# Patient Record
Sex: Male | Born: 1945
Health system: Southern US, Community
[De-identification: ages and names within clinical notes are randomized; demographics above are authoritative.]

## PROBLEM LIST (undated history)

## (undated) ENCOUNTER — Telehealth

## (undated) ENCOUNTER — Encounter: Attending: Internal Medicine | Primary: Internal Medicine

## (undated) ENCOUNTER — Ambulatory Visit: Payer: MEDICARE | Attending: Radiation Oncology | Primary: Radiation Oncology

## (undated) ENCOUNTER — Ambulatory Visit: Payer: MEDICARE

## (undated) ENCOUNTER — Ambulatory Visit: Payer: MEDICARE | Attending: Nurse Practitioner | Primary: Nurse Practitioner

## (undated) ENCOUNTER — Telehealth: Attending: Internal Medicine | Primary: Internal Medicine

## (undated) ENCOUNTER — Encounter

## (undated) ENCOUNTER — Ambulatory Visit: Payer: Medicare (Managed Care)

## (undated) ENCOUNTER — Ambulatory Visit

## (undated) ENCOUNTER — Ambulatory Visit: Payer: MEDICAID

## (undated) ENCOUNTER — Encounter: Payer: MEDICARE | Attending: Internal Medicine | Primary: Internal Medicine

## (undated) ENCOUNTER — Encounter: Attending: Gastroenterology | Primary: Gastroenterology

## (undated) ENCOUNTER — Encounter: Attending: Radiation Oncology | Primary: Radiation Oncology

## (undated) ENCOUNTER — Ambulatory Visit: Payer: Medicare (Managed Care) | Attending: Internal Medicine | Primary: Internal Medicine

## (undated) ENCOUNTER — Ambulatory Visit: Attending: Radiation Oncology | Primary: Radiation Oncology

## (undated) ENCOUNTER — Telehealth: Attending: Radiation Oncology | Primary: Radiation Oncology

## (undated) ENCOUNTER — Other Ambulatory Visit

## (undated) ENCOUNTER — Ambulatory Visit: Attending: Orthopaedic Surgery | Primary: Orthopaedic Surgery

## (undated) ENCOUNTER — Inpatient Hospital Stay: Payer: Medicare (Managed Care)

## (undated) ENCOUNTER — Ambulatory Visit: Payer: MEDICARE | Attending: Medical | Primary: Medical

## (undated) ENCOUNTER — Encounter: Payer: MEDICARE | Attending: Orthopaedic Surgery | Primary: Orthopaedic Surgery

## (undated) ENCOUNTER — Encounter: Attending: Orthopaedic Surgery | Primary: Orthopaedic Surgery

## (undated) ENCOUNTER — Ambulatory Visit: Payer: MEDICARE | Attending: Internal Medicine | Primary: Internal Medicine

## (undated) ENCOUNTER — Telehealth: Attending: Orthopaedic Surgery | Primary: Orthopaedic Surgery

## (undated) ENCOUNTER — Ambulatory Visit: Payer: MEDICAID | Attending: Internal Medicine | Primary: Internal Medicine

## (undated) DIAGNOSIS — M1990 Unspecified osteoarthritis, unspecified site: Secondary | ICD-10-CM

## (undated) DIAGNOSIS — J449 Chronic obstructive pulmonary disease, unspecified: Secondary | ICD-10-CM

## (undated) DIAGNOSIS — Z86010 Personal history of colonic polyps: Secondary | ICD-10-CM

## (undated) DIAGNOSIS — K219 Gastro-esophageal reflux disease without esophagitis: Secondary | ICD-10-CM

## (undated) DIAGNOSIS — Z85118 Personal history of other malignant neoplasm of bronchus and lung: Secondary | ICD-10-CM

## (undated) DIAGNOSIS — I10 Essential (primary) hypertension: Secondary | ICD-10-CM

## (undated) DIAGNOSIS — M79603 Pain in arm, unspecified: Secondary | ICD-10-CM

## (undated) DIAGNOSIS — N189 Chronic kidney disease, unspecified: Secondary | ICD-10-CM

## (undated) DIAGNOSIS — R51 Headache: Secondary | ICD-10-CM

## (undated) DIAGNOSIS — N200 Calculus of kidney: Secondary | ICD-10-CM

## (undated) HISTORY — PX: CHOLECYSTECTOMY: SHX55

## (undated) HISTORY — PX: LOBECTOMY: SHX5089

## (undated) HISTORY — PX: CATARACT EXTRACTION: SUR2

## (undated) HISTORY — PX: HERNIA REPAIR: SHX51

## (undated) HISTORY — PX: COLONOSCOPY: SHX174

---
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
## 2009-03-13 HISTORY — PX: CYSTOSCOPY W/ URETERAL STENT PLACEMENT: SHX1429

## 2010-02-15 ENCOUNTER — Emergency Department: Payer: Self-pay | Admitting: Internal Medicine

## 2010-10-13 ENCOUNTER — Ambulatory Visit: Payer: Self-pay

## 2010-10-15 ENCOUNTER — Emergency Department: Payer: Self-pay | Admitting: Emergency Medicine

## 2010-11-11 ENCOUNTER — Ambulatory Visit: Payer: Self-pay | Admitting: Internal Medicine

## 2010-12-20 ENCOUNTER — Other Ambulatory Visit: Payer: Self-pay | Admitting: Neurosurgery

## 2010-12-20 DIAGNOSIS — M5020 Other cervical disc displacement, unspecified cervical region: Secondary | ICD-10-CM

## 2010-12-20 DIAGNOSIS — M5410 Radiculopathy, site unspecified: Secondary | ICD-10-CM

## 2010-12-21 ENCOUNTER — Ambulatory Visit
Admission: RE | Admit: 2010-12-21 | Discharge: 2010-12-21 | Disposition: A | Discharge status: Home / Self Care | Payer: Medicare Other | Source: Ambulatory Visit | Attending: Neurosurgery | Admitting: Neurosurgery

## 2010-12-21 ENCOUNTER — Ambulatory Visit
Admission: RE | Admit: 2010-12-21 | Discharge: 2010-12-21 | Disposition: A | Discharge status: Home / Self Care | Payer: PRIVATE HEALTH INSURANCE | Source: Ambulatory Visit | Attending: Neurosurgery | Admitting: Neurosurgery

## 2010-12-21 DIAGNOSIS — M5020 Other cervical disc displacement, unspecified cervical region: Secondary | ICD-10-CM

## 2010-12-21 DIAGNOSIS — M5410 Radiculopathy, site unspecified: Secondary | ICD-10-CM

## 2010-12-21 MED ORDER — DIAZEPAM 5 MG PO TABS
5.0000 mg | ORAL_TABLET | Freq: Once | ORAL | Status: AC
  Administered 2010-12-21: 5 mg via ORAL

## 2010-12-21 MED ORDER — IOHEXOL 300 MG/ML  SOLN
10.0000 mL | Freq: Once | INTRAMUSCULAR | Status: AC | PRN
  Administered 2010-12-21: 10 mL via INTRATHECAL

## 2011-01-27 ENCOUNTER — Encounter (HOSPITAL_COMMUNITY): Payer: Self-pay | Admitting: Pharmacy Technician

## 2011-01-31 ENCOUNTER — Encounter (HOSPITAL_COMMUNITY)
Admission: RE | Admit: 2011-01-31 | Discharge: 2011-01-31 | Disposition: A | Discharge status: Home / Self Care | Payer: Medicare Other | Source: Ambulatory Visit | Attending: Neurosurgery | Admitting: Neurosurgery

## 2011-01-31 ENCOUNTER — Encounter (HOSPITAL_COMMUNITY): Payer: Self-pay | Admitting: Vascular Surgery

## 2011-01-31 ENCOUNTER — Encounter (HOSPITAL_COMMUNITY): Payer: Self-pay

## 2011-01-31 ENCOUNTER — Other Ambulatory Visit: Payer: Self-pay

## 2011-01-31 ENCOUNTER — Ambulatory Visit (HOSPITAL_COMMUNITY)
Admission: RE | Admit: 2011-01-31 | Discharge: 2011-01-31 | Disposition: A | Discharge status: Home / Self Care | Payer: Medicare Other | Source: Ambulatory Visit | Attending: Neurosurgery | Admitting: Neurosurgery

## 2011-01-31 DIAGNOSIS — Z01810 Encounter for preprocedural cardiovascular examination: Secondary | ICD-10-CM | POA: Insufficient documentation

## 2011-01-31 DIAGNOSIS — Z01812 Encounter for preprocedural laboratory examination: Secondary | ICD-10-CM | POA: Insufficient documentation

## 2011-01-31 DIAGNOSIS — Z01818 Encounter for other preprocedural examination: Secondary | ICD-10-CM | POA: Insufficient documentation

## 2011-01-31 HISTORY — DX: Essential (primary) hypertension: I10

## 2011-01-31 HISTORY — DX: Calculus of kidney: N20.0

## 2011-01-31 HISTORY — DX: Unspecified osteoarthritis, unspecified site: M19.90

## 2011-01-31 HISTORY — DX: Pain in arm, unspecified: M79.603

## 2011-01-31 HISTORY — DX: Chronic kidney disease, unspecified: N18.9

## 2011-01-31 HISTORY — DX: Headache: R51

## 2011-01-31 HISTORY — DX: Gastro-esophageal reflux disease without esophagitis: K21.9

## 2011-01-31 HISTORY — DX: Personal history of colonic polyps: Z86.010

## 2011-01-31 HISTORY — DX: Personal history of other malignant neoplasm of bronchus and lung: Z85.118

## 2011-01-31 LAB — BASIC METABOLIC PANEL
CO2: 29 mEq/L (ref 19–32)
Calcium: 9.4 mg/dL (ref 8.4–10.5)
Chloride: 101 mEq/L (ref 96–112)
Glucose, Bld: 250 mg/dL — ABNORMAL HIGH (ref 70–99)
Sodium: 139 mEq/L (ref 135–145)

## 2011-01-31 LAB — CBC
HCT: 44.9 % (ref 39.0–52.0)
Hemoglobin: 15.1 g/dL (ref 13.0–17.0)
MCH: 31.3 pg (ref 26.0–34.0)
MCV: 93.2 fL (ref 78.0–100.0)
Platelets: 202 10*3/uL (ref 150–400)
RBC: 4.82 MIL/uL (ref 4.22–5.81)

## 2011-01-31 NOTE — Progress Notes (Signed)
Pt doesn't have a cardiologist;pt is maintained by medical MD for HTN  Thinks he had a stress test many years ago in [facility]

## 2011-01-31 NOTE — Pre-Procedure Instructions (Signed)
20 [name]  01/31/2011   Your procedure is scheduled on:  Thurs,Nov 29 @ 0945  Report to [facility] at 0745 AM.  Call this number if you have problems the morning of surgery: [phone]   Remember:   Do not eat food:After Midnight.  Do not drink clear liquids: 4 Hours before arrival.May have soda,tea,black coffee,apple/grape juice,broth,or water until 3:45am  Take these medicines the morning of surgery with A SIP OF WATER: Atenolol,Gabapentin,Omeprazole   Do not wear jewelry, make-up or nail polish.  Do not wear lotions, powders, or perfumes. You may wear deodorant.  Do not shave 48 hours prior to surgery.  Do not bring valuables to the hospital.  Contacts, dentures or bridgework may not be worn into surgery.  Leave suitcase in the car. After surgery it may be brought to your room.  For patients admitted to the hospital, checkout time is 11:00 AM the day of discharge.   Patients discharged the day of surgery will not be allowed to drive home.  Name and phone number of your driver:   Special Instructions: CHG Shower Use Special Wash: 1/2 bottle night before surgery and 1/2 bottle morning of surgery.   Please read over the following fact sheets that you were given: Pain Booklet, Coughing and Deep Breathing, MRSA Information and Surgical Site Infection Prevention

## 2011-02-01 NOTE — Consult Note (Addendum)
Anesthesia:  Patient is a 65 year old for ACDF.  Other hx includes HTN, lung CA, GERD, DM2, CKD, ureteral stent, and former smoker.  He did not report any known CAD/MI/CHF.  I was asked to evaluate his preoperative EKG showing NSR and non specific T wave abnormality and his CXR.  Currently there are no comparison EKGs.  He does not have a Development worker, international aid.  He told the PAT RN that he may have had a prior stress test several years ago at [facility].  Clinical correlation day of surgery, but if no worrisome CV symptoms then anticipate that he will be okay to proceed.  His CXR findings are most likely compatible with his hx of lung CA s/p lobectomy.  His labs were also reviewed.  He will need a CBG DOS to re-evaluate his glucose level.

## 2011-02-06 ENCOUNTER — Inpatient Hospital Stay: Payer: Self-pay | Admitting: Internal Medicine

## 2011-02-08 MED ORDER — VANCOMYCIN HCL 500 MG IV SOLR
500.0000 mg | Freq: Once | INTRAVENOUS | Status: DC
  Filled 2011-02-08: qty 500

## 2011-02-08 MED ORDER — TOBRAMYCIN SULFATE 80 MG/2ML IJ SOLN
80.0000 mg | Freq: Once | INTRAVENOUS | Status: DC
  Filled 2011-02-08: qty 2

## 2011-02-09 ENCOUNTER — Encounter (HOSPITAL_COMMUNITY): Admission: RE | Payer: Self-pay | Source: Ambulatory Visit

## 2011-02-09 ENCOUNTER — Inpatient Hospital Stay (HOSPITAL_COMMUNITY): Admission: RE | Admit: 2011-02-09 | Payer: Medicare Other | Source: Ambulatory Visit | Admitting: Neurosurgery

## 2011-02-09 SURGERY — ANTERIOR CERVICAL DECOMPRESSION/DISCECTOMY FUSION 2 LEVELS
Anesthesia: General

## 2011-02-17 ENCOUNTER — Encounter (HOSPITAL_COMMUNITY)
Admission: RE | Disposition: A | Discharge status: Home / Self Care | Payer: Self-pay | Source: Ambulatory Visit | Attending: Neurosurgery

## 2011-02-17 ENCOUNTER — Inpatient Hospital Stay (HOSPITAL_COMMUNITY): Payer: PRIVATE HEALTH INSURANCE

## 2011-02-17 ENCOUNTER — Encounter (HOSPITAL_COMMUNITY): Payer: Self-pay | Admitting: Anesthesiology

## 2011-02-17 ENCOUNTER — Inpatient Hospital Stay (HOSPITAL_COMMUNITY)
Admission: RE | Admit: 2011-02-17 | Discharge: 2011-02-18 | DRG: 473 | Disposition: A | Discharge status: Home / Self Care | Payer: PRIVATE HEALTH INSURANCE | Source: Ambulatory Visit | Attending: Neurosurgery | Admitting: Neurosurgery

## 2011-02-17 ENCOUNTER — Inpatient Hospital Stay (HOSPITAL_COMMUNITY): Payer: PRIVATE HEALTH INSURANCE | Admitting: Anesthesiology

## 2011-02-17 ENCOUNTER — Encounter (HOSPITAL_COMMUNITY): Payer: Self-pay | Admitting: *Deleted

## 2011-02-17 DIAGNOSIS — Z85118 Personal history of other malignant neoplasm of bronchus and lung: Secondary | ICD-10-CM

## 2011-02-17 DIAGNOSIS — N189 Chronic kidney disease, unspecified: Secondary | ICD-10-CM | POA: Diagnosis present

## 2011-02-17 DIAGNOSIS — I129 Hypertensive chronic kidney disease with stage 1 through stage 4 chronic kidney disease, or unspecified chronic kidney disease: Secondary | ICD-10-CM | POA: Diagnosis present

## 2011-02-17 DIAGNOSIS — M5020 Other cervical disc displacement, unspecified cervical region: Principal | ICD-10-CM | POA: Diagnosis present

## 2011-02-17 DIAGNOSIS — E119 Type 2 diabetes mellitus without complications: Secondary | ICD-10-CM | POA: Diagnosis present

## 2011-02-17 HISTORY — PX: ANTERIOR CERVICAL DECOMP/DISCECTOMY FUSION: SHX1161

## 2011-02-17 LAB — GLUCOSE, CAPILLARY
Glucose-Capillary: 141 mg/dL — ABNORMAL HIGH (ref 70–99)
Glucose-Capillary: 166 mg/dL — ABNORMAL HIGH (ref 70–99)

## 2011-02-17 SURGERY — ANTERIOR CERVICAL DECOMPRESSION/DISCECTOMY FUSION 2 LEVELS
Anesthesia: General | Site: Neck | Wound class: Clean

## 2011-02-17 MED ORDER — THROMBIN 20000 UNITS EX KIT
PACK | CUTANEOUS | Status: DC | PRN
  Administered 2011-02-17: 13:00:00 via TOPICAL

## 2011-02-17 MED ORDER — ATENOLOL 25 MG PO TABS: 25.0000 mg | ORAL_TABLET | Freq: Every day | ORAL | Status: DC

## 2011-02-17 MED ORDER — MENTHOL 3 MG MT LOZG
1.0000 | LOZENGE | OROMUCOSAL | Status: DC | PRN
  Filled 2011-02-17: qty 9

## 2011-02-17 MED ORDER — PROMETHAZINE HCL 25 MG/ML IJ SOLN: 6.2500 mg | INTRAMUSCULAR | Status: DC | PRN

## 2011-02-17 MED ORDER — METHOCARBAMOL 500 MG PO TABS: 500.0000 mg | ORAL_TABLET | Freq: Four times a day (QID) | ORAL | Status: DC | PRN

## 2011-02-17 MED ORDER — DEXAMETHASONE SODIUM PHOSPHATE 4 MG/ML IJ SOLN
4.0000 mg | Freq: Four times a day (QID) | INTRAMUSCULAR | Status: DC
  Filled 2011-02-17 (×4): qty 1

## 2011-02-17 MED ORDER — GLIPIZIDE 10 MG PO TABS
20.0000 mg | ORAL_TABLET | Freq: Two times a day (BID) | ORAL | Status: DC
  Administered 2011-02-18: 20 mg via ORAL
  Filled 2011-02-17 (×3): qty 2

## 2011-02-17 MED ORDER — ACETAMINOPHEN 325 MG PO TABS: 650.0000 mg | ORAL_TABLET | ORAL | Status: DC | PRN

## 2011-02-17 MED ORDER — LIDOCAINE HCL (CARDIAC) 20 MG/ML IV SOLN
INTRAVENOUS | Status: DC | PRN
  Administered 2011-02-17: 75 mg via INTRAVENOUS

## 2011-02-17 MED ORDER — FENTANYL CITRATE 0.05 MG/ML IJ SOLN
INTRAMUSCULAR | Status: DC | PRN
  Administered 2011-02-17 (×2): 50 ug via INTRAVENOUS
  Administered 2011-02-17: 150 ug via INTRAVENOUS

## 2011-02-17 MED ORDER — ACETAMINOPHEN 650 MG RE SUPP: 650.0000 mg | RECTAL | Status: DC | PRN

## 2011-02-17 MED ORDER — SODIUM CHLORIDE 0.9 % IR SOLN
Status: DC | PRN
  Administered 2011-02-17: 1000 mL

## 2011-02-17 MED ORDER — ONDANSETRON HCL 4 MG/2ML IJ SOLN: 4.0000 mg | INTRAMUSCULAR | Status: DC | PRN

## 2011-02-17 MED ORDER — HYDROMORPHONE HCL PF 1 MG/ML IJ SOLN
0.2500 mg | INTRAMUSCULAR | Status: DC | PRN
  Administered 2011-02-17 (×2): 0.25 mg via INTRAVENOUS
  Administered 2011-02-17 (×2): 0.5 mg via INTRAVENOUS

## 2011-02-17 MED ORDER — HYDROCHLOROTHIAZIDE 25 MG PO TABS
25.0000 mg | ORAL_TABLET | Freq: Every day | ORAL | Status: DC
  Administered 2011-02-17: 25 mg via ORAL
  Filled 2011-02-17 (×2): qty 1

## 2011-02-17 MED ORDER — GLIPIZIDE 10 MG PO TABS
20.0000 mg | ORAL_TABLET | Freq: Two times a day (BID) | ORAL | Status: DC
  Filled 2011-02-17: qty 2

## 2011-02-17 MED ORDER — HYDROMORPHONE HCL PF 1 MG/ML IJ SOLN: 0.2500 mg | INTRAMUSCULAR | Status: DC | PRN

## 2011-02-17 MED ORDER — VANCOMYCIN HCL 500 MG IV SOLR
500.0000 mg | INTRAVENOUS | Status: AC
  Administered 2011-02-17: 500 mg via INTRAVENOUS
  Filled 2011-02-17: qty 500

## 2011-02-17 MED ORDER — ATENOLOL 50 MG PO TABS
50.0000 mg | ORAL_TABLET | Freq: Every day | ORAL | Status: DC
  Administered 2011-02-17: 50 mg via ORAL
  Filled 2011-02-17 (×2): qty 1

## 2011-02-17 MED ORDER — SODIUM CHLORIDE 0.9 % IV SOLN: 250.0000 mL | INTRAVENOUS | Status: DC

## 2011-02-17 MED ORDER — HYDROMORPHONE HCL PF 1 MG/ML IJ SOLN
1.0000 mg | INTRAMUSCULAR | Status: DC | PRN
  Administered 2011-02-17: 1.5 mg via INTRAMUSCULAR
  Filled 2011-02-17: qty 2

## 2011-02-17 MED ORDER — EPHEDRINE SULFATE 50 MG/ML IJ SOLN
INTRAMUSCULAR | Status: DC | PRN
  Administered 2011-02-17: 10 mg via INTRAVENOUS

## 2011-02-17 MED ORDER — LACTATED RINGERS IV SOLN: INTRAVENOUS | Status: DC | PRN

## 2011-02-17 MED ORDER — PHENYLEPHRINE HCL 10 MG/ML IJ SOLN
10.0000 mg | INTRAVENOUS | Status: DC | PRN
  Administered 2011-02-17: 20 ug/min via INTRAVENOUS

## 2011-02-17 MED ORDER — VECURONIUM BROMIDE 10 MG IV SOLR
INTRAVENOUS | Status: DC | PRN
  Administered 2011-02-17: 8 mg via INTRAVENOUS

## 2011-02-17 MED ORDER — PANTOPRAZOLE SODIUM 40 MG PO TBEC: 40.0000 mg | DELAYED_RELEASE_TABLET | Freq: Every day | ORAL | Status: DC

## 2011-02-17 MED ORDER — GABAPENTIN 100 MG PO CAPS
100.0000 mg | ORAL_CAPSULE | Freq: Three times a day (TID) | ORAL | Status: DC
  Administered 2011-02-17: 100 mg via ORAL
  Filled 2011-02-17 (×4): qty 1

## 2011-02-17 MED ORDER — SODIUM CHLORIDE 0.9 % IR SOLN: Status: DC | PRN

## 2011-02-17 MED ORDER — METHOCARBAMOL 100 MG/ML IJ SOLN
500.0000 mg | Freq: Four times a day (QID) | INTRAVENOUS | Status: DC | PRN
  Filled 2011-02-17: qty 5

## 2011-02-17 MED ORDER — SODIUM CHLORIDE 0.9 % IJ SOLN: 3.0000 mL | Freq: Two times a day (BID) | INTRAMUSCULAR | Status: DC

## 2011-02-17 MED ORDER — DEXTROSE 5 % IV SOLN
INTRAVENOUS | Status: DC | PRN
  Administered 2011-02-17 (×2): via INTRAVENOUS

## 2011-02-17 MED ORDER — HYDROCODONE-ACETAMINOPHEN 5-325 MG PO TABS
1.0000 | ORAL_TABLET | ORAL | Status: DC | PRN
  Administered 2011-02-17: 1 via ORAL
  Administered 2011-02-18: 2 via ORAL
  Filled 2011-02-17 (×3): qty 1

## 2011-02-17 MED ORDER — TOBRAMYCIN SULFATE 80 MG/2ML IJ SOLN
80.0000 mg | INTRAVENOUS | Status: AC
  Administered 2011-02-17: 80 mg via INTRAVENOUS
  Filled 2011-02-17: qty 2

## 2011-02-17 MED ORDER — THROMBIN 5000 UNITS EX KIT
PACK | CUTANEOUS | Status: DC | PRN
  Administered 2011-02-17 (×2): 5000 [IU] via TOPICAL

## 2011-02-17 MED ORDER — ATENOLOL 25 MG PO TABS
25.0000 mg | ORAL_TABLET | Freq: Every day | ORAL | Status: DC
  Filled 2011-02-17: qty 1

## 2011-02-17 MED ORDER — SODIUM CHLORIDE 0.9 % IR SOLN
Status: DC | PRN
  Administered 2011-02-17: 13:00:00

## 2011-02-17 MED ORDER — AMLODIPINE BESYLATE 2.5 MG PO TABS
2.5000 mg | ORAL_TABLET | Freq: Every day | ORAL | Status: DC
  Administered 2011-02-17: 2.5 mg via ORAL
  Filled 2011-02-17 (×2): qty 1

## 2011-02-17 MED ORDER — SODIUM CHLORIDE 0.9 % IJ SOLN: 3.0000 mL | INTRAMUSCULAR | Status: DC | PRN

## 2011-02-17 MED ORDER — DIPHENHYDRAMINE HCL 50 MG/ML IJ SOLN
50.0000 mg | Freq: Once | INTRAMUSCULAR | Status: DC
  Filled 2011-02-17: qty 1

## 2011-02-17 MED ORDER — MEPERIDINE HCL 25 MG/ML IJ SOLN: 6.2500 mg | INTRAMUSCULAR | Status: DC | PRN

## 2011-02-17 MED ORDER — HEMOSTATIC AGENTS (NO CHARGE) OPTIME
TOPICAL | Status: DC | PRN
  Administered 2011-02-17: 1 via TOPICAL

## 2011-02-17 MED ORDER — DEXAMETHASONE SODIUM PHOSPHATE 10 MG/ML IJ SOLN
10.0000 mg | Freq: Once | INTRAMUSCULAR | Status: AC
  Administered 2011-02-17: 10 mg via INTRAVENOUS
  Filled 2011-02-17: qty 1

## 2011-02-17 MED ORDER — DEXAMETHASONE 4 MG PO TABS
4.0000 mg | ORAL_TABLET | Freq: Four times a day (QID) | ORAL | Status: DC
  Administered 2011-02-17 – 2011-02-18 (×3): 4 mg via ORAL
  Filled 2011-02-17 (×7): qty 1

## 2011-02-17 MED ORDER — ENALAPRIL MALEATE 10 MG PO TABS
10.0000 mg | ORAL_TABLET | Freq: Every day | ORAL | Status: DC
  Administered 2011-02-17: 10 mg via ORAL
  Filled 2011-02-17 (×2): qty 1

## 2011-02-17 MED ORDER — LACTATED RINGERS IV SOLN
INTRAVENOUS | Status: DC | PRN
  Administered 2011-02-17 (×2): via INTRAVENOUS

## 2011-02-17 MED ORDER — VANCOMYCIN HCL IN DEXTROSE 1-5 GM/200ML-% IV SOLN
1000.0000 mg | Freq: Once | INTRAVENOUS | Status: AC
  Administered 2011-02-17: 1000 mg via INTRAVENOUS
  Filled 2011-02-17: qty 200

## 2011-02-17 MED ORDER — METFORMIN HCL 500 MG PO TABS
500.0000 mg | ORAL_TABLET | Freq: Two times a day (BID) | ORAL | Status: DC
  Administered 2011-02-17 – 2011-02-18 (×2): 500 mg via ORAL
  Filled 2011-02-17 (×4): qty 1

## 2011-02-17 MED ORDER — ENALAPRIL-HYDROCHLOROTHIAZIDE 10-25 MG PO TABS: 1.0000 | ORAL_TABLET | Freq: Every day | ORAL | Status: DC

## 2011-02-17 MED ORDER — MIDAZOLAM HCL 5 MG/5ML IJ SOLN
INTRAMUSCULAR | Status: DC | PRN
  Administered 2011-02-17: 2 mg via INTRAVENOUS

## 2011-02-17 MED ORDER — POTASSIUM CHLORIDE IN NACL 20-0.9 MEQ/L-% IV SOLN
INTRAVENOUS | Status: DC
  Filled 2011-02-17 (×3): qty 1000

## 2011-02-17 MED ORDER — PHENOL 1.4 % MT LIQD: 1.0000 | OROMUCOSAL | Status: DC | PRN

## 2011-02-17 MED ORDER — PROPOFOL 10 MG/ML IV EMUL
INTRAVENOUS | Status: DC | PRN
  Administered 2011-02-17: 150 mg via INTRAVENOUS

## 2011-02-17 SURGICAL SUPPLY — 57 items
BAG DECANTER FOR FLEXI CONT (MISCELLANEOUS) ×2 IMPLANT
BENZOIN TINCTURE PRP APPL 2/3 (GAUZE/BANDAGES/DRESSINGS) ×2 IMPLANT
BRUSH SCRUB EZ PLAIN DRY (MISCELLANEOUS) ×2 IMPLANT
CAGE TM-S 11X14X8 (Cage) ×2 IMPLANT
CANISTER SUCTION 2500CC (MISCELLANEOUS) ×2 IMPLANT
CLOSURE STERI STRIP 1/2 X4 (GAUZE/BANDAGES/DRESSINGS) ×2 IMPLANT
CLOTH BEACON ORANGE TIMEOUT ST (SAFETY) ×2 IMPLANT
CONT SPEC 4OZ CLIKSEAL STRL BL (MISCELLANEOUS) ×2 IMPLANT
DRAPE C-ARM 42X72 X-RAY (DRAPES) ×4 IMPLANT
DRAPE LAPAROTOMY 100X72 PEDS (DRAPES) ×2 IMPLANT
DRAPE MICROSCOPE ZEISS OPMI (DRAPES) ×2 IMPLANT
DRAPE SURG 17X23 STRL (DRAPES) ×4 IMPLANT
DRESSING TELFA 8X3 (GAUZE/BANDAGES/DRESSINGS) ×2 IMPLANT
ELECT COATED BLADE 2.86 ST (ELECTRODE) ×2 IMPLANT
ELECT REM PT RETURN 9FT ADLT (ELECTROSURGICAL) ×2
ELECTRODE REM PT RTRN 9FT ADLT (ELECTROSURGICAL) ×1 IMPLANT
GAUZE SPONGE 4X4 16PLY XRAY LF (GAUZE/BANDAGES/DRESSINGS) IMPLANT
GLOVE BIO SURGEON STRL SZ8 (GLOVE) ×2 IMPLANT
GLOVE BIOGEL PI IND STRL 7.0 (GLOVE) ×1 IMPLANT
GLOVE BIOGEL PI IND STRL 8 (GLOVE) ×1 IMPLANT
GLOVE BIOGEL PI INDICATOR 7.0 (GLOVE) ×1
GLOVE BIOGEL PI INDICATOR 8 (GLOVE) ×1
GLOVE ECLIPSE 7.5 STRL STRAW (GLOVE) ×4 IMPLANT
GLOVE EXAM NITRILE LRG STRL (GLOVE) IMPLANT
GLOVE EXAM NITRILE XL STR (GLOVE) IMPLANT
GLOVE EXAM NITRILE XS STR PU (GLOVE) ×2 IMPLANT
GLOVE SURG SS PI 6.5 STRL IVOR (GLOVE) ×4 IMPLANT
GOWN BRE IMP SLV AUR LG STRL (GOWN DISPOSABLE) ×2 IMPLANT
GOWN BRE IMP SLV AUR XL STRL (GOWN DISPOSABLE) ×2 IMPLANT
GOWN STRL REIN 2XL LVL4 (GOWN DISPOSABLE) ×2 IMPLANT
HEAD HALTER (SOFTGOODS) ×2 IMPLANT
INVIZIA 12MM DRILL BIT ×2 IMPLANT
KIT BASIN OR (CUSTOM PROCEDURE TRAY) ×2 IMPLANT
KIT ROOM TURNOVER OR (KITS) ×2 IMPLANT
NEEDLE SPNL 20GX3.5 QUINCKE YW (NEEDLE) ×2 IMPLANT
NS IRRIG 1000ML POUR BTL (IV SOLUTION) ×2 IMPLANT
PACK LAMINECTOMY NEURO (CUSTOM PROCEDURE TRAY) ×2 IMPLANT
PAD ARMBOARD 7.5X6 YLW CONV (MISCELLANEOUS) ×4 IMPLANT
PATTIES SURGICAL .25X.25 (GAUZE/BANDAGES/DRESSINGS) IMPLANT
PATTIES SURGICAL .75X.75 (GAUZE/BANDAGES/DRESSINGS) IMPLANT
PUTTY BONE GRAFT KIT 2.5ML (Bone Implant) ×2 IMPLANT
RUBBERBAND STERILE (MISCELLANEOUS) ×4 IMPLANT
SPACER TMS PARAL FUS 11X14 7M (Spacer) ×2 IMPLANT
SPONGE GAUZE 4X4 12PLY (GAUZE/BANDAGES/DRESSINGS) ×2 IMPLANT
SPONGE INTESTINAL PEANUT (DISPOSABLE) ×2 IMPLANT
SPONGE SURGIFOAM ABS GEL 100 (HEMOSTASIS) ×2 IMPLANT
SPONGE SURGIFOAM ABS GEL SZ50 (HEMOSTASIS) ×2 IMPLANT
STRIP CLOSURE SKIN 1/2X4 (GAUZE/BANDAGES/DRESSINGS) ×2 IMPLANT
SUT PDS AB 5-0 P3 18 (SUTURE) ×2 IMPLANT
SUT VIC AB 3-0 CP2 18 (SUTURE) ×2 IMPLANT
SYR 20ML ECCENTRIC (SYRINGE) ×2 IMPLANT
TAPE CLOTH SURG 4X10 WHT LF (GAUZE/BANDAGES/DRESSINGS) ×2 IMPLANT
TOOL MATCHSTK 3MM (MISCELLANEOUS) ×2 IMPLANT
TOWEL OR 17X24 6PK STRL BLUE (TOWEL DISPOSABLE) ×2 IMPLANT
TOWEL OR 17X26 10 PK STRL BLUE (TOWEL DISPOSABLE) ×2 IMPLANT
TRAP SPECIMEN MUCOUS 40CC (MISCELLANEOUS) ×2 IMPLANT
WATER STERILE IRR 1000ML POUR (IV SOLUTION) ×2 IMPLANT

## 2011-02-17 NOTE — Anesthesia Preprocedure Evaluation (Addendum)
Anesthesia Evaluation  Patient identified by MRN, date of birth, ID band Patient awake    Reviewed: Allergy & Precautions, H&P , NPO status   History of Anesthesia Complications Negative for: history of anesthetic complications  Airway Mallampati: II  Neck ROM: Full    Dental  (+) Edentulous Upper and Edentulous Lower   Pulmonary COPDformer smoker Lung cancer history clear to auscultation        Cardiovascular hypertension, Pt. on medications Regular Normal    Neuro/Psych  Headaches,    GI/Hepatic GERD-  ,  Endo/Other  Well Controlled, Type 2, Oral Hypoglycemic AgentsCBG 141  Renal/GU      Musculoskeletal   Abdominal (+) obese,   Peds  Hematology   Anesthesia Other Findings   Reproductive/Obstetrics                          Anesthesia Physical Anesthesia Plan  ASA: II  Anesthesia Plan: General   Post-op Pain Management:    Induction: Intravenous  Airway Management Planned: Oral ETT  Additional Equipment:   Intra-op Plan:   Post-operative Plan: Extubation in OR  Informed Consent: I have reviewed the patients History and Physical, chart, labs and discussed the procedure including the risks, benefits and alternatives for the proposed anesthesia with the patient or authorized representative who has indicated his/her understanding and acceptance.   Dental advisory given  Plan Discussed with: CRNA and Surgeon  Anesthesia Plan Comments:         Anesthesia Quick Evaluation

## 2011-02-17 NOTE — Preoperative (Signed)
Beta Blockers   Took Atenolol @ 5:30 am today

## 2011-02-17 NOTE — H&P (Signed)
[name] is an 65 y.o. male.   Chief Complaint: Left arm pain HPI: The patient is a 65 year old gentleman who presented with left arm pain. He was tried a right conservative therapy without improvement. He underwent myelography which showed abnormalities at C5-6 and C6-7 on the left symptomatic side. After discussing the options he requested surgical intervention and is admitted at this time for a two-level anterior cervical discectomy with fusion. I had a long discussion with [name] regarding all the risks and benefits of surgical intervention. The risks discussed include but are not limited to bleeding infection weakness numbness paralysis spinal fluid leak, and death. We have discussed alternative methods of therapy along with the risks and benefits of nonintervention. [name] has had the opportunity to rest numerous questions and appears to understand. With this information in hand he has requested that we proceed with surgical intervention and he is admitted this time for a two-level procedure.  Past Medical History  Diagnosis Date  . Hypertension   . H/O: lung cancer   . Headache   . Arm pain     r/t neck issues-bilateral  . Arthritis   . GERD (gastroesophageal reflux disease)     takes Omeprazole daily  . History of colon polyps   . Chronic kidney disease     slow to start when urinating  . Kidney stones     hx of  . Diabetes mellitus     Metformin and Glipizide    Past Surgical History  Procedure Date  . Hernia repair     as a child  . Cholecystectomy 90's  . Lobectomy 90's    left lower lobe d/t lung cancer  . Cataract extraction 4-64yrs ago    bilateral  . Colonoscopy   . Cystoscopy w/ ureteral stent placement 2011    Family History  Problem Relation Age of Onset  . Anesthesia problems Neg Hx   . Hypotension Neg Hx   . Malignant hyperthermia Neg Hx   . Pseudochol deficiency Neg Hx    Social History:  reports that he has quit smoking. His smokeless  tobacco use includes Chew. He reports that he does not drink alcohol or use illicit drugs.  Allergies: No Known Allergies  Medications Prior to Admission  Medication Dose Route Frequency Provider Last Rate Last Dose  . dexamethasone (DECADRON) injection 10 mg  10 mg Intravenous Once Yahoo      . diphenhydrAMINE (BENADRYL) injection 50 mg  50 mg Intravenous Once Yahoo      . tobramycin (NEBCIN) 80 mg in dextrose 5 % 50 mL IVPB  80 mg Intravenous To 282 [name] O [name]      . vancomycin (VANCOCIN) 500 mg in sodium chloride 0.9 % 100 mL IVPB  500 mg Intravenous To 282 [name] O [name]       Medications Prior to Admission  Medication Sig Dispense Refill  . amLODipine (NORVASC) 2.5 MG tablet Take 2.5 mg by mouth at bedtime.        [name] atenolol (TENORMIN) 50 MG tablet Take 25-50 mg by mouth daily. Takes 1/2 tab (25 mg) in the morning, and 1 tablet (50 mg) at bedtime      . enalapril-hydrochlorothiazide (VASERETIC) 10-25 MG per tablet Take 1 tablet by mouth daily.        [name] gabapentin (NEURONTIN) 100 MG capsule Take 100 mg by mouth 3 (three) times daily.        [name] glipiZIDE (  GLUCOTROL) 10 MG tablet Take 20 mg by mouth 2 (two) times daily before a meal.        . metFORMIN (GLUCOPHAGE) 500 MG tablet Take 500 mg by mouth 2 (two) times daily with a meal.        . omeprazole (PRILOSEC) 20 MG capsule Take 20 mg by mouth daily.        [name] oxyCODONE-acetaminophen (PERCOCET) 5-325 MG per tablet Take 1 tablet by mouth daily as needed. For pain        No results found for this or any previous visit (from the past 48 hour(s)). No results found.  Pertinent items are noted in HPI.  Blood pressure 133/85, pulse 70, temperature 98.3 F (36.8 C), temperature source Oral, resp. rate 18, SpO2 96.00%.  Normal except for some mild weakness of the left upper extremity. Assessment/Plan Cervical disc disease C56 C 67. Now for 2 level ACDF with plating.  Michael Meeker, MD 02/17/2011, 11:16 AM

## 2011-02-17 NOTE — Anesthesia Procedure Notes (Signed)
Procedure Name: Intubation Date/Time: 02/17/2011 11:42 AM Performed by: Tyrone Nine Pre-anesthesia Checklist: Patient identified, Emergency Drugs available, Suction available and Patient being monitored Patient Re-evaluated:Patient Re-evaluated prior to inductionOxygen Delivery Method: Circle System Utilized Preoxygenation: Pre-oxygenation with 100% oxygen Intubation Type: IV induction Ventilation: Mask ventilation without difficulty Laryngoscope Size: Mac and 3 Grade View: Grade I Tube type: Oral Tube size: 8.0 mm Number of attempts: 1 Placement Confirmation: ETT inserted through vocal cords under direct vision,  breath sounds checked- equal and bilateral and positive ETCO2 Secured at: 21 cm Tube secured with: Tape Dental Injury: Teeth and Oropharynx as per pre-operative assessment

## 2011-02-17 NOTE — Anesthesia Postprocedure Evaluation (Signed)
  Anesthesia Post-op Note  Patient: [name]  Procedure(s) Performed:  ANTERIOR CERVICAL DECOMPRESSION/DISCECTOMY FUSION 2 LEVELS - Anterior Cervical Five-Six, Six-Seven Decompression and Fusion  Patient Location: PACU  Anesthesia Type: General  Level of Consciousness: awake  Airway and Oxygen Therapy: Patient Spontanous Breathing  Post-op Pain: mild  Post-op Assessment: Post-op Vital signs reviewed  Post-op Vital Signs: stable  Complications: No apparent anesthesia complications

## 2011-02-17 NOTE — OR Nursing (Signed)
Preop pt states B arm pain. L>R.

## 2011-02-17 NOTE — Brief Op Note (Signed)
02/17/2011  1:53 PM  PATIENT:  [name]  65 y.o. male  PRE-OPERATIVE DIAGNOSIS:  Cervical five-six, six-seven herniated nucleus pulposus  POST-OPERATIVE DIAGNOSIS:  Cervical five-six, six-seven herniated nucleus pulposus  PROCEDURE:  Procedure(s): ANTERIOR CERVICAL DECOMPRESSION/DISCECTOMY FUSION 2 LEVELS  SURGEON:  Surgeon(s): Lanny Donoso O Mathias Bogacki Tia Alert  PHYSICIAN ASSISTANT:   ASSISTANTS: Yetta Barre   ANESTHESIA:   general  EBL:  Total I/O In: 1100 [I.V.:1100] Out: 50 [Blood:50]  BLOOD ADMINISTERED:none  DRAINS: none   LOCAL MEDICATIONS USED:  NONE  SPECIMEN:  No Specimen  DISPOSITION OF SPECIMEN:  N/A  COUNTS:  YES  TOURNIQUET:  * No tourniquets in log *  DICTATION: .Dragon Dictation  PLAN OF CARE: Admit to inpatient   PATIENT DISPOSITION:  PACU - hemodynamically stable.   Delay start of Pharmacological VTE agent (>24hrs) due to surgical blood loss or risk of bleeding:  {YES/NO/NOT APPLICABLE:20182

## 2011-02-17 NOTE — Transfer of Care (Signed)
Immediate Anesthesia Transfer of Care Note  Patient: [name]  Procedure(s) Performed:  ANTERIOR CERVICAL DECOMPRESSION/DISCECTOMY FUSION 2 LEVELS - Anterior Cervical Five-Six, Six-Seven Decompression and Fusion  Patient Location: PACU  Anesthesia Type: General  Level of Consciousness: awake, alert , oriented and sedated  Airway & Oxygen Therapy: Patient Spontanous Breathing and Patient connected to nasal cannula oxygen  Post-op Assessment: Report given to PACU RN and Post -op Vital signs reviewed and stable  Post vital signs: Reviewed and stable  Complications: No apparent anesthesia complications

## 2011-02-17 NOTE — Op Note (Signed)
Preop diagnosis: HNP C5-6 C6-7 Postop diagnosis: Same Procedure: C5-6 C6-7 anterior cervical discectomy with trabecular metal fusion and trinica plating Surgeon: Magdaleno Lortie Assistant: Yetta Barre  After being placed in the supine position and 10 pounds halter traction the patient's neck was prepped and draped in the usual sterile fashion. Localizing fluoroscopy was used prior to incision to identify the appropriate level. Transverse incision was made in the right anterior neck starting at the midline and headed towards the medial aspect of the sternocleidomastoid muscle. The platysma muscle was incised transversely. The natural fascial plane between the strap muscles medially and the sternocleidomastoid muscle laterally was identified and followed down to the anterior aspect of the cervical spine. Longus coli muscles were identified and split in the midline and stripped bilaterally with unipolar coagulation and Barista. Self retaining retractor was placed for exposure and x-ray showed approach the appropriate level. Using a 15 blade the and was of the disc at C5-6 and C6-7 was incised. Using pituitary rongeurs and curettes approximately 90% of the disc material was removed. High-speed drill was used to widen the interspace and bony shavings were saved for use later in the case. Microscope was draped brought into the field and used for the remainder of the case. Starting at C6-7 the remainder of the disc material down to the posterior longitudinal ligament was removed. It was then incised transversely and the cut edges removed with the Kerrison punch. Thorough decompression was carried out on the spinal dura and foramen bilaterally until the C7 nerve roots were well visualized and decompressed. Irrigation was carried out attention was turned to C5-6. Similar procedure was carried out at this level. Once again the remainder of the disc material down to the posterior longitudinal ligament was removed. Once again  ligament was incised and for decompression was carried out of the spinal dura and foramen bilaterally. At this time inspection was carried out bilaterally at both levels for any evidence of residual compression and none could be identified. Large amounts of irrigation were carried out. Any bleeding was controlled with bipolar coagulation and Gelfoam. Measurements were taken and a 7 mm and 8 mm trabecular metal graft was then chosen. The center of the graft was filled with a mixture of autologous bone and morselized allograft. After irrigated once more and confirming hemostasis the 7 mm plug was impacted C5-6 and the 8 mm graft at C6-7. An appropriately length anterior cervical plate was then chosen. Drill holes were then placed followed by placing of 14 mm screws x6. Final fluoroscopy goat showed good position of the plates screws and plugs. Irrigation was carried out and any bleeding controlled with bipolar coagulation. The wound was then closed with inverted Vicryl on the platysma muscle inverted 5-0 PDS in the subcuticular layer and Steri-Strips on the skin. A sterile dressing and soft collar were applied and the patient was extubated and taken to recovery room in stable condition.

## 2011-02-17 NOTE — Progress Notes (Signed)
Patient's post op w/o any drain per RN. [name] can do one dose of vancomycin 1000mg  iv x1 at 2330 tonite.  Had 500mg  iv x1 at 1135 today.

## 2011-02-17 NOTE — Progress Notes (Signed)
NO NEED TO REPEAT LABS FROM 01-31-2011,OKAY TO USE THESE FOR SURGERY TODAY

## 2011-02-18 LAB — GLUCOSE, CAPILLARY: Glucose-Capillary: 255 mg/dL — ABNORMAL HIGH (ref 70–99)

## 2011-02-18 NOTE — Discharge Summary (Signed)
  Physician Discharge Summary  Patient ID: [name] MRN: [id number] DOB/AGE: 67-Jul-1947 59 y.o.  Admit date: 02/17/2011 Discharge date: 02/18/2011  Admission Diagnoses:  Discharge Diagnoses:  Principal Problem:  *Herniated cervical disc without myelopathy   Discharged Condition: good  Hospital Course: Surgery Friday. Home Saturday. No new issues. Did well  Consults: none  Significant Diagnostic Studies: None  Treatments: surgery: ACDF C56 C67  Discharge Exam: Blood pressure 109/69, pulse 77, temperature 98 F (36.7 C), temperature source Oral, resp. rate 20, weight 74.8 kg (164 lb 14.5 oz), SpO2 90.00%. Incision/Wound:Healing well  Disposition: Final discharge disposition not confirmed  Discharge Orders    Future Orders Please Complete By Expires   Diet general      Discharge instructions      Comments:   Mostly bedrest. Get up 9 or 10 times each day and walk for 15-20 minutes each time. Very little sitting the first week. No riding in the car until your first post op appointment. If you had neck surgery...may shower from the chest down. If you had low back surgery....you may shower with a saran wrap covering over the incision. Take your pain medicine as needed...and other medicines that you are instructed to take. Call for an appointment...[phone].   Call MD for:  temperature >100.4      Call MD for:  persistant nausea and vomiting      Call MD for:  severe uncontrolled pain      Call MD for:  redness, tenderness, or signs of infection (pain, swelling, redness, odor or green/yellow discharge around incision site)      Call MD for:  difficulty breathing, headache or visual disturbances      Call MD for:  hives        Current Discharge Medication List    CONTINUE these medications which have NOT CHANGED   Details  amLODipine (NORVASC) 2.5 MG tablet Take 2.5 mg by mouth at bedtime.      atenolol (TENORMIN) 50 MG tablet Take 25-50 mg by mouth daily. Takes 1/2 tab  (25 mg) in the morning, and 1 tablet (50 mg) at bedtime    enalapril-hydrochlorothiazide (VASERETIC) 10-25 MG per tablet Take 1 tablet by mouth daily.      gabapentin (NEURONTIN) 100 MG capsule Take 100 mg by mouth 3 (three) times daily.      glipiZIDE (GLUCOTROL) 10 MG tablet Take 20 mg by mouth 2 (two) times daily before a meal.      metFORMIN (GLUCOPHAGE) 500 MG tablet Take 500 mg by mouth 2 (two) times daily with a meal.      omeprazole (PRILOSEC) 20 MG capsule Take 20 mg by mouth daily.      oxyCODONE-acetaminophen (PERCOCET) 5-325 MG per tablet Take 1 tablet by mouth daily as needed. For pain         At home rest most of the time. Get up 9 or 10 times each day and take a 15 or 20 minute walk. No riding in the car and to your first postoperative appointment. If you have neck surgery you may shower from the chest down starting on the third postoperative day. If you had back surgery he may start showering on the third postoperative day with saran wrap wrapped around your incisional area 3 times. After the shower remove the saran wrap. Take pain medicine as needed and other medications as instructed. Call my office for an appointment.  SignedReinaldo Meeker, MD 02/18/2011, 8:27 AM

## 2011-02-21 ENCOUNTER — Encounter (HOSPITAL_COMMUNITY): Payer: Self-pay | Admitting: Neurosurgery

## 2011-03-08 ENCOUNTER — Other Ambulatory Visit: Payer: Self-pay | Admitting: Neurosurgery

## 2011-03-08 ENCOUNTER — Ambulatory Visit
Admission: RE | Admit: 2011-03-08 | Discharge: 2011-03-08 | Disposition: A | Discharge status: Home / Self Care | Payer: PRIVATE HEALTH INSURANCE | Source: Ambulatory Visit | Attending: Neurosurgery | Admitting: Neurosurgery

## 2011-03-08 DIAGNOSIS — M542 Cervicalgia: Secondary | ICD-10-CM

## 2011-07-05 ENCOUNTER — Ambulatory Visit: Payer: Self-pay

## 2011-07-26 ENCOUNTER — Ambulatory Visit: Payer: Self-pay | Admitting: Urology

## 2011-08-28 ENCOUNTER — Emergency Department: Payer: Self-pay | Admitting: *Deleted

## 2011-08-28 LAB — COMPREHENSIVE METABOLIC PANEL
Alkaline Phosphatase: 71 U/L (ref 50–136)
Anion Gap: 9 (ref 7–16)
BUN: 13 mg/dL (ref 7–18)
Bilirubin,Total: 1.1 mg/dL — ABNORMAL HIGH (ref 0.2–1.0)
Calcium, Total: 9.2 mg/dL (ref 8.5–10.1)
Chloride: 105 mmol/L (ref 98–107)
EGFR (Non-African Amer.): >60
Glucose: 153 mg/dL — ABNORMAL HIGH (ref 65–99)
Potassium: 3.8 mmol/L (ref 3.5–5.1)
SGOT(AST): 36 U/L (ref 15–37)
Sodium: 141 mmol/L (ref 136–145)
Total Protein: 7.7 g/dL (ref 6.4–8.2)

## 2011-08-28 LAB — CBC
HCT: 45.9 % (ref 40.0–52.0)
MCHC: 33.8 g/dL (ref 32.0–36.0)
MCV: 92 fL (ref 80–100)
Platelet: 204 10*3/uL (ref 150–440)

## 2011-12-13 IMAGING — RF DG MYELOGRAM CERVICAL
14 series · 14 of 14 positions shown · IV contrast (omnipaque)
Comparison: none

CLINICAL DATA: Neck pain

CERVICAL MYELOGRAM
CT CERVICAL SPINE WITH INTRATHECAL CONTRAST
TECHNIQUE: An appropriate entry site was determined under
fluoroscopy. Operator donned sterile gloves and mask. Skin site was
marked, prepped with Betadine, and draped in usual sterile fashion,
and infiltrated locally with 1% lidocaine. A 22 gauge spinal needle
was  advanced into the thecal sac at L3-4 right interlaminar
approach. Clear colorless CSF returned. 10 ml Omnipaque 300 were
administered intrathecally for cervical myelography, followed by
axial CT scanning of the cervical spine. Coronal and sagittal
reconstructions were generated.

[Series 1: myelogram  white · 1 of 1 slices shown (1 of 11)]
[im 1/1]
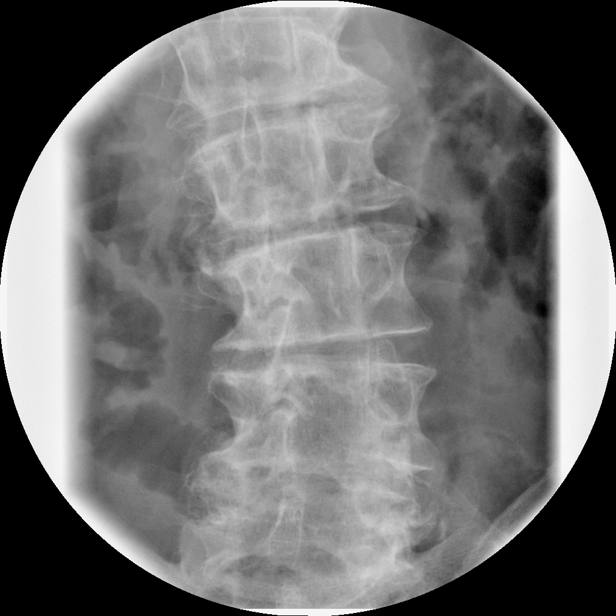

[Series 2: (hospital) · 1 of 1 slices shown (1 of 3)]
[im 1/1]
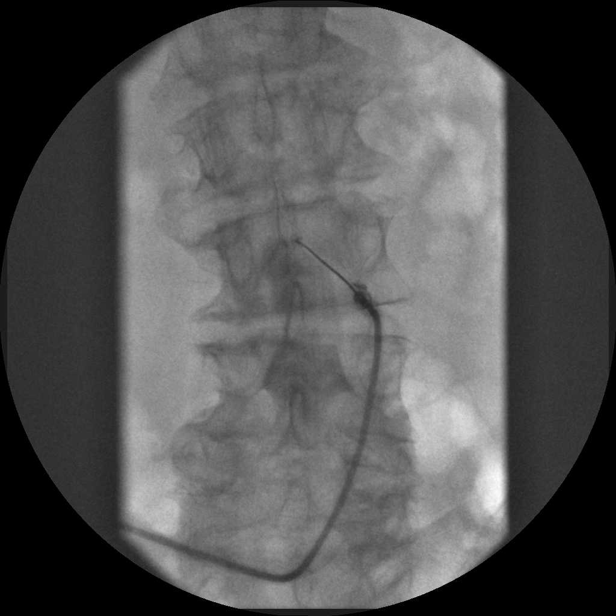

[Series 3: myelogram  white · 1 of 1 slices shown (2 of 11)]
[im 1/1]
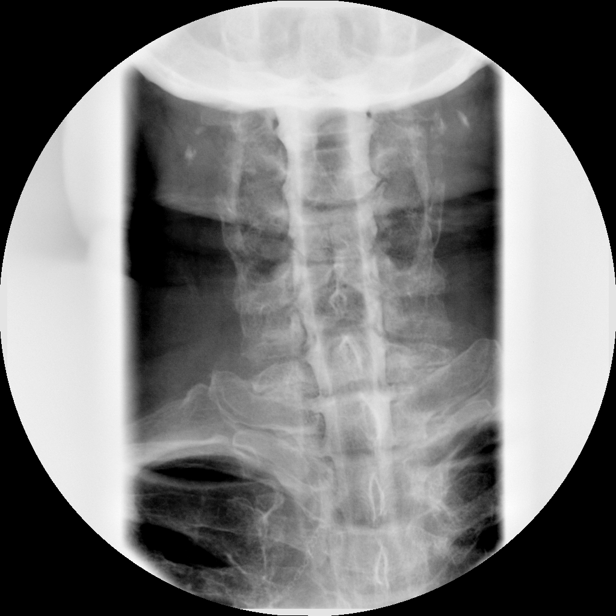

[Series 4: myelogram  white · 1 of 1 slices shown (3 of 11)]
[im 1/1]
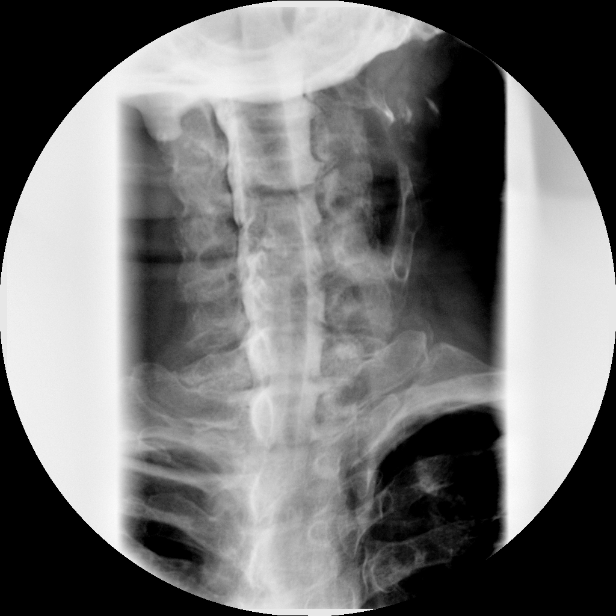

[Series 5: myelogram  white · 1 of 1 slices shown (4 of 11)]
[im 1/1]
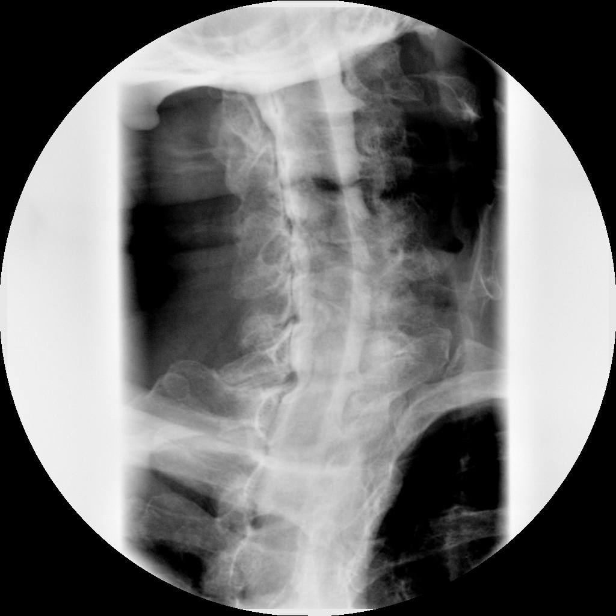

[Series 6: myelogram  white · 1 of 1 slices shown (5 of 11)]
[im 1/1]
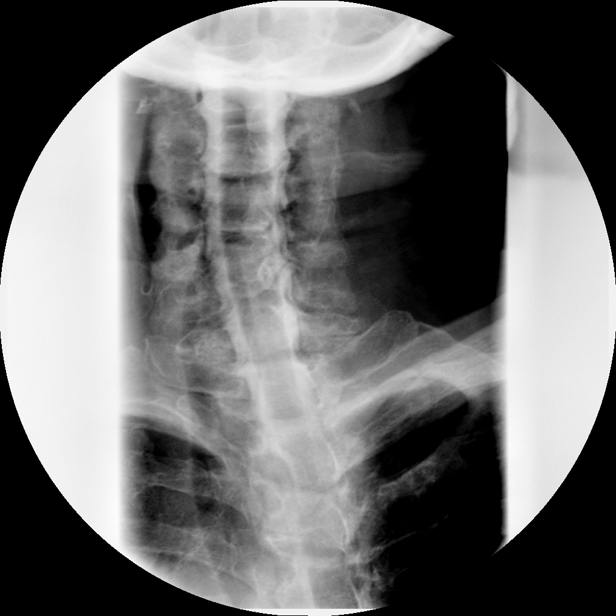

[Series 7: myelogram  white · 1 of 1 slices shown (6 of 11)]
[im 1/1]
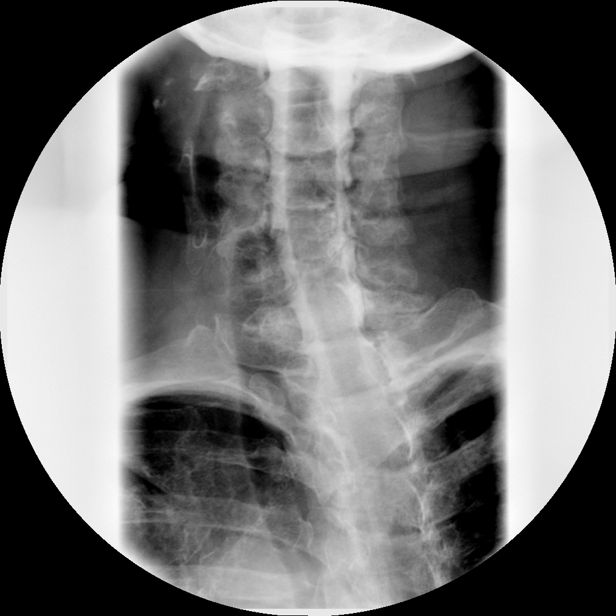

[Series 8: myelogram  white · 1 of 1 slices shown (7 of 11)]
[im 1/1]
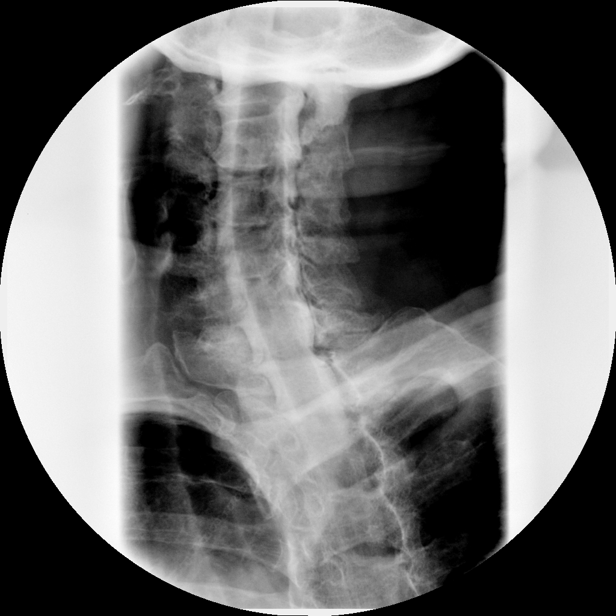

[Series 9: myelogram  white · 1 of 1 slices shown (8 of 11)]
[im 1/1]
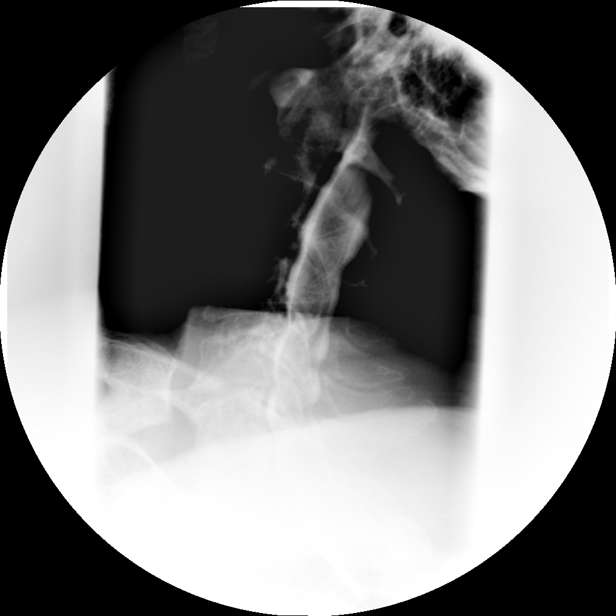

[Series 10: myelogram  white · 1 of 1 slices shown (9 of 11)]
[im 1/1]
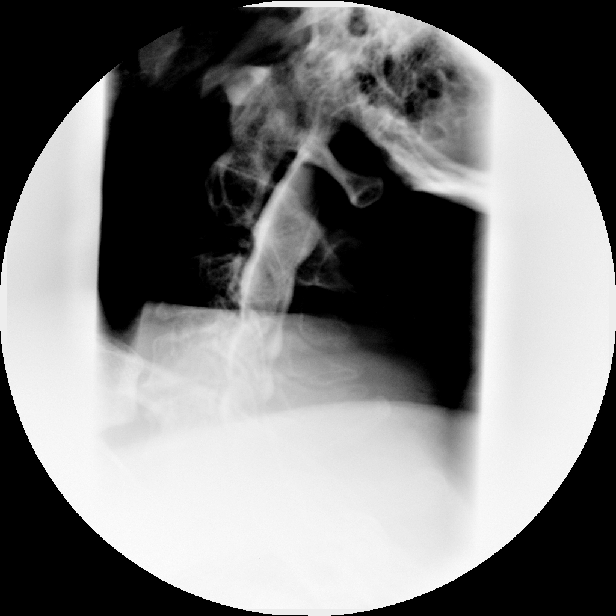

[Series 11: myelogram  white · 1 of 1 slices shown (10 of 11)]
[im 1/1]
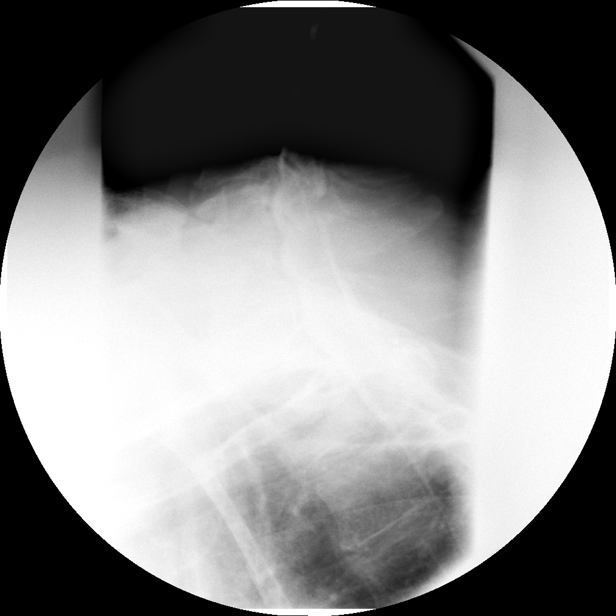

[Series 12: (hospital) · 1 of 1 slices shown (2 of 3)]
[im 1/1]
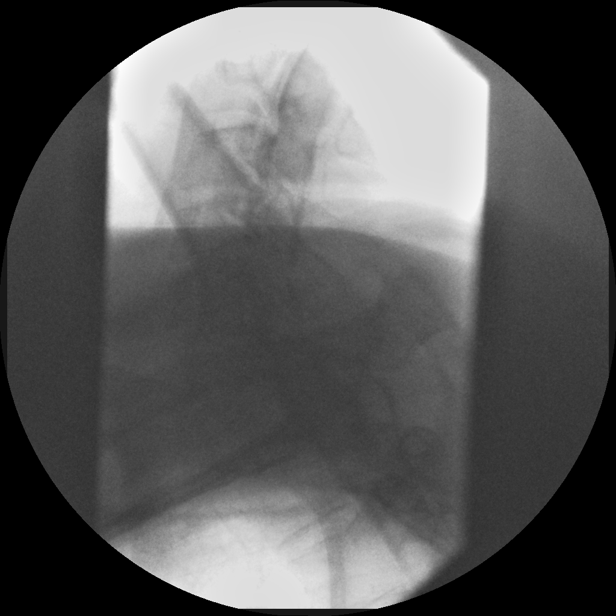

[Series 13: myelogram  white · 1 of 1 slices shown (11 of 11)]
[im 1/1]
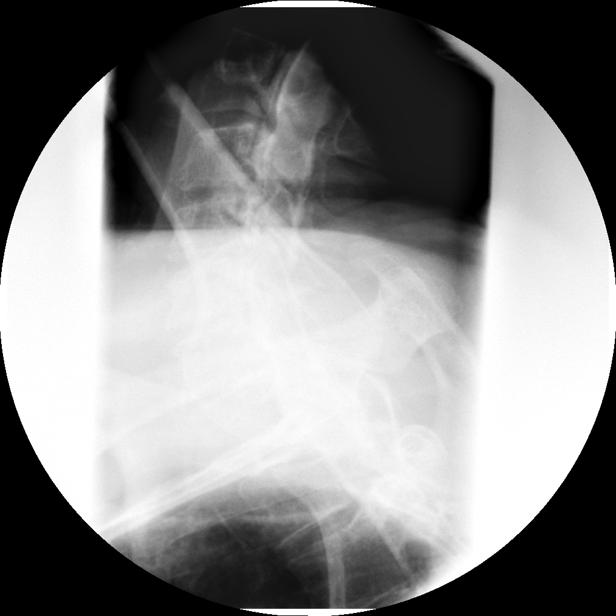

[Series 14: (hospital) · 1 of 1 slices shown (3 of 3)]
[im 1/1]
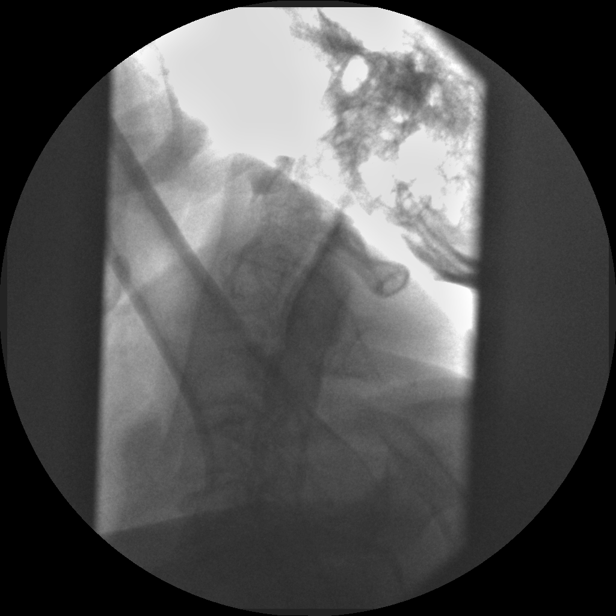

[14 of 14 positions shown; findings below may reference images not displayed]

FINDINGS: C2-3: Mild bulge without cord contact, flattening, or distortion.
No spinal or foraminal stenosis.  Heavy bilateral carotid
bifurcation plaque noted.

C3-4: Narrowing of the interspace.  Mild circumferential disc
bulge.  Early uncovertebral spurring with minimal encroachment on
the neural foramina.  Central canal mildly narrowed.  No cord
flattening or distortion.

C4-5: Moderate narrowing of the interspace with vacuum phenomenon.
Mild disc bulge.  There is early uncovertebral spurring.  This
results in bilateral foraminal encroachment, right greater than
left.  There is mild spinal stenosis.

C5-6: Moderate narrowing of the interspace with vacuum phenomenon.
Mild broad posterior disc bulge with minimal anterior cord
flattening, mild stenosis.  Mild uncovertebral hypertrophy results
in early foraminal encroachment, left worse than right.

C6-7: Narrowing of the interspace with a broad right paracentral
bulge with associated endplate spurring, minimally and denting the
anterior aspect of the cervical cord.  Uncovertebral spurring
results in mild bilateral foraminal encroachment.

C7-T1: Unremarkable

Advanced emphysematous changes are evident in the visualized lung
apices.
IMPRESSION: 1.  Mild spinal stenosis C3-4 without cord distortion.
2.  Mild spinal stenosis C4-5 with bilateral foraminal encroachment
secondary to disc bulge and uncovertebral disease.
3.  Mild stenosis C5-6 with left foraminal narrowing secondary to
uncovertebral hypertrophy.
4.  Right paracentral hard disc C6-7 with mild anterior cord
distortion.  There is bilateral foraminal encroachment secondary to
uncovertebral disease.

## 2012-01-23 IMAGING — CR DG CHEST 2V
2 series · 2 of 2 positions shown · non-contrast
Comparison: None.

CLINICAL DATA: C5-C7 anterior plate preop.

CHEST - 2 VIEW

[view not recorded (1 of 2)]
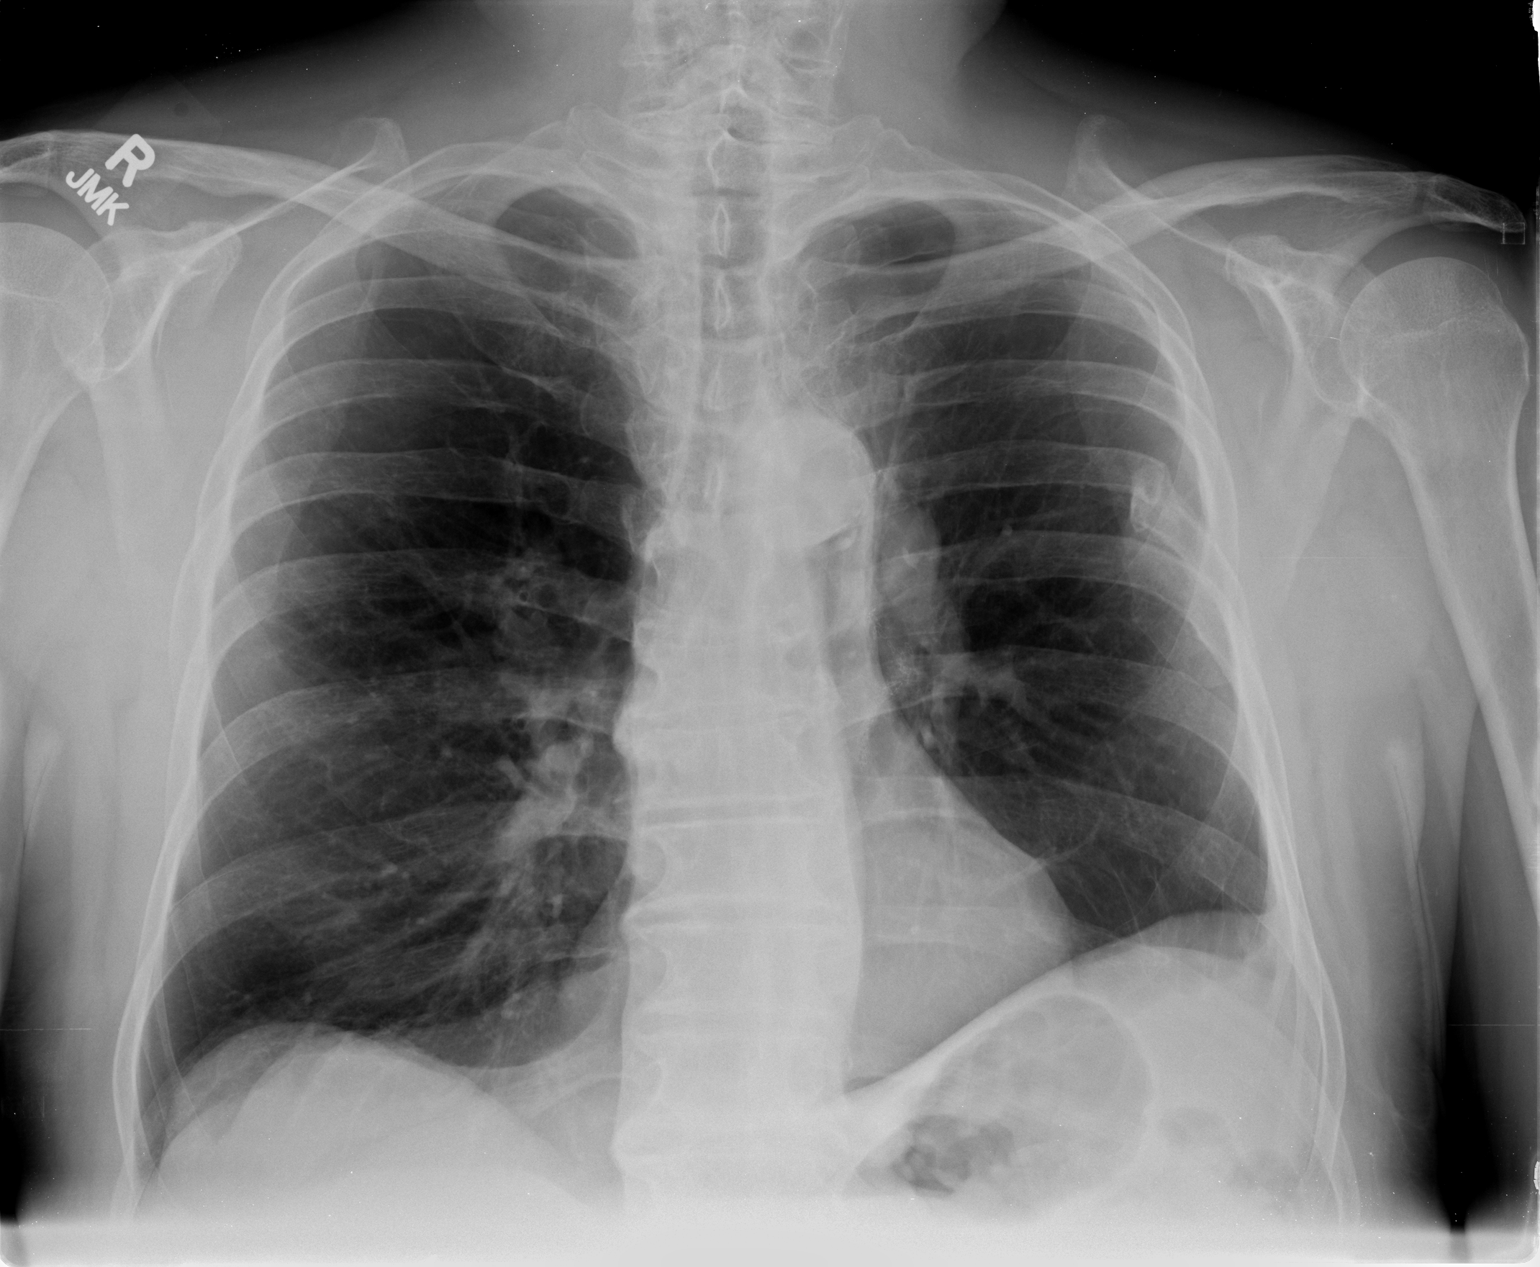

[view not recorded (2 of 2)]
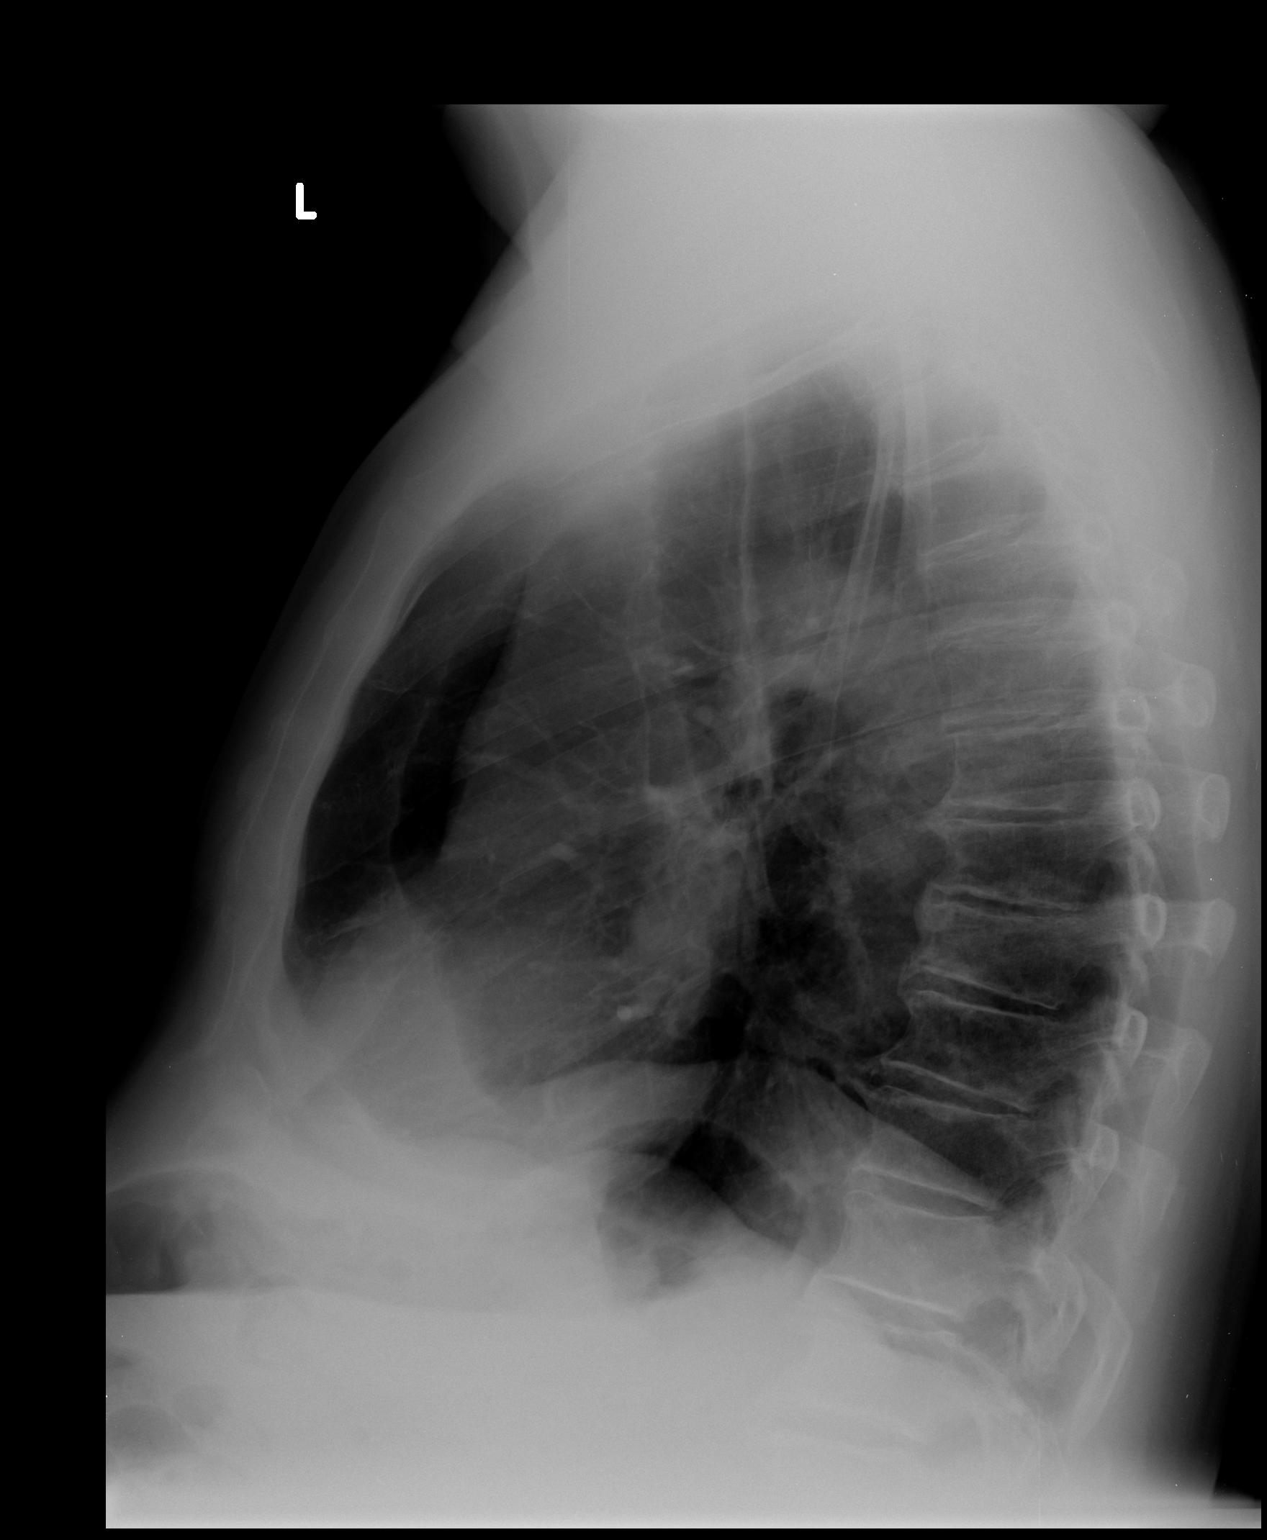

[2 of 2 positions shown; findings below may reference images not displayed]

FINDINGS: Moderate mid thoracic spondylosis.  Remote the sixth and
seventh posterolateral left rib fractures. Midline trachea.  Normal
heart size.  Suspect surgical sutures within the left hilum.  Mild
left hemidiaphragm elevation with left pleural thickening. No
pneumothorax.  Clear lungs.
IMPRESSION: 1. No acute cardiopulmonary disease.
2.  Post-traumatic and possibly postsurgical changes in the left
hemithorax.

## 2016-07-03 ENCOUNTER — Encounter: Payer: Medicare Other | Attending: Surgery | Admitting: Surgery

## 2016-07-03 DIAGNOSIS — K219 Gastro-esophageal reflux disease without esophagitis: Secondary | ICD-10-CM | POA: Diagnosis not present

## 2016-07-03 DIAGNOSIS — S31100A Unspecified open wound of abdominal wall, right upper quadrant without penetration into peritoneal cavity, initial encounter: Secondary | ICD-10-CM | POA: Diagnosis not present

## 2016-07-03 DIAGNOSIS — E538 Deficiency of other specified B group vitamins: Secondary | ICD-10-CM | POA: Diagnosis not present

## 2016-07-03 DIAGNOSIS — Z7984 Long term (current) use of oral hypoglycemic drugs: Secondary | ICD-10-CM | POA: Insufficient documentation

## 2016-07-03 DIAGNOSIS — G8929 Other chronic pain: Secondary | ICD-10-CM | POA: Diagnosis not present

## 2016-07-03 DIAGNOSIS — E11622 Type 2 diabetes mellitus with other skin ulcer: Secondary | ICD-10-CM | POA: Diagnosis not present

## 2016-07-03 DIAGNOSIS — I10 Essential (primary) hypertension: Secondary | ICD-10-CM | POA: Insufficient documentation

## 2016-07-03 DIAGNOSIS — E1142 Type 2 diabetes mellitus with diabetic polyneuropathy: Secondary | ICD-10-CM | POA: Insufficient documentation

## 2016-07-03 DIAGNOSIS — J449 Chronic obstructive pulmonary disease, unspecified: Secondary | ICD-10-CM | POA: Diagnosis not present

## 2016-07-03 DIAGNOSIS — F17218 Nicotine dependence, cigarettes, with other nicotine-induced disorders: Secondary | ICD-10-CM | POA: Insufficient documentation

## 2016-07-03 DIAGNOSIS — Z79891 Long term (current) use of opiate analgesic: Secondary | ICD-10-CM | POA: Diagnosis not present

## 2016-07-03 DIAGNOSIS — M549 Dorsalgia, unspecified: Secondary | ICD-10-CM | POA: Insufficient documentation

## 2016-07-03 DIAGNOSIS — M1990 Unspecified osteoarthritis, unspecified site: Secondary | ICD-10-CM | POA: Insufficient documentation

## 2016-07-03 DIAGNOSIS — S31102A Unspecified open wound of abdominal wall, epigastric region without penetration into peritoneal cavity, initial encounter: Secondary | ICD-10-CM | POA: Diagnosis not present

## 2016-07-03 DIAGNOSIS — Z79899 Other long term (current) drug therapy: Secondary | ICD-10-CM | POA: Insufficient documentation

## 2016-07-03 DIAGNOSIS — X58XXXA Exposure to other specified factors, initial encounter: Secondary | ICD-10-CM | POA: Diagnosis not present

## 2016-07-04 NOTE — Progress Notes (Signed)
[name], [name] ([id number]) Visit Report for 07/03/2016 Abuse/Suicide Risk Screen Details Patient Name: [name], [name]. Date of Service: 07/03/2016 8:00 AM Medical Record Number: [id number] Patient Account Number: [id number] Date of Birth/Sex: 04-03-1945 (71 y.o. Male) Treating RN: Montey Hora Primary Care Anton Cheramie: Thomes Lolling Other Clinician: Referring Ric Rosenberg: Treating Lucus Lambertson/Extender: Frann Rider in Treatment: 0 Abuse/Suicide Risk Screen Items Answer ABUSE/SUICIDE RISK SCREEN: Has anyone close to you tried to hurt or harm you recentlyo No Do you feel uncomfortable with anyone in your familyo No Has anyone forced you do things that you didnot want to doo No Do you have any thoughts of harming yourselfo No Patient displays signs or symptoms of abuse and/or neglect. No Electronic Signature(s) Signed: 07/03/2016 4:37:28 PM By: Montey Hora Entered By: Montey Hora on 07/03/2016 08:13:09 [name] ([id number]) -------------------------------------------------------------------------------- Activities of Daily Living Details Patient Name: [name], [name] Date of Service: 07/03/2016 8:00 AM Medical Record Number: [id number] Patient Account Number: [id number] Date of Birth/Sex: 1946/02/10 (71 y.o. Male) Treating RN: Montey Hora Primary Care Britnie Colville: Thomes Lolling Other Clinician: Referring Seda Kronberg: Treating Demontay Grantham/Extender: Frann Rider in Treatment: 0 Activities of Daily Living Items Answer Activities of Daily Living (Please select one for each item) Drive Automobile Completely Able Take Medications Completely Able Use Telephone Completely Able Care for Appearance Completely Able Use Toilet Completely Able Bath / Shower Completely Able Dress Self Completely Able Feed Self Completely Able Walk Completely Able Get In / Out Bed Completely Able Housework Completely Able Prepare Meals Completely [employer] for  Self Completely Able Electronic Signature(s) Signed: 07/03/2016 4:37:28 PM By: Montey Hora Entered By: Montey Hora on 07/03/2016 08:13:24 [name] ([id number]) -------------------------------------------------------------------------------- Education Assessment Details Patient Name: [name] Date of Service: 07/03/2016 8:00 AM Medical Record Number: [id number] Patient Account Number: [id number] Date of Birth/Sex: 01/09/1946 (71 y.o. Male) Treating RN: Montey Hora Primary Care Gaila Engebretsen: Thomes Lolling Other Clinician: Referring Jahniah Pallas: Treating Tavin Vernet/Extender: Frann Rider in Treatment: 0 Primary Learner Assessed: Patient Learning Preferences/Education Level/Primary Language Learning Preference: Explanation, Demonstration Highest Education Level: High School Preferred Language: English Cognitive Barrier Assessment/Beliefs Language Barrier: No Translator Needed: No Memory Deficit: No Emotional Barrier: No Cultural/Religious Beliefs Affecting Medical No Care: Physical Barrier Assessment Impaired Vision: No Impaired Hearing: No Decreased Hand dexterity: No Knowledge/Comprehension Assessment Knowledge Level: Medium Comprehension Level: Medium Ability to understand written Medium instructions: Ability to understand verbal Medium instructions: Motivation Assessment Anxiety Level: Calm Cooperation: Cooperative Education Importance: Acknowledges Need Interest in Health Problems: Asks Questions Perception: Coherent Willingness to Engage in Self- Medium Management Activities: Readiness to Engage in Self- Medium Management Activities: Electronic Signature(s) [name], [name] ([id number]) Signed: 07/03/2016 4:37:28 PM By: Montey Hora Entered By: Montey Hora on 07/03/2016 08:13:55 [name], [name] ([id number]) -------------------------------------------------------------------------------- Fall Risk Assessment Details Patient Name: [name] Date of Service: 07/03/2016 8:00 AM Medical Record Number: [id number] Patient Account Number: [id number] Date of Birth/Sex: 03-24-1945 (71 y.o. Male) Treating RN: Montey Hora Primary Care Fedrick Cefalu: Thomes Lolling Other Clinician: Referring Sladen Plancarte: Treating Caylynn Minchew/Extender: Frann Rider in Treatment: 0 Fall Risk Assessment Items Have you had 2 or more falls in the last 12 monthso 0 No Have you had any fall that resulted in injury in the last 12 monthso 0 No FALL RISK ASSESSMENT: History of falling - immediate or within 3 months 0 No Secondary diagnosis 0 No Ambulatory aid None/bed rest/wheelchair/nurse 0 Yes Crutches/cane/walker 0 No Furniture 0 No IV Access/Saline Lock 0 No Gait/Training Normal/bed  rest/immobile 0 No Weak 10 Yes Impaired 0 No Mental Status Oriented to own ability 0 Yes Electronic Signature(s) Signed: 07/03/2016 4:37:28 PM By: Montey Hora Entered By: Montey Hora on 07/03/2016 08:14:17 [name] ([id number]) -------------------------------------------------------------------------------- Foot Assessment Details Patient Name: [name] Date of Service: 07/03/2016 8:00 AM Medical Record Number: [id number] Patient Account Number: [id number] Date of Birth/Sex: 05-Oct-1945 (71 y.o. Male) Treating RN: Montey Hora Primary Care Kadeja Granada: Thomes Lolling Other Clinician: Referring Ananda Caya: Treating Jossalyn Forgione/Extender: Frann Rider in Treatment: 0 Foot Assessment Items Site Locations + = Sensation present, - = Sensation absent, C = Callus, U = Ulcer R = Redness, W = Warmth, M = Maceration, PU = Pre-ulcerative lesion F = Fissure, S = Swelling, D = Dryness Assessment Right: Left: Other Deformity: No No Prior Foot Ulcer: No No Prior Amputation: No No Charcot Joint: No No Ambulatory Status: Ambulatory Without Help Gait: Steady Electronic Signature(s) Signed: 07/03/2016 4:37:28 PM By: Montey Hora Entered By: Montey Hora on 07/03/2016 08:24:06 [name] ([id number]) -------------------------------------------------------------------------------- Nutrition Risk Assessment Details Patient Name: [name] Date of Service: 07/03/2016 8:00 AM Medical Record Number: [id number] Patient Account Number: [id number] Date of Birth/Sex: Feb 19, 1946 (71 y.o. Male) Treating RN: Montey Hora Primary Care Janoah Menna: Thomes Lolling Other Clinician: Referring Kynzlee Hucker: Treating Leandre Wien/Extender: Frann Rider in Treatment: 0 Height (in): Weight (lbs): Body Mass Index (BMI): Nutrition Risk Assessment Items NUTRITION RISK SCREEN: I have an illness or condition that made me change the kind and/or 0 No amount of food I eat I eat fewer than two meals per day 0 No I eat few fruits and vegetables, or milk products 0 No I have three or more drinks of beer, liquor or wine almost every day 0 No I have tooth or mouth problems that make it hard for me to eat 0 No I don't always have enough money to buy the food I need 0 No I eat alone most of the time 0 No I take three or more different prescribed or over-the-counter drugs a 1 Yes day Without wanting to, I have lost or gained 10 pounds in the last six 0 No months I am not always physically able to shop, cook and/or feed myself 0 No Nutrition Protocols Good Risk Protocol 0 No interventions needed Moderate Risk Protocol Electronic Signature(s) Signed: 07/03/2016 4:37:28 PM By: Montey Hora Entered By: Montey Hora on 07/03/2016 08:14:23

## 2016-07-04 NOTE — Progress Notes (Signed)
[name], [name] ([id number]) Visit Report for 07/03/2016 Chief Complaint Document Details Patient Name: [name], [name]. Date of Service: 07/03/2016 8:00 AM Medical Record Number: [id number] Patient Account Number: [id number] Date of Birth/Sex: 1945-05-19 (71 y.o. Male) Treating RN: Montey Hora Primary Care Provider: Thomes Lolling Other Clinician: Referring Provider: Treating Provider/Extender: Frann Rider in Treatment: 0 Information Obtained from: Patient Chief Complaint Patients presents for treatment of an open diabetic ulcer to the anterior abdominal wall in the epigastric and right upper quadrant which she's had for over 5 years Electronic Signature(s) Signed: 07/03/2016 9:17:52 AM By: Christin Fudge MD, FACS Entered By: Christin Fudge on 07/03/2016 09:17:52 [name] ([id number]) -------------------------------------------------------------------------------- Debridement Details Patient Name: [name] Date of Service: 07/03/2016 8:00 AM Medical Record Number: [id number] Patient Account Number: [id number] Date of Birth/Sex: Dec 24, 1945 (71 y.o. Male) Treating RN: Montey Hora Primary Care Provider: Thomes Lolling Other Clinician: Referring Provider: Treating Provider/Extender: Frann Rider in Treatment: 0 Debridement Performed for Wound #1 Right Abdomen - midline Assessment: Performed By: Physician Christin Fudge, MD Debridement: Open Wound/Selective Debridement Selective Description: Pre-procedure Yes - 08:50 Verification/Time Out Taken: Start Time: 08:50 Pain Control: Lidocaine 4% Topical Solution Level: Non-Viable Tissue Total Area Debrided (L x 1.9 (cm) x 2.3 (cm) = 4.37 (cm) W): Tissue and other Non-Viable, Eschar, Fibrin/Slough, Other material debrided: Instrument: Forceps, Scissors Bleeding: Minimum Hemostasis Achieved: Pressure End Time: 08:53 Procedural Pain: 0 Post Procedural Pain: 0 Response to Treatment: Procedure was  tolerated well Post Debridement Measurements of Total Wound Length: (cm) 1.9 Width: (cm) 2.3 Depth: (cm) 0.1 Volume: (cm) 0.343 Character of Wound/Ulcer Post Improved Debridement: Severity of Tissue Post Debridement: Fat layer exposed Post Procedure Diagnosis Same as Pre-procedure Electronic Signature(s) Signed: 07/03/2016 9:17:07 AM By: Christin Fudge MD, FACS Signed: 07/03/2016 4:37:28 PM By: Regan Lemming ([id number]) Entered By: Christin Fudge on 07/03/2016 09:17:07 [name] ([id number]) -------------------------------------------------------------------------------- Debridement Details Patient Name: [name], [name]. Date of Service: 07/03/2016 8:00 AM Medical Record Number: [id number] Patient Account Number: [id number] Date of Birth/Sex: 09/06/1945 (71 y.o. Male) Treating RN: Montey Hora Primary Care Provider: Thomes Lolling Other Clinician: Referring Provider: Treating Provider/Extender: Frann Rider in Treatment: 0 Debridement Performed for Wound #2 Left Abdomen - midline Assessment: Performed By: Physician Christin Fudge, MD Debridement: Open Wound/Selective Debridement Selective Description: Pre-procedure Yes - 08:53 Verification/Time Out Taken: Start Time: 08:53 Pain Control: Lidocaine 4% Topical Solution Level: Non-Viable Tissue Total Area Debrided (L x 4.2 (cm) x 1.6 (cm) = 6.72 (cm) W): Tissue and other Non-Viable, Eschar, Fibrin/Slough, Other material debrided: Instrument: Forceps, Scissors Bleeding: Minimum Hemostasis Achieved: Pressure End Time: 08:56 Procedural Pain: 0 Post Procedural Pain: 0 Response to Treatment: Procedure was tolerated well Post Debridement Measurements of Total Wound Length: (cm) 4.2 Width: (cm) 1.6 Depth: (cm) 0.3 Volume: (cm) 1.583 Character of Wound/Ulcer Post Improved Debridement: Severity of Tissue Post Debridement: Fat layer exposed Post Procedure Diagnosis Same as  Pre-procedure Electronic Signature(s) Signed: 07/03/2016 9:17:18 AM By: Christin Fudge MD, FACS Signed: 07/03/2016 4:37:28 PM By: Regan Lemming ([id number]) Entered By: Christin Fudge on 07/03/2016 09:17:17 [name] ([id number]) -------------------------------------------------------------------------------- HPI Details Patient Name: [name], [name]. Date of Service: 07/03/2016 8:00 AM Medical Record Number: [id number] Patient Account Number: [id number] Date of Birth/Sex: 07-18-1945 (71 y.o. Male) Treating RN: Montey Hora Primary Care Provider: Thomes Lolling Other Clinician: Referring Provider: Treating Provider/Extender: Frann Rider in Treatment: 0 History of Present Illness Location: abdomen wall wounds,-- right upper quadrant and epigastric  region Quality: Patient reports experiencing a dull pain to affected area(s). Severity: Patient states wound are getting worse. Duration: Patient has had the wound for >5 year's prior to seeking treatment at the wound center Timing: Pain in wound is Intermittent (comes and goes Context: The wound appeared gradually over time Modifying Factors: Consults to this date include:not seen a surgeon and only sees his PCP for his medical complaints Associated Signs and Symptoms: Patient reports having increase discharge. HPI Description: 71 year old gentleman has been seen recently by his PCP Dr. Thomes Lolling, who sees him with a history of diabetes mellitus, peripheral neuropathy, B12 deficiency, hypertension, osteoarthritis and the patient had recent blood sugar checked which was 163. Past medical history includes essential hypertension, incisional hernia, diabetes mellitus, colon polyps, COPD, tobacco abuse in the past, chewing tobacco, GERD. He has also been treated for chronic back pain with no sciatica and is on oxycodone. His last hemoglobin A1c was 6.9% His abdominal surgery was done at [facility] over 15 years ago  and gradually there was sutures which protruded and then there was mesh which protruded. He washes this during his shower but does not put any dressing and the wound is covered with a lot of debris. He has never had a surgical consultation for this. Electronic Signature(s) Signed: 07/03/2016 9:17:57 AM By: Christin Fudge MD, FACS Previous Signature: 07/03/2016 9:10:17 AM Version By: Christin Fudge MD, FACS Previous Signature: 07/03/2016 8:33:00 AM Version By: Christin Fudge MD, FACS Entered By: Christin Fudge on 07/03/2016 09:17:57 [name] ([id number]) -------------------------------------------------------------------------------- Physical Exam Details Patient Name: [name], [name] Date of Service: 07/03/2016 8:00 AM Medical Record Number: [id number] Patient Account Number: [id number] Date of Birth/Sex: November 18, 1945 (71 y.o. Male) Treating RN: Montey Hora Primary Care Provider: Thomes Lolling Other Clinician: Referring Provider: Treating Provider/Extender: Frann Rider in Treatment: 0 Constitutional . Pulse regular. Respirations normal and unlabored. Afebrile. . Eyes Nonicteric. Reactive to light. Ears, Nose, Mouth, and Throat Lips, teeth, and gums WNL.[name] Moist mucosa without lesions. Neck supple and nontender. No palpable supraclavicular or cervical adenopathy. Normal sized without goiter. Respiratory WNL. No retractions.. Breath sounds WNL, No rubs, rales, rhonchi, or wheeze.. Cardiovascular Heart rhythm and rate regular, no murmur or gallop.. Pedal Pulses WNL. No clubbing, cyanosis or edema. Gastrointestinal (GI) Abdomen without masses or tenderness.. No liver or spleen enlargement or tenderness.. Lymphatic No adneopathy. No adenopathy. No adenopathy. Musculoskeletal Adexa without tenderness or enlargement.. Digits and nails w/o clubbing, cyanosis, infection, petechiae, ischemia, or inflammatory conditions.. Integumentary (Hair, Skin) No suspicious lesions. No  crepitus or fluctuance. No peri-wound warmth or erythema. No masses.[name] Psychiatric Judgement and insight Intact.. No evidence of depression, anxiety, or agitation.. Notes he has got a large incisional hernia and the anterior abdominal wall and right upper quadrant in the region of a previous open hernia repair after open gallbladder surgery. There are 2 areas where the wound is covered with a lot of debris and there is mesh protruding. She fully cleaning of this area done and the superficial mesh and globs of debris were excised to reveal a clean wound base with hyperventilation tissue. No evidence of gross cellulitis or infection surrounding it. Electronic Signature(s) Signed: 07/03/2016 9:19:12 AM By: Christin Fudge MD, FACS Entered By: Christin Fudge on 07/03/2016 09:19:12 [name] ([id number]) -------------------------------------------------------------------------------- Physician Orders Details Patient Name: [name], [name] Date of Service: 07/03/2016 8:00 AM Medical Record Number: [id number] Patient Account Number: [id number] Date of Birth/Sex: 1945-11-11 (71 y.o. Male) Treating RN: Montey Hora Primary Care  Provider: Thomes Lolling Other Clinician: Referring Provider: Treating Provider/Extender: Frann Rider in Treatment: 0 Verbal / Phone Orders: No Diagnosis Coding Wound Cleansing Wound #1 Right Abdomen - midline o Clean wound with Normal Saline. o May Shower, gently pat wound dry prior to applying new dressing. Wound #2 Left Abdomen - midline o Clean wound with Normal Saline. o May Shower, gently pat wound dry prior to applying new dressing. Anesthetic Wound #1 Right Abdomen - midline o Topical Lidocaine 4% cream applied to wound bed prior to debridement Wound #2 Left Abdomen - midline o Topical Lidocaine 4% cream applied to wound bed prior to debridement Skin Barriers/Peri-Wound Care Wound #1 Right Abdomen - midline o Skin Prep Wound #2 Left  Abdomen - midline o Skin Prep Primary Wound Dressing Wound #1 Right Abdomen - midline o Aquacel Ag Wound #2 Left Abdomen - midline o Aquacel Ag Secondary Dressing Wound #1 Right Abdomen - midline o Boardered Foam Dressing Wound #2 Left Abdomen - midline o Boardered Foam Dressing [name], [place] ([id number]) Dressing Change Frequency Wound #1 Right Abdomen - midline o Change dressing every other day. Wound #2 Left Abdomen - midline o Change dressing every other day. Follow-up Appointments Wound #1 Right Abdomen - midline o Return Appointment in 1 week. Wound #2 Left Abdomen - midline o Return Appointment in 1 week. Electronic Signature(s) Signed: 07/03/2016 3:55:33 PM By: Christin Fudge MD, FACS Signed: 07/03/2016 4:37:28 PM By: Montey Hora Entered By: Montey Hora on 07/03/2016 08:58:47 [name] ([id number]) -------------------------------------------------------------------------------- Problem List Details Patient Name: [name], [name] Date of Service: 07/03/2016 8:00 AM Medical Record Number: [id number] Patient Account Number: [id number] Date of Birth/Sex: [birth year] (71 y.o. Male) Treating RN: Montey Hora Primary Care Provider: Thomes Lolling Other Clinician: Referring Provider: Treating Provider/Extender: Frann Rider in Treatment: 0 Active Problems ICD-10 Encounter Code Description Active Date Diagnosis E11.622 Type 2 diabetes mellitus with other skin ulcer 07/03/2016 Yes S31.100A Unspecified open wound of abdominal wall, right upper 07/03/2016 Yes quadrant without penetration into peritoneal cavity, initial encounter S31.102A Unspecified open wound of abdominal wall, epigastric 07/03/2016 Yes region without penetration into peritoneal cavity, initial encounter F17.228 Nicotine dependence, chewing tobacco, with other 07/03/2016 Yes nicotine-induced disorders Inactive Problems Resolved Problems Electronic Signature(s) Signed:  07/03/2016 9:16:10 AM By: Christin Fudge MD, FACS Entered By: Christin Fudge on 07/03/2016 09:16:10 [name] ([id number]) -------------------------------------------------------------------------------- Progress Note Details Patient Name: [name] Date of Service: 07/03/2016 8:00 AM Medical Record Number: [id number] Patient Account Number: [id number] Date of Birth/Sex: February 21, 1946 (71 y.o. Male) Treating RN: Montey Hora Primary Care Provider: Thomes Lolling Other Clinician: Referring Provider: Treating Provider/Extender: Frann Rider in Treatment: 0 Subjective Chief Complaint Information obtained from Patient Patients presents for treatment of an open diabetic ulcer to the anterior abdominal wall in the epigastric and right upper quadrant which she's had for over 5 years History of Present Illness (HPI) The following HPI elements were documented for the patient's wound: Location: abdomen wall wounds,-- right upper quadrant and epigastric region Quality: Patient reports experiencing a dull pain to affected area(s). Severity: Patient states wound are getting worse. Duration: Patient has had the wound for >5 year's prior to seeking treatment at the wound center Timing: Pain in wound is Intermittent (comes and goes Context: The wound appeared gradually over time Modifying Factors: Consults to this date include:not seen a surgeon and only sees his PCP for his medical complaints Associated Signs and Symptoms: Patient reports having increase discharge. 71 year old gentleman has been seen  recently by his PCP Dr. Thomes Lolling, who sees him with a history of diabetes mellitus, peripheral neuropathy, B12 deficiency, hypertension, osteoarthritis and the patient had recent blood sugar checked which was 163. Past medical history includes essential hypertension, incisional hernia, diabetes mellitus, colon polyps, COPD, tobacco abuse in the past, chewing tobacco, GERD. He has also  been treated for chronic back pain with no sciatica and is on oxycodone. His last hemoglobin A1c was 6.9% His abdominal surgery was done at [facility] over 15 years ago and gradually there was sutures which protruded and then there was mesh which protruded. He washes this during his shower but does not put any dressing and the wound is covered with a lot of debris. He has never had a surgical consultation for this. Wound History Patient presents with 2 open wounds that have been present for approximately more than 1 year. Patient has been treating wounds in the following manner: open to air. Laboratory tests have not been performed in the last month. Patient reportedly has not tested positive for an antibiotic resistant organism. Patient reportedly has not tested positive for osteomyelitis. Patient reportedly has not had testing performed to evaluate circulation in the legs. Patient History Information obtained from Patient. [name], [name] ([id number]) Allergies aspirin Social History Former smoker - chew tobacco, Marital Status - Widowed, Alcohol Use - Never, Drug Use - No History, Caffeine Use - Daily. Medical History Eyes Patient has history of Cataracts - removed Hematologic/Lymphatic Denies history of Anemia, Hemophilia, Human Immunodeficiency Virus, Lymphedema, Sickle Cell Disease Respiratory Patient has history of Chronic Obstructive Pulmonary Disease (COPD) Denies history of Aspiration, Asthma, Pneumothorax, Sleep Apnea, Tuberculosis Cardiovascular Patient has history of Hypertension Denies history of Angina, Arrhythmia, Congestive Heart Failure, Coronary Artery Disease, Deep Vein Thrombosis, Hypotension, Myocardial Infarction, Peripheral Arterial Disease, Peripheral Venous Disease, Phlebitis, Vasculitis Gastrointestinal Denies history of Cirrhosis , Colitis, Crohn s, Hepatitis A, Hepatitis B, Hepatitis C Endocrine Patient has history of Type II  Diabetes Genitourinary Denies history of End Stage Renal Disease Immunological Denies history of Lupus Erythematosus, Raynaud s, Scleroderma Integumentary (Skin) Denies history of History of Burn, History of pressure wounds Musculoskeletal Patient has history of Osteoarthritis Denies history of Gout, Rheumatoid Arthritis, Osteomyelitis Neurologic Patient has history of Neuropathy Denies history of Dementia, Quadriplegia, Paraplegia, Seizure Disorder Oncologic Denies history of Received Chemotherapy, Received Radiation Psychiatric Denies history of Anorexia/bulimia, Confinement Anxiety Patient is treated with Oral Agents. Blood sugar is not tested. Medical And Surgical History Notes Oncologic 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 Review of Systems (ROS) Constitutional Symptoms [facility]) [name], [name] ([id number]) The patient has no complaints or symptoms. Eyes Complains or has symptoms of Glasses / Contacts - glasses. Ear/Nose/Mouth/Throat The patient has no complaints or symptoms. Hematologic/Lymphatic The patient has no complaints or symptoms. Respiratory The patient has no complaints or symptoms. Cardiovascular The patient has no complaints or symptoms. Gastrointestinal The patient has no complaints or symptoms. Endocrine Denies complaints or symptoms of Hepatitis, Thyroid disease, Polydypsia (Excessive Thirst). Genitourinary The patient has no complaints or symptoms. Immunological The patient has no complaints or symptoms. Integumentary (Skin) The patient has no complaints or symptoms. Musculoskeletal Denies complaints or symptoms of Muscle Pain, Muscle Weakness. Neurologic Denies complaints or symptoms of Numbness/parasthesias, Focal/Weakness. Oncologic The patient has no complaints or symptoms. Psychiatric The patient has no complaints or symptoms. Medications Goody's Headache Powder oral 1 1 powder oral daily as needed Spiriva with  HandiHaler 18 mcg and inhalation capsules inhalation 1  1 capsule, w/inhalation device inhalation daily gabapentin 300 mg capsule oral 2 2 capsules oral (600 mg.) three times daily loratadine 10 mg tablet oral 1 1 tablet oral daily metformin 500 mg tablet oral 1 1 tablet oral two times daily glipizide ER 5 mg tablet, extended release 24 hr oral 1 1 tablet extended release 24hr oral atorvastatin 40 mg tablet oral 1 1 tablet oral daily lisinopril 10 mg tablet oral 1 1 tablet oral daily finasteride 5 mg tablet oral 1 1 tablet oral daily tamsulosin 0.4 mg capsule oral 1 1 capsule,extended release 24hr oral daily albuterol sulfate HFA 90 mcg/actuation aerosol inhaler inhalation 2 2 HFA aerosol inhaler inhalations every six hours as needed ranitidine 150 mg capsule oral 1 1 capsule oral two times daily as needed polyethylene glycol 3350 17 gram/dose oral powder oral 1 1 powder oral as needed oxycodone-acetaminophen 5 mg-325 mg tablet oral 1 1 tablet oral every six hours as needed ALEXIO, SROKA. ([id number]) fluticasone 50 mcg/actuation nasal spray,suspension nasal 1 1 spray,suspension nasal into each nostril daily omeprazole 10 mg capsule,delayed release oral 1 1 capsule,delayed release(DR/EC) oral Objective Constitutional Pulse regular. Respirations normal and unlabored. Afebrile. Vitals Time Taken: 8:16 AM, Height: 65 in, Source: Measured, Weight: 161 lbs, Source: Measured, BMI: 26.8, Temperature: 97.6 F, Pulse: 76 bpm, Respiratory Rate: 18 breaths/min, Blood Pressure: 194/92 mmHg. General Notes: patient states that he just took his BP pill on his way out of his house Eyes Nonicteric. Reactive to light. Ears, Nose, Mouth, and Throat Lips, teeth, and gums WNL.[name] Moist mucosa without lesions. Neck supple and nontender. No palpable supraclavicular or cervical adenopathy. Normal sized without goiter. Respiratory WNL. No retractions.. Breath sounds WNL, No rubs, rales, rhonchi, or  wheeze.. Cardiovascular Heart rhythm and rate regular, no murmur or gallop.. Pedal Pulses WNL. No clubbing, cyanosis or edema. Gastrointestinal (GI) Abdomen without masses or tenderness.. No liver or spleen enlargement or tenderness.. Lymphatic No adneopathy. No adenopathy. No adenopathy. Musculoskeletal Adexa without tenderness or enlargement.. Digits and nails w/o clubbing, cyanosis, infection, petechiae, ischemia, or inflammatory conditions.[name] Psychiatric Judgement and insight Intact.. No evidence of depression, anxiety, or agitation.. General Notes: he has got a large incisional hernia and the anterior abdominal wall and right upper quadrant in the region of a previous open hernia repair after open gallbladder surgery. There are 2 areas [name], [name]. ([id number]) where the wound is covered with a lot of debris and there is mesh protruding. She fully cleaning of this area done and the superficial mesh and globs of debris were excised to reveal a clean wound base with hyperventilation tissue. No evidence of gross cellulitis or infection surrounding it. Integumentary (Hair, Skin) No suspicious lesions. No crepitus or fluctuance. No peri-wound warmth or erythema. No masses.. Wound #1 status is Open. Original cause of wound was Gradually Appeared. The wound is located on the Right Abdomen - midline. The wound measures 1.9cm length x 2.3cm width x 0.1cm depth; 3.432cm^2 area and 0.343cm^3 volume. The wound is limited to skin breakdown. There is no tunneling or undermining noted. There is a medium amount of serous drainage noted. The wound margin is flat and intact. There is no granulation within the wound bed. There is a large (67-100%) amount of necrotic tissue within the wound bed including Eschar. The periwound skin appearance did not exhibit: Callus, Crepitus, Excoriation, Induration, Rash, Scarring, Dry/Scaly, Maceration, Atrophie Blanche, Cyanosis, Ecchymosis, Hemosiderin Staining,  Mottled, Pallor, Rubor, Erythema. Periwound temperature was noted as No Abnormality. Wound #2 status is  Open. Original cause of wound was Gradually Appeared. The wound is located on the Left Abdomen - midline. The wound measures 4.2cm length x 1.6cm width x 0.1cm depth; 5.278cm^2 area and 0.528cm^3 volume. The wound is limited to skin breakdown. There is no tunneling or undermining noted. There is a medium amount of serous drainage noted. The wound margin is flat and intact. There is no granulation within the wound bed. There is a large (67-100%) amount of necrotic tissue within the wound bed including Eschar. The periwound skin appearance did not exhibit: Callus, Crepitus, Excoriation, Induration, Rash, Scarring, Dry/Scaly, Maceration, Atrophie Blanche, Cyanosis, Ecchymosis, Hemosiderin Staining, Mottled, Pallor, Rubor, Erythema. Periwound temperature was noted as No Abnormality. Assessment Active Problems ICD-10 E11.622 - Type 2 diabetes mellitus with other skin ulcer S31.100A - Unspecified open wound of abdominal wall, right upper quadrant without penetration into peritoneal cavity, initial encounter S31.102A - Unspecified open wound of abdominal wall, epigastric region without penetration into peritoneal cavity, initial encounter F17.228 - Nicotine dependence, chewing tobacco, with other nicotine-induced disorders this 70 year old gentleman has had a large incisional hernia with previous mesh protruding out of the abdomen wounds in the right upper quadrant epigastric region. After the cleaning of the wound and trimming the external mesh carefully, I have recommended: 1. Silver alginate and a bordered foam to be applied daily after washing with soap and water. EDREI, NORGAARD ([id number]) 2. I would highly recommend a surgical opinion at [facility] for further workup including CT scan and possible revision surgery for this large incisional hernia. 3. I have asked him to completely  stop all nicotine intake including chewing tobacco and I have spent more than 3 minutes discussing with him the need to completely give up nicotine and I have discussed the methodology the risks and benefits 4. Good control of his diabetes mellitus 5. Adequate protein, vitamin A, vitamin C and zinc. He has had all questions answered and we will send a note to his PCP Dr. Thomes Lolling, and will be available to talk to him. Procedures Wound #1 Wound #1 is an Atypical located on the Right Abdomen - midline . There was a Non-Viable Tissue Open Wound/Selective [phone]) debridement with total area of 4.37 sq cm performed by Christin Fudge, MD. with the following instrument(s): Forceps and Scissors to remove Non-Viable tissue/material including Fibrin/Slough, Eschar, and Other after achieving pain control using Lidocaine 4% Topical Solution. A time out was conducted at 08:50, prior to the start of the procedure. A Minimum amount of bleeding was controlled with Pressure. The procedure was tolerated well with a pain level of 0 throughout and a pain level of 0 following the procedure. Post Debridement Measurements: 1.9cm length x 2.3cm width x 0.1cm depth; 0.343cm^3 volume. Character of Wound/Ulcer Post Debridement is improved. Severity of Tissue Post Debridement is: Fat layer exposed. Post procedure Diagnosis Wound #1: Same as Pre-Procedure Wound #2 Wound #2 is an Atypical located on the Left Abdomen - midline . There was a Non-Viable Tissue Open Wound/Selective [phone]) debridement with total area of 6.72 sq cm performed by Christin Fudge, MD. with the following instrument(s): Forceps and Scissors to remove Non-Viable tissue/material including Fibrin/Slough, Eschar, and Other after achieving pain control using Lidocaine 4% Topical Solution. A time out was conducted at 08:53, prior to the start of the procedure. A Minimum amount of bleeding was controlled with Pressure. The procedure was  tolerated well with a pain level of 0 throughout and a pain level of 0 following the procedure.  Post Debridement Measurements: 4.2cm length x 1.6cm width x 0.3cm depth; 1.583cm^3 volume. Character of Wound/Ulcer Post Debridement is improved. Severity of Tissue Post Debridement is: Fat layer exposed. Post procedure Diagnosis Wound #2: Same as Pre-Procedure Plan Wound Cleansing: Wound #1 Right Abdomen - midline: [name], [name]. ([id number]) Clean wound with Normal Saline. May Shower, gently pat wound dry prior to applying new dressing. Wound #2 Left Abdomen - midline: Clean wound with Normal Saline. May Shower, gently pat wound dry prior to applying new dressing. Anesthetic: Wound #1 Right Abdomen - midline: Topical Lidocaine 4% cream applied to wound bed prior to debridement Wound #2 Left Abdomen - midline: Topical Lidocaine 4% cream applied to wound bed prior to debridement Skin Barriers/Peri-Wound Care: Wound #1 Right Abdomen - midline: Skin Prep Wound #2 Left Abdomen - midline: Skin Prep Primary Wound Dressing: Wound #1 Right Abdomen - midline: Aquacel Ag Wound #2 Left Abdomen - midline: Aquacel Ag Secondary Dressing: Wound #1 Right Abdomen - midline: Boardered Foam Dressing Wound #2 Left Abdomen - midline: Boardered Foam Dressing Dressing Change Frequency: Wound #1 Right Abdomen - midline: Change dressing every other day. Wound #2 Left Abdomen - midline: Change dressing every other day. Follow-up Appointments: Wound #1 Right Abdomen - midline: Return Appointment in 1 week. Wound #2 Left Abdomen - midline: Return Appointment in 1 week. this 71 year old gentleman has had a large incisional hernia with previous mesh protruding out of the abdomen wounds in the right upper quadrant epigastric region. After the cleaning of the wound and trimming the external mesh carefully, I have recommended: 1. Silver alginate and a bordered foam to be applied daily after washing with  soap and water. 2. I would highly recommend a surgical opinion at [facility] for further workup including CT scan and possible revision surgery for this large incisional hernia. HINES, KLOSS ([id number]) 3. I have asked him to completely stop all nicotine intake including chewing tobacco and I have spent more than 3 minutes discussing with him the need to completely give up nicotine and I have discussed the methodology the risks and benefits 4. Good control of his diabetes mellitus 5. Adequate protein, vitamin A, vitamin C and zinc. He has had all questions answered and we will send a note to his PCP Dr. Thomes Lolling, and will be available to talk to him. Electronic Signature(s) Signed: 07/03/2016 3:56:25 PM By: Christin Fudge MD, FACS Previous Signature: 07/03/2016 9:22:29 AM Version By: Christin Fudge MD, FACS Entered By: Christin Fudge on 07/03/2016 15:56:24 [name] ([id number]) -------------------------------------------------------------------------------- ROS/PFSH Details Patient Name: [name] Date of Service: 07/03/2016 8:00 AM Medical Record Number: [id number] Patient Account Number: [id number] Date of Birth/Sex: June 29, 1945 (71 y.o. Male) Treating RN: Montey Hora Primary Care Provider: Thomes Lolling Other Clinician: Referring Provider: Treating Provider/Extender: Frann Rider in Treatment: 0 Information Obtained From Patient Wound History Do you currently have one or more open woundso Yes How many open wounds do you currently haveo 2 Approximately how long have you had your woundso more than 1 year How have you been treating your wound(s) until nowo open to air Has your wound(s) ever healed and then re-openedo No Have you had any lab work done in the past montho No Have you tested positive for an antibiotic resistant organism (MRSA, VRE)o No Have you tested positive for osteomyelitis (bone infection)o No Have you had any tests for circulation on  your legso No Eyes Complaints and Symptoms: Positive for: Glasses / Contacts - glasses  Medical History: Positive for: Cataracts - removed Endocrine Complaints and Symptoms: Negative for: Hepatitis; Thyroid disease; Polydypsia (Excessive Thirst) Medical History: Positive for: Type II Diabetes Treated with: Oral agents Blood sugar tested every day: No Genitourinary Complaints and Symptoms: No Complaints or Symptoms Complaints and Symptoms: Negative for: Kidney failure/ Dialysis; Incontinence/dribbling Medical History: LENDELL, GALLICK ([id number]) Negative for: End Stage Renal Disease Immunological Complaints and Symptoms: No Complaints or Symptoms Complaints and Symptoms: Negative for: Hives; Itching Medical History: Negative for: Lupus Erythematosus; Raynaudos; Scleroderma Integumentary (Skin) Complaints and Symptoms: No Complaints or Symptoms Complaints and Symptoms: Negative for: Wounds; Bleeding or bruising tendency; Breakdown; Swelling Medical History: Negative for: History of Burn; History of pressure wounds Musculoskeletal Complaints and Symptoms: Negative for: Muscle Pain; Muscle Weakness Medical History: Positive for: Osteoarthritis Negative for: Gout; Rheumatoid Arthritis; Osteomyelitis Neurologic Complaints and Symptoms: Negative for: Numbness/parasthesias; Focal/Weakness Medical History: Positive for: Neuropathy Negative for: Dementia; Quadriplegia; Paraplegia; Seizure Disorder Psychiatric Complaints and Symptoms: No Complaints or Symptoms Complaints and Symptoms: Negative for: Anxiety; Claustrophobia Medical History: TRAY, KLAYMAN ([id number]) Negative for: Anorexia/bulimia; Confinement Anxiety Constitutional Symptoms (General Health) Complaints and Symptoms: No Complaints or Symptoms Ear/Nose/Mouth/Throat Complaints and Symptoms: No Complaints or Symptoms Hematologic/Lymphatic Complaints and Symptoms: No Complaints or Symptoms Medical  History: Negative for: Anemia; Hemophilia; Human Immunodeficiency Virus; Lymphedema; Sickle Cell Disease Respiratory Complaints and Symptoms: No Complaints or Symptoms Medical History: Positive for: Chronic Obstructive Pulmonary Disease (COPD) Negative for: Aspiration; Asthma; Pneumothorax; Sleep Apnea; Tuberculosis Cardiovascular Complaints and Symptoms: No Complaints or Symptoms Medical History: Positive for: Hypertension Negative for: Angina; Arrhythmia; Congestive Heart Failure; Coronary Artery Disease; Deep Vein Thrombosis; Hypotension; Myocardial Infarction; Peripheral Arterial Disease; Peripheral Venous Disease; Phlebitis; Vasculitis Gastrointestinal Complaints and Symptoms: No Complaints or Symptoms Medical History: Negative for: Cirrhosis ; Colitis; Crohnos; Hepatitis A; Hepatitis B; Hepatitis C Oncologic Complaints and Symptoms: No Complaints or Symptoms HENDRYX, RICKE ([id number]) Medical History: Negative for: Received Chemotherapy; Received Radiation Past Medical History Notes: 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 Electronic Signature(s) Signed: 07/03/2016 3:55:33 PM By: Christin Fudge MD, FACS Signed: 07/03/2016 4:37:28 PM By: Montey Hora Entered By: Christin Fudge on 07/03/2016 09:12:51 [name]  ([id number]) -------------------------------------------------------------------------------- SuperBill Details Patient Name: TITO, AUSMUS Date of Service: 07/03/2016 Medical Record Number: [id number] Patient Account Number: [id number] Date of Birth/Sex: [birth year] (71 y.o. Male) Treating RN: Montey Hora Primary Care Provider: Thomes Lolling Other Clinician: Referring Provider: Treating Provider/Extender: Frann Rider in Treatment: 0 Diagnosis Coding ICD-10 Codes Code Description E11.622 Type 2 diabetes mellitus with other skin ulcer Unspecified open wound of abdominal wall, right upper quadrant without penetration into S31.100A peritoneal cavity, initial encounter Unspecified open wound of abdominal wall, epigastric region without penetration into S31.102A peritoneal cavity, initial encounter F17.228 Nicotine dependence, chewing tobacco, with other nicotine-induced disorders Facility Procedures CPT4: Description Modifier Quantity Code [id number] 99213 - WOUND CARE VISIT-LEV 3 EST PT 1 CPT4: 25852778 97597 - DEBRIDE WOUND 1ST 20 SQ CM OR < 1 ICD-10 Description Diagnosis E11.622 Type 2 diabetes mellitus with other skin  ulcer S31.100A Unspecified open wound of abdominal wall, right upper quadrant without penetration into peritoneal  cavity, initial encounter S31.102A Unspecified open wound of abdominal wall, epigastric region without penetration into peritoneal cavity, initial encounter F17.228 Nicotine dependence, chewing tobacco, with other nicotine-induced disorders CPT4: 35521747 99406-SMOKING CESSATION 3-10MINS 1 ICD-10 Description Diagnosis E11.622 Type 2 diabetes mellitus with other skin ulcer F17.228 Nicotine dependence, chewing tobacco, with other nicotine-induced disorders Physician Procedures CPT4: Description Modifier Quantity Code [id number] - WC PHYS LEVEL 4 - NEW PT [address] ARES, CARDOZO ([id number]) Electronic Signature(s) Signed: 07/03/2016 9:23:07 AM By: Christin Fudge MD, FACS Entered By: Christin Fudge on 07/03/2016 09:23:07

## 2016-07-04 NOTE — Progress Notes (Signed)
[name], [name] ([id number]) Visit Report for 07/03/2016 Allergy List Details Patient Name: [name], [name]. Date of Service: 07/03/2016 8:00 AM Medical Record Number: [id number] Patient Account Number: [id number] Date of Birth/Sex: [birth year] (71 y.o. Male) Treating RN: Montey Hora Primary Care [name]: Thomes Lolling Other Clinician: Referring Ryleah Miramontes: Treating Shannara Winbush/Extender: Frann Rider in Treatment: 0 Allergies Active Allergies aspirin Allergy Notes Electronic Signature(s) Signed: 07/03/2016 4:37:28 PM By: Montey Hora Entered By: Montey Hora on 07/03/2016 08:14:47 [name] ([id number]) -------------------------------------------------------------------------------- Arrival Information Details Patient Name: [name] Date of Service: 07/03/2016 8:00 AM Medical Record Number: [id number] Patient Account Number: [id number] Date of Birth/Sex: 17-Sep-1945 (71 y.o. Male) Treating RN: Montey Hora Primary Care Bruno Leach: Thomes Lolling Other Clinician: Referring [name]: Treating Karriem Muench/Extender: Frann Rider in Treatment: 0 Visit Information Patient Arrived: Ambulatory Arrival Time: 08:12 Accompanied By: self Transfer Assistance: None Patient Identification Verified: Yes Secondary Verification Process Yes Completed: Patient Has Alerts: Yes Patient Alerts: DMII Electronic Signature(s) Signed: 07/03/2016 4:37:28 PM By: Montey Hora Entered By: Montey Hora on 07/03/2016 08:12:55 [name] ([id number]) -------------------------------------------------------------------------------- Clinic Level of Care Assessment Details Patient Name: [name] Date of Service: 07/03/2016 8:00 AM Medical Record Number: [id number] Patient Account Number: [id number] Date of Birth/Sex: May 01, 1945 (71 y.o. Male) Treating RN: Montey Hora Primary Care Natoria Archibald: Thomes Lolling Other Clinician: Referring Kenlee Vogt: Treating Jannae Fagerstrom/Extender:  Frann Rider in Treatment: 0 Clinic Level of Care Assessment Items TOOL 1 Quantity Score '[]'$  - Use when EandM and Procedure is performed on INITIAL visit 0 ASSESSMENTS - Nursing Assessment / Reassessment X - General Physical Exam (combine w/ comprehensive assessment (listed just 1 20 below) when performed on new pt. evals) X - Comprehensive Assessment (HX, ROS, Risk Assessments, Wounds Hx, etc.) 1 25 ASSESSMENTS - Wound and Skin Assessment / Reassessment X - Dermatologic / Skin Assessment (not related to wound area) 1 10 ASSESSMENTS - Ostomy and/or Continence Assessment and Care '[]'$  - Incontinence Assessment and Management 0 '[]'$  - Ostomy Care Assessment and Management (repouching, etc.) 0 PROCESS - Coordination of Care X - Simple Patient / Family Education for ongoing care 1 15 '[]'$  - Complex (extensive) Patient / Family Education for ongoing care 0 '[]'$  - Staff obtains Programmer, systems, Records, Test Results / Process Orders 0 '[]'$  - Staff telephones HHA, Nursing Homes / Clarify orders / etc 0 '[]'$  - Routine Transfer to another Facility (non-emergent condition) 0 '[]'$  - Routine Hospital Admission (non-emergent condition) 0 X - New Admissions / Biomedical engineer / Ordering NPWT, Apligraf, etc. 1 15 '[]'$  - Emergency Hospital Admission (emergent condition) 0 PROCESS - Special Needs '[]'$  - Pediatric / Minor Patient Management 0 '[]'$  - Isolation Patient Management 0 [name], [name] ([id number]) '[]'$  - Hearing / Language / Visual special needs 0 '[]'$  - Assessment of Community assistance (transportation, D/C planning, etc.) 0 '[]'$  - Additional assistance / Altered mentation 0 '[]'$  - Support Surface(s) Assessment (bed, cushion, seat, etc.) 0 INTERVENTIONS - Miscellaneous '[]'$  - External ear exam 0 '[]'$  - Patient Transfer (multiple staff / Civil Service fast streamer / Similar devices) 0 '[]'$  - Simple Staple / Suture removal (25 or less) 0 '[]'$  - Complex Staple / Suture removal (26 or more) 0 '[]'$  - Hypo/Hyperglycemic Management (do  not check if billed separately) 0 '[]'$  - Ankle / Brachial Index (ABI) - do not check if billed separately 0 Has the patient been seen at the hospital within the last three years: Yes Total Score: 85 Level Of Care: New/Established - Level 3 Electronic Signature(s)  Signed: 07/03/2016 4:37:28 PM By: Montey Hora Entered By: Montey Hora on 07/03/2016 09:00:29 [name] ([id number]) -------------------------------------------------------------------------------- Encounter Discharge Information Details Patient Name: [name] Date of Service: 07/03/2016 8:00 AM Medical Record Number: [id number] Patient Account Number: [id number] Date of Birth/Sex: Jun 25, 1945 (71 y.o. Male) Treating RN: Montey Hora Primary Care Ossie Yebra: Thomes Lolling Other Clinician: Referring Lois Slagel: Treating Shavonta Gossen/Extender: Frann Rider in Treatment: 0 Encounter Discharge Information Items Discharge Pain Level: 0 Discharge Condition: Stable Ambulatory Status: Ambulatory Discharge Destination: Home Transportation: Private Auto Accompanied By: self Schedule Follow-up Appointment: Yes Medication Reconciliation completed No and provided to Patient/Care Julianne Chamberlin: Patient Clinical Summary of Care: Declined Electronic Signature(s) Signed: 07/03/2016 9:59:06 AM By: Sharon Mt Entered By: Sharon Mt on 07/03/2016 09:59:06 [name] ([id number]) -------------------------------------------------------------------------------- Multi Wound Chart Details Patient Name: [name] Date of Service: 07/03/2016 8:00 AM Medical Record Number: [id number] Patient Account Number: [id number] Date of Birth/Sex: 1945/07/16 (71 y.o. Male) Treating RN: Montey Hora Primary Care Joice Nazario: Thomes Lolling Other Clinician: Referring Adren Dollins: Treating Luccas Towell/Extender: Frann Rider in Treatment: 0 Vital Signs Height(in): 65 Pulse(bpm): 76 Weight(lbs): 161 Blood  Pressure 194/92 (mmHg): Body Mass Index(BMI): 27 Temperature(F): 97.6 Respiratory Rate 18 (breaths/min): Photos: [N/A:N/A] Wound Location: Right Abdomen - midline Left Abdomen - midline N/A Wounding Event: Gradually Appeared Gradually Appeared N/A Primary Etiology: Atypical Atypical N/A Comorbid History: Cataracts, Chronic Cataracts, Chronic N/A Obstructive Pulmonary Obstructive Pulmonary Disease (COPD), Disease (COPD), Hypertension, Type II Hypertension, Type II Diabetes, Osteoarthritis, Diabetes, Osteoarthritis, Neuropathy Neuropathy Date Acquired: 05/17/2015 05/17/2015 N/A Weeks of Treatment: 0 0 N/A Wound Status: Open Open N/A Measurements L x W x D 1.9x2.3x0.1 4.2x1.6x0.1 N/A (cm) Area (cm) : 3.432 5.278 N/A Volume (cm) : 0.343 0.528 N/A Classification: Full Thickness Without Full Thickness Without N/A Exposed Support Exposed Support Structures Structures Exudate Amount: Medium Medium N/A Exudate Type: Serous Serous N/A Exudate Color: amber amber N/A Wound Margin: Flat and Intact Flat and Intact N/A [name], [name] ([id number]) Granulation Amount: None Present (0%) None Present (0%) N/A Necrotic Amount: Large (67-100%) Large (67-100%) N/A Necrotic Tissue: Eschar Eschar N/A Exposed Structures: Fascia: No Fascia: No N/A Fat Layer (Subcutaneous Fat Layer (Subcutaneous Tissue) Exposed: No Tissue) Exposed: No Tendon: No Tendon: No Muscle: No Muscle: No Joint: No Joint: No Bone: No Bone: No Limited to Skin Limited to Skin Breakdown Breakdown Epithelialization: None None N/A Debridement: Open Wound/Selective Open Wound/Selective N/A ([id number]) - Selective ([id number]) - Selective Pre-procedure 08:50 08:53 N/A Verification/Time Out Taken: Pain Control: Lidocaine 4% Topical Lidocaine 4% Topical N/A Solution Solution Tissue Debrided: Necrotic/Eschar, Necrotic/Eschar, N/A Fibrin/Slough, Other Fibrin/Slough, Other Level: Non-Viable Tissue Non-Viable Tissue  N/A Debridement Area (sq 4.37 6.72 N/A cm): Instrument: Forceps, Scissors Forceps, Scissors N/A Bleeding: Minimum Minimum N/A Hemostasis Achieved: Pressure Pressure N/A Procedural Pain: 0 0 N/A Post Procedural Pain: 0 0 N/A Debridement Treatment Procedure was tolerated Procedure was tolerated N/A Response: well well Post Debridement 1.9x2.3x0.1 4.2x1.6x0.3 N/A Measurements L x W x D (cm) Post Debridement 0.343 1.583 N/A Volume: (cm) Periwound Skin Texture: Excoriation: No Excoriation: No N/A Induration: No Induration: No Callus: No Callus: No Crepitus: No Crepitus: No Rash: No Rash: No Scarring: No Scarring: No Periwound Skin Maceration: No Maceration: No N/A Moisture: Dry/Scaly: No Dry/Scaly: No Periwound Skin Color: Atrophie Blanche: No Atrophie Blanche: No N/A Cyanosis: No Cyanosis: No Ecchymosis: No Ecchymosis: No Erythema: No Erythema: No Hemosiderin Staining: No Hemosiderin Staining: No [name], [name]. ([id number]) Mottled: No Mottled: No Pallor: No Pallor: No Rubor: No Rubor:  No Temperature: No Abnormality No Abnormality N/A Tenderness on No No N/A Palpation: Wound Preparation: Ulcer Cleansing: Ulcer Cleansing: N/A Rinsed/Irrigated with Rinsed/Irrigated with Saline Saline Topical Anesthetic Topical Anesthetic Applied: Other: lidocaine Applied: Other: lidocaine 4% 4% Procedures Performed: Debridement Debridement N/A Treatment Notes Wound #1 (Right Abdomen - midline) 1. Cleansed with: Clean wound with Normal Saline 2. Anesthetic Topical Lidocaine 4% cream to wound bed prior to debridement 3. Peri-wound Care: Skin Prep 4. Dressing Applied: Aquacel Ag 5. Secondary Dressing Applied Bordered Foam Dressing Dry Gauze Wound #2 (Left Abdomen - midline) 1. Cleansed with: Clean wound with Normal Saline 2. Anesthetic Topical Lidocaine 4% cream to wound bed prior to debridement 3. Peri-wound Care: Skin Prep 4. Dressing Applied: Aquacel Ag 5.  Secondary Dressing Applied Bordered Foam Dressing Dry Gauze Electronic Signature(s) Signed: 07/03/2016 9:16:17 AM By: Christin Fudge MD, FACS Entered By: Christin Fudge on 07/03/2016 09:16:16 [name], [name] ([id number]) [name], [name] ([id number]) -------------------------------------------------------------------------------- [place] Details Patient Name: [name], [name] Date of Service: 07/03/2016 8:00 AM Medical Record Number: [id number] Patient Account Number: [id number] Date of Birth/Sex: [birth year] (72 y.o. Male) Treating RN: Montey Hora Primary Care Hala Narula: Thomes Lolling Other Clinician: Referring Irvine Glorioso: Treating Lathan Gieselman/Extender: Frann Rider in Treatment: 0 Active Inactive ` Abuse / Safety / Falls / Self Care Management Nursing Diagnoses: Impaired physical mobility Goals: Patient will remain injury free Date Initiated: 07/03/2016 Target Resolution Date: 09/14/2016 Goal Status: Active Interventions: Assess fall risk on admission and as needed Notes: ` Orientation to the Wound Care Program Nursing Diagnoses: Knowledge deficit related to the wound healing center program Goals: Patient/caregiver will verbalize understanding of the [facility] Date Initiated: 07/03/2016 Target Resolution Date: 09/15/2016 Goal Status: Active Interventions: Provide education on orientation to the wound center Notes: ` Wound/Skin Impairment Nursing Diagnoses: Impaired tissue integrity KEISON, [name] ([id number]) Goals: Patient/caregiver will verbalize understanding of skin care regimen Date Initiated: 07/03/2016 Target Resolution Date: 09/15/2016 Goal Status: Active Ulcer/skin breakdown will have a volume reduction of 30% by week 4 Date Initiated: 07/03/2016 Target Resolution Date: 09/15/2016 Goal Status: Active Ulcer/skin breakdown will have a volume reduction of 50% by week 8 Date Initiated: 07/03/2016 Target Resolution Date:  09/15/2016 Goal Status: Active Ulcer/skin breakdown will have a volume reduction of 80% by week 12 Date Initiated: 07/03/2016 Target Resolution Date: 09/15/2016 Goal Status: Active Ulcer/skin breakdown will heal within 14 weeks Date Initiated: 07/03/2016 Target Resolution Date: 09/15/2016 Goal Status: Active Interventions: Assess patient/caregiver ability to obtain necessary supplies Assess patient/caregiver ability to perform ulcer/skin care regimen upon admission and as needed Assess ulceration(s) every visit Notes: Electronic Signature(s) Signed: 07/03/2016 4:37:28 PM By: Montey Hora Entered By: Montey Hora on 07/03/2016 08:50:23 [name] ([id number]) -------------------------------------------------------------------------------- Pain Assessment Details Patient Name: [name] Date of Service: 07/03/2016 8:00 AM Medical Record Number: [id number] Patient Account Number: [id number] Date of Birth/Sex: September 12, 1945 (71 y.o. Male) Treating RN: Montey Hora Primary Care Jazmyn Offner: Thomes Lolling Other Clinician: Referring Donneisha Beane: Treating Jordyne Poehlman/Extender: Frann Rider in Treatment: 0 Active Problems Location of Pain Severity and Description of Pain Patient Has Paino No Site Locations Pain Management and Medication Current Pain Management: Notes Topical or injectable lidocaine is offered to patient for acute pain when surgical debridement is performed. If needed, Patient is instructed to use over the counter pain medication for the following 24-48 hours after debridement. Wound care MDs do not prescribed pain medications. Patient has chronic pain or uncontrolled pain. Patient has been instructed to make an appointment  with their Primary Care Physician for pain management. Electronic Signature(s) Signed: 07/03/2016 4:37:28 PM By: Montey Hora Entered By: Montey Hora on 07/03/2016 [name], [name]  ([id number]) -------------------------------------------------------------------------------- Patient/Caregiver Education Details Patient Name: [name], WEHMEYER Date of Service: 07/03/2016 8:00 AM Medical Record Number: [id number] Patient Account Number: [id number] Date of Birth/Gender: [birth year] (71 y.o. Male) Treating RN: Montey Hora Primary Care Physician: Thomes Lolling Other Clinician: Referring Physician: Treating Physician/Extender: Frann Rider in Treatment: 0 Education Assessment Education Provided To: Patient Education Topics Provided Wound/Skin Impairment: Handouts: Other: wound care and plan of care as ordered Methods: Demonstration, Explain/Verbal Responses: State content correctly Electronic Signature(s) Signed: 07/03/2016 4:37:28 PM By: Montey Hora Entered By: Montey Hora on 07/03/2016 Wallace, [place] ([id number]) -------------------------------------------------------------------------------- Wound Assessment Details Patient Name: [name] Date of Service: 07/03/2016 8:00 AM Medical Record Number: [id number] Patient Account Number: [id number] Date of Birth/Sex: February 12, 1946 (71 y.o. Male) Treating RN: Montey Hora Primary Care Jarell Mcewen: Thomes Lolling Other Clinician: Referring Acsa Estey: Treating Madeline Bebout/Extender: Frann Rider in Treatment: 0 Wound Status Wound Number: 1 Primary Atypical Etiology: Wound Location: Right Abdomen - midline Wound Open Wounding Event: Gradually Appeared Status: Date Acquired: 05/17/2015 Comorbid Cataracts, Chronic Obstructive Weeks Of Treatment: 0 History: Pulmonary Disease (COPD), Clustered Wound: No Hypertension, Type II Diabetes, Osteoarthritis, Neuropathy Photos Wound Measurements Length: (cm) 1.9 Width: (cm) 2.3 Depth: (cm) 0.1 Area: (cm) 3.432 Volume: (cm) 0.343 % Reduction in Area: 0% % Reduction in Volume: 0% Epithelialization: None Tunneling: No Undermining: No Wound  Description Full Thickness Without Exposed Classification: Support Structures Wound Margin: Flat and Intact Exudate Medium Amount: Exudate Type: Serous Exudate Color: amber Foul Odor After Cleansing: No Slough/Fibrino No Wound Bed Granulation Amount: None Present (0%) Exposed Structure Necrotic Amount: Large (67-100%) Fascia Exposed: No HRIDAAN, BOUSE ([id number]) Necrotic Quality: Eschar Fat Layer (Subcutaneous Tissue) Exposed: No Tendon Exposed: No Muscle Exposed: No Joint Exposed: No Bone Exposed: No Limited to Skin Breakdown Periwound Skin Texture Texture Color No Abnormalities Noted: No No Abnormalities Noted: No Callus: No Atrophie Blanche: No Crepitus: No Cyanosis: No Excoriation: No Ecchymosis: No Induration: No Erythema: No Rash: No Hemosiderin Staining: No Scarring: No Mottled: No Pallor: No Moisture Rubor: No No Abnormalities Noted: No Dry / Scaly: No Temperature / Pain Maceration: No Temperature: No Abnormality Wound Preparation Ulcer Cleansing: Rinsed/Irrigated with Saline Topical Anesthetic Applied: Other: lidocaine 4%, Treatment Notes Wound #1 (Right Abdomen - midline) 1. Cleansed with: Clean wound with Normal Saline 2. Anesthetic Topical Lidocaine 4% cream to wound bed prior to debridement 3. Peri-wound Care: Skin Prep 4. Dressing Applied: Aquacel Ag 5. Secondary Dressing Applied Bordered Foam Dressing Dry Gauze Electronic Signature(s) Signed: 07/03/2016 4:37:28 PM By: Montey Hora Entered By: Montey Hora on 07/03/2016 09:35:56 [name] ([id number]) -------------------------------------------------------------------------------- Wound Assessment Details Patient Name: [name] Date of Service: 07/03/2016 8:00 AM Medical Record Number: [id number] Patient Account Number: [id number] Date of Birth/Sex: 09/17/1945 (71 y.o. Male) Treating RN: Montey Hora Primary Care Preston Weill: Thomes Lolling Other  Clinician: Referring Ignacia Gentzler: Treating Zoria Rawlinson/Extender: Frann Rider in Treatment: 0 Wound Status Wound Number: 2 Primary Atypical Etiology: Wound Location: Left Abdomen - midline Wound Open Wounding Event: Gradually Appeared Status: Date Acquired: 05/17/2015 Comorbid Cataracts, Chronic Obstructive Weeks Of Treatment: 0 History: Pulmonary Disease (COPD), Clustered Wound: No Hypertension, Type II Diabetes, Osteoarthritis, Neuropathy Photos Wound Measurements Length: (cm) 4.2 Width: (cm) 1.6 Depth: (cm) 0.1 Area: (cm) 5.278 Volume: (cm) 0.528 % Reduction in Area: 0% % Reduction in Volume: 0% Epithelialization:  None Tunneling: No Undermining: No Wound Description Full Thickness Without Exposed Classification: Support Structures Wound Margin: Flat and Intact Exudate Medium Amount: Exudate Type: Serous Exudate Color: amber Foul Odor After Cleansing: No Slough/Fibrino No Wound Bed Granulation Amount: None Present (0%) Exposed Structure Necrotic Amount: Large (67-100%) Fascia Exposed: No REYMUNDO, WINSHIP ([id number]) Necrotic Quality: Eschar Fat Layer (Subcutaneous Tissue) Exposed: No Tendon Exposed: No Muscle Exposed: No Joint Exposed: No Bone Exposed: No Limited to Skin Breakdown Periwound Skin Texture Texture Color No Abnormalities Noted: No No Abnormalities Noted: No Callus: No Atrophie Blanche: No Crepitus: No Cyanosis: No Excoriation: No Ecchymosis: No Induration: No Erythema: No Rash: No Hemosiderin Staining: No Scarring: No Mottled: No Pallor: No Moisture Rubor: No No Abnormalities Noted: No Dry / Scaly: No Temperature / Pain Maceration: No Temperature: No Abnormality Wound Preparation Ulcer Cleansing: Rinsed/Irrigated with Saline Topical Anesthetic Applied: Other: lidocaine 4%, Treatment Notes Wound #2 (Left Abdomen - midline) 1. Cleansed with: Clean wound with Normal Saline 2. Anesthetic Topical Lidocaine 4% cream to  wound bed prior to debridement 3. Peri-wound Care: Skin Prep 4. Dressing Applied: Aquacel Ag 5. Secondary Dressing Applied Bordered Foam Dressing Dry Gauze Electronic Signature(s) Signed: 07/03/2016 4:37:28 PM By: Montey Hora Entered By: Montey Hora on 07/03/2016 09:36:22 [name] ([id number]) -------------------------------------------------------------------------------- [place] Details Patient Name: [name] Date of Service: 07/03/2016 8:00 AM Medical Record Number: [id number] Patient Account Number: [id number] Date of Birth/Sex: Nov 02, 1945 (71 y.o. Male) Treating RN: Montey Hora Primary Care Kyara Boxer: Thomes Lolling Other Clinician: Referring Jozey Janco: Treating Nickalus Thornsberry/Extender: Frann Rider in Treatment: 0 Vital Signs Time Taken: 08:16 Temperature (F): 97.6 Height (in): 65 Pulse (bpm): 76 Source: Measured Respiratory Rate (breaths/min): 18 Weight (lbs): 161 Blood Pressure (mmHg): 194/92 Source: Measured Reference Range: 80 - 120 mg / dl Body Mass Index (BMI): 26.8 Notes patient states that he just took his BP pill on his way out of his house Electronic Signature(s) Signed: 07/03/2016 4:37:28 PM By: Montey Hora Entered By: Montey Hora on 07/03/2016 08:17:18

## 2016-07-10 ENCOUNTER — Encounter: Payer: Medicare Other | Admitting: Surgery

## 2016-07-10 DIAGNOSIS — E11622 Type 2 diabetes mellitus with other skin ulcer: Secondary | ICD-10-CM | POA: Diagnosis not present

## 2016-07-11 NOTE — Progress Notes (Signed)
[name], [name] ([id number]) Visit Report for 07/10/2016 Arrival Information Details Patient Name: [name], [name]. Date of Service: 07/10/2016 11:00 AM Medical Record Number: [id number] Patient Account Number: [id number] Date of Birth/Sex: [birth year] (71 y.o. Male) Treating RN: Montey Hora Primary Care Carlin Mamone: Thomes Lolling Other Clinician: Referring Michaelah Credeur: Thomes Lolling Treating Kinzlee Selvy/Extender: Frann Rider in Treatment: 1 Visit Information History Since Last Visit Added or deleted any medications: No Patient Arrived: Ambulatory Any new allergies or adverse reactions: No Arrival Time: 10:40 Had a fall or experienced change in No Accompanied By: self activities of daily living that may affect Transfer Assistance: None risk of falls: Patient Identification Verified: Yes Signs or symptoms of abuse/neglect since last No Secondary Verification Process Yes visito Completed: Hospitalized since last visit: No Patient Has Alerts: Yes Has Dressing in Place as Prescribed: Yes Patient Alerts: DMII Pain Present Now: No Electronic Signature(s) Signed: 07/10/2016 5:00:37 PM By: Montey Hora Entered By: Montey Hora on 07/10/2016 10:40:46 [name] ([id number]) -------------------------------------------------------------------------------- Clinic Level of Care Assessment Details Patient Name: [name] Date of Service: 07/10/2016 11:00 AM Medical Record Number: [id number] Patient Account Number: [id number] Date of Birth/Sex: 10-17-1945 (71 y.o. Male) Treating RN: Montey Hora Primary Care Trinten Boudoin: Thomes Lolling Other Clinician: Referring Treysean Petruzzi: Thomes Lolling Treating Syd Manges/Extender: Frann Rider in Treatment: 1 Clinic Level of Care Assessment Items TOOL 4 Quantity Score '[]'$  - Use when only an EandM is performed on FOLLOW-UP visit 0 ASSESSMENTS - Nursing Assessment / Reassessment X - Reassessment of Co-morbidities (includes updates in  patient status) 1 10 X - Reassessment of Adherence to Treatment Plan 1 5 ASSESSMENTS - Wound and Skin Assessment / Reassessment '[]'$  - Simple Wound Assessment / Reassessment - one wound 0 X - Complex Wound Assessment / Reassessment - multiple wounds 2 5 '[]'$  - Dermatologic / Skin Assessment (not related to wound area) 0 ASSESSMENTS - Focused Assessment '[]'$  - Circumferential Edema Measurements - multi extremities 0 '[]'$  - Nutritional Assessment / Counseling / Intervention 0 '[]'$  - Lower Extremity Assessment (monofilament, tuning fork, pulses) 0 '[]'$  - Peripheral Arterial Disease Assessment (using hand held doppler) 0 ASSESSMENTS - Ostomy and/or Continence Assessment and Care '[]'$  - Incontinence Assessment and Management 0 '[]'$  - Ostomy Care Assessment and Management (repouching, etc.) 0 PROCESS - Coordination of Care X - Simple Patient / Family Education for ongoing care 1 15 '[]'$  - Complex (extensive) Patient / Family Education for ongoing care 0 '[]'$  - Staff obtains Programmer, systems, Records, Test Results / Process Orders 0 '[]'$  - Staff telephones HHA, Nursing Homes / Clarify orders / etc 0 '[]'$  - Routine Transfer to another Facility (non-emergent condition) 0 [name], [name] ([id number]) '[]'$  - Routine Hospital Admission (non-emergent condition) 0 '[]'$  - New Admissions / Biomedical engineer / Ordering NPWT, Apligraf, etc. 0 '[]'$  - Emergency Hospital Admission (emergent condition) 0 X - Simple Discharge Coordination 1 10 '[]'$  - Complex (extensive) Discharge Coordination 0 PROCESS - Special Needs '[]'$  - Pediatric / Minor Patient Management 0 '[]'$  - Isolation Patient Management 0 '[]'$  - Hearing / Language / Visual special needs 0 '[]'$  - Assessment of Community assistance (transportation, D/C planning, etc.) 0 '[]'$  - Additional assistance / Altered mentation 0 '[]'$  - Support Surface(s) Assessment (bed, cushion, seat, etc.) 0 INTERVENTIONS - Wound Cleansing / Measurement '[]'$  - Simple Wound Cleansing - one wound 0 X - Complex Wound  Cleansing - multiple wounds 2 5 X - Wound Imaging (photographs - any number of wounds) 1 5 '[]'$  - Wound Tracing (instead  of photographs) 0 '[]'$  - Simple Wound Measurement - one wound 0 X - Complex Wound Measurement - multiple wounds 2 5 INTERVENTIONS - Wound Dressings X - Small Wound Dressing one or multiple wounds 2 10 '[]'$  - Medium Wound Dressing one or multiple wounds 0 '[]'$  - Large Wound Dressing one or multiple wounds 0 '[]'$  - Application of Medications - topical 0 '[]'$  - Application of Medications - injection 0 INTERVENTIONS - Miscellaneous '[]'$  - External ear exam 0 [name], [name] ([id number]) '[]'$  - Specimen Collection (cultures, biopsies, blood, body fluids, etc.) 0 '[]'$  - Specimen(s) / Culture(s) sent or taken to Lab for analysis 0 '[]'$  - Patient Transfer (multiple staff / Harrel Lemon Lift / Similar devices) 0 '[]'$  - Simple Staple / Suture removal (25 or less) 0 '[]'$  - Complex Staple / Suture removal (26 or more) 0 '[]'$  - Hypo / Hyperglycemic Management (close monitor of Blood Glucose) 0 '[]'$  - Ankle / Brachial Index (ABI) - do not check if billed separately 0 X - Vital Signs 1 5 Has the patient been seen at the hospital within the last three years: Yes Total Score: 100 Level Of Care: New/Established - Level 3 Electronic Signature(s) Signed: 07/10/2016 5:00:37 PM By: Montey Hora Entered By: Montey Hora on 07/10/2016 10:56:40 [name] ([id number]) -------------------------------------------------------------------------------- Encounter Discharge Information Details Patient Name: [name] Date of Service: 07/10/2016 11:00 AM Medical Record Number: [id number] Patient Account Number: [id number] Date of Birth/Sex: 03-03-1946 (71 y.o. Male) Treating RN: Montey Hora Primary Care Cherrish Vitali: Thomes Lolling Other Clinician: Referring Celest Reitz: Thomes Lolling Treating Yeny Schmoll/Extender: Frann Rider in Treatment: 1 Encounter Discharge Information Items Discharge Pain Level:  0 Discharge Condition: Stable Ambulatory Status: Ambulatory Discharge Destination: Home Transportation: Private Auto Accompanied By: self Schedule Follow-up Appointment: Yes Medication Reconciliation completed and provided to Patient/Care No Alyxander Kollmann: Provided on Clinical Summary of Care: 07/10/2016 Form Type Recipient Paper Patient FB Electronic Signature(s) Signed: 07/10/2016 11:00:43 AM By: [name] Entered By: [name] on 07/10/2016 11:00:43 [name] ([id number]) -------------------------------------------------------------------------------- General Visit Notes Details Patient Name: [name] Date of Service: 07/10/2016 11:00 AM Medical Record Number: [id number] Patient Account Number: [id number] Date of Birth/Sex: 09-09-1945 (71 y.o. Male) Treating RN: Montey Hora Primary Care Shannin Naab: Thomes Lolling Other Clinician: Referring Ahsan Esterline: Thomes Lolling Treating Jarnell Cordaro/Extender: Frann Rider in Treatment: 1 Notes CBG log given to patient and patient educated to record blood sugars Electronic Signature(s) Signed: 07/10/2016 5:00:37 PM By: Montey Hora Entered By: Montey Hora on 07/10/2016 11:02:25 [name] ([id number]) -------------------------------------------------------------------------------- Multi Wound Chart Details Patient Name: [name] Date of Service: 07/10/2016 11:00 AM Medical Record Number: [id number] Patient Account Number: [id number] Date of Birth/Sex: 13-Jun-1945 (71 y.o. Male) Treating RN: Montey Hora Primary Care Neven Fina: Thomes Lolling Other Clinician: Referring Cerrone Debold: Thomes Lolling Treating Teddy Pena/Extender: Frann Rider in Treatment: 1 Vital Signs Height(in): 65 Pulse(bpm): 94 Weight(lbs): 161 Blood Pressure 159/87 (mmHg): Body Mass Index(BMI): 27 Temperature(F): 97.8 Respiratory Rate 18 (breaths/min): Photos: [1:No Photos] [2:No Photos] [N/A:N/A] Wound Location: [1:Right  Abdomen - midline] [2:Left Abdomen - midline] [N/A:N/A] Wounding Event: [1:Gradually Appeared] [2:Gradually Appeared] [N/A:N/A] Primary Etiology: [1:Atypical] [2:Atypical] [N/A:N/A] Comorbid History: [1:Cataracts, Chronic Obstructive Pulmonary Disease (COPD), Hypertension, Type II Diabetes, Osteoarthritis, Neuropathy] [2:Cataracts, Chronic Obstructive Pulmonary Disease (COPD), Hypertension, Type II Diabetes, Osteoarthritis,  Neuropathy] [N/A:N/A] Date Acquired: [1:05/17/2015] [2:05/17/2015] [N/A:N/A] Weeks of Treatment: [1:1] [2:1] [N/A:N/A] Wound Status: [1:Open] [2:Open] [N/A:N/A] Measurements L x W x D 1.5x0.6x0.1 [2:4.5x0.9x0.1] [N/A:N/A] (cm) Area (cm) : [8:1.829] [2:3.181] [N/A:N/A] Volume (  cm) : [1:0.071] [2:0.318] [N/A:N/A] % Reduction in Area: [1:79.40%] [2:39.70%] [N/A:N/A] % Reduction in Volume: 79.30% [2:39.80%] [N/A:N/A] Classification: [1:Full Thickness Without Exposed Support Structures] [2:Full Thickness Without Exposed Support Structures] [N/A:N/A] Exudate Amount: [1:Medium] [2:Medium] [N/A:N/A] Exudate Type: [1:Serous] [2:Serous] [N/A:N/A] Exudate Color: [1:amber] [2:amber] [N/A:N/A] Wound Margin: [1:Flat and Intact] [2:Flat and Intact] [N/A:N/A] Granulation Amount: [1:Large (67-100%)] [2:Large (67-100%)] [N/A:N/A] Granulation Quality: [1:Red] [2:Red] [N/A:N/A] Necrotic Amount: [1:Small (1-33%)] [2:Small (1-33%)] [N/A:N/A] Necrotic Tissue: [1:Eschar] [2:Eschar] [N/A:N/A] Exposed Structures: Fascia: No Fascia: No N/A Fat Layer (Subcutaneous Fat Layer (Subcutaneous Tissue) Exposed: No Tissue) Exposed: No Tendon: No Tendon: No Muscle: No Muscle: No Joint: No Joint: No Bone: No Bone: No Limited to Skin Limited to Skin Breakdown Breakdown Epithelialization: None None N/A Periwound Skin Texture: Excoriation: No Excoriation: No N/A Induration: No Induration: No Callus: No Callus: No Crepitus: No Crepitus: No Rash: No Rash: No Scarring: No Scarring:  No Periwound Skin Maceration: No Maceration: No N/A Moisture: Dry/Scaly: No Dry/Scaly: No Periwound Skin Color: Atrophie Blanche: No Atrophie Blanche: No N/A Cyanosis: No Cyanosis: No Ecchymosis: No Ecchymosis: No Erythema: No Erythema: No Hemosiderin Staining: No Hemosiderin Staining: No Mottled: No Mottled: No Pallor: No Pallor: No Rubor: No Rubor: No Temperature: No Abnormality No Abnormality N/A Tenderness on No No N/A Palpation: Wound Preparation: Ulcer Cleansing: Ulcer Cleansing: N/A Rinsed/Irrigated with Rinsed/Irrigated with Saline Saline Topical Anesthetic Topical Anesthetic Applied: Other: lidocaine Applied: Other: lidocaine 4% 4% Treatment Notes Wound #1 (Right Abdomen - midline) 1. Cleansed with: Clean wound with Normal Saline 2. Anesthetic Topical Lidocaine 4% cream to wound bed prior to debridement 4. Dressing Applied: Aquacel Ag 5. Secondary Dent #2 (Left Abdomen - midline) [name], [name]. ([id number]) 1. Cleansed with: Clean wound with Normal Saline 2. Anesthetic Topical Lidocaine 4% cream to wound bed prior to debridement 4. Dressing Applied: Aquacel Ag 5. Secondary [place] Signature(s) Signed: 07/10/2016 11:07:33 AM By: Christin Fudge MD, FACS Entered By: Christin Fudge on 07/10/2016 11:07:33 [name] ([id number]) -------------------------------------------------------------------------------- Cable Details Patient Name: [name], [name] Date of Service: 07/10/2016 11:00 AM Medical Record Number: [id number] Patient Account Number: [id number] Date of Birth/Sex: 01/17/1946 (71 y.o. Male) Treating RN: Montey Hora Primary Care Johnwesley Lederman: Thomes Lolling Other Clinician: Referring Daking Westervelt: Thomes Lolling Treating Dason Mosley/Extender: Frann Rider in Treatment: 1 Active Inactive ` Abuse / Safety / Falls / Self Care Management Nursing  Diagnoses: Impaired physical mobility Goals: Patient will remain injury free Date Initiated: 07/03/2016 Target Resolution Date: 09/14/2016 Goal Status: Active Interventions: Assess fall risk on admission and as needed Notes: ` Orientation to the Wound Care Program Nursing Diagnoses: Knowledge deficit related to the wound healing center program Goals: Patient/caregiver will verbalize understanding of the [facility] Date Initiated: 07/03/2016 Target Resolution Date: 09/15/2016 Goal Status: Active Interventions: Provide education on orientation to the wound center Notes: ` Wound/Skin Impairment Nursing Diagnoses: Impaired tissue integrity [name], [name] ([id number]) Goals: Patient/caregiver will verbalize understanding of skin care regimen Date Initiated: 07/03/2016 Target Resolution Date: 09/15/2016 Goal Status: Active Ulcer/skin breakdown will have a volume reduction of 30% by week 4 Date Initiated: 07/03/2016 Target Resolution Date: 09/15/2016 Goal Status: Active Ulcer/skin breakdown will have a volume reduction of 50% by week 8 Date Initiated: 07/03/2016 Target Resolution Date: 09/15/2016 Goal Status: Active Ulcer/skin breakdown will have a volume reduction of 80% by week 12 Date Initiated: 07/03/2016 Target Resolution Date: 09/15/2016 Goal Status: Active Ulcer/skin breakdown will heal within 14 weeks Date  Initiated: 07/03/2016 Target Resolution Date: 09/15/2016 Goal Status: Active Interventions: Assess patient/caregiver ability to obtain necessary supplies Assess patient/caregiver ability to perform ulcer/skin care regimen upon admission and as needed Assess ulceration(s) every visit Notes: Electronic Signature(s) Signed: 07/10/2016 5:00:37 PM By: Montey Hora Entered By: Montey Hora on 07/10/2016 10:54:07 [name] ([id number]) -------------------------------------------------------------------------------- Pain Assessment Details Patient Name:  [name] Date of Service: 07/10/2016 11:00 AM Medical Record Number: [id number] Patient Account Number: [id number] Date of Birth/Sex: September 29, 1945 (71 y.o. Male) Treating RN: Montey Hora Primary Care Teyanna Thielman: Thomes Lolling Other Clinician: Referring Semir Brill: Thomes Lolling Treating Niranjan Rufener/Extender: Frann Rider in Treatment: 1 Active Problems Location of Pain Severity and Description of Pain Patient Has Paino No Site Locations Pain Management and Medication Current Pain Management: Notes Topical or injectable lidocaine is offered to patient for acute pain when surgical debridement is performed. If needed, Patient is instructed to use over the counter pain medication for the following 24-48 hours after debridement. Wound care MDs do not prescribed pain medications. Patient has chronic pain or uncontrolled pain. Patient has been instructed to make an appointment with their Primary Care Physician for pain management. Electronic Signature(s) Signed: 07/10/2016 5:00:37 PM By: Montey Hora Entered By: Montey Hora on 07/10/2016 10:40:53 [name] ([id number]) -------------------------------------------------------------------------------- Patient/Caregiver Education Details Patient Name: DARRELLE, WIBERG Date of Service: 07/10/2016 11:00 AM Medical Record Number: [id number] Patient Account Number: [id number] Date of Birth/Gender: [birth year] (71 y.o. Male) Treating RN: Montey Hora Primary Care Physician: Thomes Lolling Other Clinician: Referring Physician: Thomes Lolling Treating Physician/Extender: Frann Rider in Treatment: 1 Education Assessment Education Provided To: Patient Education Topics Provided Wound/Skin Impairment: Handouts: Other: wound care to continue as ordered Methods: Demonstration, Explain/Verbal Responses: State content correctly Electronic Signature(s) Signed: 07/10/2016 5:00:37 PM By: Montey Hora Entered By: Montey Hora  on 07/10/2016 10:56:03 [name] ([id number]) -------------------------------------------------------------------------------- Wound Assessment Details Patient Name: [name] Date of Service: 07/10/2016 11:00 AM Medical Record Number: [id number] Patient Account Number: [id number] Date of Birth/Sex: [birth year] (71 y.o. Male) Treating RN: Montey Hora Primary Care Tennile Styles: Thomes Lolling Other Clinician: Referring Taler Kushner: Thomes Lolling Treating Patrena Santalucia/Extender: Frann Rider in Treatment: 1 Wound Status Wound Number: 1 Primary Atypical Etiology: Wound Location: Right Abdomen - midline Wound Open Wounding Event: Gradually Appeared Status: Date Acquired: 05/17/2015 Comorbid Cataracts, Chronic Obstructive Weeks Of Treatment: 1 History: Pulmonary Disease (COPD), Clustered Wound: No Hypertension, Type II Diabetes, Osteoarthritis, Neuropathy Photos Photo Uploaded By: Montey Hora on 07/10/2016 13:01:46 Wound Measurements Length: (cm) 1.5 Width: (cm) 0.6 Depth: (cm) 0.1 Area: (cm) 0.707 Volume: (cm) 0.071 % Reduction in Area: 79.4% % Reduction in Volume: 79.3% Epithelialization: None Tunneling: No Undermining: No Wound Description Full Thickness Without Exposed Classification: Support Structures Wound Margin: Flat and Intact Exudate Medium Amount: Exudate Type: Serous Exudate Color: amber Foul Odor After Cleansing: No Slough/Fibrino No Wound Bed Granulation Amount: Large (67-100%) Exposed Structure [name], [name] ([id number]) Granulation Quality: Red Fascia Exposed: No Necrotic Amount: Small (1-33%) Fat Layer (Subcutaneous Tissue) Exposed: No Necrotic Quality: Eschar Tendon Exposed: No Muscle Exposed: No Joint Exposed: No Bone Exposed: No Limited to Skin Breakdown Periwound Skin Texture Texture Color No Abnormalities Noted: No No Abnormalities Noted: No Callus: No Atrophie Blanche: No Crepitus: No Cyanosis: No Excoriation:  No Ecchymosis: No Induration: No Erythema: No Rash: No Hemosiderin Staining: No Scarring: No Mottled: No Pallor: No Moisture Rubor: No No Abnormalities Noted: No Dry / Scaly: No Temperature / Pain Maceration: No Temperature: No Abnormality Wound Preparation Ulcer  Cleansing: Rinsed/Irrigated with Saline Topical Anesthetic Applied: Other: lidocaine 4%, Treatment Notes Wound #1 (Right Abdomen - midline) 1. Cleansed with: Clean wound with Normal Saline 2. Anesthetic Topical Lidocaine 4% cream to wound bed prior to debridement 4. Dressing Applied: Aquacel Ag 5. Secondary [place] Signature(s) Signed: 07/10/2016 5:00:37 PM By: Montey Hora Entered By: Montey Hora on 07/10/2016 10:53:19 [name] ([id number]) -------------------------------------------------------------------------------- Wound Assessment Details Patient Name: [name], [name] Date of Service: 07/10/2016 11:00 AM Medical Record Number: [id number] Patient Account Number: [id number] Date of Birth/Sex: [birth year] (71 y.o. Male) Treating RN: Montey Hora Primary Care Guila Owensby: Thomes Lolling Other Clinician: Referring Ludivina Guymon: Thomes Lolling Treating Eternity Dexter/Extender: Frann Rider in Treatment: 1 Wound Status Wound Number: 2 Primary Atypical Etiology: Wound Location: Left Abdomen - midline Wound Open Wounding Event: Gradually Appeared Status: Date Acquired: 05/17/2015 Comorbid Cataracts, Chronic Obstructive Weeks Of Treatment: 1 History: Pulmonary Disease (COPD), Clustered Wound: No Hypertension, Type II Diabetes, Osteoarthritis, Neuropathy Photos Photo Uploaded By: Montey Hora on 07/10/2016 13:01:47 Wound Measurements Length: (cm) 4.5 Width: (cm) 0.9 Depth: (cm) 0.1 Area: (cm) 3.181 Volume: (cm) 0.318 % Reduction in Area: 39.7% % Reduction in Volume: 39.8% Epithelialization: None Tunneling: No Undermining: No Wound Description Full  Thickness Without Exposed Classification: Support Structures Wound Margin: Flat and Intact Exudate Medium Amount: Exudate Type: Serous Exudate Color: amber Foul Odor After Cleansing: No Slough/Fibrino No Wound Bed Granulation Amount: Large (67-100%) Exposed Structure [name], [name] ([id number]) Granulation Quality: Red Fascia Exposed: No Necrotic Amount: Small (1-33%) Fat Layer (Subcutaneous Tissue) Exposed: No Necrotic Quality: Eschar Tendon Exposed: No Muscle Exposed: No Joint Exposed: No Bone Exposed: No Limited to Skin Breakdown Periwound Skin Texture Texture Color No Abnormalities Noted: No No Abnormalities Noted: No Callus: No Atrophie Blanche: No Crepitus: No Cyanosis: No Excoriation: No Ecchymosis: No Induration: No Erythema: No Rash: No Hemosiderin Staining: No Scarring: No Mottled: No Pallor: No Moisture Rubor: No No Abnormalities Noted: No Dry / Scaly: No Temperature / Pain Maceration: No Temperature: No Abnormality Wound Preparation Ulcer Cleansing: Rinsed/Irrigated with Saline Topical Anesthetic Applied: Other: lidocaine 4%, Treatment Notes Wound #2 (Left Abdomen - midline) 1. Cleansed with: Clean wound with Normal Saline 2. Anesthetic Topical Lidocaine 4% cream to wound bed prior to debridement 4. Dressing Applied: Aquacel Ag 5. Secondary [name] Signature(s) Signed: 07/10/2016 5:00:37 PM By: Montey Hora Entered By: Montey Hora on 07/10/2016 10:53:49 [name] ([id number]) -------------------------------------------------------------------------------- [place] Details Patient Name: [name], [name] Date of Service: 07/10/2016 11:00 AM Medical Record Number: [id number] Patient Account Number: [id number] Date of Birth/Sex: [birth year] (71 y.o. Male) Treating RN: Montey Hora Primary Care Josselyn Harkins: Thomes Lolling Other Clinician: Referring Aizlynn Digilio: Thomes Lolling Treating Annalie Wenner/Extender:  Frann Rider in Treatment: 1 Vital Signs Time Taken: 10:41 Temperature (F): 97.8 Height (in): 65 Pulse (bpm): 94 Weight (lbs): 161 Respiratory Rate (breaths/min): 18 Body Mass Index (BMI): 26.8 Blood Pressure (mmHg): 159/87 Reference Range: 80 - 120 mg / dl Electronic Signature(s) Signed: 07/10/2016 5:00:37 PM By: Montey Hora Entered By: Montey Hora on 07/10/2016 10:42:56

## 2016-07-11 NOTE — Progress Notes (Signed)
KARLIS, CREGG ([id number]) Visit Report for 07/10/2016 Chief Complaint Document Details Patient Name: [name], [name]. Date of Service: 07/10/2016 11:00 AM Medical Record Number: [id number] Patient Account Number: [id number] Date of Birth/Sex: 1945-03-22 (71 y.o. Male) Treating RN: Montey Hora Primary Care Provider: Thomes Lolling Other Clinician: Referring Provider: Thomes Lolling Treating Provider/Extender: Frann Rider in Treatment: 1 Information Obtained from: Patient Chief Complaint Patients presents for treatment of an open diabetic ulcer to the anterior abdominal wall in the epigastric and right upper quadrant which she's had for over 5 years Electronic Signature(s) Signed: 07/10/2016 11:07:43 AM By: Christin Fudge MD, FACS Entered By: Christin Fudge on 07/10/2016 11:07:43 [name] ([id number]) -------------------------------------------------------------------------------- HPI Details Patient Name: [name] Date of Service: 07/10/2016 11:00 AM Medical Record Number: [id number] Patient Account Number: [id number] Date of Birth/Sex: [birth year] (71 y.o. Male) Treating RN: Montey Hora Primary Care Provider: Thomes Lolling Other Clinician: Referring Provider: Thomes Lolling Treating Provider/Extender: Frann Rider in Treatment: 1 History of Present Illness Location: abdomen wall wounds,-- right upper quadrant and epigastric region Quality: Patient reports experiencing a dull pain to affected area(s). Severity: Patient states wound are getting worse. Duration: Patient has had the wound for >5 year's prior to seeking treatment at the wound center Timing: Pain in wound is Intermittent (comes and goes Context: The wound appeared gradually over time Modifying Factors: Consults to this date include:not seen a surgeon and only sees his PCP for his medical complaints Associated Signs and Symptoms: Patient reports having increase discharge. HPI Description:  71 year old gentleman has been seen recently by his PCP Dr. Thomes Lolling, who sees him with a history of diabetes mellitus, peripheral neuropathy, B12 deficiency, hypertension, osteoarthritis and the patient had recent blood sugar checked which was 163. Past medical history includes essential hypertension, incisional hernia, diabetes mellitus, colon polyps, COPD, tobacco abuse in the past, chewing tobacco, GERD. He has also been treated for chronic back pain with no sciatica and is on oxycodone. His last hemoglobin A1c was 6.9% His abdominal surgery was done at [facility] over 15 years ago and gradually there was sutures which protruded and then there was mesh which protruded. He washes this during his shower but does not put any dressing and the wound is covered with a lot of debris. He has never had a surgical consultation for this. 07/10/2016 -- the patient has spoken to his PCP who said he was going to call me but I have not yet had a phone conversation with the physician. From what I understand the physician was reluctant to have a surgical opinion because of the patient's several comorbidities and he feels that the surgery may be detrimental to his overall health Electronic Signature(s) Signed: 07/10/2016 11:08:45 AM By: Christin Fudge MD, FACS Previous Signature: 07/10/2016 11:08:23 AM Version By: Christin Fudge MD, FACS Entered By: Christin Fudge on 07/10/2016 11:08:44 [name] ([id number]) -------------------------------------------------------------------------------- Physical Exam Details Patient Name: [name], [name]. Date of Service: 07/10/2016 11:00 AM Medical Record Number: [id number] Patient Account Number: [id number] Date of Birth/Sex: 01/31/1946 (71 y.o. Male) Treating RN: Montey Hora Primary Care Provider: Thomes Lolling Other Clinician: Referring Provider: Thomes Lolling Treating Provider/Extender: Frann Rider in Treatment: 1 Constitutional . Pulse regular.  Respirations normal and unlabored. Afebrile. . Eyes Nonicteric. Reactive to light. Ears, Nose, Mouth, and Throat Lips, teeth, and gums WNL.[name] Moist mucosa without lesions. Neck supple and nontender. No palpable supraclavicular or cervical adenopathy. Normal sized without goiter. Respiratory WNL. No retractions.. Breath sounds WNL,  No rubs, rales, rhonchi, or wheeze.. Cardiovascular Heart rhythm and rate regular, no murmur or gallop.. Pedal Pulses WNL. No clubbing, cyanosis or edema. Chest Breasts symmetical and no nipple discharge.. Breast tissue WNL, no masses, lumps, or tenderness.. Lymphatic No adneopathy. No adenopathy. No adenopathy. Musculoskeletal Adexa without tenderness or enlargement.. Digits and nails w/o clubbing, cyanosis, infection, petechiae, ischemia, or inflammatory conditions.. Integumentary (Hair, Skin) No suspicious lesions. No crepitus or fluctuance. No peri-wound warmth or erythema. No masses.[name] Psychiatric Judgement and insight Intact.. No evidence of depression, anxiety, or agitation.. Notes the patient continues to have the large incisional hernia on the anterior abdominal want and the right upper quadrant. Areas which I had debrided last week overall looked very clean and though there is some superficial debris and palpable mesh no sharp debridement was required today. We saline gauze were used to wash off the wound Electronic Signature(s) Signed: 07/10/2016 11:12:15 AM By: Christin Fudge MD, FACS Entered By: Christin Fudge on 07/10/2016 11:12:14 [name] ([id number]) -------------------------------------------------------------------------------- Physician Orders Details Patient Name: [name] Date of Service: 07/10/2016 11:00 AM Medical Record Number: [id number] Patient Account Number: [id number] Date of Birth/Sex: 1945-05-07 (71 y.o. Male) Treating RN: Montey Hora Primary Care Provider: Thomes Lolling Other Clinician: Referring Provider:  Thomes Lolling Treating Provider/Extender: Frann Rider in Treatment: 1 Verbal / Phone Orders: No Diagnosis Coding Wound Cleansing Wound #1 Right Abdomen - midline o Clean wound with Normal Saline. o May Shower, gently pat wound dry prior to applying new dressing. Wound #2 Left Abdomen - midline o Clean wound with Normal Saline. o May Shower, gently pat wound dry prior to applying new dressing. Anesthetic Wound #1 Right Abdomen - midline o Topical Lidocaine 4% cream applied to wound bed prior to debridement Wound #2 Left Abdomen - midline o Topical Lidocaine 4% cream applied to wound bed prior to debridement Skin Barriers/Peri-Wound Care Wound #1 Right Abdomen - midline o Skin Prep Wound #2 Left Abdomen - midline o Skin Prep Primary Wound Dressing Wound #1 Right Abdomen - midline o Aquacel Ag Wound #2 Left Abdomen - midline o Aquacel Ag Secondary Dressing Wound #1 Right Abdomen - midline o Boardered Foam Dressing Wound #2 Left Abdomen - midline o Boardered Foam Dressing [name], [name] ([id number]) Dressing Change Frequency Wound #1 Right Abdomen - midline o Change dressing every other day. Wound #2 Left Abdomen - midline o Change dressing every other day. Follow-up Appointments Wound #1 Right Abdomen - midline o Return Appointment in 1 week. Wound #2 Left Abdomen - midline o Return Appointment in 1 week. Electronic Signature(s) Signed: 07/10/2016 4:45:53 PM By: Christin Fudge MD, FACS Signed: 07/10/2016 5:00:37 PM By: Montey Hora Entered By: Montey Hora on 07/10/2016 10:54:47 [name] ([id number]) -------------------------------------------------------------------------------- Problem List Details Patient Name: EDY, BELT Date of Service: 07/10/2016 11:00 AM Medical Record Number: [id number] Patient Account Number: [id number] Date of Birth/Sex: [birth year] (71 y.o. Male) Treating RN: Montey Hora Primary Care  Provider: Thomes Lolling Other Clinician: Referring Provider: Thomes Lolling Treating Provider/Extender: Frann Rider in Treatment: 1 Active Problems ICD-10 Encounter Code Description Active Date Diagnosis E11.622 Type 2 diabetes mellitus with other skin ulcer 07/03/2016 Yes S31.100A Unspecified open wound of abdominal wall, right upper 07/03/2016 Yes quadrant without penetration into peritoneal cavity, initial encounter S31.102A Unspecified open wound of abdominal wall, epigastric 07/03/2016 Yes region without penetration into peritoneal cavity, initial encounter F17.228 Nicotine dependence, chewing tobacco, with other 07/03/2016 Yes nicotine-induced disorders Inactive Problems Resolved Problems Electronic Signature(s) Signed: 07/10/2016  11:07:27 AM By: Christin Fudge MD, FACS Entered By: Christin Fudge on 07/10/2016 11:07:26 [name] ([id number]) -------------------------------------------------------------------------------- Progress Note Details Patient Name: [name], [name] Date of Service: 07/10/2016 11:00 AM Medical Record Number: [id number] Patient Account Number: [id number] Date of Birth/Sex: 04/14/1945 (71 y.o. Male) Treating RN: Montey Hora Primary Care Provider: Thomes Lolling Other Clinician: Referring Provider: Thomes Lolling Treating Provider/Extender: Frann Rider in Treatment: 1 Subjective Chief Complaint Information obtained from Patient Patients presents for treatment of an open diabetic ulcer to the anterior abdominal wall in the epigastric and right upper quadrant which she's had for over 5 years History of Present Illness (HPI) The following HPI elements were documented for the patient's wound: Location: abdomen wall wounds,-- right upper quadrant and epigastric region Quality: Patient reports experiencing a dull pain to affected area(s). Severity: Patient states wound are getting worse. Duration: Patient has had the wound for >5 year's  prior to seeking treatment at the wound center Timing: Pain in wound is Intermittent (comes and goes Context: The wound appeared gradually over time Modifying Factors: Consults to this date include:not seen a surgeon and only sees his PCP for his medical complaints Associated Signs and Symptoms: Patient reports having increase discharge. 71 year old gentleman has been seen recently by his PCP Dr. Thomes Lolling, who sees him with a history of diabetes mellitus, peripheral neuropathy, B12 deficiency, hypertension, osteoarthritis and the patient had recent blood sugar checked which was 163. Past medical history includes essential hypertension, incisional hernia, diabetes mellitus, colon polyps, COPD, tobacco abuse in the past, chewing tobacco, GERD. He has also been treated for chronic back pain with no sciatica and is on oxycodone. His last hemoglobin A1c was 6.9% His abdominal surgery was done at [facility] over 15 years ago and gradually there was sutures which protruded and then there was mesh which protruded. He washes this during his shower but does not put any dressing and the wound is covered with a lot of debris. He has never had a surgical consultation for this. 07/10/2016 -- the patient has spoken to his PCP who said he was going to call me but I have not yet had a phone conversation with the physician. From what I understand the physician was reluctant to have a surgical opinion because of the patient's several comorbidities and he feels that the surgery may be detrimental to his overall health [name], [name]. ([id number]) Objective Constitutional Pulse regular. Respirations normal and unlabored. Afebrile. Vitals Time Taken: 10:41 AM, Height: 65 in, Weight: 161 lbs, BMI: 26.8, Temperature: 97.8 F, Pulse: 94 bpm, Respiratory Rate: 18 breaths/min, Blood Pressure: 159/87 mmHg. Eyes Nonicteric. Reactive to light. Ears, Nose, Mouth, and Throat Lips, teeth, and gums WNL.[name] Moist  mucosa without lesions. Neck supple and nontender. No palpable supraclavicular or cervical adenopathy. Normal sized without goiter. Respiratory WNL. No retractions.. Breath sounds WNL, No rubs, rales, rhonchi, or wheeze.. Cardiovascular Heart rhythm and rate regular, no murmur or gallop.. Pedal Pulses WNL. No clubbing, cyanosis or edema. Chest Breasts symmetical and no nipple discharge.. Breast tissue WNL, no masses, lumps, or tenderness.. Lymphatic No adneopathy. No adenopathy. No adenopathy. Musculoskeletal Adexa without tenderness or enlargement.. Digits and nails w/o clubbing, cyanosis, infection, petechiae, ischemia, or inflammatory conditions.[name] Psychiatric Judgement and insight Intact.. No evidence of depression, anxiety, or agitation.. General Notes: the patient continues to have the large incisional hernia on the anterior abdominal want and the right upper quadrant. Areas which I had debrided last week overall looked very clean and though  there is some superficial debris and palpable mesh no sharp debridement was required today. We saline gauze were used to wash off the wound Integumentary (Hair, Skin) No suspicious lesions. No crepitus or fluctuance. No peri-wound warmth or erythema. No masses.. Wound #1 status is Open. Original cause of wound was Gradually Appeared. The wound is located on the Right Abdomen - midline. The wound measures 1.5cm length x 0.6cm width x 0.1cm depth; 0.707cm^2 area and 0.071cm^3 volume. The wound is limited to skin breakdown. There is no tunneling or undermining [name], [name]. ([id number]) noted. There is a medium amount of serous drainage noted. The wound margin is flat and intact. There is large (67-100%) red granulation within the wound bed. There is a small (1-33%) amount of necrotic tissue within the wound bed including Eschar. The periwound skin appearance did not exhibit: Callus, Crepitus, Excoriation, Induration, Rash, Scarring, Dry/Scaly,  Maceration, Atrophie Blanche, Cyanosis, Ecchymosis, Hemosiderin Staining, Mottled, Pallor, Rubor, Erythema. Periwound temperature was noted as No Abnormality. Wound #2 status is Open. Original cause of wound was Gradually Appeared. The wound is located on the Left Abdomen - midline. The wound measures 4.5cm length x 0.9cm width x 0.1cm depth; 3.181cm^2 area and 0.318cm^3 volume. The wound is limited to skin breakdown. There is no tunneling or undermining noted. There is a medium amount of serous drainage noted. The wound margin is flat and intact. There is large (67-100%) red granulation within the wound bed. There is a small (1-33%) amount of necrotic tissue within the wound bed including Eschar. The periwound skin appearance did not exhibit: Callus, Crepitus, Excoriation, Induration, Rash, Scarring, Dry/Scaly, Maceration, Atrophie Blanche, Cyanosis, Ecchymosis, Hemosiderin Staining, Mottled, Pallor, Rubor, Erythema. Periwound temperature was noted as No Abnormality. Assessment Active Problems ICD-10 E11.622 - Type 2 diabetes mellitus with other skin ulcer S31.100A - Unspecified open wound of abdominal wall, right upper quadrant without penetration into peritoneal cavity, initial encounter S31.102A - Unspecified open wound of abdominal wall, epigastric region without penetration into peritoneal cavity, initial encounter F17.228 - Nicotine dependence, chewing tobacco, with other nicotine-induced disorders Plan Wound Cleansing: Wound #1 Right Abdomen - midline: Clean wound with Normal Saline. May Shower, gently pat wound dry prior to applying new dressing. Wound #2 Left Abdomen - midline: Clean wound with Normal Saline. May Shower, gently pat wound dry prior to applying new dressing. Anesthetic: Wound #1 Right Abdomen - midline: Topical Lidocaine 4% cream applied to wound bed prior to debridement Wound #2 Left Abdomen - midline: [name], [name] ([id number]) Topical Lidocaine 4% cream  applied to wound bed prior to debridement Skin Barriers/Peri-Wound Care: Wound #1 Right Abdomen - midline: Skin Prep Wound #2 Left Abdomen - midline: Skin Prep Primary Wound Dressing: Wound #1 Right Abdomen - midline: Aquacel Ag Wound #2 Left Abdomen - midline: Aquacel Ag Secondary Dressing: Wound #1 Right Abdomen - midline: Boardered Foam Dressing Wound #2 Left Abdomen - midline: Boardered Foam Dressing Dressing Change Frequency: Wound #1 Right Abdomen - midline: Change dressing every other day. Wound #2 Left Abdomen - midline: Change dressing every other day. Follow-up Appointments: Wound #1 Right Abdomen - midline: Return Appointment in 1 week. Wound #2 Left Abdomen - midline: Return Appointment in 1 week. I have recommended: 1. Silver alginate and a bordered foam to be applied daily after washing with soap and water. 2. I would highly recommend a surgical opinion at [facility] for further workup including CT scan and possible revision surgery for this large incisional hernia. 3. Good control of  his diabetes mellitus 4. Adequate protein, vitamin A, vitamin C and zinc. He has had all questions answered and we will await his PCP Dr. Shirlee More call, and will be available to talk to him to further discuss the patient's care Electronic Signature(s) Signed: 07/10/2016 11:13:47 AM By: Christin Fudge MD, FACS Previous Signature: 07/10/2016 11:13:40 AM Version By: Christin Fudge MD, FACS [name], [name] ([id number]) Entered By: Christin Fudge on 07/10/2016 11:13:47 [name] ([id number]) -------------------------------------------------------------------------------- SuperBill Details Patient Name: [name] Date of Service: 07/10/2016 Medical Record Number: [id number] Patient Account Number: [id number] Date of Birth/Sex: 01-20-1946 (71 y.o. Male) Treating RN: Montey Hora Primary Care Provider: Thomes Lolling Other Clinician: Referring Provider: Thomes Lolling Treating Provider/Extender: Frann Rider in Treatment: 1 Diagnosis Coding ICD-10 Codes Code Description [phone] Type 2 diabetes mellitus with other skin ulcer Unspecified open wound of abdominal wall, right upper quadrant without penetration into S31.100A peritoneal cavity, initial encounter Unspecified open wound of abdominal wall, epigastric region without penetration into S31.102A peritoneal cavity, initial encounter F17.228 Nicotine dependence, chewing tobacco, with other nicotine-induced disorders Facility Procedures CPT4 Code: [id number] Description: 99213 - WOUND CARE VISIT-LEV 3 EST PT Modifier: Quantity: 1 Physician Procedures CPT4: Description Modifier Quantity Code [id number] 99213 - WC PHYS LEVEL 3 - EST PT 1 ICD-10 Description Diagnosis E11.622 Type 2 diabetes mellitus with other skin ulcer S31.100A Unspecified open wound of abdominal wall, right upper quadrant without  penetration into peritoneal cavity, initial encounter S31.102A Unspecified open wound of abdominal wall, epigastric region without penetration into peritoneal cavity, initial encounter Electronic Signature(s) Signed: 07/10/2016 11:13:59 AM By: Christin Fudge MD, FACS Entered By: Christin Fudge on 07/10/2016 11:13:59

## 2016-07-17 ENCOUNTER — Encounter: Payer: Medicare Other | Attending: Surgery | Admitting: Surgery

## 2016-07-17 DIAGNOSIS — Z79899 Other long term (current) drug therapy: Secondary | ICD-10-CM | POA: Diagnosis not present

## 2016-07-17 DIAGNOSIS — K219 Gastro-esophageal reflux disease without esophagitis: Secondary | ICD-10-CM | POA: Diagnosis not present

## 2016-07-17 DIAGNOSIS — X58XXXA Exposure to other specified factors, initial encounter: Secondary | ICD-10-CM | POA: Insufficient documentation

## 2016-07-17 DIAGNOSIS — S31100A Unspecified open wound of abdominal wall, right upper quadrant without penetration into peritoneal cavity, initial encounter: Secondary | ICD-10-CM | POA: Diagnosis not present

## 2016-07-17 DIAGNOSIS — I10 Essential (primary) hypertension: Secondary | ICD-10-CM | POA: Insufficient documentation

## 2016-07-17 DIAGNOSIS — E1142 Type 2 diabetes mellitus with diabetic polyneuropathy: Secondary | ICD-10-CM | POA: Insufficient documentation

## 2016-07-17 DIAGNOSIS — G8929 Other chronic pain: Secondary | ICD-10-CM | POA: Diagnosis not present

## 2016-07-17 DIAGNOSIS — J449 Chronic obstructive pulmonary disease, unspecified: Secondary | ICD-10-CM | POA: Insufficient documentation

## 2016-07-17 DIAGNOSIS — M549 Dorsalgia, unspecified: Secondary | ICD-10-CM | POA: Insufficient documentation

## 2016-07-17 DIAGNOSIS — M1990 Unspecified osteoarthritis, unspecified site: Secondary | ICD-10-CM | POA: Diagnosis not present

## 2016-07-17 DIAGNOSIS — S31102A Unspecified open wound of abdominal wall, epigastric region without penetration into peritoneal cavity, initial encounter: Secondary | ICD-10-CM | POA: Diagnosis not present

## 2016-07-17 DIAGNOSIS — Z79891 Long term (current) use of opiate analgesic: Secondary | ICD-10-CM | POA: Insufficient documentation

## 2016-07-17 DIAGNOSIS — Z7984 Long term (current) use of oral hypoglycemic drugs: Secondary | ICD-10-CM | POA: Diagnosis not present

## 2016-07-17 DIAGNOSIS — E11622 Type 2 diabetes mellitus with other skin ulcer: Secondary | ICD-10-CM | POA: Insufficient documentation

## 2016-07-17 DIAGNOSIS — F17228 Nicotine dependence, chewing tobacco, with other nicotine-induced disorders: Secondary | ICD-10-CM | POA: Diagnosis not present

## 2016-07-17 DIAGNOSIS — E538 Deficiency of other specified B group vitamins: Secondary | ICD-10-CM | POA: Insufficient documentation

## 2016-07-17 NOTE — Progress Notes (Signed)
FREDRIC, SLABACH ([id number]) Visit Report for 07/17/2016 Arrival Information Details Patient Name: [name], [name]. Date of Service: 07/17/2016 10:15 AM Medical Record Number: [id number] Patient Account Number: [id number] Date of Birth/Sex: 1945/04/23 (71 y.o. Male) Treating RN: Montey Hora Primary Care Kazuto Sevey: Thomes Lolling Other Clinician: Referring Kieanna Rollo: Thomes Lolling Treating Shamir Tuzzolino/Extender: Frann Rider in Treatment: 2 Visit Information History Since Last Visit Added or deleted any medications: No Patient Arrived: Ambulatory Any new allergies or adverse reactions: No Arrival Time: 10:23 Had a fall or experienced change in No Accompanied By: self activities of daily living that may affect Transfer Assistance: None risk of falls: Patient Identification Verified: Yes Signs or symptoms of abuse/neglect since last No Secondary Verification Process Yes visito Completed: Hospitalized since last visit: No Patient Has Alerts: Yes Has Dressing in Place as Prescribed: Yes Patient Alerts: DMII Pain Present Now: No Electronic Signature(s) Signed: 07/17/2016 2:23:12 PM By: Montey Hora Entered By: Montey Hora on 07/17/2016 10:23:57 [name] ([id number]) -------------------------------------------------------------------------------- Encounter Discharge Information Details Patient Name: [name] Date of Service: 07/17/2016 10:15 AM Medical Record Number: [id number] Patient Account Number: [id number] Date of Birth/Sex: 01/26/1946 (71 y.o. Male) Treating RN: Montey Hora Primary Care Helane Briceno: Thomes Lolling Other Clinician: Referring Denitra Donaghey: Thomes Lolling Treating Dnyla Antonetti/Extender: Frann Rider in Treatment: 2 Encounter Discharge Information Items Discharge Pain Level: 0 Discharge Condition: Stable Ambulatory Status: Ambulatory Discharge Destination: Home Transportation: Private Auto Accompanied By: self Schedule Follow-up Appointment:  Yes Medication Reconciliation completed and provided to Patient/Care No Enrigue Hashimi: Provided on Clinical Summary of Care: 07/17/2016 Form Type Recipient Paper Patient FB Electronic Signature(s) Signed: 07/17/2016 10:45:51 AM By: [name] Entered By: [name] on 07/17/2016 10:45:50 [name] ([id number]) -------------------------------------------------------------------------------- Multi Wound Chart Details Patient Name: [name] Date of Service: 07/17/2016 10:15 AM Medical Record Number: [id number] Patient Account Number: [id number] Date of Birth/Sex: Mar 05, 1946 (71 y.o. Male) Treating RN: Montey Hora Primary Care Manus Weedman: Thomes Lolling Other Clinician: Referring Jazper Nikolai: Thomes Lolling Treating Manveer Gomes/Extender: Frann Rider in Treatment: 2 Vital Signs Height(in): 65 Capillary Blood 145 Glucose(mg/dl): Weight(lbs): 161 Pulse(bpm): 66 Body Mass Index(BMI): 27 Blood Pressure Temperature(F): 97.7 168/81 (mmHg): Respiratory Rate 18 (breaths/min): Photos: [N/A:N/A] Wound Location: Right Abdomen - midline Left Abdomen - midline N/A Wounding Event: Gradually Appeared Gradually Appeared N/A Primary Etiology: Atypical Atypical N/A Comorbid History: Cataracts, Chronic Cataracts, Chronic N/A Obstructive Pulmonary Obstructive Pulmonary Disease (COPD), Disease (COPD), Hypertension, Type II Hypertension, Type II Diabetes, Osteoarthritis, Diabetes, Osteoarthritis, Neuropathy Neuropathy Date Acquired: 05/17/2015 05/17/2015 N/A Weeks of Treatment: 2 2 N/A Wound Status: Open Open N/A Measurements L x W x D 1.5x0.5x0.1 4.4x0.9x0.1 N/A (cm) Area (cm) : 0.589 3.11 N/A Volume (cm) : 0.059 0.311 N/A % Reduction in Area: 82.80% 41.10% N/A % Reduction in Volume: 82.80% 41.10% N/A Classification: Full Thickness Without Full Thickness Without N/A Exposed Support Exposed Support Structures Structures Exudate Amount: Medium Medium N/A Exudate Type: Serous  Serous N/A NICKLOS, GAXIOLA ([id number]) Exudate Color: amber amber N/A Wound Margin: Flat and Intact Flat and Intact N/A Granulation Amount: Large (67-100%) Large (67-100%) N/A Granulation Quality: Red Red N/A Necrotic Amount: Small (1-33%) Small (1-33%) N/A Necrotic Tissue: Eschar Eschar N/A Exposed Structures: Fascia: No Fascia: No N/A Fat Layer (Subcutaneous Fat Layer (Subcutaneous Tissue) Exposed: No Tissue) Exposed: No Tendon: No Tendon: No Muscle: No Muscle: No Joint: No Joint: No Bone: No Bone: No Limited to Skin Limited to Skin Breakdown Breakdown Epithelialization: None None N/A Debridement: N/A Debridement ([id number]) Pre-procedure N/A  10:36 N/A Verification/Time Out Taken: Pain Control: N/A Lidocaine 4% Topical N/A Solution Tissue Debrided: N/A Other, Subcutaneous N/A Level: N/A Skin/Subcutaneous N/A Tissue Debridement Area (sq N/A 3.96 N/A cm): Instrument: N/A Forceps, Scissors N/A Bleeding: N/A Minimum N/A Hemostasis Achieved: N/A Pressure N/A Procedural Pain: N/A 0 N/A Post Procedural Pain: N/A 0 N/A Debridement Treatment N/A Procedure was tolerated N/A Response: well Post Debridement N/A 4.4x0.9x0.2 N/A Measurements L x W x D (cm) Post Debridement N/A 0.622 N/A Volume: (cm) Periwound Skin Texture: Excoriation: No Excoriation: No N/A Induration: No Induration: No Callus: No Callus: No Crepitus: No Crepitus: No Rash: No Rash: No Scarring: No Scarring: No Periwound Skin Maceration: No Maceration: No N/A Moisture: Dry/Scaly: No Dry/Scaly: No Periwound Skin Color: Atrophie Blanche: No Atrophie Blanche: No N/A Cyanosis: No Cyanosis: No ARVIE, BARTHOLOMEW ([id number]) Ecchymosis: No Ecchymosis: No Erythema: No Erythema: No Hemosiderin Staining: No Hemosiderin Staining: No Mottled: No Mottled: No Pallor: No Pallor: No Rubor: No Rubor: No Temperature: No Abnormality No Abnormality N/A Tenderness on No No  N/A Palpation: Wound Preparation: Ulcer Cleansing: Ulcer Cleansing: N/A Rinsed/Irrigated with Rinsed/Irrigated with Saline Saline Topical Anesthetic Topical Anesthetic Applied: Other: lidocaine Applied: Other: lidocaine 4% 4% Assessment Notes: mesh showing through mesh showing through N/A wound bed wound bed Procedures Performed: N/A Debridement N/A Treatment Notes Wound #1 (Right Abdomen - midline) 1. Cleansed with: Clean wound with Normal Saline 2. Anesthetic Topical Lidocaine 4% cream to wound bed prior to debridement 4. Dressing Applied: Aquacel Ag 5. Secondary [place] #2 (Left Abdomen - midline) 1. Cleansed with: Clean wound with Normal Saline 2. Anesthetic Topical Lidocaine 4% cream to wound bed prior to debridement 4. Dressing Applied: Aquacel Ag 5. Secondary [place] Signature(s) Signed: 07/17/2016 10:54:33 AM By: Christin Fudge MD, FACS Entered By: Christin Fudge on 07/17/2016 10:54:33 [name] ([id number]) -------------------------------------------------------------------------------- [place] Details Patient Name: RANULFO, KALL Date of Service: 07/17/2016 10:15 AM Medical Record Number: [id number] Patient Account Number: [id number] Date of Birth/Sex: [birth year] (71 y.o. Male) Treating RN: Montey Hora Primary Care Dajah Fischman: Thomes Lolling Other Clinician: Referring Lucien Budney: Thomes Lolling Treating Graves Nipp/Extender: Frann Rider in Treatment: 2 Active Inactive ` Abuse / Safety / Falls / Self Care Management Nursing Diagnoses: Impaired physical mobility Goals: Patient will remain injury free Date Initiated: 07/03/2016 Target Resolution Date: 09/14/2016 Goal Status: Active Interventions: Assess fall risk on admission and as needed Notes: ` Orientation to the Wound Care Program Nursing Diagnoses: Knowledge deficit related to the wound healing center  program Goals: Patient/caregiver will verbalize understanding of the [place] Date Initiated: 07/03/2016 Target Resolution Date: 09/15/2016 Goal Status: Active Interventions: Provide education on orientation to the wound center Notes: ` Wound/Skin Impairment Nursing Diagnoses: Impaired tissue integrity CLIFFARD, HAIR ([id number]) Goals: Patient/caregiver will verbalize understanding of skin care regimen Date Initiated: 07/03/2016 Target Resolution Date: 09/15/2016 Goal Status: Active Ulcer/skin breakdown will have a volume reduction of 30% by week 4 Date Initiated: 07/03/2016 Target Resolution Date: 09/15/2016 Goal Status: Active Ulcer/skin breakdown will have a volume reduction of 50% by week 8 Date Initiated: 07/03/2016 Target Resolution Date: 09/15/2016 Goal Status: Active Ulcer/skin breakdown will have a volume reduction of 80% by week 12 Date Initiated: 07/03/2016 Target Resolution Date: 09/15/2016 Goal Status: Active Ulcer/skin breakdown will heal within 14 weeks Date Initiated: 07/03/2016 Target Resolution Date: 09/15/2016 Goal Status: Active Interventions: Assess patient/caregiver ability to obtain necessary supplies Assess patient/caregiver ability to perform ulcer/skin care regimen upon  admission and as needed Assess ulceration(s) every visit Notes: Electronic Signature(s) Signed: 07/17/2016 2:23:12 PM By: Montey Hora Entered By: Montey Hora on 07/17/2016 10:33:13 [name] ([id number]) -------------------------------------------------------------------------------- Pain Assessment Details Patient Name: [name] Date of Service: 07/17/2016 10:15 AM Medical Record Number: [id number] Patient Account Number: [id number] Date of Birth/Sex: [birth year] (70 y.o. Male) Treating RN: Montey Hora Primary Care Peg Fifer: Thomes Lolling Other Clinician: Referring Nickey Kloepfer: Thomes Lolling Treating Kessa Fairbairn/Extender: Frann Rider in Treatment:  2 Active Problems Location of Pain Severity and Description of Pain Patient Has Paino No Site Locations Pain Management and Medication Current Pain Management: Notes Topical or injectable lidocaine is offered to patient for acute pain when surgical debridement is performed. If needed, Patient is instructed to use over the counter pain medication for the following 24-48 hours after debridement. Wound care MDs do not prescribed pain medications. Patient has chronic pain or uncontrolled pain. Patient has been instructed to make an appointment with their Primary Care Physician for pain management. Electronic Signature(s) Signed: 07/17/2016 2:23:12 PM By: Montey Hora Entered By: Montey Hora on 07/17/2016 10:24:05 [name] ([id number]) -------------------------------------------------------------------------------- Patient/Caregiver Education Details Patient Name: JULIA, KULZER Date of Service: 07/17/2016 10:15 AM Medical Record Number: [id number] Patient Account Number: [id number] Date of Birth/Gender: 1945/08/20 (71 y.o. Male) Treating RN: Montey Hora Primary Care Physician: Thomes Lolling Other Clinician: Referring Physician: Thomes Lolling Treating Physician/Extender: Frann Rider in Treatment: 2 Education Assessment Education Provided To: Patient Education Topics Provided Wound/Skin Impairment: Handouts: Other: wound care as ordered Methods: Demonstration, Explain/Verbal Responses: State content correctly Electronic Signature(s) Signed: 07/17/2016 2:23:12 PM By: Montey Hora Entered By: Montey Hora on 07/17/2016 10:35:14 [name] ([id number]) -------------------------------------------------------------------------------- Wound Assessment Details Patient Name: [name] Date of Service: 07/17/2016 10:15 AM Medical Record Number: [id number] Patient Account Number: [id number] Date of Birth/Sex: [birth year] (71 y.o. Male) Treating RN: Montey Hora Primary Care Salmaan Patchin: Thomes Lolling Other Clinician: Referring Henrique Parekh: Thomes Lolling Treating Gloris Shiroma/Extender: Frann Rider in Treatment: 2 Wound Status Wound Number: 1 Primary Atypical Etiology: Wound Location: Right Abdomen - midline Wound Open Wounding Event: Gradually Appeared Status: Date Acquired: 05/17/2015 Comorbid Cataracts, Chronic Obstructive Weeks Of Treatment: 2 History: Pulmonary Disease (COPD), Clustered Wound: No Hypertension, Type II Diabetes, Osteoarthritis, Neuropathy Photos Photo Uploaded By: Montey Hora on 07/17/2016 10:51:00 Wound Measurements Length: (cm) 1.5 Width: (cm) 0.5 Depth: (cm) 0.1 Area: (cm) 0.589 Volume: (cm) 0.059 % Reduction in Area: 82.8% % Reduction in Volume: 82.8% Epithelialization: None Tunneling: No Undermining: No Wound Description Full Thickness Without Exposed Classification: Support Structures Wound Margin: Flat and Intact Exudate Medium Amount: Exudate Type: Serous Exudate Color: amber Foul Odor After Cleansing: No Slough/Fibrino No Wound Bed Granulation Amount: Large (67-100%) Exposed Structure KALANI, STHILAIRE ([id number]) Granulation Quality: Red Fascia Exposed: No Necrotic Amount: Small (1-33%) Fat Layer (Subcutaneous Tissue) Exposed: No Necrotic Quality: Eschar Tendon Exposed: No Muscle Exposed: No Joint Exposed: No Bone Exposed: No Limited to Skin Breakdown Periwound Skin Texture Texture Color No Abnormalities Noted: No No Abnormalities Noted: No Callus: No Atrophie Blanche: No Crepitus: No Cyanosis: No Excoriation: No Ecchymosis: No Induration: No Erythema: No Rash: No Hemosiderin Staining: No Scarring: No Mottled: No Pallor: No Moisture Rubor: No No Abnormalities Noted: No Dry / Scaly: No Temperature / Pain Maceration: No Temperature: No Abnormality Wound Preparation Ulcer Cleansing: Rinsed/Irrigated with Saline Topical Anesthetic Applied: Other: lidocaine  4%, Assessment Notes mesh showing through wound bed Treatment Notes Wound #1 (Right Abdomen - midline) 1. Cleansed with:  Clean wound with Normal Saline 2. Anesthetic Topical Lidocaine 4% cream to wound bed prior to debridement 4. Dressing Applied: Aquacel Ag 5. Secondary [place] Signature(s) Signed: 07/17/2016 2:23:12 PM By: Montey Hora Entered By: Montey Hora on 07/17/2016 10:31:24 [name] ([id number]) -------------------------------------------------------------------------------- Wound Assessment Details Patient Name: ASHUR, GLATFELTER Date of Service: 07/17/2016 10:15 AM Medical Record Number: [id number] Patient Account Number: [id number] Date of Birth/Sex: 12/29/1945 (71 y.o. Male) Treating RN: Montey Hora Primary Care Burnie Hank: Thomes Lolling Other Clinician: Referring Shauntavia Brackin: Thomes Lolling Treating Elicia Lui/Extender: Frann Rider in Treatment: 2 Wound Status Wound Number: 2 Primary Atypical Etiology: Wound Location: Left Abdomen - midline Wound Open Wounding Event: Gradually Appeared Status: Date Acquired: 05/17/2015 Comorbid Cataracts, Chronic Obstructive Weeks Of Treatment: 2 History: Pulmonary Disease (COPD), Clustered Wound: No Hypertension, Type II Diabetes, Osteoarthritis, Neuropathy Photos Photo Uploaded By: Montey Hora on 07/17/2016 10:51:01 Wound Measurements Length: (cm) 4.4 Width: (cm) 0.9 Depth: (cm) 0.1 Area: (cm) 3.11 Volume: (cm) 0.311 % Reduction in Area: 41.1% % Reduction in Volume: 41.1% Epithelialization: None Tunneling: No Undermining: No Wound Description Full Thickness Without Exposed Classification: Support Structures Wound Margin: Flat and Intact Exudate Medium Amount: Exudate Type: Serous Exudate Color: amber Foul Odor After Cleansing: No Slough/Fibrino No Wound Bed Granulation Amount: Large (67-100%) Exposed Structure AARYA, QUEBEDEAUX ([id number]) Granulation  Quality: Red Fascia Exposed: No Necrotic Amount: Small (1-33%) Fat Layer (Subcutaneous Tissue) Exposed: No Necrotic Quality: Eschar Tendon Exposed: No Muscle Exposed: No Joint Exposed: No Bone Exposed: No Limited to Skin Breakdown Periwound Skin Texture Texture Color No Abnormalities Noted: No No Abnormalities Noted: No Callus: No Atrophie Blanche: No Crepitus: No Cyanosis: No Excoriation: No Ecchymosis: No Induration: No Erythema: No Rash: No Hemosiderin Staining: No Scarring: No Mottled: No Pallor: No Moisture Rubor: No No Abnormalities Noted: No Dry / Scaly: No Temperature / Pain Maceration: No Temperature: No Abnormality Wound Preparation Ulcer Cleansing: Rinsed/Irrigated with Saline Topical Anesthetic Applied: Other: lidocaine 4%, Assessment Notes mesh showing through wound bed Treatment Notes Wound #2 (Left Abdomen - midline) 1. Cleansed with: Clean wound with Normal Saline 2. Anesthetic Topical Lidocaine 4% cream to wound bed prior to debridement 4. Dressing Applied: Aquacel Ag 5. Secondary [place] Signature(s) Signed: 07/17/2016 2:23:12 PM By: Montey Hora Entered By: Montey Hora on 07/17/2016 10:31:07 [name] ([id number]) -------------------------------------------------------------------------------- [place] Details Patient Name: CHADEN, DOOM Date of Service: 07/17/2016 10:15 AM Medical Record Number: [id number] Patient Account Number: [id number] Date of Birth/Sex: [birth year] (71 y.o. Male) Treating RN: Montey Hora Primary Care Abigaelle Verley: Thomes Lolling Other Clinician: Referring Nashley [place]: Thomes Lolling Treating Montrel Donahoe/Extender: Frann Rider in Treatment: 2 Vital Signs Time Taken: 10:24 Temperature (F): 97.7 Height (in): 65 Pulse (bpm): 81 Weight (lbs): 161 Respiratory Rate (breaths/min): 18 Body Mass Index (BMI): 26.8 Blood Pressure (mmHg): 168/81 Capillary Blood Glucose (mg/dl):  145 Reference Range: 80 - 120 mg / dl Electronic Signature(s) Signed: 07/17/2016 2:23:12 PM By: Montey Hora Entered By: Montey Hora on 07/17/2016 10:25:46

## 2016-07-18 NOTE — Progress Notes (Signed)
[place], RAVENSCROFT ([id number]) Visit Report for 07/17/2016 Chief Complaint Document Details Patient Name: [name], [name]. Date of Service: 07/17/2016 10:15 AM Medical Record Number: [id number] Patient Account Number: [id number] Date of Birth/Sex: 12/05/1945 (71 y.o. Male) Treating RN: Montey Hora Primary Care Provider: Thomes Lolling Other Clinician: Referring Provider: Thomes Lolling Treating Provider/Extender: Frann Rider in Treatment: 2 Information Obtained from: Patient Chief Complaint Patients presents for treatment of an open diabetic ulcer to the anterior abdominal wall in the epigastric and right upper quadrant which she's had for over 5 years Electronic Signature(s) Signed: 07/17/2016 10:55:28 AM By: Christin Fudge MD, FACS Entered By: Christin Fudge on 07/17/2016 10:55:28 [name] ([id number]) -------------------------------------------------------------------------------- Debridement Details Patient Name: [name] Date of Service: 07/17/2016 10:15 AM Medical Record Number: [id number] Patient Account Number: [id number] Date of Birth/Sex: 05/19/1945 (71 y.o. Male) Treating RN: Montey Hora Primary Care Provider: Thomes Lolling Other Clinician: Referring Provider: Thomes Lolling Treating Provider/Extender: Frann Rider in Treatment: 2 Debridement Performed for Wound #2 Left Abdomen - midline Assessment: Performed By: Physician Christin Fudge, MD Debridement: Debridement Pre-procedure Yes - 10:36 Verification/Time Out Taken: Start Time: 10:36 Pain Control: Lidocaine 4% Topical Solution Level: Skin/Subcutaneous Tissue Total Area Debrided (L x 4.4 (cm) x 0.9 (cm) = 3.96 (cm) W): Tissue and other Viable, Non-Viable, Other, Subcutaneous material debrided: Instrument: Forceps, Scissors Bleeding: Minimum Hemostasis Achieved: Pressure End Time: 10:38 Procedural Pain: 0 Post Procedural Pain: 0 Response to Treatment: Procedure was tolerated well Post  Debridement Measurements of Total Wound Length: (cm) 4.4 Width: (cm) 0.9 Depth: (cm) 0.2 Volume: (cm) 0.622 Character of Wound/Ulcer Post Improved Debridement: Severity of Tissue Post Debridement: Fat layer exposed Post Procedure Diagnosis Same as Pre-procedure Notes some protruding mesh which looks like Marlex mesh was excised sharply with a forcep and scissors. On the right side a Prolene suture was sharply removed from the superior part of the wound Electronic Signature(s) Signed: 07/17/2016 10:55:19 AM By: Christin Fudge MD, FACS [name], [name] ([id number]) Signed: 07/17/2016 2:23:12 PM By: Montey Hora Entered By: Christin Fudge on 07/17/2016 10:55:18 [name] ([id number]) -------------------------------------------------------------------------------- HPI Details Patient Name: [name], [name] Date of Service: 07/17/2016 10:15 AM Medical Record Number: [id number] Patient Account Number: [id number] Date of Birth/Sex: 1945-08-05 (71 y.o. Male) Treating RN: Montey Hora Primary Care Provider: Thomes Lolling Other Clinician: Referring Provider: Thomes Lolling Treating Provider/Extender: Frann Rider in Treatment: 2 History of Present Illness Location: abdomen wall wounds,-- right upper quadrant and epigastric region Quality: Patient reports experiencing a dull pain to affected area(s). Severity: Patient states wound are getting worse. Duration: Patient has had the wound for >5 year's prior to seeking treatment at the wound center Timing: Pain in wound is Intermittent (comes and goes Context: The wound appeared gradually over time Modifying Factors: Consults to this date include:not seen a surgeon and only sees his PCP for his medical complaints Associated Signs and Symptoms: Patient reports having increase discharge. HPI Description: 71 year old gentleman has been seen recently by his PCP Dr. Thomes Lolling, who sees him with a history of diabetes mellitus,  peripheral neuropathy, B12 deficiency, hypertension, osteoarthritis and the patient had recent blood sugar checked which was 163. Past medical history includes essential hypertension, incisional hernia, diabetes mellitus, colon polyps, COPD, tobacco abuse in the past, chewing tobacco, GERD. He has also been treated for chronic back pain with no sciatica and is on oxycodone. His last hemoglobin A1c was 6.9% His abdominal surgery was done at [facility] over 15 years ago  and gradually there was sutures which protruded and then there was mesh which protruded. He washes this during his shower but does not put any dressing and the wound is covered with a lot of debris. He has never had a surgical consultation for this. 07/10/2016 -- the patient has spoken to his PCP who said he was going to call me but I have not yet had a phone conversation with the physician. From what I understand the physician was reluctant to have a surgical opinion because of the patient's several comorbidities and he feels that the surgery may be detrimental to his overall health Electronic Signature(s) Signed: 07/17/2016 10:55:33 AM By: Christin Fudge MD, FACS Entered By: Christin Fudge on 07/17/2016 10:55:33 [name] ([id number]) -------------------------------------------------------------------------------- Physical Exam Details Patient Name: [name] Date of Service: 07/17/2016 10:15 AM Medical Record Number: [id number] Patient Account Number: [id number] Date of Birth/Sex: [birth year] (71 y.o. Male) Treating RN: Montey Hora Primary Care Provider: Thomes Lolling Other Clinician: Referring Provider: Thomes Lolling Treating Provider/Extender: Frann Rider in Treatment: 2 Constitutional . Pulse regular. Respirations normal and unlabored. Afebrile. . Eyes Nonicteric. Reactive to light. Ears, Nose, Mouth, and Throat Lips, teeth, and gums WNL.[name] Moist mucosa without lesions. Neck supple and nontender.  No palpable supraclavicular or cervical adenopathy. Normal sized without goiter. Respiratory WNL. No retractions.. Cardiovascular Pedal Pulses WNL. No clubbing, cyanosis or edema. Lymphatic No adneopathy. No adenopathy. No adenopathy. Musculoskeletal Adexa without tenderness or enlargement.. Digits and nails w/o clubbing, cyanosis, infection, petechiae, ischemia, or inflammatory conditions.. Integumentary (Hair, Skin) No suspicious lesions. No crepitus or fluctuance. No peri-wound warmth or erythema. No masses.[name] Psychiatric Judgement and insight Intact.. No evidence of depression, anxiety, or agitation.. Notes the right superior wound had a piece of Prolene sticking out and I have sharply removed this. The left abdomen wound has some mesh especially at the inferior and which I have sharply removed. There is no evidence of surrounding cellulitis. Electronic Signature(s) Signed: 07/17/2016 10:56:35 AM By: Christin Fudge MD, FACS Entered By: Christin Fudge on 07/17/2016 10:56:34 [name] ([id number]) -------------------------------------------------------------------------------- Physician Orders Details Patient Name: [name], [name] Date of Service: 07/17/2016 10:15 AM Medical Record Number: [id number] Patient Account Number: [id number] Date of Birth/Sex: 07-Jul-1945 (71 y.o. Male) Treating RN: Montey Hora Primary Care Provider: Thomes Lolling Other Clinician: Referring Provider: Thomes Lolling Treating Provider/Extender: Frann Rider in Treatment: 2 Verbal / Phone Orders: No Diagnosis Coding Wound Cleansing Wound #1 Right Abdomen - midline o Clean wound with Normal Saline. o May Shower, gently pat wound dry prior to applying new dressing. Wound #2 Left Abdomen - midline o Clean wound with Normal Saline. o May Shower, gently pat wound dry prior to applying new dressing. Anesthetic Wound #1 Right Abdomen - midline o Topical Lidocaine 4% cream applied to wound  bed prior to debridement Wound #2 Left Abdomen - midline o Topical Lidocaine 4% cream applied to wound bed prior to debridement Skin Barriers/Peri-Wound Care Wound #1 Right Abdomen - midline o Skin Prep Wound #2 Left Abdomen - midline o Skin Prep Primary Wound Dressing Wound #1 Right Abdomen - midline o Aquacel Ag Wound #2 Left Abdomen - midline o Aquacel Ag Secondary Dressing Wound #1 Right Abdomen - midline o Boardered Foam Dressing Wound #2 Left Abdomen - midline o Boardered Foam Dressing [name], [name] ([id number]) Dressing Change Frequency Wound #1 Right Abdomen - midline o Change dressing every other day. Wound #2 Left Abdomen - midline o Change dressing every other day. Follow-up  Appointments Wound #1 Right Abdomen - midline o Return Appointment in 1 week. Wound #2 Left Abdomen - midline o Return Appointment in 1 week. Electronic Signature(s) Signed: 07/17/2016 2:23:12 PM By: Montey Hora Signed: 07/17/2016 4:28:20 PM By: Christin Fudge MD, FACS Entered By: Montey Hora on 07/17/2016 10:38:24 [name] ([id number]) -------------------------------------------------------------------------------- Problem List Details Patient Name: [name], [name] Date of Service: 07/17/2016 10:15 AM Medical Record Number: [id number] Patient Account Number: [id number] Date of Birth/Sex: [birth year] (71 y.o. Male) Treating RN: Montey Hora Primary Care Provider: Thomes Lolling Other Clinician: Referring Provider: Thomes Lolling Treating Provider/Extender: Frann Rider in Treatment: 2 Active Problems ICD-10 Encounter Code Description Active Date Diagnosis E11.622 Type 2 diabetes mellitus with other skin ulcer 07/03/2016 Yes S31.100A Unspecified open wound of abdominal wall, right upper 07/03/2016 Yes quadrant without penetration into peritoneal cavity, initial encounter S31.102A Unspecified open wound of abdominal wall, epigastric 07/03/2016  Yes region without penetration into peritoneal cavity, initial encounter F17.228 Nicotine dependence, chewing tobacco, with other 07/03/2016 Yes nicotine-induced disorders Inactive Problems Resolved Problems Electronic Signature(s) Signed: 07/17/2016 10:54:27 AM By: Christin Fudge MD, FACS Entered By: Christin Fudge on 07/17/2016 10:54:26 [name] ([id number]) -------------------------------------------------------------------------------- Progress Note Details Patient Name: [name] Date of Service: 07/17/2016 10:15 AM Medical Record Number: [id number] Patient Account Number: [id number] Date of Birth/Sex: [birth year] (71 y.o. Male) Treating RN: Montey Hora Primary Care Provider: Thomes Lolling Other Clinician: Referring Provider: Thomes Lolling Treating Provider/Extender: Frann Rider in Treatment: 2 Subjective Chief Complaint Information obtained from Patient Patients presents for treatment of an open diabetic ulcer to the anterior abdominal wall in the epigastric and right upper quadrant which she's had for over 5 years History of Present Illness (HPI) The following HPI elements were documented for the patient's wound: Location: abdomen wall wounds,-- right upper quadrant and epigastric region Quality: Patient reports experiencing a dull pain to affected area(s). Severity: Patient states wound are getting worse. Duration: Patient has had the wound for >5 year's prior to seeking treatment at the wound center Timing: Pain in wound is Intermittent (comes and goes Context: The wound appeared gradually over time Modifying Factors: Consults to this date include:not seen a surgeon and only sees his PCP for his medical complaints Associated Signs and Symptoms: Patient reports having increase discharge. 71 year old gentleman has been seen recently by his PCP Dr. Thomes Lolling, who sees him with a history of diabetes mellitus, peripheral neuropathy, B12 deficiency,  hypertension, osteoarthritis and the patient had recent blood sugar checked which was 163. Past medical history includes essential hypertension, incisional hernia, diabetes mellitus, colon polyps, COPD, tobacco abuse in the past, chewing tobacco, GERD. He has also been treated for chronic back pain with no sciatica and is on oxycodone. His last hemoglobin A1c was 6.9% His abdominal surgery was done at [facility] over 15 years ago and gradually there was sutures which protruded and then there was mesh which protruded. He washes this during his shower but does not put any dressing and the wound is covered with a lot of debris. He has never had a surgical consultation for this. 07/10/2016 -- the patient has spoken to his PCP who said he was going to call me but I have not yet had a phone conversation with the physician. From what I understand the physician was reluctant to have a surgical opinion because of the patient's several comorbidities and he feels that the surgery may be detrimental to his overall health [name], [name]. ([id number]) Objective Constitutional Pulse  regular. Respirations normal and unlabored. Afebrile. Vitals Time Taken: 10:24 AM, Height: 65 in, Weight: 161 lbs, BMI: 26.8, Temperature: 97.7 F, Pulse: 81 bpm, Respiratory Rate: 18 breaths/min, Blood Pressure: 168/81 mmHg, Capillary Blood Glucose: 145 mg/dl. Eyes Nonicteric. Reactive to light. Ears, Nose, Mouth, and Throat Lips, teeth, and gums WNL.[name] Moist mucosa without lesions. Neck supple and nontender. No palpable supraclavicular or cervical adenopathy. Normal sized without goiter. Respiratory WNL. No retractions.. Cardiovascular Pedal Pulses WNL. No clubbing, cyanosis or edema. Lymphatic No adneopathy. No adenopathy. No adenopathy. Musculoskeletal Adexa without tenderness or enlargement.. Digits and nails w/o clubbing, cyanosis, infection, petechiae, ischemia, or inflammatory  conditions.[name] Psychiatric Judgement and insight Intact.. No evidence of depression, anxiety, or agitation.. General Notes: the right superior wound had a piece of Prolene sticking out and I have sharply removed this. The left abdomen wound has some mesh especially at the inferior and which I have sharply removed. There is no evidence of surrounding cellulitis. Integumentary (Hair, Skin) No suspicious lesions. No crepitus or fluctuance. No peri-wound warmth or erythema. No masses.. Wound #1 status is Open. Original cause of wound was Gradually Appeared. The wound is located on the Right Abdomen - midline. The wound measures 1.5cm length x 0.5cm width x 0.1cm depth; 0.589cm^2 area and 0.059cm^3 volume. The wound is limited to skin breakdown. There is no tunneling or undermining noted. There is a medium amount of serous drainage noted. The wound margin is flat and intact. There is large (67-100%) red granulation within the wound bed. There is a small (1-33%) amount of necrotic tissue within the wound bed including Eschar. The periwound skin appearance did not exhibit: Callus, Crepitus, [name], [name]. ([id number]) Excoriation, Induration, Rash, Scarring, Dry/Scaly, Maceration, Atrophie Blanche, Cyanosis, Ecchymosis, Hemosiderin Staining, Mottled, Pallor, Rubor, Erythema. Periwound temperature was noted as No Abnormality. General Notes: mesh showing through wound bed Wound #2 status is Open. Original cause of wound was Gradually Appeared. The wound is located on the Left Abdomen - midline. The wound measures 4.4cm length x 0.9cm width x 0.1cm depth; 3.11cm^2 area and 0.311cm^3 volume. The wound is limited to skin breakdown. There is no tunneling or undermining noted. There is a medium amount of serous drainage noted. The wound margin is flat and intact. There is large (67-100%) red granulation within the wound bed. There is a small (1-33%) amount of necrotic tissue within the wound bed including  Eschar. The periwound skin appearance did not exhibit: Callus, Crepitus, Excoriation, Induration, Rash, Scarring, Dry/Scaly, Maceration, Atrophie Blanche, Cyanosis, Ecchymosis, Hemosiderin Staining, Mottled, Pallor, Rubor, Erythema. Periwound temperature was noted as No Abnormality. General Notes: mesh showing through wound bed Assessment Active Problems ICD-10 E11.622 - Type 2 diabetes mellitus with other skin ulcer S31.100A - Unspecified open wound of abdominal wall, right upper quadrant without penetration into peritoneal cavity, initial encounter S31.102A - Unspecified open wound of abdominal wall, epigastric region without penetration into peritoneal cavity, initial encounter F17.228 - Nicotine dependence, chewing tobacco, with other nicotine-induced disorders Procedures Wound #2 Wound #2 is an Atypical located on the Left Abdomen - midline . There was a Skin/Subcutaneous Tissue Debridement ([id number]) debridement with total area of 3.96 sq cm performed by Christin Fudge, MD. with the following instrument(s): Forceps and Scissors to remove Viable and Non-Viable tissue/material including Other and Subcutaneous after achieving pain control using Lidocaine 4% Topical Solution. A time out was conducted at 10:36, prior to the start of the procedure. A Minimum amount of bleeding was controlled with Pressure. The procedure was tolerated well  with a pain level of 0 throughout and a pain level of 0 following the procedure. Post Debridement Measurements: 4.4cm length x 0.9cm width x 0.2cm depth; 0.622cm^3 volume. Character of Wound/Ulcer Post Debridement is improved. Severity of Tissue Post Debridement is: Fat layer exposed. Post procedure Diagnosis Wound #2: Same as Pre-Procedure [name], [name] ([id number]) General Notes: some protruding mesh which looks like Marlex mesh was excised sharply with a forcep and scissors. On the right side a Prolene suture was sharply removed from the superior  part of the wound. Plan Wound Cleansing: Wound #1 Right Abdomen - midline: Clean wound with Normal Saline. May Shower, gently pat wound dry prior to applying new dressing. Wound #2 Left Abdomen - midline: Clean wound with Normal Saline. May Shower, gently pat wound dry prior to applying new dressing. Anesthetic: Wound #1 Right Abdomen - midline: Topical Lidocaine 4% cream applied to wound bed prior to debridement Wound #2 Left Abdomen - midline: Topical Lidocaine 4% cream applied to wound bed prior to debridement Skin Barriers/Peri-Wound Care: Wound #1 Right Abdomen - midline: Skin Prep Wound #2 Left Abdomen - midline: Skin Prep Primary Wound Dressing: Wound #1 Right Abdomen - midline: Aquacel Ag Wound #2 Left Abdomen - midline: Aquacel Ag Secondary Dressing: Wound #1 Right Abdomen - midline: Boardered Foam Dressing Wound #2 Left Abdomen - midline: Boardered Foam Dressing Dressing Change Frequency: Wound #1 Right Abdomen - midline: Change dressing every other day. Wound #2 Left Abdomen - midline: Change dressing every other day. Follow-up Appointments: Wound #1 Right Abdomen - midline: Return Appointment in 1 week. Wound #2 Left Abdomen - midline: Return Appointment in 1 week. [name], [name] ([id number]) The patient has a large incisional hernia and in some areas there is evidence of protruding mesh, from the subcutaneous tissue. Though the quality of the wound has improved much since I saw him for the first time, I still think a CT scan and a surgical opinion is going to be advisable. The patient has a daughter who helps him with his decisions and I have invited her to come over and discuss with me further, as I am not sure if the patient understands my recommendations. I have recommended: 1. Silver alginate and a bordered foam to be applied daily after washing with soap and water. 2. I would highly recommend a surgical opinion at [facility] for further workup  including CT scan and possible revision surgery for this large incisional hernia. 3. Good control of his diabetes mellitus 4. Adequate protein, vitamin A, vitamin C and zinc. Electronic Signature(s) Signed: 07/17/2016 10:59:32 AM By: Christin Fudge MD, FACS Entered By: Christin Fudge on 07/17/2016 10:59:31 [name] ([id number]) -------------------------------------------------------------------------------- SuperBill Details Patient Name: [name] Date of Service: 07/17/2016 Medical Record Number: [id number] Patient Account Number: [id number] Date of Birth/Sex: 01/16/1946 (71 y.o. Male) Treating RN: Montey Hora Primary Care Provider: Thomes Lolling Other Clinician: Referring Provider: Thomes Lolling Treating Provider/Extender: Frann Rider in Treatment: 2 Diagnosis Coding ICD-10 Codes Code Description [phone] Type 2 diabetes mellitus with other skin ulcer Unspecified open wound of abdominal wall, right upper quadrant without penetration into S31.100A peritoneal cavity, initial encounter Unspecified open wound of abdominal wall, epigastric region without penetration into S31.102A peritoneal cavity, initial encounter F17.228 Nicotine dependence, chewing tobacco, with other nicotine-induced disorders Facility Procedures CPT4: Description Modifier Quantity Code [id number] 11042 - DEB SUBQ TISSUE 20 SQ CM/< 1 ICD-10 Description Diagnosis E11.622 Type 2 diabetes mellitus with other skin ulcer S31.100A Unspecified open wound  of abdominal wall, right upper quadrant without  penetration into peritoneal cavity, initial encounter S31.102A Unspecified open wound of abdominal wall, epigastric region without penetration into peritoneal cavity, initial encounter F17.228 Nicotine dependence, chewing tobacco, with other  nicotine-induced disorders Physician Procedures CPT4: Description Modifier Quantity Code [id number] 80165 - WC PHYS SUBQ TISS 20 SQ CM 1 ICD-10 Description Diagnosis  E11.622 Type 2 diabetes mellitus with other skin ulcer S31.100A Unspecified open wound of abdominal wall, right upper quadrant without  penetration into peritoneal cavity, initial encounter S31.102A Unspecified open wound of abdominal wall, epigastric region without penetration into peritoneal cavity, initial encounter F17.228 Nicotine dependence, chewing tobacco, with other  nicotine-induced disorders ZACHARIA, SOWLES ([id number]) Electronic Signature(s) Signed: 07/17/2016 11:06:54 AM By: Christin Fudge MD, FACS Entered By: Christin Fudge on 07/17/2016 11:06:54

## 2016-07-24 ENCOUNTER — Encounter: Payer: Medicare Other | Admitting: Surgery

## 2016-07-24 DIAGNOSIS — E11622 Type 2 diabetes mellitus with other skin ulcer: Secondary | ICD-10-CM | POA: Diagnosis not present

## 2016-07-25 NOTE — Progress Notes (Signed)
[name] ([id number]) Visit Report for 07/24/2016 Chief Complaint Document Details Patient Name: [name], [name]. Date of Service: 07/24/2016 11:00 AM Medical Record Number: [id number] Patient Account Number: [id number] Date of Birth/Sex: [birth year] (71 y.o. Male) Treating RN: Ahmed Prima Primary Care Provider: Thomes Lolling Other Clinician: Referring Provider: Thomes Lolling Treating Provider/Extender: Frann Rider in Treatment: 3 Information Obtained from: Patient Chief Complaint Patients presents for treatment of an open diabetic ulcer to the anterior abdominal wall in the epigastric and right upper quadrant which she's had for over 5 years Electronic Signature(s) Signed: 07/24/2016 11:52:27 AM By: Christin Fudge MD, FACS Entered By: Christin Fudge on 07/24/2016 11:52:26 [name] ([id number]) -------------------------------------------------------------------------------- Debridement Details Patient Name: [name] Date of Service: 07/24/2016 11:00 AM Medical Record Number: [id number] Patient Account Number: [id number] Date of Birth/Sex: November 09, 1945 (71 y.o. Male) Treating RN: Ahmed Prima Primary Care Provider: Thomes Lolling Other Clinician: Referring Provider: Thomes Lolling Treating Provider/Extender: Frann Rider in Treatment: 3 Debridement Performed for Wound #2 Left Abdomen - midline Assessment: Performed By: Physician Christin Fudge, MD Debridement: Debridement Pre-procedure Yes - 11:26 Verification/Time Out Taken: Start Time: 11:26 Pain Control: Lidocaine 5% topical ointment Level: Skin/Subcutaneous Tissue Total Area Debrided (L x 1 (cm) x 0.4 (cm) = 0.4 (cm) W): Tissue and other Viable, Non-Viable, Fibrin/Slough, Subcutaneous material debrided: Instrument: Forceps, Scissors Bleeding: Minimum Hemostasis Achieved: Pressure End Time: 11:27 Procedural Pain: 2 Post Procedural Pain: 0 Response to Treatment: Procedure was tolerated  well Post Debridement Measurements of Total Wound Character of Wound/Ulcer Post Requires Further Debridement Debridement: Severity of Tissue Post Debridement: Fat layer exposed Post Procedure Diagnosis Same as Pre-procedure Notes subcutaneous debris, mesh was sharply removed with forceps and scissors Electronic Signature(s) Signed: 07/24/2016 11:52:09 AM By: Christin Fudge MD, FACS Signed: 07/24/2016 5:15:20 PM By: Alric Quan Entered By: Christin Fudge on 07/24/2016 11:52:09 [name] ([id number]) -------------------------------------------------------------------------------- Debridement Details Patient Name: [name] Date of Service: 07/24/2016 11:00 AM Medical Record Number: [id number] Patient Account Number: [id number] Date of Birth/Sex: Feb 13, 1946 (71 y.o. Male) Treating RN: Carolyne Fiscal, Debi Primary Care Provider: Thomes Lolling Other Clinician: Referring Provider: Thomes Lolling Treating Provider/Extender: Frann Rider in Treatment: 3 Debridement Performed for Wound #1 Right Abdomen - midline Assessment: Performed By: Physician Christin Fudge, MD Debridement: Debridement Pre-procedure Yes - 11:24 Verification/Time Out Taken: Start Time: 11:24 Pain Control: Lidocaine 5% topical ointment Level: Skin/Subcutaneous Tissue Total Area Debrided (L x 1.5 (cm) x 0.3 (cm) = 0.45 (cm) W): Tissue and other Viable, Non-Viable, Fibrin/Slough, Other, Subcutaneous material debrided: Instrument: Forceps, Scissors Bleeding: Minimum Hemostasis Achieved: Pressure End Time: 11:26 Procedural Pain: 2 Post Procedural Pain: 0 Response to Treatment: Procedure was tolerated well Post Debridement Measurements of Total Wound Character of Wound/Ulcer Post Requires Further Debridement Debridement: Severity of Tissue Post Debridement: Fat layer exposed Post Procedure Diagnosis Same as Pre-procedure Notes mesh continues to protrude from the wound and this was sharply  debrided along with some subcutaneous debris. Electronic Signature(s) Signed: 07/24/2016 11:52:20 AM By: Christin Fudge MD, FACS Signed: 07/24/2016 5:15:20 PM By: Alric Quan Previous Signature: 07/24/2016 11:50:59 AM Version By: Christin Fudge MD, FACS Entered By: Christin Fudge on 07/24/2016 11:52:19 SAADIQ, POCHE ([id number], TEUSCHER ([id number]) -------------------------------------------------------------------------------- HPI Details Patient Name: [name] Date of Service: 07/24/2016 11:00 AM Medical Record Number: [id number] Patient Account Number: [id number] Date of Birth/Sex: 06/01/1945 (71 y.o. Male) Treating RN: Carolyne Fiscal, Debi Primary Care Provider: Thomes Lolling Other Clinician: Referring Provider: Thomes Lolling Treating Provider/Extender: Christin Fudge  Weeks in Treatment: 3 History of Present Illness Location: abdomen wall wounds,-- right upper quadrant and epigastric region Quality: Patient reports experiencing a dull pain to affected area(s). Severity: Patient states wound are getting worse. Duration: Patient has had the wound for >5 year's prior to seeking treatment at the wound center Timing: Pain in wound is Intermittent (comes and goes Context: The wound appeared gradually over time Modifying Factors: Consults to this date include:not seen a surgeon and only sees his PCP for his medical complaints Associated Signs and Symptoms: Patient reports having increase discharge. HPI Description: 71 year old gentleman has been seen recently by his PCP Dr. Thomes Lolling, who sees him with a history of diabetes mellitus, peripheral neuropathy, B12 deficiency, hypertension, osteoarthritis and the patient had recent blood sugar checked which was 163. Past medical history includes essential hypertension, incisional hernia, diabetes mellitus, colon polyps, COPD, tobacco abuse in the past, chewing tobacco, GERD. He has also been treated for chronic back pain with no  sciatica and is on oxycodone. His last hemoglobin A1c was 6.9% His abdominal surgery was done at [facility] over 15 years ago and gradually there was sutures which protruded and then there was mesh which protruded. He washes this during his shower but does not put any dressing and the wound is covered with a lot of debris. He has never had a surgical consultation for this. 07/10/2016 -- the patient has spoken to his PCP who said he was going to call me but I have not yet had a phone conversation with the physician. From what I understand the physician was reluctant to have a surgical opinion because of the patient's several comorbidities and he feels that the surgery may be detrimental to his overall health 07/24/2016 -- the patient's daughter is at the bedside and we've had a detailed discussion regarding his options for a surgical opinion and possible surgical intervention before this becomes a emergent situation. She will seek primary care input and take the father to [facility] for a surgical opinion Electronic Signature(s) Signed: 07/24/2016 11:53:16 AM By: Christin Fudge MD, FACS Entered By: Christin Fudge on 07/24/2016 11:53:15 [name] ([id number]) -------------------------------------------------------------------------------- Physical Exam Details Patient Name: NORA, ROOKE Date of Service: 07/24/2016 11:00 AM Medical Record Number: [id number] Patient Account Number: [id number] Date of Birth/Sex: Jan 13, 1946 (71 y.o. Male) Treating RN: Ahmed Prima Primary Care Provider: Thomes Lolling Other Clinician: Referring Provider: Thomes Lolling Treating Provider/Extender: Frann Rider in Treatment: 3 Constitutional . Pulse regular. Respirations normal and unlabored. Afebrile. . Eyes Nonicteric. Reactive to light. Ears, Nose, Mouth, and Throat Lips, teeth, and gums WNL.[name] Moist mucosa without lesions. Neck supple and nontender. No palpable supraclavicular or  cervical adenopathy. Normal sized without goiter. Respiratory WNL. No retractions.. Breath sounds WNL, No rubs, rales, rhonchi, or wheeze.. Cardiovascular Heart rhythm and rate regular, no murmur or gallop.. Pedal Pulses WNL. No clubbing, cyanosis or edema. Lymphatic No adneopathy. No adenopathy. No adenopathy. Musculoskeletal Adexa without tenderness or enlargement.. Digits and nails w/o clubbing, cyanosis, infection, petechiae, ischemia, or inflammatory conditions.. Integumentary (Hair, Skin) No suspicious lesions. No crepitus or fluctuance. No peri-wound warmth or erythema. No masses.[name] Psychiatric Judgement and insight Intact.. No evidence of depression, anxiety, or agitation.. Notes the wounds have improved significantly but they continues to be mesh protruding and this was sharply debrided appropriately. Minimal bleeding controlled with pressure Electronic Signature(s) Signed: 07/24/2016 11:53:47 AM By: Christin Fudge MD, FACS Entered By: Christin Fudge on 07/24/2016 11:53:46 [name] ([id number]) -------------------------------------------------------------------------------- Physician  Orders Details Patient Name: RAEDEN, SCHIPPERS. Date of Service: 07/24/2016 11:00 AM Medical Record Number: [id number] Patient Account Number: [id number] Date of Birth/Sex: Jul 28, 1945 (71 y.o. Male) Treating RN: Carolyne Fiscal, Debi Primary Care Provider: Thomes Lolling Other Clinician: Referring Provider: Thomes Lolling Treating Provider/Extender: Frann Rider in Treatment: 3 Verbal / Phone Orders: Yes Clinician: Pinkerton, Debi Read Back and Verified: Yes Diagnosis Coding Wound Cleansing Wound #1 Right Abdomen - midline o Clean wound with Normal Saline. o May Shower, gently pat wound dry prior to applying new dressing. Wound #2 Left Abdomen - midline o Clean wound with Normal Saline. o May Shower, gently pat wound dry prior to applying new dressing. Anesthetic Wound #1 Right  Abdomen - midline o Topical Lidocaine 4% cream applied to wound bed prior to debridement Wound #2 Left Abdomen - midline o Topical Lidocaine 4% cream applied to wound bed prior to debridement Skin Barriers/Peri-Wound Care Wound #1 Right Abdomen - midline o Skin Prep Wound #2 Left Abdomen - midline o Skin Prep Primary Wound Dressing Wound #1 Right Abdomen - midline o Aquacel Ag Wound #2 Left Abdomen - midline o Aquacel Ag Secondary Dressing Wound #1 Right Abdomen - midline o Boardered Foam Dressing Wound #2 Left Abdomen - midline o Boardered Foam Dressing LYDELL, MOGA ([id number]) Dressing Change Frequency Wound #1 Right Abdomen - midline o Change dressing every other day. Wound #2 Left Abdomen - midline o Change dressing every other day. Follow-up Appointments Wound #1 Right Abdomen - midline o Return Appointment in 1 week. Wound #2 Left Abdomen - midline o Return Appointment in 1 week. Additional Orders / Instructions Wound #1 Right Abdomen - midline o Other: - Go see a Engineer, drilling about your hernias and get a surgical opinion. Wound #2 Left Abdomen - midline o Other: - Go see a Engineer, drilling about your hernias and get a surgical opinion. Electronic Signature(s) Signed: 07/24/2016 4:48:28 PM By: Christin Fudge MD, FACS Signed: 07/24/2016 5:15:20 PM By: Alric Quan Entered By: Alric Quan on 07/24/2016 11:22:09 [name] ([id number]) -------------------------------------------------------------------------------- Problem List Details Patient Name: EMANUAL, LAMOUNTAIN Date of Service: 07/24/2016 11:00 AM Medical Record Number: [id number] Patient Account Number: [id number] Date of Birth/Sex: September 17, 1945 (71 y.o. Male) Treating RN: Ahmed Prima Primary Care Provider: Thomes Lolling Other Clinician: Referring Provider: Thomes Lolling Treating Provider/Extender: Frann Rider in Treatment: 3 Active  Problems ICD-10 Encounter Code Description Active Date Diagnosis E11.622 Type 2 diabetes mellitus with other skin ulcer 07/03/2016 Yes S31.100A Unspecified open wound of abdominal wall, right upper 07/03/2016 Yes quadrant without penetration into peritoneal cavity, initial encounter S31.102A Unspecified open wound of abdominal wall, epigastric 07/03/2016 Yes region without penetration into peritoneal cavity, initial encounter F17.228 Nicotine dependence, chewing tobacco, with other 07/03/2016 Yes nicotine-induced disorders Inactive Problems Resolved Problems Electronic Signature(s) Signed: 07/24/2016 11:49:27 AM By: Christin Fudge MD, FACS Entered By: Christin Fudge on 07/24/2016 11:49:26 [name] ([id number]) -------------------------------------------------------------------------------- Progress Note Details Patient Name: [name] Date of Service: 07/24/2016 11:00 AM Medical Record Number: [id number] Patient Account Number: [id number] Date of Birth/Sex: 10-19-1945 (71 y.o. Male) Treating RN: Ahmed Prima Primary Care Provider: Thomes Lolling Other Clinician: Referring Provider: Thomes Lolling Treating Provider/Extender: Frann Rider in Treatment: 3 Subjective Chief Complaint Information obtained from Patient Patients presents for treatment of an open diabetic ulcer to the anterior abdominal wall in the epigastric and right upper quadrant which she's had for over 5 years History of Present Illness (HPI) The following HPI elements were documented for  the patient's wound: Location: abdomen wall wounds,-- right upper quadrant and epigastric region Quality: Patient reports experiencing a dull pain to affected area(s). Severity: Patient states wound are getting worse. Duration: Patient has had the wound for >5 year's prior to seeking treatment at the wound center Timing: Pain in wound is Intermittent (comes and goes Context: The wound appeared gradually over  time Modifying Factors: Consults to this date include:not seen a surgeon and only sees his PCP for his medical complaints Associated Signs and Symptoms: Patient reports having increase discharge. 71 year old gentleman has been seen recently by his PCP Dr. Thomes Lolling, who sees him with a history of diabetes mellitus, peripheral neuropathy, B12 deficiency, hypertension, osteoarthritis and the patient had recent blood sugar checked which was 163. Past medical history includes essential hypertension, incisional hernia, diabetes mellitus, colon polyps, COPD, tobacco abuse in the past, chewing tobacco, GERD. He has also been treated for chronic back pain with no sciatica and is on oxycodone. His last hemoglobin A1c was 6.9% His abdominal surgery was done at [facility] over 15 years ago and gradually there was sutures which protruded and then there was mesh which protruded. He washes this during his shower but does not put any dressing and the wound is covered with a lot of debris. He has never had a surgical consultation for this. 07/10/2016 -- the patient has spoken to his PCP who said he was going to call me but I have not yet had a phone conversation with the physician. From what I understand the physician was reluctant to have a surgical opinion because of the patient's several comorbidities and he feels that the surgery may be detrimental to his overall health 07/24/2016 -- the patient's daughter is at the bedside and we've had a detailed discussion regarding his options for a surgical opinion and possible surgical intervention before this becomes a emergent situation. She will seek primary care input and take the father to [facility] for a surgical opinion RANDY, CASTREJON ([id number]) Objective Constitutional Pulse regular. Respirations normal and unlabored. Afebrile. Vitals Time Taken: 11:04 AM, Height: 65 in, Weight: 161 lbs, BMI: 26.8, Temperature: 97.5 F, Pulse: 84 bpm,  Respiratory Rate: 18 breaths/min, Blood Pressure: 177/78 mmHg. General Notes: Made Dr. Con Memos aware of BP. Eyes Nonicteric. Reactive to light. Ears, Nose, Mouth, and Throat Lips, teeth, and gums WNL.[name] Moist mucosa without lesions. Neck supple and nontender. No palpable supraclavicular or cervical adenopathy. Normal sized without goiter. Respiratory WNL. No retractions.. Breath sounds WNL, No rubs, rales, rhonchi, or wheeze.. Cardiovascular Heart rhythm and rate regular, no murmur or gallop.. Pedal Pulses WNL. No clubbing, cyanosis or edema. Lymphatic No adneopathy. No adenopathy. No adenopathy. Musculoskeletal Adexa without tenderness or enlargement.. Digits and nails w/o clubbing, cyanosis, infection, petechiae, ischemia, or inflammatory conditions.[name] Psychiatric Judgement and insight Intact.. No evidence of depression, anxiety, or agitation.. General Notes: the wounds have improved significantly but they continues to be mesh protruding and this was sharply debrided appropriately. Minimal bleeding controlled with pressure Integumentary (Hair, Skin) No suspicious lesions. No crepitus or fluctuance. No peri-wound warmth or erythema. No masses.. Wound #1 status is Open. Original cause of wound was Gradually Appeared. The wound is located on the Right Abdomen - midline. The wound measures 1.5cm length x 0.3cm width x 0.1cm depth; 0.353cm^2 area and 0.035cm^3 volume. The wound is limited to skin breakdown. There is no tunneling or undermining noted. There is a medium amount of serous drainage noted. The wound margin is flat and  intact. There is large (67-100%) red granulation within the wound bed. There is a small (1-33%) amount of necrotic tissue ABDURAHMAN, RUGG. ([id number]) within the wound bed including Eschar. The periwound skin appearance did not exhibit: Callus, Crepitus, Excoriation, Induration, Rash, Scarring, Dry/Scaly, Maceration, Atrophie Blanche, Cyanosis, Ecchymosis, Hemosiderin  Staining, Mottled, Pallor, Rubor, Erythema. Periwound temperature was noted as No Abnormality. Wound #2 status is Open. Original cause of wound was Gradually Appeared. The wound is located on the Left Abdomen - midline. The wound measures 4cm length x 0.4cm width x 0.1cm depth; 1.257cm^2 area and 0.126cm^3 volume. The wound is limited to skin breakdown. There is no tunneling or undermining noted. There is a medium amount of serous drainage noted. The wound margin is flat and intact. There is large (67-100%) red granulation within the wound bed. There is a small (1-33%) amount of necrotic tissue within the wound bed including Eschar. The periwound skin appearance did not exhibit: Callus, Crepitus, Excoriation, Induration, Rash, Scarring, Dry/Scaly, Maceration, Atrophie Blanche, Cyanosis, Ecchymosis, Hemosiderin Staining, Mottled, Pallor, Rubor, Erythema. Periwound temperature was noted as No Abnormality. Assessment Active Problems ICD-10 E11.622 - Type 2 diabetes mellitus with other skin ulcer S31.100A - Unspecified open wound of abdominal wall, right upper quadrant without penetration into peritoneal cavity, initial encounter S31.102A - Unspecified open wound of abdominal wall, epigastric region without penetration into peritoneal cavity, initial encounter F17.228 - Nicotine dependence, chewing tobacco, with other nicotine-induced disorders Procedures Wound #1 Wound #1 is an Atypical located on the Right Abdomen - midline . There was a Skin/Subcutaneous Tissue Debridement ([id number]) debridement with total area of 0.45 sq cm performed by Christin Fudge, MD. with the following instrument(s): Forceps and Scissors to remove Viable and Non-Viable tissue/material including Fibrin/Slough, Other, and Subcutaneous after achieving pain control using Lidocaine 5% topical ointment. A time out was conducted at 11:24, prior to the start of the procedure. A Minimum amount of bleeding was controlled  with Pressure. The procedure was tolerated well with a pain level of 2 throughout and a pain level of 0 following the procedure. Character of Wound/Ulcer Post Debridement requires further debridement. Severity of Tissue Post Debridement is: Fat layer exposed. Post procedure Diagnosis Wound #1: Same as Pre-Procedure General Notes: mesh continues to protrude from the wound and this was sharply debrided along with some subcutaneous debris.[name] DWON, SKY ([id number]) Wound #2 Wound #2 is an Atypical located on the Left Abdomen - midline . There was a Skin/Subcutaneous Tissue Debridement ([id number]) debridement with total area of 0.4 sq cm performed by Christin Fudge, MD. with the following instrument(s): Forceps and Scissors to remove Viable and Non-Viable tissue/material including Fibrin/Slough and Subcutaneous after achieving pain control using Lidocaine 5% topical ointment. A time out was conducted at 11:26, prior to the start of the procedure. A Minimum amount of bleeding was controlled with Pressure. The procedure was tolerated well with a pain level of 2 throughout and a pain level of 0 following the procedure. Character of Wound/Ulcer Post Debridement requires further debridement. Severity of Tissue Post Debridement is: Fat layer exposed. Post procedure Diagnosis Wound #2: Same as Pre-Procedure General Notes: subcutaneous debris, mesh was sharply removed with forceps and scissors. Plan Wound Cleansing: Wound #1 Right Abdomen - midline: Clean wound with Normal Saline. May Shower, gently pat wound dry prior to applying new dressing. Wound #2 Left Abdomen - midline: Clean wound with Normal Saline. May Shower, gently pat wound dry prior to applying new dressing. Anesthetic: Wound #1 Right Abdomen - midline: Topical  Lidocaine 4% cream applied to wound bed prior to debridement Wound #2 Left Abdomen - midline: Topical Lidocaine 4% cream applied to wound bed prior to debridement Skin  Barriers/Peri-Wound Care: Wound #1 Right Abdomen - midline: Skin Prep Wound #2 Left Abdomen - midline: Skin Prep Primary Wound Dressing: Wound #1 Right Abdomen - midline: Aquacel Ag Wound #2 Left Abdomen - midline: Aquacel Ag Secondary Dressing: Wound #1 Right Abdomen - midline: Boardered Foam Dressing Wound #2 Left Abdomen - midline: Boardered Foam Dressing Dressing Change Frequency: Wound #1 Right Abdomen - midline: Change dressing every other day. KEIYON, PLACK ([id number]) Wound #2 Left Abdomen - midline: Change dressing every other day. Follow-up Appointments: Wound #1 Right Abdomen - midline: Return Appointment in 1 week. Wound #2 Left Abdomen - midline: Return Appointment in 1 week. Additional Orders / Instructions: Wound #1 Right Abdomen - midline: Other: - Go see a Engineer, drilling about your hernias and get a surgical opinion. Wound #2 Left Abdomen - midline: Other: - Go see a Engineer, drilling about your hernias and get a surgical opinion. The patient is here today with his daughter, who is her caregiver and have spent a considerable amount of time to discuss a detailed treatment plan. The patient has a large incisional hernia and in some areas there is evidence of protruding mesh, from the subcutaneous tissue. I recommend a CT scan and a surgical opinion. I have recommended: 1. Silver alginate and a bordered foam to be applied daily after washing with soap and water. 2. I would highly recommend a surgical opinion at [facility] for further workup including CT scan and possible revision surgery for this large incisional hernia. 3. Good control of his diabetes mellitus 4. Adequate protein, vitamin A, vitamin C and zinc. Electronic Signature(s) Signed: 07/24/2016 11:55:46 AM By: Christin Fudge MD, FACS Entered By: Christin Fudge on 07/24/2016 11:55:46 [name] ([id number]) -------------------------------------------------------------------------------- SuperBill  Details Patient Name: [name] Date of Service: 07/24/2016 Medical Record Number: [id number] Patient Account Number: [id number] Date of Birth/Sex: 1945-11-19 (71 y.o. Male) Treating RN: Carolyne Fiscal, Debi Primary Care Provider: Thomes Lolling Other Clinician: Referring Provider: Thomes Lolling Treating Provider/Extender: Frann Rider in Treatment: 3 Diagnosis Coding ICD-10 Codes Code Description E11.622 Type 2 diabetes mellitus with other skin ulcer Unspecified open wound of abdominal wall, right upper quadrant without penetration into S31.100A peritoneal cavity, initial encounter Unspecified open wound of abdominal wall, epigastric region without penetration into S31.102A peritoneal cavity, initial encounter F17.228 Nicotine dependence, chewing tobacco, with other nicotine-induced disorders Facility Procedures CPT4: Description Modifier Quantity Code 19379024 11042 - DEB SUBQ TISSUE 20 SQ CM/< 1 ICD-10 Description Diagnosis E11.622 Type 2 diabetes mellitus with other skin ulcer S31.100A Unspecified open wound of abdominal wall, right upper quadrant without  penetration into peritoneal cavity, initial encounter S31.102A Unspecified open wound of abdominal wall, epigastric region without penetration into peritoneal cavity, initial encounter Physician Procedures CPT4: Description Modifier Quantity Code [id number] - WC PHYS SUBQ TISS 20 SQ CM 1 ICD-10 Description Diagnosis E11.622 Type 2 diabetes mellitus with other skin ulcer S31.100A Unspecified open wound of abdominal wall, right upper quadrant without  penetration into peritoneal cavity, initial encounter S31.102A Unspecified open wound of abdominal wall, epigastric region without penetration into peritoneal cavity, initial encounter Electronic Signature(s) [name], DRAGAN ([id number]) Signed: 07/24/2016 11:56:52 AM By: Christin Fudge MD, FACS Entered By: Christin Fudge on 07/24/2016 11:56:51

## 2016-07-25 NOTE — Progress Notes (Signed)
[name], [name] ([id number]) Visit Report for 07/24/2016 Arrival Information Details Patient Name: [name], [name]. Date of Service: 07/24/2016 11:00 AM Medical Record Number: [id number] Patient Account Number: [id number] Date of Birth/Sex: 1945-10-21 (71 y.o. Male) Treating RN: Ahmed Prima Primary Care Glennys Schorsch: Thomes Lolling Other Clinician: Referring Shaylea Ucci: Thomes Lolling Treating Dione Mccombie/Extender: Frann Rider in Treatment: 3 Visit Information History Since Last Visit All ordered tests and consults were completed: No Patient Arrived: Ambulatory Added or deleted any medications: No Arrival Time: 11:02 Any new allergies or adverse reactions: No Accompanied By: daughter Had a fall or experienced change in No Transfer Assistance: None activities of daily living that may affect Patient Identification Verified: Yes risk of falls: Secondary Verification Process Yes Signs or symptoms of abuse/neglect since last No Completed: visito Patient Requires Transmission-Based No Hospitalized since last visit: No Precautions: Has Dressing in Place as Prescribed: Yes Patient Has Alerts: Yes Pain Present Now: No Patient Alerts: DMII Electronic Signature(s) Signed: 07/24/2016 5:15:20 PM By: Alric Quan Entered By: Alric Quan on 07/24/2016 11:03:02 [name] ([id number]) -------------------------------------------------------------------------------- Encounter Discharge Information Details Patient Name: [name] Date of Service: 07/24/2016 11:00 AM Medical Record Number: [id number] Patient Account Number: [id number] Date of Birth/Sex: [birth year] (71 y.o. Male) Treating RN: Ahmed Prima Primary Care Jariel Drost: Thomes Lolling Other Clinician: Referring Nyomie Ehrlich: Thomes Lolling Treating Rueben Kassim/Extender: Frann Rider in Treatment: 3 Encounter Discharge Information Items Discharge Pain Level: 0 Discharge Condition: Stable Ambulatory Status:  Ambulatory Discharge Destination: Home Transportation: Private Auto Accompanied By: daughter Schedule Follow-up Appointment: Yes Medication Reconciliation completed and provided to Patient/Care No Mohammed Mcandrew: Provided on Clinical Summary of Care: 07/24/2016 Form Type Recipient Paper Patient FB Electronic Signature(s) Signed: 07/24/2016 11:31:14 AM By: [name] Entered By: [name] on 07/24/2016 11:31:14 [name] ([id number]) -------------------------------------------------------------------------------- Lower Extremity Assessment Details Patient Name: [name] Date of Service: 07/24/2016 11:00 AM Medical Record Number: [id number] Patient Account Number: [id number] Date of Birth/Sex: 1945/12/08 (71 y.o. Male) Treating RN: Ahmed Prima Primary Care Sayid Moll: Thomes Lolling Other Clinician: Referring Lowanda Cashaw: Thomes Lolling Treating Earnstine Meinders/Extender: Frann Rider in Treatment: 3 Electronic Signature(s) Signed: 07/24/2016 5:15:20 PM By: Alric Quan Entered By: Alric Quan on 07/24/2016 11:11:51 [name] ([id number]) -------------------------------------------------------------------------------- Multi Wound Chart Details Patient Name: [name] Date of Service: 07/24/2016 11:00 AM Medical Record Number: [id number] Patient Account Number: [id number] Date of Birth/Sex: [birth year] (71 y.o. Male) Treating RN: Carolyne Fiscal, Debi Primary Care Juvia Aerts: Thomes Lolling Other Clinician: Referring Dandra Shambaugh: Thomes Lolling Treating Tomaz Janis/Extender: Frann Rider in Treatment: 3 Vital Signs Height(in): 65 Pulse(bpm): 84 Weight(lbs): 161 Blood Pressure 177/78 (mmHg): Body Mass Index(BMI): 27 Temperature(F): 97.5 Respiratory Rate 18 (breaths/min): Photos: [1:No Photos] [2:No Photos] [N/A:N/A] Wound Location: [1:Right Abdomen - midline] [2:Left Abdomen - midline] [N/A:N/A] Wounding Event: [1:Gradually Appeared] [2:Gradually  Appeared] [N/A:N/A] Primary Etiology: [1:Atypical] [2:Atypical] [N/A:N/A] Comorbid History: [1:Cataracts, Chronic Obstructive Pulmonary Disease (COPD), Hypertension, Type II Diabetes, Osteoarthritis, Neuropathy] [2:Cataracts, Chronic Obstructive Pulmonary Disease (COPD), Hypertension, Type II Diabetes, Osteoarthritis,  Neuropathy] [N/A:N/A] Date Acquired: [1:05/17/2015] [2:05/17/2015] [N/A:N/A] Weeks of Treatment: [1:3] [2:3] [N/A:N/A] Wound Status: [1:Open] [2:Open] [N/A:N/A] Measurements L x W x D 1.5x0.3x0.1 [2:4x0.4x0.1] [N/A:N/A] (cm) Area (cm) : [1:0.353] [2:1.257] [N/A:N/A] Volume (cm) : [1:0.035] [2:0.126] [N/A:N/A] % Reduction in Area: [1:89.70%] [2:76.20%] [N/A:N/A] % Reduction in Volume: 89.80% [2:76.10%] [N/A:N/A] Classification: [1:Full Thickness Without Exposed Support Structures] [2:Full Thickness Without Exposed Support Structures] [N/A:N/A] Exudate Amount: [1:Medium] [2:Medium] [N/A:N/A] Exudate Type: [1:Serous] [2:Serous] [N/A:N/A] Exudate Color: [1:amber] [2:amber] [N/A:N/A] Wound Margin: [1:Flat and  Intact] [2:Flat and Intact] [N/A:N/A] Granulation Amount: [1:Large (67-100%)] [2:Large (67-100%)] [N/A:N/A] Granulation Quality: [1:Red] [2:Red] [N/A:N/A] Necrotic Amount: [1:Small (1-33%)] [2:Small (1-33%)] [N/A:N/A] Necrotic Tissue: [1:Eschar] [2:Eschar] [N/A:N/A] Exposed Structures: Fascia: No Fascia: No N/A Fat Layer (Subcutaneous Fat Layer (Subcutaneous Tissue) Exposed: No Tissue) Exposed: No Tendon: No Tendon: No Muscle: No Muscle: No Joint: No Joint: No Bone: No Bone: No Limited to Skin Limited to Skin Breakdown Breakdown Epithelialization: None None N/A Debridement: N/A N/A N/A Periwound Skin Texture: Excoriation: No Excoriation: No N/A Induration: No Induration: No Callus: No Callus: No Crepitus: No Crepitus: No Rash: No Rash: No Scarring: No Scarring: No Periwound Skin Maceration: No Maceration: No N/A Moisture: Dry/Scaly: No Dry/Scaly:  No Periwound Skin Color: Atrophie Blanche: No Atrophie Blanche: No N/A Cyanosis: No Cyanosis: No Ecchymosis: No Ecchymosis: No Erythema: No Erythema: No Hemosiderin Staining: No Hemosiderin Staining: No Mottled: No Mottled: No Pallor: No Pallor: No Rubor: No Rubor: No Temperature: No Abnormality No Abnormality N/A Tenderness on No No N/A Palpation: Wound Preparation: Ulcer Cleansing: Ulcer Cleansing: N/A Rinsed/Irrigated with Rinsed/Irrigated with Saline Saline Topical Anesthetic Topical Anesthetic Applied: Other: lidocaine Applied: Other: lidocaine 4% 4% Procedures Performed: Debridement Debridement N/A Treatment Notes Wound #1 (Right Abdomen - midline) 1. Cleansed with: Clean wound with Normal Saline 2. Anesthetic Topical Lidocaine 4% cream to wound bed prior to debridement 3. Peri-wound Care: Skin Prep 4. Dressing Applied: Aquacel Ag [name], [name] ([id number]) 5. Secondary Dressing Applied Bordered Foam Dressing Wound #2 (Left Abdomen - midline) 1. Cleansed with: Clean wound with Normal Saline 2. Anesthetic Topical Lidocaine 4% cream to wound bed prior to debridement 3. Peri-wound Care: Skin Prep 4. Dressing Applied: Aquacel Ag 5. Secondary Dressing Applied Bordered Foam Dressing Electronic Signature(s) Signed: 07/24/2016 11:49:35 AM By: Christin Fudge MD, FACS Entered By: Christin Fudge on 07/24/2016 11:49:35 [name] ([id number]) -------------------------------------------------------------------------------- [place] Details Patient Name: [name], [name] Date of Service: 07/24/2016 11:00 AM Medical Record Number: [id number] Patient Account Number: [id number] Date of Birth/Sex: 1945/09/14 (71 y.o. Male) Treating RN: Carolyne Fiscal, Debi Primary Care Kaprice Kage: Thomes Lolling Other Clinician: Referring Maymunah Stegemann: Thomes Lolling Treating Savannah Erbe/Extender: Frann Rider in Treatment: 3 Active Inactive ` Abuse / Safety / Falls  / Self Care Management Nursing Diagnoses: Impaired physical mobility Goals: Patient will remain injury free Date Initiated: 07/03/2016 Target Resolution Date: 09/14/2016 Goal Status: Active Interventions: Assess fall risk on admission and as needed Notes: ` Orientation to the Wound Care Program Nursing Diagnoses: Knowledge deficit related to the wound healing center program Goals: Patient/caregiver will verbalize understanding of the [place] Date Initiated: 07/03/2016 Target Resolution Date: 09/15/2016 Goal Status: Active Interventions: Provide education on orientation to the wound center Notes: ` Wound/Skin Impairment Nursing Diagnoses: Impaired tissue integrity [name], [name] ([id number]) Goals: Patient/caregiver will verbalize understanding of skin care regimen Date Initiated: 07/03/2016 Target Resolution Date: 09/15/2016 Goal Status: Active Ulcer/skin breakdown will have a volume reduction of 30% by week 4 Date Initiated: 07/03/2016 Target Resolution Date: 09/15/2016 Goal Status: Active Ulcer/skin breakdown will have a volume reduction of 50% by week 8 Date Initiated: 07/03/2016 Target Resolution Date: 09/15/2016 Goal Status: Active Ulcer/skin breakdown will have a volume reduction of 80% by week 12 Date Initiated: 07/03/2016 Target Resolution Date: 09/15/2016 Goal Status: Active Ulcer/skin breakdown will heal within 14 weeks Date Initiated: 07/03/2016 Target Resolution Date: 09/15/2016 Goal Status: Active Interventions: Assess patient/caregiver ability to obtain necessary supplies Assess patient/caregiver ability to perform ulcer/skin care regimen upon admission and as needed Assess  ulceration(s) every visit Notes: Electronic Signature(s) Signed: 07/24/2016 5:15:20 PM By: Alric Quan Entered By: Alric Quan on 07/24/2016 11:11:56 [name] ([id number]) -------------------------------------------------------------------------------- Pain  Assessment Details Patient Name: [name] Date of Service: 07/24/2016 11:00 AM Medical Record Number: [id number] Patient Account Number: [id number] Date of Birth/Sex: [birth year] (71 y.o. Male) Treating RN: Ahmed Prima Primary Care Adda Stokes: Thomes Lolling Other Clinician: Referring Cristian Davitt: Thomes Lolling Treating Shalisa Mcquade/Extender: Frann Rider in Treatment: 3 Active Problems Location of Pain Severity and Description of Pain Patient Has Paino No Site Locations With Dressing Change: No Pain Management and Medication Current Pain Management: Electronic Signature(s) Signed: 07/24/2016 5:15:20 PM By: Alric Quan Entered By: Alric Quan on 07/24/2016 11:03:18 [name] ([id number]) -------------------------------------------------------------------------------- Patient/Caregiver Education Details Patient Name: [name] Date of Service: 07/24/2016 11:00 AM Medical Record Number: [id number] Patient Account Number: [id number] Date of Birth/Gender: 10-Jul-1945 (71 y.o. Male) Treating RN: Ahmed Prima Primary Care Physician: Thomes Lolling Other Clinician: Referring Physician: Thomes Lolling Treating Physician/Extender: Frann Rider in Treatment: 3 Education Assessment Education Provided To: Patient Education Topics Provided Wound/Skin Impairment: Handouts: Other: change dressing as ordered Methods: Demonstration, Explain/Verbal Responses: State content correctly Electronic Signature(s) Signed: 07/24/2016 5:15:20 PM By: Alric Quan Entered By: Alric Quan on 07/24/2016 11:16:26 [name] ([id number]) -------------------------------------------------------------------------------- Wound Assessment Details Patient Name: [name] Date of Service: 07/24/2016 11:00 AM Medical Record Number: [id number] Patient Account Number: [id number] Date of Birth/Sex: 06/13/1945 (71 y.o. Male) Treating RN: Carolyne Fiscal, Debi Primary  Care Quy Lotts: Thomes Lolling Other Clinician: Referring Shraddha Lebron: Thomes Lolling Treating Preslynn Bier/Extender: Frann Rider in Treatment: 3 Wound Status Wound Number: 1 Primary Atypical Etiology: Wound Location: Right Abdomen - midline Wound Open Wounding Event: Gradually Appeared Status: Date Acquired: 05/17/2015 Comorbid Cataracts, Chronic Obstructive Weeks Of Treatment: 3 History: Pulmonary Disease (COPD), Clustered Wound: No Hypertension, Type II Diabetes, Osteoarthritis, Neuropathy Photos Photo Uploaded By: Alric Quan on 07/24/2016 13:57:22 Wound Measurements Length: (cm) 1.5 Width: (cm) 0.3 Depth: (cm) 0.1 Area: (cm) 0.353 Volume: (cm) 0.035 % Reduction in Area: 89.7% % Reduction in Volume: 89.8% Epithelialization: None Tunneling: No Undermining: No Wound Description Full Thickness Without Exposed Classification: Support Structures Wound Margin: Flat and Intact Exudate Medium Amount: Exudate Type: Serous Exudate Color: amber Foul Odor After Cleansing: No Slough/Fibrino No Wound Bed Granulation Amount: Large (67-100%) Exposed Structure [name], [name] ([id number]) Granulation Quality: Red Fascia Exposed: No Necrotic Amount: Small (1-33%) Fat Layer (Subcutaneous Tissue) Exposed: No Necrotic Quality: Eschar Tendon Exposed: No Muscle Exposed: No Joint Exposed: No Bone Exposed: No Limited to Skin Breakdown Periwound Skin Texture Texture Color No Abnormalities Noted: No No Abnormalities Noted: No Callus: No Atrophie Blanche: No Crepitus: No Cyanosis: No Excoriation: No Ecchymosis: No Induration: No Erythema: No Rash: No Hemosiderin Staining: No Scarring: No Mottled: No Pallor: No Moisture Rubor: No No Abnormalities Noted: No Dry / Scaly: No Temperature / Pain Maceration: No Temperature: No Abnormality Wound Preparation Ulcer Cleansing: Rinsed/Irrigated with Saline Topical Anesthetic Applied: Other: lidocaine 4%, Treatment  Notes Wound #1 (Right Abdomen - midline) 1. Cleansed with: Clean wound with Normal Saline 2. Anesthetic Topical Lidocaine 4% cream to wound bed prior to debridement 3. Peri-wound Care: Skin Prep 4. Dressing Applied: Aquacel Ag 5. Secondary Dressing Applied Bordered Foam Dressing Electronic Signature(s) Signed: 07/24/2016 5:15:20 PM By: Alric Quan Entered By: Alric Quan on 07/24/2016 11:10:49 [name] ([id number]) -------------------------------------------------------------------------------- Wound Assessment Details Patient Name: [name] Date of Service: 07/24/2016 11:00 AM Medical Record Number: [id number] Patient Account  Number: [id number] Date of Birth/Sex: 07/14/1945 (71 y.o. Male) Treating RN: Carolyne Fiscal, Debi Primary Care Bianey Tesoro: Thomes Lolling Other Clinician: Referring Zekiel Torian: Thomes Lolling Treating Lennart Gladish/Extender: Frann Rider in Treatment: 3 Wound Status Wound Number: 2 Primary Atypical Etiology: Wound Location: Left Abdomen - midline Wound Open Wounding Event: Gradually Appeared Status: Date Acquired: 05/17/2015 Comorbid Cataracts, Chronic Obstructive Weeks Of Treatment: 3 History: Pulmonary Disease (COPD), Clustered Wound: No Hypertension, Type II Diabetes, Osteoarthritis, Neuropathy Photos Photo Uploaded By: Alric Quan on 07/24/2016 13:58:00 Wound Measurements Length: (cm) 4 Width: (cm) 0.4 Depth: (cm) 0.1 Area: (cm) 1.257 Volume: (cm) 0.126 % Reduction in Area: 76.2% % Reduction in Volume: 76.1% Epithelialization: None Tunneling: No Undermining: No Wound Description Full Thickness Without Exposed Classification: Support Structures Wound Margin: Flat and Intact Exudate Medium Amount: Exudate Type: Serous Exudate Color: amber Foul Odor After Cleansing: No Slough/Fibrino No Wound Bed Granulation Amount: Large (67-100%) Exposed Structure [name], [name] ([id number]) Granulation Quality:  Red Fascia Exposed: No Necrotic Amount: Small (1-33%) Fat Layer (Subcutaneous Tissue) Exposed: No Necrotic Quality: Eschar Tendon Exposed: No Muscle Exposed: No Joint Exposed: No Bone Exposed: No Limited to Skin Breakdown Periwound Skin Texture Texture Color No Abnormalities Noted: No No Abnormalities Noted: No Callus: No Atrophie Blanche: No Crepitus: No Cyanosis: No Excoriation: No Ecchymosis: No Induration: No Erythema: No Rash: No Hemosiderin Staining: No Scarring: No Mottled: No Pallor: No Moisture Rubor: No No Abnormalities Noted: No Dry / Scaly: No Temperature / Pain Maceration: No Temperature: No Abnormality Wound Preparation Ulcer Cleansing: Rinsed/Irrigated with Saline Topical Anesthetic Applied: Other: lidocaine 4%, Treatment Notes Wound #2 (Left Abdomen - midline) 1. Cleansed with: Clean wound with Normal Saline 2. Anesthetic Topical Lidocaine 4% cream to wound bed prior to debridement 3. Peri-wound Care: Skin Prep 4. Dressing Applied: Aquacel Ag 5. Secondary Dressing Applied Bordered Foam Dressing Electronic Signature(s) Signed: 07/24/2016 5:15:20 PM By: Alric Quan Entered By: Alric Quan on 07/24/2016 11:11:42 [name] ([id number]) -------------------------------------------------------------------------------- [place] Details Patient Name: [name] Date of Service: 07/24/2016 11:00 AM Medical Record Number: [id number] Patient Account Number: [id number] Date of Birth/Sex: Apr 25, 1945 (71 y.o. Male) Treating RN: Carolyne Fiscal, Debi Primary Care Sonali Wivell: Thomes Lolling Other Clinician: Referring Zofia Peckinpaugh: Thomes Lolling Treating Klara Stjames/Extender: Frann Rider in Treatment: 3 Vital Signs Time Taken: 11:04 Temperature (F): 97.5 Height (in): 65 Pulse (bpm): 84 Weight (lbs): 161 Respiratory Rate (breaths/min): 18 Body Mass Index (BMI): 26.8 Blood Pressure (mmHg): 177/78 Reference Range: 80 - 120 mg /  dl Notes Made Dr. Con Memos aware of BP. Electronic Signature(s) Signed: 07/24/2016 5:15:20 PM By: Alric Quan Entered By: Alric Quan on 07/24/2016 11:05:35

## 2016-07-31 ENCOUNTER — Encounter: Payer: Medicare Other | Admitting: Surgery

## 2016-07-31 DIAGNOSIS — E11622 Type 2 diabetes mellitus with other skin ulcer: Secondary | ICD-10-CM | POA: Diagnosis not present

## 2016-08-01 NOTE — Progress Notes (Signed)
[name] ([id number]) Visit Report for 07/31/2016 Chief Complaint Document Details Patient Name: [name], HARTS. Date of Service: 07/31/2016 10:15 AM Medical Record Number: [id number] Patient Account Number: [id number] Date of Birth/Sex: Aug 11, 1945 (71 y.o. Male) Treating RN: [name] Primary Care Provider: Thomes Jackson Other Clinician: Referring Provider: Thomes Jackson Treating Provider/Extender: [name] in Treatment: 4 Information Obtained from: Patient Chief Complaint Patients presents for treatment of an open diabetic ulcer to the anterior abdominal wall in the epigastric and right upper quadrant which she's had for over 5 years Electronic Signature(s) Signed: 07/31/2016 10:35:42 AM By: Michael Fudge MD, FACS Entered By: [name] on 07/31/2016 10:35:41 [name] ([id number]) -------------------------------------------------------------------------------- HPI Details Patient Name: [name] Date of Service: 07/31/2016 10:15 AM Medical Record Number: [id number] Patient Account Number: [id number] Date of Birth/Sex: 1946/03/13 (71 y.o. Male) Treating RN: [name] Primary Care Provider: Thomes Jackson Other Clinician: Referring Provider: Thomes Jackson Treating Provider/Extender: [name] in Treatment: 4 History of Present Illness Location: abdomen wall wounds,-- right upper quadrant and epigastric region Quality: Patient reports experiencing a dull pain to affected area(s). Severity: Patient states wound are getting worse. Duration: Patient has had the wound for >5 year's prior to seeking treatment at the wound center Timing: Pain in wound is Intermittent (comes and goes Context: The wound appeared gradually over time Modifying Factors: Consults to this date include:not seen a surgeon and only sees his PCP for his medical complaints Associated Signs and Symptoms: Patient reports having increase discharge. HPI Description:  71 year old gentleman has been seen recently by his PCP Dr. Thomes Jackson, who sees him with a history of diabetes mellitus, peripheral neuropathy, B12 deficiency, hypertension, osteoarthritis and the patient had recent blood sugar checked which was 163. Past medical history includes essential hypertension, incisional hernia, diabetes mellitus, colon polyps, COPD, tobacco abuse in the past, chewing tobacco, GERD. He has also been treated for chronic back pain with no sciatica and is on oxycodone. His last hemoglobin A1c was 6.9% His abdominal surgery was done at [facility] over 15 years ago and gradually there was sutures which protruded and then there was mesh which protruded. He washes this during his shower but does not put any dressing and the wound is covered with a lot of debris. He has never had a surgical consultation for this. 07/10/2016 -- the patient has spoken to his PCP who said he was going to call me but I have not yet had a phone conversation with the physician. From what I understand the physician was reluctant to have a surgical opinion because of the patient's several comorbidities and he feels that the surgery may be detrimental to his overall health 07/24/2016 -- the patient's daughter is at the bedside and we've had a detailed discussion regarding his options for a surgical opinion and possible surgical intervention before this becomes a emergent situation. She will seek primary care input and take the father to [facility] for a surgical opinion. 07/31/2016 -- the patient's daughter has organized for a surgical opinion at [facility] on June 14 Electronic Signature(s) Signed: 07/31/2016 10:36:04 AM By: Michael Fudge MD, FACS Entered By: [name] on 07/31/2016 10:36:04 [name] ([id number]) -------------------------------------------------------------------------------- Physical Exam Details Patient Name: [name], [name] Date of Service: 07/31/2016  10:15 AM Medical Record Number: [id number] Patient Account Number: [id number] Date of Birth/Sex: 1946-01-03 (71 y.o. Male) Treating RN: [name] Primary Care Provider: Thomes Jackson Other Clinician: Referring Provider: Thomes Jackson Treating  Provider/Extender: [name] Weeks in Treatment: 4 Constitutional . Pulse regular. Respirations normal and unlabored. Afebrile. . Eyes Nonicteric. Reactive to light. Ears, Nose, Mouth, and Throat Lips, teeth, and gums WNL.[name] Moist mucosa without lesions. Neck supple and nontender. No palpable supraclavicular or cervical adenopathy. Normal sized without goiter. Respiratory WNL. No retractions.. Breath sounds WNL, No rubs, rales, rhonchi, or wheeze.. Cardiovascular Heart rhythm and rate regular, no murmur or gallop.. Pedal Pulses WNL. No clubbing, cyanosis or edema. Chest Breasts symmetical and no nipple discharge.. Breast tissue WNL, no masses, lumps, or tenderness.. Lymphatic No adneopathy. No adenopathy. No adenopathy. Musculoskeletal Adexa without tenderness or enlargement.. Digits and nails w/o clubbing, cyanosis, infection, petechiae, ischemia, or inflammatory conditions.. Integumentary (Hair, Skin) No suspicious lesions. No crepitus or fluctuance. No peri-wound warmth or erythema. No masses.[name] Psychiatric Judgement and insight Intact.. No evidence of depression, anxiety, or agitation.. Notes the wound is looking really good today and no mesh was protruding and no sharp debridement was required of the subcutaneous debris Electronic Signature(s) Signed: 07/31/2016 10:36:26 AM By: Michael Fudge MD, FACS Entered By: [name] on 07/31/2016 10:36:25 [name] ([id number]) -------------------------------------------------------------------------------- Physician Orders Details Patient Name: [name] Date of Service: 07/31/2016 10:15 AM Medical Record Number: [id number] Patient Account Number: [id number] Date of  Birth/Sex: 26-Apr-1945 (71 y.o. Male) Treating RN: [name] Primary Care Provider: Thomes Jackson Other Clinician: Referring Provider: Thomes Jackson Treating Provider/Extender: [name] in Treatment: 4 Verbal / Phone Orders: No Diagnosis Coding Wound Cleansing Wound #1 Right Abdomen - midline o Clean wound with Normal Saline. o May Shower, gently pat wound dry prior to applying new dressing. Wound #2 Left Abdomen - midline o Clean wound with Normal Saline. o May Shower, gently pat wound dry prior to applying new dressing. Anesthetic Wound #1 Right Abdomen - midline o Topical Lidocaine 4% cream applied to wound bed prior to debridement Wound #2 Left Abdomen - midline o Topical Lidocaine 4% cream applied to wound bed prior to debridement Skin Barriers/Peri-Wound Care Wound #1 Right Abdomen - midline o Skin Prep Wound #2 Left Abdomen - midline o Skin Prep Primary Wound Dressing Wound #1 Right Abdomen - midline o Hydrafera Blue Wound #2 Left Abdomen - midline o Hydrafera Blue Secondary Dressing Wound #1 Right Abdomen - midline o Boardered Foam Dressing Wound #2 Left Abdomen - midline o Boardered Foam Dressing [name], [name] ([id number]) Dressing Change Frequency Wound #1 Right Abdomen - midline o Change dressing every other day. Wound #2 Left Abdomen - midline o Change dressing every other day. Follow-up Appointments Wound #1 Right Abdomen - midline o Return Appointment in 1 week. Wound #2 Left Abdomen - midline o Return Appointment in 1 week. Additional Orders / Instructions Wound #1 Right Abdomen - midline o Other: - Go see a Engineer, drilling about your hernias and get a surgical opinion. Wound #2 Left Abdomen - midline o Other: - Go see a Engineer, drilling about your hernias and get a surgical opinion. Electronic Signature(s) Signed: 07/31/2016 4:05:08 PM By: Michael Fudge MD, FACS Signed: 07/31/2016 4:49:11 PM By: [name] Entered By: [name] on 07/31/2016 10:23:52 [name] ([id number]) -------------------------------------------------------------------------------- Problem List Details Patient Name: DREYDON, CARDENAS Date of Service: 07/31/2016 10:15 AM Medical Record Number: [id number] Patient Account Number: [id number] Date of Birth/Sex: 05/25/1945 (71 y.o. Male) Treating RN: [name] Primary Care Provider: Thomes Jackson Other Clinician: Referring Provider: Thomes Jackson Treating Provider/Extender: [name] in Treatment: 4 Active Problems ICD-10 Encounter Code  Description Active Date Diagnosis E11.622 Type 2 diabetes mellitus with other skin ulcer 07/03/2016 Yes S31.100A Unspecified open wound of abdominal wall, right upper 07/03/2016 Yes quadrant without penetration into peritoneal cavity, initial encounter S31.102A Unspecified open wound of abdominal wall, epigastric 07/03/2016 Yes region without penetration into peritoneal cavity, initial encounter F17.228 Nicotine dependence, chewing tobacco, with other 07/03/2016 Yes nicotine-induced disorders Inactive Problems Resolved Problems Electronic Signature(s) Signed: 07/31/2016 10:33:11 AM By: Michael Fudge MD, FACS Entered By: [name] on 07/31/2016 10:33:10 [name] ([id number]) -------------------------------------------------------------------------------- Progress Note Details Patient Name: [name] Date of Service: 07/31/2016 10:15 AM Medical Record Number: [id number] Patient Account Number: [id number] Date of Birth/Sex: [birth year] (71 y.o. Male) Treating RN: [name] Primary Care Provider: Thomes Jackson Other Clinician: Referring Provider: Thomes Jackson Treating Provider/Extender: [name] in Treatment: 4 Subjective Chief Complaint Information obtained from Patient Patients presents for treatment of an open diabetic ulcer to the anterior abdominal wall in the  epigastric and right upper quadrant which she's had for over 5 years History of Present Illness (HPI) The following HPI elements were documented for the patient's wound: Location: abdomen wall wounds,-- right upper quadrant and epigastric region Quality: Patient reports experiencing a dull pain to affected area(s). Severity: Patient states wound are getting worse. Duration: Patient has had the wound for >5 year's prior to seeking treatment at the wound center Timing: Pain in wound is Intermittent (comes and goes Context: The wound appeared gradually over time Modifying Factors: Consults to this date include:not seen a surgeon and only sees his PCP for his medical complaints Associated Signs and Symptoms: Patient reports having increase discharge. 71 year old gentleman has been seen recently by his PCP Dr. Thomes Jackson, who sees him with a history of diabetes mellitus, peripheral neuropathy, B12 deficiency, hypertension, osteoarthritis and the patient had recent blood sugar checked which was 163. Past medical history includes essential hypertension, incisional hernia, diabetes mellitus, colon polyps, COPD, tobacco abuse in the past, chewing tobacco, GERD. He has also been treated for chronic back pain with no sciatica and is on oxycodone. His last hemoglobin A1c was 6.9% His abdominal surgery was done at [facility] over 15 years ago and gradually there was sutures which protruded and then there was mesh which protruded. He washes this during his shower but does not put any dressing and the wound is covered with a lot of debris. He has never had a surgical consultation for this. 07/10/2016 -- the patient has spoken to his PCP who said he was going to call me but I have not yet had a phone conversation with the physician. From what I understand the physician was reluctant to have a surgical opinion because of the patient's several comorbidities and he feels that the surgery may be detrimental  to his overall health 07/24/2016 -- the patient's daughter is at the bedside and we've had a detailed discussion regarding his options for a surgical opinion and possible surgical intervention before this becomes a emergent situation. She will seek primary care input and take the father to [facility] for a surgical opinion. 07/31/2016 -- the patient's daughter has organized for a surgical opinion at [facility] on June 14 Picou, Billye Morrow. ([id number]) Objective Constitutional Pulse regular. Respirations normal and unlabored. Afebrile. Vitals Time Taken: 10:10 AM, Height: 65 in, Weight: 161 lbs, BMI: 26.8, Temperature: 97.8 F, Pulse: 98 bpm, Respiratory Rate: 18 breaths/min, Blood Pressure: 155/80 mmHg. Eyes Nonicteric. Reactive to light. Ears, Nose, Mouth, and Throat Lips,  teeth, and gums WNL.[name] Moist mucosa without lesions. Neck supple and nontender. No palpable supraclavicular or cervical adenopathy. Normal sized without goiter. Respiratory WNL. No retractions.. Breath sounds WNL, No rubs, rales, rhonchi, or wheeze.. Cardiovascular Heart rhythm and rate regular, no murmur or gallop.. Pedal Pulses WNL. No clubbing, cyanosis or edema. Chest Breasts symmetical and no nipple discharge.. Breast tissue WNL, no masses, lumps, or tenderness.. Lymphatic No adneopathy. No adenopathy. No adenopathy. Musculoskeletal Adexa without tenderness or enlargement.. Digits and nails w/o clubbing, cyanosis, infection, petechiae, ischemia, or inflammatory conditions.[name] Psychiatric Judgement and insight Intact.. No evidence of depression, anxiety, or agitation.. General Notes: the wound is looking really good today and no mesh was protruding and no sharp debridement was required of the subcutaneous debris Integumentary (Hair, Skin) No suspicious lesions. No crepitus or fluctuance. No peri-wound warmth or erythema. No masses.. Wound #1 status is Open. Original cause of wound was Gradually Appeared.  The wound is located on the Right Abdomen - midline. The wound measures 1.3cm length x 0.2cm width x 0.1cm depth; 0.204cm^2 area and 0.02cm^3 volume. The wound is limited to skin breakdown. There is no tunneling or undermining [name], [name]. ([id number]) noted. There is a medium amount of serous drainage noted. The wound margin is flat and intact. There is large (67-100%) red granulation within the wound bed. There is a small (1-33%) amount of necrotic tissue within the wound bed including Eschar. The periwound skin appearance did not exhibit: Callus, Crepitus, Excoriation, Induration, Rash, Scarring, Dry/Scaly, Maceration, Atrophie Blanche, Cyanosis, Ecchymosis, Hemosiderin Staining, Mottled, Pallor, Rubor, Erythema. Periwound temperature was noted as No Abnormality. General Notes: mesh showing through wound bed Wound #2 status is Open. Original cause of wound was Gradually Appeared. The wound is located on the Left Abdomen - midline. The wound measures 4cm length x 0.4cm width x 0.1cm depth; 1.257cm^2 area and 0.126cm^3 volume. The wound is limited to skin breakdown. There is no tunneling or undermining noted. There is a medium amount of serous drainage noted. The wound margin is flat and intact. There is large (67-100%) red granulation within the wound bed. There is a small (1-33%) amount of necrotic tissue within the wound bed including Eschar. The periwound skin appearance did not exhibit: Callus, Crepitus, Excoriation, Induration, Rash, Scarring, Dry/Scaly, Maceration, Atrophie Blanche, Cyanosis, Ecchymosis, Hemosiderin Staining, Mottled, Pallor, Rubor, Erythema. Periwound temperature was noted as No Abnormality. General Notes: mesh showing through wound bed Assessment Active Problems ICD-10 E11.622 - Type 2 diabetes mellitus with other skin ulcer S31.100A - Unspecified open wound of abdominal wall, right upper quadrant without penetration into peritoneal cavity, initial  encounter S31.102A - Unspecified open wound of abdominal wall, epigastric region without penetration into peritoneal cavity, initial encounter F17.228 - Nicotine dependence, chewing tobacco, with other nicotine-induced disorders Plan Wound Cleansing: Wound #1 Right Abdomen - midline: Clean wound with Normal Saline. May Shower, gently pat wound dry prior to applying new dressing. Wound #2 Left Abdomen - midline: Clean wound with Normal Saline. May Shower, gently pat wound dry prior to applying new dressing. Anesthetic: Wound #1 Right Abdomen - midline: [name], [name] ([id number]) Topical Lidocaine 4% cream applied to wound bed prior to debridement Wound #2 Left Abdomen - midline: Topical Lidocaine 4% cream applied to wound bed prior to debridement Skin Barriers/Peri-Wound Care: Wound #1 Right Abdomen - midline: Skin Prep Wound #2 Left Abdomen - midline: Skin Prep Primary Wound Dressing: Wound #1 Right Abdomen - midline: Hydrafera Blue Wound #2 Left Abdomen - midline: Hydrafera Blue Secondary  Dressing: Wound #1 Right Abdomen - midline: Boardered Foam Dressing Wound #2 Left Abdomen - midline: Boardered Foam Dressing Dressing Change Frequency: Wound #1 Right Abdomen - midline: Change dressing every other day. Wound #2 Left Abdomen - midline: Change dressing every other day. Follow-up Appointments: Wound #1 Right Abdomen - midline: Return Appointment in 1 week. Wound #2 Left Abdomen - midline: Return Appointment in 1 week. Additional Orders / Instructions: Wound #1 Right Abdomen - midline: Other: - Go see a Engineer, drilling about your hernias and get a surgical opinion. Wound #2 Left Abdomen - midline: Other: - Go see a Engineer, drilling about your hernias and get a surgical opinion. he has improved significantly since I first saw him and I have recommended: 1. Hydrofera blue and a bordered foam to be applied daily after washing with soap and water. 2. he has an appointment to see a  surgeon on June 14 and I am looking forward to this. 3. Good control of his diabetes mellitus 4. Adequate protein, vitamin A, vitamin C and zinc. Electronic Signature(s) Signed: 07/31/2016 10:38:26 AM By: Michael Fudge MD, FACS Entered By: [name] on 07/31/2016 10:38:26 [name], [name] ([id number]) [name], [name] ([id number]) -------------------------------------------------------------------------------- SuperBill Details Patient Name: [name], [name] Date of Service: 07/31/2016 Medical Record Number: [id number] Patient Account Number: [id number] Date of Birth/Sex: March 19, 1945 (71 y.o. Male) Treating RN: [name] Primary Care Provider: Thomes Jackson Other Clinician: Referring Provider: Thomes Jackson Treating Provider/Extender: [name] in Treatment: 4 Diagnosis Coding ICD-10 Codes Code Description [phone] Type 2 diabetes mellitus with other skin ulcer Unspecified open wound of abdominal wall, right upper quadrant without penetration into S31.100A peritoneal cavity, initial encounter Unspecified open wound of abdominal wall, epigastric region without penetration into S31.102A peritoneal cavity, initial encounter F17.228 Nicotine dependence, chewing tobacco, with other nicotine-induced disorders Facility Procedures CPT4 Code: 24825003 Description: 99213 - WOUND CARE VISIT-LEV 3 EST PT Modifier: Quantity: 1 Physician Procedures CPT4: Description Modifier Quantity Code [id number] 16945 - WC PHYS LEVEL 3 - EST PT 1 ICD-10 Description Diagnosis E11.622 Type 2 diabetes mellitus with other skin ulcer S31.100A Unspecified open wound of abdominal wall, right upper quadrant without  penetration into peritoneal cavity, initial encounter S31.102A Unspecified open wound of abdominal wall, epigastric region without penetration into peritoneal cavity, initial encounter Electronic Signature(s) Signed: 07/31/2016 11:08:09 AM By: [name] Signed: 07/31/2016 4:05:08 PM By:  Michael Fudge MD, FACS Previous Signature: 07/31/2016 10:38:43 AM Version By: Michael Fudge MD, FACS Entered By: [name] on 07/31/2016 11:08:09

## 2016-08-01 NOTE — Progress Notes (Signed)
MISTER, KRAHENBUHL ([id number]) Visit Report for 07/31/2016 Arrival Information Details Patient Name: [name], [name]. Date of Service: 07/31/2016 10:15 AM Medical Record Number: [id number] Patient Account Number: [id number] Date of Birth/Sex: [birth year] (71 y.o. Male) Treating RN: Montey Hora Primary Care Raeleen Winstanley: Thomes Lolling Other Clinician: Referring Kayelynn Abdou: Thomes Lolling Treating Carrel Leather/Extender: Frann Rider in Treatment: 4 Visit Information History Since Last Visit Added or deleted any medications: No Patient Arrived: Ambulatory Any new allergies or adverse reactions: No Arrival Time: 10:08 Had a fall or experienced change in No Accompanied By: self activities of daily living that may affect Transfer Assistance: None risk of falls: Patient Identification Verified: Yes Signs or symptoms of abuse/neglect since last No Secondary Verification Process Yes visito Completed: Hospitalized since last visit: No Patient Requires Transmission-Based No Has Dressing in Place as Prescribed: Yes Precautions: Pain Present Now: No Patient Has Alerts: Yes Patient Alerts: DMII Electronic Signature(s) Signed: 07/31/2016 4:49:11 PM By: Montey Hora Entered By: Montey Hora on 07/31/2016 10:09:13 [name] ([id number]) -------------------------------------------------------------------------------- Clinic Level of Care Assessment Details Patient Name: [name] Date of Service: 07/31/2016 10:15 AM Medical Record Number: [id number] Patient Account Number: [id number] Date of Birth/Sex: [birth year] (71 y.o. Male) Treating RN: Montey Hora Primary Care Shloimy Michalski: Thomes Lolling Other Clinician: Referring Jezebelle Ledwell: Thomes Lolling Treating Savreen Gebhardt/Extender: Frann Rider in Treatment: 4 Clinic Level of Care Assessment Items TOOL 4 Quantity Score '[]'$  - Use when only an EandM is performed on FOLLOW-UP visit 0 ASSESSMENTS - Nursing Assessment / Reassessment X -  Reassessment of Co-morbidities (includes updates in patient status) 1 10 X - Reassessment of Adherence to Treatment Plan 1 5 ASSESSMENTS - Wound and Skin Assessment / Reassessment '[]'$  - Simple Wound Assessment / Reassessment - one wound 0 X - Complex Wound Assessment / Reassessment - multiple wounds 2 5 '[]'$  - Dermatologic / Skin Assessment (not related to wound area) 0 ASSESSMENTS - Focused Assessment '[]'$  - Circumferential Edema Measurements - multi extremities 0 '[]'$  - Nutritional Assessment / Counseling / Intervention 0 '[]'$  - Lower Extremity Assessment (monofilament, tuning fork, pulses) 0 '[]'$  - Peripheral Arterial Disease Assessment (using hand held doppler) 0 ASSESSMENTS - Ostomy and/or Continence Assessment and Care '[]'$  - Incontinence Assessment and Management 0 '[]'$  - Ostomy Care Assessment and Management (repouching, etc.) 0 PROCESS - Coordination of Care X - Simple Patient / Family Education for ongoing care 1 15 '[]'$  - Complex (extensive) Patient / Family Education for ongoing care 0 '[]'$  - Staff obtains Programmer, systems, Records, Test Results / Process Orders 0 '[]'$  - Staff telephones HHA, Nursing Homes / Clarify orders / etc 0 '[]'$  - Routine Transfer to another Facility (non-emergent condition) 0 CASTLE, LAMONS ([id number]) '[]'$  - Routine Hospital Admission (non-emergent condition) 0 '[]'$  - New Admissions / Biomedical engineer / Ordering NPWT, Apligraf, etc. 0 '[]'$  - Emergency Hospital Admission (emergent condition) 0 X - Simple Discharge Coordination 1 10 '[]'$  - Complex (extensive) Discharge Coordination 0 PROCESS - Special Needs '[]'$  - Pediatric / Minor Patient Management 0 '[]'$  - Isolation Patient Management 0 '[]'$  - Hearing / Language / Visual special needs 0 '[]'$  - Assessment of Community assistance (transportation, D/C planning, etc.) 0 '[]'$  - Additional assistance / Altered mentation 0 '[]'$  - Support Surface(s) Assessment (bed, cushion, seat, etc.) 0 INTERVENTIONS - Wound Cleansing / Measurement '[]'$  -  Simple Wound Cleansing - one wound 0 X - Complex Wound Cleansing - multiple wounds 2 5 X - Wound Imaging (photographs - any number of wounds) 1 5 '[]'$  -  Wound Tracing (instead of photographs) 0 '[]'$  - Simple Wound Measurement - one wound 0 X - Complex Wound Measurement - multiple wounds 2 5 INTERVENTIONS - Wound Dressings X - Small Wound Dressing one or multiple wounds 2 10 '[]'$  - Medium Wound Dressing one or multiple wounds 0 '[]'$  - Large Wound Dressing one or multiple wounds 0 '[]'$  - Application of Medications - topical 0 '[]'$  - Application of Medications - injection 0 INTERVENTIONS - Miscellaneous '[]'$  - External ear exam 0 MURPHY, DUZAN ([id number]) '[]'$  - Specimen Collection (cultures, biopsies, blood, body fluids, etc.) 0 '[]'$  - Specimen(s) / Culture(s) sent or taken to Lab for analysis 0 '[]'$  - Patient Transfer (multiple staff / Harrel Lemon Lift / Similar devices) 0 '[]'$  - Simple Staple / Suture removal (25 or less) 0 '[]'$  - Complex Staple / Suture removal (26 or more) 0 '[]'$  - Hypo / Hyperglycemic Management (close monitor of Blood Glucose) 0 '[]'$  - Ankle / Brachial Index (ABI) - do not check if billed separately 0 X - Vital Signs 1 5 Has the patient been seen at the hospital within the last three years: Yes Total Score: 100 Level Of Care: New/Established - Level 3 Electronic Signature(s) Signed: 07/31/2016 4:49:11 PM By: Montey Hora Entered By: Montey Hora on 07/31/2016 11:08:01 [name] ([id number]) -------------------------------------------------------------------------------- Encounter Discharge Information Details Patient Name: [name] Date of Service: 07/31/2016 10:15 AM Medical Record Number: [id number] Patient Account Number: [id number] Date of Birth/Sex: 12/10/1945 (71 y.o. Male) Treating RN: Montey Hora Primary Care Illana Nolting: Thomes Lolling Other Clinician: Referring Quanesha Klimaszewski: Thomes Lolling Treating Annissa Andreoni/Extender: Frann Rider in Treatment: 4 Encounter  Discharge Information Items Discharge Pain Level: 0 Discharge Condition: Stable Ambulatory Status: Ambulatory Discharge Destination: Home Transportation: Private Auto Accompanied By: self Schedule Follow-up Appointment: Yes Medication Reconciliation completed No and provided to Patient/Care Graycee Greeson: Patient Clinical Summary of Care: Declined Electronic Signature(s) Signed: 07/31/2016 11:09:02 AM By: Montey Hora Previous Signature: 07/31/2016 10:30:34 AM Version By: Sharon Mt Entered By: Montey Hora on 07/31/2016 11:09:02 [name] ([id number]) -------------------------------------------------------------------------------- Multi Wound Chart Details Patient Name: [name] Date of Service: 07/31/2016 10:15 AM Medical Record Number: [id number] Patient Account Number: [id number] Date of Birth/Sex: 07/17/1945 (71 y.o. Male) Treating RN: Montey Hora Primary Care Brevon Dewald: Thomes Lolling Other Clinician: Referring Bracy Pepper: Thomes Lolling Treating Asim Gersten/Extender: Frann Rider in Treatment: 4 Vital Signs Height(in): 65 Pulse(bpm): 98 Weight(lbs): 161 Blood Pressure 155/80 (mmHg): Body Mass Index(BMI): 27 Temperature(F): 97.8 Respiratory Rate 18 (breaths/min): Photos: [1:No Photos] [2:No Photos] [N/A:N/A] Wound Location: [1:Right Abdomen - midline] [2:Left Abdomen - midline] [N/A:N/A] Wounding Event: [1:Gradually Appeared] [2:Gradually Appeared] [N/A:N/A] Primary Etiology: [1:Atypical] [2:Atypical] [N/A:N/A] Comorbid History: [1:Cataracts, Chronic Obstructive Pulmonary Disease (COPD), Hypertension, Type II Diabetes, Osteoarthritis, Neuropathy] [2:Cataracts, Chronic Obstructive Pulmonary Disease (COPD), Hypertension, Type II Diabetes, Osteoarthritis,  Neuropathy] [N/A:N/A] Date Acquired: [1:05/17/2015] [2:05/17/2015] [N/A:N/A] Weeks of Treatment: [1:4] [2:4] [N/A:N/A] Wound Status: [1:Open] [2:Open] [N/A:N/A] Measurements L x W x D 1.3x0.2x0.1  [2:4x0.4x0.1] [N/A:N/A] (cm) Area (cm) : [1:0.204] [2:1.257] [N/A:N/A] Volume (cm) : [1:0.02] [2:0.126] [N/A:N/A] % Reduction in Area: [1:94.10%] [2:76.20%] [N/A:N/A] % Reduction in Volume: 94.20% [2:76.10%] [N/A:N/A] Classification: [1:Full Thickness Without Exposed Support Structures] [2:Full Thickness Without Exposed Support Structures] [N/A:N/A] Exudate Amount: [1:Medium] [2:Medium] [N/A:N/A] Exudate Type: [1:Serous] [2:Serous] [N/A:N/A] Exudate Color: [1:amber] [2:amber] [N/A:N/A] Wound Margin: [1:Flat and Intact] [2:Flat and Intact] [N/A:N/A] Granulation Amount: [1:Large (67-100%)] [2:Large (67-100%)] [N/A:N/A] Granulation Quality: [1:Red] [2:Red] [N/A:N/A] Necrotic Amount: [1:Small (1-33%)] [2:Small (1-33%)] [N/A:N/A] Necrotic Tissue: [1:Eschar] [2:Eschar] [N/A:N/A] Exposed  Structures: Fascia: No Fascia: No N/A Fat Layer (Subcutaneous Fat Layer (Subcutaneous Tissue) Exposed: No Tissue) Exposed: No Tendon: No Tendon: No Muscle: No Muscle: No Joint: No Joint: No Bone: No Bone: No Limited to Skin Limited to Skin Breakdown Breakdown Epithelialization: Small (1-33%) Small (1-33%) N/A Periwound Skin Texture: Excoriation: No Excoriation: No N/A Induration: No Induration: No Callus: No Callus: No Crepitus: No Crepitus: No Rash: No Rash: No Scarring: No Scarring: No Periwound Skin Maceration: No Maceration: No N/A Moisture: Dry/Scaly: No Dry/Scaly: No Periwound Skin Color: Atrophie Blanche: No Atrophie Blanche: No N/A Cyanosis: No Cyanosis: No Ecchymosis: No Ecchymosis: No Erythema: No Erythema: No Hemosiderin Staining: No Hemosiderin Staining: No Mottled: No Mottled: No Pallor: No Pallor: No Rubor: No Rubor: No Temperature: No Abnormality No Abnormality N/A Tenderness on No No N/A Palpation: Wound Preparation: Ulcer Cleansing: Ulcer Cleansing: N/A Rinsed/Irrigated with Rinsed/Irrigated with Saline Saline Topical Anesthetic Topical  Anesthetic Applied: Other: lidocaine Applied: Other: lidocaine 4% 4% Assessment Notes: mesh showing through mesh showing through N/A wound bed wound bed Treatment Notes Electronic Signature(s) Signed: 07/31/2016 10:33:19 AM By: Christin Fudge MD, FACS Entered By: Christin Fudge on 07/31/2016 10:33:18 [name] ([id number]) -------------------------------------------------------------------------------- [place] Details Patient Name: KYE, HEDDEN. Date of Service: 07/31/2016 10:15 AM Medical Record Number: [id number] Patient Account Number: [id number] Date of Birth/Sex: [birth year] (71 y.o. Male) Treating RN: Montey Hora Primary Care Yasiel Goyne: Thomes Lolling Other Clinician: Referring Delwyn Scoggin: Thomes Lolling Treating Davonne Baby/Extender: Frann Rider in Treatment: 4 Active Inactive ` Abuse / Safety / Falls / Self Care Management Nursing Diagnoses: Impaired physical mobility Goals: Patient will remain injury free Date Initiated: 07/03/2016 Target Resolution Date: 09/14/2016 Goal Status: Active Interventions: Assess fall risk on admission and as needed Notes: ` Orientation to the Wound Care Program Nursing Diagnoses: Knowledge deficit related to the wound healing center program Goals: Patient/caregiver will verbalize understanding of the [facility] Program Date Initiated: 07/03/2016 Target Resolution Date: 09/15/2016 Goal Status: Active Interventions: Provide education on orientation to the wound center Notes: ` Wound/Skin Impairment Nursing Diagnoses: Impaired tissue integrity ABISAI, DEER ([id number]) Goals: Patient/caregiver will verbalize understanding of skin care regimen Date Initiated: 07/03/2016 Target Resolution Date: 09/15/2016 Goal Status: Active Ulcer/skin breakdown will have a volume reduction of 30% by week 4 Date Initiated: 07/03/2016 Target Resolution Date: 09/15/2016 Goal Status: Active Ulcer/skin breakdown will  have a volume reduction of 50% by week 8 Date Initiated: 07/03/2016 Target Resolution Date: 09/15/2016 Goal Status: Active Ulcer/skin breakdown will have a volume reduction of 80% by week 12 Date Initiated: 07/03/2016 Target Resolution Date: 09/15/2016 Goal Status: Active Ulcer/skin breakdown will heal within 14 weeks Date Initiated: 07/03/2016 Target Resolution Date: 09/15/2016 Goal Status: Active Interventions: Assess patient/caregiver ability to obtain necessary supplies Assess patient/caregiver ability to perform ulcer/skin care regimen upon admission and as needed Assess ulceration(s) every visit Notes: Electronic Signature(s) Signed: 07/31/2016 4:49:11 PM By: Montey Hora Entered By: Montey Hora on 07/31/2016 10:22:55 [name] ([id number]) -------------------------------------------------------------------------------- Pain Assessment Details Patient Name: [name] Date of Service: 07/31/2016 10:15 AM Medical Record Number: [id number] Patient Account Number: [id number] Date of Birth/Sex: [birth year] (71 y.o. Male) Treating RN: Montey Hora Primary Care Naksh Radi: Thomes Lolling Other Clinician: Referring Jadasia Haws: Thomes Lolling Treating Dastan Krider/Extender: Frann Rider in Treatment: 4 Active Problems Location of Pain Severity and Description of Pain Patient Has Paino No Site Locations Pain Management and Medication Current Pain Management: Notes Topical or injectable lidocaine is offered to patient for acute pain when surgical  debridement is performed. If needed, Patient is instructed to use over the counter pain medication for the following 24-48 hours after debridement. Wound care MDs do not prescribed pain medications. Patient has chronic pain or uncontrolled pain. Patient has been instructed to make an appointment with their Primary Care Physician for pain management. Electronic Signature(s) Signed: 07/31/2016 4:49:11 PM By: Montey Hora Entered  By: Montey Hora on 07/31/2016 10:10:44 [name] ([id number]) -------------------------------------------------------------------------------- Patient/Caregiver Education Details Patient Name: MEKHI, SONN Date of Service: 07/31/2016 10:15 AM Medical Record Number: [id number] Patient Account Number: [id number] Date of Birth/Gender: 17-May-1945 (71 y.o. Male) Treating RN: Montey Hora Primary Care Physician: Thomes Lolling Other Clinician: Referring Physician: Thomes Lolling Treating Physician/Extender: Frann Rider in Treatment: 4 Education Assessment Education Provided To: Patient Education Topics Provided Wound/Skin Impairment: Handouts: Other: wound care as ordered Methods: Explain/Verbal Responses: State content correctly Electronic Signature(s) Signed: 07/31/2016 4:49:11 PM By: Montey Hora Entered By: Montey Hora on 07/31/2016 11:10:00 [name] ([id number]) -------------------------------------------------------------------------------- Wound Assessment Details Patient Name: [name] Date of Service: 07/31/2016 10:15 AM Medical Record Number: [id number] Patient Account Number: [id number] Date of Birth/Sex: Feb 02, 1946 (71 y.o. Male) Treating RN: Montey Hora Primary Care Cato Liburd: Thomes Lolling Other Clinician: Referring Madalyne Husk: Thomes Lolling Treating Sheilah Rayos/Extender: Frann Rider in Treatment: 4 Wound Status Wound Number: 1 Primary Atypical Etiology: Wound Location: Right Abdomen - midline Wound Open Wounding Event: Gradually Appeared Status: Date Acquired: 05/17/2015 Comorbid Cataracts, Chronic Obstructive Weeks Of Treatment: 4 History: Pulmonary Disease (COPD), Clustered Wound: No Hypertension, Type II Diabetes, Osteoarthritis, Neuropathy Photos Photo Uploaded By: Montey Hora on 07/31/2016 12:28:56 Wound Measurements Length: (cm) 1.3 Width: (cm) 0.2 Depth: (cm) 0.1 Area: (cm) 0.204 Volume: (cm) 0.02 %  Reduction in Area: 94.1% % Reduction in Volume: 94.2% Epithelialization: Small (1-33%) Tunneling: No Undermining: No Wound Description Full Thickness Without Exposed Foul Odor Afte Classification: Support Structures Slough/Fibrino Wound Margin: Flat and Intact Exudate Medium Amount: Exudate Type: Serous Exudate Color: amber r Cleansing: No No Wound Bed Granulation Amount: Large (67-100%) Exposed Structure AMOS, MICHEALS ([id number]) Granulation Quality: Red Fascia Exposed: No Necrotic Amount: Small (1-33%) Fat Layer (Subcutaneous Tissue) Exposed: No Necrotic Quality: Eschar Tendon Exposed: No Muscle Exposed: No Joint Exposed: No Bone Exposed: No Limited to Skin Breakdown Periwound Skin Texture Texture Color No Abnormalities Noted: No No Abnormalities Noted: No Callus: No Atrophie Blanche: No Crepitus: No Cyanosis: No Excoriation: No Ecchymosis: No Induration: No Erythema: No Rash: No Hemosiderin Staining: No Scarring: No Mottled: No Pallor: No Moisture Rubor: No No Abnormalities Noted: No Dry / Scaly: No Temperature / Pain Maceration: No Temperature: No Abnormality Wound Preparation Ulcer Cleansing: Rinsed/Irrigated with Saline Topical Anesthetic Applied: Other: lidocaine 4%, Assessment Notes mesh showing through wound bed Treatment Notes Wound #1 (Right Abdomen - midline) 1. Cleansed with: Clean wound with Normal Saline 2. Anesthetic Topical Lidocaine 4% cream to wound bed prior to debridement 3. Peri-wound Care: Skin Prep 4. Dressing Applied: Hydrafera Blue 5. Secondary Dressing Applied Bordered Foam Dressing Electronic Signature(s) Signed: 07/31/2016 4:49:11 PM By: Montey Hora Entered By: Montey Hora on 07/31/2016 10:22:13 THEADOR, JEZEWSKI ([id number]) -------------------------------------------------------------------------------- Wound Assessment Details Patient Name: [name] Date of Service: 07/31/2016 10:15 AM Medical  Record Number: [id number] Patient Account Number: [id number] Date of Birth/Sex: 07/17/1945 (71 y.o. Male) Treating RN: Montey Hora Primary Care Elowyn Raupp: Thomes Lolling Other Clinician: Referring Mariaceleste Herrera: Thomes Lolling Treating Keiarah Orlowski/Extender: Frann Rider in Treatment: 4 Wound Status Wound Number: 2 Primary Atypical Etiology: Wound Location: Left  Abdomen - midline Wound Open Wounding Event: Gradually Appeared Status: Date Acquired: 05/17/2015 Comorbid Cataracts, Chronic Obstructive Weeks Of Treatment: 4 History: Pulmonary Disease (COPD), Clustered Wound: No Hypertension, Type II Diabetes, Osteoarthritis, Neuropathy Photos Photo Uploaded By: Montey Hora on 07/31/2016 12:28:57 Wound Measurements Length: (cm) 4 Width: (cm) 0.4 Depth: (cm) 0.1 Area: (cm) 1.257 Volume: (cm) 0.126 % Reduction in Area: 76.2% % Reduction in Volume: 76.1% Epithelialization: Small (1-33%) Tunneling: No Undermining: No Wound Description Full Thickness Without Exposed Foul Odor Afte Classification: Support Structures Slough/Fibrino Wound Margin: Flat and Intact Exudate Medium Amount: Exudate Type: Serous Exudate Color: amber r Cleansing: No No Wound Bed Granulation Amount: Large (67-100%) Exposed Structure KAIRE, STARY ([id number]) Granulation Quality: Red Fascia Exposed: No Necrotic Amount: Small (1-33%) Fat Layer (Subcutaneous Tissue) Exposed: No Necrotic Quality: Eschar Tendon Exposed: No Muscle Exposed: No Joint Exposed: No Bone Exposed: No Limited to Skin Breakdown Periwound Skin Texture Texture Color No Abnormalities Noted: No No Abnormalities Noted: No Callus: No Atrophie Blanche: No Crepitus: No Cyanosis: No Excoriation: No Ecchymosis: No Induration: No Erythema: No Rash: No Hemosiderin Staining: No Scarring: No Mottled: No Pallor: No Moisture Rubor: No No Abnormalities Noted: No Dry / Scaly: No Temperature / Pain Maceration:  No Temperature: No Abnormality Wound Preparation Ulcer Cleansing: Rinsed/Irrigated with Saline Topical Anesthetic Applied: Other: lidocaine 4%, Assessment Notes mesh showing through wound bed Treatment Notes Wound #2 (Left Abdomen - midline) 1. Cleansed with: Clean wound with Normal Saline 2. Anesthetic Topical Lidocaine 4% cream to wound bed prior to debridement 3. Peri-wound Care: Skin Prep 4. Dressing Applied: Hydrafera Blue 5. Secondary Dressing Applied Bordered Foam Dressing Electronic Signature(s) Signed: 07/31/2016 4:49:11 PM By: Montey Hora Entered By: Montey Hora on 07/31/2016 10:22:47 [name] ([id number]) -------------------------------------------------------------------------------- [place] Details Patient Name: [name] Date of Service: 07/31/2016 10:15 AM Medical Record Number: [id number] Patient Account Number: [id number] Date of Birth/Sex: 1945-05-30 (71 y.o. Male) Treating RN: Montey Hora Primary Care Daylin Eads: Thomes Lolling Other Clinician: Referring Kaytlan Behrman: Thomes Lolling Treating Pace Lamadrid/Extender: Frann Rider in Treatment: 4 Vital Signs Time Taken: 10:10 Temperature (F): 97.8 Height (in): 65 Pulse (bpm): 98 Weight (lbs): 161 Respiratory Rate (breaths/min): 18 Body Mass Index (BMI): 26.8 Blood Pressure (mmHg): 155/80 Reference Range: 80 - 120 mg / dl Electronic Signature(s) Signed: 07/31/2016 4:49:11 PM By: Montey Hora Entered By: Montey Hora on 07/31/2016 10:11:02

## 2016-08-10 ENCOUNTER — Encounter: Payer: Medicare Other | Admitting: Surgery

## 2016-08-10 DIAGNOSIS — E11622 Type 2 diabetes mellitus with other skin ulcer: Secondary | ICD-10-CM | POA: Diagnosis not present

## 2016-08-12 NOTE — Progress Notes (Signed)
[name] ([id number]) Visit Report for 08/10/2016 Chief Complaint Document Details Patient Name: [name], [name]. Date of Service: 08/10/2016 10:15 AM Medical Record Number: [id number] Patient Account Number: [id number] Date of Birth/Sex: Jun 10, 1945 (71 y.o. Male) Treating RN: Montey Hora Primary Care Provider: Thomes Lolling Other Clinician: Referring Provider: Thomes Lolling Treating Provider/Extender: Frann Rider in Treatment: 5 Information Obtained from: Patient Chief Complaint Patients presents for treatment of an open diabetic ulcer to the anterior abdominal wall in the epigastric and right upper quadrant which she's had for over 5 years Electronic Signature(s) Signed: 08/10/2016 11:22:35 AM By: Christin Fudge MD, FACS Entered By: Christin Fudge on 08/10/2016 11:22:35 [name] ([id number]) -------------------------------------------------------------------------------- HPI Details Patient Name: [name] Date of Service: 08/10/2016 10:15 AM Medical Record Number: [id number] Patient Account Number: [id number] Date of Birth/Sex: May 13, 1945 (71 y.o. Male) Treating RN: Montey Hora Primary Care Provider: Thomes Lolling Other Clinician: Referring Provider: Thomes Lolling Treating Provider/Extender: Frann Rider in Treatment: 5 History of Present Illness Location: abdomen wall wounds,-- right upper quadrant and epigastric region Quality: Patient reports experiencing a dull pain to affected area(s). Severity: Patient states wound are getting worse. Duration: Patient has had the wound for >5 year's prior to seeking treatment at the wound center Timing: Pain in wound is Intermittent (comes and goes Context: The wound appeared gradually over time Modifying Factors: Consults to this date include:not seen a surgeon and only sees his PCP for his medical complaints Associated Signs and Symptoms: Patient reports having increase discharge. HPI Description:  71 year old gentleman has been seen recently by his PCP Dr. Thomes Lolling, who sees him with a history of diabetes mellitus, peripheral neuropathy, B12 deficiency, hypertension, osteoarthritis and the patient had recent blood sugar checked which was 163. Past medical history includes essential hypertension, incisional hernia, diabetes mellitus, colon polyps, COPD, tobacco abuse in the past, chewing tobacco, GERD. He has also been treated for chronic back pain with no sciatica and is on oxycodone. His last hemoglobin A1c was 6.9% His abdominal surgery was done at [facility] over 15 years ago and gradually there was sutures which protruded and then there was mesh which protruded. He washes this during his shower but does not put any dressing and the wound is covered with a lot of debris. He has never had a surgical consultation for this. 07/10/2016 -- the patient has spoken to his PCP who said he was going to call me but I have not yet had a phone conversation with the physician. From what I understand the physician was reluctant to have a surgical opinion because of the patient's several comorbidities and he feels that the surgery may be detrimental to his overall health 07/24/2016 -- the patient's daughter is at the bedside and we've had a detailed discussion regarding his options for a surgical opinion and possible surgical intervention before this becomes a emergent situation. She will seek primary care input and take the father to [facility] for a surgical opinion. 07/31/2016 -- the patient's daughter has organized for a surgical opinion at [facility] on June 14 Electronic Signature(s) Signed: 08/10/2016 11:22:39 AM By: Christin Fudge MD, FACS Entered By: Christin Fudge on 08/10/2016 11:22:39 [name] ([id number]) -------------------------------------------------------------------------------- Physical Exam Details Patient Name: [name], [name] Date of Service: 08/10/2016  10:15 AM Medical Record Number: [id number] Patient Account Number: [id number] Date of Birth/Sex: 1945-05-24 (71 y.o. Male) Treating RN: Montey Hora Primary Care Provider: Thomes Lolling Other Clinician: Referring Provider: Thomes Lolling Treating  Provider/Extender: Christin Fudge Weeks in Treatment: 5 Constitutional . Pulse regular. Respirations normal and unlabored. Afebrile. . Eyes Nonicteric. Reactive to light. Ears, Nose, Mouth, and Throat Lips, teeth, and gums WNL.[name] Moist mucosa without lesions. Neck supple and nontender. No palpable supraclavicular or cervical adenopathy. Normal sized without goiter. Respiratory WNL. No retractions.. Cardiovascular Pedal Pulses WNL. No clubbing, cyanosis or edema. Lymphatic No adneopathy. No adenopathy. No adenopathy. Musculoskeletal Adexa without tenderness or enlargement.. Digits and nails w/o clubbing, cyanosis, infection, petechiae, ischemia, or inflammatory conditions.. Integumentary (Hair, Skin) No suspicious lesions. No crepitus or fluctuance. No peri-wound warmth or erythema. No masses.[name] Psychiatric Judgement and insight Intact.. No evidence of depression, anxiety, or agitation.. Notes both the wounds are looking very clean and there is no protruding mesh which needs any debridement. Overall the size has gone down remarkably Electronic Signature(s) Signed: 08/10/2016 11:23:47 AM By: Christin Fudge MD, FACS Entered By: Christin Fudge on 08/10/2016 11:23:46 [name] ([id number]) -------------------------------------------------------------------------------- Physician Orders Details Patient Name: [name] Date of Service: 08/10/2016 10:15 AM Medical Record Number: [id number] Patient Account Number: [id number] Date of Birth/Sex: 1946-02-16 (71 y.o. Male) Treating RN: Montey Hora Primary Care Provider: Thomes Lolling Other Clinician: Referring Provider: Thomes Lolling Treating Provider/Extender: Frann Rider in  Treatment: 5 Verbal / Phone Orders: No Diagnosis Coding Wound Cleansing Wound #1 Right Abdomen - midline o Clean wound with Normal Saline. o May Shower, gently pat wound dry prior to applying new dressing. Wound #2 Left Abdomen - midline o Clean wound with Normal Saline. o May Shower, gently pat wound dry prior to applying new dressing. Anesthetic Wound #1 Right Abdomen - midline o Topical Lidocaine 4% cream applied to wound bed prior to debridement Wound #2 Left Abdomen - midline o Topical Lidocaine 4% cream applied to wound bed prior to debridement Skin Barriers/Peri-Wound Care Wound #1 Right Abdomen - midline o Skin Prep Wound #2 Left Abdomen - midline o Skin Prep Primary Wound Dressing Wound #1 Right Abdomen - midline o Hydrafera Blue Wound #2 Left Abdomen - midline o Hydrafera Blue Secondary Dressing Wound #1 Right Abdomen - midline o Boardered Foam Dressing Wound #2 Left Abdomen - midline o Boardered Foam Dressing [name], [name] ([id number]) Dressing Change Frequency Wound #1 Right Abdomen - midline o Change dressing every other day. Wound #2 Left Abdomen - midline o Change dressing every other day. Follow-up Appointments Wound #1 Right Abdomen - midline o Return Appointment in 1 week. Wound #2 Left Abdomen - midline o Return Appointment in 1 week. Additional Orders / Instructions Wound #1 Right Abdomen - midline o Other: - Go see a Engineer, drilling about your hernias and get a surgical opinion. Wound #2 Left Abdomen - midline o Other: - Go see a Engineer, drilling about your hernias and get a surgical opinion. Electronic Signature(s) Signed: 08/10/2016 4:34:04 PM By: Christin Fudge MD, FACS Signed: 08/10/2016 4:52:26 PM By: Montey Hora Entered By: Montey Hora on 08/10/2016 10:32:55 [name] ([id number]) -------------------------------------------------------------------------------- Problem List Details Patient Name: [name], [name] Date of Service: 08/10/2016 10:15 AM Medical Record Number: [id number] Patient Account Number: [id number] Date of Birth/Sex: [birth year] (71 y.o. Male) Treating RN: Montey Hora Primary Care Provider: Thomes Lolling Other Clinician: Referring Provider: Thomes Lolling Treating Provider/Extender: Frann Rider in Treatment: 5 Active Problems ICD-10 Encounter Code Description Active Date Diagnosis E11.622 Type 2 diabetes mellitus with other skin ulcer 07/03/2016 Yes S31.100A Unspecified open wound of abdominal wall, right upper 07/03/2016 Yes quadrant without penetration into peritoneal  cavity, initial encounter S31.102A Unspecified open wound of abdominal wall, epigastric 07/03/2016 Yes region without penetration into peritoneal cavity, initial encounter F17.228 Nicotine dependence, chewing tobacco, with other 07/03/2016 Yes nicotine-induced disorders Inactive Problems Resolved Problems Electronic Signature(s) Signed: 08/10/2016 11:22:23 AM By: Christin Fudge MD, FACS Entered By: Christin Fudge on 08/10/2016 11:22:23 [name] ([id number]) -------------------------------------------------------------------------------- Progress Note Details Patient Name: [name] Date of Service: 08/10/2016 10:15 AM Medical Record Number: [id number] Patient Account Number: [id number] Date of Birth/Sex: Oct 02, 1945 (71 y.o. Male) Treating RN: Montey Hora Primary Care Provider: Thomes Lolling Other Clinician: Referring Provider: Thomes Lolling Treating Provider/Extender: Frann Rider in Treatment: 5 Subjective Chief Complaint Information obtained from Patient Patients presents for treatment of an open diabetic ulcer to the anterior abdominal wall in the epigastric and right upper quadrant which she's had for over 5 years History of Present Illness (HPI) The following HPI elements were documented for the patient's wound: Location: abdomen wall wounds,-- right upper  quadrant and epigastric region Quality: Patient reports experiencing a dull pain to affected area(s). Severity: Patient states wound are getting worse. Duration: Patient has had the wound for >5 year's prior to seeking treatment at the wound center Timing: Pain in wound is Intermittent (comes and goes Context: The wound appeared gradually over time Modifying Factors: Consults to this date include:not seen a surgeon and only sees his PCP for his medical complaints Associated Signs and Symptoms: Patient reports having increase discharge. 71 year old gentleman has been seen recently by his PCP Dr. Thomes Lolling, who sees him with a history of diabetes mellitus, peripheral neuropathy, B12 deficiency, hypertension, osteoarthritis and the patient had recent blood sugar checked which was 163. Past medical history includes essential hypertension, incisional hernia, diabetes mellitus, colon polyps, COPD, tobacco abuse in the past, chewing tobacco, GERD. He has also been treated for chronic back pain with no sciatica and is on oxycodone. His last hemoglobin A1c was 6.9% His abdominal surgery was done at [facility] over 15 years ago and gradually there was sutures which protruded and then there was mesh which protruded. He washes this during his shower but does not put any dressing and the wound is covered with a lot of debris. He has never had a surgical consultation for this. 07/10/2016 -- the patient has spoken to his PCP who said he was going to call me but I have not yet had a phone conversation with the physician. From what I understand the physician was reluctant to have a surgical opinion because of the patient's several comorbidities and he feels that the surgery may be detrimental to his overall health 07/24/2016 -- the patient's daughter is at the bedside and we've had a detailed discussion regarding his options for a surgical opinion and possible surgical intervention before this becomes a  emergent situation. She will seek primary care input and take the father to [facility] for a surgical opinion. 07/31/2016 -- the patient's daughter has organized for a surgical opinion at [facility] on June 14 [name], Michael [place]. ([id number]) Objective Constitutional Pulse regular. Respirations normal and unlabored. Afebrile. Vitals Time Taken: 10:12 AM, Height: 65 in, Weight: 161 lbs, BMI: 26.8, Temperature: 98.1 F, Pulse: 75 bpm, Respiratory Rate: 18 breaths/min, Blood Pressure: 145/77 mmHg. Eyes Nonicteric. Reactive to light. Ears, Nose, Mouth, and Throat Lips, teeth, and gums WNL.[name] Moist mucosa without lesions. Neck supple and nontender. No palpable supraclavicular or cervical adenopathy. Normal sized without goiter. Respiratory WNL. No retractions.. Cardiovascular Pedal Pulses WNL. No  clubbing, cyanosis or edema. Lymphatic No adneopathy. No adenopathy. No adenopathy. Musculoskeletal Adexa without tenderness or enlargement.. Digits and nails w/o clubbing, cyanosis, infection, petechiae, ischemia, or inflammatory conditions.[name] Psychiatric Judgement and insight Intact.. No evidence of depression, anxiety, or agitation.. General Notes: both the wounds are looking very clean and there is no protruding mesh which needs any debridement. Overall the size has gone down remarkably Integumentary (Hair, Skin) No suspicious lesions. No crepitus or fluctuance. No peri-wound warmth or erythema. No masses.. Wound #1 status is Open. Original cause of wound was Gradually Appeared. The wound is located on the Right Abdomen - midline. The wound measures 1.3cm length x 0.2cm width x 0.1cm depth; 0.204cm^2 area and 0.02cm^3 volume. The wound is limited to skin breakdown. There is no tunneling or undermining noted. There is a medium amount of serous drainage noted. The wound margin is flat and intact. There is medium (34-66%) red granulation within the wound bed. There is a medium (34-66%)  amount of necrotic tissue within the wound bed including Eschar. The periwound skin appearance did not exhibit: Callus, [name], [name]. ([id number]) Crepitus, Excoriation, Induration, Rash, Scarring, Dry/Scaly, Maceration, Atrophie Blanche, Cyanosis, Ecchymosis, Hemosiderin Staining, Mottled, Pallor, Rubor, Erythema. Periwound temperature was noted as No Abnormality. Wound #2 status is Open. Original cause of wound was Gradually Appeared. The wound is located on the Left Abdomen - midline. The wound measures 4cm length x 0.4cm width x 0.1cm depth; 1.257cm^2 area and 0.126cm^3 volume. The wound is limited to skin breakdown. There is no tunneling or undermining noted. There is a medium amount of serous drainage noted. The wound margin is flat and intact. There is large (67-100%) red granulation within the wound bed. There is a small (1-33%) amount of necrotic tissue within the wound bed including Eschar. The periwound skin appearance did not exhibit: Callus, Crepitus, Excoriation, Induration, Rash, Scarring, Dry/Scaly, Maceration, Atrophie Blanche, Cyanosis, Ecchymosis, Hemosiderin Staining, Mottled, Pallor, Rubor, Erythema. Periwound temperature was noted as No Abnormality. Assessment Active Problems ICD-10 E11.622 - Type 2 diabetes mellitus with other skin ulcer S31.100A - Unspecified open wound of abdominal wall, right upper quadrant without penetration into peritoneal cavity, initial encounter S31.102A - Unspecified open wound of abdominal wall, epigastric region without penetration into peritoneal cavity, initial encounter F17.228 - Nicotine dependence, chewing tobacco, with other nicotine-induced disorders Plan Wound Cleansing: Wound #1 Right Abdomen - midline: Clean wound with Normal Saline. May Shower, gently pat wound dry prior to applying new dressing. Wound #2 Left Abdomen - midline: Clean wound with Normal Saline. May Shower, gently pat wound dry prior to applying new  dressing. Anesthetic: Wound #1 Right Abdomen - midline: Topical Lidocaine 4% cream applied to wound bed prior to debridement Wound #2 Left Abdomen - midline: Topical Lidocaine 4% cream applied to wound bed prior to debridement Skin Barriers/Peri-Wound Care: Wound #1 Right Abdomen - midline: [name], [name] ([id number]) Skin Prep Wound #2 Left Abdomen - midline: Skin Prep Primary Wound Dressing: Wound #1 Right Abdomen - midline: Hydrafera Blue Wound #2 Left Abdomen - midline: Hydrafera Blue Secondary Dressing: Wound #1 Right Abdomen - midline: Boardered Foam Dressing Wound #2 Left Abdomen - midline: Boardered Foam Dressing Dressing Change Frequency: Wound #1 Right Abdomen - midline: Change dressing every other day. Wound #2 Left Abdomen - midline: Change dressing every other day. Follow-up Appointments: Wound #1 Right Abdomen - midline: Return Appointment in 1 week. Wound #2 Left Abdomen - midline: Return Appointment in 1 week. Additional Orders / Instructions: Wound #1 Right Abdomen -  midline: Other: - Go see a Engineer, drilling about your hernias and get a surgical opinion. Wound #2 Left Abdomen - midline: Other: - Go see a Engineer, drilling about your hernias and get a surgical opinion. There has been much improvement in his wounds on the abdomen and I have recommended: 1. Hydrofera blue and a bordered foam to be applied daily after washing with soap and water. 2. he has an appointment to see a surgeon on June 14 and I am looking forward to this. 3. Good control of his diabetes mellitus 4. Adequate protein, vitamin A, vitamin C and zinc. Electronic Signature(s) Signed: 08/10/2016 11:24:48 AM By: Christin Fudge MD, FACS Entered By: Christin Fudge on 08/10/2016 11:24:47 [name] ([id number]) -------------------------------------------------------------------------------- SuperBill Details Patient Name: [name] Date of Service: 08/10/2016 Medical Record Number:  [id number] Patient Account Number: [id number] Date of Birth/Sex: 31-Oct-1945 (71 y.o. Male) Treating RN: Montey Hora Primary Care Provider: Thomes Lolling Other Clinician: Referring Provider: Thomes Lolling Treating Provider/Extender: Frann Rider in Treatment: 5 Diagnosis Coding ICD-10 Codes Code Description [phone] Type 2 diabetes mellitus with other skin ulcer Unspecified open wound of abdominal wall, right upper quadrant without penetration into S31.100A peritoneal cavity, initial encounter Unspecified open wound of abdominal wall, epigastric region without penetration into S31.102A peritoneal cavity, initial encounter F17.228 Nicotine dependence, chewing tobacco, with other nicotine-induced disorders Facility Procedures CPT4 Code: [id number] Description: 99213 - WOUND CARE VISIT-LEV 3 EST PT Modifier: Quantity: 1 Physician Procedures CPT4: Description Modifier Quantity Code [id number] 99213 - WC PHYS LEVEL 3 - EST PT 1 ICD-10 Description Diagnosis E11.622 Type 2 diabetes mellitus with other skin ulcer S31.100A Unspecified open wound of abdominal wall, right upper quadrant without  penetration into peritoneal cavity, initial encounter S31.102A Unspecified open wound of abdominal wall, epigastric region without penetration into peritoneal cavity, initial encounter Electronic Signature(s) Signed: 08/10/2016 11:25:02 AM By: Christin Fudge MD, FACS Entered By: Christin Fudge on 08/10/2016 11:25:02

## 2016-08-12 NOTE — Progress Notes (Signed)
Michael, Jackson ([id number]) Visit Report for 08/10/2016 Arrival Information Details Patient Name: [name], [name]. Date of Service: 08/10/2016 10:15 AM Medical Record Number: [id number] Patient Account Number: [id number] Date of Birth/Sex: 1945/09/16 (71 y.o. Male) Treating RN: Montey Hora Primary Care Samhita Kretsch: Thomes Lolling Other Clinician: Referring Derreck Wiltsey: Thomes Lolling Treating Janal Haak/Extender: Frann Rider in Treatment: 5 Visit Information History Since Last Visit Added or deleted any medications: No Patient Arrived: Ambulatory Any new allergies or adverse reactions: No Arrival Time: 10:10 Had a fall or experienced change in No Accompanied By: self activities of daily living that may affect Transfer Assistance: None risk of falls: Patient Identification Verified: Yes Signs or symptoms of abuse/neglect since last No Secondary Verification Process Yes visito Completed: Hospitalized since last visit: No Patient Requires Transmission-Based No Has Dressing in Place as Prescribed: Yes Precautions: Pain Present Now: No Patient Has Alerts: Yes Patient Alerts: DMII Electronic Signature(s) Signed: 08/10/2016 4:52:26 PM By: Montey Hora Entered By: Montey Hora on 08/10/2016 10:11:12 [name] ([id number]) -------------------------------------------------------------------------------- Clinic Level of Care Assessment Details Patient Name: [name] Date of Service: 08/10/2016 10:15 AM Medical Record Number: [id number] Patient Account Number: [id number] Date of Birth/Sex: 1945-12-11 (71 y.o. Male) Treating RN: Montey Hora Primary Care Hiyab Nhem: Thomes Lolling Other Clinician: Referring Demetrica Zipp: Thomes Lolling Treating Trenita Hulme/Extender: Frann Rider in Treatment: 5 Clinic Level of Care Assessment Items TOOL 4 Quantity Score []  - Use when only an EandM is performed on FOLLOW-UP visit 0 ASSESSMENTS - Nursing Assessment / Reassessment X -  Reassessment of Co-morbidities (includes updates in patient status) 1 10 X - Reassessment of Adherence to Treatment Plan 1 5 ASSESSMENTS - Wound and Skin Assessment / Reassessment []  - Simple Wound Assessment / Reassessment - one wound 0 X - Complex Wound Assessment / Reassessment - multiple wounds 2 5 []  - Dermatologic / Skin Assessment (not related to wound area) 0 ASSESSMENTS - Focused Assessment []  - Circumferential Edema Measurements - multi extremities 0 []  - Nutritional Assessment / Counseling / Intervention 0 []  - Lower Extremity Assessment (monofilament, tuning fork, pulses) 0 []  - Peripheral Arterial Disease Assessment (using hand held doppler) 0 ASSESSMENTS - Ostomy and/or Continence Assessment and Care []  - Incontinence Assessment and Management 0 []  - Ostomy Care Assessment and Management (repouching, etc.) 0 PROCESS - Coordination of Care X - Simple Patient / Family Education for ongoing care 1 15 []  - Complex (extensive) Patient / Family Education for ongoing care 0 []  - Staff obtains Programmer, systems, Records, Test Results / Process Orders 0 []  - Staff telephones HHA, Nursing Homes / Clarify orders / etc 0 []  - Routine Transfer to another Facility (non-emergent condition) 0 EMELIO, SCHNELLER ([id number]) []  - Routine Hospital Admission (non-emergent condition) 0 []  - New Admissions / Biomedical engineer / Ordering NPWT, Apligraf, etc. 0 []  - Emergency Hospital Admission (emergent condition) 0 X - Simple Discharge Coordination 1 10 []  - Complex (extensive) Discharge Coordination 0 PROCESS - Special Needs []  - Pediatric / Minor Patient Management 0 []  - Isolation Patient Management 0 []  - Hearing / Language / Visual special needs 0 []  - Assessment of Community assistance (transportation, D/C planning, etc.) 0 []  - Additional assistance / Altered mentation 0 []  - Support Surface(s) Assessment (bed, cushion, seat, etc.) 0 INTERVENTIONS - Wound Cleansing / Measurement []  -  Simple Wound Cleansing - one wound 0 X - Complex Wound Cleansing - multiple wounds 2 5 X - Wound Imaging (photographs - any number of wounds) 1 5 []  -  Wound Tracing (instead of photographs) 0 []  - Simple Wound Measurement - one wound 0 X - Complex Wound Measurement - multiple wounds 2 5 INTERVENTIONS - Wound Dressings X - Small Wound Dressing one or multiple wounds 2 10 []  - Medium Wound Dressing one or multiple wounds 0 []  - Large Wound Dressing one or multiple wounds 0 []  - Application of Medications - topical 0 []  - Application of Medications - injection 0 INTERVENTIONS - Miscellaneous []  - External ear exam 0 KAYE, MITRO. ([id number]) []  - Specimen Collection (cultures, biopsies, blood, body fluids, etc.) 0 []  - Specimen(s) / Culture(s) sent or taken to Lab for analysis 0 []  - Patient Transfer (multiple staff / Harrel Lemon Lift / Similar devices) 0 []  - Simple Staple / Suture removal (25 or less) 0 []  - Complex Staple / Suture removal (26 or more) 0 []  - Hypo / Hyperglycemic Management (close monitor of Blood Glucose) 0 []  - Ankle / Brachial Index (ABI) - do not check if billed separately 0 X - Vital Signs 1 5 Has the patient been seen at the hospital within the last three years: Yes Total Score: 100 Level Of Care: New/Established - Level 3 Electronic Signature(s) Signed: 08/10/2016 4:52:26 PM By: Montey Hora Entered By: Montey Hora on 08/10/2016 10:33:27 [name] ([id number]) -------------------------------------------------------------------------------- Encounter Discharge Information Details Patient Name: [name] Date of Service: 08/10/2016 10:15 AM Medical Record Number: [id number] Patient Account Number: [id number] Date of Birth/Sex: 05/19/1945 (71 y.o. Male) Treating RN: Montey Hora Primary Care Adaijah Endres: Thomes Lolling Other Clinician: Referring Ravin Bendall: Thomes Lolling Treating Violeta Lecount/Extender: Frann Rider in Treatment: 5 Encounter  Discharge Information Items Discharge Pain Level: 0 Discharge Condition: Stable Ambulatory Status: Ambulatory Discharge Destination: Home Transportation: Private Auto Accompanied By: self Schedule Follow-up Appointment: Yes Medication Reconciliation completed and provided to Patient/Care No Jaeleen Inzunza: Provided on Clinical Summary of Care: 08/10/2016 Form Type Recipient Paper Patient FB Electronic Signature(s) Signed: 08/10/2016 10:39:29 AM By: [name] Entered By: [name] on 08/10/2016 10:39:27 [name] ([id number]) -------------------------------------------------------------------------------- Multi Wound Chart Details Patient Name: [name] Date of Service: 08/10/2016 10:15 AM Medical Record Number: [id number] Patient Account Number: [id number] Date of Birth/Sex: 1945-11-17 (71 y.o. Male) Treating RN: Montey Hora Primary Care Ava Tangney: Thomes Lolling Other Clinician: Referring Princessa Lesmeister: Thomes Lolling Treating Orley Lawry/Extender: Frann Rider in Treatment: 5 Vital Signs Height(in): 65 Pulse(bpm): 75 Weight(lbs): 161 Blood Pressure 145/77 (mmHg): Body Mass Index(BMI): 27 Temperature(F): 98.1 Respiratory Rate 18 (breaths/min): Photos: [N/A:N/A] Wound Location: Right Abdomen - midline Left Abdomen - midline N/A Wounding Event: Gradually Appeared Gradually Appeared N/A Primary Etiology: Atypical Atypical N/A Comorbid History: Cataracts, Chronic Cataracts, Chronic N/A Obstructive Pulmonary Obstructive Pulmonary Disease (COPD), Disease (COPD), Hypertension, Type II Hypertension, Type II Diabetes, Osteoarthritis, Diabetes, Osteoarthritis, Neuropathy Neuropathy Date Acquired: 05/17/2015 05/17/2015 N/A Weeks of Treatment: 5 5 N/A Wound Status: Open Open N/A Measurements L x W x D 1.3x0.2x0.1 4x0.4x0.1 N/A (cm) Area (cm) : 0.204 1.257 N/A Volume (cm) : 0.02 0.126 N/A % Reduction in Area: 94.10% 76.20% N/A % Reduction in Volume: 94.20%  76.10% N/A Classification: Full Thickness Without Full Thickness Without N/A Exposed Support Exposed Support Structures Structures Exudate Amount: Medium Medium N/A Exudate Type: Serous Serous N/A KEATH, MATERA ([id number]) Exudate Color: amber amber N/A Wound Margin: Flat and Intact Flat and Intact N/A Granulation Amount: Medium (34-66%) Large (67-100%) N/A Granulation Quality: Red Red N/A Necrotic Amount: Medium (34-66%) Small (1-33%) N/A Necrotic Tissue: Eschar Eschar N/A Exposed Structures: Fascia: No  Fascia: No N/A Fat Layer (Subcutaneous Fat Layer (Subcutaneous Tissue) Exposed: No Tissue) Exposed: No Tendon: No Tendon: No Muscle: No Muscle: No Joint: No Joint: No Bone: No Bone: No Limited to Skin Limited to Skin Breakdown Breakdown Epithelialization: Small (1-33%) Small (1-33%) N/A Periwound Skin Texture: Excoriation: No Excoriation: No N/A Induration: No Induration: No Callus: No Callus: No Crepitus: No Crepitus: No Rash: No Rash: No Scarring: No Scarring: No Periwound Skin Maceration: No Maceration: No N/A Moisture: Dry/Scaly: No Dry/Scaly: No Periwound Skin Color: Atrophie Blanche: No Atrophie Blanche: No N/A Cyanosis: No Cyanosis: No Ecchymosis: No Ecchymosis: No Erythema: No Erythema: No Hemosiderin Staining: No Hemosiderin Staining: No Mottled: No Mottled: No Pallor: No Pallor: No Rubor: No Rubor: No Temperature: No Abnormality No Abnormality N/A Tenderness on No No N/A Palpation: Wound Preparation: Ulcer Cleansing: Ulcer Cleansing: N/A Rinsed/Irrigated with Rinsed/Irrigated with Saline Saline Topical Anesthetic Topical Anesthetic Applied: Other: lidocaine Applied: Other: lidocaine 4% 4% Treatment Notes Wound #1 (Right Abdomen - midline) 1. Cleansed with: Clean wound with Normal Saline 2. Anesthetic Topical Lidocaine 4% cream to wound bed prior to debridement KOURY, RODDY. ([id number]) 3. Peri-wound Care: Skin Prep 4.  Dressing Applied: Hydrafera Blue 5. Secondary [place] #2 (Left Abdomen - midline) 1. Cleansed with: Clean wound with Normal Saline 2. Anesthetic Topical Lidocaine 4% cream to wound bed prior to debridement 3. Peri-wound Care: Skin Prep 4. Dressing Applied: Hydrafera Blue 5. Secondary [place] Signature(s) Signed: 08/10/2016 11:22:29 AM By: Christin Fudge MD, FACS Entered By: Christin Fudge on 08/10/2016 11:22:29 [name] ([id number]) -------------------------------------------------------------------------------- [place] Details Patient Name: KAELYN, NAUTA Date of Service: 08/10/2016 10:15 AM Medical Record Number: [id number] Patient Account Number: [id number] Date of Birth/Sex: 01-10-1946 (71 y.o. Male) Treating RN: Montey Hora Primary Care Janely Gullickson: Thomes Lolling Other Clinician: Referring Milee Qualls: Thomes Lolling Treating Lora Chavers/Extender: Frann Rider in Treatment: 5 Active Inactive ` Abuse / Safety / Falls / Self Care Management Nursing Diagnoses: Impaired physical mobility Goals: Patient will remain injury free Date Initiated: 07/03/2016 Target Resolution Date: 09/14/2016 Goal Status: Active Interventions: Assess fall risk on admission and as needed Notes: ` Orientation to the Wound Care Program Nursing Diagnoses: Knowledge deficit related to the wound healing center program Goals: Patient/caregiver will verbalize understanding of the [facility] Program Date Initiated: 07/03/2016 Target Resolution Date: 09/15/2016 Goal Status: Active Interventions: Provide education on orientation to the wound center Notes: ` Wound/Skin Impairment Nursing Diagnoses: Impaired tissue integrity CEM, KOSMAN ([id number]) Goals: Patient/caregiver will verbalize understanding of skin care regimen Date Initiated: 07/03/2016 Target Resolution Date: 09/15/2016 Goal Status:  Active Ulcer/skin breakdown will have a volume reduction of 30% by week 4 Date Initiated: 07/03/2016 Target Resolution Date: 09/15/2016 Goal Status: Active Ulcer/skin breakdown will have a volume reduction of 50% by week 8 Date Initiated: 07/03/2016 Target Resolution Date: 09/15/2016 Goal Status: Active Ulcer/skin breakdown will have a volume reduction of 80% by week 12 Date Initiated: 07/03/2016 Target Resolution Date: 09/15/2016 Goal Status: Active Ulcer/skin breakdown will heal within 14 weeks Date Initiated: 07/03/2016 Target Resolution Date: 09/15/2016 Goal Status: Active Interventions: Assess patient/caregiver ability to obtain necessary supplies Assess patient/caregiver ability to perform ulcer/skin care regimen upon admission and as needed Assess ulceration(s) every visit Notes: Electronic Signature(s) Signed: 08/10/2016 4:52:26 PM By: Montey Hora Entered By: Montey Hora on 08/10/2016 10:31:32 [name] ([id number]) -------------------------------------------------------------------------------- Pain Assessment Details Patient Name: [name] Date of Service: 08/10/2016 10:15 AM Medical Record Number:  [id number] Patient Account Number: [id number] Date of Birth/Sex: 1945-10-24 (71 y.o. Male) Treating RN: Montey Hora Primary Care Aneth Schlagel: Thomes Lolling Other Clinician: Referring Sheilia Reznick: Thomes Lolling Treating Debby Clyne/Extender: Frann Rider in Treatment: 5 Active Problems Location of Pain Severity and Description of Pain Patient Has Paino No Site Locations Pain Management and Medication Current Pain Management: Notes Topical or injectable lidocaine is offered to patient for acute pain when surgical debridement is performed. If needed, Patient is instructed to use over the counter pain medication for the following 24-48 hours after debridement. Wound care MDs do not prescribed pain medications. Patient has chronic pain or uncontrolled pain. Patient  has been instructed to make an appointment with their Primary Care Physician for pain management. Electronic Signature(s) Signed: 08/10/2016 4:52:26 PM By: Montey Hora Entered By: Montey Hora on 08/10/2016 10:11:21 [name] ([id number]) -------------------------------------------------------------------------------- Patient/Caregiver Education Details Patient Name: CARA, AGUINO Date of Service: 08/10/2016 10:15 AM Medical Record Number: [id number] Patient Account Number: [id number] Date of Birth/Gender: 02/21/1946 (71 y.o. Male) Treating RN: Montey Hora Primary Care Physician: Thomes Lolling Other Clinician: Referring Physician: Thomes Lolling Treating Physician/Extender: Frann Rider in Treatment: 5 Education Assessment Education Provided To: Patient Education Topics Provided Wound/Skin Impairment: Handouts: Other: wound care as ordered Methods: Demonstration, Explain/Verbal Responses: State content correctly Electronic Signature(s) Signed: 08/10/2016 4:52:26 PM By: Montey Hora Entered By: Montey Hora on 08/10/2016 10:32:31 [name] ([id number]) -------------------------------------------------------------------------------- Wound Assessment Details Patient Name: [name] Date of Service: 08/10/2016 10:15 AM Medical Record Number: [id number] Patient Account Number: [id number] Date of Birth/Sex: [birth year] (71 y.o. Male) Treating RN: Montey Hora Primary Care Bao Coreas: Thomes Lolling Other Clinician: Referring Wanisha Shiroma: Thomes Lolling Treating Maksymilian Mabey/Extender: Frann Rider in Treatment: 5 Wound Status Wound Number: 1 Primary Atypical Etiology: Wound Location: Right Abdomen - midline Wound Open Wounding Event: Gradually Appeared Status: Date Acquired: 05/17/2015 Comorbid Cataracts, Chronic Obstructive Weeks Of Treatment: 5 History: Pulmonary Disease (COPD), Clustered Wound: No Hypertension, Type II  Diabetes, Osteoarthritis, Neuropathy Photos Wound Measurements Length: (cm) 1.3 Width: (cm) 0.2 Depth: (cm) 0.1 Area: (cm) 0.204 Volume: (cm) 0.02 % Reduction in Area: 94.1% % Reduction in Volume: 94.2% Epithelialization: Small (1-33%) Tunneling: No Undermining: No Wound Description Full Thickness Without Exposed Foul Odor Afte Classification: Support Structures Slough/Fibrino Wound Margin: Flat and Intact Exudate Medium Amount: Exudate Type: Serous Exudate Color: amber r Cleansing: No No Wound Bed Granulation Amount: Medium (34-66%) Exposed Structure Granulation Quality: Red Fascia Exposed: No GODSON, POLLAN. ([id number]) Necrotic Amount: Medium (34-66%) Fat Layer (Subcutaneous Tissue) Exposed: No Necrotic Quality: Eschar Tendon Exposed: No Muscle Exposed: No Joint Exposed: No Bone Exposed: No Limited to Skin Breakdown Periwound Skin Texture Texture Color No Abnormalities Noted: No No Abnormalities Noted: No Callus: No Atrophie Blanche: No Crepitus: No Cyanosis: No Excoriation: No Ecchymosis: No Induration: No Erythema: No Rash: No Hemosiderin Staining: No Scarring: No Mottled: No Pallor: No Moisture Rubor: No No Abnormalities Noted: No Dry / Scaly: No Temperature / Pain Maceration: No Temperature: No Abnormality Wound Preparation Ulcer Cleansing: Rinsed/Irrigated with Saline Topical Anesthetic Applied: Other: lidocaine 4%, Treatment Notes Wound #1 (Right Abdomen - midline) 1. Cleansed with: Clean wound with Normal Saline 2. Anesthetic Topical Lidocaine 4% cream to wound bed prior to debridement 3. Peri-wound Care: Skin Prep 4. Dressing Applied: Hydrafera Blue 5. Secondary [place] Signature(s) Signed: 08/10/2016 4:52:26 PM By: Montey Hora Entered By: Montey Hora on 08/10/2016 10:31:08 [name]  ([id number]) -------------------------------------------------------------------------------- Wound Assessment Details Patient Name: [place],  Joseandres O. Date of Service: 08/10/2016 10:15 AM Medical Record Number: [id number] Patient Account Number: [id number] Date of Birth/Sex: 07/08/1945 (71 y.o. Male) Treating RN: Montey Hora Primary Care Sedric Guia: Thomes Lolling Other Clinician: Referring Morry Veiga: Thomes Lolling Treating Arriyanna Mersch/Extender: Frann Rider in Treatment: 5 Wound Status Wound Number: 2 Primary Atypical Etiology: Wound Location: Left Abdomen - midline Wound Open Wounding Event: Gradually Appeared Status: Date Acquired: 05/17/2015 Comorbid Cataracts, Chronic Obstructive Weeks Of Treatment: 5 History: Pulmonary Disease (COPD), Clustered Wound: No Hypertension, Type II Diabetes, Osteoarthritis, Neuropathy Photos Wound Measurements Length: (cm) 4 Width: (cm) 0.4 Depth: (cm) 0.1 Area: (cm) 1.257 Volume: (cm) 0.126 % Reduction in Area: 76.2% % Reduction in Volume: 76.1% Epithelialization: Small (1-33%) Tunneling: No Undermining: No Wound Description Full Thickness Without Exposed Foul Odor Afte Classification: Support Structures Slough/Fibrino Wound Margin: Flat and Intact Exudate Medium Amount: Exudate Type: Serous Exudate Color: amber r Cleansing: No No Wound Bed Granulation Amount: Large (67-100%) Exposed Structure Granulation Quality: Red Fascia Exposed: No EFRAIM, VANALLEN. ([id number]) Necrotic Amount: Small (1-33%) Fat Layer (Subcutaneous Tissue) Exposed: No Necrotic Quality: Eschar Tendon Exposed: No Muscle Exposed: No Joint Exposed: No Bone Exposed: No Limited to Skin Breakdown Periwound Skin Texture Texture Color No Abnormalities Noted: No No Abnormalities Noted: No Callus: No Atrophie Blanche: No Crepitus: No Cyanosis: No Excoriation: No Ecchymosis: No Induration: No Erythema: No Rash: No Hemosiderin Staining:  No Scarring: No Mottled: No Pallor: No Moisture Rubor: No No Abnormalities Noted: No Dry / Scaly: No Temperature / Pain Maceration: No Temperature: No Abnormality Wound Preparation Ulcer Cleansing: Rinsed/Irrigated with Saline Topical Anesthetic Applied: Other: lidocaine 4%, Treatment Notes Wound #2 (Left Abdomen - midline) 1. Cleansed with: Clean wound with Normal Saline 2. Anesthetic Topical Lidocaine 4% cream to wound bed prior to debridement 3. Peri-wound Care: Skin Prep 4. Dressing Applied: Hydrafera Blue 5. Secondary [place] Signature(s) Signed: 08/10/2016 4:52:26 PM By: Montey Hora Entered By: Montey Hora on 08/10/2016 10:31:21 [name] ([id number]) -------------------------------------------------------------------------------- [place] Details Patient Name: [name] Date of Service: 08/10/2016 10:15 AM Medical Record Number: [id number] Patient Account Number: [id number] Date of Birth/Sex: 12-Apr-1945 (71 y.o. Male) Treating RN: Montey Hora Primary Care Kortny Lirette: Thomes Lolling Other Clinician: Referring Tenecia Ignasiak: Thomes Lolling Treating Payson Evrard/Extender: Frann Rider in Treatment: 5 Vital Signs Time Taken: 10:12 Temperature (F): 98.1 Height (in): 65 Pulse (bpm): 75 Weight (lbs): 161 Respiratory Rate (breaths/min): 18 Body Mass Index (BMI): 26.8 Blood Pressure (mmHg): 145/77 Reference Range: 80 - 120 mg / dl Electronic Signature(s) Signed: 08/10/2016 4:52:26 PM By: Montey Hora Entered By: Montey Hora on 08/10/2016 10:13:49

## 2016-08-14 ENCOUNTER — Encounter: Payer: Medicare Other | Attending: Surgery | Admitting: Surgery

## 2016-08-14 DIAGNOSIS — Z79899 Other long term (current) drug therapy: Secondary | ICD-10-CM | POA: Insufficient documentation

## 2016-08-14 DIAGNOSIS — G8929 Other chronic pain: Secondary | ICD-10-CM | POA: Insufficient documentation

## 2016-08-14 DIAGNOSIS — M1990 Unspecified osteoarthritis, unspecified site: Secondary | ICD-10-CM | POA: Insufficient documentation

## 2016-08-14 DIAGNOSIS — Z7984 Long term (current) use of oral hypoglycemic drugs: Secondary | ICD-10-CM | POA: Diagnosis not present

## 2016-08-14 DIAGNOSIS — J449 Chronic obstructive pulmonary disease, unspecified: Secondary | ICD-10-CM | POA: Diagnosis not present

## 2016-08-14 DIAGNOSIS — E1142 Type 2 diabetes mellitus with diabetic polyneuropathy: Secondary | ICD-10-CM | POA: Insufficient documentation

## 2016-08-14 DIAGNOSIS — F17228 Nicotine dependence, chewing tobacco, with other nicotine-induced disorders: Secondary | ICD-10-CM | POA: Diagnosis not present

## 2016-08-14 DIAGNOSIS — M549 Dorsalgia, unspecified: Secondary | ICD-10-CM | POA: Insufficient documentation

## 2016-08-14 DIAGNOSIS — S31102A Unspecified open wound of abdominal wall, epigastric region without penetration into peritoneal cavity, initial encounter: Secondary | ICD-10-CM | POA: Diagnosis not present

## 2016-08-14 DIAGNOSIS — K219 Gastro-esophageal reflux disease without esophagitis: Secondary | ICD-10-CM | POA: Diagnosis not present

## 2016-08-14 DIAGNOSIS — X58XXXA Exposure to other specified factors, initial encounter: Secondary | ICD-10-CM | POA: Insufficient documentation

## 2016-08-14 DIAGNOSIS — E538 Deficiency of other specified B group vitamins: Secondary | ICD-10-CM | POA: Insufficient documentation

## 2016-08-14 DIAGNOSIS — Z79891 Long term (current) use of opiate analgesic: Secondary | ICD-10-CM | POA: Diagnosis not present

## 2016-08-14 DIAGNOSIS — S31100A Unspecified open wound of abdominal wall, right upper quadrant without penetration into peritoneal cavity, initial encounter: Secondary | ICD-10-CM | POA: Insufficient documentation

## 2016-08-14 DIAGNOSIS — I10 Essential (primary) hypertension: Secondary | ICD-10-CM | POA: Diagnosis not present

## 2016-08-14 DIAGNOSIS — E11622 Type 2 diabetes mellitus with other skin ulcer: Secondary | ICD-10-CM | POA: Insufficient documentation

## 2016-08-14 NOTE — Progress Notes (Signed)
[name] ([id number]) Visit Report for 08/14/2016 Arrival Information Details Patient Name: [name], [name]. Date of Service: 08/14/2016 9:15 AM Medical Record Number: [id number] Patient Account Number: [id number] Date of Birth/Sex: January 05, 1946 (71 y.o. Male) Treating RN: Cornell Barman Primary Care Travonne Schowalter: Thomes Lolling Other Clinician: Referring Hadley Soileau: Thomes Lolling Treating Seher Schlagel/Extender: Frann Rider in Treatment: 6 Visit Information History Since Last Visit Added or deleted any medications: No Patient Arrived: Ambulatory Any new allergies or adverse reactions: No Arrival Time: 09:09 Had a fall or experienced change in No Accompanied By: self activities of daily living that may affect Transfer Assistance: None risk of falls: Patient Identification Verified: Yes Signs or symptoms of abuse/neglect since last No Secondary Verification Process Yes visito Completed: Hospitalized since last visit: No Patient Requires Transmission-Based No Has Dressing in Place as Prescribed: Yes Precautions: Pain Present Now: No Patient Has Alerts: Yes Patient Alerts: DMII Electronic Signature(s) Signed: 08/14/2016 3:42:03 PM By: Gretta Cool, BSN, RN, CWS, Kim RN, BSN Entered By: Gretta Cool, BSN, RN, CWS, Kim on 08/14/2016 09:09:56 [name] ([id number]) -------------------------------------------------------------------------------- Clinic Level of Care Assessment Details Patient Name: [name]. Date of Service: 08/14/2016 9:15 AM Medical Record Number: [id number] Patient Account Number: [id number] Date of Birth/Sex: 13-Apr-1945 (71 y.o. Male) Treating RN: Cornell Barman Primary Care Lasasha Brophy: Thomes Lolling Other Clinician: Referring Dragan Tamburrino: Thomes Lolling Treating Malic Rosten/Extender: Frann Rider in Treatment: 6 Clinic Level of Care Assessment Items TOOL 4 Quantity Score []  - Use when only an EandM is performed on FOLLOW-UP visit 0 ASSESSMENTS - Nursing Assessment /  Reassessment []  - Reassessment of Co-morbidities (includes updates in patient status) 0 X - Reassessment of Adherence to Treatment Plan 1 5 ASSESSMENTS - Wound and Skin Assessment / Reassessment X - Simple Wound Assessment / Reassessment - one wound 1 5 []  - Complex Wound Assessment / Reassessment - multiple wounds 0 []  - Dermatologic / Skin Assessment (not related to wound area) 0 ASSESSMENTS - Focused Assessment []  - Circumferential Edema Measurements - multi extremities 0 []  - Nutritional Assessment / Counseling / Intervention 0 []  - Lower Extremity Assessment (monofilament, tuning fork, pulses) 0 []  - Peripheral Arterial Disease Assessment (using hand held doppler) 0 ASSESSMENTS - Ostomy and/or Continence Assessment and Care []  - Incontinence Assessment and Management 0 []  - Ostomy Care Assessment and Management (repouching, etc.) 0 PROCESS - Coordination of Care X - Simple Patient / Family Education for ongoing care 1 15 []  - Complex (extensive) Patient / Family Education for ongoing care 0 []  - Staff obtains Programmer, systems, Records, Test Results / Process Orders 0 []  - Staff telephones HHA, Nursing Homes / Clarify orders / etc 0 []  - Routine Transfer to another Facility (non-emergent condition) 0 KIING, DEAKIN ([id number]) []  - Routine Hospital Admission (non-emergent condition) 0 []  - New Admissions / Biomedical engineer / Ordering NPWT, Apligraf, etc. 0 []  - Emergency Hospital Admission (emergent condition) 0 X - Simple Discharge Coordination 1 10 []  - Complex (extensive) Discharge Coordination 0 PROCESS - Special Needs []  - Pediatric / Minor Patient Management 0 []  - Isolation Patient Management 0 []  - Hearing / Language / Visual special needs 0 []  - Assessment of Community assistance (transportation, D/C planning, etc.) 0 []  - Additional assistance / Altered mentation 0 []  - Support Surface(s) Assessment (bed, cushion, seat, etc.) 0 INTERVENTIONS - Wound Cleansing /  Measurement X - Simple Wound Cleansing - one wound 1 5 []  - Complex Wound Cleansing - multiple wounds 0 X - Wound Imaging (photographs -  any number of wounds) 1 5 []  - Wound Tracing (instead of photographs) 0 X - Simple Wound Measurement - one wound 1 5 []  - Complex Wound Measurement - multiple wounds 0 INTERVENTIONS - Wound Dressings []  - Small Wound Dressing one or multiple wounds 0 X - Medium Wound Dressing one or multiple wounds 2 15 []  - Large Wound Dressing one or multiple wounds 0 []  - Application of Medications - topical 0 []  - Application of Medications - injection 0 INTERVENTIONS - Miscellaneous []  - External ear exam 0 COLUM, COLT ([id number]) []  - Specimen Collection (cultures, biopsies, blood, body fluids, etc.) 0 []  - Specimen(s) / Culture(s) sent or taken to Lab for analysis 0 []  - Patient Transfer (multiple staff / Harrel Lemon Lift / Similar devices) 0 []  - Simple Staple / Suture removal (25 or less) 0 []  - Complex Staple / Suture removal (26 or more) 0 []  - Hypo / Hyperglycemic Management (close monitor of Blood Glucose) 0 []  - Ankle / Brachial Index (ABI) - do not check if billed separately 0 X - Vital Signs 1 5 Has the patient been seen at the hospital within the last three years: Yes Total Score: 85 Level Of Care: New/Established - Level 3 Electronic Signature(s) Signed: 08/14/2016 3:42:03 PM By: Gretta Cool, BSN, RN, CWS, Kim RN, BSN Entered By: Gretta Cool, BSN, RN, CWS, Kim on 08/14/2016 09:29:55 [name] ([id number]) -------------------------------------------------------------------------------- Encounter Discharge Information Details Patient Name: [name]. Date of Service: 08/14/2016 9:15 AM Medical Record Number: [id number] Patient Account Number: [id number] Date of Birth/Sex: 12/15/1945 (71 y.o. Male) Treating RN: Cornell Barman Primary Care Jhovani Griswold: Thomes Lolling Other Clinician: Referring Kyaire Gruenewald: Thomes Lolling Treating Elaiza Shoberg/Extender: Frann Rider in Treatment: 6 Encounter Discharge Information Items Discharge Pain Level: 0 Discharge Condition: Stable Ambulatory Status: Ambulatory Discharge Destination: Home Transportation: Private Auto Accompanied By: self Schedule Follow-up Appointment: Yes Medication Reconciliation completed Yes and provided to Patient/Care Aasim Restivo: Patient Clinical Summary of Care: Declined Electronic Signature(s) Signed: 08/14/2016 3:42:03 PM By: Gretta Cool, BSN, RN, CWS, Kim RN, BSN Previous Signature: 08/14/2016 9:30:08 AM Version By: Sharon Mt Entered By: Gretta Cool BSN, RN, CWS, Kim on 08/14/2016 09:30:46 DIOGENES, WHIRLEY ([id number]) -------------------------------------------------------------------------------- Multi Wound Chart Details Patient Name: RAYLON, LAMSON Date of Service: 08/14/2016 9:15 AM Medical Record Number: [id number] Patient Account Number: [id number] Date of Birth/Sex: Aug 26, 1945 (71 y.o. Male) Treating RN: Cornell Barman Primary Care Kalyse Meharg: Thomes Lolling Other Clinician: Referring Jamie-Lee Galdamez: Thomes Lolling Treating Shareece Bultman/Extender: Frann Rider in Treatment: 6 Vital Signs Height(in): 65 Pulse(bpm): 76 Weight(lbs): 161 Blood Pressure 142/80 (mmHg): Body Mass Index(BMI): 27 Temperature(F): 98.1 Respiratory Rate 18 (breaths/min): Photos: [N/A:N/A] Wound Location: Right Abdomen - midline Left Abdomen - midline N/A Wounding Event: Gradually Appeared Gradually Appeared N/A Primary Etiology: Atypical Atypical N/A Comorbid History: Cataracts, Chronic Cataracts, Chronic N/A Obstructive Pulmonary Obstructive Pulmonary Disease (COPD), Disease (COPD), Hypertension, Type II Hypertension, Type II Diabetes, Osteoarthritis, Diabetes, Osteoarthritis, Neuropathy Neuropathy Date Acquired: 05/17/2015 05/17/2015 N/A Weeks of Treatment: 6 6 N/A Wound Status: Open Open N/A Measurements L x W x D 1.2x0.3x0.1 4x1x0.1 N/A (cm) Area (cm) : 0.283 3.142 N/A Volume (cm) : 0.028  0.314 N/A % Reduction in Area: 91.80% 40.50% N/A % Reduction in Volume: 91.80% 40.50% N/A Position 1 (o'clock): 6 Maximum Distance 1 0.6 (cm): Tunneling: No Yes N/A Classification: Full Thickness Without Full Thickness Without N/A Exposed Support Exposed Support Structures Structures [place], SHAHEER O. ([id number]) Exudate Amount: Small Medium N/A Exudate Type: Serous Serous N/A Exudate Color: amber  amber N/A Wound Margin: Flat and Intact Flat and Intact N/A Granulation Amount: None Present (0%) Large (67-100%) N/A Granulation Quality: N/A Red N/A Necrotic Amount: Large (67-100%) Small (1-33%) N/A Necrotic Tissue: Eschar Eschar N/A Exposed Structures: Fascia: No Fascia: No N/A Fat Layer (Subcutaneous Fat Layer (Subcutaneous Tissue) Exposed: No Tissue) Exposed: No Tendon: No Tendon: No Muscle: No Muscle: No Joint: No Joint: No Bone: No Bone: No Limited to Skin Limited to Skin Breakdown Breakdown Epithelialization: Large (67-100%) Small (1-33%) N/A Periwound Skin Texture: Scarring: Yes Induration: Yes N/A Excoriation: No Scarring: Yes Induration: No Excoriation: No Callus: No Callus: No Crepitus: No Crepitus: No Rash: No Rash: No Periwound Skin Maceration: No Maceration: No N/A Moisture: Dry/Scaly: No Dry/Scaly: No Periwound Skin Color: Atrophie Blanche: No Ecchymosis: Yes N/A Cyanosis: No Atrophie Blanche: No Ecchymosis: No Cyanosis: No Erythema: No Erythema: No Hemosiderin Staining: No Hemosiderin Staining: No Mottled: No Mottled: No Pallor: No Pallor: No Rubor: No Rubor: No Temperature: No Abnormality No Abnormality N/A Tenderness on No No N/A Palpation: Wound Preparation: Ulcer Cleansing: Ulcer Cleansing: N/A Rinsed/Irrigated with Rinsed/Irrigated with Saline Saline Topical Anesthetic Topical Anesthetic Applied: Other: lidocaine Applied: Other: lidocaine 4% 4% Treatment Notes Electronic Signature(s) Signed: 08/14/2016 9:28:33 AM By: Christin Fudge MD, FACS IRIE, FIORELLO ([id number]) Entered By: Christin Fudge on 08/14/2016 09:28:33 [name] ([id number]) -------------------------------------------------------------------------------- [place] Details Patient Name: THELMA, VIANA Date of Service: 08/14/2016 9:15 AM Medical Record Number: [id number] Patient Account Number: [id number] Date of Birth/Sex: 11-Jan-1946 (71 y.o. Male) Treating RN: Cornell Barman Primary Care Adar Rase: Thomes Lolling Other Clinician: Referring Sharniece Gibbon: Thomes Lolling Treating Jeane Cashatt/Extender: Frann Rider in Treatment: 6 Active Inactive ` Abuse / Safety / Falls / Self Care Management Nursing Diagnoses: Impaired physical mobility Goals: Patient will remain injury free Date Initiated: 07/03/2016 Target Resolution Date: 09/14/2016 Goal Status: Active Interventions: Assess fall risk on admission and as needed Notes: ` Orientation to the Wound Care Program Nursing Diagnoses: Knowledge deficit related to the wound healing center program Goals: Patient/caregiver will verbalize understanding of the [place] Program Date Initiated: 07/03/2016 Target Resolution Date: 09/15/2016 Goal Status: Active Interventions: Provide education on orientation to the wound center Notes: ` Wound/Skin Impairment Nursing Diagnoses: Impaired tissue integrity ADOLF, ORMISTON ([id number]) Goals: Patient/caregiver will verbalize understanding of skin care regimen Date Initiated: 07/03/2016 Target Resolution Date: 09/15/2016 Goal Status: Active Ulcer/skin breakdown will have a volume reduction of 30% by week 4 Date Initiated: 07/03/2016 Target Resolution Date: 09/15/2016 Goal Status: Active Ulcer/skin breakdown will have a volume reduction of 50% by week 8 Date Initiated: 07/03/2016 Target Resolution Date: 09/15/2016 Goal Status: Active Ulcer/skin breakdown will have a volume reduction of 80% by week 12 Date Initiated:  07/03/2016 Target Resolution Date: 09/15/2016 Goal Status: Active Ulcer/skin breakdown will heal within 14 weeks Date Initiated: 07/03/2016 Target Resolution Date: 09/15/2016 Goal Status: Active Interventions: Assess patient/caregiver ability to obtain necessary supplies Assess patient/caregiver ability to perform ulcer/skin care regimen upon admission and as needed Assess ulceration(s) every visit Notes: Electronic Signature(s) Signed: 08/14/2016 3:42:03 PM By: Gretta Cool, BSN, RN, CWS, Kim RN, BSN Entered By: Gretta Cool, BSN, RN, CWS, Kim on 08/14/2016 09:23:17 [name] ([id number]) -------------------------------------------------------------------------------- Pain Assessment Details Patient Name: ELMOND, POEHLMAN. Date of Service: 08/14/2016 9:15 AM Medical Record Number: [id number] Patient Account Number: [id number] Date of Birth/Sex: 1945/09/17 (71 y.o. Male) Treating RN: Cornell Barman Primary Care Augie Vane: Thomes Lolling Other Clinician: Referring Aizah Gehlhausen: Thomes Lolling Treating Clifford Benninger/Extender: Frann Rider in Treatment: 6 Active  Problems Location of Pain Severity and Description of Pain Patient Has Paino No Site Locations With Dressing Change: No Pain Management and Medication Current Pain Management: Goals for Pain Management Topical or injectable lidocaine is offered to patient for acute pain when surgical debridement is performed. If needed, Patient is instructed to use over the counter pain medication for the following 24-48 hours after debridement. Wound care MDs do not prescribed pain medications. Patient has chronic pain or uncontrolled pain. Patient has been instructed to make an appointment with their Primary Care Physician for pain management. Electronic Signature(s) Signed: 08/14/2016 3:42:03 PM By: Gretta Cool, BSN, RN, CWS, Kim RN, BSN Entered By: Gretta Cool, BSN, RN, CWS, Kim on 08/14/2016 09:10:26 [name]  ([id number]) -------------------------------------------------------------------------------- Patient/Caregiver Education Details Patient Name: Alkire, DUNLAP Date of Service: 08/14/2016 9:15 AM Medical Record Number: [id number] Patient Account Number: [id number] Date of Birth/Gender: Jul 21, 1945 (71 y.o. Male) Treating RN: Cornell Barman Primary Care Physician: Thomes Lolling Other Clinician: Referring Physician: Thomes Lolling Treating Physician/Extender: Frann Rider in Treatment: 6 Education Assessment Education Provided To: Patient Education Topics Provided Wound/Skin Impairment: Handouts: Caring for Your Ulcer, Other: continue wound care as prescribed Methods: Explain/Verbal Responses: State content correctly Notes Follow-up appointment with surgeon June 14. Electronic Signature(s) Signed: 08/14/2016 3:42:03 PM By: Gretta Cool, BSN, RN, CWS, Kim RN, BSN Entered By: Gretta Cool, BSN, RN, CWS, Kim on 08/14/2016 09:31:33 RICARD, FAULKNER ([id number]) -------------------------------------------------------------------------------- Wound Assessment Details Patient Name: RODDIE, RIEGLER Date of Service: 08/14/2016 9:15 AM Medical Record Number: [id number] Patient Account Number: [id number] Date of Birth/Sex: [birth year] (71 y.o. Male) Treating RN: Cornell Barman Primary Care Danikah Budzik: Thomes Lolling Other Clinician: Referring Tyson Masin: Thomes Lolling Treating Madylyn Insco/Extender: Frann Rider in Treatment: 6 Wound Status Wound Number: 1 Primary Atypical Etiology: Wound Location: Right Abdomen - midline Wound Open Wounding Event: Gradually Appeared Status: Date Acquired: 05/17/2015 Comorbid Cataracts, Chronic Obstructive Weeks Of Treatment: 6 History: Pulmonary Disease (COPD), Clustered Wound: No Hypertension, Type II Diabetes, Osteoarthritis, Neuropathy Photos Wound Measurements Length: (cm) 1.2 Width: (cm) 0.3 Depth: (cm) 0.1 Area: (cm) 0.283 Volume: (cm) 0.028 % Reduction in  Area: 91.8% % Reduction in Volume: 91.8% Epithelialization: Large (67-100%) Tunneling: No Undermining: No Wound Description Full Thickness Without Exposed Classification: Support Structures Wound Margin: Flat and Intact Exudate Small Amount: Exudate Type: Serous Exudate Color: amber Foul Odor After Cleansing: No Slough/Fibrino No Wound Bed Granulation Amount: None Present (0%) Exposed Structure Necrotic Amount: Large (67-100%) Fascia Exposed: No Necrotic Quality: Eschar Fat Layer (Subcutaneous Tissue) Exposed: No Tendon Exposed: No MING, MCMANNIS. ([id number]) Muscle Exposed: No Joint Exposed: No Bone Exposed: No Limited to Skin Breakdown Periwound Skin Texture Texture Color No Abnormalities Noted: No No Abnormalities Noted: No Callus: No Atrophie Blanche: No Crepitus: No Cyanosis: No Excoriation: No Ecchymosis: No Induration: No Erythema: No Rash: No Hemosiderin Staining: No Scarring: Yes Mottled: No Pallor: No Moisture Rubor: No No Abnormalities Noted: No Dry / Scaly: No Temperature / Pain Maceration: No Temperature: No Abnormality Wound Preparation Ulcer Cleansing: Rinsed/Irrigated with Saline Topical Anesthetic Applied: Other: lidocaine 4%, Treatment Notes Wound #1 (Right Abdomen - midline) 1. Cleansed with: Clean wound with Normal Saline 2. Anesthetic Topical Lidocaine 4% cream to wound bed prior to debridement 4. Dressing Applied: Hydrafera Blue 5. Secondary Chalmers Signature(s) Signed: 08/14/2016 3:42:03 PM By: Gretta Cool, BSN, RN, CWS, Kim RN, BSN Entered By: Gretta Cool, BSN, RN, CWS, Kim on 08/14/2016 09:16:54 [name] ([id number]) -------------------------------------------------------------------------------- Wound Assessment Details Patient Name: SHANT, HENCE Date  of Service: 08/14/2016 9:15 AM Medical Record Number: [id number] Patient Account Number: [id number] Date of Birth/Sex: 1945-03-16 (71 y.o.  Male) Treating RN: Cornell Barman Primary Care Andersen Mckiver: Thomes Lolling Other Clinician: Referring Anabelen Kaminsky: Thomes Lolling Treating Demichael Traum/Extender: Frann Rider in Treatment: 6 Wound Status Wound Number: 2 Primary Atypical Etiology: Wound Location: Left Abdomen - midline Wound Open Wounding Event: Gradually Appeared Status: Date Acquired: 05/17/2015 Comorbid Cataracts, Chronic Obstructive Weeks Of Treatment: 6 History: Pulmonary Disease (COPD), Clustered Wound: No Hypertension, Type II Diabetes, Osteoarthritis, Neuropathy Photos Wound Measurements Length: (cm) 4 Width: (cm) 1 Depth: (cm) 0.1 Area: (cm) 3.142 Volume: (cm) 0.314 % Reduction in Area: 40.5% % Reduction in Volume: 40.5% Epithelialization: Small (1-33%) Tunneling: Yes Position (o'clock): 6 Maximum Distance: (cm) 0.6 Undermining: No Wound Description Full Thickness Without Exposed Classification: Support Structures Wound Margin: Flat and Intact Exudate Medium Amount: Exudate Type: Serous Exudate Color: amber Foul Odor After Cleansing: No Slough/Fibrino No Wound Bed Granulation Amount: Large (67-100%) Exposed Structure JYRON, TURMAN ([id number]) Granulation Quality: Red Fascia Exposed: No Necrotic Amount: Small (1-33%) Fat Layer (Subcutaneous Tissue) Exposed: No Necrotic Quality: Eschar Tendon Exposed: No Muscle Exposed: No Joint Exposed: No Bone Exposed: No Limited to Skin Breakdown Periwound Skin Texture Texture Color No Abnormalities Noted: No No Abnormalities Noted: No Callus: No Atrophie Blanche: No Crepitus: No Cyanosis: No Excoriation: No Ecchymosis: Yes Induration: Yes Erythema: No Rash: No Hemosiderin Staining: No Scarring: Yes Mottled: No Pallor: No Moisture Rubor: No No Abnormalities Noted: No Dry / Scaly: No Temperature / Pain Maceration: No Temperature: No Abnormality Wound Preparation Ulcer Cleansing: Rinsed/Irrigated with Saline Topical Anesthetic  Applied: Other: lidocaine 4%, Treatment Notes Wound #2 (Left Abdomen - midline) 1. Cleansed with: Clean wound with Normal Saline 2. Anesthetic Topical Lidocaine 4% cream to wound bed prior to debridement 4. Dressing Applied: Hydrafera Blue 5. Secondary Anderson Signature(s) Signed: 08/14/2016 3:42:03 PM By: Gretta Cool, BSN, RN, CWS, Kim RN, BSN Entered By: Gretta Cool, BSN, RN, CWS, Kim on 08/14/2016 09:15:24 [name] ([id number]) -------------------------------------------------------------------------------- [place] Details Patient Name: SPARSH, CALLENS Date of Service: 08/14/2016 9:15 AM Medical Record Number: [id number] Patient Account Number: [id number] Date of Birth/Sex: [birth year] (71 y.o. Male) Treating RN: Cornell Barman Primary Care Jansen Sciuto: Thomes Lolling Other Clinician: Referring Hardy Harcum: Thomes Lolling Treating Donyale Falcon/Extender: Frann Rider in Treatment: 6 Vital Signs Time Taken: 09:10 Temperature (F): 98.1 Height (in): 65 Pulse (bpm): 76 Weight (lbs): 161 Respiratory Rate (breaths/min): 18 Body Mass Index (BMI): 26.8 Blood Pressure (mmHg): 142/80 Reference Range: 80 - 120 mg / dl Electronic Signature(s) Signed: 08/14/2016 3:42:03 PM By: Gretta Cool, BSN, RN, CWS, Kim RN, BSN Entered By: Gretta Cool, BSN, RN, CWS, Kim on 08/14/2016 09:10:42

## 2016-08-14 NOTE — Progress Notes (Signed)
[name], [name] ([id number]) Visit Report for 08/14/2016 Chief Complaint Document Details Patient Name: [name], [name]. Date of Service: 08/14/2016 9:15 AM Medical Record Number: [id number] Patient Account Number: [id number] Date of Birth/Sex: [birth year] (71 y.o. Male) Treating RN: Cornell Barman Primary Care Provider: Thomes Lolling Other Clinician: Referring Provider: Thomes Lolling Treating Provider/Extender: Frann Rider in Treatment: 6 Information Obtained from: Patient Chief Complaint Patients presents for treatment of an open diabetic ulcer to the anterior abdominal wall in the epigastric and right upper quadrant which she's had for over 5 years Electronic Signature(s) Signed: 08/14/2016 9:28:40 AM By: Christin Fudge MD, FACS Entered By: Christin Fudge on 08/14/2016 09:28:40 [name] ([id number]) -------------------------------------------------------------------------------- HPI Details Patient Name: [name] Date of Service: 08/14/2016 9:15 AM Medical Record Number: [id number] Patient Account Number: [id number] Date of Birth/Sex: [birth year] (71 y.o. Male) Treating RN: Cornell Barman Primary Care Provider: Thomes Lolling Other Clinician: Referring Provider: Thomes Lolling Treating Provider/Extender: Frann Rider in Treatment: 6 History of Present Illness Location: abdomen wall wounds,-- right upper quadrant and epigastric region Quality: Patient reports experiencing a dull pain to affected area(s). Severity: Patient states wound are getting worse. Duration: Patient has had the wound for >5 year's prior to seeking treatment at the wound center Timing: Pain in wound is Intermittent (comes and goes Context: The wound appeared gradually over time Modifying Factors: Consults to this date include:not seen a surgeon and only sees his PCP for his medical complaints Associated Signs and Symptoms: Patient reports having increase discharge. HPI Description: 71 year old  gentleman has been seen recently by his PCP Dr. Thomes Lolling, who sees him with a history of diabetes mellitus, peripheral neuropathy, B12 deficiency, hypertension, osteoarthritis and the patient had recent blood sugar checked which was 163. Past medical history includes essential hypertension, incisional hernia, diabetes mellitus, colon polyps, COPD, tobacco abuse in the past, chewing tobacco, GERD. He has also been treated for chronic back pain with no sciatica and is on oxycodone. His last hemoglobin A1c was 6.9% His abdominal surgery was done at [facility] over 15 years ago and gradually there was sutures which protruded and then there was mesh which protruded. He washes this during his shower but does not put any dressing and the wound is covered with a lot of debris. He has never had a surgical consultation for this. 07/10/2016 -- the patient has spoken to his PCP who said he was going to call me but I have not yet had a phone conversation with the physician. From what I understand the physician was reluctant to have a surgical opinion because of the patient's several comorbidities and he feels that the surgery may be detrimental to his overall health 07/24/2016 -- the patient's daughter is at the bedside and we've had a detailed discussion regarding his options for a surgical opinion and possible surgical intervention before this becomes a emergent situation. She will seek primary care input and take the father to [facility] for a surgical opinion. 07/31/2016 -- the patient's daughter has organized for a surgical opinion at [facility] on June 14 08/14/2016 -- it has been about 3 weeks since he has stopped chewing tobacco Electronic Signature(s) Signed: 08/14/2016 9:28:59 AM By: Christin Fudge MD, FACS Entered By: Christin Fudge on 08/14/2016 09:28:59 [name] ([id number]) -------------------------------------------------------------------------------- Physical Exam  Details Patient Name: [name], [name] Date of Service: 08/14/2016 9:15 AM Medical Record Number: [id number] Patient Account Number: [id number] Date of Birth/Sex: [birth year] (71 y.o. Male) Treating RN:  Cornell Barman Primary Care Provider: Thomes Lolling Other Clinician: Referring Provider: Thomes Lolling Treating Provider/Extender: Frann Rider in Treatment: 6 Constitutional . Pulse regular. Respirations normal and unlabored. Afebrile. . Eyes Nonicteric. Reactive to light. Ears, Nose, Mouth, and Throat Lips, teeth, and gums WNL.[name] Moist mucosa without lesions. Neck supple and nontender. No palpable supraclavicular or cervical adenopathy. Normal sized without goiter. Respiratory WNL. No retractions.. Breath sounds WNL, No rubs, rales, rhonchi, or wheeze.. Cardiovascular Heart rhythm and rate regular, no murmur or gallop.. Pedal Pulses WNL. No clubbing, cyanosis or edema. Chest Breasts symmetical and no nipple discharge.. Breast tissue WNL, no masses, lumps, or tenderness.. Lymphatic No adneopathy. No adenopathy. No adenopathy. Musculoskeletal Adexa without tenderness or enlargement.. Digits and nails w/o clubbing, cyanosis, infection, petechiae, ischemia, or inflammatory conditions.. Integumentary (Hair, Skin) No suspicious lesions. No crepitus or fluctuance. No peri-wound warmth or erythema. No masses.[name] Psychiatric Judgement and insight Intact.. No evidence of depression, anxiety, or agitation.. Notes the patient's wounds are looking very good today and minimal eschar was removed with moist saline gauze and minimal bleeding controlled with pressure Electronic Signature(s) Signed: 08/14/2016 9:29:31 AM By: Christin Fudge MD, FACS Entered By: Christin Fudge on 08/14/2016 09:29:31 [name] ([id number]) -------------------------------------------------------------------------------- Physician Orders Details Patient Name: [name] Date of Service: 08/14/2016 9:15  AM Medical Record Number: [id number] Patient Account Number: [id number] Date of Birth/Sex: May 28, 1945 (71 y.o. Male) Treating RN: Cornell Barman Primary Care Provider: Thomes Lolling Other Clinician: Referring Provider: Thomes Lolling Treating Provider/Extender: Frann Rider in Treatment: 6 Verbal / Phone Orders: No Diagnosis Coding Wound Cleansing Wound #1 Right Abdomen - midline o Clean wound with Normal Saline. o May Shower, gently pat wound dry prior to applying new dressing. Wound #2 Left Abdomen - midline o Clean wound with Normal Saline. o May Shower, gently pat wound dry prior to applying new dressing. Anesthetic Wound #1 Right Abdomen - midline o Topical Lidocaine 4% cream applied to wound bed prior to debridement Wound #2 Left Abdomen - midline o Topical Lidocaine 4% cream applied to wound bed prior to debridement Skin Barriers/Peri-Wound Care Wound #1 Right Abdomen - midline o Skin Prep Wound #2 Left Abdomen - midline o Skin Prep Primary Wound Dressing Wound #1 Right Abdomen - midline o Hydrafera Blue Wound #2 Left Abdomen - midline o Hydrafera Blue Secondary Dressing Wound #1 Right Abdomen - midline o Boardered Foam Dressing Wound #2 Left Abdomen - midline o Boardered Foam Dressing [name], [name] ([id number]) Dressing Change Frequency Wound #1 Right Abdomen - midline o Change dressing every other day. Wound #2 Left Abdomen - midline o Change dressing every other day. Follow-up Appointments Wound #1 Right Abdomen - midline o Return Appointment in 1 week. Wound #2 Left Abdomen - midline o Return Appointment in 1 week. Additional Orders / Instructions Wound #1 Right Abdomen - midline o Other: - Go see a Engineer, drilling about your hernias and get a surgical opinion. Wound #2 Left Abdomen - midline o Other: - Go see a Engineer, drilling about your hernias and get a surgical opinion. Electronic Signature(s) Signed: 08/14/2016 3:36:12  PM By: Christin Fudge MD, FACS Signed: 08/14/2016 3:42:03 PM By: Gretta Cool, BSN, RN, CWS, Kim RN, BSN Entered By: Gretta Cool, BSN, RN, CWS, Kim on 08/14/2016 09:26:26 [name], [name] ([id number]) -------------------------------------------------------------------------------- Problem List Details Patient Name: JAQUAIL, MCLEES Date of Service: 08/14/2016 9:15 AM Medical Record Number: [id number] Patient Account Number: [id number] Date of Birth/Sex: [birth year] (71 y.o. Male) Treating RN: Cornell Barman Primary  Care Provider: Thomes Lolling Other Clinician: Referring Provider: Thomes Lolling Treating Provider/Extender: Frann Rider in Treatment: 6 Active Problems ICD-10 Encounter Code Description Active Date Diagnosis E11.622 Type 2 diabetes mellitus with other skin ulcer 07/03/2016 Yes S31.100A Unspecified open wound of abdominal wall, right upper 07/03/2016 Yes quadrant without penetration into peritoneal cavity, initial encounter S31.102A Unspecified open wound of abdominal wall, epigastric 07/03/2016 Yes region without penetration into peritoneal cavity, initial encounter F17.228 Nicotine dependence, chewing tobacco, with other 07/03/2016 Yes nicotine-induced disorders Inactive Problems Resolved Problems Electronic Signature(s) Signed: 08/14/2016 9:28:28 AM By: Christin Fudge MD, FACS Entered By: Christin Fudge on 08/14/2016 42:35:36 [name] ([id number]) -------------------------------------------------------------------------------- Progress Note Details Patient Name: [name] Date of Service: 08/14/2016 9:15 AM Medical Record Number: [id number] Patient Account Number: [id number] Date of Birth/Sex: September 12, 1945 (71 y.o. Male) Treating RN: Cornell Barman Primary Care Provider: Thomes Lolling Other Clinician: Referring Provider: Thomes Lolling Treating Provider/Extender: Frann Rider in Treatment: 6 Subjective Chief Complaint Information obtained from Patient Patients presents  for treatment of an open diabetic ulcer to the anterior abdominal wall in the epigastric and right upper quadrant which she's had for over 5 years History of Present Illness (HPI) The following HPI elements were documented for the patient's wound: Location: abdomen wall wounds,-- right upper quadrant and epigastric region Quality: Patient reports experiencing a dull pain to affected area(s). Severity: Patient states wound are getting worse. Duration: Patient has had the wound for >5 year's prior to seeking treatment at the wound center Timing: Pain in wound is Intermittent (comes and goes Context: The wound appeared gradually over time Modifying Factors: Consults to this date include:not seen a surgeon and only sees his PCP for his medical complaints Associated Signs and Symptoms: Patient reports having increase discharge. 71 year old gentleman has been seen recently by his PCP Dr. Thomes Lolling, who sees him with a history of diabetes mellitus, peripheral neuropathy, B12 deficiency, hypertension, osteoarthritis and the patient had recent blood sugar checked which was 163. Past medical history includes essential hypertension, incisional hernia, diabetes mellitus, colon polyps, COPD, tobacco abuse in the past, chewing tobacco, GERD. He has also been treated for chronic back pain with no sciatica and is on oxycodone. His last hemoglobin A1c was 6.9% His abdominal surgery was done at [facility] over 15 years ago and gradually there was sutures which protruded and then there was mesh which protruded. He washes this during his shower but does not put any dressing and the wound is covered with a lot of debris. He has never had a surgical consultation for this. 07/10/2016 -- the patient has spoken to his PCP who said he was going to call me but I have not yet had a phone conversation with the physician. From what I understand the physician was reluctant to have a surgical opinion because of the  patient's several comorbidities and he feels that the surgery may be detrimental to his overall health 07/24/2016 -- the patient's daughter is at the bedside and we've had a detailed discussion regarding his options for a surgical opinion and possible surgical intervention before this becomes a emergent situation. She will seek primary care input and take the father to [facility] for a surgical opinion. 07/31/2016 -- the patient's daughter has organized for a surgical opinion at [facility] on June 14 08/14/2016 -- it has been about 3 weeks since he has stopped chewing tobacco [name], [name]. ([id number]) Objective Constitutional Pulse regular. Respirations normal and unlabored. Afebrile. Vitals  Time Taken: 9:10 AM, Height: 65 in, Weight: 161 lbs, BMI: 26.8, Temperature: 98.1 F, Pulse: 76 bpm, Respiratory Rate: 18 breaths/min, Blood Pressure: 142/80 mmHg. Eyes Nonicteric. Reactive to light. Ears, Nose, Mouth, and Throat Lips, teeth, and gums WNL.[name] Moist mucosa without lesions. Neck supple and nontender. No palpable supraclavicular or cervical adenopathy. Normal sized without goiter. Respiratory WNL. No retractions.. Breath sounds WNL, No rubs, rales, rhonchi, or wheeze.. Cardiovascular Heart rhythm and rate regular, no murmur or gallop.. Pedal Pulses WNL. No clubbing, cyanosis or edema. Chest Breasts symmetical and no nipple discharge.. Breast tissue WNL, no masses, lumps, or tenderness.. Lymphatic No adneopathy. No adenopathy. No adenopathy. Musculoskeletal Adexa without tenderness or enlargement.. Digits and nails w/o clubbing, cyanosis, infection, petechiae, ischemia, or inflammatory conditions.[name] Psychiatric Judgement and insight Intact.. No evidence of depression, anxiety, or agitation.. General Notes: the patient's wounds are looking very good today and minimal eschar was removed with moist saline gauze and minimal bleeding controlled with pressure Integumentary  (Hair, Skin) No suspicious lesions. No crepitus or fluctuance. No peri-wound warmth or erythema. No masses.[name] [name], [name] ([id number]) Wound #1 status is Open. Original cause of wound was Gradually Appeared. The wound is located on the Right Abdomen - midline. The wound measures 1.2cm length x 0.3cm width x 0.1cm depth; 0.283cm^2 area and 0.028cm^3 volume. The wound is limited to skin breakdown. There is no tunneling or undermining noted. There is a small amount of serous drainage noted. The wound margin is flat and intact. There is no granulation within the wound bed. There is a large (67-100%) amount of necrotic tissue within the wound bed including Eschar. The periwound skin appearance exhibited: Scarring. The periwound skin appearance did not exhibit: Callus, Crepitus, Excoriation, Induration, Rash, Dry/Scaly, Maceration, Atrophie Blanche, Cyanosis, Ecchymosis, Hemosiderin Staining, Mottled, Pallor, Rubor, Erythema. Periwound temperature was noted as No Abnormality. Wound #2 status is Open. Original cause of wound was Gradually Appeared. The wound is located on the Left Abdomen - midline. The wound measures 4cm length x 1cm width x 0.1cm depth; 3.142cm^2 area and 0.314cm^3 volume. The wound is limited to skin breakdown. There is no undermining noted, however, there is tunneling at 6:00 with a maximum distance of 0.6cm. There is a medium amount of serous drainage noted. The wound margin is flat and intact. There is large (67-100%) red granulation within the wound bed. There is a small (1-33%) amount of necrotic tissue within the wound bed including Eschar. The periwound skin appearance exhibited: Induration, Scarring, Ecchymosis. The periwound skin appearance did not exhibit: Callus, Crepitus, Excoriation, Rash, Dry/Scaly, Maceration, Atrophie Blanche, Cyanosis, Hemosiderin Staining, Mottled, Pallor, Rubor, Erythema. Periwound temperature was noted as No Abnormality. Assessment Active  Problems ICD-10 E11.622 - Type 2 diabetes mellitus with other skin ulcer S31.100A - Unspecified open wound of abdominal wall, right upper quadrant without penetration into peritoneal cavity, initial encounter S31.102A - Unspecified open wound of abdominal wall, epigastric region without penetration into peritoneal cavity, initial encounter F17.228 - Nicotine dependence, chewing tobacco, with other nicotine-induced disorders Plan Wound Cleansing: Wound #1 Right Abdomen - midline: Clean wound with Normal Saline. May Shower, gently pat wound dry prior to applying new dressing. Wound #2 Left Abdomen - midline: Clean wound with Normal Saline. May Shower, gently pat wound dry prior to applying new dressing. [name], [name] ([id number]) Anesthetic: Wound #1 Right Abdomen - midline: Topical Lidocaine 4% cream applied to wound bed prior to debridement Wound #2 Left Abdomen - midline: Topical Lidocaine 4% cream applied to wound bed  prior to debridement Skin Barriers/Peri-Wound Care: Wound #1 Right Abdomen - midline: Skin Prep Wound #2 Left Abdomen - midline: Skin Prep Primary Wound Dressing: Wound #1 Right Abdomen - midline: Hydrafera Blue Wound #2 Left Abdomen - midline: Hydrafera Blue Secondary Dressing: Wound #1 Right Abdomen - midline: Boardered Foam Dressing Wound #2 Left Abdomen - midline: Boardered Foam Dressing Dressing Change Frequency: Wound #1 Right Abdomen - midline: Change dressing every other day. Wound #2 Left Abdomen - midline: Change dressing every other day. Follow-up Appointments: Wound #1 Right Abdomen - midline: Return Appointment in 1 week. Wound #2 Left Abdomen - midline: Return Appointment in 1 week. Additional Orders / Instructions: Wound #1 Right Abdomen - midline: Other: - Go see a Engineer, drilling about your hernias and get a surgical opinion. Wound #2 Left Abdomen - midline: Other: - Go see a Engineer, drilling about your hernias and get a surgical opinion. I  have commended him on giving up chewing tobacco. I have recommended: 1. Hydrofera blue and a bordered foam to be applied daily after washing with soap and water. 2. he has an appointment to see a surgeon on June 14 and I am looking forward to this. 3. Good control of his diabetes mellitus 4. Adequate protein, vitamin A, vitamin C and zinc. [name], [name] ([id number]) Electronic Signature(s) Signed: 08/14/2016 9:30:28 AM By: Christin Fudge MD, FACS Entered By: Christin Fudge on 08/14/2016 09:30:28 [name] ([id number]) -------------------------------------------------------------------------------- SuperBill Details Patient Name: [name] Date of Service: 08/14/2016 Medical Record Number: [id number] Patient Account Number: [id number] Date of Birth/Sex: Aug 24, 1945 (71 y.o. Male) Treating RN: Cornell Barman Primary Care Provider: Thomes Lolling Other Clinician: Referring Provider: Thomes Lolling Treating Provider/Extender: Frann Rider in Treatment: 6 Diagnosis Coding ICD-10 Codes Code Description [phone] Type 2 diabetes mellitus with other skin ulcer Unspecified open wound of abdominal wall, right upper quadrant without penetration into S31.100A peritoneal cavity, initial encounter Unspecified open wound of abdominal wall, epigastric region without penetration into S31.102A peritoneal cavity, initial encounter F17.228 Nicotine dependence, chewing tobacco, with other nicotine-induced disorders Facility Procedures CPT4 Code: [id number] Description: 99213 - WOUND CARE VISIT-LEV 3 EST PT Modifier: Quantity: 1 Physician Procedures CPT4: Description Modifier Quantity Code [id number] 99213 - WC PHYS LEVEL 3 - EST PT 1 ICD-10 Description Diagnosis E11.622 Type 2 diabetes mellitus with other skin ulcer S31.100A Unspecified open wound of abdominal wall, right upper quadrant without  penetration into peritoneal cavity, initial encounter S31.102A Unspecified open wound of abdominal wall,  epigastric region without penetration into peritoneal cavity, initial encounter F17.228 Nicotine dependence, chewing tobacco, with other  nicotine-induced disorders Electronic Signature(s) Signed: 08/14/2016 9:30:57 AM By: Christin Fudge MD, FACS Entered By: Christin Fudge on 08/14/2016 09:30:56

## 2016-08-21 ENCOUNTER — Encounter: Payer: Medicare Other | Admitting: Surgery

## 2016-08-21 DIAGNOSIS — E11622 Type 2 diabetes mellitus with other skin ulcer: Secondary | ICD-10-CM | POA: Diagnosis not present

## 2016-08-22 NOTE — Progress Notes (Signed)
MEARL, OLVER ([id number]) Visit Report for 08/21/2016 Chief Complaint Document Details Patient Name: [name], [name]. Date of Service: 08/21/2016 9:45 AM Medical Record Number: [id number] Patient Account Number: [id number] Date of Birth/Sex: 1945/11/08 (71 y.o. Male) Treating RN: Ahmed Prima Primary Care Provider: Thomes Lolling Other Clinician: Referring Provider: Thomes Lolling Treating Provider/Extender: Frann Rider in Treatment: 7 Information Obtained from: Patient Chief Complaint Patients presents for treatment of an open diabetic ulcer to the anterior abdominal wall in the epigastric and right upper quadrant which she's had for over 5 years Electronic Signature(s) Signed: 08/21/2016 10:36:31 AM By: Christin Fudge MD, FACS Entered By: Christin Fudge on 08/21/2016 10:36:31 [name] ([id number]) -------------------------------------------------------------------------------- HPI Details Patient Name: [name] Date of Service: 08/21/2016 9:45 AM Medical Record Number: [id number] Patient Account Number: [id number] Date of Birth/Sex: December 25, 1945 (71 y.o. Male) Treating RN: Ahmed Prima Primary Care Provider: Thomes Lolling Other Clinician: Referring Provider: Thomes Lolling Treating Provider/Extender: Frann Rider in Treatment: 7 History of Present Illness Location: abdomen wall wounds,-- right upper quadrant and epigastric region Quality: Patient reports experiencing a dull pain to affected area(s). Severity: Patient states wound are getting worse. Duration: Patient has had the wound for >5 year's prior to seeking treatment at the wound center Timing: Pain in wound is Intermittent (comes and goes Context: The wound appeared gradually over time Modifying Factors: Consults to this date include:not seen a surgeon and only sees his PCP for his medical complaints Associated Signs and Symptoms: Patient reports having increase discharge. HPI Description:  71 year old gentleman has been seen recently by his PCP Dr. Thomes Lolling, who sees him with a history of diabetes mellitus, peripheral neuropathy, B12 deficiency, hypertension, osteoarthritis and the patient had recent blood sugar checked which was 163. Past medical history includes essential hypertension, incisional hernia, diabetes mellitus, colon polyps, COPD, tobacco abuse in the past, chewing tobacco, GERD. He has also been treated for chronic back pain with no sciatica and is on oxycodone. His last hemoglobin A1c was 6.9% His abdominal surgery was done at [facility] over 15 years ago and gradually there was sutures which protruded and then there was mesh which protruded. He washes this during his shower but does not put any dressing and the wound is covered with a lot of debris. He has never had a surgical consultation for this. 07/10/2016 -- the patient has spoken to his PCP who said he was going to call me but I have not yet had a phone conversation with the physician. From what I understand the physician was reluctant to have a surgical opinion because of the patient's several comorbidities and he feels that the surgery may be detrimental to his overall health 07/24/2016 -- the patient's daughter is at the bedside and we've had a detailed discussion regarding his options for a surgical opinion and possible surgical intervention before this becomes a emergent situation. She will seek primary care input and take the father to [facility] for a surgical opinion. 07/31/2016 -- the patient's daughter has organized for a surgical opinion at [facility] on June 14 08/14/2016 -- it has been about 3 weeks since he has stopped chewing tobacco Electronic Signature(s) Signed: 08/21/2016 10:36:35 AM By: Christin Fudge MD, FACS Entered By: Christin Fudge on 08/21/2016 10:36:35 [name]  ([id number]) -------------------------------------------------------------------------------- Physical Exam Details Patient Name: [name], [name] Date of Service: 08/21/2016 9:45 AM Medical Record Number: [id number] Patient Account Number: [id number] Date of Birth/Sex: 1945-04-21 (71 y.o. Male) Treating RN:  Carolyne Fiscal, Debi Primary Care Provider: Thomes Lolling Other Clinician: Referring Provider: Thomes Lolling Treating Provider/Extender: Frann Rider in Treatment: 7 Constitutional . Pulse regular. Respirations normal and unlabored. Afebrile. . Eyes Nonicteric. Reactive to light. Ears, Nose, Mouth, and Throat Lips, teeth, and gums WNL.[name] Moist mucosa without lesions. Neck supple and nontender. No palpable supraclavicular or cervical adenopathy. Normal sized without goiter. Respiratory WNL. No retractions.. Breath sounds WNL, No rubs, rales, rhonchi, or wheeze.. Cardiovascular Heart rhythm and rate regular, no murmur or gallop.. Pedal Pulses WNL. No clubbing, cyanosis or edema. Chest Breasts symmetical and no nipple discharge.. Breast tissue WNL, no masses, lumps, or tenderness.. Lymphatic No adneopathy. No adenopathy. No adenopathy. Musculoskeletal Adexa without tenderness or enlargement.. Digits and nails w/o clubbing, cyanosis, infection, petechiae, ischemia, or inflammatory conditions.. Integumentary (Hair, Skin) No suspicious lesions. No crepitus or fluctuance. No peri-wound warmth or erythema. No masses.[name] Psychiatric Judgement and insight Intact.. No evidence of depression, anxiety, or agitation.. Notes the wound on the left of his midline near the inferior aspect has a pocket of seropurulent material but the surrounding area has not in any way infected or show cellulitis. We will continue with local care and no sharp debridement was required today. Electronic Signature(s) Signed: 08/21/2016 10:37:06 AM By: Christin Fudge MD, FACS Entered By: Christin Fudge on 08/21/2016  10:37:06 [name] ([id number]) -------------------------------------------------------------------------------- Physician Orders Details Patient Name: [name], [name] Date of Service: 08/21/2016 9:45 AM Medical Record Number: [id number] Patient Account Number: [id number] Date of Birth/Sex: 1945-12-23 (71 y.o. Male) Treating RN: Carolyne Fiscal, Debi Primary Care Provider: Thomes Lolling Other Clinician: Referring Provider: Thomes Lolling Treating Provider/Extender: Frann Rider in Treatment: 7 Verbal / Phone Orders: Yes ClinicianCarolyne Fiscal, Debi Read Back and Verified: Yes Diagnosis Coding Wound Cleansing Wound #1 Right Abdomen - midline o Clean wound with Normal Saline. o May Shower, gently pat wound dry prior to applying new dressing. Wound #2 Left Abdomen - midline o Clean wound with Normal Saline. o May Shower, gently pat wound dry prior to applying new dressing. Anesthetic Wound #1 Right Abdomen - midline o Topical Lidocaine 4% cream applied to wound bed prior to debridement Wound #2 Left Abdomen - midline o Topical Lidocaine 4% cream applied to wound bed prior to debridement Skin Barriers/Peri-Wound Care Wound #1 Right Abdomen - midline o Skin Prep Wound #2 Left Abdomen - midline o Skin Prep Primary Wound Dressing Wound #1 Right Abdomen - midline o Hydrafera Blue Wound #2 Left Abdomen - midline o Hydrafera Blue Secondary Dressing Wound #1 Right Abdomen - midline o Boardered Foam Dressing Wound #2 Left Abdomen - midline o Boardered Foam Dressing [name], [name] ([id number]) Dressing Change Frequency Wound #1 Right Abdomen - midline o Change dressing every other day. Wound #2 Left Abdomen - midline o Change dressing every other day. Follow-up Appointments Wound #1 Right Abdomen - midline o Return Appointment in 1 week. Wound #2 Left Abdomen - midline o Return Appointment in 1 week. Additional Orders / Instructions Wound  #1 Right Abdomen - midline o Other: - Go see a Engineer, drilling about your hernias and get a surgical opinion. Wound #2 Left Abdomen - midline o Other: - Go see a Engineer, drilling about your hernias and get a surgical opinion. Electronic Signature(s) Signed: 08/21/2016 4:03:37 PM By: Christin Fudge MD, FACS Signed: 08/21/2016 4:39:14 PM By: Alric Quan Entered By: Alric Quan on 08/21/2016 10:24:54 [name] ([id number]) -------------------------------------------------------------------------------- Problem List Details Patient Name: [name], [name] Date of Service: 08/21/2016 9:45  AM Medical Record Number: [id number] Patient Account Number: [id number] Date of Birth/Sex: [birth year] (71 y.o. Male) Treating RN: Carolyne Fiscal, Debi Primary Care Provider: Thomes Lolling Other Clinician: Referring Provider: Thomes Lolling Treating Provider/Extender: Frann Rider in Treatment: 7 Active Problems ICD-10 Encounter Code Description Active Date Diagnosis E11.622 Type 2 diabetes mellitus with other skin ulcer 07/03/2016 Yes S31.100A Unspecified open wound of abdominal wall, right upper 07/03/2016 Yes quadrant without penetration into peritoneal cavity, initial encounter S31.102A Unspecified open wound of abdominal wall, epigastric 07/03/2016 Yes region without penetration into peritoneal cavity, initial encounter F17.228 Nicotine dependence, chewing tobacco, with other 07/03/2016 Yes nicotine-induced disorders Inactive Problems Resolved Problems Electronic Signature(s) Signed: 08/21/2016 10:36:20 AM By: Christin Fudge MD, FACS Entered By: Christin Fudge on 08/21/2016 10:36:20 [name] ([id number]) -------------------------------------------------------------------------------- Progress Note Details Patient Name: [name] Date of Service: 08/21/2016 9:45 AM Medical Record Number: [id number] Patient Account Number: [id number] Date of Birth/Sex: 02-Feb-1946 (71 y.o.  Male) Treating RN: Carolyne Fiscal, Debi Primary Care Provider: Thomes Lolling Other Clinician: Referring Provider: Thomes Lolling Treating Provider/Extender: Frann Rider in Treatment: 7 Subjective Chief Complaint Information obtained from Patient Patients presents for treatment of an open diabetic ulcer to the anterior abdominal wall in the epigastric and right upper quadrant which she's had for over 5 years History of Present Illness (HPI) The following HPI elements were documented for the patient's wound: Location: abdomen wall wounds,-- right upper quadrant and epigastric region Quality: Patient reports experiencing a dull pain to affected area(s). Severity: Patient states wound are getting worse. Duration: Patient has had the wound for >5 year's prior to seeking treatment at the wound center Timing: Pain in wound is Intermittent (comes and goes Context: The wound appeared gradually over time Modifying Factors: Consults to this date include:not seen a surgeon and only sees his PCP for his medical complaints Associated Signs and Symptoms: Patient reports having increase discharge. 71 year old gentleman has been seen recently by his PCP Dr. Thomes Lolling, who sees him with a history of diabetes mellitus, peripheral neuropathy, B12 deficiency, hypertension, osteoarthritis and the patient had recent blood sugar checked which was 163. Past medical history includes essential hypertension, incisional hernia, diabetes mellitus, colon polyps, COPD, tobacco abuse in the past, chewing tobacco, GERD. He has also been treated for chronic back pain with no sciatica and is on oxycodone. His last hemoglobin A1c was 6.9% His abdominal surgery was done at [facility] over 15 years ago and gradually there was sutures which protruded and then there was mesh which protruded. He washes this during his shower but does not put any dressing and the wound is covered with a lot of debris. He has never had a  surgical consultation for this. 07/10/2016 -- the patient has spoken to his PCP who said he was going to call me but I have not yet had a phone conversation with the physician. From what I understand the physician was reluctant to have a surgical opinion because of the patient's several comorbidities and he feels that the surgery may be detrimental to his overall health 07/24/2016 -- the patient's daughter is at the bedside and we've had a detailed discussion regarding his options for a surgical opinion and possible surgical intervention before this becomes a emergent situation. She will seek primary care input and take the father to [facility] for a surgical opinion. 07/31/2016 -- the patient's daughter has organized for a surgical opinion at [facility] on June 14 08/14/2016 -- it has been about 3  weeks since he has stopped chewing tobacco [name], [name]. ([id number]) Objective Constitutional Pulse regular. Respirations normal and unlabored. Afebrile. Vitals Time Taken: 9:46 AM, Height: 65 in, Weight: 161 lbs, BMI: 26.8, Temperature: 97.7 F, Pulse: 92 bpm, Respiratory Rate: 18 breaths/min, Blood Pressure: 147/69 mmHg. Eyes Nonicteric. Reactive to light. Ears, Nose, Mouth, and Throat Lips, teeth, and gums WNL.[name] Moist mucosa without lesions. Neck supple and nontender. No palpable supraclavicular or cervical adenopathy. Normal sized without goiter. Respiratory WNL. No retractions.. Breath sounds WNL, No rubs, rales, rhonchi, or wheeze.. Cardiovascular Heart rhythm and rate regular, no murmur or gallop.. Pedal Pulses WNL. No clubbing, cyanosis or edema. Chest Breasts symmetical and no nipple discharge.. Breast tissue WNL, no masses, lumps, or tenderness.. Lymphatic No adneopathy. No adenopathy. No adenopathy. Musculoskeletal Adexa without tenderness or enlargement.. Digits and nails w/o clubbing, cyanosis, infection, petechiae, ischemia, or inflammatory  conditions.[name] Psychiatric Judgement and insight Intact.. No evidence of depression, anxiety, or agitation.. General Notes: the wound on the left of his midline near the inferior aspect has a pocket of seropurulent material but the surrounding area has not in any way infected or show cellulitis. We will continue with local care and no sharp debridement was required today. Integumentary (Hair, Skin) No suspicious lesions. No crepitus or fluctuance. No peri-wound warmth or erythema. No masses.[name] [name], [name] ([id number]) Wound #1 status is Open. Original cause of wound was Gradually Appeared. The wound is located on the Right Abdomen - midline. The wound measures 1.2cm length x 0.3cm width x 0.1cm depth; 0.283cm^2 area and 0.028cm^3 volume. The wound is limited to skin breakdown. There is no tunneling or undermining noted. There is a medium amount of serous drainage noted. The wound margin is flat and intact. There is no granulation within the wound bed. There is a large (67-100%) amount of necrotic tissue within the wound bed including Eschar. The periwound skin appearance exhibited: Scarring. The periwound skin appearance did not exhibit: Callus, Crepitus, Excoriation, Induration, Rash, Dry/Scaly, Maceration, Atrophie Blanche, Cyanosis, Ecchymosis, Hemosiderin Staining, Mottled, Pallor, Rubor, Erythema. Periwound temperature was noted as No Abnormality. Wound #2 status is Open. Original cause of wound was Gradually Appeared. The wound is located on the Left Abdomen - midline. The wound measures 4cm length x 1cm width x 0.1cm depth; 3.142cm^2 area and 0.314cm^3 volume. The wound is limited to skin breakdown. There is no tunneling or undermining noted. There is a large amount of purulent drainage noted. The wound margin is flat and intact. There is medium (34-66%) red granulation within the wound bed. There is a medium (34-66%) amount of necrotic tissue within the wound bed including Eschar and  Adherent Slough. The periwound skin appearance exhibited: Induration, Scarring, Ecchymosis. The periwound skin appearance did not exhibit: Callus, Crepitus, Excoriation, Rash, Dry/Scaly, Maceration, Atrophie Blanche, Cyanosis, Hemosiderin Staining, Mottled, Pallor, Rubor, Erythema. Periwound temperature was noted as No Abnormality. Assessment Active Problems ICD-10 E11.622 - Type 2 diabetes mellitus with other skin ulcer S31.100A - Unspecified open wound of abdominal wall, right upper quadrant without penetration into peritoneal cavity, initial encounter S31.102A - Unspecified open wound of abdominal wall, epigastric region without penetration into peritoneal cavity, initial encounter F17.228 - Nicotine dependence, chewing tobacco, with other nicotine-induced disorders Plan Wound Cleansing: Wound #1 Right Abdomen - midline: Clean wound with Normal Saline. May Shower, gently pat wound dry prior to applying new dressing. Wound #2 Left Abdomen - midline: Clean wound with Normal Saline. May Shower, gently pat wound dry prior to applying new dressing. [name],  [name] ([id number]) Anesthetic: Wound #1 Right Abdomen - midline: Topical Lidocaine 4% cream applied to wound bed prior to debridement Wound #2 Left Abdomen - midline: Topical Lidocaine 4% cream applied to wound bed prior to debridement Skin Barriers/Peri-Wound Care: Wound #1 Right Abdomen - midline: Skin Prep Wound #2 Left Abdomen - midline: Skin Prep Primary Wound Dressing: Wound #1 Right Abdomen - midline: Hydrafera Blue Wound #2 Left Abdomen - midline: Hydrafera Blue Secondary Dressing: Wound #1 Right Abdomen - midline: Boardered Foam Dressing Wound #2 Left Abdomen - midline: Boardered Foam Dressing Dressing Change Frequency: Wound #1 Right Abdomen - midline: Change dressing every other day. Wound #2 Left Abdomen - midline: Change dressing every other day. Follow-up Appointments: Wound #1 Right Abdomen -  midline: Return Appointment in 1 week. Wound #2 Left Abdomen - midline: Return Appointment in 1 week. Additional Orders / Instructions: Wound #1 Right Abdomen - midline: Other: - Go see a Engineer, drilling about your hernias and get a surgical opinion. Wound #2 Left Abdomen - midline: Other: - Go see a Engineer, drilling about your hernias and get a surgical opinion. he has his surgical appointment later this week and I have recommended: 1. Hydrofera blue and a bordered foam to be applied daily after washing with soap and water. 2. he has an appointment to see a surgeon on June 14 and I am looking forward to this. 3. Good control of his diabetes mellitus 4. Adequate protein, vitamin A, vitamin C and zinc. Electronic Signature(s) [name], [name] ([id number]) Signed: 08/21/2016 10:37:49 AM By: Christin Fudge MD, FACS Entered By: Christin Fudge on 08/21/2016 10:37:49 [name] ([id number]) -------------------------------------------------------------------------------- SuperBill Details Patient Name: [name] Date of Service: 08/21/2016 Medical Record Number: [id number] Patient Account Number: [id number] Date of Birth/Sex: 01-31-1946 (71 y.o. Male) Treating RN: Carolyne Fiscal, Debi Primary Care Provider: Thomes Lolling Other Clinician: Referring Provider: Thomes Lolling Treating Provider/Extender: Frann Rider in Treatment: 7 Diagnosis Coding ICD-10 Codes Code Description E11.622 Type 2 diabetes mellitus with other skin ulcer Unspecified open wound of abdominal wall, right upper quadrant without penetration into S31.100A peritoneal cavity, initial encounter Unspecified open wound of abdominal wall, epigastric region without penetration into S31.102A peritoneal cavity, initial encounter F17.228 Nicotine dependence, chewing tobacco, with other nicotine-induced disorders Facility Procedures CPT4 Code: [id number] Description: 02542 - WOUND CARE VISIT-LEV 3 EST PT Modifier: Quantity:  1 Physician Procedures CPT4: Description Modifier Quantity Code 7062376 28315 - WC PHYS LEVEL 3 - EST PT 1 ICD-10 Description Diagnosis E11.622 Type 2 diabetes mellitus with other skin ulcer S31.100A Unspecified open wound of abdominal wall, right upper quadrant without  penetration into peritoneal cavity, initial encounter S31.102A Unspecified open wound of abdominal wall, epigastric region without penetration into peritoneal cavity, initial encounter Electronic Signature(s) Signed: 08/21/2016 4:03:37 PM By: Christin Fudge MD, FACS Signed: 08/21/2016 4:39:14 PM By: Alric Quan Previous Signature: 08/21/2016 10:38:02 AM Version By: Christin Fudge MD, FACS Entered By: Alric Quan on 08/21/2016 12:32:06

## 2016-08-22 NOTE — Progress Notes (Signed)
[name], [name] ([id number]) Visit Report for 08/21/2016 Arrival Information Details Patient Name: [name], [name]. Date of Service: 08/21/2016 9:45 AM Medical Record Number: [id number] Patient Account Number: [id number] Date of Birth/Sex: Nov 13, 1945 (71 y.o. Male) Treating RN: Ahmed Prima Primary Care Soila Printup: Thomes Lolling Other Clinician: Referring Colsen Modi: Thomes Lolling Treating Jeovany Huitron/Extender: Frann Rider in Treatment: 7 Visit Information History Since Last Visit All ordered tests and consults were completed: No Patient Arrived: Ambulatory Added or deleted any medications: No Arrival Time: 09:44 Any new allergies or adverse reactions: No Accompanied By: self Had a fall or experienced change in No Transfer Assistance: None activities of daily living that may affect Patient Identification Verified: Yes risk of falls: Secondary Verification Process Yes Signs or symptoms of abuse/neglect since last No Completed: visito Patient Requires Transmission-Based No Hospitalized since last visit: No Precautions: Has Dressing in Place as Prescribed: Yes Patient Has Alerts: Yes Pain Present Now: No Patient Alerts: DMII Electronic Signature(s) Signed: 08/21/2016 4:39:14 PM By: Alric Quan Entered By: Alric Quan on 08/21/2016 09:46:32 [name] ([id number]) -------------------------------------------------------------------------------- Clinic Level of Care Assessment Details Patient Name: [name] Date of Service: 08/21/2016 9:45 AM Medical Record Number: [id number] Patient Account Number: [id number] Date of Birth/Sex: 05-Nov-1945 (71 y.o. Male) Treating RN: Carolyne Fiscal, Debi Primary Care Jessie Schrieber: Thomes Lolling Other Clinician: Referring Thelonious Kauffmann: Thomes Lolling Treating Areej Tayler/Extender: Frann Rider in Treatment: 7 Clinic Level of Care Assessment Items TOOL 4 Quantity Score X - Use when only an EandM is performed on FOLLOW-UP visit 1  0 ASSESSMENTS - Nursing Assessment / Reassessment X - Reassessment of Co-morbidities (includes updates in patient status) 1 10 X - Reassessment of Adherence to Treatment Plan 1 5 ASSESSMENTS - Wound and Skin Assessment / Reassessment []  - Simple Wound Assessment / Reassessment - one wound 0 X - Complex Wound Assessment / Reassessment - multiple wounds 2 5 []  - Dermatologic / Skin Assessment (not related to wound area) 0 ASSESSMENTS - Focused Assessment []  - Circumferential Edema Measurements - multi extremities 0 []  - Nutritional Assessment / Counseling / Intervention 0 []  - Lower Extremity Assessment (monofilament, tuning fork, pulses) 0 []  - Peripheral Arterial Disease Assessment (using hand held doppler) 0 ASSESSMENTS - Ostomy and/or Continence Assessment and Care []  - Incontinence Assessment and Management 0 []  - Ostomy Care Assessment and Management (repouching, etc.) 0 PROCESS - Coordination of Care X - Simple Patient / Family Education for ongoing care 1 15 []  - Complex (extensive) Patient / Family Education for ongoing care 0 []  - Staff obtains Programmer, systems, Records, Test Results / Process Orders 0 []  - Staff telephones HHA, Nursing Homes / Clarify orders / etc 0 []  - Routine Transfer to another Facility (non-emergent condition) 0 [name], [name] ([id number]) []  - Routine Hospital Admission (non-emergent condition) 0 []  - New Admissions / Biomedical engineer / Ordering NPWT, Apligraf, etc. 0 []  - Emergency Hospital Admission (emergent condition) 0 X - Simple Discharge Coordination 1 10 []  - Complex (extensive) Discharge Coordination 0 PROCESS - Special Needs []  - Pediatric / Minor Patient Management 0 []  - Isolation Patient Management 0 []  - Hearing / Language / Visual special needs 0 []  - Assessment of Community assistance (transportation, D/C planning, etc.) 0 []  - Additional assistance / Altered mentation 0 []  - Support Surface(s) Assessment (bed, cushion, seat, etc.)  0 INTERVENTIONS - Wound Cleansing / Measurement []  - Simple Wound Cleansing - one wound 0 X - Complex Wound Cleansing - multiple wounds 2 5 X - Wound  Imaging (photographs - any number of wounds) 1 5 []  - Wound Tracing (instead of photographs) 0 []  - Simple Wound Measurement - one wound 0 X - Complex Wound Measurement - multiple wounds 2 5 INTERVENTIONS - Wound Dressings X - Small Wound Dressing one or multiple wounds 2 10 []  - Medium Wound Dressing one or multiple wounds 0 []  - Large Wound Dressing one or multiple wounds 0 X - Application of Medications - topical 1 5 []  - Application of Medications - injection 0 INTERVENTIONS - Miscellaneous []  - External ear exam 0 [name], [name] ([id number]) []  - Specimen Collection (cultures, biopsies, blood, body fluids, etc.) 0 []  - Specimen(s) / Culture(s) sent or taken to Lab for analysis 0 []  - Patient Transfer (multiple staff / Harrel Lemon Lift / Similar devices) 0 []  - Simple Staple / Suture removal (25 or less) 0 []  - Complex Staple / Suture removal (26 or more) 0 []  - Hypo / Hyperglycemic Management (close monitor of Blood Glucose) 0 []  - Ankle / Brachial Index (ABI) - do not check if billed separately 0 X - Vital Signs 1 5 Has the patient been seen at the hospital within the last three years: Yes Total Score: 105 Level Of Care: New/Established - Level 3 Electronic Signature(s) Signed: 08/21/2016 4:39:14 PM By: Alric Quan Entered By: Alric Quan on 08/21/2016 12:31:57 [name] ([id number]) -------------------------------------------------------------------------------- Encounter Discharge Information Details Patient Name: [name] Date of Service: 08/21/2016 9:45 AM Medical Record Number: [id number] Patient Account Number: [id number] Date of Birth/Sex: April 11, 1945 (71 y.o. Male) Treating RN: Ahmed Prima Primary Care Ozro Russett: Thomes Lolling Other Clinician: Referring Dilara Navarrete: Thomes Lolling Treating  Demoni Parmar/Extender: Frann Rider in Treatment: 7 Encounter Discharge Information Items Discharge Pain Level: 0 Discharge Condition: Stable Ambulatory Status: Ambulatory Discharge Destination: Home Transportation: Private Auto Accompanied By: self Schedule Follow-up Appointment: Yes Medication Reconciliation completed and provided to Patient/Care No Shauna Bodkins: Provided on Clinical Summary of Care: 08/21/2016 Form Type Recipient Paper Patient FB Electronic Signature(s) Signed: 08/21/2016 10:32:19 AM By: [name] Entered By: [name] on 08/21/2016 10:32:19 [name] ([id number]) -------------------------------------------------------------------------------- Lower Extremity Assessment Details Patient Name: [name] Date of Service: 08/21/2016 9:45 AM Medical Record Number: [id number] Patient Account Number: [id number] Date of Birth/Sex: 07/18/1945 (72 y.o. Male) Treating RN: Ahmed Prima Primary Care Dacey Milberger: Thomes Lolling Other Clinician: Referring Haruki Arnold: Thomes Lolling Treating Emrah Ariola/Extender: Frann Rider in Treatment: 7 Electronic Signature(s) Signed: 08/21/2016 4:39:14 PM By: Alric Quan Entered By: Alric Quan on 08/21/2016 10:03:32 [name] ([id number]) -------------------------------------------------------------------------------- Multi Wound Chart Details Patient Name: [name] Date of Service: 08/21/2016 9:45 AM Medical Record Number: [id number] Patient Account Number: [id number] Date of Birth/Sex: 1946-01-22 (71 y.o. Male) Treating RN: Carolyne Fiscal, Debi Primary Care Fareeha Evon: Thomes Lolling Other Clinician: Referring Pinki Rottman: Thomes Lolling Treating Harlee Pursifull/Extender: Frann Rider in Treatment: 7 Vital Signs Height(in): 65 Pulse(bpm): 92 Weight(lbs): 161 Blood Pressure 147/69 (mmHg): Body Mass Index(BMI): 27 Temperature(F): 97.7 Respiratory Rate 18 (breaths/min): Photos: [1:No  Photos] [2:No Photos] [N/A:N/A] Wound Location: [1:Right Abdomen - midline] [2:Left Abdomen - midline] [N/A:N/A] Wounding Event: [1:Gradually Appeared] [2:Gradually Appeared] [N/A:N/A] Primary Etiology: [1:Atypical] [2:Atypical] [N/A:N/A] Comorbid History: [1:Cataracts, Chronic Obstructive Pulmonary Disease (COPD), Hypertension, Type II Diabetes, Osteoarthritis, Neuropathy] [2:Cataracts, Chronic Obstructive Pulmonary Disease (COPD), Hypertension, Type II Diabetes, Osteoarthritis,  Neuropathy] [N/A:N/A] Date Acquired: [1:05/17/2015] [2:05/17/2015] [N/A:N/A] Weeks of Treatment: [1:7] [2:7] [N/A:N/A] Wound Status: [1:Open] [2:Open] [N/A:N/A] Measurements L x W x D 1.2x0.3x0.1 [2:4x1x0.1] [N/A:N/A] (cm) Area (cm) : [1:0.283] [2:3.142] [  N/A:N/A] Volume (cm) : [1:0.028] [2:0.314] [N/A:N/A] % Reduction in Area: [1:91.80%] [2:40.50%] [N/A:N/A] % Reduction in Volume: 91.80% [2:40.50%] [N/A:N/A] Classification: [1:Full Thickness Without Exposed Support Structures] [2:Full Thickness Without Exposed Support Structures] [N/A:N/A] Exudate Amount: [1:Medium] [2:Large] [N/A:N/A] Exudate Type: [1:Serous] [2:Purulent] [N/A:N/A] Exudate Color: [1:amber] [2:yellow, Kubly, green] [N/A:N/A] Wound Margin: [1:Flat and Intact] [2:Flat and Intact] [N/A:N/A] Granulation Amount: [1:None Present (0%)] [2:Medium (34-66%)] [N/A:N/A] Granulation Quality: [1:N/A] [2:Red] [N/A:N/A] Necrotic Amount: [1:Large (67-100%)] [2:Medium (34-66%)] [N/A:N/A] Necrotic Tissue: [1:Eschar] [2:Eschar, Adherent Slough] [N/A:N/A] Exposed Structures: Fascia: No Fascia: No N/A Fat Layer (Subcutaneous Fat Layer (Subcutaneous Tissue) Exposed: No Tissue) Exposed: No Tendon: No Tendon: No Muscle: No Muscle: No Joint: No Joint: No Bone: No Bone: No Limited to Skin Limited to Skin Breakdown Breakdown Epithelialization: Large (67-100%) Small (1-33%) N/A Periwound Skin Texture: Scarring: Yes Induration: Yes N/A Excoriation:  No Scarring: Yes Induration: No Excoriation: No Callus: No Callus: No Crepitus: No Crepitus: No Rash: No Rash: No Periwound Skin Maceration: No Maceration: No N/A Moisture: Dry/Scaly: No Dry/Scaly: No Periwound Skin Color: Atrophie Blanche: No Ecchymosis: Yes N/A Cyanosis: No Atrophie Blanche: No Ecchymosis: No Cyanosis: No Erythema: No Erythema: No Hemosiderin Staining: No Hemosiderin Staining: No Mottled: No Mottled: No Pallor: No Pallor: No Rubor: No Rubor: No Temperature: No Abnormality No Abnormality N/A Tenderness on No No N/A Palpation: Wound Preparation: Ulcer Cleansing: Ulcer Cleansing: N/A Rinsed/Irrigated with Rinsed/Irrigated with Saline Saline Topical Anesthetic Topical Anesthetic Applied: Other: lidocaine Applied: Other: lidocaine 4% 4% Treatment Notes Wound #1 (Right Abdomen - midline) 1. Cleansed with: Clean wound with Normal Saline 2. Anesthetic Topical Lidocaine 4% cream to wound bed prior to debridement 3. Peri-wound Care: Skin Prep 4. Dressing Applied: Hydrafera Blue 5. Secondary [place], Longfellow ([id number]) Wound #2 (Left Abdomen - midline) 1. Cleansed with: Clean wound with Normal Saline 2. Anesthetic Topical Lidocaine 4% cream to wound bed prior to debridement 3. Peri-wound Care: Skin Prep 4. Dressing Applied: Hydrafera Blue 5. Secondary [place] Signature(s) Signed: 08/21/2016 10:36:25 AM By: Christin Fudge MD, FACS Entered By: Christin Fudge on 08/21/2016 10:36:25 [name] ([id number]) -------------------------------------------------------------------------------- [place] Details Patient Name: [name], [name] Date of Service: 08/21/2016 9:45 AM Medical Record Number: [id number] Patient Account Number: [id number] Date of Birth/Sex: May 21, 1945 (71 y.o. Male) Treating RN: Ahmed Prima Primary Care Shanayah Kaffenberger: Thomes Lolling Other  Clinician: Referring Gennett Garcia: Thomes Lolling Treating Filomena Pokorney/Extender: Frann Rider in Treatment: 7 Active Inactive ` Abuse / Safety / Falls / Self Care Management Nursing Diagnoses: Impaired physical mobility Goals: Patient will remain injury free Date Initiated: 07/03/2016 Target Resolution Date: 09/14/2016 Goal Status: Active Interventions: Assess fall risk on admission and as needed Notes: ` Orientation to the Wound Care Program Nursing Diagnoses: Knowledge deficit related to the wound healing center program Goals: Patient/caregiver will verbalize understanding of the [place] Date Initiated: 07/03/2016 Target Resolution Date: 09/15/2016 Goal Status: Active Interventions: Provide education on orientation to the wound center Notes: ` Wound/Skin Impairment Nursing Diagnoses: Impaired tissue integrity [name], [name] ([id number]) Goals: Patient/caregiver will verbalize understanding of skin care regimen Date Initiated: 07/03/2016 Target Resolution Date: 09/15/2016 Goal Status: Active Ulcer/skin breakdown will have a volume reduction of 30% by week 4 Date Initiated: 07/03/2016 Target Resolution Date: 09/15/2016 Goal Status: Active Ulcer/skin breakdown will have a volume reduction of 50% by week 8 Date Initiated: 07/03/2016 Target Resolution Date: 09/15/2016 Goal Status: Active Ulcer/skin breakdown will have a volume reduction of 80% by week  12 Date Initiated: 07/03/2016 Target Resolution Date: 09/15/2016 Goal Status: Active Ulcer/skin breakdown will heal within 14 weeks Date Initiated: 07/03/2016 Target Resolution Date: 09/15/2016 Goal Status: Active Interventions: Assess patient/caregiver ability to obtain necessary supplies Assess patient/caregiver ability to perform ulcer/skin care regimen upon admission and as needed Assess ulceration(s) every visit Notes: Electronic Signature(s) Signed: 08/21/2016 4:39:14 PM By: Alric Quan Entered By:  Alric Quan on 08/21/2016 10:03:38 [name] ([id number]) -------------------------------------------------------------------------------- Pain Assessment Details Patient Name: [name] Date of Service: 08/21/2016 9:45 AM Medical Record Number: [id number] Patient Account Number: [id number] Date of Birth/Sex: 02/01/1946 (71 y.o. Male) Treating RN: Ahmed Prima Primary Care Alece Koppel: Thomes Lolling Other Clinician: Referring Adrinne Sze: Thomes Lolling Treating Polk Minor/Extender: Frann Rider in Treatment: 7 Active Problems Location of Pain Severity and Description of Pain Patient Has Paino No Site Locations With Dressing Change: No Pain Management and Medication Current Pain Management: Electronic Signature(s) Signed: 08/21/2016 4:39:14 PM By: Alric Quan Entered By: Alric Quan on 08/21/2016 09:46:38 [name] ([id number]) -------------------------------------------------------------------------------- Patient/Caregiver Education Details Patient Name: [name] Date of Service: 08/21/2016 9:45 AM Medical Record Number: [id number] Patient Account Number: [id number] Date of Birth/Gender: [birth year] (71 y.o. Male) Treating RN: Ahmed Prima Primary Care Physician: Thomes Lolling Other Clinician: Referring Physician: Thomes Lolling Treating Physician/Extender: Frann Rider in Treatment: 7 Education Assessment Education Provided To: Patient Education Topics Provided Wound/Skin Impairment: Handouts: Other: change dressing as ordered Methods: Demonstration, Explain/Verbal Responses: State content correctly Electronic Signature(s) Signed: 08/21/2016 4:39:14 PM By: Alric Quan Entered By: Alric Quan on 08/21/2016 10:25:55 [name] ([id number]) -------------------------------------------------------------------------------- Wound Assessment Details Patient Name: [name] Date of Service: 08/21/2016 9:45  AM Medical Record Number: [id number] Patient Account Number: [id number] Date of Birth/Sex: October 24, 1945 (71 y.o. Male) Treating RN: Carolyne Fiscal, Debi Primary Care Kyllian Clingerman: Thomes Lolling Other Clinician: Referring Tylisha Danis: Thomes Lolling Treating Daschel Roughton/Extender: Frann Rider in Treatment: 7 Wound Status Wound Number: 1 Primary Atypical Etiology: Wound Location: Right Abdomen - midline Wound Open Wounding Event: Gradually Appeared Status: Date Acquired: 05/17/2015 Comorbid Cataracts, Chronic Obstructive Weeks Of Treatment: 7 History: Pulmonary Disease (COPD), Clustered Wound: No Hypertension, Type II Diabetes, Osteoarthritis, Neuropathy Photos Photo Uploaded By: Alric Quan on 08/21/2016 16:33:00 Wound Measurements Length: (cm) 1.2 Width: (cm) 0.3 Depth: (cm) 0.1 Area: (cm) 0.283 Volume: (cm) 0.028 % Reduction in Area: 91.8% % Reduction in Volume: 91.8% Epithelialization: Large (67-100%) Tunneling: No Undermining: No Wound Description Full Thickness Without Exposed Classification: Support Structures Wound Margin: Flat and Intact Exudate Medium Amount: Exudate Type: Serous Exudate Color: amber Foul Odor After Cleansing: No Slough/Fibrino No Wound Bed Granulation Amount: None Present (0%) Exposed Structure [name], [name] ([id number]) Necrotic Amount: Large (67-100%) Fascia Exposed: No Necrotic Quality: Eschar Fat Layer (Subcutaneous Tissue) Exposed: No Tendon Exposed: No Muscle Exposed: No Joint Exposed: No Bone Exposed: No Limited to Skin Breakdown Periwound Skin Texture Texture Color No Abnormalities Noted: No No Abnormalities Noted: No Callus: No Atrophie Blanche: No Crepitus: No Cyanosis: No Excoriation: No Ecchymosis: No Induration: No Erythema: No Rash: No Hemosiderin Staining: No Scarring: Yes Mottled: No Pallor: No Moisture Rubor: No No Abnormalities Noted: No Dry / Scaly: No Temperature / Pain Maceration:  No Temperature: No Abnormality Wound Preparation Ulcer Cleansing: Rinsed/Irrigated with Saline Topical Anesthetic Applied: Other: lidocaine 4%, Treatment Notes Wound #1 (Right Abdomen - midline) 1. Cleansed with: Clean wound with Normal Saline 2. Anesthetic Topical Lidocaine 4% cream to wound bed prior to debridement 3. Peri-wound Care: Skin Prep 4. Dressing Applied: Hydrafera  Blue 5. Secondary [place] Signature(s) Signed: 08/21/2016 4:39:14 PM By: Alric Quan Entered By: Alric Quan on 08/21/2016 09:51:05 [name] ([id number]) -------------------------------------------------------------------------------- Wound Assessment Details Patient Name: [name], [name] Date of Service: 08/21/2016 9:45 AM Medical Record Number: [id number] Patient Account Number: [id number] Date of Birth/Sex: 05/20/1945 (71 y.o. Male) Treating RN: Carolyne Fiscal, Debi Primary Care Patrcia Schnepp: Thomes Lolling Other Clinician: Referring Karah Caruthers: Thomes Lolling Treating Mairin Lindsley/Extender: Frann Rider in Treatment: 7 Wound Status Wound Number: 2 Primary Atypical Etiology: Wound Location: Left Abdomen - midline Wound Open Wounding Event: Gradually Appeared Status: Date Acquired: 05/17/2015 Comorbid Cataracts, Chronic Obstructive Weeks Of Treatment: 7 History: Pulmonary Disease (COPD), Clustered Wound: No Hypertension, Type II Diabetes, Osteoarthritis, Neuropathy Photos Photo Uploaded By: Alric Quan on 08/21/2016 16:33:00 Wound Measurements Length: (cm) 4 Width: (cm) 1 Depth: (cm) 0.1 Area: (cm) 3.142 Volume: (cm) 0.314 % Reduction in Area: 40.5% % Reduction in Volume: 40.5% Epithelialization: Small (1-33%) Tunneling: No Undermining: No Wound Description Full Thickness Without Exposed Foul Odor Afte Classification: Support Structures Slough/Fibrino Wound Margin: Flat and Intact Exudate Large Amount: Exudate Type:  Purulent Exudate Color: yellow, [place], green r Cleansing: No No Wound Bed Granulation Amount: Medium (34-66%) Exposed Structure [name], [name] ([id number]) Granulation Quality: Red Fascia Exposed: No Necrotic Amount: Medium (34-66%) Fat Layer (Subcutaneous Tissue) Exposed: No Necrotic Quality: Eschar, Adherent Slough Tendon Exposed: No Muscle Exposed: No Joint Exposed: No Bone Exposed: No Limited to Skin Breakdown Periwound Skin Texture Texture Color No Abnormalities Noted: No No Abnormalities Noted: No Callus: No Atrophie Blanche: No Crepitus: No Cyanosis: No Excoriation: No Ecchymosis: Yes Induration: Yes Erythema: No Rash: No Hemosiderin Staining: No Scarring: Yes Mottled: No Pallor: No Moisture Rubor: No No Abnormalities Noted: No Dry / Scaly: No Temperature / Pain Maceration: No Temperature: No Abnormality Wound Preparation Ulcer Cleansing: Rinsed/Irrigated with Saline Topical Anesthetic Applied: Other: lidocaine 4%, Treatment Notes Wound #2 (Left Abdomen - midline) 1. Cleansed with: Clean wound with Normal Saline 2. Anesthetic Topical Lidocaine 4% cream to wound bed prior to debridement 3. Peri-wound Care: Skin Prep 4. Dressing Applied: Hydrafera Blue 5. Secondary [place] Signature(s) Signed: 08/21/2016 4:39:14 PM By: Alric Quan Entered By: Alric Quan on 08/21/2016 09:50:43 [name] ([id number]) -------------------------------------------------------------------------------- [place] Details Patient Name: [name] Date of Service: 08/21/2016 9:45 AM Medical Record Number: [id number] Patient Account Number: [id number] Date of Birth/Sex: July 05, 1945 (71 y.o. Male) Treating RN: Carolyne Fiscal, Debi Primary Care Yarianna Varble: Thomes Lolling Other Clinician: Referring Liberti Appleton: Thomes Lolling Treating Depaul Arizpe/Extender: Frann Rider in Treatment: 7 Vital Signs Time Taken: 09:46 Temperature  (F): 97.7 Height (in): 65 Pulse (bpm): 92 Weight (lbs): 161 Respiratory Rate (breaths/min): 18 Body Mass Index (BMI): 26.8 Blood Pressure (mmHg): 147/69 Reference Range: 80 - 120 mg / dl Electronic Signature(s) Signed: 08/21/2016 4:39:14 PM By: Alric Quan Entered By: Alric Quan on 08/21/2016 09:46:59

## 2016-08-28 ENCOUNTER — Encounter: Payer: Medicare Other | Admitting: Surgery

## 2016-08-28 DIAGNOSIS — E11622 Type 2 diabetes mellitus with other skin ulcer: Secondary | ICD-10-CM | POA: Diagnosis not present

## 2016-08-28 NOTE — Progress Notes (Signed)
[name] ([id number]) Visit Report for 08/28/2016 Arrival Information Details Patient Name: [name], [name]. Date of Service: 08/28/2016 8:45 AM Medical Record Number: [id number] Patient Account Number: [id number] Date of Birth/Sex: 1945-08-17 (71 y.o. Male) Treating RN: Ahmed Prima Primary Care Kamylle Axelson: Thomes Lolling Other Clinician: Referring Keryl Gholson: Thomes Lolling Treating Amiria Orrison/Extender: Frann Rider in Treatment: 8 Visit Information History Since Last Visit All ordered tests and consults were completed: No Patient Arrived: Ambulatory Added or deleted any medications: No Arrival Time: 08:42 Any new allergies or adverse reactions: No Accompanied By: self Had a fall or experienced change in No Transfer Assistance: None activities of daily living that may affect Patient Identification Verified: Yes risk of falls: Secondary Verification Process Yes Signs or symptoms of abuse/neglect since last No Completed: visito Patient Requires Transmission-Based No Hospitalized since last visit: No Precautions: Has Dressing in Place as Prescribed: Yes Patient Has Alerts: Yes Pain Present Now: No Patient Alerts: DMII Electronic Signature(s) Signed: 08/28/2016 3:47:26 PM By: Alric Quan Entered By: Alric Quan on 08/28/2016 08:42:36 [name] ([id number]) -------------------------------------------------------------------------------- Clinic Level of Care Assessment Details Patient Name: [name] Date of Service: 08/28/2016 8:45 AM Medical Record Number: [id number] Patient Account Number: [id number] Date of Birth/Sex: 11/27/1945 (71 y.o. Male) Treating RN: Carolyne Fiscal, Debi Primary Care Rigdon Macomber: Thomes Lolling Other Clinician: Referring Zaelyn Barbary: Thomes Lolling Treating Coda Mathey/Extender: Frann Rider in Treatment: 8 Clinic Level of Care Assessment Items TOOL 4 Quantity Score X - Use when only an EandM is performed on FOLLOW-UP visit 1  0 ASSESSMENTS - Nursing Assessment / Reassessment X - Reassessment of Co-morbidities (includes updates in patient status) 1 10 X - Reassessment of Adherence to Treatment Plan 1 5 ASSESSMENTS - Wound and Skin Assessment / Reassessment []  - Simple Wound Assessment / Reassessment - one wound 0 X - Complex Wound Assessment / Reassessment - multiple wounds 2 5 []  - Dermatologic / Skin Assessment (not related to wound area) 0 ASSESSMENTS - Focused Assessment []  - Circumferential Edema Measurements - multi extremities 0 []  - Nutritional Assessment / Counseling / Intervention 0 []  - Lower Extremity Assessment (monofilament, tuning fork, pulses) 0 []  - Peripheral Arterial Disease Assessment (using hand held doppler) 0 ASSESSMENTS - Ostomy and/or Continence Assessment and Care []  - Incontinence Assessment and Management 0 []  - Ostomy Care Assessment and Management (repouching, etc.) 0 PROCESS - Coordination of Care []  - Simple Patient / Family Education for ongoing care 0 X - Complex (extensive) Patient / Family Education for ongoing care 1 20 []  - Staff obtains Programmer, systems, Records, Test Results / Process Orders 0 []  - Staff telephones HHA, Nursing Homes / Clarify orders / etc 0 []  - Routine Transfer to another Facility (non-emergent condition) 0 DENNIE, MOLTZ ([id number]) []  - Routine Hospital Admission (non-emergent condition) 0 []  - New Admissions / Biomedical engineer / Ordering NPWT, Apligraf, etc. 0 []  - Emergency Hospital Admission (emergent condition) 0 X - Simple Discharge Coordination 1 10 []  - Complex (extensive) Discharge Coordination 0 PROCESS - Special Needs []  - Pediatric / Minor Patient Management 0 []  - Isolation Patient Management 0 []  - Hearing / Language / Visual special needs 0 []  - Assessment of Community assistance (transportation, D/C planning, etc.) 0 []  - Additional assistance / Altered mentation 0 []  - Support Surface(s) Assessment (bed, cushion, seat, etc.)  0 INTERVENTIONS - Wound Cleansing / Measurement []  - Simple Wound Cleansing - one wound 0 X - Complex Wound Cleansing - multiple wounds 2 5 X - Wound  Imaging (photographs - any number of wounds) 1 5 []  - Wound Tracing (instead of photographs) 0 []  - Simple Wound Measurement - one wound 0 X - Complex Wound Measurement - multiple wounds 2 5 INTERVENTIONS - Wound Dressings X - Small Wound Dressing one or multiple wounds 2 10 []  - Medium Wound Dressing one or multiple wounds 0 []  - Large Wound Dressing one or multiple wounds 0 X - Application of Medications - topical 1 5 []  - Application of Medications - injection 0 INTERVENTIONS - Miscellaneous []  - External ear exam 0 JACOBE, STUDY ([id number]) []  - Specimen Collection (cultures, biopsies, blood, body fluids, etc.) 0 []  - Specimen(s) / Culture(s) sent or taken to Lab for analysis 0 []  - Patient Transfer (multiple staff / Harrel Lemon Lift / Similar devices) 0 []  - Simple Staple / Suture removal (25 or less) 0 []  - Complex Staple / Suture removal (26 or more) 0 []  - Hypo / Hyperglycemic Management (close monitor of Blood Glucose) 0 []  - Ankle / Brachial Index (ABI) - do not check if billed separately 0 X - Vital Signs 1 5 Has the patient been seen at the hospital within the last three years: Yes Total Score: 110 Level Of Care: New/Established - Level 3 Electronic Signature(s) Signed: 08/28/2016 3:47:26 PM By: Alric Quan Entered By: Alric Quan on 08/28/2016 10:11:17 [name] ([id number]) -------------------------------------------------------------------------------- Encounter Discharge Information Details Patient Name: [name] Date of Service: 08/28/2016 8:45 AM Medical Record Number: [id number] Patient Account Number: [id number] Date of Birth/Sex: 06-May-1945 (71 y.o. Male) Treating RN: Ahmed Prima Primary Care Kynzleigh Bandel: Thomes Lolling Other Clinician: Referring Sybel Standish: Thomes Lolling Treating  Eddis Pingleton/Extender: Frann Rider in Treatment: 8 Encounter Discharge Information Items Discharge Pain Level: 0 Discharge Condition: Stable Ambulatory Status: Ambulatory Discharge Destination: Home Transportation: Private Auto Accompanied By: self Schedule Follow-up Appointment: Yes Medication Reconciliation completed and provided to Patient/Care No Alik Mawson: Provided on Clinical Summary of Care: 08/28/2016 Form Type Recipient Paper Patient FB Electronic Signature(s) Signed: 08/28/2016 9:02:36 AM By: [name] Entered By: [name] on 08/28/2016 09:02:36 [name] ([id number]) -------------------------------------------------------------------------------- Lower Extremity Assessment Details Patient Name: [name] Date of Service: 08/28/2016 8:45 AM Medical Record Number: [id number] Patient Account Number: [id number] Date of Birth/Sex: [birth year] (71 y.o. Male) Treating RN: Ahmed Prima Primary Care Perri Lamagna: Thomes Lolling Other Clinician: Referring Lorrie Gargan: Thomes Lolling Treating Leisl Spurrier/Extender: Frann Rider in Treatment: 8 Electronic Signature(s) Signed: 08/28/2016 3:47:26 PM By: Alric Quan Entered By: Alric Quan on 08/28/2016 08:48:03 [name] ([id number]) -------------------------------------------------------------------------------- Multi Wound Chart Details Patient Name: [name] Date of Service: 08/28/2016 8:45 AM Medical Record Number: [id number] Patient Account Number: [id number] Date of Birth/Sex: [birth year] (71 y.o. Male) Treating RN: Ahmed Prima Primary Care Teal Bontrager: Thomes Lolling Other Clinician: Referring Abhi Moccia: Thomes Lolling Treating Jamesina Gaugh/Extender: Frann Rider in Treatment: 8 Vital Signs Height(in): 65 Pulse(bpm): 76 Weight(lbs): 161 Blood Pressure 150/78 (mmHg): Body Mass Index(BMI): 27 Temperature(F): 97.7 Respiratory Rate 18 (breaths/min): Photos: [1:No Photos]  [2:No Photos] [N/A:N/A] Wound Location: [1:Right Abdomen - midline] [2:Left Abdomen - midline] [N/A:N/A] Wounding Event: [1:Gradually Appeared] [2:Gradually Appeared] [N/A:N/A] Primary Etiology: [1:Atypical] [2:Atypical] [N/A:N/A] Comorbid History: [1:Cataracts, Chronic Obstructive Pulmonary Disease (COPD), Hypertension, Type II Diabetes, Osteoarthritis, Neuropathy] [2:Cataracts, Chronic Obstructive Pulmonary Disease (COPD), Hypertension, Type II Diabetes, Osteoarthritis,  Neuropathy] [N/A:N/A] Date Acquired: [1:05/17/2015] [2:05/17/2015] [N/A:N/A] Weeks of Treatment: [1:8] [2:8] [N/A:N/A] Wound Status: [1:Open] [2:Open] [N/A:N/A] Measurements L x W x D 1.2x0.3x0.1 [2:4x1x0.1] [N/A:N/A] (cm) Area (cm) : [1:0.283] [2:3.142] [  N/A:N/A] Volume (cm) : [1:0.028] [2:0.314] [N/A:N/A] % Reduction in Area: [1:91.80%] [2:40.50%] [N/A:N/A] % Reduction in Volume: 91.80% [2:40.50%] [N/A:N/A] Classification: [1:Full Thickness Without Exposed Support Structures] [2:Full Thickness Without Exposed Support Structures] [N/A:N/A] Exudate Amount: [1:Medium] [2:Large] [N/A:N/A] Exudate Type: [1:Serous] [2:Purulent] [N/A:N/A] Exudate Color: [1:amber] [2:yellow, [place], green] [N/A:N/A] Wound Margin: [1:Flat and Intact] [2:Flat and Intact] [N/A:N/A] Granulation Amount: [1:None Present (0%)] [2:Medium (34-66%)] [N/A:N/A] Granulation Quality: [1:N/A] [2:Red] [N/A:N/A] Necrotic Amount: [1:Large (67-100%)] [2:Medium (34-66%)] [N/A:N/A] Necrotic Tissue: [1:Eschar] [2:Eschar, Adherent Slough] [N/A:N/A] Exposed Structures: Fascia: No Fascia: No N/A Fat Layer (Subcutaneous Fat Layer (Subcutaneous Tissue) Exposed: No Tissue) Exposed: No Tendon: No Tendon: No Muscle: No Muscle: No Joint: No Joint: No Bone: No Bone: No Limited to Skin Limited to Skin Breakdown Breakdown Epithelialization: Large (67-100%) Small (1-33%) N/A Periwound Skin Texture: Scarring: Yes Induration: Yes N/A Excoriation: No Scarring:  Yes Induration: No Excoriation: No Callus: No Callus: No Crepitus: No Crepitus: No Rash: No Rash: No Periwound Skin Maceration: No Maceration: No N/A Moisture: Dry/Scaly: No Dry/Scaly: No Periwound Skin Color: Atrophie Blanche: No Ecchymosis: Yes N/A Cyanosis: No Atrophie Blanche: No Ecchymosis: No Cyanosis: No Erythema: No Erythema: No Hemosiderin Staining: No Hemosiderin Staining: No Mottled: No Mottled: No Pallor: No Pallor: No Rubor: No Rubor: No Temperature: No Abnormality No Abnormality N/A Tenderness on No No N/A Palpation: Wound Preparation: Ulcer Cleansing: Ulcer Cleansing: N/A Rinsed/Irrigated with Rinsed/Irrigated with Saline Saline Topical Anesthetic Topical Anesthetic Applied: Other: lidocaine Applied: Other: lidocaine 4% 4% Treatment Notes Wound #1 (Right Abdomen - midline) 1. Cleansed with: Clean wound with Normal Saline 2. Anesthetic Topical Lidocaine 4% cream to wound bed prior to debridement 3. Peri-wound Care: Skin Prep 4. Dressing Applied: Hydrafera Blue 5. Secondary [place], Novice ([id number]) Wound #2 (Left Abdomen - midline) 1. Cleansed with: Clean wound with Normal Saline 2. Anesthetic Topical Lidocaine 4% cream to wound bed prior to debridement 3. Peri-wound Care: Skin Prep 4. Dressing Applied: Hydrafera Blue 5. Secondary [place] Signature(s) Signed: 08/28/2016 9:13:53 AM By: Christin Fudge MD, FACS Entered By: Christin Fudge on 08/28/2016 09:13:53 [name] ([id number]) -------------------------------------------------------------------------------- [place] Details Patient Name: ADHRIT, KRENZ Date of Service: 08/28/2016 8:45 AM Medical Record Number: [id number] Patient Account Number: [id number] Date of Birth/Sex: November 21, 1945 (71 y.o. Male) Treating RN: Ahmed Prima Primary Care Kelcie Currie: Thomes Lolling Other Clinician: Referring  Analucia Hush: Thomes Lolling Treating Fletcher Rathbun/Extender: Frann Rider in Treatment: 8 Active Inactive ` Abuse / Safety / Falls / Self Care Management Nursing Diagnoses: Impaired physical mobility Goals: Patient will remain injury free Date Initiated: 07/03/2016 Target Resolution Date: 09/14/2016 Goal Status: Active Interventions: Assess fall risk on admission and as needed Notes: ` Orientation to the Wound Care Program Nursing Diagnoses: Knowledge deficit related to the wound healing center program Goals: Patient/caregiver will verbalize understanding of the [facility] Date Initiated: 07/03/2016 Target Resolution Date: 09/15/2016 Goal Status: Active Interventions: Provide education on orientation to the wound center Notes: ` Wound/Skin Impairment Nursing Diagnoses: Impaired tissue integrity DARICK, FETTERS ([id number]) Goals: Patient/caregiver will verbalize understanding of skin care regimen Date Initiated: 07/03/2016 Target Resolution Date: 09/15/2016 Goal Status: Active Ulcer/skin breakdown will have a volume reduction of 30% by week 4 Date Initiated: 07/03/2016 Target Resolution Date: 09/15/2016 Goal Status: Active Ulcer/skin breakdown will have a volume reduction of 50% by week 8 Date Initiated: 07/03/2016 Target Resolution Date: 09/15/2016 Goal Status: Active Ulcer/skin breakdown will have a volume reduction of 80% by week  12 Date Initiated: 07/03/2016 Target Resolution Date: 09/15/2016 Goal Status: Active Ulcer/skin breakdown will heal within 14 weeks Date Initiated: 07/03/2016 Target Resolution Date: 09/15/2016 Goal Status: Active Interventions: Assess patient/caregiver ability to obtain necessary supplies Assess patient/caregiver ability to perform ulcer/skin care regimen upon admission and as needed Assess ulceration(s) every visit Notes: Electronic Signature(s) Signed: 08/28/2016 3:47:26 PM By: Alric Quan Entered By: Alric Quan on  08/28/2016 08:48:34 [name] ([id number]) -------------------------------------------------------------------------------- Pain Assessment Details Patient Name: [name] Date of Service: 08/28/2016 8:45 AM Medical Record Number: [id number] Patient Account Number: [id number] Date of Birth/Sex: [birth year] (71 y.o. Male) Treating RN: Ahmed Prima Primary Care Gaylen Venning: Thomes Lolling Other Clinician: Referring Kasten Leveque: Thomes Lolling Treating Jennings Stirling/Extender: Frann Rider in Treatment: 8 Active Problems Location of Pain Severity and Description of Pain Patient Has Paino No Site Locations With Dressing Change: No Pain Management and Medication Current Pain Management: Electronic Signature(s) Signed: 08/28/2016 3:47:26 PM By: Alric Quan Entered By: Alric Quan on 08/28/2016 08:42:42 [name] ([id number]) -------------------------------------------------------------------------------- Patient/Caregiver Education Details Patient Name: [name] Date of Service: 08/28/2016 8:45 AM Medical Record Number: [id number] Patient Account Number: [id number] Date of Birth/Gender: [birth year] (71 y.o. Male) Treating RN: Ahmed Prima Primary Care Physician: Thomes Lolling Other Clinician: Referring Physician: Thomes Lolling Treating Physician/Extender: Frann Rider in Treatment: 8 Education Assessment Education Provided To: Patient Education Topics Provided Wound/Skin Impairment: Handouts: Other: change dressing as ordered Methods: Demonstration, Explain/Verbal Responses: State content correctly Electronic Signature(s) Signed: 08/28/2016 3:47:26 PM By: Alric Quan Entered By: Alric Quan on 08/28/2016 08:51:46 [name] ([id number]) -------------------------------------------------------------------------------- Wound Assessment Details Patient Name: [name] Date of Service: 08/28/2016 8:45 AM Medical Record  Number: [id number] Patient Account Number: [id number] Date of Birth/Sex: 07-Feb-1946 (71 y.o. Male) Treating RN: Carolyne Fiscal, Debi Primary Care Kerrie Latour: Thomes Lolling Other Clinician: Referring Adalee Kathan: Thomes Lolling Treating Kylena Mole/Extender: Frann Rider in Treatment: 8 Wound Status Wound Number: 1 Primary Atypical Etiology: Wound Location: Right Abdomen - midline Wound Open Wounding Event: Gradually Appeared Status: Date Acquired: 05/17/2015 Comorbid Cataracts, Chronic Obstructive Weeks Of Treatment: 8 History: Pulmonary Disease (COPD), Clustered Wound: No Hypertension, Type II Diabetes, Osteoarthritis, Neuropathy Photos Photo Uploaded By: Alric Quan on 08/28/2016 11:43:08 Wound Measurements Length: (cm) 1.2 Width: (cm) 0.3 Depth: (cm) 0.1 Area: (cm) 0.283 Volume: (cm) 0.028 % Reduction in Area: 91.8% % Reduction in Volume: 91.8% Epithelialization: Large (67-100%) Tunneling: No Undermining: No Wound Description Full Thickness Without Exposed Classification: Support Structures Wound Margin: Flat and Intact Exudate Medium Amount: Exudate Type: Serous Exudate Color: amber Foul Odor After Cleansing: No Slough/Fibrino No Wound Bed Granulation Amount: None Present (0%) Exposed Structure BODIN, GORKA ([id number]) Necrotic Amount: Large (67-100%) Fascia Exposed: No Necrotic Quality: Eschar Fat Layer (Subcutaneous Tissue) Exposed: No Tendon Exposed: No Muscle Exposed: No Joint Exposed: No Bone Exposed: No Limited to Skin Breakdown Periwound Skin Texture Texture Color No Abnormalities Noted: No No Abnormalities Noted: No Callus: No Atrophie Blanche: No Crepitus: No Cyanosis: No Excoriation: No Ecchymosis: No Induration: No Erythema: No Rash: No Hemosiderin Staining: No Scarring: Yes Mottled: No Pallor: No Moisture Rubor: No No Abnormalities Noted: No Dry / Scaly: No Temperature / Pain Maceration: No Temperature: No  Abnormality Wound Preparation Ulcer Cleansing: Rinsed/Irrigated with Saline Topical Anesthetic Applied: Other: lidocaine 4%, Treatment Notes Wound #1 (Right Abdomen - midline) 1. Cleansed with: Clean wound with Normal Saline 2. Anesthetic Topical Lidocaine 4% cream to wound bed prior to debridement 3. Peri-wound Care: Skin Prep 4. Dressing Applied: Hydrafera  Blue 5. Secondary [place] Signature(s) Signed: 08/28/2016 3:47:26 PM By: Alric Quan Entered By: Alric Quan on 08/28/2016 08:47:42 [name] ([id number]) -------------------------------------------------------------------------------- Wound Assessment Details Patient Name: BENSYN, BORNEMANN Date of Service: 08/28/2016 8:45 AM Medical Record Number: [id number] Patient Account Number: [id number] Date of Birth/Sex: 09/23/1945 (71 y.o. Male) Treating RN: Carolyne Fiscal, Debi Primary Care Randale Carvalho: Thomes Lolling Other Clinician: Referring Arriah Wadle: Thomes Lolling Treating Rexton Greulich/Extender: Frann Rider in Treatment: 8 Wound Status Wound Number: 2 Primary Atypical Etiology: Wound Location: Left Abdomen - midline Wound Open Wounding Event: Gradually Appeared Status: Date Acquired: 05/17/2015 Comorbid Cataracts, Chronic Obstructive Weeks Of Treatment: 8 History: Pulmonary Disease (COPD), Clustered Wound: No Hypertension, Type II Diabetes, Osteoarthritis, Neuropathy Photos Photo Uploaded By: Alric Quan on 08/28/2016 11:43:08 Wound Measurements Length: (cm) 4 Width: (cm) 1 Depth: (cm) 0.1 Area: (cm) 3.142 Volume: (cm) 0.314 % Reduction in Area: 40.5% % Reduction in Volume: 40.5% Epithelialization: Small (1-33%) Tunneling: No Undermining: No Wound Description Full Thickness Without Exposed Foul Odor Afte Classification: Support Structures Slough/Fibrino Wound Margin: Flat and Intact Exudate Large Amount: Exudate Type: Purulent Exudate Color: yellow,  [place], green r Cleansing: No No Wound Bed Granulation Amount: Medium (34-66%) Exposed Structure DOMENIQUE, SOUTHERS ([id number]) Granulation Quality: Red Fascia Exposed: No Necrotic Amount: Medium (34-66%) Fat Layer (Subcutaneous Tissue) Exposed: No Necrotic Quality: Eschar, Adherent Slough Tendon Exposed: No Muscle Exposed: No Joint Exposed: No Bone Exposed: No Limited to Skin Breakdown Periwound Skin Texture Texture Color No Abnormalities Noted: No No Abnormalities Noted: No Callus: No Atrophie Blanche: No Crepitus: No Cyanosis: No Excoriation: No Ecchymosis: Yes Induration: Yes Erythema: No Rash: No Hemosiderin Staining: No Scarring: Yes Mottled: No Pallor: No Moisture Rubor: No No Abnormalities Noted: No Dry / Scaly: No Temperature / Pain Maceration: No Temperature: No Abnormality Wound Preparation Ulcer Cleansing: Rinsed/Irrigated with Saline Topical Anesthetic Applied: Other: lidocaine 4%, Treatment Notes Wound #2 (Left Abdomen - midline) 1. Cleansed with: Clean wound with Normal Saline 2. Anesthetic Topical Lidocaine 4% cream to wound bed prior to debridement 3. Peri-wound Care: Skin Prep 4. Dressing Applied: Hydrafera Blue 5. Secondary [place] Signature(s) Signed: 08/28/2016 3:47:26 PM By: Alric Quan Entered By: Alric Quan on 08/28/2016 08:47:55 [name] ([id number]) -------------------------------------------------------------------------------- [place] Details Patient Name: NHAN, QUALLEY Date of Service: 08/28/2016 8:45 AM Medical Record Number: [id number] Patient Account Number: [id number] Date of Birth/Sex: 19-Mar-1945 (71 y.o. Male) Treating RN: Carolyne Fiscal, Debi Primary Care Alazay Leicht: Thomes Lolling Other Clinician: Referring Elidia Bonenfant: Thomes Lolling Treating Aariona Momon/Extender: Frann Rider in Treatment: 8 Vital Signs Time Taken: 08:42 Temperature (F): 97.7 Height (in): 65 Pulse  (bpm): 76 Weight (lbs): 161 Respiratory Rate (breaths/min): 18 Body Mass Index (BMI): 26.8 Blood Pressure (mmHg): 150/78 Reference Range: 80 - 120 mg / dl Electronic Signature(s) Signed: 08/28/2016 3:47:26 PM By: Alric Quan Entered By: Alric Quan on 08/28/2016 08:43:13

## 2016-08-28 NOTE — Progress Notes (Signed)
[name] ([id number]) Visit Report for 08/28/2016 Chief Complaint Document Details Patient Name: [name], [name]. Date of Service: 08/28/2016 8:45 AM Medical Record Number: [id number] Patient Account Number: [id number] Date of Birth/Sex: 1945/07/26 (71 y.o. Male) Treating RN: [name] Primary Care Provider: Thomes Jackson Other Clinician: Referring Provider: Thomes Jackson Treating Provider/Extender: [name] in Treatment: 8 Information Obtained from: Patient Chief Complaint Patients presents for treatment of an open diabetic ulcer to the anterior abdominal wall in the epigastric and right upper quadrant which she's had for over 5 years Electronic Signature(s) Signed: 08/28/2016 9:14:00 AM By: Michael Fudge MD, FACS Entered By: [name] on 08/28/2016 09:14:00 [name] ([id number]) -------------------------------------------------------------------------------- HPI Details Patient Name: [name] Date of Service: 08/28/2016 8:45 AM Medical Record Number: [id number] Patient Account Number: [id number] Date of Birth/Sex: July 02, 1945 (71 y.o. Male) Treating RN: [name] Primary Care Provider: Thomes Jackson Other Clinician: Referring Provider: Thomes Jackson Treating Provider/Extender: [name] in Treatment: 8 History of Present Illness Location: abdomen wall wounds,-- right upper quadrant and epigastric region Quality: Patient reports experiencing a dull pain to affected area(s). Severity: Patient states wound are getting worse. Duration: Patient has had the wound for >5 year's prior to seeking treatment at the wound center Timing: Pain in wound is Intermittent (comes and goes Context: The wound appeared gradually over time Modifying Factors: Consults to this date include:not seen a surgeon and only sees his PCP for his medical complaints Associated Signs and Symptoms: Patient reports having increase discharge. HPI Description:  71 year old gentleman has been seen recently by his PCP [name], who sees him with a history of diabetes mellitus, peripheral neuropathy, B12 deficiency, hypertension, osteoarthritis and the patient had recent blood sugar checked which was 163. Past medical history includes essential hypertension, incisional hernia, diabetes mellitus, colon polyps, COPD, tobacco abuse in the past, chewing tobacco, GERD. He has also been treated for chronic back pain with no sciatica and is on oxycodone. His last hemoglobin A1c was 6.9% His abdominal surgery was done at [facility] over 15 years ago and gradually there was sutures which protruded and then there was mesh which protruded. He washes this during his shower but does not put any dressing and the wound is covered with a lot of debris. He has never had a surgical consultation for this. 07/10/2016 -- the patient has spoken to his PCP who said he was going to call me but I have not yet had a phone conversation with the physician. From what I understand the physician was reluctant to have a surgical opinion because of the patient's several comorbidities and he feels that the surgery may be detrimental to his overall health 07/24/2016 -- the patient's daughter is at the bedside and we've had a detailed discussion regarding his options for a surgical opinion and possible surgical intervention before this becomes a emergent situation. She will seek primary care input and take the father to [facility] for a surgical opinion. 07/31/2016 -- the patient's daughter has organized for a surgical opinion at [facility] on June 14 08/14/2016 -- it has been about 3 weeks since he has stopped chewing tobacco. 08/28/2016 -- he was seen at [facility] by [name] -- the assessment was based on physical examination and review of his previous history of surgical repair. the patient had undergone a ventral hernia repair in 1998 with Dr. Quay Jackson at [facility] at a prior open cholecystectomy incision. he was evaluated by  [name] in 2013 and at that time it was decided to watch and wait. [name] recommended a repeat CT scan and requested records from dura and regional operative records and would follow-up the patient, after [name] PCP [name] reviewed him for overall fitness for surgery. [name] ([id number]) Electronic Signature(s) Signed: 08/28/2016 9:14:08 AM By: Michael Fudge MD, FACS Previous Signature: 08/28/2016 9:13:31 AM Version By: Michael Fudge MD, FACS Entered By: [name] on 08/28/2016 09:14:07 [name] ([id number]) -------------------------------------------------------------------------------- Physical Exam Details Patient Name: [name], [name] Date of Service: 08/28/2016 8:45 AM Medical Record Number: [id number] Patient Account Number: [id number] Date of Birth/Sex: 1945/09/16 (71 y.o. Male) Treating RN: [name] Primary Care Provider: Thomes Jackson Other Clinician: Referring Provider: Thomes Jackson Treating Provider/Extender: [name] in Treatment: 8 Constitutional . Pulse regular. Respirations normal and unlabored. Afebrile. . Eyes Nonicteric. Reactive to light. Ears, Nose, Mouth, and Throat Lips, teeth, and gums WNL.[name] Moist mucosa without lesions. Neck supple and nontender. No palpable supraclavicular or cervical adenopathy. Normal sized without goiter. Respiratory WNL. No retractions.. Cardiovascular Pedal Pulses WNL. No clubbing, cyanosis or edema. Lymphatic No adneopathy. No adenopathy. No adenopathy. Musculoskeletal Adexa without tenderness or enlargement.. Digits and nails w/o clubbing, cyanosis, infection, petechiae, ischemia, or inflammatory conditions.. Integumentary (Hair, Skin) No suspicious lesions. No crepitus or fluctuance. No peri-wound warmth or erythema. No masses.[name] Psychiatric Judgement and insight Intact.. No evidence of depression, anxiety,  or agitation.. Notes the wound continues to have minimal 0 event material but no florid signs of inflammation. We will continue with local care and watch him closely Electronic Signature(s) Signed: 08/28/2016 9:14:43 AM By: Michael Fudge MD, FACS Entered By: [name] on 08/28/2016 09:14:42 [name] ([id number]) -------------------------------------------------------------------------------- Physician Orders Details Patient Name: [name] Date of Service: 08/28/2016 8:45 AM Medical Record Number: [id number] Patient Account Number: [id number] Date of Birth/Sex: [birth year] (71 y.o. Male) Treating RN: [name] Primary Care Provider: Thomes Jackson Other Clinician: Referring Provider: Thomes Jackson Treating Provider/Extender: [name] in Treatment: 8 Verbal / Phone Orders: Yes ClinicianCarolyne Fiscal, Debi Read Back and Verified: Yes Diagnosis Coding Wound Cleansing Wound #1 Right Abdomen - midline o Clean wound with Normal Saline. o May Shower, gently pat wound dry prior to applying new dressing. Wound #2 Left Abdomen - midline o Clean wound with Normal Saline. o May Shower, gently pat wound dry prior to applying new dressing. Anesthetic Wound #1 Right Abdomen - midline o Topical Lidocaine 4% cream applied to wound bed prior to debridement Wound #2 Left Abdomen - midline o Topical Lidocaine 4% cream applied to wound bed prior to debridement Skin Barriers/Peri-Wound Care Wound #1 Right Abdomen - midline o Skin Prep Wound #2 Left Abdomen - midline o Skin Prep Primary Wound Dressing Wound #1 Right Abdomen - midline o Hydrafera Blue Wound #2 Left Abdomen - midline o Hydrafera Blue Secondary Dressing Wound #1 Right Abdomen - midline o Boardered Foam Dressing Wound #2 Left Abdomen - midline o Boardered Foam Dressing [name], [name] ([id number]) Dressing Change Frequency Wound #1 Right Abdomen - midline o Change dressing  every other day. Wound #2 Left Abdomen - midline o Change dressing every other day. Follow-up Appointments Wound #1 Right Abdomen - midline o Return Appointment in 1 week. Wound #2 Left Abdomen - midline o Return Appointment in 1 week. Additional Orders / Instructions Wound #1 Right Abdomen - midline o Other: - Go see a Engineer, drilling about your hernias and get a surgical  opinion. Wound #2 Left Abdomen - midline o Other: - Go see a Engineer, drilling about your hernias and get a surgical opinion. Electronic Signature(s) Signed: 08/28/2016 3:10:48 PM By: Michael Fudge MD, FACS Signed: 08/28/2016 3:47:26 PM By: Alric Quan Entered By: Alric Quan on 08/28/2016 08:51:02 [name] ([id number]) -------------------------------------------------------------------------------- Problem List Details Patient Name: [name], [name] Date of Service: 08/28/2016 8:45 AM Medical Record Number: [id number] Patient Account Number: [id number] Date of Birth/Sex: 11/24/1945 (71 y.o. Male) Treating RN: [name] Primary Care Provider: Thomes Jackson Other Clinician: Referring Provider: Thomes Jackson Treating Provider/Extender: [name] in Treatment: 8 Active Problems ICD-10 Encounter Code Description Active Date Diagnosis E11.622 Type 2 diabetes mellitus with other skin ulcer 07/03/2016 Yes S31.100A Unspecified open wound of abdominal wall, right upper 07/03/2016 Yes quadrant without penetration into peritoneal cavity, initial encounter S31.102A Unspecified open wound of abdominal wall, epigastric 07/03/2016 Yes region without penetration into peritoneal cavity, initial encounter F17.228 Nicotine dependence, chewing tobacco, with other 07/03/2016 Yes nicotine-induced disorders Inactive Problems Resolved Problems Electronic Signature(s) Signed: 08/28/2016 9:13:49 AM By: Michael Fudge MD, FACS Entered By: [name] on 08/28/2016 09:13:49 [name]  ([id number]) -------------------------------------------------------------------------------- Progress Note Details Patient Name: [name] Date of Service: 08/28/2016 8:45 AM Medical Record Number: [id number] Patient Account Number: [id number] Date of Birth/Sex: 17-Aug-1945 (71 y.o. Male) Treating RN: Carolyne Fiscal, Debi Primary Care Provider: Thomes Jackson Other Clinician: Referring Provider: Thomes Jackson Treating Provider/Extender: [name] in Treatment: 8 Subjective Chief Complaint Information obtained from Patient Patients presents for treatment of an open diabetic ulcer to the anterior abdominal wall in the epigastric and right upper quadrant which she's had for over 5 years History of Present Illness (HPI) The following HPI elements were documented for the patient's wound: Location: abdomen wall wounds,-- right upper quadrant and epigastric region Quality: Patient reports experiencing a dull pain to affected area(s). Severity: Patient states wound are getting worse. Duration: Patient has had the wound for >5 year's prior to seeking treatment at the wound center Timing: Pain in wound is Intermittent (comes and goes Context: The wound appeared gradually over time Modifying Factors: Consults to this date include:not seen a surgeon and only sees his PCP for his medical complaints Associated Signs and Symptoms: Patient reports having increase discharge. 71 year old gentleman has been seen recently by his PCP [name], who sees him with a history of diabetes mellitus, peripheral neuropathy, B12 deficiency, hypertension, osteoarthritis and the patient had recent blood sugar checked which was 163. Past medical history includes essential hypertension, incisional hernia, diabetes mellitus, colon polyps, COPD, tobacco abuse in the past, chewing tobacco, GERD. He has also been treated for chronic back pain with no sciatica and is on oxycodone. His last hemoglobin A1c  was 6.9% His abdominal surgery was done at [facility] over 15 years ago and gradually there was sutures which protruded and then there was mesh which protruded. He washes this during his shower but does not put any dressing and the wound is covered with a lot of debris. He has never had a surgical consultation for this. 07/10/2016 -- the patient has spoken to his PCP who said he was going to call me but I have not yet had a phone conversation with the physician. From what I understand the physician was reluctant to have a surgical opinion because of the patient's several comorbidities and he feels that the surgery may be detrimental to his overall health 07/24/2016 -- the patient's daughter is at the bedside  and we've had a detailed discussion regarding his options for a surgical opinion and possible surgical intervention before this becomes a emergent situation. She will seek primary care input and take the father to [facility] for a surgical opinion. 07/31/2016 -- the patient's daughter has organized for a surgical opinion at [facility] on June 14 08/14/2016 -- it has been about 3 weeks since he has stopped chewing tobacco. [name], [name] ([id number]) 08/28/2016 -- he was seen at [facility] by [name] -- the assessment was based on physical examination and review of his previous history of surgical repair. the patient had undergone a ventral hernia repair in 1998 with Dr. Quay Jackson at [facility] at a prior open cholecystectomy incision. he was evaluated by [name] in 2013 and at that time it was decided to watch and wait. [name] recommended a repeat CT scan and requested records from dura and regional operative records and would follow-up the patient, after Mr. Hutchinson PCP [name] reviewed him for overall fitness for surgery. Objective Constitutional Pulse regular. Respirations normal and unlabored. Afebrile. Vitals Time Taken: 8:42 AM, Height: 65 in, Weight:  161 lbs, BMI: 26.8, Temperature: 97.7 F, Pulse: 76 bpm, Respiratory Rate: 18 breaths/min, Blood Pressure: 150/78 mmHg. Eyes Nonicteric. Reactive to light. Ears, Nose, Mouth, and Throat Lips, teeth, and gums WNL.[name] Moist mucosa without lesions. Neck supple and nontender. No palpable supraclavicular or cervical adenopathy. Normal sized without goiter. Respiratory WNL. No retractions.. Cardiovascular Pedal Pulses WNL. No clubbing, cyanosis or edema. Lymphatic No adneopathy. No adenopathy. No adenopathy. Musculoskeletal Adexa without tenderness or enlargement.. Digits and nails w/o clubbing, cyanosis, infection, petechiae, ischemia, or inflammatory conditions.[name] Psychiatric Judgement and insight Intact.. No evidence of depression, anxiety, or agitation.. General Notes: the wound continues to have minimal 0 event material but no florid signs of inflammation. We will continue with local care and watch him closely [name], [name]. ([id number]) Integumentary (Hair, Skin) No suspicious lesions. No crepitus or fluctuance. No peri-wound warmth or erythema. No masses.. Wound #1 status is Open. Original cause of wound was Gradually Appeared. The wound is located on the Right Abdomen - midline. The wound measures 1.2cm length x 0.3cm width x 0.1cm depth; 0.283cm^2 area and 0.028cm^3 volume. The wound is limited to skin breakdown. There is no tunneling or undermining noted. There is a medium amount of serous drainage noted. The wound margin is flat and intact. There is no granulation within the wound bed. There is a large (67-100%) amount of necrotic tissue within the wound bed including Eschar. The periwound skin appearance exhibited: Scarring. The periwound skin appearance did not exhibit: Callus, Crepitus, Excoriation, Induration, Rash, Dry/Scaly, Maceration, Atrophie Blanche, Cyanosis, Ecchymosis, Hemosiderin Staining, Mottled, Pallor, Rubor, Erythema. Periwound temperature was noted as No  Abnormality. Wound #2 status is Open. Original cause of wound was Gradually Appeared. The wound is located on the Left Abdomen - midline. The wound measures 4cm length x 1cm width x 0.1cm depth; 3.142cm^2 area and 0.314cm^3 volume. The wound is limited to skin breakdown. There is no tunneling or undermining noted. There is a large amount of purulent drainage noted. The wound margin is flat and intact. There is medium (34-66%) red granulation within the wound bed. There is a medium (34-66%) amount of necrotic tissue within the wound bed including Eschar and Adherent Slough. The periwound skin appearance exhibited: Induration, Scarring, Ecchymosis. The periwound skin appearance did not exhibit: Callus, Crepitus, Excoriation, Rash, Dry/Scaly, Maceration, Atrophie Blanche, Cyanosis, Hemosiderin Staining, Mottled,  Pallor, Rubor, Erythema. Periwound temperature was noted as No Abnormality. Assessment Active Problems ICD-10 E11.622 - Type 2 diabetes mellitus with other skin ulcer S31.100A - Unspecified open wound of abdominal wall, right upper quadrant without penetration into peritoneal cavity, initial encounter S31.102A - Unspecified open wound of abdominal wall, epigastric region without penetration into peritoneal cavity, initial encounter F17.228 - Nicotine dependence, chewing tobacco, with other nicotine-induced disorders Plan Wound Cleansing: Wound #1 Right Abdomen - midline: Clean wound with Normal Saline. May Shower, gently pat wound dry prior to applying new dressing. [name], [name] ([id number]) Wound #2 Left Abdomen - midline: Clean wound with Normal Saline. May Shower, gently pat wound dry prior to applying new dressing. Anesthetic: Wound #1 Right Abdomen - midline: Topical Lidocaine 4% cream applied to wound bed prior to debridement Wound #2 Left Abdomen - midline: Topical Lidocaine 4% cream applied to wound bed prior to debridement Skin Barriers/Peri-Wound Care: Wound #1  Right Abdomen - midline: Skin Prep Wound #2 Left Abdomen - midline: Skin Prep Primary Wound Dressing: Wound #1 Right Abdomen - midline: Hydrafera Blue Wound #2 Left Abdomen - midline: Hydrafera Blue Secondary Dressing: Wound #1 Right Abdomen - midline: Boardered Foam Dressing Wound #2 Left Abdomen - midline: Boardered Foam Dressing Dressing Change Frequency: Wound #1 Right Abdomen - midline: Change dressing every other day. Wound #2 Left Abdomen - midline: Change dressing every other day. Follow-up Appointments: Wound #1 Right Abdomen - midline: Return Appointment in 1 week. Wound #2 Left Abdomen - midline: Return Appointment in 1 week. Additional Orders / Instructions: Wound #1 Right Abdomen - midline: Other: - Go see a Engineer, drilling about your hernias and get a surgical opinion. Wound #2 Left Abdomen - midline: Other: - Go see a Engineer, drilling about your hernias and get a surgical opinion. I have reviewed his surgical notes from [facility] and we will await the CT scan and further opinion from the surgeons. In the meanwhile I have recommended: 1. Hydrofera blue and a bordered foam to be applied daily after washing with soap and water. 2. Good control of his diabetes mellitus 3. Adequate protein, vitamin A, vitamin C and zinc. Bowlby, Cashel O. ([id number]) 4. We will continue with supportive care and try and heal this wound to the best of our ability. Electronic Signature(s) Signed: 08/28/2016 9:16:03 AM By: Michael Fudge MD, FACS Entered By: [name] on 08/28/2016 [place], [place]. ([id number]) -------------------------------------------------------------------------------- SuperBill Details Patient Name: [name] Date of Service: 08/28/2016 Medical Record Number: [id number] Patient Account Number: [id number] Date of Birth/Sex: 12-19-1945 (71 y.o. Male) Treating RN: Carolyne Fiscal, Debi Primary Care Provider: Thomes Jackson Other Clinician: Referring  Provider: Thomes Jackson Treating Provider/Extender: [name] in Treatment: 8 Diagnosis Coding ICD-10 Codes Code Description E11.622 Type 2 diabetes mellitus with other skin ulcer Unspecified open wound of abdominal wall, right upper quadrant without penetration into S31.100A peritoneal cavity, initial encounter Unspecified open wound of abdominal wall, epigastric region without penetration into S31.102A peritoneal cavity, initial encounter F17.228 Nicotine dependence, chewing tobacco, with other nicotine-induced disorders Facility Procedures CPT4 Code: [id number] Description: 99213 - WOUND CARE VISIT-LEV 3 EST PT Modifier: Quantity: 1 Physician Procedures CPT4: Description Modifier Quantity Code [id number] 58099 - WC PHYS LEVEL 3 - EST PT 1 ICD-10 Description Diagnosis E11.622 Type 2 diabetes mellitus with other skin ulcer S31.100A Unspecified open wound of abdominal wall, right upper quadrant without  penetration into peritoneal cavity, initial encounter S31.102A Unspecified open wound of abdominal wall, epigastric region without penetration  into peritoneal cavity, initial encounter Electronic Signature(s) Signed: 08/28/2016 3:10:48 PM By: Michael Fudge MD, FACS Signed: 08/28/2016 3:47:26 PM By: Alric Quan Previous Signature: 08/28/2016 9:17:47 AM Version By: Michael Fudge MD, FACS Entered By: Alric Quan on 08/28/2016 10:11:26

## 2016-09-04 ENCOUNTER — Encounter: Payer: Medicare Other | Admitting: Physician Assistant

## 2016-09-04 DIAGNOSIS — E11622 Type 2 diabetes mellitus with other skin ulcer: Secondary | ICD-10-CM | POA: Diagnosis not present

## 2016-09-05 NOTE — Progress Notes (Signed)
[name] ([id number]) Visit Report for 09/04/2016 Chief Complaint Document Details Patient Name: [name], [name]. Date of Service: 09/04/2016 9:45 AM Medical Record Number: [id number] Patient Account Number: [id number] Date of Birth/Sex: [birth year] (71 y.o. Male) Treating RN: Ahmed Prima Primary Care Provider: Thomes Lolling Other Clinician: Referring Provider: Thomes Lolling Treating Provider/Extender: Sharalyn Ink in Treatment: 9 Information Obtained from: Patient Chief Complaint Patients presents for treatment of an open diabetic ulcer to the anterior abdominal wall in the epigastric and right upper quadrant Electronic Signature(s) Signed: 09/04/2016 5:09:57 PM By: Worthy Keeler PA-C Entered By: Worthy Keeler on 09/04/2016 09:58:23 [name] ([id number]) -------------------------------------------------------------------------------- HPI Details Patient Name: [name] Date of Service: 09/04/2016 9:45 AM Medical Record Number: [id number] Patient Account Number: [id number] Date of Birth/Sex: [birth year] (71 y.o. Male) Treating RN: Carolyne Fiscal, Debi Primary Care Provider: Thomes Lolling Other Clinician: Referring Provider: Thomes Lolling Treating Provider/Extender: Melburn Hake, HOYT Weeks in Treatment: 9 History of Present Illness Location: abdomen wall wounds,-- right upper quadrant and epigastric region Quality: Patient reports experiencing a dull pain to affected area(s). Severity: Patient states wound are getting worse. Duration: Patient has had the wound for >5 year's prior to seeking treatment at the wound center Timing: Pain in wound is Intermittent (comes and goes Context: The wound appeared gradually over time Modifying Factors: Consults to this date include:not seen a surgeon and only sees his PCP for his medical complaints Associated Signs and Symptoms: Patient reports having increase discharge. HPI Description: 71 year old gentleman has been  seen recently by his PCP Dr. Thomes Lolling, who sees him with a history of diabetes mellitus, peripheral neuropathy, B12 deficiency, hypertension, osteoarthritis and the patient had recent blood sugar checked which was 163. Past medical history includes essential hypertension, incisional hernia, diabetes mellitus, colon polyps, COPD, tobacco abuse in the past, chewing tobacco, GERD. He has also been treated for chronic back pain with no sciatica and is on oxycodone. His last hemoglobin A1c was 6.9% His abdominal surgery was done at [facility] over 15 years ago and gradually there was sutures which protruded and then there was mesh which protruded. He washes this during his shower but does not put any dressing and the wound is covered with a lot of debris. He has never had a surgical consultation for this. 07/10/2016 -- the patient has spoken to his PCP who said he was going to call me but I have not yet had a phone conversation with the physician. From what I understand the physician was reluctant to have a surgical opinion because of the patient's several comorbidities and he feels that the surgery may be detrimental to his overall health 07/24/2016 -- the patient's daughter is at the bedside and we've had a detailed discussion regarding his options for a surgical opinion and possible surgical intervention before this becomes a emergent situation. She will seek primary care input and take the father to [facility] for a surgical opinion. 07/31/2016 -- the patient's daughter has organized for a surgical opinion at [facility] on June 14 08/14/2016 -- it has been about 3 weeks since he has stopped chewing tobacco. 08/28/2016 -- he was seen at [facility] by Dr. Roby Lofts -- the assessment was based on physical examination and review of his previous history of surgical repair. the patient had undergone a ventral hernia repair in 1998 with Dr. Quay Burow at [facility] at a prior open  cholecystectomy incision. he was evaluated by Dr. Denice Paradise in 2013  and at that time it was decided to watch and wait. Dr. Payton Doughty recommended a repeat CT scan and requested records from dura and regional operative records and would follow-up the patient, after Mr. Flammer PCP Dr. Gwenlyn Saran reviewed him for overall fitness for surgery. 09/04/16 patient's wound actually appear to be doing well on evaluation today. He is not really experience [name], [name]. ([id number]) any discomfort and the right abdominal wound is dry and almost appears to be healing up. He continues to have some drainage from the left but this seems to be more of a potential seroma which with milking is able to be cleared out. I discussed with patient that when he performs his dressing changes at home it would be beneficial for him to do this as well. Otherwise there's no evidence of infection. He is still waiting on his CT scan and appointments to determine whether he is going to have another surgery. Electronic Signature(s) Signed: 09/04/2016 5:09:57 PM By: Worthy Keeler PA-C Entered By: Worthy Keeler on 09/04/2016 10:05:42 [name] ([id number]) -------------------------------------------------------------------------------- Physical Exam Details Patient Name: [name], [name] Date of Service: 09/04/2016 9:45 AM Medical Record Number: [id number] Patient Account Number: [id number] Date of Birth/Sex: 06-Jan-1946 (71 y.o. Male) Treating RN: Carolyne Fiscal, Debi Primary Care Provider: Thomes Lolling Other Clinician: Referring Provider: Thomes Lolling Treating Provider/Extender: Melburn Hake, HOYT Weeks in Treatment: 9 Constitutional Well-nourished and well-hydrated in no acute distress. Respiratory normal breathing without difficulty. clear to auscultation bilaterally. Cardiovascular regular rate and rhythm with normal S1, S2. Psychiatric this patient is able to make decisions and demonstrates good insight into disease process.  Alert and Oriented x 3. pleasant and cooperative. Notes Patient's wounds appeared to be hyper granular at the surface although there is not significant drainage noted and overall there is no erythema which would be an indication of a infection. He does not have discomfort on palpation. Overall I'm pleased with how this appears to be progressing on evaluation today. Electronic Signature(s) Signed: 09/04/2016 5:09:57 PM By: Worthy Keeler PA-C Entered By: Worthy Keeler on 09/04/2016 10:08:54 [name] ([id number]) -------------------------------------------------------------------------------- Physician Orders Details Patient Name: [name], [name] Date of Service: 09/04/2016 9:45 AM Medical Record Number: [id number] Patient Account Number: [id number] Date of Birth/Sex: 1946/01/30 (71 y.o. Male) Treating RN: Carolyne Fiscal, Debi Primary Care Provider: Thomes Lolling Other Clinician: Referring Provider: Thomes Lolling Treating Provider/Extender: Sharalyn Ink in Treatment: 9 Verbal / Phone Orders: Yes Clinician: Carolyne Fiscal, Debi Read Back and Verified: Yes Diagnosis Coding ICD-10 Coding Code Description E11.622 Type 2 diabetes mellitus with other skin ulcer Unspecified open wound of abdominal wall, right upper quadrant without penetration into S31.100A peritoneal cavity, initial encounter Unspecified open wound of abdominal wall, epigastric region without penetration into S31.102A peritoneal cavity, initial encounter F17.228 Nicotine dependence, chewing tobacco, with other nicotine-induced disorders Wound Cleansing Wound #1 Right Abdomen - midline o Clean wound with Normal Saline. o May Shower, gently pat wound dry prior to applying new dressing. Wound #2 Left Abdomen - midline o Clean wound with Normal Saline. o May Shower, gently pat wound dry prior to applying new dressing. Anesthetic Wound #1 Right Abdomen - midline o Topical Lidocaine 4% cream applied to  wound bed prior to debridement Wound #2 Left Abdomen - midline o Topical Lidocaine 4% cream applied to wound bed prior to debridement Skin Barriers/Peri-Wound Care Wound #1 Right Abdomen - midline o Skin Prep Wound #2 Left Abdomen - midline o Skin Prep Primary Wound Dressing Wound #1  Right Abdomen - midline o [address] [place], [place] ([id number]) Wound #2 Left Abdomen - midline o Hydrafera Blue Secondary Dressing Wound #1 Right Abdomen - midline o Boardered Foam Dressing Wound #2 Left Abdomen - midline o Boardered Foam Dressing Dressing Change Frequency Wound #1 Right Abdomen - midline o Change dressing every other day. Wound #2 Left Abdomen - midline o Change dressing every other day. Follow-up Appointments Wound #1 Right Abdomen - midline o Return Appointment in 1 week. Wound #2 Left Abdomen - midline o Return Appointment in 1 week. Additional Orders / Instructions Wound #1 Right Abdomen - midline o Other: - Go see a Engineer, drilling about your hernias and get a surgical opinion. Wound #2 Left Abdomen - midline o Other: - Go see a Engineer, drilling about your hernias and get a surgical opinion. Notes I'm going to recommend that we continue with the [facility] Dressing for the next week. We will see were things stand at that point. If anything worsens in the interim he will contact the office for additional recommendations. Otherwise we will wait his medical evaluation and CT scan to see whether or not he is going to need further surgery and whether or not they are going to be able to do it. Electronic Signature(s) Signed: 09/04/2016 5:09:57 PM By: Worthy Keeler PA-C Entered By: Worthy Keeler on 09/04/2016 10:09:42 [name] ([id number]) -------------------------------------------------------------------------------- Problem List Details Patient Name: NOELLE, HOOGLAND Date of Service: 09/04/2016 9:45 AM Medical Record Number:  [id number] Patient Account Number: [id number] Date of Birth/Sex: 1945-06-03 (71 y.o. Male) Treating RN: Ahmed Prima Primary Care Provider: Thomes Lolling Other Clinician: Referring Provider: Thomes Lolling Treating Provider/Extender: Sharalyn Ink in Treatment: 9 Active Problems ICD-10 Encounter Code Description Active Date Diagnosis E11.622 Type 2 diabetes mellitus with other skin ulcer 07/03/2016 Yes S31.100A Unspecified open wound of abdominal wall, right upper 07/03/2016 Yes quadrant without penetration into peritoneal cavity, initial encounter S31.102A Unspecified open wound of abdominal wall, epigastric 07/03/2016 Yes region without penetration into peritoneal cavity, initial encounter F17.228 Nicotine dependence, chewing tobacco, with other 07/03/2016 Yes nicotine-induced disorders Inactive Problems Resolved Problems Electronic Signature(s) Signed: 09/04/2016 5:09:57 PM By: Worthy Keeler PA-C Entered By: Worthy Keeler on 09/04/2016 09:58:06 [name] ([id number]) -------------------------------------------------------------------------------- Progress Note Details Patient Name: [name] Date of Service: 09/04/2016 9:45 AM Medical Record Number: [id number] Patient Account Number: [id number] Date of Birth/Sex: 11-Aug-1945 (71 y.o. Male) Treating RN: Carolyne Fiscal, Debi Primary Care Provider: Thomes Lolling Other Clinician: Referring Provider: Thomes Lolling Treating Provider/Extender: Sharalyn Ink in Treatment: 9 Subjective Chief Complaint Information obtained from Patient Patients presents for treatment of an open diabetic ulcer to the anterior abdominal wall in the epigastric and right upper quadrant History of Present Illness (HPI) The following HPI elements were documented for the patient's wound: Location: abdomen wall wounds,-- right upper quadrant and epigastric region Quality: Patient reports experiencing a dull pain to affected  area(s). Severity: Patient states wound are getting worse. Duration: Patient has had the wound for >5 year's prior to seeking treatment at the wound center Timing: Pain in wound is Intermittent (comes and goes Context: The wound appeared gradually over time Modifying Factors: Consults to this date include:not seen a surgeon and only sees his PCP for his medical complaints Associated Signs and Symptoms: Patient reports having increase discharge. 71 year old gentleman has been seen recently by his PCP Dr. Thomes Lolling, who sees him with a history of diabetes mellitus, peripheral neuropathy,  B12 deficiency, hypertension, osteoarthritis and the patient had recent blood sugar checked which was 163. Past medical history includes essential hypertension, incisional hernia, diabetes mellitus, colon polyps, COPD, tobacco abuse in the past, chewing tobacco, GERD. He has also been treated for chronic back pain with no sciatica and is on oxycodone. His last hemoglobin A1c was 6.9% His abdominal surgery was done at [facility] over 15 years ago and gradually there was sutures which protruded and then there was mesh which protruded. He washes this during his shower but does not put any dressing and the wound is covered with a lot of debris. He has never had a surgical consultation for this. 07/10/2016 -- the patient has spoken to his PCP who said he was going to call me but I have not yet had a phone conversation with the physician. From what I understand the physician was reluctant to have a surgical opinion because of the patient's several comorbidities and he feels that the surgery may be detrimental to his overall health 07/24/2016 -- the patient's daughter is at the bedside and we've had a detailed discussion regarding his options for a surgical opinion and possible surgical intervention before this becomes a emergent situation. She will seek primary care input and take the father to [facility]  for a surgical opinion. 07/31/2016 -- the patient's daughter has organized for a surgical opinion at [facility] on June 14 08/14/2016 -- it has been about 3 weeks since he has stopped chewing tobacco. [name], [name] ([id number]) 08/28/2016 -- he was seen at [facility] by Dr. Roby Lofts -- the assessment was based on physical examination and review of his previous history of surgical repair. the patient had undergone a ventral hernia repair in 1998 with Dr. Quay Burow at [facility] at a prior open cholecystectomy incision. he was evaluated by Dr. Denice Paradise in 2013 and at that time it was decided to watch and wait. Dr. Payton Doughty recommended a repeat CT scan and requested records from dura and regional operative records and would follow-up the patient, after Mr. Nodal PCP Dr. Gwenlyn Saran reviewed him for overall fitness for surgery. 09/04/16 patient's wound actually appear to be doing well on evaluation today. He is not really experience any discomfort and the right abdominal wound is dry and almost appears to be healing up. He continues to have some drainage from the left but this seems to be more of a potential seroma which with milking is able to be cleared out. I discussed with patient that when he performs his dressing changes at home it would be beneficial for him to do this as well. Otherwise there's no evidence of infection. He is still waiting on his CT scan and appointments to determine whether he is going to have another surgery. Objective Constitutional Well-nourished and well-hydrated in no acute distress. Vitals Time Taken: 9:43 AM, Height: 65 in, Weight: 161 lbs, BMI: 26.8, Temperature: 97.4 F, Pulse: 82 bpm, Respiratory Rate: 18 breaths/min, Blood Pressure: 144/63 mmHg. Respiratory normal breathing without difficulty. clear to auscultation bilaterally. Cardiovascular regular rate and rhythm with normal S1, S2. Psychiatric this patient is able to make decisions and demonstrates  good insight into disease process. Alert and Oriented x 3. pleasant and cooperative. General Notes: Patient's wounds appeared to be hyper granular at the surface although there is not significant drainage noted and overall there is no erythema which would be an indication of a infection. He does not have discomfort on palpation. Overall I'm pleased with how  this appears to be progressing on evaluation today. Integumentary (Hair, Skin) Wound #1 status is Open. Original cause of wound was Gradually Appeared. The wound is located on the Right Abdomen - midline. The wound measures 1.2cm length x 0.3cm width x 0.1cm depth; 0.283cm^2 area and 0.028cm^3 volume. The wound is limited to skin breakdown. There is no tunneling or undermining noted. There is a medium amount of serous drainage noted. The wound margin is flat and intact. There is TARL, CEPHAS ([id number]) no granulation within the wound bed. There is a large (67-100%) amount of necrotic tissue within the wound bed including Eschar. The periwound skin appearance exhibited: Scarring. The periwound skin appearance did not exhibit: Callus, Crepitus, Excoriation, Induration, Rash, Dry/Scaly, Maceration, Atrophie Blanche, Cyanosis, Ecchymosis, Hemosiderin Staining, Mottled, Pallor, Rubor, Erythema. Periwound temperature was noted as No Abnormality. Wound #2 status is Open. Original cause of wound was Gradually Appeared. The wound is located on the Left Abdomen - midline. The wound measures 4cm length x 1cm width x 0.1cm depth; 3.142cm^2 area and 0.314cm^3 volume. The wound is limited to skin breakdown. There is no tunneling or undermining noted. There is a large amount of purulent drainage noted. The wound margin is flat and intact. There is medium (34-66%) red granulation within the wound bed. There is a medium (34-66%) amount of necrotic tissue within the wound bed including Eschar and Adherent Slough. The periwound skin appearance  exhibited: Induration, Scarring, Ecchymosis. The periwound skin appearance did not exhibit: Callus, Crepitus, Excoriation, Rash, Dry/Scaly, Maceration, Atrophie Blanche, Cyanosis, Hemosiderin Staining, Mottled, Pallor, Rubor, Erythema. Periwound temperature was noted as No Abnormality. Assessment Active Problems ICD-10 E11.622 - Type 2 diabetes mellitus with other skin ulcer S31.100A - Unspecified open wound of abdominal wall, right upper quadrant without penetration into peritoneal cavity, initial encounter S31.102A - Unspecified open wound of abdominal wall, epigastric region without penetration into peritoneal cavity, initial encounter F17.228 - Nicotine dependence, chewing tobacco, with other nicotine-induced disorders Plan Wound Cleansing: Wound #1 Right Abdomen - midline: Clean wound with Normal Saline. May Shower, gently pat wound dry prior to applying new dressing. Wound #2 Left Abdomen - midline: Clean wound with Normal Saline. May Shower, gently pat wound dry prior to applying new dressing. Anesthetic: Wound #1 Right Abdomen - midline: Topical Lidocaine 4% cream applied to wound bed prior to debridement Wound #2 Left Abdomen - midline: Topical Lidocaine 4% cream applied to wound bed prior to debridement [name], [name] ([id number]) Skin Barriers/Peri-Wound Care: Wound #1 Right Abdomen - midline: Skin Prep Wound #2 Left Abdomen - midline: Skin Prep Primary Wound Dressing: Wound #1 Right Abdomen - midline: Hydrafera Blue Wound #2 Left Abdomen - midline: Hydrafera Blue Secondary Dressing: Wound #1 Right Abdomen - midline: Boardered Foam Dressing Wound #2 Left Abdomen - midline: Boardered Foam Dressing Dressing Change Frequency: Wound #1 Right Abdomen - midline: Change dressing every other day. Wound #2 Left Abdomen - midline: Change dressing every other day. Follow-up Appointments: Wound #1 Right Abdomen - midline: Return Appointment in 1 week. Wound #2 Left  Abdomen - midline: Return Appointment in 1 week. Additional Orders / Instructions: Wound #1 Right Abdomen - midline: Other: - Go see a Engineer, drilling about your hernias and get a surgical opinion. Wound #2 Left Abdomen - midline: Other: - Go see a Engineer, drilling about your hernias and get a surgical opinion. General Notes: I'm going to recommend that we continue with the [facility] Dressing for the next week. We will see were things stand  at that point. If anything worsens in the interim he will contact the office for additional recommendations. Otherwise we will wait his medical evaluation and CT scan to see whether or not he is going to need further surgery and whether or not they are going to be able to do it. Electronic Signature(s) Signed: 09/04/2016 5:09:57 PM By: Worthy Keeler PA-C Entered By: Worthy Keeler on 09/04/2016 10:14:26 [name] ([id number]) -------------------------------------------------------------------------------- SuperBill Details Patient Name: [name] Date of Service: 09/04/2016 Medical Record Number: [id number] Patient Account Number: [id number] Date of Birth/Sex: 11-Apr-1945 (72 y.o. Male) Treating RN: Carolyne Fiscal, Debi Primary Care Provider: Thomes Lolling Other Clinician: Referring Provider: Thomes Lolling Treating Provider/Extender: Sharalyn Ink in Treatment: 9 Diagnosis Coding ICD-10 Codes Code Description E11.622 Type 2 diabetes mellitus with other skin ulcer Unspecified open wound of abdominal wall, right upper quadrant without penetration into S31.100A peritoneal cavity, initial encounter Unspecified open wound of abdominal wall, epigastric region without penetration into S31.102A peritoneal cavity, initial encounter F17.228 Nicotine dependence, chewing tobacco, with other nicotine-induced disorders Facility Procedures CPT4 Code: [id number] Description: 99213 - WOUND CARE VISIT-LEV 3 EST PT Modifier: Quantity: 1 Physician  Procedures CPT4: Description Modifier Quantity Code [id number] 99213 - WC PHYS LEVEL 3 - EST PT 1 ICD-10 Description Diagnosis E11.622 Type 2 diabetes mellitus with other skin ulcer S31.100A Unspecified open wound of abdominal wall, right upper quadrant without  penetration into peritoneal cavity, initial encounter S31.102A Unspecified open wound of abdominal wall, epigastric region without penetration into peritoneal cavity, initial encounter F17.228 Nicotine dependence, chewing tobacco, with other  nicotine-induced disorders Electronic Signature(s) Signed: 09/04/2016 4:29:00 PM By: Alric Quan Signed: 09/04/2016 5:09:57 PM By: Worthy Keeler PA-C Entered By: Alric Quan on 09/04/2016 13:04:36

## 2016-09-05 NOTE — Progress Notes (Signed)
ATTICUS, WEDIN ([id number]) Visit Report for 09/04/2016 Arrival Information Details Patient Name: [name], [name]. Date of Service: 09/04/2016 9:45 AM Medical Record Number: [id number] Patient Account Number: [id number] Date of Birth/Sex: [birth year] (71 y.o. Male) Treating RN: Ahmed Prima Primary Care Lenix Benoist: Thomes Lolling Other Clinician: Referring Velna Hedgecock: Thomes Lolling Treating Camillia Marcy/Extender: Sharalyn Ink in Treatment: 9 Visit Information History Since Last Visit All ordered tests and consults were completed: No Patient Arrived: Ambulatory Added or deleted any medications: No Arrival Time: 09:42 Any new allergies or adverse reactions: No Accompanied By: self Had a fall or experienced change in No Transfer Assistance: None activities of daily living that may affect Patient Identification Verified: Yes risk of falls: Secondary Verification Process Yes Signs or symptoms of abuse/neglect since last No Completed: visito Patient Requires Transmission-Based No Hospitalized since last visit: No Precautions: Has Dressing in Place as Prescribed: Yes Patient Has Alerts: Yes Pain Present Now: No Patient Alerts: DMII Electronic Signature(s) Signed: 09/04/2016 4:29:00 PM By: Alric Quan Entered By: Alric Quan on 09/04/2016 09:42:53 [name] ([id number]) -------------------------------------------------------------------------------- Clinic Level of Care Assessment Details Patient Name: [name] Date of Service: 09/04/2016 9:45 AM Medical Record Number: [id number] Patient Account Number: [id number] Date of Birth/Sex: 1945/11/26 (71 y.o. Male) Treating RN: Carolyne Fiscal, Debi Primary Care Gasper Hopes: Thomes Lolling Other Clinician: Referring Hanaa Payes: Thomes Lolling Treating Arynn Armand/Extender: Sharalyn Ink in Treatment: 9 Clinic Level of Care Assessment Items TOOL 4 Quantity Score X - Use when only an EandM is performed on FOLLOW-UP visit  1 0 ASSESSMENTS - Nursing Assessment / Reassessment X - Reassessment of Co-morbidities (includes updates in patient status) 1 10 X - Reassessment of Adherence to Treatment Plan 1 5 ASSESSMENTS - Wound and Skin Assessment / Reassessment []  - Simple Wound Assessment / Reassessment - one wound 0 X - Complex Wound Assessment / Reassessment - multiple wounds 2 5 []  - Dermatologic / Skin Assessment (not related to wound area) 0 ASSESSMENTS - Focused Assessment []  - Circumferential Edema Measurements - multi extremities 0 []  - Nutritional Assessment / Counseling / Intervention 0 []  - Lower Extremity Assessment (monofilament, tuning fork, pulses) 0 []  - Peripheral Arterial Disease Assessment (using hand held doppler) 0 ASSESSMENTS - Ostomy and/or Continence Assessment and Care []  - Incontinence Assessment and Management 0 []  - Ostomy Care Assessment and Management (repouching, etc.) 0 PROCESS - Coordination of Care X - Simple Patient / Family Education for ongoing care 1 15 []  - Complex (extensive) Patient / Family Education for ongoing care 0 []  - Staff obtains Programmer, systems, Records, Test Results / Process Orders 0 []  - Staff telephones HHA, Nursing Homes / Clarify orders / etc 0 []  - Routine Transfer to another Facility (non-emergent condition) 0 [name], [name] ([id number]) []  - Routine Hospital Admission (non-emergent condition) 0 []  - New Admissions / Biomedical engineer / Ordering NPWT, Apligraf, etc. 0 []  - Emergency Hospital Admission (emergent condition) 0 X - Simple Discharge Coordination 1 10 []  - Complex (extensive) Discharge Coordination 0 PROCESS - Special Needs []  - Pediatric / Minor Patient Management 0 []  - Isolation Patient Management 0 []  - Hearing / Language / Visual special needs 0 []  - Assessment of Community assistance (transportation, D/C planning, etc.) 0 []  - Additional assistance / Altered mentation 0 []  - Support Surface(s) Assessment (bed, cushion, seat, etc.)  0 INTERVENTIONS - Wound Cleansing / Measurement []  - Simple Wound Cleansing - one wound 0 X - Complex Wound Cleansing - multiple wounds 2 5 X -  Wound Imaging (photographs - any number of wounds) 1 5 []  - Wound Tracing (instead of photographs) 0 []  - Simple Wound Measurement - one wound 0 X - Complex Wound Measurement - multiple wounds 2 5 INTERVENTIONS - Wound Dressings X - Small Wound Dressing one or multiple wounds 2 10 []  - Medium Wound Dressing one or multiple wounds 0 []  - Large Wound Dressing one or multiple wounds 0 X - Application of Medications - topical 1 5 []  - Application of Medications - injection 0 INTERVENTIONS - Miscellaneous []  - External ear exam 0 [name], [name] ([id number]) []  - Specimen Collection (cultures, biopsies, blood, body fluids, etc.) 0 []  - Specimen(s) / Culture(s) sent or taken to Lab for analysis 0 []  - Patient Transfer (multiple staff / Harrel Lemon Lift / Similar devices) 0 []  - Simple Staple / Suture removal (25 or less) 0 []  - Complex Staple / Suture removal (26 or more) 0 []  - Hypo / Hyperglycemic Management (close monitor of Blood Glucose) 0 []  - Ankle / Brachial Index (ABI) - do not check if billed separately 0 X - Vital Signs 1 5 Has the patient been seen at the hospital within the last three years: Yes Total Score: 105 Level Of Care: New/Established - Level 3 Electronic Signature(s) Signed: 09/04/2016 4:29:00 PM By: Alric Quan Entered By: Alric Quan on 09/04/2016 13:04:29 [name] ([id number]) -------------------------------------------------------------------------------- Encounter Discharge Information Details Patient Name: [name] Date of Service: 09/04/2016 9:45 AM Medical Record Number: [id number] Patient Account Number: [id number] Date of Birth/Sex: [birth year] (71 y.o. Male) Treating RN: Ahmed Prima Primary Care Salman Wellen: Thomes Lolling Other Clinician: Referring Madoline Bhatt: Thomes Lolling Treating  Trenell Concannon/Extender: Sharalyn Ink in Treatment: 9 Encounter Discharge Information Items Discharge Pain Level: 0 Discharge Condition: Stable Ambulatory Status: Ambulatory Discharge Destination: Home Transportation: Private Auto Accompanied By: self Schedule Follow-up Appointment: Yes Medication Reconciliation completed and provided to Patient/Care No Bluma Buresh: Provided on Clinical Summary of Care: 09/04/2016 Form Type Recipient Paper Patient FB Electronic Signature(s) Signed: 09/04/2016 10:08:19 AM By: [name] Entered By: [name] on 09/04/2016 10:08:18 [name] ([id number]) -------------------------------------------------------------------------------- Lower Extremity Assessment Details Patient Name: [name] Date of Service: 09/04/2016 9:45 AM Medical Record Number: [id number] Patient Account Number: [id number] Date of Birth/Sex: 12-11-1945 (72 y.o. Male) Treating RN: Ahmed Prima Primary Care Peyton Spengler: Thomes Lolling Other Clinician: Referring Rafaela Dinius: Thomes Lolling Treating Kameisha Malicki/Extender: Melburn Hake, HOYT Weeks in Treatment: 9 Electronic Signature(s) Signed: 09/04/2016 4:29:00 PM By: Alric Quan Entered By: Alric Quan on 09/04/2016 09:49:07 [name] ([id number]) -------------------------------------------------------------------------------- Multi Wound Chart Details Patient Name: [name] Date of Service: 09/04/2016 9:45 AM Medical Record Number: [id number] Patient Account Number: [id number] Date of Birth/Sex: [birth year] (71 y.o. Male) Treating RN: Carolyne Fiscal, Debi Primary Care Marina Desire: Thomes Lolling Other Clinician: Referring Camala Talwar: Thomes Lolling Treating Julienne Vogler/Extender: Melburn Hake, HOYT Weeks in Treatment: 9 Vital Signs Height(in): 65 Pulse(bpm): 82 Weight(lbs): 161 Blood Pressure 144/63 (mmHg): Body Mass Index(BMI): 27 Temperature(F): 97.4 Respiratory Rate 18 (breaths/min): Photos: [1:No  Photos] [2:No Photos] [N/A:N/A] Wound Location: [1:Right Abdomen - midline] [2:Left Abdomen - midline] [N/A:N/A] Wounding Event: [1:Gradually Appeared] [2:Gradually Appeared] [N/A:N/A] Primary Etiology: [1:Atypical] [2:Atypical] [N/A:N/A] Comorbid History: [1:Cataracts, Chronic Obstructive Pulmonary Disease (COPD), Hypertension, Type II Diabetes, Osteoarthritis, Neuropathy] [2:Cataracts, Chronic Obstructive Pulmonary Disease (COPD), Hypertension, Type II Diabetes, Osteoarthritis,  Neuropathy] [N/A:N/A] Date Acquired: [1:05/17/2015] [2:05/17/2015] [N/A:N/A] Weeks of Treatment: [1:9] [2:9] [N/A:N/A] Wound Status: [1:Open] [2:Open] [N/A:N/A] Measurements L x W x D 1.2x0.3x0.1 [2:4x1x0.1] [N/A:N/A] (cm) Area (  cm) : [1:0.283] [2:3.142] [N/A:N/A] Volume (cm) : [1:0.028] [2:0.314] [N/A:N/A] % Reduction in Area: [1:91.80%] [2:40.50%] [N/A:N/A] % Reduction in Volume: 91.80% [2:40.50%] [N/A:N/A] Classification: [1:Full Thickness Without Exposed Support Structures] [2:Full Thickness Without Exposed Support Structures] [N/A:N/A] Exudate Amount: [1:Medium] [2:Large] [N/A:N/A] Exudate Type: [1:Serous] [2:Purulent] [N/A:N/A] Exudate Color: [1:amber] [2:yellow, [place], green] [N/A:N/A] Wound Margin: [1:Flat and Intact] [2:Flat and Intact] [N/A:N/A] Granulation Amount: [1:None Present (0%)] [2:Medium (34-66%)] [N/A:N/A] Granulation Quality: [1:N/A] [2:Red] [N/A:N/A] Necrotic Amount: [1:Large (67-100%)] [2:Medium (34-66%)] [N/A:N/A] Necrotic Tissue: [1:Eschar] [2:Eschar, Adherent Slough] [N/A:N/A] Exposed Structures: Fascia: No Fascia: No N/A Fat Layer (Subcutaneous Fat Layer (Subcutaneous Tissue) Exposed: No Tissue) Exposed: No Tendon: No Tendon: No Muscle: No Muscle: No Joint: No Joint: No Bone: No Bone: No Limited to Skin Limited to Skin Breakdown Breakdown Epithelialization: Large (67-100%) Small (1-33%) N/A Periwound Skin Texture: Scarring: Yes Induration: Yes N/A Excoriation:  No Scarring: Yes Induration: No Excoriation: No Callus: No Callus: No Crepitus: No Crepitus: No Rash: No Rash: No Periwound Skin Maceration: No Maceration: No N/A Moisture: Dry/Scaly: No Dry/Scaly: No Periwound Skin Color: Atrophie Blanche: No Ecchymosis: Yes N/A Cyanosis: No Atrophie Blanche: No Ecchymosis: No Cyanosis: No Erythema: No Erythema: No Hemosiderin Staining: No Hemosiderin Staining: No Mottled: No Mottled: No Pallor: No Pallor: No Rubor: No Rubor: No Temperature: No Abnormality No Abnormality N/A Tenderness on No No N/A Palpation: Wound Preparation: Ulcer Cleansing: Ulcer Cleansing: N/A Rinsed/Irrigated with Rinsed/Irrigated with Saline Saline Topical Anesthetic Topical Anesthetic Applied: Other: lidocaine Applied: Other: lidocaine 4% 4% Treatment Notes Electronic Signature(s) Signed: 09/04/2016 4:29:00 PM By: Alric Quan Entered By: Alric Quan on 09/04/2016 09:49:21 [name] ([id number]) -------------------------------------------------------------------------------- Multi-Disciplinary Care Plan Details Patient Name: [name], [name] Date of Service: 09/04/2016 9:45 AM Medical Record Number: [id number] Patient Account Number: [id number] Date of Birth/Sex: 08-04-1945 (71 y.o. Male) Treating RN: Carolyne Fiscal, Debi Primary Care Savvas Roper: Thomes Lolling Other Clinician: Referring Katanya Schlie: Thomes Lolling Treating Shefali Ng/Extender: Melburn Hake, HOYT Weeks in Treatment: 9 Active Inactive ` Abuse / Safety / Falls / Self Care Management Nursing Diagnoses: Impaired physical mobility Goals: Patient will remain injury free Date Initiated: 07/03/2016 Target Resolution Date: 09/14/2016 Goal Status: Active Interventions: Assess fall risk on admission and as needed Notes: ` Orientation to the Wound Care Program Nursing Diagnoses: Knowledge deficit related to the wound healing center program Goals: Patient/caregiver will verbalize  understanding of the [facility] Program Date Initiated: 07/03/2016 Target Resolution Date: 09/15/2016 Goal Status: Active Interventions: Provide education on orientation to the wound center Notes: ` Wound/Skin Impairment Nursing Diagnoses: Impaired tissue integrity [name], [name] ([id number]) Goals: Patient/caregiver will verbalize understanding of skin care regimen Date Initiated: 07/03/2016 Target Resolution Date: 09/15/2016 Goal Status: Active Ulcer/skin breakdown will have a volume reduction of 30% by week 4 Date Initiated: 07/03/2016 Target Resolution Date: 09/15/2016 Goal Status: Active Ulcer/skin breakdown will have a volume reduction of 50% by week 8 Date Initiated: 07/03/2016 Target Resolution Date: 09/15/2016 Goal Status: Active Ulcer/skin breakdown will have a volume reduction of 80% by week 12 Date Initiated: 07/03/2016 Target Resolution Date: 09/15/2016 Goal Status: Active Ulcer/skin breakdown will heal within 14 weeks Date Initiated: 07/03/2016 Target Resolution Date: 09/15/2016 Goal Status: Active Interventions: Assess patient/caregiver ability to obtain necessary supplies Assess patient/caregiver ability to perform ulcer/skin care regimen upon admission and as needed Assess ulceration(s) every visit Notes: Electronic Signature(s) Signed: 09/04/2016 4:29:00 PM By: Alric Quan Entered By: Alric Quan on 09/04/2016 09:49:14 [name] ([id number]) -------------------------------------------------------------------------------- Pain Assessment Details Patient Name: [name] Date of  Service: 09/04/2016 9:45 AM Medical Record Number: [id number] Patient Account Number: [id number] Date of Birth/Sex: [birth year] (71 y.o. Male) Treating RN: Carolyne Fiscal, Debi Primary Care Jacquette Canales: Thomes Lolling Other Clinician: Referring Yoneko Talerico: Thomes Lolling Treating Kaprice Kage/Extender: Melburn Hake, HOYT Weeks in Treatment: 9 Active Problems Location of Pain  Severity and Description of Pain Patient Has Paino No Site Locations With Dressing Change: No Pain Management and Medication Current Pain Management: Electronic Signature(s) Signed: 09/04/2016 4:29:00 PM By: Alric Quan Entered By: Alric Quan on 09/04/2016 09:43:00 [name] ([id number]) -------------------------------------------------------------------------------- Patient/Caregiver Education Details Patient Name: [name] Date of Service: 09/04/2016 9:45 AM Medical Record Number: [id number] Patient Account Number: [id number] Date of Birth/Gender: Dec 12, 1945 (71 y.o. Male) Treating RN: Ahmed Prima Primary Care Physician: Thomes Lolling Other Clinician: Referring Physician: Thomes Lolling Treating Physician/Extender: Sharalyn Ink in Treatment: 9 Education Assessment Education Provided To: Patient Education Topics Provided Wound/Skin Impairment: Handouts: Other: change dressing as ordered Methods: Demonstration, Explain/Verbal Responses: State content correctly Electronic Signature(s) Signed: 09/04/2016 4:29:00 PM By: Alric Quan Entered By: Alric Quan on 09/04/2016 09:49:49 [name] ([id number]) -------------------------------------------------------------------------------- Wound Assessment Details Patient Name: [name] Date of Service: 09/04/2016 9:45 AM Medical Record Number: [id number] Patient Account Number: [id number] Date of Birth/Sex: 1946-02-29 (71 y.o. Male) Treating RN: Carolyne Fiscal, Debi Primary Care Jace Fermin: Thomes Lolling Other Clinician: Referring Jezreel Justiniano: Thomes Lolling Treating Tiffanye Hartmann/Extender: Melburn Hake, HOYT Weeks in Treatment: 9 Wound Status Wound Number: 1 Primary Atypical Etiology: Wound Location: Right Abdomen - midline Wound Open Wounding Event: Gradually Appeared Status: Date Acquired: 05/17/2015 Comorbid Cataracts, Chronic Obstructive Weeks Of Treatment: 9 History: Pulmonary Disease  (COPD), Clustered Wound: No Hypertension, Type II Diabetes, Osteoarthritis, Neuropathy Photos Photo Uploaded By: Alric Quan on 09/04/2016 10:12:40 Wound Measurements Length: (cm) 1.2 Width: (cm) 0.3 Depth: (cm) 0.1 Area: (cm) 0.283 Volume: (cm) 0.028 % Reduction in Area: 91.8% % Reduction in Volume: 91.8% Epithelialization: Large (67-100%) Tunneling: No Undermining: No Wound Description Full Thickness Without Exposed Classification: Support Structures Wound Margin: Flat and Intact Exudate Medium Amount: Exudate Type: Serous Exudate Color: amber Foul Odor After Cleansing: No Slough/Fibrino No Wound Bed Granulation Amount: None Present (0%) Exposed Structure [name], [name] ([id number]) Necrotic Amount: Large (67-100%) Fascia Exposed: No Necrotic Quality: Eschar Fat Layer (Subcutaneous Tissue) Exposed: No Tendon Exposed: No Muscle Exposed: No Joint Exposed: No Bone Exposed: No Limited to Skin Breakdown Periwound Skin Texture Texture Color No Abnormalities Noted: No No Abnormalities Noted: No Callus: No Atrophie Blanche: No Crepitus: No Cyanosis: No Excoriation: No Ecchymosis: No Induration: No Erythema: No Rash: No Hemosiderin Staining: No Scarring: Yes Mottled: No Pallor: No Moisture Rubor: No No Abnormalities Noted: No Dry / Scaly: No Temperature / Pain Maceration: No Temperature: No Abnormality Wound Preparation Ulcer Cleansing: Rinsed/Irrigated with Saline Topical Anesthetic Applied: Other: lidocaine 4%, Treatment Notes Wound #1 (Right Abdomen - midline) 1. Cleansed with: Clean wound with Normal Saline 2. Anesthetic Topical Lidocaine 4% cream to wound bed prior to debridement 3. Peri-wound Care: Skin Prep 4. Dressing Applied: Hydrafera Blue 5. Secondary Arthur Signature(s) Signed: 09/04/2016 4:29:00 PM By: Alric Quan Entered By: Alric Quan on 09/04/2016 09:48:38 [name]  ([id number]) -------------------------------------------------------------------------------- Wound Assessment Details Patient Name: [name], [name] Date of Service: 09/04/2016 9:45 AM Medical Record Number: [id number] Patient Account Number: [id number] Date of Birth/Sex: 1945/03/24 (71 y.o. Male) Treating RN: Carolyne Fiscal, Debi Primary Care Celso Granja: Thomes Lolling Other Clinician: Referring Danuel Felicetti: Thomes Lolling Treating Martina Brodbeck/Extender: Melburn Hake, HOYT Weeks in Treatment: 9 Wound  Status Wound Number: 2 Primary Atypical Etiology: Wound Location: Left Abdomen - midline Wound Open Wounding Event: Gradually Appeared Status: Date Acquired: 05/17/2015 Comorbid Cataracts, Chronic Obstructive Weeks Of Treatment: 9 History: Pulmonary Disease (COPD), Clustered Wound: No Hypertension, Type II Diabetes, Osteoarthritis, Neuropathy Photos Photo Uploaded By: Alric Quan on 09/04/2016 10:12:41 Wound Measurements Length: (cm) 4 Width: (cm) 1 Depth: (cm) 0.1 Area: (cm) 3.142 Volume: (cm) 0.314 % Reduction in Area: 40.5% % Reduction in Volume: 40.5% Epithelialization: Small (1-33%) Tunneling: No Undermining: No Wound Description Full Thickness Without Exposed Foul Odor Afte Classification: Support Structures Slough/Fibrino Wound Margin: Flat and Intact Exudate Large Amount: Exudate Type: Purulent Exudate Color: yellow, [place], green r Cleansing: No No Wound Bed Granulation Amount: Medium (34-66%) Exposed Structure [name], [name] ([id number]) Granulation Quality: Red Fascia Exposed: No Necrotic Amount: Medium (34-66%) Fat Layer (Subcutaneous Tissue) Exposed: No Necrotic Quality: Eschar, Adherent Slough Tendon Exposed: No Muscle Exposed: No Joint Exposed: No Bone Exposed: No Limited to Skin Breakdown Periwound Skin Texture Texture Color No Abnormalities Noted: No No Abnormalities Noted: No Callus: No Atrophie Blanche: No Crepitus: No Cyanosis: No Excoriation:  No Ecchymosis: Yes Induration: Yes Erythema: No Rash: No Hemosiderin Staining: No Scarring: Yes Mottled: No Pallor: No Moisture Rubor: No No Abnormalities Noted: No Dry / Scaly: No Temperature / Pain Maceration: No Temperature: No Abnormality Wound Preparation Ulcer Cleansing: Rinsed/Irrigated with Saline Topical Anesthetic Applied: Other: lidocaine 4%, Treatment Notes Wound #2 (Left Abdomen - midline) 1. Cleansed with: Clean wound with Normal Saline 2. Anesthetic Topical Lidocaine 4% cream to wound bed prior to debridement 3. Peri-wound Care: Skin Prep 4. Dressing Applied: Hydrafera Blue 5. Secondary Henderson Signature(s) Signed: 09/04/2016 4:29:00 PM By: Alric Quan Entered By: Alric Quan on 09/04/2016 09:48:54 [name] ([id number]) -------------------------------------------------------------------------------- [place] Details Patient Name: [name], [name] Date of Service: 09/04/2016 9:45 AM Medical Record Number: [id number] Patient Account Number: [id number] Date of Birth/Sex: 12/24/1945 (71 y.o. Male) Treating RN: Carolyne Fiscal, Debi Primary Care Maely Clements: Thomes Lolling Other Clinician: Referring Wendelin Reader: Thomes Lolling Treating Eliel Dudding/Extender: Melburn Hake, HOYT Weeks in Treatment: 9 Vital Signs Time Taken: 09:43 Temperature (F): 97.4 Height (in): 65 Pulse (bpm): 82 Weight (lbs): 161 Respiratory Rate (breaths/min): 18 Body Mass Index (BMI): 26.8 Blood Pressure (mmHg): 144/63 Reference Range: 80 - 120 mg / dl Electronic Signature(s) Signed: 09/04/2016 4:29:00 PM By: Alric Quan Entered By: Alric Quan on 09/04/2016 09:44:34

## 2016-09-11 ENCOUNTER — Encounter: Payer: Medicare Other | Attending: Surgery | Admitting: Surgery

## 2016-09-11 DIAGNOSIS — K219 Gastro-esophageal reflux disease without esophagitis: Secondary | ICD-10-CM | POA: Insufficient documentation

## 2016-09-11 DIAGNOSIS — E1142 Type 2 diabetes mellitus with diabetic polyneuropathy: Secondary | ICD-10-CM | POA: Insufficient documentation

## 2016-09-11 DIAGNOSIS — I10 Essential (primary) hypertension: Secondary | ICD-10-CM | POA: Diagnosis not present

## 2016-09-11 DIAGNOSIS — G8929 Other chronic pain: Secondary | ICD-10-CM | POA: Insufficient documentation

## 2016-09-11 DIAGNOSIS — S31102A Unspecified open wound of abdominal wall, epigastric region without penetration into peritoneal cavity, initial encounter: Secondary | ICD-10-CM | POA: Diagnosis not present

## 2016-09-11 DIAGNOSIS — J449 Chronic obstructive pulmonary disease, unspecified: Secondary | ICD-10-CM | POA: Diagnosis not present

## 2016-09-11 DIAGNOSIS — E538 Deficiency of other specified B group vitamins: Secondary | ICD-10-CM | POA: Diagnosis not present

## 2016-09-11 DIAGNOSIS — F17228 Nicotine dependence, chewing tobacco, with other nicotine-induced disorders: Secondary | ICD-10-CM | POA: Diagnosis not present

## 2016-09-11 DIAGNOSIS — M1990 Unspecified osteoarthritis, unspecified site: Secondary | ICD-10-CM | POA: Insufficient documentation

## 2016-09-11 DIAGNOSIS — X58XXXA Exposure to other specified factors, initial encounter: Secondary | ICD-10-CM | POA: Diagnosis not present

## 2016-09-11 DIAGNOSIS — Z79899 Other long term (current) drug therapy: Secondary | ICD-10-CM | POA: Diagnosis not present

## 2016-09-11 DIAGNOSIS — M549 Dorsalgia, unspecified: Secondary | ICD-10-CM | POA: Diagnosis not present

## 2016-09-11 DIAGNOSIS — Z79891 Long term (current) use of opiate analgesic: Secondary | ICD-10-CM | POA: Insufficient documentation

## 2016-09-11 DIAGNOSIS — E11622 Type 2 diabetes mellitus with other skin ulcer: Secondary | ICD-10-CM | POA: Insufficient documentation

## 2016-09-11 DIAGNOSIS — Z7984 Long term (current) use of oral hypoglycemic drugs: Secondary | ICD-10-CM | POA: Insufficient documentation

## 2016-09-11 DIAGNOSIS — S31100A Unspecified open wound of abdominal wall, right upper quadrant without penetration into peritoneal cavity, initial encounter: Secondary | ICD-10-CM | POA: Insufficient documentation

## 2016-09-12 NOTE — Progress Notes (Signed)
THANG, FLETT ([id number]) Visit Report for 09/11/2016 Chief Complaint Document Details Patient Name: [name]. Date of Service: 09/11/2016 9:45 AM Medical Record Number: [id number] Patient Account Number: [id number] Date of Birth/Sex: [birth year] (71 y.o. Male) Treating RN: Ahmed Prima Primary Care Provider: Thomes Lolling Other Clinician: Referring Provider: Thomes Lolling Treating Provider/Extender: Frann Rider in Treatment: 10 Information Obtained from: Patient Chief Complaint Patients presents for treatment of an open diabetic ulcer to the anterior abdominal wall in the epigastric and right upper quadrant Electronic Signature(s) Signed: 09/11/2016 10:07:27 AM By: Christin Fudge MD, FACS Entered By: Christin Fudge on 09/11/2016 10:07:26 [name] ([id number]) -------------------------------------------------------------------------------- HPI Details Patient Name: [name] Date of Service: 09/11/2016 9:45 AM Medical Record Number: [id number] Patient Account Number: [id number] Date of Birth/Sex: [birth year] (71 y.o. Male) Treating RN: Ahmed Prima Primary Care Provider: Thomes Lolling Other Clinician: Referring Provider: Thomes Lolling Treating Provider/Extender: Frann Rider in Treatment: 10 History of Present Illness Location: abdomen wall wounds,-- right upper quadrant and epigastric region Quality: Patient reports experiencing a dull pain to affected area(s). Severity: Patient states wound are getting worse. Duration: Patient has had the wound for >5 year's prior to seeking treatment at the wound center Timing: Pain in wound is Intermittent (comes and goes Context: The wound appeared gradually over time Modifying Factors: Consults to this date include:not seen a surgeon and only sees his PCP for his medical complaints Associated Signs and Symptoms: Patient reports having increase discharge. HPI Description: 71 year old gentleman has been seen  recently by his PCP Dr. Thomes Lolling, who sees him with a history of diabetes mellitus, peripheral neuropathy, B12 deficiency, hypertension, osteoarthritis and the patient had recent blood sugar checked which was 163. Past medical history includes essential hypertension, incisional hernia, diabetes mellitus, colon polyps, COPD, tobacco abuse in the past, chewing tobacco, GERD. He has also been treated for chronic back pain with no sciatica and is on oxycodone. His last hemoglobin A1c was 6.9% His abdominal surgery was done at [facility] over 15 years ago and gradually there was sutures which protruded and then there was mesh which protruded. He washes this during his shower but does not put any dressing and the wound is covered with a lot of debris. He has never had a surgical consultation for this. 07/10/2016 -- the patient has spoken to his PCP who said he was going to call me but I have not yet had a phone conversation with the physician. From what I understand the physician was reluctant to have a surgical opinion because of the patient's several comorbidities and he feels that the surgery may be detrimental to his overall health 07/24/2016 -- the patient's daughter is at the bedside and we've had a detailed discussion regarding his options for a surgical opinion and possible surgical intervention before this becomes a emergent situation. She will seek primary care input and take the father to [facility] for a surgical opinion. 07/31/2016 -- the patient's daughter has organized for a surgical opinion at [facility] on June 14 08/14/2016 -- it has been about 3 weeks since he has stopped chewing tobacco. 08/28/2016 -- he was seen at [facility] by Dr. Roby Lofts -- the assessment was based on physical examination and review of his previous history of surgical repair. the patient had undergone a ventral hernia repair in 1998 with Dr. Quay Burow at [facility] at a prior open  cholecystectomy incision. he was evaluated by Dr. Denice Paradise in 2013 and at that  time it was decided to watch and wait. Dr. Payton Doughty recommended a repeat CT scan and requested records from dura and regional operative records and would follow-up the patient, after Mr. [employer] PCP Dr. Gwenlyn Saran reviewed him for overall fitness for surgery. 09/04/16 patient's wound actually appear to be doing well on evaluation today. He is not really experience [name], [name]. ([id number]) any discomfort and the right abdominal wound is dry and almost appears to be healing up. He continues to have some drainage from the left but this seems to be more of a potential seroma which with milking is able to be cleared out. I discussed with patient that when he performs his dressing changes at home it would be beneficial for him to do this as well. Otherwise there's no evidence of infection. He is still waiting on his CT scan and appointments to determine whether he is going to have another surgery. 09/11/2016 -- the patient has had a CT scan and is awaiting his surgical appointment prior to deciding on any surgical intervention. Electronic Signature(s) Signed: 09/11/2016 10:07:55 AM By: Christin Fudge MD, FACS Entered By: Christin Fudge on 09/11/2016 10:07:54 [name] ([id number]) -------------------------------------------------------------------------------- Physician Orders Details Patient Name: [name], [name] Date of Service: 09/11/2016 9:45 AM Medical Record Number: [id number] Patient Account Number: [id number] Date of Birth/Sex: 1945/10/09 (71 y.o. Male) Treating RN: Carolyne Fiscal, Debi Primary Care Provider: Thomes Lolling Other Clinician: Referring Provider: Thomes Lolling Treating Provider/Extender: Frann Rider in Treatment: 10 Verbal / Phone Orders: Yes ClinicianCarolyne Fiscal, Debi Read Back and Verified: Yes Diagnosis Coding Wound Cleansing Wound #1 Right Abdomen - midline o Clean wound with Normal  Saline. o May Shower, gently pat wound dry prior to applying new dressing. Wound #2 Left Abdomen - midline o Clean wound with Normal Saline. o May Shower, gently pat wound dry prior to applying new dressing. Anesthetic Wound #1 Right Abdomen - midline o Topical Lidocaine 4% cream applied to wound bed prior to debridement Wound #2 Left Abdomen - midline o Topical Lidocaine 4% cream applied to wound bed prior to debridement Skin Barriers/Peri-Wound Care Wound #1 Right Abdomen - midline o Skin Prep Wound #2 Left Abdomen - midline o Skin Prep Primary Wound Dressing Wound #1 Right Abdomen - midline o Hydrafera Blue Wound #2 Left Abdomen - midline o Hydrafera Blue Secondary Dressing Wound #1 Right Abdomen - midline o Other - [place] Wound #2 Left Abdomen - midline o Other - telfa [address] [place] ([id number]) Dressing Change Frequency Wound #1 Right Abdomen - midline o Change dressing every other day. Wound #2 Left Abdomen - midline o Change dressing every other day. Follow-up Appointments Wound #1 Right Abdomen - midline o Return Appointment in 2 weeks. Wound #2 Left Abdomen - midline o Return Appointment in 2 weeks. Additional Orders / Instructions Wound #1 Right Abdomen - midline o Other: - Go see a Engineer, drilling about your hernias and get a surgical opinion. Wound #2 Left Abdomen - midline o Other: - Go see a Engineer, drilling about your hernias and get a surgical opinion. Electronic Signature(s) Signed: 09/11/2016 11:32:28 AM By: Christin Fudge MD, FACS Signed: 09/11/2016 4:51:42 PM By: Alric Quan Entered By: Alric Quan on 09/11/2016 10:02:42 [name] ([id number]) -------------------------------------------------------------------------------- Problem List Details Patient Name: [name], [name] Date of Service: 09/11/2016 9:45 AM Medical Record Number: [id number] Patient Account Number: [id number] Date of Birth/Sex:  October 14, 1945 (71 y.o. Male) Treating RN: Ahmed Prima Primary Care Provider: Thomes Lolling Other Clinician: Referring  Provider: Thomes Lolling Treating Provider/Extender: Frann Rider in Treatment: 10 Active Problems ICD-10 Encounter Code Description Active Date Diagnosis E11.622 Type 2 diabetes mellitus with other skin ulcer 07/03/2016 Yes S31.100A Unspecified open wound of abdominal wall, right upper 07/03/2016 Yes quadrant without penetration into peritoneal cavity, initial encounter S31.102A Unspecified open wound of abdominal wall, epigastric 07/03/2016 Yes region without penetration into peritoneal cavity, initial encounter F17.228 Nicotine dependence, chewing tobacco, with other 07/03/2016 Yes nicotine-induced disorders Inactive Problems Resolved Problems Electronic Signature(s) Signed: 09/11/2016 10:07:13 AM By: Christin Fudge MD, FACS Entered By: Christin Fudge on 09/11/2016 10:07:13 [name] ([id number]) -------------------------------------------------------------------------------- Progress Note Details Patient Name: [name] Date of Service: 09/11/2016 9:45 AM Medical Record Number: [id number] Patient Account Number: [id number] Date of Birth/Sex: 15-Apr-1945 (71 y.o. Male) Treating RN: Carolyne Fiscal, Debi Primary Care Provider: Thomes Lolling Other Clinician: Referring Provider: Thomes Lolling Treating Provider/Extender: Frann Rider in Treatment: 10 Subjective Chief Complaint Information obtained from Patient Patients presents for treatment of an open diabetic ulcer to the anterior abdominal wall in the epigastric and right upper quadrant History of Present Illness (HPI) The following HPI elements were documented for the patient's wound: Location: abdomen wall wounds,-- right upper quadrant and epigastric region Quality: Patient reports experiencing a dull pain to affected area(s). Severity: Patient states wound are getting worse. Duration:  Patient has had the wound for >5 year's prior to seeking treatment at the wound center Timing: Pain in wound is Intermittent (comes and goes Context: The wound appeared gradually over time Modifying Factors: Consults to this date include:not seen a surgeon and only sees his PCP for his medical complaints Associated Signs and Symptoms: Patient reports having increase discharge. 71 year old gentleman has been seen recently by his PCP Dr. Thomes Lolling, who sees him with a history of diabetes mellitus, peripheral neuropathy, B12 deficiency, hypertension, osteoarthritis and the patient had recent blood sugar checked which was 163. Past medical history includes essential hypertension, incisional hernia, diabetes mellitus, colon polyps, COPD, tobacco abuse in the past, chewing tobacco, GERD. He has also been treated for chronic back pain with no sciatica and is on oxycodone. His last hemoglobin A1c was 6.9% His abdominal surgery was done at [facility] over 15 years ago and gradually there was sutures which protruded and then there was mesh which protruded. He washes this during his shower but does not put any dressing and the wound is covered with a lot of debris. He has never had a surgical consultation for this. 07/10/2016 -- the patient has spoken to his PCP who said he was going to call me but I have not yet had a phone conversation with the physician. From what I understand the physician was reluctant to have a surgical opinion because of the patient's several comorbidities and he feels that the surgery may be detrimental to his overall health 07/24/2016 -- the patient's daughter is at the bedside and we've had a detailed discussion regarding his options for a surgical opinion and possible surgical intervention before this becomes a emergent situation. She will seek primary care input and take the father to [facility] for a surgical opinion. 07/31/2016 -- the patient's daughter has  organized for a surgical opinion at [facility] on June 14 08/14/2016 -- it has been about 3 weeks since he has stopped chewing tobacco. [name], [name] ([id number]) 08/28/2016 -- he was seen at [facility] by Dr. Roby Lofts -- the assessment was based on physical examination and review of his previous  history of surgical repair. the patient had undergone a ventral hernia repair in 1998 with Dr. Quay Burow at [facility] at a prior open cholecystectomy incision. he was evaluated by Dr. Denice Paradise in 2013 and at that time it was decided to watch and wait. Dr. Payton Doughty recommended a repeat CT scan and requested records from dura and regional operative records and would follow-up the patient, after Mr. [employer] PCP Dr. Gwenlyn Saran reviewed him for overall fitness for surgery. 09/04/16 patient's wound actually appear to be doing well on evaluation today. He is not really experience any discomfort and the right abdominal wound is dry and almost appears to be healing up. He continues to have some drainage from the left but this seems to be more of a potential seroma which with milking is able to be cleared out. I discussed with patient that when he performs his dressing changes at home it would be beneficial for him to do this as well. Otherwise there's no evidence of infection. He is still waiting on his CT scan and appointments to determine whether he is going to have another surgery. 09/11/2016 -- the patient has had a CT scan and is awaiting his surgical appointment prior to deciding on any surgical intervention. Objective Constitutional Vitals Time Taken: 9:50 AM, Height: 65 in, Weight: 161 lbs, BMI: 26.8, Temperature: 98.3 F, Pulse: 82 bpm, Respiratory Rate: 18 breaths/min, Blood Pressure: 161/77 mmHg. General Notes: Made Dr. Con Memos aware of pts BP. Pt states that he forgot to take his BP medication yesterday but he took it this morning. Integumentary (Hair, Skin) Wound #1 status is Open. Original  cause of wound was Gradually Appeared. The wound is located on the Right Abdomen - midline. The wound measures 0.1cm length x 0.1cm width x 0.1cm depth; 0.008cm^2 area and 0.001cm^3 volume. The wound is limited to skin breakdown. There is no tunneling or undermining noted. There is a medium amount of serous drainage noted. The wound margin is flat and intact. There is no granulation within the wound bed. There is a large (67-100%) amount of necrotic tissue within the wound bed including Eschar. The periwound skin appearance exhibited: Scarring. The periwound skin appearance did not exhibit: Callus, Crepitus, Excoriation, Induration, Rash, Dry/Scaly, Maceration, Atrophie Blanche, Cyanosis, Ecchymosis, Hemosiderin Staining, Mottled, Pallor, Rubor, Erythema. Periwound temperature was noted as No Abnormality. Wound #2 status is Open. Original cause of wound was Gradually Appeared. The wound is located on the Left Abdomen - midline. The wound measures 4cm length x 0.4cm width x 0.1cm depth; 1.257cm^2 area and 0.126cm^3 volume. The wound is limited to skin breakdown. There is no tunneling or undermining noted. There is a large amount of purulent drainage noted. The wound margin is flat and intact. There is [name], [name] ([id number]) medium (34-66%) red granulation within the wound bed. There is a medium (34-66%) amount of necrotic tissue within the wound bed including Eschar and Adherent Slough. The periwound skin appearance exhibited: Induration, Scarring, Ecchymosis. The periwound skin appearance did not exhibit: Callus, Crepitus, Excoriation, Rash, Dry/Scaly, Maceration, Atrophie Blanche, Cyanosis, Hemosiderin Staining, Mottled, Pallor, Rubor, Erythema. Periwound temperature was noted as No Abnormality. Assessment Active Problems ICD-10 E11.622 - Type 2 diabetes mellitus with other skin ulcer S31.100A - Unspecified open wound of abdominal wall, right upper quadrant without penetration  into peritoneal cavity, initial encounter S31.102A - Unspecified open wound of abdominal wall, epigastric region without penetration into peritoneal cavity, initial encounter F17.228 - Nicotine dependence, chewing tobacco, with other nicotine-induced disorders Plan Wound Cleansing: Wound #  1 Right Abdomen - midline: Clean wound with Normal Saline. May Shower, gently pat wound dry prior to applying new dressing. Wound #2 Left Abdomen - midline: Clean wound with Normal Saline. May Shower, gently pat wound dry prior to applying new dressing. Anesthetic: Wound #1 Right Abdomen - midline: Topical Lidocaine 4% cream applied to wound bed prior to debridement Wound #2 Left Abdomen - midline: Topical Lidocaine 4% cream applied to wound bed prior to debridement Skin Barriers/Peri-Wound Care: Wound #1 Right Abdomen - midline: Skin Prep Wound #2 Left Abdomen - midline: Skin Prep Primary Wound Dressing: Wound #1 Right Abdomen - midline: Hydrafera Blue Wound #2 Left Abdomen - midline: [employer] ([id number]) Secondary Dressing: Wound #1 Right Abdomen - midline: Other - [place] Wound #2 Left Abdomen - midline: Other - telfa [place] Dressing Change Frequency: Wound #1 Right Abdomen - midline: Change dressing every other day. Wound #2 Left Abdomen - midline: Change dressing every other day. Follow-up Appointments: Wound #1 Right Abdomen - midline: Return Appointment in 2 weeks. Wound #2 Left Abdomen - midline: Return Appointment in 2 weeks. Additional Orders / Instructions: Wound #1 Right Abdomen - midline: Other: - Go see a Engineer, drilling about your hernias and get a surgical opinion. Wound #2 Left Abdomen - midline: Other: - Go see a Engineer, drilling about your hernias and get a surgical opinion. We will await the CT scan review and further opinion from the surgeons. In the meanwhile I have recommended: 1. Hydrofera blue and a bordered foam to be applied daily after  washing with soap and water. 2. Good control of his diabetes mellitus 3. Adequate protein, vitamin A, vitamin C and zinc. 4. We will continue with supportive care and try and heal this wound to the best of our ability. he would like to spread out his visits as the patient is awaiting his surgical opinion. Electronic Signature(s) Signed: 09/11/2016 10:09:04 AM By: Christin Fudge MD, FACS Entered By: Christin Fudge on 09/11/2016 10:09:03 [name] ([id number]) -------------------------------------------------------------------------------- SuperBill Details Patient Name: [name], [name] Date of Service: 09/11/2016 Medical Record Number: [id number] Patient Account Number: [id number] Date of Birth/Sex: [birth year] (71 y.o. Male) Treating RN: Carolyne Fiscal, Debi Primary Care Provider: Thomes Lolling Other Clinician: Referring Provider: Thomes Lolling Treating Provider/Extender: Frann Rider in Treatment: 10 Diagnosis Coding ICD-10 Codes Code Description E11.622 Type 2 diabetes mellitus with other skin ulcer Unspecified open wound of abdominal wall, right upper quadrant without penetration into S31.100A peritoneal cavity, initial encounter Unspecified open wound of abdominal wall, epigastric region without penetration into S31.102A peritoneal cavity, initial encounter F17.228 Nicotine dependence, chewing tobacco, with other nicotine-induced disorders Facility Procedures CPT4 Code: [id number] Description: 99213 - WOUND CARE VISIT-LEV 3 EST PT Modifier: Quantity: 1 Physician Procedures CPT4: Description Modifier Quantity Code [id number] 37169 - WC PHYS LEVEL 3 - EST PT 1 ICD-10 Description Diagnosis E11.622 Type 2 diabetes mellitus with other skin ulcer S31.100A Unspecified open wound of abdominal wall, right upper quadrant without  penetration into peritoneal cavity, initial encounter S31.102A Unspecified open wound of abdominal wall, epigastric region without penetration into peritoneal  cavity, initial encounter Electronic Signature(s) Signed: 09/11/2016 4:25:24 PM By: Christin Fudge MD, FACS Signed: 09/11/2016 4:51:42 PM By: Alric Quan Previous Signature: 09/11/2016 10:10:14 AM Version By: Christin Fudge MD, FACS Previous Signature: 09/11/2016 10:09:15 AM Version By: Christin Fudge MD, FACS Entered By: Alric Quan on 09/11/2016 11:36:45

## 2016-09-12 NOTE — Progress Notes (Signed)
LUCCAS, TOWELL ([id number]) Visit Report for 09/11/2016 Arrival Information Details Patient Name: [name], [name]. Date of Service: 09/11/2016 9:45 AM Medical Record Number: [id number] Patient Account Number: [id number] Date of Birth/Sex: 12/24/1945 (71 y.o. Male) Treating RN: Ahmed Prima Primary Care Kendallyn Lippold: Thomes Lolling Other Clinician: Referring Shanele Nissan: Thomes Lolling Treating Calvert Charland/Extender: Frann Rider in Treatment: 10 Visit Information History Since Last Visit All ordered tests and consults were completed: No Patient Arrived: Ambulatory Added or deleted any medications: No Arrival Time: 09:48 Any new allergies or adverse reactions: No Accompanied By: self Had a fall or experienced change in No Transfer Assistance: None activities of daily living that may affect Patient Identification Verified: Yes risk of falls: Secondary Verification Process Yes Signs or symptoms of abuse/neglect since last No Completed: visito Patient Requires Transmission-Based No Hospitalized since last visit: No Precautions: Has Dressing in Place as Prescribed: Yes Patient Has Alerts: Yes Pain Present Now: No Patient Alerts: DMII Electronic Signature(s) Signed: 09/11/2016 4:51:42 PM By: Alric Quan Entered By: Alric Quan on 09/11/2016 09:49:10 [name] ([id number]) -------------------------------------------------------------------------------- Clinic Level of Care Assessment Details Patient Name: [name] Date of Service: 09/11/2016 9:45 AM Medical Record Number: [id number] Patient Account Number: [id number] Date of Birth/Sex: 02/15/1946 (71 y.o. Male) Treating RN: Carolyne Fiscal, Debi Primary Care Tanija Germani: Thomes Lolling Other Clinician: Referring Cecylia Brazill: Thomes Lolling Treating Laquesha Holcomb/Extender: Frann Rider in Treatment: 10 Clinic Level of Care Assessment Items TOOL 4 Quantity Score X - Use when only an EandM is performed on FOLLOW-UP visit 1  0 ASSESSMENTS - Nursing Assessment / Reassessment X - Reassessment of Co-morbidities (includes updates in patient status) 1 10 X - Reassessment of Adherence to Treatment Plan 1 5 ASSESSMENTS - Wound and Skin Assessment / Reassessment []  - Simple Wound Assessment / Reassessment - one wound 0 X - Complex Wound Assessment / Reassessment - multiple wounds 2 5 []  - Dermatologic / Skin Assessment (not related to wound area) 0 ASSESSMENTS - Focused Assessment []  - Circumferential Edema Measurements - multi extremities 0 []  - Nutritional Assessment / Counseling / Intervention 0 []  - Lower Extremity Assessment (monofilament, tuning fork, pulses) 0 []  - Peripheral Arterial Disease Assessment (using hand held doppler) 0 ASSESSMENTS - Ostomy and/or Continence Assessment and Care []  - Incontinence Assessment and Management 0 []  - Ostomy Care Assessment and Management (repouching, etc.) 0 PROCESS - Coordination of Care X - Simple Patient / Family Education for ongoing care 1 15 []  - Complex (extensive) Patient / Family Education for ongoing care 0 []  - Staff obtains Programmer, systems, Records, Test Results / Process Orders 0 []  - Staff telephones HHA, Nursing Homes / Clarify orders / etc 0 []  - Routine Transfer to another Facility (non-emergent condition) 0 [name], [name] ([id number]) []  - Routine Hospital Admission (non-emergent condition) 0 []  - New Admissions / Biomedical engineer / Ordering NPWT, Apligraf, etc. 0 []  - Emergency Hospital Admission (emergent condition) 0 X - Simple Discharge Coordination 1 10 []  - Complex (extensive) Discharge Coordination 0 PROCESS - Special Needs []  - Pediatric / Minor Patient Management 0 []  - Isolation Patient Management 0 []  - Hearing / Language / Visual special needs 0 []  - Assessment of Community assistance (transportation, D/C planning, etc.) 0 []  - Additional assistance / Altered mentation 0 []  - Support Surface(s) Assessment (bed, cushion, seat, etc.)  0 INTERVENTIONS - Wound Cleansing / Measurement []  - Simple Wound Cleansing - one wound 0 X - Complex Wound Cleansing - multiple wounds 2 5 X - Wound  Imaging (photographs - any number of wounds) 1 5 []  - Wound Tracing (instead of photographs) 0 []  - Simple Wound Measurement - one wound 0 X - Complex Wound Measurement - multiple wounds 2 5 INTERVENTIONS - Wound Dressings X - Small Wound Dressing one or multiple wounds 2 10 []  - Medium Wound Dressing one or multiple wounds 0 []  - Large Wound Dressing one or multiple wounds 0 X - Application of Medications - topical 1 5 []  - Application of Medications - injection 0 INTERVENTIONS - Miscellaneous []  - External ear exam 0 [name], [name] ([id number]) []  - Specimen Collection (cultures, biopsies, blood, body fluids, etc.) 0 []  - Specimen(s) / Culture(s) sent or taken to Lab for analysis 0 []  - Patient Transfer (multiple staff / Harrel Lemon Lift / Similar devices) 0 []  - Simple Staple / Suture removal (25 or less) 0 []  - Complex Staple / Suture removal (26 or more) 0 []  - Hypo / Hyperglycemic Management (close monitor of Blood Glucose) 0 []  - Ankle / Brachial Index (ABI) - do not check if billed separately 0 X - Vital Signs 1 5 Has the patient been seen at the hospital within the last three years: Yes Total Score: 105 Level Of Care: New/Established - Level 3 Electronic Signature(s) Signed: 09/11/2016 4:51:42 PM By: Alric Quan Entered By: Alric Quan on 09/11/2016 11:36:36 [name] ([id number]) -------------------------------------------------------------------------------- Encounter Discharge Information Details Patient Name: [name] Date of Service: 09/11/2016 9:45 AM Medical Record Number: [id number] Patient Account Number: [id number] Date of Birth/Sex: [birth year] (71 y.o. Male) Treating RN: Ahmed Prima Primary Care Jordan Caraveo: Thomes Lolling Other Clinician: Referring Blayne Frankie: Thomes Lolling Treating  Trinnity Breunig/Extender: Frann Rider in Treatment: 10 Encounter Discharge Information Items Discharge Pain Level: 0 Discharge Condition: Stable Ambulatory Status: Ambulatory Discharge Destination: Home Transportation: Private Auto Accompanied By: self Schedule Follow-up Appointment: Yes Medication Reconciliation completed and provided to Patient/Care No Wilfred Dayrit: Provided on Clinical Summary of Care: 09/11/2016 Form Type Recipient Paper Patient FB Electronic Signature(s) Signed: 09/11/2016 10:07:06 AM By: [name] Entered By: [name] on 09/11/2016 10:07:05 [name] ([id number]) -------------------------------------------------------------------------------- Lower Extremity Assessment Details Patient Name: [name] Date of Service: 09/11/2016 9:45 AM Medical Record Number: [id number] Patient Account Number: [id number] Date of Birth/Sex: 08-18-1945 (71 y.o. Male) Treating RN: Ahmed Prima Primary Care Jayston Trevino: Thomes Lolling Other Clinician: Referring Rinoa Garramone: Thomes Lolling Treating Wilder Kurowski/Extender: Frann Rider in Treatment: 10 Electronic Signature(s) Signed: 09/11/2016 4:51:42 PM By: Alric Quan Entered By: Alric Quan on 09/11/2016 09:55:57 Drzewiecki, Waneta Martins ([id number]) -------------------------------------------------------------------------------- Multi Wound Chart Details Patient Name: [name] Date of Service: 09/11/2016 9:45 AM Medical Record Number: [id number] Patient Account Number: [id number] Date of Birth/Sex: 11-Oct-1945 (71 y.o. Male) Treating RN: Ahmed Prima Primary Care Anees Vanecek: Thomes Lolling Other Clinician: Referring Marguerite Jarboe: Thomes Lolling Treating Macall Mccroskey/Extender: Frann Rider in Treatment: 10 Vital Signs Height(in): 65 Pulse(bpm): 82 Weight(lbs): 161 Blood Pressure 161/77 (mmHg): Body Mass Index(BMI): 27 Temperature(F): 98.3 Respiratory Rate 18 (breaths/min): Photos: [1:No Photos]  [2:No Photos] [N/A:N/A] Wound Location: [1:Right Abdomen - midline] [2:Left Abdomen - midline] [N/A:N/A] Wounding Event: [1:Gradually Appeared] [2:Gradually Appeared] [N/A:N/A] Primary Etiology: [1:Atypical] [2:Atypical] [N/A:N/A] Comorbid History: [1:Cataracts, Chronic Obstructive Pulmonary Disease (COPD), Hypertension, Type II Diabetes, Osteoarthritis, Neuropathy] [2:Cataracts, Chronic Obstructive Pulmonary Disease (COPD), Hypertension, Type II Diabetes, Osteoarthritis,  Neuropathy] [N/A:N/A] Date Acquired: [1:05/17/2015] [2:05/17/2015] [N/A:N/A] Weeks of Treatment: [1:10] [2:10] [N/A:N/A] Wound Status: [1:Open] [2:Open] [N/A:N/A] Measurements L x W x D 0.1x0.1x0.1 [2:4x0.4x0.1] [N/A:N/A] (cm) Area (cm) : [1:0.008] [2:1.257] [  N/A:N/A] Volume (cm) : [1:0.001] [2:0.126] [N/A:N/A] % Reduction in Area: [1:99.80%] [2:76.20%] [N/A:N/A] % Reduction in Volume: 99.70% [2:76.10%] [N/A:N/A] Classification: [1:Full Thickness Without Exposed Support Structures] [2:Full Thickness Without Exposed Support Structures] [N/A:N/A] Exudate Amount: [1:Medium] [2:Large] [N/A:N/A] Exudate Type: [1:Serous] [2:Purulent] [N/A:N/A] Exudate Color: [1:amber] [2:yellow, [place], green] [N/A:N/A] Wound Margin: [1:Flat and Intact] [2:Flat and Intact] [N/A:N/A] Granulation Amount: [1:None Present (0%)] [2:Medium (34-66%)] [N/A:N/A] Granulation Quality: [1:N/A] [2:Red] [N/A:N/A] Necrotic Amount: [1:Large (67-100%)] [2:Medium (34-66%)] [N/A:N/A] Necrotic Tissue: [1:Eschar] [2:Eschar, Adherent Slough] [N/A:N/A] Exposed Structures: Fascia: No Fascia: No N/A Fat Layer (Subcutaneous Fat Layer (Subcutaneous Tissue) Exposed: No Tissue) Exposed: No Tendon: No Tendon: No Muscle: No Muscle: No Joint: No Joint: No Bone: No Bone: No Limited to Skin Limited to Skin Breakdown Breakdown Epithelialization: Large (67-100%) Small (1-33%) N/A Periwound Skin Texture: Scarring: Yes Induration: Yes N/A Excoriation: No Scarring:  Yes Induration: No Excoriation: No Callus: No Callus: No Crepitus: No Crepitus: No Rash: No Rash: No Periwound Skin Maceration: No Maceration: No N/A Moisture: Dry/Scaly: No Dry/Scaly: No Periwound Skin Color: Atrophie Blanche: No Ecchymosis: Yes N/A Cyanosis: No Atrophie Blanche: No Ecchymosis: No Cyanosis: No Erythema: No Erythema: No Hemosiderin Staining: No Hemosiderin Staining: No Mottled: No Mottled: No Pallor: No Pallor: No Rubor: No Rubor: No Temperature: No Abnormality No Abnormality N/A Tenderness on No No N/A Palpation: Wound Preparation: Ulcer Cleansing: Ulcer Cleansing: N/A Rinsed/Irrigated with Rinsed/Irrigated with Saline Saline Topical Anesthetic Topical Anesthetic Applied: Other: lidocaine Applied: Other: lidocaine 4% 4% Treatment Notes Wound #1 (Right Abdomen - midline) 1. Cleansed with: Clean wound with Normal Saline 2. Anesthetic Topical Lidocaine 4% cream to wound bed prior to debridement 3. Peri-wound Care: Skin Prep 4. Dressing Applied: Hydrafera Blue 5. Secondary [place], [place] ([id number]) Wound #2 (Left Abdomen - midline) 1. Cleansed with: Clean wound with Normal Saline 2. Anesthetic Topical Lidocaine 4% cream to wound bed prior to debridement 3. Peri-wound Care: Skin Prep 4. Dressing Applied: Hydrafera Blue 5. Secondary [place] Signature(s) Signed: 09/11/2016 10:07:19 AM By: Christin Fudge MD, FACS Entered By: Christin Fudge on 09/11/2016 10:07:19 [name] ([id number]) -------------------------------------------------------------------------------- [place] Details Patient Name: [name], [name] Date of Service: 09/11/2016 9:45 AM Medical Record Number: [id number] Patient Account Number: [id number] Date of Birth/Sex: [birth year] (71 y.o. Male) Treating RN: Ahmed Prima Primary Care Fallan Mccarey: Thomes Lolling Other Clinician: Referring  Lawerence Dery: Thomes Lolling Treating Daylin Gruszka/Extender: Frann Rider in Treatment: 10 Active Inactive ` Abuse / Safety / Falls / Self Care Management Nursing Diagnoses: Impaired physical mobility Goals: Patient will remain injury free Date Initiated: 07/03/2016 Target Resolution Date: 09/14/2016 Goal Status: Active Interventions: Assess fall risk on admission and as needed Notes: ` Orientation to the Wound Care Program Nursing Diagnoses: Knowledge deficit related to the wound healing center program Goals: Patient/caregiver will verbalize understanding of the [place] Date Initiated: 07/03/2016 Target Resolution Date: 09/15/2016 Goal Status: Active Interventions: Provide education on orientation to the wound center Notes: ` Wound/Skin Impairment Nursing Diagnoses: Impaired tissue integrity [name], [name] ([id number]) Goals: Patient/caregiver will verbalize understanding of skin care regimen Date Initiated: 07/03/2016 Target Resolution Date: 09/15/2016 Goal Status: Active Ulcer/skin breakdown will have a volume reduction of 30% by week 4 Date Initiated: 07/03/2016 Target Resolution Date: 09/15/2016 Goal Status: Active Ulcer/skin breakdown will have a volume reduction of 50% by week 8 Date Initiated: 07/03/2016 Target Resolution Date: 09/15/2016 Goal Status: Active Ulcer/skin breakdown will have a volume reduction of 80% by week  12 Date Initiated: 07/03/2016 Target Resolution Date: 09/15/2016 Goal Status: Active Ulcer/skin breakdown will heal within 14 weeks Date Initiated: 07/03/2016 Target Resolution Date: 09/15/2016 Goal Status: Active Interventions: Assess patient/caregiver ability to obtain necessary supplies Assess patient/caregiver ability to perform ulcer/skin care regimen upon admission and as needed Assess ulceration(s) every visit Notes: Electronic Signature(s) Signed: 09/11/2016 4:51:42 PM By: Alric Quan Entered By: Alric Quan on  09/11/2016 09:59:50 [name] ([id number]) -------------------------------------------------------------------------------- Pain Assessment Details Patient Name: [name] Date of Service: 09/11/2016 9:45 AM Medical Record Number: [id number] Patient Account Number: [id number] Date of Birth/Sex: 02-03-1946 (71 y.o. Male) Treating RN: Ahmed Prima Primary Care Yacob Wilkerson: Thomes Lolling Other Clinician: Referring Jinan Biggins: Thomes Lolling Treating Christionna Poland/Extender: Frann Rider in Treatment: 10 Active Problems Location of Pain Severity and Description of Pain Patient Has Paino No Site Locations With Dressing Change: No Pain Management and Medication Current Pain Management: Electronic Signature(s) Signed: 09/11/2016 4:51:42 PM By: Alric Quan Entered By: Alric Quan on 09/11/2016 09:49:16 [name] ([id number]) -------------------------------------------------------------------------------- Patient/Caregiver Education Details Patient Name: [name] Date of Service: 09/11/2016 9:45 AM Medical Record Number: [id number] Patient Account Number: [id number] Date of Birth/Gender: 22-Aug-1945 (71 y.o. Male) Treating RN: Ahmed Prima Primary Care Physician: Thomes Lolling Other Clinician: Referring Physician: Thomes Lolling Treating Physician/Extender: Frann Rider in Treatment: 10 Education Assessment Education Provided To: Patient Education Topics Provided Wound/Skin Impairment: Handouts: Other: change dressing as ordered Electronic Signature(s) Signed: 09/11/2016 4:51:42 PM By: Alric Quan Entered By: Alric Quan on 09/11/2016 10:01:44 [name] ([id number]) -------------------------------------------------------------------------------- Wound Assessment Details Patient Name: [name] Date of Service: 09/11/2016 9:45 AM Medical Record Number: [id number] Patient Account Number: [id number] Date of Birth/Sex: 1945-10-18  (71 y.o. Male) Treating RN: Carolyne Fiscal, Debi Primary Care Junah Yam: Thomes Lolling Other Clinician: Referring Reagen Haberman: Thomes Lolling Treating Lexis Potenza/Extender: Frann Rider in Treatment: 10 Wound Status Wound Number: 1 Primary Atypical Etiology: Wound Location: Right Abdomen - midline Wound Open Wounding Event: Gradually Appeared Status: Date Acquired: 05/17/2015 Comorbid Cataracts, Chronic Obstructive Weeks Of Treatment: 10 History: Pulmonary Disease (COPD), Clustered Wound: No Hypertension, Type II Diabetes, Osteoarthritis, Neuropathy Wound Measurements Length: (cm) 0.1 Width: (cm) 0.1 Depth: (cm) 0.1 Area: (cm) 0.008 Volume: (cm) 0.001 % Reduction in Area: 99.8% % Reduction in Volume: 99.7% Epithelialization: Large (67-100%) Tunneling: No Undermining: No Wound Description Full Thickness Without Exposed Classification: Support Structures Wound Margin: Flat and Intact Exudate Medium Amount: Exudate Type: Serous Exudate Color: amber Foul Odor After Cleansing: No Slough/Fibrino No Wound Bed Granulation Amount: None Present (0%) Exposed Structure Necrotic Amount: Large (67-100%) Fascia Exposed: No Necrotic Quality: Eschar Fat Layer (Subcutaneous Tissue) Exposed: No Tendon Exposed: No Muscle Exposed: No Joint Exposed: No Bone Exposed: No Limited to Skin Breakdown Periwound Skin Texture Texture Color No Abnormalities Noted: No No Abnormalities Noted: No [name], [name]. ([id number]) Callus: No Atrophie Blanche: No Crepitus: No Cyanosis: No Excoriation: No Ecchymosis: No Induration: No Erythema: No Rash: No Hemosiderin Staining: No Scarring: Yes Mottled: No Pallor: No Moisture Rubor: No No Abnormalities Noted: No Dry / Scaly: No Temperature / Pain Maceration: No Temperature: No Abnormality Wound Preparation Ulcer Cleansing: Rinsed/Irrigated with Saline Topical Anesthetic Applied: Other: lidocaine 4%, Treatment Notes Wound #1 (Right  Abdomen - midline) 1. Cleansed with: Clean wound with Normal Saline 2. Anesthetic Topical Lidocaine 4% cream to wound bed prior to debridement 3. Peri-wound Care: Skin Prep 4. Dressing Applied: Hydrafera Blue 5. Secondary [place] Signature(s) Signed: 09/11/2016 4:51:42 PM By: Alric Quan  Entered By: Alric Quan on 09/11/2016 10:03:04 [name] ([id number]) -------------------------------------------------------------------------------- Wound Assessment Details Patient Name: [name], [place]. Date of Service: 09/11/2016 9:45 AM Medical Record Number: [id number] Patient Account Number: [id number] Date of Birth/Sex: Aug 27, 1945 (71 y.o. Male) Treating RN: Carolyne Fiscal, Debi Primary Care Elizebath Wever: Thomes Lolling Other Clinician: Referring Nyna Chilton: Thomes Lolling Treating Shanecia Hoganson/Extender: Frann Rider in Treatment: 10 Wound Status Wound Number: 2 Primary Atypical Etiology: Wound Location: Left Abdomen - midline Wound Open Wounding Event: Gradually Appeared Status: Date Acquired: 05/17/2015 Comorbid Cataracts, Chronic Obstructive Weeks Of Treatment: 10 History: Pulmonary Disease (COPD), Clustered Wound: No Hypertension, Type II Diabetes, Osteoarthritis, Neuropathy Wound Measurements Length: (cm) 4 Width: (cm) 0.4 Depth: (cm) 0.1 Area: (cm) 1.257 Volume: (cm) 0.126 % Reduction in Area: 76.2% % Reduction in Volume: 76.1% Epithelialization: Small (1-33%) Tunneling: No Undermining: No Wound Description Full Thickness Without Exposed Foul Odor Afte Classification: Support Structures Slough/Fibrino Wound Margin: Flat and Intact Exudate Large Amount: Exudate Type: Purulent Exudate Color: yellow, [place], green r Cleansing: No No Wound Bed Granulation Amount: Medium (34-66%) Exposed Structure Granulation Quality: Red Fascia Exposed: No Necrotic Amount: Medium (34-66%) Fat Layer (Subcutaneous Tissue) Exposed:  No Necrotic Quality: Eschar, Adherent Slough Tendon Exposed: No Muscle Exposed: No Joint Exposed: No Bone Exposed: No Limited to Skin Breakdown Periwound Skin Texture Texture Color No Abnormalities Noted: No No Abnormalities Noted: No [name], MUSA. ([id number]) Callus: No Atrophie Blanche: No Crepitus: No Cyanosis: No Excoriation: No Ecchymosis: Yes Induration: Yes Erythema: No Rash: No Hemosiderin Staining: No Scarring: Yes Mottled: No Pallor: No Moisture Rubor: No No Abnormalities Noted: No Dry / Scaly: No Temperature / Pain Maceration: No Temperature: No Abnormality Wound Preparation Ulcer Cleansing: Rinsed/Irrigated with Saline Topical Anesthetic Applied: Other: lidocaine 4%, Treatment Notes Wound #2 (Left Abdomen - midline) 1. Cleansed with: Clean wound with Normal Saline 2. Anesthetic Topical Lidocaine 4% cream to wound bed prior to debridement 3. Peri-wound Care: Skin Prep 4. Dressing Applied: Hydrafera Blue 5. Secondary [place] Signature(s) Signed: 09/11/2016 4:51:42 PM By: Alric Quan Entered By: Alric Quan on 09/11/2016 09:55:12 [name] ([id number]) -------------------------------------------------------------------------------- [place] Details Patient Name: [name], [name] Date of Service: 09/11/2016 9:45 AM Medical Record Number: [id number] Patient Account Number: [id number] Date of Birth/Sex: 06/03/1945 (70 y.o. Male) Treating RN: Carolyne Fiscal, Debi Primary Care Areon Cocuzza: Thomes Lolling Other Clinician: Referring Kortney Schoenfelder: Thomes Lolling Treating Zamar Odwyer/Extender: Frann Rider in Treatment: 10 Vital Signs Time Taken: 09:50 Temperature (F): 98.3 Height (in): 65 Pulse (bpm): 82 Weight (lbs): 161 Respiratory Rate (breaths/min): 18 Body Mass Index (BMI): 26.8 Blood Pressure (mmHg): 161/77 Reference Range: 80 - 120 mg / dl Notes Made Dr. Con Memos aware of pts BP. Pt states that he forgot  to take his BP medication yesterday but he took it this morning. Electronic Signature(s) Signed: 09/11/2016 4:51:42 PM By: Alric Quan Entered By: Alric Quan on 09/11/2016 09:51:15

## 2016-09-25 ENCOUNTER — Encounter: Payer: Medicare Other | Admitting: Physician Assistant

## 2016-09-25 DIAGNOSIS — E11622 Type 2 diabetes mellitus with other skin ulcer: Secondary | ICD-10-CM | POA: Diagnosis not present

## 2016-09-25 NOTE — Progress Notes (Signed)
[name], [name] ([id number]) Visit Report for 09/25/2016 Arrival Information Details Patient Name: [name], [name]. Date of Service: 09/25/2016 9:45 AM Medical Record Number: [id number] Patient Account Number: [id number] Date of Birth/Sex: 03-27-1945 (71 y.o. Male) Treating RN: Ahmed Prima Primary Care Louden Houseworth: Thomes Lolling Other Clinician: Referring Dayana Dalporto: Thomes Lolling Treating Arianna Haydon/Extender: Sharalyn Ink in Treatment: 12 Visit Information History Since Last Visit All ordered tests and consults were completed: No Patient Arrived: Ambulatory Added or deleted any medications: No Arrival Time: 09:35 Any new allergies or adverse reactions: No Accompanied By: SELF Had a fall or experienced change in No Transfer Assistance: None activities of daily living that may affect Patient Requires Transmission-Based No risk of falls: Precautions: Signs or symptoms of abuse/neglect since last No Patient Has Alerts: Yes visito Patient Alerts: DMII Hospitalized since last visit: No Has Dressing in Place as Prescribed: Yes Pain Present Now: No Electronic Signature(s) Signed: 09/25/2016 3:50:12 PM By: Alric Quan Entered By: Alric Quan on 09/25/2016 09:36:42 [name] ([id number]) -------------------------------------------------------------------------------- Clinic Level of Care Assessment Details Patient Name: [name] Date of Service: 09/25/2016 9:45 AM Medical Record Number: [id number] Patient Account Number: [id number] Date of Birth/Sex: [birth year] (71 y.o. Male) Treating RN: Carolyne Fiscal, Debi Primary Care Rhodie Cienfuegos: Thomes Lolling Other Clinician: Referring Tamesha Ellerbrock: Thomes Lolling Treating Nilsa Macht/Extender: Sharalyn Ink in Treatment: 12 Clinic Level of Care Assessment Items TOOL 4 Quantity Score X - Use when only an EandM is performed on FOLLOW-UP visit 1 0 ASSESSMENTS - Nursing Assessment / Reassessment X - Reassessment of  Co-morbidities (includes updates in patient status) 1 10 X - Reassessment of Adherence to Treatment Plan 1 5 ASSESSMENTS - Wound and Skin Assessment / Reassessment []  - Simple Wound Assessment / Reassessment - one wound 0 X - Complex Wound Assessment / Reassessment - multiple wounds 2 5 []  - Dermatologic / Skin Assessment (not related to wound area) 0 ASSESSMENTS - Focused Assessment []  - Circumferential Edema Measurements - multi extremities 0 []  - Nutritional Assessment / Counseling / Intervention 0 []  - Lower Extremity Assessment (monofilament, tuning fork, pulses) 0 []  - Peripheral Arterial Disease Assessment (using hand held doppler) 0 ASSESSMENTS - Ostomy and/or Continence Assessment and Care []  - Incontinence Assessment and Management 0 []  - Ostomy Care Assessment and Management (repouching, etc.) 0 PROCESS - Coordination of Care X - Simple Patient / Family Education for ongoing care 1 15 []  - Complex (extensive) Patient / Family Education for ongoing care 0 []  - Staff obtains Programmer, systems, Records, Test Results / Process Orders 0 []  - Staff telephones HHA, Nursing Homes / Clarify orders / etc 0 []  - Routine Transfer to another Facility (non-emergent condition) 0 [name], [name] ([id number]) []  - Routine Hospital Admission (non-emergent condition) 0 []  - New Admissions / Biomedical engineer / Ordering NPWT, Apligraf, etc. 0 []  - Emergency Hospital Admission (emergent condition) 0 X - Simple Discharge Coordination 1 10 []  - Complex (extensive) Discharge Coordination 0 PROCESS - Special Needs []  - Pediatric / Minor Patient Management 0 []  - Isolation Patient Management 0 []  - Hearing / Language / Visual special needs 0 []  - Assessment of Community assistance (transportation, D/C planning, etc.) 0 []  - Additional assistance / Altered mentation 0 []  - Support Surface(s) Assessment (bed, cushion, seat, etc.) 0 INTERVENTIONS - Wound Cleansing / Measurement []  - Simple Wound  Cleansing - one wound 0 X - Complex Wound Cleansing - multiple wounds 2 5 X - Wound Imaging (photographs - any number of wounds)  1 5 []  - Wound Tracing (instead of photographs) 0 []  - Simple Wound Measurement - one wound 0 X - Complex Wound Measurement - multiple wounds 2 5 INTERVENTIONS - Wound Dressings X - Small Wound Dressing one or multiple wounds 2 10 []  - Medium Wound Dressing one or multiple wounds 0 []  - Large Wound Dressing one or multiple wounds 0 X - Application of Medications - topical 1 5 []  - Application of Medications - injection 0 INTERVENTIONS - Miscellaneous []  - External ear exam 0 [name], [name] ([id number]) []  - Specimen Collection (cultures, biopsies, blood, body fluids, etc.) 0 []  - Specimen(s) / Culture(s) sent or taken to Lab for analysis 0 []  - Patient Transfer (multiple staff / Harrel Lemon Lift / Similar devices) 0 []  - Simple Staple / Suture removal (25 or less) 0 []  - Complex Staple / Suture removal (26 or more) 0 []  - Hypo / Hyperglycemic Management (close monitor of Blood Glucose) 0 []  - Ankle / Brachial Index (ABI) - do not check if billed separately 0 X - Vital Signs 1 5 Has the patient been seen at the hospital within the last three years: Yes Total Score: 105 Level Of Care: New/Established - Level 3 Electronic Signature(s) Signed: 09/25/2016 3:50:12 PM By: Alric Quan Entered By: Alric Quan on 09/25/2016 10:36:22 [name] ([id number]) -------------------------------------------------------------------------------- Encounter Discharge Information Details Patient Name: [name] Date of Service: 09/25/2016 9:45 AM Medical Record Number: [id number] Patient Account Number: [id number] Date of Birth/Sex: 1945/04/25 (71 y.o. Male) Treating RN: Ahmed Prima Primary Care Ladd Cen: Thomes Lolling Other Clinician: Referring Olamae Ferrara: Thomes Lolling Treating Archie Atilano/Extender: Sharalyn Ink in Treatment: 12 Encounter Discharge  Information Items Discharge Pain Level: 0 Discharge Condition: Stable Ambulatory Status: Ambulatory Discharge Destination: Home Transportation: Private Auto Accompanied By: self Schedule Follow-up Appointment: Yes Medication Reconciliation completed and provided to Patient/Care No Payden Bonus: Provided on Clinical Summary of Care: 09/25/2016 Form Type Recipient Paper Patient FB Electronic Signature(s) Signed: 09/25/2016 9:53:04 AM By: [name] Entered By: [name] on 09/25/2016 09:53:04 [name] ([id number]) -------------------------------------------------------------------------------- Lower Extremity Assessment Details Patient Name: [name] Date of Service: 09/25/2016 9:45 AM Medical Record Number: [id number] Patient Account Number: [id number] Date of Birth/Sex: [birth year] (71 y.o. Male) Treating RN: Ahmed Prima Primary Care Maurizio Geno: Thomes Lolling Other Clinician: Referring Conny Situ: Thomes Lolling Treating Camrin Gearheart/Extender: Melburn Hake, HOYT Weeks in Treatment: 12 Electronic Signature(s) Signed: 09/25/2016 3:50:12 PM By: Alric Quan Entered By: Alric Quan on 09/25/2016 09:45:48 [name] ([id number]) -------------------------------------------------------------------------------- Multi Wound Chart Details Patient Name: [name] Date of Service: 09/25/2016 9:45 AM Medical Record Number: [id number] Patient Account Number: [id number] Date of Birth/Sex: 10-20-1945 (71 y.o. Male) Treating RN: Carolyne Fiscal, Debi Primary Care Clayton Jarmon: Thomes Lolling Other Clinician: Referring Maddex Garlitz: Thomes Lolling Treating Abbrielle Batts/Extender: Melburn Hake, HOYT Weeks in Treatment: 12 Vital Signs Height(in): 65 Pulse(bpm): 90 Weight(lbs): 161 Blood Pressure 131/76 (mmHg): Body Mass Index(BMI): 27 Temperature(F): 98.0 Respiratory Rate 18 (breaths/min): Photos: [1:No Photos] [2:No Photos] [N/A:N/A] Wound Location: [1:Right Abdomen - midline]  [2:Left Abdomen - midline] [N/A:N/A] Wounding Event: [1:Gradually Appeared] [2:Gradually Appeared] [N/A:N/A] Primary Etiology: [1:Atypical] [2:Atypical] [N/A:N/A] Comorbid History: [1:Cataracts, Chronic Obstructive Pulmonary Disease (COPD), Hypertension, Type II Diabetes, Osteoarthritis, Neuropathy] [2:Cataracts, Chronic Obstructive Pulmonary Disease (COPD), Hypertension, Type II Diabetes, Osteoarthritis,  Neuropathy] [N/A:N/A] Date Acquired: [1:05/17/2015] [2:05/17/2015] [N/A:N/A] Weeks of Treatment: [1:12] [2:12] [N/A:N/A] Wound Status: [1:Open] [2:Open] [N/A:N/A] Measurements L x W x D 0.2x0.4x0.1 [2:4x0.4x0.1] [N/A:N/A] (cm) Area (cm) : [1:0.063] [2:1.257] [N/A:N/A] Volume (cm) : [  1:0.006] [2:0.126] [N/A:N/A] % Reduction in Area: [1:98.20%] [2:76.20%] [N/A:N/A] % Reduction in Volume: 98.30% [2:76.10%] [N/A:N/A] Classification: [1:Full Thickness Without Exposed Support Structures] [2:Full Thickness Without Exposed Support Structures] [N/A:N/A] Exudate Amount: [1:Medium] [2:Large] [N/A:N/A] Exudate Type: [1:Serous] [2:Purulent] [N/A:N/A] Exudate Color: [1:amber] [2:yellow, [place], green] [N/A:N/A] Wound Margin: [1:Flat and Intact] [2:Flat and Intact] [N/A:N/A] Granulation Amount: [1:None Present (0%)] [2:Medium (34-66%)] [N/A:N/A] Granulation Quality: [1:N/A] [2:Red] [N/A:N/A] Necrotic Amount: [1:Large (67-100%)] [2:Medium (34-66%)] [N/A:N/A] Necrotic Tissue: [1:Eschar] [2:Eschar, Adherent Slough] [N/A:N/A] Exposed Structures: Fascia: No Fascia: No N/A Fat Layer (Subcutaneous Fat Layer (Subcutaneous Tissue) Exposed: No Tissue) Exposed: No Tendon: No Tendon: No Muscle: No Muscle: No Joint: No Joint: No Bone: No Bone: No Limited to Skin Limited to Skin Breakdown Breakdown Epithelialization: Large (67-100%) Small (1-33%) N/A Periwound Skin Texture: Scarring: Yes Induration: Yes N/A Excoriation: No Scarring: Yes Induration: No Excoriation: No Callus: No Callus:  No Crepitus: No Crepitus: No Rash: No Rash: No Periwound Skin Maceration: No Maceration: No N/A Moisture: Dry/Scaly: No Dry/Scaly: No Periwound Skin Color: Atrophie Blanche: No Ecchymosis: Yes N/A Cyanosis: No Atrophie Blanche: No Ecchymosis: No Cyanosis: No Erythema: No Erythema: No Hemosiderin Staining: No Hemosiderin Staining: No Mottled: No Mottled: No Pallor: No Pallor: No Rubor: No Rubor: No Temperature: No Abnormality No Abnormality N/A Tenderness on No No N/A Palpation: Wound Preparation: Ulcer Cleansing: Ulcer Cleansing: N/A Rinsed/Irrigated with Rinsed/Irrigated with Saline Saline Topical Anesthetic Topical Anesthetic Applied: Other: lidocaine Applied: Other: lidocaine 4% 4% Treatment Notes Electronic Signature(s) Signed: 09/25/2016 3:50:12 PM By: Alric Quan Entered By: Alric Quan on 09/25/2016 09:46:01 [name] ([id number]) -------------------------------------------------------------------------------- Multi-Disciplinary Care Plan Details Patient Name: [name], [name] Date of Service: 09/25/2016 9:45 AM Medical Record Number: [id number] Patient Account Number: [id number] Date of Birth/Sex: [birth year] (71 y.o. Male) Treating RN: Carolyne Fiscal, Debi Primary Care Laronda Lisby: Thomes Lolling Other Clinician: Referring Aruna Nestler: Thomes Lolling Treating Sherlie Boyum/Extender: Melburn Hake, HOYT Weeks in Treatment: 12 Active Inactive ` Abuse / Safety / Falls / Self Care Management Nursing Diagnoses: Impaired physical mobility Goals: Patient will remain injury free Date Initiated: 07/03/2016 Target Resolution Date: 09/14/2016 Goal Status: Active Interventions: Assess fall risk on admission and as needed Notes: ` Orientation to the Wound Care Program Nursing Diagnoses: Knowledge deficit related to the wound healing center program Goals: Patient/caregiver will verbalize understanding of the [facility] Program Date Initiated:  07/03/2016 Target Resolution Date: 09/15/2016 Goal Status: Active Interventions: Provide education on orientation to the wound center Notes: ` Wound/Skin Impairment Nursing Diagnoses: Impaired tissue integrity [name], [name] ([id number]) Goals: Patient/caregiver will verbalize understanding of skin care regimen Date Initiated: 07/03/2016 Target Resolution Date: 09/15/2016 Goal Status: Active Ulcer/skin breakdown will have a volume reduction of 30% by week 4 Date Initiated: 07/03/2016 Target Resolution Date: 09/15/2016 Goal Status: Active Ulcer/skin breakdown will have a volume reduction of 50% by week 8 Date Initiated: 07/03/2016 Target Resolution Date: 09/15/2016 Goal Status: Active Ulcer/skin breakdown will have a volume reduction of 80% by week 12 Date Initiated: 07/03/2016 Target Resolution Date: 09/15/2016 Goal Status: Active Ulcer/skin breakdown will heal within 14 weeks Date Initiated: 07/03/2016 Target Resolution Date: 09/15/2016 Goal Status: Active Interventions: Assess patient/caregiver ability to obtain necessary supplies Assess patient/caregiver ability to perform ulcer/skin care regimen upon admission and as needed Assess ulceration(s) every visit Notes: Electronic Signature(s) Signed: 09/25/2016 3:50:12 PM By: Alric Quan Entered By: Alric Quan on 09/25/2016 [place], [place]. ([id number]) -------------------------------------------------------------------------------- Pain Assessment Details Patient Name: [name] Date of Service: 09/25/2016 9:45 AM Medical Record Number: [id number]  Patient Account Number: [id number] Date of Birth/Sex: 1946/01/07 (71 y.o. Male) Treating RN: Carolyne Fiscal, Debi Primary Care Sia Gabrielsen: Thomes Lolling Other Clinician: Referring Sabrina Arriaga: Thomes Lolling Treating Rockwell Zentz/Extender: Melburn Hake, HOYT Weeks in Treatment: 12 Active Problems Location of Pain Severity and Description of Pain Patient Has Paino No Site  Locations With Dressing Change: No Pain Management and Medication Current Pain Management: Electronic Signature(s) Signed: 09/25/2016 3:50:12 PM By: Alric Quan Entered By: Alric Quan on 09/25/2016 09:36:49 [name] ([id number]) -------------------------------------------------------------------------------- Patient/Caregiver Education Details Patient Name: [name] Date of Service: 09/25/2016 9:45 AM Medical Record Number: [id number] Patient Account Number: [id number] Date of Birth/Gender: September 19, 1945 (71 y.o. Male) Treating RN: Ahmed Prima Primary Care Physician: Thomes Lolling Other Clinician: Referring Physician: Thomes Lolling Treating Physician/Extender: Sharalyn Ink in Treatment: 12 Education Assessment Education Provided To: Patient Education Topics Provided Wound/Skin Impairment: Handouts: Other: change dressing as ordered Methods: Demonstration, Explain/Verbal Responses: State content correctly Electronic Signature(s) Signed: 09/25/2016 3:50:12 PM By: Alric Quan Entered By: Alric Quan on 09/25/2016 09:46:29 [name] ([id number]) -------------------------------------------------------------------------------- Wound Assessment Details Patient Name: [name] Date of Service: 09/25/2016 9:45 AM Medical Record Number: [id number] Patient Account Number: [id number] Date of Birth/Sex: February 22, 1946 (71 y.o. Male) Treating RN: Carolyne Fiscal, Debi Primary Care Mithcell Schumpert: Thomes Lolling Other Clinician: Referring Egypt Marchiano: Thomes Lolling Treating Liset Mcmonigle/Extender: Melburn Hake, HOYT Weeks in Treatment: 12 Wound Status Wound Number: 1 Primary Atypical Etiology: Wound Location: Right Abdomen - midline Wound Open Wounding Event: Gradually Appeared Status: Date Acquired: 05/17/2015 Comorbid Cataracts, Chronic Obstructive Weeks Of Treatment: 12 History: Pulmonary Disease (COPD), Clustered Wound: No Hypertension, Type II  Diabetes, Osteoarthritis, Neuropathy Photos Photo Uploaded By: Alric Quan on 09/25/2016 13:20:47 Wound Measurements Length: (cm) 0.2 Width: (cm) 0.4 Depth: (cm) 0.1 Area: (cm) 0.063 Volume: (cm) 0.006 % Reduction in Area: 98.2% % Reduction in Volume: 98.3% Epithelialization: Large (67-100%) Tunneling: No Undermining: No Wound Description Full Thickness Without Exposed Classification: Support Structures Wound Margin: Flat and Intact Exudate Medium Amount: Exudate Type: Serous Exudate Color: amber Foul Odor After Cleansing: No Slough/Fibrino No Wound Bed Granulation Amount: None Present (0%) Exposed Structure [name], [name] ([id number]) Necrotic Amount: Large (67-100%) Fascia Exposed: No Necrotic Quality: Eschar Fat Layer (Subcutaneous Tissue) Exposed: No Tendon Exposed: No Muscle Exposed: No Joint Exposed: No Bone Exposed: No Limited to Skin Breakdown Periwound Skin Texture Texture Color No Abnormalities Noted: No No Abnormalities Noted: No Callus: No Atrophie Blanche: No Crepitus: No Cyanosis: No Excoriation: No Ecchymosis: No Induration: No Erythema: No Rash: No Hemosiderin Staining: No Scarring: Yes Mottled: No Pallor: No Moisture Rubor: No No Abnormalities Noted: No Dry / Scaly: No Temperature / Pain Maceration: No Temperature: No Abnormality Wound Preparation Ulcer Cleansing: Rinsed/Irrigated with Saline Topical Anesthetic Applied: Other: lidocaine 4%, Treatment Notes Wound #1 (Right Abdomen - midline) 1. Cleansed with: Clean wound with Normal Saline 2. Anesthetic Topical Lidocaine 4% cream to wound bed prior to debridement 3. Peri-wound Care: Skin Prep 4. Dressing Applied: Hydrafera Blue 5. Secondary [place] Signature(s) Signed: 09/25/2016 3:50:12 PM By: Alric Quan Entered By: Alric Quan on 09/25/2016 09:41:40 [name]  ([id number]) -------------------------------------------------------------------------------- Wound Assessment Details Patient Name: [name], [name] Date of Service: 09/25/2016 9:45 AM Medical Record Number: [id number] Patient Account Number: [id number] Date of Birth/Sex: 28-Sep-1945 (71 y.o. Male) Treating RN: Carolyne Fiscal, Debi Primary Care Jisel Fleet: Thomes Lolling Other Clinician: Referring Lakrisha Iseman: Thomes Lolling Treating Midas Daughety/Extender: Melburn Hake, HOYT Weeks in Treatment: 12 Wound Status Wound Number: 2 Primary Atypical Etiology: Wound  Location: Left Abdomen - midline Wound Open Wounding Event: Gradually Appeared Status: Date Acquired: 05/17/2015 Comorbid Cataracts, Chronic Obstructive Weeks Of Treatment: 12 History: Pulmonary Disease (COPD), Clustered Wound: No Hypertension, Type II Diabetes, Osteoarthritis, Neuropathy Photos Photo Uploaded By: Alric Quan on 09/25/2016 13:21:29 Wound Measurements Length: (cm) 4 Width: (cm) 0.4 Depth: (cm) 0.1 Area: (cm) 1.257 Volume: (cm) 0.126 % Reduction in Area: 76.2% % Reduction in Volume: 76.1% Epithelialization: Small (1-33%) Tunneling: No Undermining: No Wound Description Full Thickness Without Exposed Foul Odor Afte Classification: Support Structures Slough/Fibrino Wound Margin: Flat and Intact Exudate Large Amount: Exudate Type: Purulent Exudate Color: yellow, [place], green r Cleansing: No No Wound Bed Granulation Amount: Medium (34-66%) Exposed Structure [name], [name] ([id number]) Granulation Quality: Red Fascia Exposed: No Necrotic Amount: Medium (34-66%) Fat Layer (Subcutaneous Tissue) Exposed: No Necrotic Quality: Eschar, Adherent Slough Tendon Exposed: No Muscle Exposed: No Joint Exposed: No Bone Exposed: No Limited to Skin Breakdown Periwound Skin Texture Texture Color No Abnormalities Noted: No No Abnormalities Noted: No Callus: No Atrophie Blanche: No Crepitus: No Cyanosis:  No Excoriation: No Ecchymosis: Yes Induration: Yes Erythema: No Rash: No Hemosiderin Staining: No Scarring: Yes Mottled: No Pallor: No Moisture Rubor: No No Abnormalities Noted: No Dry / Scaly: No Temperature / Pain Maceration: No Temperature: No Abnormality Wound Preparation Ulcer Cleansing: Rinsed/Irrigated with Saline Topical Anesthetic Applied: Other: lidocaine 4%, Treatment Notes Wound #2 (Left Abdomen - midline) 1. Cleansed with: Clean wound with Normal Saline 2. Anesthetic Topical Lidocaine 4% cream to wound bed prior to debridement 3. Peri-wound Care: Skin Prep 4. Dressing Applied: Hydrafera Blue 5. Secondary [place] Signature(s) Signed: 09/25/2016 3:50:12 PM By: Alric Quan Entered By: Alric Quan on 09/25/2016 09:41:59 [name] ([id number]) -------------------------------------------------------------------------------- [place] Details Patient Name: [name], [name] Date of Service: 09/25/2016 9:45 AM Medical Record Number: [id number] Patient Account Number: [id number] Date of Birth/Sex: November 24, 1945 (70 y.o. Male) Treating RN: Carolyne Fiscal, Debi Primary Care Dorian Renfro: Thomes Lolling Other Clinician: Referring Mirielle Byrum: Thomes Lolling Treating Porshia Blizzard/Extender: Melburn Hake, HOYT Weeks in Treatment: 12 Vital Signs Time Taken: 09:37 Temperature (F): 98.0 Height (in): 65 Pulse (bpm): 90 Weight (lbs): 161 Respiratory Rate (breaths/min): 18 Body Mass Index (BMI): 26.8 Blood Pressure (mmHg): 131/76 Reference Range: 80 - 120 mg / dl Electronic Signature(s) Signed: 09/25/2016 3:50:12 PM By: Alric Quan Entered By: Alric Quan on 09/25/2016 09:38:02

## 2016-09-25 NOTE — Progress Notes (Signed)
[name], [name] ([id number]) Visit Report for 09/25/2016 Chief Complaint Document Details Patient Name: [name], [name]. Date of Service: 09/25/2016 9:45 AM Medical Record Number: [id number] Patient Account Number: [id number] Date of Birth/Sex: 23-Jun-1945 (71 y.o. Male) Treating RN: Ahmed Prima Primary Care Provider: Thomes Lolling Other Clinician: Referring Provider: Thomes Lolling Treating Provider/Extender: Sharalyn Ink in Treatment: 12 Information Obtained from: Patient Chief Complaint Patients presents for treatment of an open diabetic ulcer to the anterior abdominal wall in the epigastric and right upper quadrant Electronic Signature(s) Signed: 09/25/2016 3:36:45 PM By: Worthy Keeler PA-C Entered By: Worthy Keeler on 09/25/2016 09:45:28 [name] ([id number]) -------------------------------------------------------------------------------- HPI Details Patient Name: [name] Date of Service: 09/25/2016 9:45 AM Medical Record Number: [id number] Patient Account Number: [id number] Date of Birth/Sex: 01/10/1946 (71 y.o. Male) Treating RN: Carolyne Fiscal, Debi Primary Care Provider: Thomes Lolling Other Clinician: Referring Provider: Thomes Lolling Treating Provider/Extender: Melburn Hake, HOYT Weeks in Treatment: 12 History of Present Illness Location: abdomen wall wounds,-- right upper quadrant and epigastric region Quality: Patient reports experiencing a dull pain to affected area(s). Severity: Patient states wound are getting worse. Duration: Patient has had the wound for >5 year's prior to seeking treatment at the wound center Timing: Pain in wound is Intermittent (comes and goes Context: The wound appeared gradually over time Modifying Factors: Consults to this date include:not seen a surgeon and only sees his PCP for his medical complaints Associated Signs and Symptoms: Patient reports having increase discharge. HPI Description: 71 year old gentleman has been  seen recently by his PCP Dr. Thomes Lolling, who sees him with a history of diabetes mellitus, peripheral neuropathy, B12 deficiency, hypertension, osteoarthritis and the patient had recent blood sugar checked which was 163. Past medical history includes essential hypertension, incisional hernia, diabetes mellitus, colon polyps, COPD, tobacco abuse in the past, chewing tobacco, GERD. He has also been treated for chronic back pain with no sciatica and is on oxycodone. His last hemoglobin A1c was 6.9% His abdominal surgery was done at [facility] over 15 years ago and gradually there was sutures which protruded and then there was mesh which protruded. He washes this during his shower but does not put any dressing and the wound is covered with a lot of debris. He has never had a surgical consultation for this. 07/10/2016 -- the patient has spoken to his PCP who said he was going to call me but I have not yet had a phone conversation with the physician. From what I understand the physician was reluctant to have a surgical opinion because of the patient's several comorbidities and he feels that the surgery may be detrimental to his overall health 07/24/2016 -- the patient's daughter is at the bedside and we've had a detailed discussion regarding his options for a surgical opinion and possible surgical intervention before this becomes a emergent situation. She will seek primary care input and take the father to [facility] for a surgical opinion. 07/31/2016 -- the patient's daughter has organized for a surgical opinion at [facility] on June 14 08/14/2016 -- it has been about 3 weeks since he has stopped chewing tobacco. 08/28/2016 -- he was seen at [facility] by Dr. Roby Lofts -- the assessment was based on physical examination and review of his previous history of surgical repair. the patient had undergone a ventral hernia repair in 1998 with Dr. Quay Burow at [facility] at a prior open  cholecystectomy incision. he was evaluated by Dr. Denice Paradise in 2013  and at that time it was decided to watch and wait. Dr. Payton Doughty recommended a repeat CT scan and requested records from dura and regional operative records and would follow-up the patient, after Mr. Lagrand PCP Dr. Gwenlyn Saran reviewed him for overall fitness for surgery. 09/04/16 patient's wound actually appear to be doing well on evaluation today. He is not really experience [name], [name]. ([id number]) any discomfort and the right abdominal wound is dry and almost appears to be healing up. He continues to have some drainage from the left but this seems to be more of a potential seroma which with milking is able to be cleared out. I discussed with patient that when he performs his dressing changes at home it would be beneficial for him to do this as well. Otherwise there's no evidence of infection. He is still waiting on his CT scan and appointments to determine whether he is going to have another surgery. 09/11/2016 -- the patient has had a CT scan and is awaiting his surgical appointment prior to deciding on any surgical intervention. 09/25/16 patient presents today for fault evaluation concerning his ongoing abdominal surgical wounds. Unfortunately they do not appear to be improving significantly but rather fluctuate from a little better to little worse week by week. Obviously this is somewhat frustrating for him. Electronic Signature(s) Signed: 09/25/2016 3:36:45 PM By: Worthy Keeler PA-C Entered By: Worthy Keeler on 09/25/2016 09:54:30 [name] ([id number]) -------------------------------------------------------------------------------- Physical Exam Details Patient Name: [name], [name] Date of Service: 09/25/2016 9:45 AM Medical Record Number: [id number] Patient Account Number: [id number] Date of Birth/Sex: 1945/10/19 (71 y.o. Male) Treating RN: Carolyne Fiscal, Debi Primary Care Provider: Thomes Lolling Other  Clinician: Referring Provider: Thomes Lolling Treating Provider/Extender: Melburn Hake, HOYT Weeks in Treatment: 105 Constitutional Well-nourished and well-hydrated in no acute distress. Respiratory normal breathing without difficulty. clear to auscultation bilaterally. Cardiovascular regular rate and rhythm with normal S1, S2. Psychiatric this patient is able to make decisions and demonstrates good insight into disease process. Alert and Oriented x 3. pleasant and cooperative. Notes Patient's wounds still appear to be somewhat hyper granular although there is no sniffed and Slough noted at this point. He also is not having any significant discomfort which is good news there is no purulent drainage. Electronic Signature(s) Signed: 09/25/2016 3:36:45 PM By: Worthy Keeler PA-C Entered By: Worthy Keeler on 09/25/2016 09:55:31 [name] ([id number]) -------------------------------------------------------------------------------- Physician Orders Details Patient Name: [name], [name] Date of Service: 09/25/2016 9:45 AM Medical Record Number: [id number] Patient Account Number: [id number] Date of Birth/Sex: [birth year] (71 y.o. Male) Treating RN: Carolyne Fiscal, Debi Primary Care Provider: Thomes Lolling Other Clinician: Referring Provider: Thomes Lolling Treating Provider/Extender: Sharalyn Ink in Treatment: 12 Verbal / Phone Orders: Yes ClinicianCarolyne Fiscal, Debi Read Back and Verified: Yes Diagnosis Coding ICD-10 Coding Code Description E11.622 Type 2 diabetes mellitus with other skin ulcer Unspecified open wound of abdominal wall, right upper quadrant without penetration into S31.100A peritoneal cavity, initial encounter Unspecified open wound of abdominal wall, epigastric region without penetration into S31.102A peritoneal cavity, initial encounter F17.228 Nicotine dependence, chewing tobacco, with other nicotine-induced disorders Wound Cleansing Wound #1 Right Abdomen  - midline o Clean wound with Normal Saline. o May Shower, gently pat wound dry prior to applying new dressing. Wound #2 Left Abdomen - midline o Clean wound with Normal Saline. o May Shower, gently pat wound dry prior to applying new dressing. Anesthetic Wound #1 Right Abdomen - midline o Topical Lidocaine 4% cream applied  to wound bed prior to debridement Wound #2 Left Abdomen - midline o Topical Lidocaine 4% cream applied to wound bed prior to debridement Skin Barriers/Peri-Wound Care Wound #1 Right Abdomen - midline o Skin Prep Wound #2 Left Abdomen - midline o Skin Prep Primary Wound Dressing Wound #1 Right Abdomen - midline o [address] RAEVON, BROOM ([id number]) Wound #2 Left Abdomen - midline o Hydrafera Blue Secondary Dressing Wound #1 Right Abdomen - midline o Other - [place] Wound #2 Left Abdomen - midline o Other - [place] Dressing Change Frequency Wound #1 Right Abdomen - midline o Change dressing every other day. Wound #2 Left Abdomen - midline o Change dressing every other day. Follow-up Appointments Wound #1 Right Abdomen - midline o Return Appointment in 2 weeks. Wound #2 Left Abdomen - midline o Return Appointment in 2 weeks. Additional Orders / Instructions Wound #1 Right Abdomen - midline o Other: - Go see a Engineer, drilling about your hernias and get a surgical opinion. Wound #2 Left Abdomen - midline o Other: - Go see a Engineer, drilling about your hernias and get a surgical opinion. Notes I'[name] going to recommend that we continue with the Current wound care measures for the next week. Please see above orders. If anything worsens in the interim patient will contact our office for additional recommendations. Otherwise we will see him for reevaluation in one week to see were things stand at that point. Electronic Signature(s) Signed: 09/25/2016 3:36:45 PM By: Worthy Keeler PA-C Entered By: Worthy Keeler on  09/25/2016 09:56:30 [name], [name] ([id number]) -------------------------------------------------------------------------------- Problem List Details Patient Name: [name], [name] Date of Service: 09/25/2016 9:45 AM Medical Record Number: [id number] Patient Account Number: [id number] Date of Birth/Sex: Aug 26, 1945 (71 y.o. Male) Treating RN: Ahmed Prima Primary Care Provider: Thomes Lolling Other Clinician: Referring Provider: Thomes Lolling Treating Provider/Extender: Sharalyn Ink in Treatment: 12 Active Problems ICD-10 Encounter Code Description Active Date Diagnosis E11.622 Type 2 diabetes mellitus with other skin ulcer 07/03/2016 Yes S31.100A Unspecified open wound of abdominal wall, right upper 07/03/2016 Yes quadrant without penetration into peritoneal cavity, initial encounter S31.102A Unspecified open wound of abdominal wall, epigastric 07/03/2016 Yes region without penetration into peritoneal cavity, initial encounter F17.228 Nicotine dependence, chewing tobacco, with other 07/03/2016 Yes nicotine-induced disorders Inactive Problems Resolved Problems Electronic Signature(s) Signed: 09/25/2016 3:36:45 PM By: Worthy Keeler PA-C Signed: 09/25/2016 3:50:12 PM By: Alric Quan Entered By: Alric Quan on 09/25/2016 10:36:40 [name] ([id number]) -------------------------------------------------------------------------------- Progress Note Details Patient Name: [name] Date of Service: 09/25/2016 9:45 AM Medical Record Number: [id number] Patient Account Number: [id number] Date of Birth/Sex: January 21, 1946 (71 y.o. Male) Treating RN: Carolyne Fiscal, Debi Primary Care Provider: Thomes Lolling Other Clinician: Referring Provider: Thomes Lolling Treating Provider/Extender: Sharalyn Ink in Treatment: 12 Subjective Chief Complaint Information obtained from Patient Patients presents for treatment of an open diabetic ulcer to the anterior abdominal  wall in the epigastric and right upper quadrant History of Present Illness (HPI) The following HPI elements were documented for the patient's wound: Location: abdomen wall wounds,-- right upper quadrant and epigastric region Quality: Patient reports experiencing a dull pain to affected area(s). Severity: Patient states wound are getting worse. Duration: Patient has had the wound for >5 year's prior to seeking treatment at the wound center Timing: Pain in wound is Intermittent (comes and goes Context: The wound appeared gradually over time Modifying Factors: Consults to this date include:not seen a Psychologist, sport and exercise and only sees his  PCP for his medical complaints Associated Signs and Symptoms: Patient reports having increase discharge. 71 year old gentleman has been seen recently by his PCP Dr. Thomes Lolling, who sees him with a history of diabetes mellitus, peripheral neuropathy, B12 deficiency, hypertension, osteoarthritis and the patient had recent blood sugar checked which was 163. Past medical history includes essential hypertension, incisional hernia, diabetes mellitus, colon polyps, COPD, tobacco abuse in the past, chewing tobacco, GERD. He has also been treated for chronic back pain with no sciatica and is on oxycodone. His last hemoglobin A1c was 6.9% His abdominal surgery was done at [facility] over 15 years ago and gradually there was sutures which protruded and then there was mesh which protruded. He washes this during his shower but does not put any dressing and the wound is covered with a lot of debris. He has never had a surgical consultation for this. 07/10/2016 -- the patient has spoken to his PCP who said he was going to call me but I have not yet had a phone conversation with the physician. From what I understand the physician was reluctant to have a surgical opinion because of the patient's several comorbidities and he feels that the surgery may be detrimental to his overall  health 07/24/2016 -- the patient's daughter is at the bedside and we've had a detailed discussion regarding his options for a surgical opinion and possible surgical intervention before this becomes a emergent situation. She will seek primary care input and take the father to [facility] for a surgical opinion. 07/31/2016 -- the patient's daughter has organized for a surgical opinion at [facility] on June 14 08/14/2016 -- it has been about 3 weeks since he has stopped chewing tobacco. [name], [name] ([id number]) 08/28/2016 -- he was seen at [facility] by Dr. Roby Lofts -- the assessment was based on physical examination and review of his previous history of surgical repair. the patient had undergone a ventral hernia repair in 1998 with Dr. Quay Burow at [facility] at a prior open cholecystectomy incision. he was evaluated by Dr. Denice Paradise in 2013 and at that time it was decided to watch and wait. Dr. Payton Doughty recommended a repeat CT scan and requested records from dura and regional operative records and would follow-up the patient, after Mr. Doo PCP Dr. Gwenlyn Saran reviewed him for overall fitness for surgery. 09/04/16 patient's wound actually appear to be doing well on evaluation today. He is not really experience any discomfort and the right abdominal wound is dry and almost appears to be healing up. He continues to have some drainage from the left but this seems to be more of a potential seroma which with milking is able to be cleared out. I discussed with patient that when he performs his dressing changes at home it would be beneficial for him to do this as well. Otherwise there's no evidence of infection. He is still waiting on his CT scan and appointments to determine whether he is going to have another surgery. 09/11/2016 -- the patient has had a CT scan and is awaiting his surgical appointment prior to deciding on any surgical intervention. 09/25/16 patient presents today for fault  evaluation concerning his ongoing abdominal surgical wounds. Unfortunately they do not appear to be improving significantly but rather fluctuate from a little better to little worse week by week. Obviously this is somewhat frustrating for him. Objective Constitutional Well-nourished and well-hydrated in no acute distress. Vitals Time Taken: 9:37 AM, Height: 65 in, Weight: 161 lbs, BMI:  26.8, Temperature: 98.0 F, Pulse: 90 bpm, Respiratory Rate: 18 breaths/min, Blood Pressure: 131/76 mmHg. Respiratory normal breathing without difficulty. clear to auscultation bilaterally. Cardiovascular regular rate and rhythm with normal S1, S2. Psychiatric this patient is able to make decisions and demonstrates good insight into disease process. Alert and Oriented x 3. pleasant and cooperative. [name], [name] ([id number]) General Notes: Patient's wounds still appear to be somewhat hyper granular although there is no sniffed and Slough noted at this point. He also is not having any significant discomfort which is good news there is no purulent drainage. Integumentary (Hair, Skin) Wound #1 status is Open. Original cause of wound was Gradually Appeared. The wound is located on the Right Abdomen - midline. The wound measures 0.2cm length x 0.4cm width x 0.1cm depth; 0.063cm^2 area and 0.006cm^3 volume. The wound is limited to skin breakdown. There is no tunneling or undermining noted. There is a medium amount of serous drainage noted. The wound margin is flat and intact. There is no granulation within the wound bed. There is a large (67-100%) amount of necrotic tissue within the wound bed including Eschar. The periwound skin appearance exhibited: Scarring. The periwound skin appearance did not exhibit: Callus, Crepitus, Excoriation, Induration, Rash, Dry/Scaly, Maceration, Atrophie Blanche, Cyanosis, Ecchymosis, Hemosiderin Staining, Mottled, Pallor, Rubor, Erythema. Periwound temperature was noted as No  Abnormality. Wound #2 status is Open. Original cause of wound was Gradually Appeared. The wound is located on the Left Abdomen - midline. The wound measures 4cm length x 0.4cm width x 0.1cm depth; 1.257cm^2 area and 0.126cm^3 volume. The wound is limited to skin breakdown. There is no tunneling or undermining noted. There is a large amount of purulent drainage noted. The wound margin is flat and intact. There is medium (34-66%) red granulation within the wound bed. There is a medium (34-66%) amount of necrotic tissue within the wound bed including Eschar and Adherent Slough. The periwound skin appearance exhibited: Induration, Scarring, Ecchymosis. The periwound skin appearance did not exhibit: Callus, Crepitus, Excoriation, Rash, Dry/Scaly, Maceration, Atrophie Blanche, Cyanosis, Hemosiderin Staining, Mottled, Pallor, Rubor, Erythema. Periwound temperature was noted as No Abnormality. Assessment Active Problems ICD-10 E11.622 - Type 2 diabetes mellitus with other skin ulcer S31.100A - Unspecified open wound of abdominal wall, right upper quadrant without penetration into peritoneal cavity, initial encounter S31.102A - Unspecified open wound of abdominal wall, epigastric region without penetration into peritoneal cavity, initial encounter F17.228 - Nicotine dependence, chewing tobacco, with other nicotine-induced disorders Plan Wound Cleansing: Wound #1 Right Abdomen - midline: Clean wound with Normal Saline. [name], [name] ([id number]) May Shower, gently pat wound dry prior to applying new dressing. Wound #2 Left Abdomen - midline: Clean wound with Normal Saline. May Shower, gently pat wound dry prior to applying new dressing. Anesthetic: Wound #1 Right Abdomen - midline: Topical Lidocaine 4% cream applied to wound bed prior to debridement Wound #2 Left Abdomen - midline: Topical Lidocaine 4% cream applied to wound bed prior to debridement Skin Barriers/Peri-Wound Care: Wound #1  Right Abdomen - midline: Skin Prep Wound #2 Left Abdomen - midline: Skin Prep Primary Wound Dressing: Wound #1 Right Abdomen - midline: Hydrafera Blue Wound #2 Left Abdomen - midline: Hydrafera Blue Secondary Dressing: Wound #1 Right Abdomen - midline: Other - [place] Wound #2 Left Abdomen - midline: Other - [place] Dressing Change Frequency: Wound #1 Right Abdomen - midline: Change dressing every other day. Wound #2 Left Abdomen - midline: Change dressing every other day. Follow-up Appointments: Wound #1 Right  Abdomen - midline: Return Appointment in 2 weeks. Wound #2 Left Abdomen - midline: Return Appointment in 2 weeks. Additional Orders / Instructions: Wound #1 Right Abdomen - midline: Other: - Go see a Engineer, drilling about your hernias and get a surgical opinion. Wound #2 Left Abdomen - midline: Other: - Go see a Engineer, drilling about your hernias and get a surgical opinion. General Notes: I'[name] going to recommend that we continue with the Current wound care measures for the next week. Please see above orders. If anything worsens in the interim patient will contact our office for additional recommendations. Otherwise we will see him for reevaluation in one week to see were things stand at that point. [name], [name] ([id number]) Electronic Signature(s) Signed: 09/25/2016 3:36:45 PM By: Worthy Keeler PA-C Entered By: Worthy Keeler on 09/25/2016 09:59:07 MACKY, GALIK ([id number]) -------------------------------------------------------------------------------- SuperBill Details Patient Name: [name] Date of Service: 09/25/2016 Medical Record Number: [id number] Patient Account Number: [id number] Date of Birth/Sex: [birth year] (71 y.o. Male) Treating RN: Carolyne Fiscal, Debi Primary Care Provider: Thomes Lolling Other Clinician: Referring Provider: Thomes Lolling Treating Provider/Extender: Sharalyn Ink in Treatment: 12 Diagnosis Coding ICD-10  Codes Code Description E11.622 Type 2 diabetes mellitus with other skin ulcer Unspecified open wound of abdominal wall, right upper quadrant without penetration into S31.100A peritoneal cavity, initial encounter Unspecified open wound of abdominal wall, epigastric region without penetration into S31.102A peritoneal cavity, initial encounter F17.228 Nicotine dependence, chewing tobacco, with other nicotine-induced disorders Facility Procedures CPT4 Code: [id number] Description: 99213 - WOUND CARE VISIT-LEV 3 EST PT Modifier: Quantity: 1 Physician Procedures CPT4: Description Modifier Quantity Code [id number] 99213 - WC PHYS LEVEL 3 - EST PT 1 ICD-10 Description Diagnosis E11.622 Type 2 diabetes mellitus with other skin ulcer S31.100A Unspecified open wound of abdominal wall, right upper quadrant without  penetration into peritoneal cavity, initial encounter S31.102A Unspecified open wound of abdominal wall, epigastric region without penetration into peritoneal cavity, initial encounter F17.228 Nicotine dependence, chewing tobacco, with other  nicotine-induced disorders Electronic Signature(s) Signed: 09/25/2016 3:36:45 PM By: Worthy Keeler PA-C Signed: 09/25/2016 3:50:12 PM By: Alric Quan Entered By: Alric Quan on 09/25/2016 10:36:31

## 2016-09-26 ENCOUNTER — Ambulatory Visit: Admission: RE | Admit: 2016-09-26 | Discharge: 2016-09-26 | Payer: MEDICARE

## 2016-09-26 DIAGNOSIS — E1142 Type 2 diabetes mellitus with diabetic polyneuropathy: Principal | ICD-10-CM

## 2016-09-26 DIAGNOSIS — T8579XS Infection and inflammatory reaction due to other internal prosthetic devices, implants and grafts, sequela: Secondary | ICD-10-CM

## 2016-09-26 DIAGNOSIS — M545 Low back pain: Secondary | ICD-10-CM

## 2016-09-26 DIAGNOSIS — G8929 Other chronic pain: Secondary | ICD-10-CM

## 2016-09-26 DIAGNOSIS — R490 Dysphonia: Secondary | ICD-10-CM

## 2016-09-26 DIAGNOSIS — M5090 Cervical disc disorder, unspecified, unspecified cervical region: Secondary | ICD-10-CM

## 2016-09-26 MED ORDER — OXYCODONE-ACETAMINOPHEN 5 MG-325 MG TABLET
ORAL_TABLET | Freq: Four times a day (QID) | ORAL | 0 refills | 0 days | Status: CP | PRN
Start: 2016-09-26 — End: 2016-10-26

## 2016-09-26 MED ORDER — LISINOPRIL 10 MG TABLET
ORAL_TABLET | 3 refills | 0 days | Status: CP
Start: 2016-09-26 — End: 2017-09-17

## 2016-10-02 ENCOUNTER — Encounter: Payer: Medicare Other | Admitting: Surgery

## 2016-10-02 DIAGNOSIS — E11622 Type 2 diabetes mellitus with other skin ulcer: Secondary | ICD-10-CM | POA: Diagnosis not present

## 2016-10-03 NOTE — Progress Notes (Signed)
[name], [name] ([id number]) Visit Report for 10/02/2016 Arrival Information Details Patient Name: [name], [name]. Date of Service: 10/02/2016 9:45 AM Medical Record Number: [id number] Patient Account Number: [id number] Date of Birth/Sex: 1945/10/31 (71 y.o. Male) Treating RN: [name] Primary Care [name]: [name] Other Clinician: Referring [name]: [name] Treating [name]/Extender: [name] in Treatment: 13 Visit Information History Since Last Visit All ordered tests and consults were completed: No Patient Arrived: Ambulatory Added or deleted any medications: No Arrival Time: 09:38 Any new allergies or adverse reactions: No Accompanied By: self Had a fall or experienced change in No Transfer Assistance: None activities of daily living that may affect Patient Identification Verified: Yes risk of falls: Secondary Verification Process Yes Signs or symptoms of abuse/neglect since last No Completed: visito Patient Requires Transmission-Based No Hospitalized since last visit: No Precautions: Has Dressing in Place as Prescribed: Yes Patient Has Alerts: Yes Pain Present Now: No Patient Alerts: DMII Electronic Signature(s) Signed: 10/02/2016 4:27:45 PM By: [name] Entered By: [name] on 10/02/2016 09:40:11 [name] ([id number]) -------------------------------------------------------------------------------- Clinic Level of Care Assessment Details Patient Name: [name] Date of Service: 10/02/2016 9:45 AM Medical Record Number: [id number] Patient Account Number: [id number] Date of Birth/Sex: 1945-07-28 (71 y.o. Male) Treating RN: [name], [name] Primary Care Kanin Lia: [name] Other Clinician: Referring [name]: [name] Treating [name]/Extender: [name] in Treatment: 13 Clinic Level of Care Assessment Items TOOL 4 Quantity Score X - Use when only an EandM is performed on FOLLOW-UP visit 1  0 ASSESSMENTS - Nursing Assessment / Reassessment X - Reassessment of Co-morbidities (includes updates in patient status) 1 10 X - Reassessment of Adherence to Treatment Plan 1 5 ASSESSMENTS - Wound and Skin Assessment / Reassessment []  - Simple Wound Assessment / Reassessment - one wound 0 X - Complex Wound Assessment / Reassessment - multiple wounds 2 5 []  - Dermatologic / Skin Assessment (not related to wound area) 0 ASSESSMENTS - Focused Assessment []  - Circumferential Edema Measurements - multi extremities 0 []  - Nutritional Assessment / Counseling / Intervention 0 []  - Lower Extremity Assessment (monofilament, tuning fork, pulses) 0 []  - Peripheral Arterial Disease Assessment (using hand held doppler) 0 ASSESSMENTS - Ostomy and/or Continence Assessment and Care []  - Incontinence Assessment and Management 0 []  - Ostomy Care Assessment and Management (repouching, etc.) 0 PROCESS - Coordination of Care X - Simple Patient / Family Education for ongoing care 1 15 []  - Complex (extensive) Patient / Family Education for ongoing care 0 []  - Staff obtains Programmer, systems, Records, Test Results / Process Orders 0 []  - Staff telephones HHA, Nursing Homes / Clarify orders / etc 0 []  - Routine Transfer to another Facility (non-emergent condition) 0 [name] ([id number]) []  - Routine Hospital Admission (non-emergent condition) 0 []  - New Admissions / Biomedical engineer / Ordering NPWT, Apligraf, etc. 0 []  - Emergency Hospital Admission (emergent condition) 0 X - Simple Discharge Coordination 1 10 []  - Complex (extensive) Discharge Coordination 0 PROCESS - Special Needs []  - Pediatric / Minor Patient Management 0 []  - Isolation Patient Management 0 []  - Hearing / Language / Visual special needs 0 []  - Assessment of Community assistance (transportation, D/C planning, etc.) 0 []  - Additional assistance / Altered mentation 0 []  - Support Surface(s) Assessment (bed, cushion, seat, etc.)  0 INTERVENTIONS - Wound Cleansing / Measurement []  - Simple Wound Cleansing - one wound 0 X - Complex Wound Cleansing - multiple wounds 2 5 X - Wound  Imaging (photographs - any number of wounds) 1 5 []  - Wound Tracing (instead of photographs) 0 []  - Simple Wound Measurement - one wound 0 X - Complex Wound Measurement - multiple wounds 2 5 INTERVENTIONS - Wound Dressings X - Small Wound Dressing one or multiple wounds 2 10 []  - Medium Wound Dressing one or multiple wounds 0 []  - Large Wound Dressing one or multiple wounds 0 X - Application of Medications - topical 1 5 []  - Application of Medications - injection 0 INTERVENTIONS - Miscellaneous []  - External ear exam 0 [name]. ([id number]) []  - Specimen Collection (cultures, biopsies, blood, body fluids, etc.) 0 []  - Specimen(s) / Culture(s) sent or taken to Lab for analysis 0 []  - Patient Transfer (multiple staff / [name] / Similar devices) 0 []  - Simple Staple / Suture removal (25 or less) 0 []  - Complex Staple / Suture removal (26 or more) 0 []  - Hypo / Hyperglycemic Management (close monitor of Blood Glucose) 0 []  - Ankle / Brachial Index (ABI) - do not check if billed separately 0 X - Vital Signs 1 5 Has the patient been seen at the hospital within the last three years: Yes Total Score: 105 Level Of Care: New/Established - Level 3 Electronic Signature(s) Signed: 10/02/2016 4:27:45 PM By: [name] Entered By: [name] on 10/02/2016 11:48:14 [name] ([id number]) -------------------------------------------------------------------------------- Encounter Discharge Information Details Patient Name: [name] Date of Service: 10/02/2016 9:45 AM Medical Record Number: [id number] Patient Account Number: [id number] Date of Birth/Sex: 18-Apr-1945 (71 y.o. Male) Treating RN: [name] Primary Care Ayden Apodaca: [name] Other Clinician: Referring Onetta Spainhower: [name] Treating  [name]/Extender: [name] in Treatment: 63 Encounter Discharge Information Items Discharge Pain Level: 0 Discharge Condition: Stable Ambulatory Status: Ambulatory Discharge Destination: Home Transportation: Private Auto Accompanied By: self Schedule Follow-up Appointment: Yes Medication Reconciliation completed No and provided to Patient/Care Kalia Vahey: Patient Clinical Summary of Care: Declined Electronic Signature(s) Signed: 10/02/2016 4:27:45 PM By: [name] Previous Signature: 10/02/2016 10:20:03 AM Version By: [name] Entered By: [name] on 10/02/2016 11:49:15 [name] ([id number]) -------------------------------------------------------------------------------- Lower Extremity Assessment Details Patient Name: [name] Date of Service: 10/02/2016 9:45 AM Medical Record Number: [id number] Patient Account Number: [id number] Date of Birth/Sex: [birth year] (71 y.o. Male) Treating RN: [name] Primary Care Hyder Deman: [name] Other Clinician: Referring Marine Lezotte: [name] Treating Lurie Mullane/Extender: [name] in Treatment: 13 Electronic Signature(s) Signed: 10/02/2016 4:27:45 PM By: [name] Entered By: [name] on 10/02/2016 09:46:22 [name] ([id number]) -------------------------------------------------------------------------------- Multi Wound Chart Details Patient Name: [name] Date of Service: 10/02/2016 9:45 AM Medical Record Number: [id number] Patient Account Number: [id number] Date of Birth/Sex: 1946/01/22 (71 y.o. Male) Treating RN: [name] Primary Care Tylesha Gibeault: [name] Other Clinician: Referring Tove Wideman: [name] Treating Kattia Selley/Extender: [name] in Treatment: 13 Vital Signs Height(in): 65 Pulse(bpm): 74 Weight(lbs): 161 Blood Pressure 146/80 (mmHg): Body Mass Index(BMI): 27 Temperature(F): 97.8 Respiratory  Rate 18 (breaths/min): Photos: [1:No Photos] [2:No Photos] [N/A:N/A] Wound Location: [1:Right Abdomen - midline] [2:Left Abdomen - midline] [N/A:N/A] Wounding Event: [1:Gradually Appeared] [2:Gradually Appeared] [N/A:N/A] Primary Etiology: [1:Atypical] [2:Atypical] [N/A:N/A] Comorbid History: [1:Cataracts, Chronic Obstructive Pulmonary Disease (COPD), Hypertension, Type II Diabetes, Osteoarthritis, Neuropathy] [2:Cataracts, Chronic Obstructive Pulmonary Disease (COPD), Hypertension, Type II Diabetes, Osteoarthritis,  Neuropathy] [N/A:N/A] Date Acquired: [1:05/17/2015] [2:05/17/2015] [N/A:N/A] Weeks of Treatment: [1:13] [2:13] [N/A:N/A] Wound Status: [1:Open] [2:Open] [N/A:N/A] Clustered Wound: [1:No] [2:Yes] [N/A:N/A] Clustered Quantity: [1:N/A] [2:3] [N/A:N/A] Measurements L x  W x D 0.2x0.4x0.1 [2:6x0.4x0.1] [N/A:N/A] (cm) Area (cm) : [1:0.063] [2:1.885] [N/A:N/A] Volume (cm) : [1:0.006] [2:0.188] [N/A:N/A] % Reduction in Area: [1:98.20%] [2:64.30%] [N/A:N/A] % Reduction in Volume: 98.30% [2:64.40%] [N/A:N/A] Classification: [1:Full Thickness Without Exposed Support Structures] [2:Full Thickness Without Exposed Support Structures] [N/A:N/A] Exudate Amount: [1:Medium] [2:Large] [N/A:N/A] Exudate Type: [1:Serosanguineous] [2:Purulent] [N/A:N/A] Exudate Color: [1:red, [place]] [2:yellow, [place], green] [N/A:N/A] Wound Margin: [1:Flat and Intact] [2:Flat and Intact] [N/A:N/A] Granulation Amount: [1:Large (67-100%)] [2:Medium (34-66%)] [N/A:N/A] Granulation Quality: [1:Red] [2:Red] [N/A:N/A] Necrotic Amount: None Present (0%) Medium (34-66%) N/A Necrotic Tissue: N/A Eschar, Adherent Slough N/A Exposed Structures: Fascia: No Fascia: No N/A Fat Layer (Subcutaneous Fat Layer (Subcutaneous Tissue) Exposed: No Tissue) Exposed: No Tendon: No Tendon: No Muscle: No Muscle: No Joint: No Joint: No Bone: No Bone: No Limited to Skin Limited to Skin Breakdown Breakdown Epithelialization:  Medium (34-66%) Small (1-33%) N/A Periwound Skin Texture: Scarring: Yes Induration: Yes N/A Excoriation: No Scarring: Yes Induration: No Excoriation: No Callus: No Callus: No Crepitus: No Crepitus: No Rash: No Rash: No Periwound Skin Maceration: No Maceration: No N/A Moisture: Dry/Scaly: No Dry/Scaly: No Periwound Skin Color: Atrophie Blanche: No Ecchymosis: Yes N/A Cyanosis: No Atrophie Blanche: No Ecchymosis: No Cyanosis: No Erythema: No Erythema: No Hemosiderin Staining: No Hemosiderin Staining: No Mottled: No Mottled: No Pallor: No Pallor: No Rubor: No Rubor: No Temperature: No Abnormality No Abnormality N/A Tenderness on No No N/A Palpation: Wound Preparation: Ulcer Cleansing: Ulcer Cleansing: N/A Rinsed/Irrigated with Rinsed/Irrigated with Saline Saline Topical Anesthetic Topical Anesthetic Applied: Other: lidocaine Applied: Other: lidocaine 4% 4% Treatment Notes Electronic Signature(s) Signed: 10/02/2016 10:20:03 AM By: Christin Fudge MD, FACS Entered By: Christin Fudge on 10/02/2016 10:20:03 [name] ([id number]) -------------------------------------------------------------------------------- [place] Details Patient Name: [name], [name] Date of Service: 10/02/2016 9:45 AM Medical Record Number: [id number] Patient Account Number: [id number] Date of Birth/Sex: 07/14/1945 (71 y.o. Male) Treating RN: [name], [name] Primary Care Kaelin Holford: [name] Other Clinician: Referring Ranisha Allaire: [name] Treating Kenith Trickel/Extender: [name] in Treatment: 13 Active Inactive ` Abuse / Safety / Falls / Self Care Management Nursing Diagnoses: Impaired physical mobility Goals: Patient will remain injury free Date Initiated: 07/03/2016 Target Resolution Date: 09/14/2016 Goal Status: Active Interventions: Assess fall risk on admission and as needed Notes: ` Orientation to the Wound Care Program Nursing  Diagnoses: Knowledge deficit related to the wound healing center program Goals: Patient/caregiver will verbalize understanding of the [facility] Program Date Initiated: 07/03/2016 Target Resolution Date: 09/15/2016 Goal Status: Active Interventions: Provide education on orientation to the wound center Notes: ` Wound/Skin Impairment Nursing Diagnoses: Impaired tissue integrity [name], [name] ([id number]) Goals: Patient/caregiver will verbalize understanding of skin care regimen Date Initiated: 07/03/2016 Target Resolution Date: 09/15/2016 Goal Status: Active Ulcer/skin breakdown will have a volume reduction of 30% by week 4 Date Initiated: 07/03/2016 Target Resolution Date: 09/15/2016 Goal Status: Active Ulcer/skin breakdown will have a volume reduction of 50% by week 8 Date Initiated: 07/03/2016 Target Resolution Date: 09/15/2016 Goal Status: Active Ulcer/skin breakdown will have a volume reduction of 80% by week 12 Date Initiated: 07/03/2016 Target Resolution Date: 09/15/2016 Goal Status: Active Ulcer/skin breakdown will heal within 14 weeks Date Initiated: 07/03/2016 Target Resolution Date: 09/15/2016 Goal Status: Active Interventions: Assess patient/caregiver ability to obtain necessary supplies Assess patient/caregiver ability to perform ulcer/skin care regimen upon admission and as needed Assess ulceration(s) every visit Notes: Electronic Signature(s) Signed: 10/02/2016 4:27:45 PM By: [name] Entered By: [name] on 10/02/2016 10:02:20 [name] ([id number]) --------------------------------------------------------------------------------  Pain Assessment Details Patient Name: [name], [name]. Date of Service: 10/02/2016 9:45 AM Medical Record Number: [id number] Patient Account Number: [id number] Date of Birth/Sex: [birth year] (71 y.o. Male) Treating RN: [name] Primary Care Chatham Howington: [name] Other Clinician: Referring Skyleigh Windle: [name] Treating Geofrey Silliman/Extender: [name] in Treatment: 13 Active Problems Location of Pain Severity and Description of Pain Patient Has Paino No Site Locations With Dressing Change: No Pain Management and Medication Current Pain Management: Electronic Signature(s) Signed: 10/02/2016 4:27:45 PM By: [name] Entered By: [name] on 10/02/2016 09:40:18 [name] ([id number]) -------------------------------------------------------------------------------- Patient/Caregiver Education Details Patient Name: [name] Date of Service: 10/02/2016 9:45 AM Medical Record Number: [id number] Patient Account Number: [id number] Date of Birth/Gender: 09/12/1945 (71 y.o. Male) Treating RN: [name] Primary Care Physician: [name] Other Clinician: Referring Physician: Thomes Jackson Treating Physician/Extender: [name] in Treatment: 13 Education Assessment Education Provided To: Patient Education Topics Provided Wound/Skin Impairment: Handouts: Other: change dressing as ordered Methods: Demonstration, Explain/Verbal Responses: State content correctly Electronic Signature(s) Signed: 10/02/2016 4:27:45 PM By: [name] Entered By: [name] on 10/02/2016 11:49:25 [name] ([id number]) -------------------------------------------------------------------------------- Wound Assessment Details Patient Name: [name] Date of Service: 10/02/2016 9:45 AM Medical Record Number: [id number] Patient Account Number: [id number] Date of Birth/Sex: [birth year] (71 y.o. Male) Treating RN: [name], [name] Primary Care Avrey Flanagin: [name] Other Clinician: Referring Novali Vollman: [name] Treating Edison Wollschlager/Extender: [name] in Treatment: 13 Wound Status Wound Number: 1 Primary Atypical Etiology: Wound Location: Right Abdomen - midline Wound Open Wounding Event: Gradually Appeared Status: Date  Acquired: 05/17/2015 Comorbid Cataracts, Chronic Obstructive Weeks Of Treatment: 13 History: Pulmonary Disease (COPD), Clustered Wound: No Hypertension, Type II Diabetes, Osteoarthritis, Neuropathy Photos Photo Uploaded By: [name] on 10/02/2016 11:38:48 Wound Measurements Length: (cm) 0.2 Width: (cm) 0.4 Depth: (cm) 0.1 Area: (cm) 0.063 Volume: (cm) 0.006 % Reduction in Area: 98.2% % Reduction in Volume: 98.3% Epithelialization: Medium (34-66%) Tunneling: No Undermining: No Wound Description Full Thickness Without Exposed Classification: Support Structures Wound Margin: Flat and Intact Exudate Medium Amount: Exudate Type: Serosanguineous Exudate Color: red, [place] Foul Odor After Cleansing: No Slough/Fibrino No Wound Bed Granulation Amount: Large (67-100%) Exposed Structure [name], [name] ([id number]) Granulation Quality: Red Fascia Exposed: No Necrotic Amount: None Present (0%) Fat Layer (Subcutaneous Tissue) Exposed: No Tendon Exposed: No Muscle Exposed: No Joint Exposed: No Bone Exposed: No Limited to Skin Breakdown Periwound Skin Texture Texture Color No Abnormalities Noted: No No Abnormalities Noted: No Callus: No Atrophie Blanche: No Crepitus: No Cyanosis: No Excoriation: No Ecchymosis: No Induration: No Erythema: No Rash: No Hemosiderin Staining: No Scarring: Yes Mottled: No Pallor: No Moisture Rubor: No No Abnormalities Noted: No Dry / Scaly: No Temperature / Pain Maceration: No Temperature: No Abnormality Wound Preparation Ulcer Cleansing: Rinsed/Irrigated with Saline Topical Anesthetic Applied: Other: lidocaine 4%, Treatment Notes Wound #1 (Right Abdomen - midline) 1. Cleansed with: Clean wound with Normal Saline 2. Anesthetic Topical Lidocaine 4% cream to wound bed prior to debridement 3. Peri-wound Care: Skin Prep 4. Dressing Applied: Aquacel Ag 5. Secondary [place]  Signature(s) Signed: 10/02/2016 4:27:45 PM By: [name] Entered By: [name] on 10/02/2016 09:45:09 [name] ([id number]) -------------------------------------------------------------------------------- Wound Assessment Details Patient Name: [name], [name] Date of Service: 10/02/2016 9:45 AM Medical Record Number: [id number] Patient Account Number: [id number] Date of Birth/Sex: 1945-09-03 (71 y.o. Male) Treating RN: [name] Primary Care Castiel Lauricella: [name] Other Clinician: Referring Michaeljoseph Revolorio: [name] Treating Kerrilynn Derenzo/Extender:  Britto, Errol Weeks in Treatment: 13 Wound Status Wound Number: 2 Primary Atypical Etiology: Wound Location: Left Abdomen - midline Wound Open Wounding Event: Gradually Appeared Status: Date Acquired: 05/17/2015 Comorbid Cataracts, Chronic Obstructive Weeks Of Treatment: 13 History: Pulmonary Disease (COPD), Clustered Wound: Yes Hypertension, Type II Diabetes, Osteoarthritis, Neuropathy Photos Photo Uploaded By: [name] on 10/02/2016 11:38:48 Wound Measurements Length: (cm) 6 Width: (cm) 0.4 Depth: (cm) 0.1 Clustered Quantity: 3 Area: (cm) 1.885 Volume: (cm) 0.188 % Reduction in Area: 64.3% % Reduction in Volume: 64.4% Epithelialization: Small (1-33%) Wound Description Full Thickness Without Exposed Foul Odor Afte Classification: Support Structures Slough/Fibrino Wound Margin: Flat and Intact Exudate Large Amount: Exudate Type: Purulent Exudate Color: yellow, [place], green r Cleansing: No No Wound Bed BADEN, BETSCH ([id number]) Granulation Amount: Medium (34-66%) Exposed Structure Granulation Quality: Red Fascia Exposed: No Necrotic Amount: Medium (34-66%) Fat Layer (Subcutaneous Tissue) Exposed: No Necrotic Quality: Eschar, Adherent Slough Tendon Exposed: No Muscle Exposed: No Joint Exposed: No Bone Exposed: No Limited to Skin Breakdown Periwound Skin Texture Texture Color No  Abnormalities Noted: No No Abnormalities Noted: No Callus: No Atrophie Blanche: No Crepitus: No Cyanosis: No Excoriation: No Ecchymosis: Yes Induration: Yes Erythema: No Rash: No Hemosiderin Staining: No Scarring: Yes Mottled: No Pallor: No Moisture Rubor: No No Abnormalities Noted: No Dry / Scaly: No Temperature / Pain Maceration: No Temperature: No Abnormality Wound Preparation Ulcer Cleansing: Rinsed/Irrigated with Saline Topical Anesthetic Applied: Other: lidocaine 4%, Treatment Notes Wound #2 (Left Abdomen - midline) 1. Cleansed with: Clean wound with Normal Saline 2. Anesthetic Topical Lidocaine 4% cream to wound bed prior to debridement 3. Peri-wound Care: Skin Prep 4. Dressing Applied: Aquacel Ag 5. Secondary [place] Signature(s) Signed: 10/02/2016 4:27:45 PM By: [name] Entered By: [name] on 10/02/2016 09:45:45 [name] ([id number]) -------------------------------------------------------------------------------- [place] Details Patient Name: [name], [name] Date of Service: 10/02/2016 9:45 AM Medical Record Number: [id number] Patient Account Number: [id number] Date of Birth/Sex: [birth year] (71 y.o. Male) Treating RN: [name], [name] Primary Care Shanece Cochrane: [name] Other Clinician: Referring Kamirah Shugrue: [name] Treating Keigo Whalley/Extender: [name] in Treatment: 13 Vital Signs Time Taken: 09:40 Temperature (F): 97.8 Height (in): 65 Pulse (bpm): 74 Weight (lbs): 161 Respiratory Rate (breaths/min): 18 Body Mass Index (BMI): 26.8 Blood Pressure (mmHg): 146/80 Reference Range: 80 - 120 mg / dl Electronic Signature(s) Signed: 10/02/2016 4:27:45 PM By: [name] Entered By: [name] on 10/02/2016 09:40:33

## 2016-10-03 NOTE — Progress Notes (Signed)
Michael, [name] ([id number]) Visit Report for 10/02/2016 Chief Complaint Document Details Patient Name: [name], [name]. Date of Service: 10/02/2016 9:45 AM Medical Record Number: [id number] Patient Account Number: [id number] Date of Birth/Sex: 01/23/1946 (71 y.o. Male) Treating RN: Ahmed Prima Primary Care Provider: Thomes Lolling Other Clinician: Referring Provider: Thomes Lolling Treating Provider/Extender: Frann Rider in Treatment: 13 Information Obtained from: Patient Chief Complaint Patients presents for treatment of an open diabetic ulcer to the anterior abdominal wall in the epigastric and right upper quadrant Electronic Signature(s) Signed: 10/02/2016 10:20:10 AM By: Christin Fudge MD, FACS Entered By: Christin Fudge on 10/02/2016 10:20:10 [name] ([id number]) -------------------------------------------------------------------------------- HPI Details Patient Name: [name] Date of Service: 10/02/2016 9:45 AM Medical Record Number: [id number] Patient Account Number: [id number] Date of Birth/Sex: 01-18-1946 (71 y.o. Male) Treating RN: Ahmed Prima Primary Care Provider: Thomes Lolling Other Clinician: Referring Provider: Thomes Lolling Treating Provider/Extender: Frann Rider in Treatment: 13 History of Present Illness Location: abdomen wall wounds,-- right upper quadrant and epigastric region Quality: Patient reports experiencing a dull pain to affected area(s). Severity: Patient states wound are getting worse. Duration: Patient has had the wound for >5 year's prior to seeking treatment at the wound center Timing: Pain in wound is Intermittent (comes and goes Context: The wound appeared gradually over time Modifying Factors: Consults to this date include:not seen a surgeon and only sees his PCP for his medical complaints Associated Signs and Symptoms: Patient reports having increase discharge. HPI Description: 71 year old gentleman has been  seen recently by his PCP Dr. Thomes Lolling, who sees him with a history of diabetes mellitus, peripheral neuropathy, B12 deficiency, hypertension, osteoarthritis and the patient had recent blood sugar checked which was 163. Past medical history includes essential hypertension, incisional hernia, diabetes mellitus, colon polyps, COPD, tobacco abuse in the past, chewing tobacco, GERD. He has also been treated for chronic back pain with no sciatica and is on oxycodone. His last hemoglobin A1c was 6.9% His abdominal surgery was done at [facility] over 15 years ago and gradually there was sutures which protruded and then there was mesh which protruded. He washes this during his shower but does not put any dressing and the wound is covered with a lot of debris. He has never had a surgical consultation for this. 07/10/2016 -- the patient has spoken to his PCP who said he was going to call me but I have not yet had a phone conversation with the physician. From what I understand the physician was reluctant to have a surgical opinion because of the patient's several comorbidities and he feels that the surgery may be detrimental to his overall health 07/24/2016 -- the patient's daughter is at the bedside and we've had a detailed discussion regarding his options for a surgical opinion and possible surgical intervention before this becomes a emergent situation. She will seek primary care input and take the father to [facility] for a surgical opinion. 07/31/2016 -- the patient's daughter has organized for a surgical opinion at [facility] on June 14 08/14/2016 -- it has been about 3 weeks since he has stopped chewing tobacco. 08/28/2016 -- he was seen at [facility] by Dr. Roby Lofts -- the assessment was based on physical examination and review of his previous history of surgical repair. the patient had undergone a ventral hernia repair in 1998 with Dr. Quay Burow at [facility] at a prior open  cholecystectomy incision. he was evaluated by Dr. Denice Paradise in 2013 and at that  time it was decided to watch and wait. Dr. Payton Doughty recommended a repeat CT scan and requested records from dura and regional operative records and would follow-up the patient, after Mr. Roop PCP Dr. Gwenlyn Saran reviewed him for overall fitness for surgery. 09/04/16 patient's wound actually appear to be doing well on evaluation today. He is not really experience Michael, Jackson. ([id number]) any discomfort and the right abdominal wound is dry and almost appears to be healing up. He continues to have some drainage from the left but this seems to be more of a potential seroma which with milking is able to be cleared out. I discussed with patient that when he performs his dressing changes at home it would be beneficial for him to do this as well. Otherwise there's no evidence of infection. He is still waiting on his CT scan and appointments to determine whether he is going to have another surgery. 09/11/2016 -- the patient has had a CT scan and is awaiting his surgical appointment prior to deciding on any surgical intervention. 09/25/16 patient presents today for fault evaluation concerning his ongoing abdominal surgical wounds. Unfortunately they do not appear to be improving significantly but rather fluctuate from a little better to little worse week by week. Obviously this is somewhat frustrating for him. 10/02/2016 -- the patient has completed his CT scan, and has a review by the surgeons at the end of August. He has a new area which has opened up a little below his left abdominal wound. Electronic Signature(s) Signed: 10/02/2016 10:21:16 AM By: Christin Fudge MD, FACS Entered By: Christin Fudge on 10/02/2016 10:21:16 [name] ([id number]) -------------------------------------------------------------------------------- Physician Orders Details Patient Name: [name], [name] Date of Service: 10/02/2016 9:45 AM Medical  Record Number: [id number] Patient Account Number: [id number] Date of Birth/Sex: 10/23/1945 (71 y.o. Male) Treating RN: Ahmed Prima Primary Care Provider: Thomes Lolling Other Clinician: Referring Provider: Thomes Lolling Treating Provider/Extender: Frann Rider in Treatment: 60 Verbal / Phone Orders: Yes Clinician: Carolyne Fiscal, Debi Read Back and Verified: Yes Diagnosis Coding Wound Cleansing Wound #1 Right Abdomen - midline o Clean wound with Normal Saline. o May Shower, gently pat wound dry prior to applying new dressing. Wound #2 Left Abdomen - midline o Clean wound with Normal Saline. o May Shower, gently pat wound dry prior to applying new dressing. Anesthetic Wound #1 Right Abdomen - midline o Topical Lidocaine 4% cream applied to wound bed prior to debridement Wound #2 Left Abdomen - midline o Topical Lidocaine 4% cream applied to wound bed prior to debridement Skin Barriers/Peri-Wound Care Wound #1 Right Abdomen - midline o Skin Prep Wound #2 Left Abdomen - midline o Skin Prep Primary Wound Dressing Wound #1 Right Abdomen - midline o Aquacel Ag Wound #2 Left Abdomen - midline o Aquacel Ag Secondary Dressing Wound #1 Right Abdomen - midline o Other - [place] Wound #2 Left Abdomen - midline o Other - telfa [address] [place] ([id number]) Dressing Change Frequency Wound #1 Right Abdomen - midline o Change dressing every other day. Wound #2 Left Abdomen - midline o Change dressing every other day. Follow-up Appointments Wound #1 Right Abdomen - midline o Return Appointment in 2 weeks. Wound #2 Left Abdomen - midline o Return Appointment in 2 weeks. Additional Orders / Instructions Wound #1 Right Abdomen - midline o Other: - Go see a Engineer, drilling about your hernias and get a surgical opinion. Wound #2 Left Abdomen - midline o Other: - Go see a Engineer, drilling about  your hernias and get a surgical  opinion. Electronic Signature(s) Signed: 10/02/2016 4:15:42 PM By: Christin Fudge MD, FACS Signed: 10/02/2016 4:27:45 PM By: Alric Quan Entered By: Alric Quan on 10/02/2016 10:05:33 [name] ([id number]) -------------------------------------------------------------------------------- Problem List Details Patient Name: JAVONI, LUCKEN Date of Service: 10/02/2016 9:45 AM Medical Record Number: [id number] Patient Account Number: [id number] Date of Birth/Sex: [birth year] (71 y.o. Male) Treating RN: Ahmed Prima Primary Care Provider: Thomes Lolling Other Clinician: Referring Provider: Thomes Lolling Treating Provider/Extender: Frann Rider in Treatment: 13 Active Problems ICD-10 Encounter Code Description Active Date Diagnosis E11.622 Type 2 diabetes mellitus with other skin ulcer 07/03/2016 Yes S31.100A Unspecified open wound of abdominal wall, right upper 07/03/2016 Yes quadrant without penetration into peritoneal cavity, initial encounter S31.102A Unspecified open wound of abdominal wall, epigastric 07/03/2016 Yes region without penetration into peritoneal cavity, initial encounter F17.228 Nicotine dependence, chewing tobacco, with other 07/03/2016 Yes nicotine-induced disorders Inactive Problems Resolved Problems Electronic Signature(s) Signed: 10/02/2016 10:19:58 AM By: Christin Fudge MD, FACS Entered By: Christin Fudge on 10/02/2016 10:19:58 [name] ([id number]) -------------------------------------------------------------------------------- Progress Note Details Patient Name: [name] Date of Service: 10/02/2016 9:45 AM Medical Record Number: [id number] Patient Account Number: [id number] Date of Birth/Sex: 12-Aug-1945 (71 y.o. Male) Treating RN: Carolyne Fiscal, Debi Primary Care Provider: Thomes Lolling Other Clinician: Referring Provider: Thomes Lolling Treating Provider/Extender: Frann Rider in Treatment: 13 Subjective Chief  Complaint Information obtained from Patient Patients presents for treatment of an open diabetic ulcer to the anterior abdominal wall in the epigastric and right upper quadrant History of Present Illness (HPI) The following HPI elements were documented for the patient's wound: Location: abdomen wall wounds,-- right upper quadrant and epigastric region Quality: Patient reports experiencing a dull pain to affected area(s). Severity: Patient states wound are getting worse. Duration: Patient has had the wound for >5 year's prior to seeking treatment at the wound center Timing: Pain in wound is Intermittent (comes and goes Context: The wound appeared gradually over time Modifying Factors: Consults to this date include:not seen a surgeon and only sees his PCP for his medical complaints Associated Signs and Symptoms: Patient reports having increase discharge. 71 year old gentleman has been seen recently by his PCP Dr. Thomes Lolling, who sees him with a history of diabetes mellitus, peripheral neuropathy, B12 deficiency, hypertension, osteoarthritis and the patient had recent blood sugar checked which was 163. Past medical history includes essential hypertension, incisional hernia, diabetes mellitus, colon polyps, COPD, tobacco abuse in the past, chewing tobacco, GERD. He has also been treated for chronic back pain with no sciatica and is on oxycodone. His last hemoglobin A1c was 6.9% His abdominal surgery was done at [facility] over 15 years ago and gradually there was sutures which protruded and then there was mesh which protruded. He washes this during his shower but does not put any dressing and the wound is covered with a lot of debris. He has never had a surgical consultation for this. 07/10/2016 -- the patient has spoken to his PCP who said he was going to call me but I have not yet had a phone conversation with the physician. From what I understand the physician was reluctant to have  a surgical opinion because of the patient's several comorbidities and he feels that the surgery may be detrimental to his overall health 07/24/2016 -- the patient's daughter is at the bedside and we've had a detailed discussion regarding his options for a surgical opinion and possible surgical intervention before this becomes a emergent  situation. She will seek primary care input and take the father to [facility] for a surgical opinion. 07/31/2016 -- the patient's daughter has organized for a surgical opinion at [facility] on June 14 08/14/2016 -- it has been about 3 weeks since he has stopped chewing tobacco. [name], [name] ([id number]) 08/28/2016 -- he was seen at [facility] by Dr. Roby Lofts -- the assessment was based on physical examination and review of his previous history of surgical repair. the patient had undergone a ventral hernia repair in 1998 with Dr. Quay Burow at [facility] at a prior open cholecystectomy incision. he was evaluated by Dr. Denice Paradise in 2013 and at that time it was decided to watch and wait. Dr. Payton Doughty recommended a repeat CT scan and requested records from dura and regional operative records and would follow-up the patient, after Mr. [place] PCP Dr. Gwenlyn Saran reviewed him for overall fitness for surgery. 09/04/16 patient's wound actually appear to be doing well on evaluation today. He is not really experience any discomfort and the right abdominal wound is dry and almost appears to be healing up. He continues to have some drainage from the left but this seems to be more of a potential seroma which with milking is able to be cleared out. I discussed with patient that when he performs his dressing changes at home it would be beneficial for him to do this as well. Otherwise there's no evidence of infection. He is still waiting on his CT scan and appointments to determine whether he is going to have another surgery. 09/11/2016 -- the patient has had a CT scan and  is awaiting his surgical appointment prior to deciding on any surgical intervention. 09/25/16 patient presents today for fault evaluation concerning his ongoing abdominal surgical wounds. Unfortunately they do not appear to be improving significantly but rather fluctuate from a little better to little worse week by week. Obviously this is somewhat frustrating for him. 10/02/2016 -- the patient has completed his CT scan, and has a review by the surgeons at the end of August. He has a new area which has opened up a little below his left abdominal wound. Objective Constitutional Vitals Time Taken: 9:40 AM, Height: 65 in, Weight: 161 lbs, BMI: 26.8, Temperature: 97.8 F, Pulse: 74 bpm, Respiratory Rate: 18 breaths/min, Blood Pressure: 146/80 mmHg. Integumentary (Hair, Skin) Wound #1 status is Open. Original cause of wound was Gradually Appeared. The wound is located on the Right Abdomen - midline. The wound measures 0.2cm length x 0.4cm width x 0.1cm depth; 0.063cm^2 area and 0.006cm^3 volume. The wound is limited to skin breakdown. There is no tunneling or undermining noted. There is a medium amount of serosanguineous drainage noted. The wound margin is flat and intact. There is large (67-100%) red granulation within the wound bed. There is no necrotic tissue within the wound bed. The periwound skin appearance exhibited: Scarring. The periwound skin appearance did not exhibit: Callus, Crepitus, Excoriation, Induration, Rash, Dry/Scaly, Maceration, Atrophie Blanche, Cyanosis, Ecchymosis, Hemosiderin Staining, Mottled, Pallor, Rubor, Erythema. Periwound temperature was noted as No Abnormality. [name], [name] ([id number]) Wound #2 status is Open. Original cause of wound was Gradually Appeared. The wound is located on the Left Abdomen - midline. The wound measures 6cm length x 0.4cm width x 0.1cm depth; 1.885cm^2 area and 0.188cm^3 volume. The wound is limited to skin breakdown. There is a large  amount of purulent drainage noted. The wound margin is flat and intact. There is medium (34-66%) red granulation within  the wound bed. There is a medium (34-66%) amount of necrotic tissue within the wound bed including Eschar and Adherent Slough. The periwound skin appearance exhibited: Induration, Scarring, Ecchymosis. The periwound skin appearance did not exhibit: Callus, Crepitus, Excoriation, Rash, Dry/Scaly, Maceration, Atrophie Blanche, Cyanosis, Hemosiderin Staining, Mottled, Pallor, Rubor, Erythema. Periwound temperature was noted as No Abnormality. Assessment Active Problems ICD-10 E11.622 - Type 2 diabetes mellitus with other skin ulcer S31.100A - Unspecified open wound of abdominal wall, right upper quadrant without penetration into peritoneal cavity, initial encounter S31.102A - Unspecified open wound of abdominal wall, epigastric region without penetration into peritoneal cavity, initial encounter F17.228 - Nicotine dependence, chewing tobacco, with other nicotine-induced disorders Plan Wound Cleansing: Wound #1 Right Abdomen - midline: Clean wound with Normal Saline. May Shower, gently pat wound dry prior to applying new dressing. Wound #2 Left Abdomen - midline: Clean wound with Normal Saline. May Shower, gently pat wound dry prior to applying new dressing. Anesthetic: Wound #1 Right Abdomen - midline: Topical Lidocaine 4% cream applied to wound bed prior to debridement Wound #2 Left Abdomen - midline: Topical Lidocaine 4% cream applied to wound bed prior to debridement Skin Barriers/Peri-Wound Care: Wound #1 Right Abdomen - midline: Skin Prep Wound #2 Left Abdomen - midline: Skin Prep [name], [name] ([id number]) Primary Wound Dressing: Wound #1 Right Abdomen - midline: Aquacel Ag Wound #2 Left Abdomen - midline: Aquacel Ag Secondary Dressing: Wound #1 Right Abdomen - midline: Other - [place] Wound #2 Left Abdomen - midline: Other - [place] Dressing Change Frequency: Wound #1 Right Abdomen - midline: Change dressing every other day. Wound #2 Left Abdomen - midline: Change dressing every other day. Follow-up Appointments: Wound #1 Right Abdomen - midline: Return Appointment in 2 weeks. Wound #2 Left Abdomen - midline: Return Appointment in 2 weeks. Additional Orders / Instructions: Wound #1 Right Abdomen - midline: Other: - Go see a Engineer, drilling about your hernias and get a surgical opinion. Wound #2 Left Abdomen - midline: Other: - Go see a Engineer, drilling about your hernias and get a surgical opinion. We will await the CT scan review and further opinion from the surgeons, which is in the third week of August. In the meanwhile I have recommended: 1. silver alginate and a bordered foam to be applied daily after washing with soap and water. 2. Good control of his diabetes mellitus 3. Adequate protein, vitamin A, vitamin C and zinc. 4. We will continue with supportive care and try and heal this wound to the best of our ability. He would like to spread out his visits as the patient is awaiting his surgical opinion. Electronic Signature(s) Signed: 10/02/2016 10:22:24 AM By: Christin Fudge MD, FACS Entered By: Christin Fudge on 10/02/2016 10:22:24 [name] ([id number]) -------------------------------------------------------------------------------- SuperBill Details Patient Name: [name] Date of Service: 10/02/2016 Medical Record Number: [id number] Patient Account Number: [id number] Date of Birth/Sex: October 21, 1945 (71 y.o. Male) Treating RN: Carolyne Fiscal, Debi Primary Care Provider: Thomes Lolling Other Clinician: Referring Provider: Thomes Lolling Treating Provider/Extender: Frann Rider in Treatment: 13 Diagnosis Coding ICD-10 Codes Code Description E11.622 Type 2 diabetes mellitus with other skin ulcer Unspecified open wound of abdominal wall, right upper quadrant without penetration  into S31.100A peritoneal cavity, initial encounter Unspecified open wound of abdominal wall, epigastric region without penetration into S31.102A peritoneal cavity, initial encounter F17.228 Nicotine dependence, chewing tobacco, with other nicotine-induced disorders Facility Procedures CPT4 Code: [id number] Description: 99213 - WOUND CARE VISIT-LEV 3 EST PT Modifier: Quantity:  1 Physician Procedures CPT4: Description Modifier Quantity Code [id number] 70761 - WC PHYS LEVEL 3 - EST PT 1 ICD-10 Description Diagnosis E11.622 Type 2 diabetes mellitus with other skin ulcer S31.100A Unspecified open wound of abdominal wall, right upper quadrant without  penetration into peritoneal cavity, initial encounter S31.102A Unspecified open wound of abdominal wall, epigastric region without penetration into peritoneal cavity, initial encounter Electronic Signature(s) Signed: 10/02/2016 4:15:42 PM By: Christin Fudge MD, FACS Signed: 10/02/2016 4:27:45 PM By: Alric Quan Previous Signature: 10/02/2016 10:22:36 AM Version By: Christin Fudge MD, FACS Entered By: Alric Quan on 10/02/2016 11:48:25

## 2016-10-10 MED ORDER — SPIRIVA WITH HANDIHALER 18 MCG AND INHALATION CAPSULES
3 refills | 0 days | Status: CP
Start: 2016-10-10 — End: 2017-02-23

## 2016-10-16 ENCOUNTER — Encounter: Payer: Medicare Other | Attending: Physician Assistant | Admitting: Physician Assistant

## 2016-10-16 DIAGNOSIS — Z79891 Long term (current) use of opiate analgesic: Secondary | ICD-10-CM | POA: Insufficient documentation

## 2016-10-16 DIAGNOSIS — E538 Deficiency of other specified B group vitamins: Secondary | ICD-10-CM | POA: Insufficient documentation

## 2016-10-16 DIAGNOSIS — K219 Gastro-esophageal reflux disease without esophagitis: Secondary | ICD-10-CM | POA: Diagnosis not present

## 2016-10-16 DIAGNOSIS — Z79899 Other long term (current) drug therapy: Secondary | ICD-10-CM | POA: Diagnosis not present

## 2016-10-16 DIAGNOSIS — S31102A Unspecified open wound of abdominal wall, epigastric region without penetration into peritoneal cavity, initial encounter: Secondary | ICD-10-CM | POA: Diagnosis not present

## 2016-10-16 DIAGNOSIS — G8929 Other chronic pain: Secondary | ICD-10-CM | POA: Insufficient documentation

## 2016-10-16 DIAGNOSIS — Z7984 Long term (current) use of oral hypoglycemic drugs: Secondary | ICD-10-CM | POA: Diagnosis not present

## 2016-10-16 DIAGNOSIS — F17228 Nicotine dependence, chewing tobacco, with other nicotine-induced disorders: Secondary | ICD-10-CM | POA: Insufficient documentation

## 2016-10-16 DIAGNOSIS — X58XXXA Exposure to other specified factors, initial encounter: Secondary | ICD-10-CM | POA: Diagnosis not present

## 2016-10-16 DIAGNOSIS — M1990 Unspecified osteoarthritis, unspecified site: Secondary | ICD-10-CM | POA: Insufficient documentation

## 2016-10-16 DIAGNOSIS — S31100A Unspecified open wound of abdominal wall, right upper quadrant without penetration into peritoneal cavity, initial encounter: Secondary | ICD-10-CM | POA: Insufficient documentation

## 2016-10-16 DIAGNOSIS — E11622 Type 2 diabetes mellitus with other skin ulcer: Secondary | ICD-10-CM | POA: Insufficient documentation

## 2016-10-16 DIAGNOSIS — E1142 Type 2 diabetes mellitus with diabetic polyneuropathy: Secondary | ICD-10-CM | POA: Diagnosis not present

## 2016-10-16 DIAGNOSIS — M549 Dorsalgia, unspecified: Secondary | ICD-10-CM | POA: Insufficient documentation

## 2016-10-16 DIAGNOSIS — I10 Essential (primary) hypertension: Secondary | ICD-10-CM | POA: Diagnosis not present

## 2016-10-16 DIAGNOSIS — J449 Chronic obstructive pulmonary disease, unspecified: Secondary | ICD-10-CM | POA: Insufficient documentation

## 2016-10-17 ENCOUNTER — Ambulatory Visit: Admission: RE | Admit: 2016-10-17 | Discharge: 2016-10-17 | Payer: MEDICARE

## 2016-10-17 DIAGNOSIS — R490 Dysphonia: Secondary | ICD-10-CM

## 2016-10-17 NOTE — Progress Notes (Signed)
ORA, MCNATT ([id number]) Visit Report for 10/16/2016 Arrival Information Details Patient Name: [name], [name]. Date of Service: 10/16/2016 9:15 AM Medical Record Number: [id number] Patient Account Number: [id number] Date of Birth/Sex: [birth year] (70 y.o. Male) Treating RN: Montey Hora Primary Care Latoya Maulding: Thomes Lolling Other Clinician: Referring Jashley Yellin: Thomes Lolling Treating Elany Jahi/Extender: Sharalyn Ink in Treatment: 15 Visit Information History Since Last Visit Added or deleted any medications: No Patient Arrived: Ambulatory Any new allergies or adverse reactions: No Arrival Time: 09:31 Had a fall or experienced change in No Accompanied By: self activities of daily living that may affect Transfer Assistance: None risk of falls: Patient Identification Verified: Yes Signs or symptoms of abuse/neglect since last No Secondary Verification Process Yes visito Completed: Hospitalized since last visit: No Patient Requires Transmission-Based No Has Dressing in Place as Prescribed: Yes Precautions: Pain Present Now: No Patient Has Alerts: Yes Patient Alerts: DMII Electronic Signature(s) Signed: 10/16/2016 4:48:06 PM By: Montey Hora Entered By: Montey Hora on 10/16/2016 09:31:35 [name] ([id number]) -------------------------------------------------------------------------------- Clinic Level of Care Assessment Details Patient Name: [name] Date of Service: 10/16/2016 9:15 AM Medical Record Number: [id number] Patient Account Number: [id number] Date of Birth/Sex: Aug 04, 1945 (71 y.o. Male) Treating RN: Montey Hora Primary Care Linn Goetze: Thomes Lolling Other Clinician: Referring Shamicka Inga: Thomes Lolling Treating Thoms Barthelemy/Extender: Sharalyn Ink in Treatment: 15 Clinic Level of Care Assessment Items TOOL 4 Quantity Score []  - Use when only an EandM is performed on FOLLOW-UP visit 0 ASSESSMENTS - Nursing Assessment / Reassessment X -  Reassessment of Co-morbidities (includes updates in patient status) 1 10 X - Reassessment of Adherence to Treatment Plan 1 5 ASSESSMENTS - Wound and Skin Assessment / Reassessment []  - Simple Wound Assessment / Reassessment - one wound 0 X - Complex Wound Assessment / Reassessment - multiple wounds 2 5 []  - Dermatologic / Skin Assessment (not related to wound area) 0 ASSESSMENTS - Focused Assessment []  - Circumferential Edema Measurements - multi extremities 0 []  - Nutritional Assessment / Counseling / Intervention 0 []  - Lower Extremity Assessment (monofilament, tuning fork, pulses) 0 []  - Peripheral Arterial Disease Assessment (using hand held doppler) 0 ASSESSMENTS - Ostomy and/or Continence Assessment and Care []  - Incontinence Assessment and Management 0 []  - Ostomy Care Assessment and Management (repouching, etc.) 0 PROCESS - Coordination of Care X - Simple Patient / Family Education for ongoing care 1 15 []  - Complex (extensive) Patient / Family Education for ongoing care 0 []  - Staff obtains Programmer, systems, Records, Test Results / Process Orders 0 []  - Staff telephones HHA, Nursing Homes / Clarify orders / etc 0 []  - Routine Transfer to another Facility (non-emergent condition) 0 [name], [name] ([id number]) []  - Routine Hospital Admission (non-emergent condition) 0 []  - New Admissions / Biomedical engineer / Ordering NPWT, Apligraf, etc. 0 []  - Emergency Hospital Admission (emergent condition) 0 X - Simple Discharge Coordination 1 10 []  - Complex (extensive) Discharge Coordination 0 PROCESS - Special Needs []  - Pediatric / Minor Patient Management 0 []  - Isolation Patient Management 0 []  - Hearing / Language / Visual special needs 0 []  - Assessment of Community assistance (transportation, D/C planning, etc.) 0 []  - Additional assistance / Altered mentation 0 []  - Support Surface(s) Assessment (bed, cushion, seat, etc.) 0 INTERVENTIONS - Wound Cleansing / Measurement []  -  Simple Wound Cleansing - one wound 0 X - Complex Wound Cleansing - multiple wounds 2 5 X - Wound Imaging (photographs - any number of wounds)  1 5 []  - Wound Tracing (instead of photographs) 0 []  - Simple Wound Measurement - one wound 0 X - Complex Wound Measurement - multiple wounds 2 5 INTERVENTIONS - Wound Dressings X - Small Wound Dressing one or multiple wounds 2 10 []  - Medium Wound Dressing one or multiple wounds 0 []  - Large Wound Dressing one or multiple wounds 0 []  - Application of Medications - topical 0 []  - Application of Medications - injection 0 INTERVENTIONS - Miscellaneous []  - External ear exam 0 [name], [name] ([id number]) []  - Specimen Collection (cultures, biopsies, blood, body fluids, etc.) 0 []  - Specimen(s) / Culture(s) sent or taken to Lab for analysis 0 []  - Patient Transfer (multiple staff / Harrel Lemon Lift / Similar devices) 0 []  - Simple Staple / Suture removal (25 or less) 0 []  - Complex Staple / Suture removal (26 or more) 0 []  - Hypo / Hyperglycemic Management (close monitor of Blood Glucose) 0 []  - Ankle / Brachial Index (ABI) - do not check if billed separately 0 X - Vital Signs 1 5 Has the patient been seen at the hospital within the last three years: Yes Total Score: 100 Level Of Care: New/Established - Level 3 Electronic Signature(s) Signed: 10/16/2016 4:48:06 PM By: Montey Hora Entered By: Montey Hora on 10/16/2016 09:57:20 [name] ([id number]) -------------------------------------------------------------------------------- Encounter Discharge Information Details Patient Name: [name] Date of Service: 10/16/2016 9:15 AM Medical Record Number: [id number] Patient Account Number: [id number] Date of Birth/Sex: [birth year] (71 y.o. Male) Treating RN: Montey Hora Primary Care Jacque Garrels: Thomes Lolling Other Clinician: Referring Skylen Spiering: Thomes Lolling Treating Rosabell Geyer/Extender: Melburn Hake, HOYT Weeks in Treatment: 15 Encounter  Discharge Information Items Discharge Pain Level: 0 Discharge Condition: Stable Ambulatory Status: Ambulatory Discharge Destination: Home Transportation: Private Auto Accompanied By: self Schedule Follow-up Appointment: Yes Medication Reconciliation completed No and provided to Patient/Care Cina Klumpp: Patient Clinical Summary of Care: Declined Electronic Signature(s) Signed: 10/16/2016 9:52:38 AM By: [name] Entered By: [name] on 10/16/2016 09:52:38 [name] ([id number]) -------------------------------------------------------------------------------- Multi Wound Chart Details Patient Name: [name] Date of Service: 10/16/2016 9:15 AM Medical Record Number: [id number] Patient Account Number: [id number] Date of Birth/Sex: [birth year] (71 y.o. Male) Treating RN: Montey Hora Primary Care Kenedee Molesky: Thomes Lolling Other Clinician: Referring Dmitry Macomber: Thomes Lolling Treating Allanah Mcfarland/Extender: Melburn Hake, HOYT Weeks in Treatment: 15 Vital Signs Height(in): 65 Pulse(bpm): 90 Weight(lbs): 161 Blood Pressure 133/70 (mmHg): Body Mass Index(BMI): 27 Temperature(F): 97.9 Respiratory Rate 18 (breaths/min): Photos: [1:No Photos] [2:No Photos] [N/A:N/A] Wound Location: [1:Right Abdomen - midline] [2:Left Abdomen - midline] [N/A:N/A] Wounding Event: [1:Gradually Appeared] [2:Gradually Appeared] [N/A:N/A] Primary Etiology: [1:Atypical] [2:Atypical] [N/A:N/A] Comorbid History: [1:Cataracts, Chronic Obstructive Pulmonary Disease (COPD), Hypertension, Type II Diabetes, Osteoarthritis, Neuropathy] [2:Cataracts, Chronic Obstructive Pulmonary Disease (COPD), Hypertension, Type II Diabetes, Osteoarthritis,  Neuropathy] [N/A:N/A] Date Acquired: [1:05/17/2015] [2:05/17/2015] [N/A:N/A] Weeks of Treatment: [1:15] [2:15] [N/A:N/A] Wound Status: [1:Open] [2:Open] [N/A:N/A] Clustered Wound: [1:No] [2:Yes] [N/A:N/A] Clustered Quantity: [1:N/A] [2:3] [N/A:N/A] Measurements L x W x  D 0.2x0.4x0.1 [2:5.7x0.5x0.1] [N/A:N/A] (cm) Area (cm) : [1:0.063] [2:2.238] [N/A:N/A] Volume (cm) : [1:0.006] [2:0.224] [N/A:N/A] % Reduction in Area: [1:98.20%] [2:57.60%] [N/A:N/A] % Reduction in Volume: 98.30% [2:57.60%] [N/A:N/A] Classification: [1:Full Thickness Without Exposed Support Structures] [2:Full Thickness Without Exposed Support Structures] [N/A:N/A] Exudate Amount: [1:Medium] [2:Large] [N/A:N/A] Exudate Type: [1:Serosanguineous] [2:Purulent] [N/A:N/A] Exudate Color: [1:red, Kersten] [2:yellow, [place], green] [N/A:N/A] Wound Margin: [1:Flat and Intact] [2:Flat and Intact] [N/A:N/A] Granulation Amount: [1:Large (67-100%)] [2:Medium (34-66%)] [N/A:N/A] Granulation Quality: [1:Red] [2:Red] [N/A:N/A] Necrotic Amount: None  Present (0%) Medium (34-66%) N/A Necrotic Tissue: N/A Eschar, Adherent Slough N/A Exposed Structures: Fascia: No Fascia: No N/A Fat Layer (Subcutaneous Fat Layer (Subcutaneous Tissue) Exposed: No Tissue) Exposed: No Tendon: No Tendon: No Muscle: No Muscle: No Joint: No Joint: No Bone: No Bone: No Limited to Skin Limited to Skin Breakdown Breakdown Epithelialization: Medium (34-66%) Small (1-33%) N/A Periwound Skin Texture: Scarring: Yes Induration: Yes N/A Excoriation: No Scarring: Yes Induration: No Excoriation: No Callus: No Callus: No Crepitus: No Crepitus: No Rash: No Rash: No Periwound Skin Maceration: No Maceration: No N/A Moisture: Dry/Scaly: No Dry/Scaly: No Periwound Skin Color: Atrophie Blanche: No Ecchymosis: Yes N/A Cyanosis: No Atrophie Blanche: No Ecchymosis: No Cyanosis: No Erythema: No Erythema: No Hemosiderin Staining: No Hemosiderin Staining: No Mottled: No Mottled: No Pallor: No Pallor: No Rubor: No Rubor: No Temperature: No Abnormality No Abnormality N/A Tenderness on No No N/A Palpation: Wound Preparation: Ulcer Cleansing: Ulcer Cleansing: N/A Rinsed/Irrigated with Rinsed/Irrigated with Saline  Saline Topical Anesthetic Topical Anesthetic Applied: Other: lidocaine Applied: Other: lidocaine 4% 4% Treatment Notes Electronic Signature(s) Signed: 10/16/2016 4:48:06 PM By: Montey Hora Entered By: Montey Hora on 10/16/2016 09:44:55 [name] ([id number]) -------------------------------------------------------------------------------- [place] Details Patient Name: [name], [name] Date of Service: 10/16/2016 9:15 AM Medical Record Number: [id number] Patient Account Number: [id number] Date of Birth/Sex: [birth year] (71 y.o. Male) Treating RN: Montey Hora Primary Care Rosalynd Mcwright: Thomes Lolling Other Clinician: Referring Luci Bellucci: Thomes Lolling Treating Treina Arscott/Extender: Melburn Hake, HOYT Weeks in Treatment: 15 Active Inactive ` Abuse / Safety / Falls / Self Care Management Nursing Diagnoses: Impaired physical mobility Goals: Patient will remain injury free Date Initiated: 07/03/2016 Target Resolution Date: 09/14/2016 Goal Status: Active Interventions: Assess fall risk on admission and as needed Notes: ` Orientation to the Wound Care Program Nursing Diagnoses: Knowledge deficit related to the wound healing center program Goals: Patient/caregiver will verbalize understanding of the [facility] Date Initiated: 07/03/2016 Target Resolution Date: 09/15/2016 Goal Status: Active Interventions: Provide education on orientation to the wound center Notes: ` Wound/Skin Impairment Nursing Diagnoses: Impaired tissue integrity [name], [name] ([id number]) Goals: Patient/caregiver will verbalize understanding of skin care regimen Date Initiated: 07/03/2016 Target Resolution Date: 09/15/2016 Goal Status: Active Ulcer/skin breakdown will have a volume reduction of 30% by week 4 Date Initiated: 07/03/2016 Target Resolution Date: 09/15/2016 Goal Status: Active Ulcer/skin breakdown will have a volume reduction of 50% by week 8 Date Initiated:  07/03/2016 Target Resolution Date: 09/15/2016 Goal Status: Active Ulcer/skin breakdown will have a volume reduction of 80% by week 12 Date Initiated: 07/03/2016 Target Resolution Date: 09/15/2016 Goal Status: Active Ulcer/skin breakdown will heal within 14 weeks Date Initiated: 07/03/2016 Target Resolution Date: 09/15/2016 Goal Status: Active Interventions: Assess patient/caregiver ability to obtain necessary supplies Assess patient/caregiver ability to perform ulcer/skin care regimen upon admission and as needed Assess ulceration(s) every visit Notes: Electronic Signature(s) Signed: 10/16/2016 4:48:06 PM By: Montey Hora Entered By: Montey Hora on 10/16/2016 09:44:47 [name] ([id number]) -------------------------------------------------------------------------------- Pain Assessment Details Patient Name: [name] Date of Service: 10/16/2016 9:15 AM Medical Record Number: [id number] Patient Account Number: [id number] Date of Birth/Sex: [birth year] (71 y.o. Male) Treating RN: Montey Hora Primary Care Barrington Worley: Thomes Lolling Other Clinician: Referring Auren Valdes: Thomes Lolling Treating Arland Usery/Extender: Melburn Hake, HOYT Weeks in Treatment: 15 Active Problems Location of Pain Severity and Description of Pain Patient Has Paino No Site Locations Pain Management and Medication Current Pain Management: Notes Topical or injectable lidocaine is offered to patient for acute pain when surgical  debridement is performed. If needed, Patient is instructed to use over the counter pain medication for the following 24-48 hours after debridement. Wound care MDs do not prescribed pain medications. Patient has chronic pain or uncontrolled pain. Patient has been instructed to make an appointment with their Primary Care Physician for pain management. Electronic Signature(s) Signed: 10/16/2016 4:48:06 PM By: Montey Hora Entered By: Montey Hora on 10/16/2016 09:31:44 [name]  ([id number]) -------------------------------------------------------------------------------- Patient/Caregiver Education Details Patient Name: [name], [name] Date of Service: 10/16/2016 9:15 AM Medical Record Number: [id number] Patient Account Number: [id number] Date of Birth/Gender: December 09, 1945 (71 y.o. Male) Treating RN: Montey Hora Primary Care Physician: Thomes Lolling Other Clinician: Referring Physician: Thomes Lolling Treating Physician/Extender: Sharalyn Ink in Treatment: 15 Education Assessment Education Provided To: Patient Education Topics Provided Wound/Skin Impairment: Handouts: Other: wound care as ordered Methods: Demonstration, Explain/Verbal Responses: State content correctly Electronic Signature(s) Signed: 10/16/2016 4:48:06 PM By: Montey Hora Entered By: Montey Hora on 10/16/2016 09:45:42 [name] ([id number]) -------------------------------------------------------------------------------- Wound Assessment Details Patient Name: [name] Date of Service: 10/16/2016 9:15 AM Medical Record Number: [id number] Patient Account Number: [id number] Date of Birth/Sex: [birth year] (71 y.o. Male) Treating RN: Montey Hora Primary Care Orvie Caradine: Thomes Lolling Other Clinician: Referring Nasir Bright: Thomes Lolling Treating Dempsy Damiano/Extender: Melburn Hake, HOYT Weeks in Treatment: 15 Wound Status Wound Number: 1 Primary Atypical Etiology: Wound Location: Right Abdomen - midline Wound Open Wounding Event: Gradually Appeared Status: Date Acquired: 05/17/2015 Comorbid Cataracts, Chronic Obstructive Weeks Of Treatment: 15 History: Pulmonary Disease (COPD), Clustered Wound: No Hypertension, Type II Diabetes, Osteoarthritis, Neuropathy Photos Photo Uploaded By: Montey Hora on 10/16/2016 09:58:55 Wound Measurements Length: (cm) 0.2 Width: (cm) 0.4 Depth: (cm) 0.1 Area: (cm) 0.063 Volume: (cm) 0.006 % Reduction in Area: 98.2% % Reduction in  Volume: 98.3% Epithelialization: Medium (34-66%) Tunneling: No Undermining: No Wound Description Full Thickness Without Exposed Classification: Support Structures Wound Margin: Flat and Intact Exudate Medium Amount: Exudate Type: Serosanguineous Exudate Color: red, [place] Foul Odor After Cleansing: No Slough/Fibrino No Wound Bed Granulation Amount: Large (67-100%) Exposed Structure [name], [name] ([id number]) Granulation Quality: Red Fascia Exposed: No Necrotic Amount: None Present (0%) Fat Layer (Subcutaneous Tissue) Exposed: No Tendon Exposed: No Muscle Exposed: No Joint Exposed: No Bone Exposed: No Limited to Skin Breakdown Periwound Skin Texture Texture Color No Abnormalities Noted: No No Abnormalities Noted: No Callus: No Atrophie Blanche: No Crepitus: No Cyanosis: No Excoriation: No Ecchymosis: No Induration: No Erythema: No Rash: No Hemosiderin Staining: No Scarring: Yes Mottled: No Pallor: No Moisture Rubor: No No Abnormalities Noted: No Dry / Scaly: No Temperature / Pain Maceration: No Temperature: No Abnormality Wound Preparation Ulcer Cleansing: Rinsed/Irrigated with Saline Topical Anesthetic Applied: Other: lidocaine 4%, Treatment Notes Wound #1 (Right Abdomen - midline) 1. Cleansed with: Clean wound with Normal Saline 2. Anesthetic Topical Lidocaine 4% cream to wound bed prior to debridement 3. Peri-wound Care: Skin Prep 4. Dressing Applied: Aquacel Ag 5. Secondary Jenkins Signature(s) Signed: 10/16/2016 4:48:06 PM By: Montey Hora Entered By: Montey Hora on 10/16/2016 09:43:39 [name] ([id number]) -------------------------------------------------------------------------------- Wound Assessment Details Patient Name: [name] Date of Service: 10/16/2016 9:15 AM Medical Record Number: [id number] Patient Account Number: [id number] Date of Birth/Sex: Jan 23, 1946 (71 y.o. Male) Treating  RN: Montey Hora Primary Care Hughes Wyndham: Thomes Lolling Other Clinician: Referring Xoey Warmoth: Thomes Lolling Treating Didi Ganaway/Extender: Melburn Hake, HOYT Weeks in Treatment: 15 Wound Status Wound Number: 2 Primary Atypical Etiology: Wound Location: Left Abdomen - midline Wound Open Wounding  Event: Gradually Appeared Status: Date Acquired: 05/17/2015 Comorbid Cataracts, Chronic Obstructive Weeks Of Treatment: 15 History: Pulmonary Disease (COPD), Clustered Wound: Yes Hypertension, Type II Diabetes, Osteoarthritis, Neuropathy Photos Photo Uploaded By: Montey Hora on 10/16/2016 09:58:55 Wound Measurements Length: (cm) 5.7 Width: (cm) 0.5 Depth: (cm) 0.1 Clustered Quantity: 3 Area: (cm) 2.238 Volume: (cm) 0.224 % Reduction in Area: 57.6% % Reduction in Volume: 57.6% Epithelialization: Small (1-33%) Tunneling: No Undermining: No Wound Description Full Thickness Without Exposed Foul Odor Afte Classification: Support Structures Slough/Fibrino Wound Margin: Flat and Intact Exudate Large Amount: Exudate Type: Purulent Exudate Color: yellow, Panjwani, green r Cleansing: No No Wound Bed [name], [name] ([id number]) Granulation Amount: Medium (34-66%) Exposed Structure Granulation Quality: Red Fascia Exposed: No Necrotic Amount: Medium (34-66%) Fat Layer (Subcutaneous Tissue) Exposed: No Necrotic Quality: Eschar, Adherent Slough Tendon Exposed: No Muscle Exposed: No Joint Exposed: No Bone Exposed: No Limited to Skin Breakdown Periwound Skin Texture Texture Color No Abnormalities Noted: No No Abnormalities Noted: No Callus: No Atrophie Blanche: No Crepitus: No Cyanosis: No Excoriation: No Ecchymosis: Yes Induration: Yes Erythema: No Rash: No Hemosiderin Staining: No Scarring: Yes Mottled: No Pallor: No Moisture Rubor: No No Abnormalities Noted: No Dry / Scaly: No Temperature / Pain Maceration: No Temperature: No Abnormality Wound Preparation Ulcer  Cleansing: Rinsed/Irrigated with Saline Topical Anesthetic Applied: Other: lidocaine 4%, Treatment Notes Wound #2 (Left Abdomen - midline) 1. Cleansed with: Clean wound with Normal Saline 2. Anesthetic Topical Lidocaine 4% cream to wound bed prior to debridement 3. Peri-wound Care: Skin Prep 4. Dressing Applied: Aquacel Ag 5. Secondary [place] Signature(s) Signed: 10/16/2016 4:48:06 PM By: Montey Hora Entered By: Montey Hora on 10/16/2016 09:43:54 [name] ([id number]) -------------------------------------------------------------------------------- [place] Details Patient Name: [name] Date of Service: 10/16/2016 9:15 AM Medical Record Number: [id number] Patient Account Number: [id number] Date of Birth/Sex: Nov 16, 1945 (71 y.o. Male) Treating RN: Montey Hora Primary Care Aquila Delaughter: Thomes Lolling Other Clinician: Referring Suly Vukelich: Thomes Lolling Treating Ammaar Encina/Extender: Melburn Hake, HOYT Weeks in Treatment: 15 Vital Signs Time Taken: 09:31 Temperature (F): 97.9 Height (in): 65 Pulse (bpm): 90 Weight (lbs): 161 Respiratory Rate (breaths/min): 18 Body Mass Index (BMI): 26.8 Blood Pressure (mmHg): 133/70 Reference Range: 80 - 120 mg / dl Electronic Signature(s) Signed: 10/16/2016 4:48:06 PM By: Montey Hora Entered By: Montey Hora on 10/16/2016 09:33:28

## 2016-10-17 NOTE — Progress Notes (Signed)
[name] ([id number]) Visit Report for 10/16/2016 Chief Complaint Document Details Patient Name: [name], [name]. Date of Service: 10/16/2016 9:15 AM Medical Record Number: [id number] Patient Account Number: [id number] Date of Birth/Sex: 29-Dec-1945 (71 y.o. Male) Treating RN: [name] Primary Care Provider: Thomes Jackson Other Clinician: Referring Provider: Thomes Jackson Treating Provider/Extender: [name] in Treatment: 15 Information Obtained from: Patient Chief Complaint Patients presents for treatment of an open diabetic ulcer to the anterior abdominal wall in the epigastric and right upper quadrant Electronic Signature(s) Signed: 10/16/2016 4:13:08 PM By: Michael Keeler PA-C Entered By: [name] on 10/16/2016 09:41:38 [name] ([id number]) -------------------------------------------------------------------------------- HPI Details Patient Name: [name] Date of Service: 10/16/2016 9:15 AM Medical Record Number: [id number] Patient Account Number: [id number] Date of Birth/Sex: [birth year] (71 y.o. Male) Treating RN: [name] Primary Care Provider: Thomes Jackson Other Clinician: Referring Provider: Thomes Jackson Treating Provider/Extender: [name], [name] in Treatment: 15 History of Present Illness Location: abdomen wall wounds,-- right upper quadrant and epigastric region Quality: Patient reports experiencing a dull pain to affected area(s). Severity: Patient states wound are getting worse. Duration: Patient has had the wound for >5 year's prior to seeking treatment at the wound center Timing: Pain in wound is Intermittent (comes and goes Context: The wound appeared gradually over time Modifying Factors: Consults to this date include:not seen a surgeon and only sees his PCP for his medical complaints Associated Signs and Symptoms: Patient reports having increase discharge. HPI Description: 71 year old gentleman has been seen  recently by his PCP [name], who sees him with a history of diabetes mellitus, peripheral neuropathy, B12 deficiency, hypertension, osteoarthritis and the patient had recent blood sugar checked which was 163. Past medical history includes essential hypertension, incisional hernia, diabetes mellitus, colon polyps, COPD, tobacco abuse in the past, chewing tobacco, GERD. He has also been treated for chronic back pain with no sciatica and is on oxycodone. His last hemoglobin A1c was 6.9% His abdominal surgery was done at [facility] over 15 years ago and gradually there was sutures which protruded and then there was mesh which protruded. He washes this during his shower but does not put any dressing and the wound is covered with a lot of debris. He has never had a surgical consultation for this. 07/10/2016 -- the patient has spoken to his PCP who said he was going to call me but I have not yet had a phone conversation with the physician. From what I understand the physician was reluctant to have a surgical opinion because of the patient's several comorbidities and he feels that the surgery may be detrimental to his overall health 07/24/2016 -- the patient's daughter is at the bedside and we've had a detailed discussion regarding his options for a surgical opinion and possible surgical intervention before this becomes a emergent situation. She will seek primary care input and take the father to [facility] for a surgical opinion. 07/31/2016 -- the patient's daughter has organized for a surgical opinion at [facility] on June 14 08/14/2016 -- it has been about 3 Jackson since he has stopped chewing tobacco. 08/28/2016 -- he was seen at [facility] by [name] -- the assessment was based on physical examination and review of his previous history of surgical repair. the patient had undergone a ventral hernia repair in 1998 with [name] at [facility] at a prior open  cholecystectomy incision. he was evaluated by [name] in 2013  and at that time it was decided to watch and wait. [name] recommended a repeat CT scan and requested records from dura and regional operative records and would follow-up the patient, after [name] PCP [name] reviewed him for overall fitness for surgery. 09/04/16 patient's wound actually appear to be doing well on evaluation today. He is not really experience [name]. ([id number]) any discomfort and the right abdominal wound is dry and almost appears to be healing up. He continues to have some drainage from the left but this seems to be more of a potential seroma which with milking is able to be cleared out. I discussed with patient that when he performs his dressing changes at home it would be beneficial for him to do this as well. Otherwise there's no evidence of infection. He is still waiting on his CT scan and appointments to determine whether he is going to have another surgery. 09/11/2016 -- the patient has had a CT scan and is awaiting his surgical appointment prior to deciding on any surgical intervention. 09/25/16 patient presents today for fault evaluation concerning his ongoing abdominal surgical wounds. Unfortunately they do not appear to be improving significantly but rather fluctuate from a little better to little worse week by week. Obviously this is somewhat frustrating for him. 10/02/2016 -- the patient has completed his CT scan, and has a review by the surgeons at the end of August. He has a new area which has opened up a little below his left abdominal wound. 10/16/16 on evaluation today patient's wounds on the abdominal region appears to be doing about the same. He actually has an appointment with his surgeon on the 21st for evaluation regarding the required products appointment. Obviously he is having a difficult time with the feeling of these wounds. Fortunately there does not appear to be any  evidence of local infection such as significant amount of erythema or streaking. She also has an audio, vomiting, or diarrhea. He is not have any discomfort at this point. Electronic Signature(s) Signed: 10/16/2016 4:13:08 PM By: Michael Keeler PA-C Entered By: [name] on 10/16/2016 09:58:56 [name] ([id number]) -------------------------------------------------------------------------------- Physical Exam Details Patient Name: [name], [name] Date of Service: 10/16/2016 9:15 AM Medical Record Number: [id number] Patient Account Number: [id number] Date of Birth/Sex: Mar 21, 1945 (71 y.o. Male) Treating RN: [name] Primary Care Provider: Thomes Jackson Other Clinician: Referring Provider: Thomes Jackson Treating Provider/Extender: [name], [name] in Treatment: 95 Constitutional Well-nourished and well-hydrated in no acute distress. Respiratory normal breathing without difficulty. clear to auscultation bilaterally. Cardiovascular regular rate and rhythm with normal S1, S2. Psychiatric this patient is able to make decisions and demonstrates good insight into disease process. Alert and Oriented x 3. pleasant and cooperative. Notes Patient's wounds appeared to be granular in nature at this point unfortunately there is no surrounding erythema. No need for debridement at this point. Electronic Signature(s) Signed: 10/16/2016 4:13:08 PM By: Michael Keeler PA-C Entered By: [name] on 10/16/2016 09:59:21 [name] ([id number]) -------------------------------------------------------------------------------- Physician Orders Details Patient Name: [name], [name] Date of Service: 10/16/2016 9:15 AM Medical Record Number: [id number] Patient Account Number: [id number] Date of Birth/Sex: 26-May-1945 (71 y.o. Male) Treating RN: [name] Primary Care Provider: Thomes Jackson Other Clinician: Referring Provider: Thomes Jackson Treating Provider/Extender: [name] in Treatment: 15 Verbal / Phone Orders: No Diagnosis Coding ICD-10 Coding Code Description E11.622 Type 2 diabetes mellitus with other skin ulcer Unspecified open wound of  abdominal wall, right upper quadrant without penetration into S31.100A peritoneal cavity, initial encounter Unspecified open wound of abdominal wall, epigastric region without penetration into S31.102A peritoneal cavity, initial encounter F17.228 Nicotine dependence, chewing tobacco, with other nicotine-induced disorders Wound Cleansing Wound #1 Right Abdomen - midline o Clean wound with Normal Saline. o May Shower, gently pat wound dry prior to applying new dressing. Wound #2 Left Abdomen - midline o Clean wound with Normal Saline. o May Shower, gently pat wound dry prior to applying new dressing. Anesthetic Wound #1 Right Abdomen - midline o Topical Lidocaine 4% cream applied to wound bed prior to debridement Wound #2 Left Abdomen - midline o Topical Lidocaine 4% cream applied to wound bed prior to debridement Skin Barriers/Peri-Wound Care Wound #1 Right Abdomen - midline o Skin Prep Wound #2 Left Abdomen - midline o Skin Prep Primary Wound Dressing Wound #1 Right Abdomen - midline o Aquacel Ag [name], [name] ([id number]) Wound #2 Left Abdomen - midline o Aquacel Ag Secondary Dressing Wound #1 Right Abdomen - midline o Other - [place] Wound #2 Left Abdomen - midline o Other - [place] Dressing Change Frequency Wound #1 Right Abdomen - midline o Change dressing every other day. Wound #2 Left Abdomen - midline o Change dressing every other day. Follow-up Appointments Wound #1 Right Abdomen - midline o Return Appointment in 2 Jackson. Wound #2 Left Abdomen - midline o Return Appointment in 2 Jackson. Additional Orders / Instructions Wound #1 Right Abdomen - midline o Other: - Go see a Engineer, drilling about your hernias and get a surgical  opinion. Wound #2 Left Abdomen - midline o Other: - Go see a Engineer, drilling about your hernias and get a surgical opinion. Notes We will continue with the Current wound care measures for the next two Jackson. We will see him for reevaluation at that point. If anything worsens in the interim he will contact our office for additional recommendations. Electronic Signature(s) Signed: 10/16/2016 4:13:08 PM By: Michael Keeler PA-C Entered By: [name] on 10/16/2016 09:59:45 [name] ([id number]) -------------------------------------------------------------------------------- Problem List Details Patient Name: [name], [name] Date of Service: 10/16/2016 9:15 AM Medical Record Number: [id number] Patient Account Number: [id number] Date of Birth/Sex: 06-Apr-1945 (71 y.o. Male) Treating RN: [name] Primary Care Provider: Thomes Jackson Other Clinician: Referring Provider: Thomes Jackson Treating Provider/Extender: [name] in Treatment: 15 Active Problems ICD-10 Encounter Code Description Active Date Diagnosis E11.622 Type 2 diabetes mellitus with other skin ulcer 07/03/2016 Yes S31.100A Unspecified open wound of abdominal wall, right upper 07/03/2016 Yes quadrant without penetration into peritoneal cavity, initial encounter S31.102A Unspecified open wound of abdominal wall, epigastric 07/03/2016 Yes region without penetration into peritoneal cavity, initial encounter F17.228 Nicotine dependence, chewing tobacco, with other 07/03/2016 Yes nicotine-induced disorders Inactive Problems Resolved Problems Electronic Signature(s) Signed: 10/16/2016 4:13:08 PM By: Michael Keeler PA-C Entered By: [name] on 10/16/2016 09:41:30 [name] ([id number]) -------------------------------------------------------------------------------- Progress Note Details Patient Name: [name] Date of Service: 10/16/2016 9:15 AM Medical Record Number: [id number] Patient  Account Number: [id number] Date of Birth/Sex: 03-May-1945 (71 y.o. Male) Treating RN: [name] Primary Care Provider: Thomes Jackson Other Clinician: Referring Provider: Thomes Jackson Treating Provider/Extender: [name] in Treatment: 15 Subjective Chief Complaint Information obtained from Patient Patients presents for treatment of an open diabetic ulcer to the anterior abdominal wall in the epigastric and right upper quadrant History of Present Illness (HPI) The following HPI elements were  documented for the patient's wound: Location: abdomen wall wounds,-- right upper quadrant and epigastric region Quality: Patient reports experiencing a dull pain to affected area(s). Severity: Patient states wound are getting worse. Duration: Patient has had the wound for >5 year's prior to seeking treatment at the wound center Timing: Pain in wound is Intermittent (comes and goes Context: The wound appeared gradually over time Modifying Factors: Consults to this date include:not seen a surgeon and only sees his PCP for his medical complaints Associated Signs and Symptoms: Patient reports having increase discharge. 71 year old gentleman has been seen recently by his PCP [name], who sees him with a history of diabetes mellitus, peripheral neuropathy, B12 deficiency, hypertension, osteoarthritis and the patient had recent blood sugar checked which was 163. Past medical history includes essential hypertension, incisional hernia, diabetes mellitus, colon polyps, COPD, tobacco abuse in the past, chewing tobacco, GERD. He has also been treated for chronic back pain with no sciatica and is on oxycodone. His last hemoglobin A1c was 6.9% His abdominal surgery was done at [facility] over 15 years ago and gradually there was sutures which protruded and then there was mesh which protruded. He washes this during his shower but does not put any dressing and the wound is covered with a  lot of debris. He has never had a surgical consultation for this. 07/10/2016 -- the patient has spoken to his PCP who said he was going to call me but I have not yet had a phone conversation with the physician. From what I understand the physician was reluctant to have a surgical opinion because of the patient's several comorbidities and he feels that the surgery may be detrimental to his overall health 07/24/2016 -- the patient's daughter is at the bedside and we've had a detailed discussion regarding his options for a surgical opinion and possible surgical intervention before this becomes a emergent situation. She will seek primary care input and take the father to [facility] for a surgical opinion. 07/31/2016 -- the patient's daughter has organized for a surgical opinion at [facility] on June 14 08/14/2016 -- it has been about 3 Jackson since he has stopped chewing tobacco. [name], [name] ([id number]) 08/28/2016 -- he was seen at [facility] by [name] -- the assessment was based on physical examination and review of his previous history of surgical repair. the patient had undergone a ventral hernia repair in 1998 with [name] at [facility] at a prior open cholecystectomy incision. he was evaluated by [name] in 2013 and at that time it was decided to watch and wait. [name] recommended a repeat CT scan and requested records from dura and regional operative records and would follow-up the patient, after Mr. Agresta PCP [name] reviewed him for overall fitness for surgery. 09/04/16 patient's wound actually appear to be doing well on evaluation today. He is not really experience any discomfort and the right abdominal wound is dry and almost appears to be healing up. He continues to have some drainage from the left but this seems to be more of a potential seroma which with milking is able to be cleared out. I discussed with patient that when he performs his dressing  changes at home it would be beneficial for him to do this as well. Otherwise there's no evidence of infection. He is still waiting on his CT scan and appointments to determine whether he is going to have another surgery. 09/11/2016 -- the patient has had  a CT scan and is awaiting his surgical appointment prior to deciding on any surgical intervention. 09/25/16 patient presents today for fault evaluation concerning his ongoing abdominal surgical wounds. Unfortunately they do not appear to be improving significantly but rather fluctuate from a little better to little worse week by week. Obviously this is somewhat frustrating for him. 10/02/2016 -- the patient has completed his CT scan, and has a review by the surgeons at the end of August. He has a new area which has opened up a little below his left abdominal wound. 10/16/16 on evaluation today patient's wounds on the abdominal region appears to be doing about the same. He actually has an appointment with his surgeon on the 21st for evaluation regarding the required products appointment. Obviously he is having a difficult time with the feeling of these wounds. Fortunately there does not appear to be any evidence of local infection such as significant amount of erythema or streaking. She also has an audio, vomiting, or diarrhea. He is not have any discomfort at this point. Objective Constitutional Well-nourished and well-hydrated in no acute distress. Vitals Time Taken: 9:31 AM, Height: 65 in, Weight: 161 lbs, BMI: 26.8, Temperature: 97.9 F, Pulse: 90 bpm, Respiratory Rate: 18 breaths/min, Blood Pressure: 133/70 mmHg. Respiratory normal breathing without difficulty. clear to auscultation bilaterally. [name], [name] ([id number]) Cardiovascular regular rate and rhythm with normal S1, S2. Psychiatric this patient is able to make decisions and demonstrates good insight into disease process. Alert and Oriented x 3. pleasant and  cooperative. General Notes: Patient's wounds appeared to be granular in nature at this point unfortunately there is no surrounding erythema. No need for debridement at this point. Integumentary (Hair, Skin) Wound #1 status is Open. Original cause of wound was Gradually Appeared. The wound is located on the Right Abdomen - midline. The wound measures 0.2cm length x 0.4cm width x 0.1cm depth; 0.063cm^2 area and 0.006cm^3 volume. The wound is limited to skin breakdown. There is no tunneling or undermining noted. There is a medium amount of serosanguineous drainage noted. The wound margin is flat and intact. There is large (67-100%) red granulation within the wound bed. There is no necrotic tissue within the wound bed. The periwound skin appearance exhibited: Scarring. The periwound skin appearance did not exhibit: Callus, Crepitus, Excoriation, Induration, Rash, Dry/Scaly, Maceration, Atrophie Blanche, Cyanosis, Ecchymosis, Hemosiderin Staining, Mottled, Pallor, Rubor, Erythema. Periwound temperature was noted as No Abnormality. Wound #2 status is Open. Original cause of wound was Gradually Appeared. The wound is located on the Left Abdomen - midline. The wound measures 5.7cm length x 0.5cm width x 0.1cm depth; 2.238cm^2 area and 0.224cm^3 volume. The wound is limited to skin breakdown. There is no tunneling or undermining noted. There is a large amount of purulent drainage noted. The wound margin is flat and intact. There is medium (34-66%) red granulation within the wound bed. There is a medium (34-66%) amount of necrotic tissue within the wound bed including Eschar and Adherent Slough. The periwound skin appearance exhibited: Induration, Scarring, Ecchymosis. The periwound skin appearance did not exhibit: Callus, Crepitus, Excoriation, Rash, Dry/Scaly, Maceration, Atrophie Blanche, Cyanosis, Hemosiderin Staining, Mottled, Pallor, Rubor, Erythema. Periwound temperature was noted as No  Abnormality. Assessment Active Problems ICD-10 E11.622 - Type 2 diabetes mellitus with other skin ulcer S31.100A - Unspecified open wound of abdominal wall, right upper quadrant without penetration into peritoneal cavity, initial encounter S31.102A - Unspecified open wound of abdominal wall, epigastric region without penetration into peritoneal cavity, initial encounter F17.228 -  Nicotine dependence, chewing tobacco, with other nicotine-induced disorders [name], [name] ([id number]) Plan Wound Cleansing: Wound #1 Right Abdomen - midline: Clean wound with Normal Saline. May Shower, gently pat wound dry prior to applying new dressing. Wound #2 Left Abdomen - midline: Clean wound with Normal Saline. May Shower, gently pat wound dry prior to applying new dressing. Anesthetic: Wound #1 Right Abdomen - midline: Topical Lidocaine 4% cream applied to wound bed prior to debridement Wound #2 Left Abdomen - midline: Topical Lidocaine 4% cream applied to wound bed prior to debridement Skin Barriers/Peri-Wound Care: Wound #1 Right Abdomen - midline: Skin Prep Wound #2 Left Abdomen - midline: Skin Prep Primary Wound Dressing: Wound #1 Right Abdomen - midline: Aquacel Ag Wound #2 Left Abdomen - midline: Aquacel Ag Secondary Dressing: Wound #1 Right Abdomen - midline: Other - [place] Wound #2 Left Abdomen - midline: Other - [place] Dressing Change Frequency: Wound #1 Right Abdomen - midline: Change dressing every other day. Wound #2 Left Abdomen - midline: Change dressing every other day. Follow-up Appointments: Wound #1 Right Abdomen - midline: Return Appointment in 2 Jackson. Wound #2 Left Abdomen - midline: Return Appointment in 2 Jackson. Additional Orders / Instructions: Wound #1 Right Abdomen - midline: Other: - Go see a Engineer, drilling about your hernias and get a surgical opinion. Wound #2 Left Abdomen - midline: Other: - Go see a Engineer, drilling about your hernias and get a  surgical opinion. General Notes: We will continue with the Current wound care measures for the next two Jackson. We will see [name], [name] ([id number]) him for reevaluation at that point. If anything worsens in the interim he will contact our office for additional recommendations. Please see wound care orders above. Electronic Signature(s) Signed: 10/16/2016 4:13:08 PM By: Michael Keeler PA-C Entered By: [name] on 10/16/2016 10:00:11 [name] ([id number]) -------------------------------------------------------------------------------- SuperBill Details Patient Name: [name] Date of Service: 10/16/2016 Medical Record Number: [id number] Patient Account Number: [id number] Date of Birth/Sex: Dec 06, 1945 (71 y.o. Male) Treating RN: [name] Primary Care Provider: Thomes Jackson Other Clinician: Referring Provider: Thomes Jackson Treating Provider/Extender: [name] in Treatment: 15 Diagnosis Coding ICD-10 Codes Code Description E11.622 Type 2 diabetes mellitus with other skin ulcer Unspecified open wound of abdominal wall, right upper quadrant without penetration into S31.100A peritoneal cavity, initial encounter Unspecified open wound of abdominal wall, epigastric region without penetration into S31.102A peritoneal cavity, initial encounter F17.228 Nicotine dependence, chewing tobacco, with other nicotine-induced disorders Facility Procedures CPT4 Code: 70786754 Description: 99213 - WOUND CARE VISIT-LEV 3 EST PT Modifier: Quantity: 1 Physician Procedures CPT4: Description Modifier Quantity Code [id number] 99213 - WC PHYS LEVEL 3 - EST PT 1 ICD-10 Description Diagnosis E11.622 Type 2 diabetes mellitus with other skin ulcer S31.100A Unspecified open wound of abdominal wall, right upper quadrant without  penetration into peritoneal cavity, initial encounter S31.102A Unspecified open wound of abdominal wall, epigastric region without penetration into  peritoneal cavity, initial encounter F17.228 Nicotine dependence, chewing tobacco, with other  nicotine-induced disorders Electronic Signature(s) Signed: 10/16/2016 4:13:08 PM By: Michael Keeler PA-C Entered By: [name] on 10/16/2016 10:00:36

## 2016-10-26 MED ORDER — OXYCODONE-ACETAMINOPHEN 5 MG-325 MG TABLET
ORAL_TABLET | Freq: Four times a day (QID) | ORAL | 0 refills | 0.00000 days | Status: CP | PRN
Start: 2016-10-26 — End: 2016-11-25

## 2016-10-27 MED ORDER — TAMSULOSIN 0.4 MG CAPSULE
ORAL_CAPSULE | 3 refills | 0 days | Status: CP
Start: 2016-10-27 — End: 2017-11-20

## 2016-10-30 ENCOUNTER — Encounter: Payer: Medicare Other | Admitting: Surgery

## 2016-10-30 DIAGNOSIS — E11622 Type 2 diabetes mellitus with other skin ulcer: Secondary | ICD-10-CM | POA: Diagnosis not present

## 2016-10-31 ENCOUNTER — Ambulatory Visit: Admission: RE | Admit: 2016-10-31 | Discharge: 2016-10-31 | Payer: MEDICARE | Attending: Surgery

## 2016-10-31 DIAGNOSIS — K432 Incisional hernia without obstruction or gangrene: Principal | ICD-10-CM

## 2016-10-31 NOTE — Progress Notes (Signed)
BERTRAM, HADDIX ([id number]) Visit Report for 10/30/2016 Chief Complaint Document Details Patient Name: [name], [name]. Date of Service: 10/30/2016 9:15 AM Medical Record Number: [id number] Patient Account Number: [id number] Date of Birth/Sex: 07-13-1945 (71 y.o. Male) Treating RN: Montey Hora Primary Care Provider: Thomes Lolling Other Clinician: Referring Provider: Thomes Lolling Treating Provider/Extender: Frann Rider in Treatment: 17 Information Obtained from: Patient Chief Complaint Patients presents for treatment of an open diabetic ulcer to the anterior abdominal wall in the epigastric and right upper quadrant Electronic Signature(s) Signed: 10/30/2016 3:16:27 PM By: Christin Fudge MD, FACS Entered By: Christin Fudge on 10/30/2016 09:28:59 [name] ([id number]) -------------------------------------------------------------------------------- HPI Details Patient Name: [name] Date of Service: 10/30/2016 9:15 AM Medical Record Number: [id number] Patient Account Number: [id number] Date of Birth/Sex: [birth year] (71 y.o. Male) Treating RN: Montey Hora Primary Care Provider: Thomes Lolling Other Clinician: Referring Provider: Thomes Lolling Treating Provider/Extender: Frann Rider in Treatment: 17 History of Present Illness Location: abdomen wall wounds,-- right upper quadrant and epigastric region Quality: Patient reports experiencing a dull pain to affected area(s). Severity: Patient states wound are getting worse. Duration: Patient has had the wound for >5 year's prior to seeking treatment at the wound center Timing: Pain in wound is Intermittent (comes and goes Context: The wound appeared gradually over time Modifying Factors: Consults to this date include:not seen a surgeon and only sees his PCP for his medical complaints Associated Signs and Symptoms: Patient reports having increase discharge. HPI Description: 71 year old gentleman has been seen  recently by his PCP Dr. Thomes Lolling, who sees him with a history of diabetes mellitus, peripheral neuropathy, B12 deficiency, hypertension, osteoarthritis and the patient had recent blood sugar checked which was 163. Past medical history includes essential hypertension, incisional hernia, diabetes mellitus, colon polyps, COPD, tobacco abuse in the past, chewing tobacco, GERD. He has also been treated for chronic back pain with no sciatica and is on oxycodone. His last hemoglobin A1c was 6.9% His abdominal surgery was done at [facility] over 15 years ago and gradually there was sutures which protruded and then there was mesh which protruded. He washes this during his shower but does not put any dressing and the wound is covered with a lot of debris. He has never had a surgical consultation for this. 07/10/2016 -- the patient has spoken to his PCP who said he was going to call me but I have not yet had a phone conversation with the physician. From what I understand the physician was reluctant to have a surgical opinion because of the patient's several comorbidities and he feels that the surgery may be detrimental to his overall health 07/24/2016 -- the patient's daughter is at the bedside and we've had a detailed discussion regarding his options for a surgical opinion and possible surgical intervention before this becomes a emergent situation. She will seek primary care input and take the father to [facility] for a surgical opinion. 07/31/2016 -- the patient's daughter has organized for a surgical opinion at [facility] on June 14 08/14/2016 -- it has been about 3 weeks since he has stopped chewing tobacco. 08/28/2016 -- he was seen at [facility] by Dr. Roby Lofts -- the assessment was based on physical examination and review of his previous history of surgical repair. the patient had undergone a ventral hernia repair in 1998 with Dr. Quay Burow at [facility] at a prior open  cholecystectomy incision. he was evaluated by Dr. Denice Paradise in 2013 and at that  time it was decided to watch and wait. Dr. Payton Doughty recommended a repeat CT scan and requested records from dura and regional operative records and would follow-up the patient, after Mr. [place] PCP Dr. Gwenlyn Saran reviewed him for overall fitness for surgery. 09/04/16 patient's wound actually appear to be doing well on evaluation today. He is not really experience [name], [name]. ([id number]) any discomfort and the right abdominal wound is dry and almost appears to be healing up. He continues to have some drainage from the left but this seems to be more of a potential seroma which with milking is able to be cleared out. I discussed with patient that when he performs his dressing changes at home it would be beneficial for him to do this as well. Otherwise there's no evidence of infection. He is still waiting on his CT scan and appointments to determine whether he is going to have another surgery. 09/11/2016 -- the patient has had a CT scan and is awaiting his surgical appointment prior to deciding on any surgical intervention. 09/25/16 patient presents today for fault evaluation concerning his ongoing abdominal surgical wounds. Unfortunately they do not appear to be improving significantly but rather fluctuate from a little better to little worse week by week. Obviously this is somewhat frustrating for him. 10/02/2016 -- the patient has completed his CT scan, and has a review by the surgeons at the end of August. He has a new area which has opened up a little below his left abdominal wound. 10/16/16 on evaluation today patient's wounds on the abdominal region appears to be doing about the same. He actually has an appointment with his surgeon on the 21st for evaluation regarding the required products appointment. Obviously he is having a difficult time with the feeling of these wounds. Fortunately there does not appear to be any  evidence of local infection such as significant amount of erythema or streaking. She also has an audio, vomiting, or diarrhea. He is not have any discomfort at this point. 10/30/2016 -- the surgical review is tomorrow and I am awaiting the input of the surgeon. Electronic Signature(s) Signed: 10/30/2016 3:16:27 PM By: Christin Fudge MD, FACS Entered By: Christin Fudge on 10/30/2016 34:19:37 [name] ([id number]) -------------------------------------------------------------------------------- Physical Exam Details Patient Name: [name], [name] Date of Service: 10/30/2016 9:15 AM Medical Record Number: [id number] Patient Account Number: [id number] Date of Birth/Sex: 19-Apr-1945 (71 y.o. Male) Treating RN: Montey Hora Primary Care Provider: Thomes Lolling Other Clinician: Referring Provider: Thomes Lolling Treating Provider/Extender: Frann Rider in Treatment: 17 Constitutional . Pulse regular. Respirations normal and unlabored. Afebrile. . Eyes Nonicteric. Reactive to light. Ears, Nose, Mouth, and Throat Lips, teeth, and gums WNL.[name] Moist mucosa without lesions. Neck supple and nontender. No palpable supraclavicular or cervical adenopathy. Normal sized without goiter. Respiratory WNL. No retractions.. Cardiovascular Pedal Pulses WNL. No clubbing, cyanosis or edema. Lymphatic No adneopathy. No adenopathy. No adenopathy. Musculoskeletal Adexa without tenderness or enlargement.. Digits and nails w/o clubbing, cyanosis, infection, petechiae, ischemia, or inflammatory conditions.. Integumentary (Hair, Skin) No suspicious lesions. No crepitus or fluctuance. No peri-wound warmth or erythema. No masses.[name] Psychiatric Judgement and insight Intact.. No evidence of depression, anxiety, or agitation.. Notes the wound continues to have several open areas with protruding mesh and granulomas. No sharp debridement was done today. Electronic Signature(s) Signed: 10/30/2016 3:16:27 PM  By: Christin Fudge MD, FACS Entered By: Christin Fudge on 10/30/2016 09:29:58 [name] ([id number]) -------------------------------------------------------------------------------- Physician Orders Details Patient Name: [name], [name] Date of Service:  10/30/2016 9:15 AM Medical Record Number: [id number] Patient Account Number: [id number] Date of Birth/Sex: May 09, 1945 (71 y.o. Male) Treating RN: Montey Hora Primary Care Provider: Thomes Lolling Other Clinician: Referring Provider: Thomes Lolling Treating Provider/Extender: Frann Rider in Treatment: 62 Verbal / Phone Orders: No Diagnosis Coding Wound Cleansing Wound #1 Right Abdomen - midline o Clean wound with Normal Saline. o May Shower, gently pat wound dry prior to applying new dressing. Wound #2 Left Abdomen - midline o Clean wound with Normal Saline. o May Shower, gently pat wound dry prior to applying new dressing. Anesthetic Wound #1 Right Abdomen - midline o Topical Lidocaine 4% cream applied to wound bed prior to debridement Wound #2 Left Abdomen - midline o Topical Lidocaine 4% cream applied to wound bed prior to debridement Skin Barriers/Peri-Wound Care Wound #1 Right Abdomen - midline o Skin Prep Wound #2 Left Abdomen - midline o Skin Prep Primary Wound Dressing Wound #1 Right Abdomen - midline o Aquacel Ag Wound #2 Left Abdomen - midline o Aquacel Ag Secondary Dressing Wound #1 Right Abdomen - midline o Other - telfa [place] Wound #2 Left Abdomen - midline o Other - telfa [address], [place] O. ([id number]) Dressing Change Frequency Wound #1 Right Abdomen - midline o Change dressing every other day. Wound #2 Left Abdomen - midline o Change dressing every other day. Follow-up Appointments Wound #1 Right Abdomen - midline o Return Appointment in 2 weeks. Wound #2 Left Abdomen - midline o Return Appointment in 2 weeks. Additional Orders / Instructions Wound #1  Right Abdomen - midline o Other: - Go see a Engineer, drilling about your hernias and get a surgical opinion. Wound #2 Left Abdomen - midline o Other: - Go see a Engineer, drilling about your hernias and get a surgical opinion. Electronic Signature(s) Signed: 10/30/2016 3:16:27 PM By: Christin Fudge MD, FACS Signed: 10/30/2016 4:32:30 PM By: Montey Hora Entered By: Montey Hora on 10/30/2016 09:25:26 [name] ([id number]) -------------------------------------------------------------------------------- Problem List Details Patient Name: [name], [name] Date of Service: 10/30/2016 9:15 AM Medical Record Number: [id number] Patient Account Number: [id number] Date of Birth/Sex: 1945-09-22 (71 y.o. Male) Treating RN: Montey Hora Primary Care Provider: Thomes Lolling Other Clinician: Referring Provider: Thomes Lolling Treating Provider/Extender: Frann Rider in Treatment: 17 Active Problems ICD-10 Encounter Code Description Active Date Diagnosis E11.622 Type 2 diabetes mellitus with other skin ulcer 07/03/2016 Yes S31.100A Unspecified open wound of abdominal wall, right upper 07/03/2016 Yes quadrant without penetration into peritoneal cavity, initial encounter S31.102A Unspecified open wound of abdominal wall, epigastric 07/03/2016 Yes region without penetration into peritoneal cavity, initial encounter F17.228 Nicotine dependence, chewing tobacco, with other 07/03/2016 Yes nicotine-induced disorders Inactive Problems Resolved Problems Electronic Signature(s) Signed: 10/30/2016 3:16:27 PM By: Christin Fudge MD, FACS Entered By: Christin Fudge on 10/30/2016 09:28:47 [name] ([id number]) -------------------------------------------------------------------------------- Progress Note Details Patient Name: [name] Date of Service: 10/30/2016 9:15 AM Medical Record Number: [id number] Patient Account Number: [id number] Date of Birth/Sex: October 27, 1945 (71 y.o. Male) Treating  RN: Montey Hora Primary Care Provider: Thomes Lolling Other Clinician: Referring Provider: Thomes Lolling Treating Provider/Extender: Frann Rider in Treatment: 17 Subjective Chief Complaint Information obtained from Patient Patients presents for treatment of an open diabetic ulcer to the anterior abdominal wall in the epigastric and right upper quadrant History of Present Illness (HPI) The following HPI elements were documented for the patient's wound: Location: abdomen wall wounds,-- right upper quadrant and epigastric region Quality: Patient reports experiencing a dull pain to affected area(s).  Severity: Patient states wound are getting worse. Duration: Patient has had the wound for >5 year's prior to seeking treatment at the wound center Timing: Pain in wound is Intermittent (comes and goes Context: The wound appeared gradually over time Modifying Factors: Consults to this date include:not seen a surgeon and only sees his PCP for his medical complaints Associated Signs and Symptoms: Patient reports having increase discharge. 71 year old gentleman has been seen recently by his PCP Dr. Thomes Lolling, who sees him with a history of diabetes mellitus, peripheral neuropathy, B12 deficiency, hypertension, osteoarthritis and the patient had recent blood sugar checked which was 163. Past medical history includes essential hypertension, incisional hernia, diabetes mellitus, colon polyps, COPD, tobacco abuse in the past, chewing tobacco, GERD. He has also been treated for chronic back pain with no sciatica and is on oxycodone. His last hemoglobin A1c was 6.9% His abdominal surgery was done at [facility] over 15 years ago and gradually there was sutures which protruded and then there was mesh which protruded. He washes this during his shower but does not put any dressing and the wound is covered with a lot of debris. He has never had a surgical consultation for this. 07/10/2016 --  the patient has spoken to his PCP who said he was going to call me but I have not yet had a phone conversation with the physician. From what I understand the physician was reluctant to have a surgical opinion because of the patient's several comorbidities and he feels that the surgery may be detrimental to his overall health 07/24/2016 -- the patient's daughter is at the bedside and we've had a detailed discussion regarding his options for a surgical opinion and possible surgical intervention before this becomes a emergent situation. She will seek primary care input and take the father to [facility] for a surgical opinion. 07/31/2016 -- the patient's daughter has organized for a surgical opinion at [facility] on June 14 08/14/2016 -- it has been about 3 weeks since he has stopped chewing tobacco. [name], [name] ([id number]) 08/28/2016 -- he was seen at [facility] by Dr. Roby Lofts -- the assessment was based on physical examination and review of his previous history of surgical repair. the patient had undergone a ventral hernia repair in 1998 with Dr. Quay Burow at [facility] at a prior open cholecystectomy incision. he was evaluated by Dr. Denice Paradise in 2013 and at that time it was decided to watch and wait. Dr. Payton Doughty recommended a repeat CT scan and requested records from dura and regional operative records and would follow-up the patient, after Mr. [place] PCP Dr. Gwenlyn Saran reviewed him for overall fitness for surgery. 09/04/16 patient's wound actually appear to be doing well on evaluation today. He is not really experience any discomfort and the right abdominal wound is dry and almost appears to be healing up. He continues to have some drainage from the left but this seems to be more of a potential seroma which with milking is able to be cleared out. I discussed with patient that when he performs his dressing changes at home it would be beneficial for him to do this as well. Otherwise  there's no evidence of infection. He is still waiting on his CT scan and appointments to determine whether he is going to have another surgery. 09/11/2016 -- the patient has had a CT scan and is awaiting his surgical appointment prior to deciding on any surgical intervention. 09/25/16 patient presents today for fault evaluation concerning his  ongoing abdominal surgical wounds. Unfortunately they do not appear to be improving significantly but rather fluctuate from a little better to little worse week by week. Obviously this is somewhat frustrating for him. 10/02/2016 -- the patient has completed his CT scan, and has a review by the surgeons at the end of August. He has a new area which has opened up a little below his left abdominal wound. 10/16/16 on evaluation today patient's wounds on the abdominal region appears to be doing about the same. He actually has an appointment with his surgeon on the 21st for evaluation regarding the required products appointment. Obviously he is having a difficult time with the feeling of these wounds. Fortunately there does not appear to be any evidence of local infection such as significant amount of erythema or streaking. She also has an audio, vomiting, or diarrhea. He is not have any discomfort at this point. 10/30/2016 -- the surgical review is tomorrow and I am awaiting the input of the surgeon. Objective Constitutional Pulse regular. Respirations normal and unlabored. Afebrile. Vitals Time Taken: 9:08 AM, Height: 65 in, Weight: 161 lbs, BMI: 26.8, Temperature: 98.3 F, Pulse: 73 bpm, Respiratory Rate: 18 breaths/min, Blood Pressure: 140/73 mmHg. [name], [name] ([id number]) Eyes Nonicteric. Reactive to light. Ears, Nose, Mouth, and Throat Lips, teeth, and gums WNL.[name] Moist mucosa without lesions. Neck supple and nontender. No palpable supraclavicular or cervical adenopathy. Normal sized without goiter. Respiratory WNL. No  retractions.. Cardiovascular Pedal Pulses WNL. No clubbing, cyanosis or edema. Lymphatic No adneopathy. No adenopathy. No adenopathy. Musculoskeletal Adexa without tenderness or enlargement.. Digits and nails w/o clubbing, cyanosis, infection, petechiae, ischemia, or inflammatory conditions.[name] Psychiatric Judgement and insight Intact.. No evidence of depression, anxiety, or agitation.. General Notes: the wound continues to have several open areas with protruding mesh and granulomas. No sharp debridement was done today. Integumentary (Hair, Skin) No suspicious lesions. No crepitus or fluctuance. No peri-wound warmth or erythema. No masses.. Wound #1 status is Open. Original cause of wound was Gradually Appeared. The wound is located on the Right Abdomen - midline. The wound measures 0.5cm length x 0.4cm width x 0.1cm depth; 0.157cm^2 area and 0.016cm^3 volume. The wound is limited to skin breakdown. There is no tunneling or undermining noted. There is a medium amount of serosanguineous drainage noted. The wound margin is flat and intact. There is no granulation within the wound bed. There is a large (67-100%) amount of necrotic tissue within the wound bed including Eschar. The periwound skin appearance exhibited: Scarring. The periwound skin appearance did not exhibit: Callus, Crepitus, Excoriation, Induration, Rash, Dry/Scaly, Maceration, Atrophie Blanche, Cyanosis, Ecchymosis, Hemosiderin Staining, Mottled, Pallor, Rubor, Erythema. Periwound temperature was noted as No Abnormality. Wound #2 status is Open. Original cause of wound was Gradually Appeared. The wound is located on the Left Abdomen - midline. The wound measures 5.3cm length x 0.9cm width x 0.1cm depth; 3.746cm^2 area and 0.375cm^3 volume. The wound is limited to skin breakdown. There is no tunneling or undermining noted. There is a large amount of purulent drainage noted. The wound margin is flat and intact. There is medium  (34-66%) red granulation within the wound bed. There is a medium (34-66%) amount of necrotic tissue within the wound bed including Eschar and Adherent Slough. The periwound skin appearance exhibited: Induration, Scarring, Ecchymosis. The periwound skin appearance did not exhibit: Callus, Crepitus, Excoriation, Rash, Dry/Scaly, Maceration, Atrophie Blanche, Cyanosis, Hemosiderin Staining, Domeier, Nguyen O. ([id number]) Mottled, Pallor, Rubor, Erythema. Periwound temperature was noted as No Abnormality. Assessment  Active Problems ICD-10 E11.622 - Type 2 diabetes mellitus with other skin ulcer S31.100A - Unspecified open wound of abdominal wall, right upper quadrant without penetration into peritoneal cavity, initial encounter S31.102A - Unspecified open wound of abdominal wall, epigastric region without penetration into peritoneal cavity, initial encounter F17.228 - Nicotine dependence, chewing tobacco, with other nicotine-induced disorders Plan Wound Cleansing: Wound #1 Right Abdomen - midline: Clean wound with Normal Saline. May Shower, gently pat wound dry prior to applying new dressing. Wound #2 Left Abdomen - midline: Clean wound with Normal Saline. May Shower, gently pat wound dry prior to applying new dressing. Anesthetic: Wound #1 Right Abdomen - midline: Topical Lidocaine 4% cream applied to wound bed prior to debridement Wound #2 Left Abdomen - midline: Topical Lidocaine 4% cream applied to wound bed prior to debridement Skin Barriers/Peri-Wound Care: Wound #1 Right Abdomen - midline: Skin Prep Wound #2 Left Abdomen - midline: Skin Prep Primary Wound Dressing: Wound #1 Right Abdomen - midline: Aquacel Ag Wound #2 Left Abdomen - midline: Aquacel Ag Secondary Dressing: Wound #1 Right Abdomen - midline: Other - telfa [place] Wound #2 Left Abdomen - midline: [name], [name] ([id number]) Other - telfa [place] Dressing Change Frequency: Wound #1 Right Abdomen -  midline: Change dressing every other day. Wound #2 Left Abdomen - midline: Change dressing every other day. Follow-up Appointments: Wound #1 Right Abdomen - midline: Return Appointment in 2 weeks. Wound #2 Left Abdomen - midline: Return Appointment in 2 weeks. Additional Orders / Instructions: Wound #1 Right Abdomen - midline: Other: - Go see a Engineer, drilling about your hernias and get a surgical opinion. Wound #2 Left Abdomen - midline: Other: - Go see a Engineer, drilling about your hernias and get a surgical opinion. the patient has had a CT scan and has a surgical opinion pending tomorrow. As explained to the patient, if surgery is not an option, this chronic wound will be difficult to heal, as there is foreign body in the subcutaneous tissue, which keeps extruding every now and then and causing a stitch granuloma. At the present time we will continue with silver alginate and bordered foam and I will be happy to talk to his surgeon if the need arises Electronic Signature(s) Signed: 10/30/2016 3:16:27 PM By: Christin Fudge MD, FACS Entered By: Christin Fudge on 10/30/2016 [place], [place] ([id number]) -------------------------------------------------------------------------------- Lester Details Patient Name: [name] Date of Service: 10/30/2016 Medical Record Number: [id number] Patient Account Number: [id number] Date of Birth/Sex: [birth year] (71 y.o. Male) Treating RN: Montey Hora Primary Care Provider: Thomes Lolling Other Clinician: Referring Provider: Thomes Lolling Treating Provider/Extender: Frann Rider in Treatment: 17 Diagnosis Coding ICD-10 Codes Code Description E11.622 Type 2 diabetes mellitus with other skin ulcer Unspecified open wound of abdominal wall, right upper quadrant without penetration into S31.100A peritoneal cavity, initial encounter Unspecified open wound of abdominal wall, epigastric region without penetration  into S31.102A peritoneal cavity, initial encounter F17.228 Nicotine dependence, chewing tobacco, with other nicotine-induced disorders Facility Procedures CPT4 Code: [id number] Description: 99213 - WOUND CARE VISIT-LEV 3 EST PT Modifier: Quantity: 1 Physician Procedures CPT4: Description Modifier Quantity Code [id number] 99213 - WC PHYS LEVEL 3 - EST PT 1 ICD-10 Description Diagnosis E11.622 Type 2 diabetes mellitus with other skin ulcer S31.100A Unspecified open wound of abdominal wall, right upper quadrant without  penetration into peritoneal cavity, initial encounter S31.102A Unspecified open wound of abdominal wall, epigastric region without penetration into peritoneal cavity, initial encounter F17.228 Nicotine dependence, chewing tobacco, with other  nicotine-induced disorders Electronic Signature(s) Signed: 10/30/2016 9:45:14 AM By: Montey Hora Signed: 10/30/2016 3:16:27 PM By: Christin Fudge MD, FACS Entered By: Montey Hora on 10/30/2016 09:45:14

## 2016-11-01 NOTE — Progress Notes (Signed)
GRIFF, BADLEY ([id number]) Visit Report for 10/30/2016 Arrival Information Details Patient Name: [name], [name]. Date of Service: 10/30/2016 9:15 AM Medical Record Number: [id number] Patient Account Number: [id number] Date of Birth/Sex: [birth year] (71 y.o. Male) Treating RN: Montey Hora Primary Care Gabriana Wilmott: Thomes Lolling Other Clinician: Referring Jonael Paradiso: Thomes Lolling Treating Philmore Lepore/Extender: Frann Rider in Treatment: 62 Visit Information History Since Last Visit Added or deleted any medications: No Patient Arrived: Ambulatory Any new allergies or adverse reactions: No Arrival Time: 09:07 Had a fall or experienced change in No Accompanied By: self activities of daily living that may affect Transfer Assistance: None risk of falls: Patient Identification Verified: Yes Signs or symptoms of abuse/neglect since last No Secondary Verification Process Yes visito Completed: Hospitalized since last visit: No Patient Requires Transmission-Based No Has Dressing in Place as Prescribed: Yes Precautions: Pain Present Now: No Patient Has Alerts: Yes Patient Alerts: DMII Electronic Signature(s) Signed: 10/30/2016 4:32:30 PM By: Montey Hora Entered By: Montey Hora on 10/30/2016 09:07:59 [name] ([id number]) -------------------------------------------------------------------------------- Clinic Level of Care Assessment Details Patient Name: [name] Date of Service: 10/30/2016 9:15 AM Medical Record Number: [id number] Patient Account Number: [id number] Date of Birth/Sex: 1946/01/09 (71 y.o. Male) Treating RN: Montey Hora Primary Care Charnel Giles: Thomes Lolling Other Clinician: Referring Octavia Velador: Thomes Lolling Treating [name] [name]/Extender: Frann Rider in Treatment: 17 Clinic Level of Care Assessment Items TOOL 4 Quantity Score []  - Use when only an EandM is performed on FOLLOW-UP visit 0 ASSESSMENTS - Nursing Assessment / Reassessment X -  Reassessment of Co-morbidities (includes updates in patient status) 1 10 X - Reassessment of Adherence to Treatment Plan 1 5 ASSESSMENTS - Wound and Skin Assessment / Reassessment []  - Simple Wound Assessment / Reassessment - one wound 0 X - Complex Wound Assessment / Reassessment - multiple wounds 2 5 []  - Dermatologic / Skin Assessment (not related to wound area) 0 ASSESSMENTS - Focused Assessment []  - Circumferential Edema Measurements - multi extremities 0 []  - Nutritional Assessment / Counseling / Intervention 0 []  - Lower Extremity Assessment (monofilament, tuning fork, pulses) 0 []  - Peripheral Arterial Disease Assessment (using hand held doppler) 0 ASSESSMENTS - Ostomy and/or Continence Assessment and Care []  - Incontinence Assessment and Management 0 []  - Ostomy Care Assessment and Management (repouching, etc.) 0 PROCESS - Coordination of Care X - Simple Patient / Family Education for ongoing care 1 15 []  - Complex (extensive) Patient / Family Education for ongoing care 0 []  - Staff obtains Programmer, systems, Records, Test Results / Process Orders 0 []  - Staff telephones HHA, Nursing Homes / Clarify orders / etc 0 []  - Routine Transfer to another Facility (non-emergent condition) 0 KEDAR, SEDANO ([id number]) []  - Routine Hospital Admission (non-emergent condition) 0 []  - New Admissions / Biomedical engineer / Ordering NPWT, Apligraf, etc. 0 []  - Emergency Hospital Admission (emergent condition) 0 X - Simple Discharge Coordination 1 10 []  - Complex (extensive) Discharge Coordination 0 PROCESS - Special Needs []  - Pediatric / Minor Patient Management 0 []  - Isolation Patient Management 0 []  - Hearing / Language / Visual special needs 0 []  - Assessment of Community assistance (transportation, D/C planning, etc.) 0 []  - Additional assistance / Altered mentation 0 []  - Support Surface(s) Assessment (bed, cushion, seat, etc.) 0 INTERVENTIONS - Wound Cleansing / Measurement []  -  Simple Wound Cleansing - one wound 0 X - Complex Wound Cleansing - multiple wounds 2 5 X - Wound Imaging (photographs - any number of wounds) 1 5 []  -  Wound Tracing (instead of photographs) 0 []  - Simple Wound Measurement - one wound 0 X - Complex Wound Measurement - multiple wounds 2 5 INTERVENTIONS - Wound Dressings X - Small Wound Dressing one or multiple wounds 2 10 []  - Medium Wound Dressing one or multiple wounds 0 []  - Large Wound Dressing one or multiple wounds 0 []  - Application of Medications - topical 0 []  - Application of Medications - injection 0 INTERVENTIONS - Miscellaneous []  - External ear exam 0 GRADYN, SHEIN ([id number]) []  - Specimen Collection (cultures, biopsies, blood, body fluids, etc.) 0 []  - Specimen(s) / Culture(s) sent or taken to Lab for analysis 0 []  - Patient Transfer (multiple staff / Harrel Lemon Lift / Similar devices) 0 []  - Simple Staple / Suture removal (25 or less) 0 []  - Complex Staple / Suture removal (26 or more) 0 []  - Hypo / Hyperglycemic Management (close monitor of Blood Glucose) 0 []  - Ankle / Brachial Index (ABI) - do not check if billed separately 0 X - Vital Signs 1 5 Has the patient been seen at the hospital within the last three years: Yes Total Score: 100 Level Of Care: New/Established - Level 3 Electronic Signature(s) Signed: 10/30/2016 4:32:30 PM By: Montey Hora Entered By: Montey Hora on 10/30/2016 09:45:05 [name] ([id number]) -------------------------------------------------------------------------------- Encounter Discharge Information Details Patient Name: [name] Date of Service: 10/30/2016 9:15 AM Medical Record Number: [id number] Patient Account Number: [id number] Date of Birth/Sex: 11-11-1945 (71 y.o. Male) Treating RN: Montey Hora Primary Care Roslynn Holte: Thomes Lolling Other Clinician: Referring Artemisa Sladek: Thomes Lolling Treating Roland Prine/Extender: Frann Rider in Treatment: 81 Encounter  Discharge Information Items Discharge Pain Level: 0 Discharge Condition: Stable Ambulatory Status: Ambulatory Discharge Destination: Home Transportation: Private Auto Accompanied By: self Schedule Follow-up Appointment: Yes Medication Reconciliation completed and provided to Patient/Care No Janett Kamath: Provided on Clinical Summary of Care: 10/30/2016 Form Type Recipient Paper Patient FB Electronic Signature(s) Signed: 10/31/2016 2:51:10 PM By: [name] Entered By: [name] on 10/30/2016 09:29:19 [name] ([id number]) -------------------------------------------------------------------------------- Multi Wound Chart Details Patient Name: [name] Date of Service: 10/30/2016 9:15 AM Medical Record Number: [id number] Patient Account Number: [id number] Date of Birth/Sex: Feb 18, 1946 (71 y.o. Male) Treating RN: Montey Hora Primary Care Trueman Worlds: Thomes Lolling Other Clinician: Referring Neiko Trivedi: Thomes Lolling Treating Hamp Moreland/Extender: Frann Rider in Treatment: 17 Vital Signs Height(in): 65 Pulse(bpm): 73 Weight(lbs): 161 Blood Pressure 140/73 (mmHg): Body Mass Index(BMI): 27 Temperature(F): 98.3 Respiratory Rate 18 (breaths/min): Photos: [N/A:N/A] Wound Location: Right Abdomen - midline Left Abdomen - midline N/A Wounding Event: Gradually Appeared Gradually Appeared N/A Primary Etiology: Atypical Atypical N/A Comorbid History: Cataracts, Chronic Cataracts, Chronic N/A Obstructive Pulmonary Obstructive Pulmonary Disease (COPD), Disease (COPD), Hypertension, Type II Hypertension, Type II Diabetes, Osteoarthritis, Diabetes, Osteoarthritis, Neuropathy Neuropathy Date Acquired: 05/17/2015 05/17/2015 N/A Weeks of Treatment: 17 17 N/A Wound Status: Open Open N/A Clustered Wound: No Yes N/A Clustered Quantity: N/A 3 N/A Measurements L x W x D 0.5x0.4x0.1 5.3x0.9x0.1 N/A (cm) Area (cm) : 0.157 3.746 N/A Volume (cm) : 0.016 0.375 N/A %  Reduction in Area: 95.40% 29.00% N/A % Reduction in Volume: 95.30% 29.00% N/A Classification: Full Thickness Without Full Thickness Without N/A Exposed Support Exposed Support Structures Structures HILBERT, BRIGGS ([id number]) Exudate Amount: Medium Large N/A Exudate Type: Serosanguineous Purulent N/A Exudate Color: red, Mansel yellow, [place], green N/A Wound Margin: Flat and Intact Flat and Intact N/A Granulation Amount: None Present (0%) Medium (34-66%) N/A Granulation Quality: N/A Red N/A Necrotic Amount:  Large (67-100%) Medium (34-66%) N/A Necrotic Tissue: Eschar Eschar, Adherent Slough N/A Exposed Structures: Fascia: No Fascia: No N/A Fat Layer (Subcutaneous Fat Layer (Subcutaneous Tissue) Exposed: No Tissue) Exposed: No Tendon: No Tendon: No Muscle: No Muscle: No Joint: No Joint: No Bone: No Bone: No Limited to Skin Limited to Skin Breakdown Breakdown Epithelialization: Medium (34-66%) Small (1-33%) N/A Periwound Skin Texture: Scarring: Yes Induration: Yes N/A Excoriation: No Scarring: Yes Induration: No Excoriation: No Callus: No Callus: No Crepitus: No Crepitus: No Rash: No Rash: No Periwound Skin Maceration: No Maceration: No N/A Moisture: Dry/Scaly: No Dry/Scaly: No Periwound Skin Color: Atrophie Blanche: No Ecchymosis: Yes N/A Cyanosis: No Atrophie Blanche: No Ecchymosis: No Cyanosis: No Erythema: No Erythema: No Hemosiderin Staining: No Hemosiderin Staining: No Mottled: No Mottled: No Pallor: No Pallor: No Rubor: No Rubor: No Temperature: No Abnormality No Abnormality N/A Tenderness on No No N/A Palpation: Wound Preparation: Ulcer Cleansing: Ulcer Cleansing: N/A Rinsed/Irrigated with Rinsed/Irrigated with Saline Saline Topical Anesthetic Topical Anesthetic Applied: Other: lidocaine Applied: Other: lidocaine 4% 4% Treatment Notes Electronic Signature(s) Signed: 10/30/2016 3:16:27 PM By: Christin Fudge MD, FACS SAVAS, ELVIN  ([id number]) Entered By: Christin Fudge on 10/30/2016 09:28:52 [name] ([id number]) -------------------------------------------------------------------------------- [place] Details Patient Name: MISTER, KRAHENBUHL Date of Service: 10/30/2016 9:15 AM Medical Record Number: [id number] Patient Account Number: [id number] Date of Birth/Sex: [birth year] (71 y.o. Male) Treating RN: Montey Hora Primary Care Clarkson Rosselli: Thomes Lolling Other Clinician: Referring Ayonna Speranza: Thomes Lolling Treating Caitlynn Ju/Extender: Frann Rider in Treatment: 64 Active Inactive ` Abuse / Safety / Falls / Self Care Management Nursing Diagnoses: Impaired physical mobility Goals: Patient will remain injury free Date Initiated: 07/03/2016 Target Resolution Date: 09/14/2016 Goal Status: Active Interventions: Assess fall risk on admission and as needed Notes: ` Orientation to the Wound Care Program Nursing Diagnoses: Knowledge deficit related to the wound healing center program Goals: Patient/caregiver will verbalize understanding of the [place] Program Date Initiated: 07/03/2016 Target Resolution Date: 09/15/2016 Goal Status: Active Interventions: Provide education on orientation to the wound center Notes: ` Wound/Skin Impairment Nursing Diagnoses: Impaired tissue integrity LOXLEY, SCHMALE ([id number]) Goals: Patient/caregiver will verbalize understanding of skin care regimen Date Initiated: 07/03/2016 Target Resolution Date: 09/15/2016 Goal Status: Active Ulcer/skin breakdown will have a volume reduction of 30% by week 4 Date Initiated: 07/03/2016 Target Resolution Date: 09/15/2016 Goal Status: Active Ulcer/skin breakdown will have a volume reduction of 50% by week 8 Date Initiated: 07/03/2016 Target Resolution Date: 09/15/2016 Goal Status: Active Ulcer/skin breakdown will have a volume reduction of 80% by week 12 Date Initiated: 07/03/2016 Target Resolution Date:  09/15/2016 Goal Status: Active Ulcer/skin breakdown will heal within 14 weeks Date Initiated: 07/03/2016 Target Resolution Date: 09/15/2016 Goal Status: Active Interventions: Assess patient/caregiver ability to obtain necessary supplies Assess patient/caregiver ability to perform ulcer/skin care regimen upon admission and as needed Assess ulceration(s) every visit Notes: Electronic Signature(s) Signed: 10/30/2016 4:32:30 PM By: Montey Hora Entered By: Montey Hora on 10/30/2016 09:24:06 [name] ([id number]) -------------------------------------------------------------------------------- Pain Assessment Details Patient Name: [name] Date of Service: 10/30/2016 9:15 AM Medical Record Number: [id number] Patient Account Number: [id number] Date of Birth/Sex: 1945-10-22 (71 y.o. Male) Treating RN: Montey Hora Primary Care Latyra Jaye: Thomes Lolling Other Clinician: Referring Tullio Chausse: Thomes Lolling Treating Garan Frappier/Extender: Frann Rider in Treatment: 17 Active Problems Location of Pain Severity and Description of Pain Patient Has Paino No Site Locations Pain Management and Medication Current Pain Management: Notes Topical or injectable lidocaine is offered to patient for  acute pain when surgical debridement is performed. If needed, Patient is instructed to use over the counter pain medication for the following 24-48 hours after debridement. Wound care MDs do not prescribed pain medications. Patient has chronic pain or uncontrolled pain. Patient has been instructed to make an appointment with their Primary Care Physician for pain management. Electronic Signature(s) Signed: 10/30/2016 4:32:30 PM By: Montey Hora Entered By: Montey Hora on 10/30/2016 09:08:06 [name] ([id number]) -------------------------------------------------------------------------------- Patient/Caregiver Education Details Patient Name: AVORY, MIMBS Date of Service:  10/30/2016 9:15 AM Medical Record Number: [id number] Patient Account Number: [id number] Date of Birth/Gender: 1945/05/18 (71 y.o. Male) Treating RN: Montey Hora Primary Care Physician: Thomes Lolling Other Clinician: Referring Physician: Thomes Lolling Treating Physician/Extender: Frann Rider in Treatment: 17 Education Assessment Education Provided To: Patient Education Topics Provided Wound/Skin Impairment: Handouts: Other: wound care as ordered Methods: Demonstration, Explain/Verbal Responses: State content correctly Electronic Signature(s) Signed: 10/30/2016 4:32:30 PM By: Montey Hora Entered By: Montey Hora on 10/30/2016 09:25:01 [name] ([id number]) -------------------------------------------------------------------------------- Wound Assessment Details Patient Name: [name] Date of Service: 10/30/2016 9:15 AM Medical Record Number: [id number] Patient Account Number: [id number] Date of Birth/Sex: 04-10-1945 (71 y.o. Male) Treating RN: Montey Hora Primary Care Mykeal Carrick: Thomes Lolling Other Clinician: Referring Jeriyah Granlund: Thomes Lolling Treating Chananya Canizalez/Extender: Frann Rider in Treatment: 17 Wound Status Wound Number: 1 Primary Atypical Etiology: Wound Location: Right Abdomen - midline Wound Open Wounding Event: Gradually Appeared Status: Date Acquired: 05/17/2015 Comorbid Cataracts, Chronic Obstructive Weeks Of Treatment: 17 History: Pulmonary Disease (COPD), Clustered Wound: No Hypertension, Type II Diabetes, Osteoarthritis, Neuropathy Photos Photo Uploaded By: Montey Hora on 10/30/2016 09:15:43 Wound Measurements Length: (cm) 0.5 Width: (cm) 0.4 Depth: (cm) 0.1 Area: (cm) 0.157 Volume: (cm) 0.016 % Reduction in Area: 95.4% % Reduction in Volume: 95.3% Epithelialization: Medium (34-66%) Tunneling: No Undermining: No Wound Description Full Thickness Without Exposed Classification: Support Structures Wound  Margin: Flat and Intact Exudate Medium Amount: Exudate Type: Serosanguineous Exudate Color: red, Ciaravino Foul Odor After Cleansing: No Slough/Fibrino No Wound Bed Granulation Amount: None Present (0%) Exposed Structure RAMONE, GANDER ([id number]) Necrotic Amount: Large (67-100%) Fascia Exposed: No Necrotic Quality: Eschar Fat Layer (Subcutaneous Tissue) Exposed: No Tendon Exposed: No Muscle Exposed: No Joint Exposed: No Bone Exposed: No Limited to Skin Breakdown Periwound Skin Texture Texture Color No Abnormalities Noted: No No Abnormalities Noted: No Callus: No Atrophie Blanche: No Crepitus: No Cyanosis: No Excoriation: No Ecchymosis: No Induration: No Erythema: No Rash: No Hemosiderin Staining: No Scarring: Yes Mottled: No Pallor: No Moisture Rubor: No No Abnormalities Noted: No Dry / Scaly: No Temperature / Pain Maceration: No Temperature: No Abnormality Wound Preparation Ulcer Cleansing: Rinsed/Irrigated with Saline Topical Anesthetic Applied: Other: lidocaine 4%, Treatment Notes Wound #1 (Right Abdomen - midline) 1. Cleansed with: Clean wound with Normal Saline 2. Anesthetic Topical Lidocaine 4% cream to wound bed prior to debridement 3. Peri-wound Care: Skin Prep 4. Dressing Applied: Aquacel Ag 5. Secondary [place] Signature(s) Signed: 10/30/2016 4:32:30 PM By: Montey Hora Entered By: Montey Hora on 10/30/2016 09:14:14 [name] ([id number]) -------------------------------------------------------------------------------- Wound Assessment Details Patient Name: [name] Date of Service: 10/30/2016 9:15 AM Medical Record Number: [id number] Patient Account Number: [id number] Date of Birth/Sex: Dec 12, 1945 (71 y.o. Male) Treating RN: Montey Hora Primary Care Zyion Leidner: Thomes Lolling Other Clinician: Referring Demetrion Wesby: Thomes Lolling Treating Calen Posch/Extender: Frann Rider in Treatment:  17 Wound Status Wound Number: 2 Primary Atypical Etiology: Wound Location: Left Abdomen - midline Wound Open  Wounding Event: Gradually Appeared Status: Date Acquired: 05/17/2015 Comorbid Cataracts, Chronic Obstructive Weeks Of Treatment: 17 History: Pulmonary Disease (COPD), Clustered Wound: Yes Hypertension, Type II Diabetes, Osteoarthritis, Neuropathy Photos Photo Uploaded By: Montey Hora on 10/30/2016 09:15:44 Wound Measurements Length: (cm) 5.3 Width: (cm) 0.9 Depth: (cm) 0.1 Clustered Quantity: 3 Area: (cm) 3.746 Volume: (cm) 0.375 % Reduction in Area: 29% % Reduction in Volume: 29% Epithelialization: Small (1-33%) Tunneling: No Undermining: No Wound Description Full Thickness Without Exposed Foul Odor Afte Classification: Support Structures Slough/Fibrino Wound Margin: Flat and Intact Exudate Large Amount: Exudate Type: Purulent Exudate Color: yellow, [place], green r Cleansing: No No Wound Bed EVIN, LOISEAU ([id number]) Granulation Amount: Medium (34-66%) Exposed Structure Granulation Quality: Red Fascia Exposed: No Necrotic Amount: Medium (34-66%) Fat Layer (Subcutaneous Tissue) Exposed: No Necrotic Quality: Eschar, Adherent Slough Tendon Exposed: No Muscle Exposed: No Joint Exposed: No Bone Exposed: No Limited to Skin Breakdown Periwound Skin Texture Texture Color No Abnormalities Noted: No No Abnormalities Noted: No Callus: No Atrophie Blanche: No Crepitus: No Cyanosis: No Excoriation: No Ecchymosis: Yes Induration: Yes Erythema: No Rash: No Hemosiderin Staining: No Scarring: Yes Mottled: No Pallor: No Moisture Rubor: No No Abnormalities Noted: No Dry / Scaly: No Temperature / Pain Maceration: No Temperature: No Abnormality Wound Preparation Ulcer Cleansing: Rinsed/Irrigated with Saline Topical Anesthetic Applied: Other: lidocaine 4%, Treatment Notes Wound #2 (Left Abdomen - midline) 1. Cleansed with: Clean wound with  Normal Saline 2. Anesthetic Topical Lidocaine 4% cream to wound bed prior to debridement 3. Peri-wound Care: Skin Prep 4. Dressing Applied: Aquacel Ag 5. Secondary [place] Signature(s) Signed: 10/30/2016 4:32:30 PM By: Montey Hora Entered By: Montey Hora on 10/30/2016 09:14:26 [name] ([id number]) -------------------------------------------------------------------------------- [place] Details Patient Name: [name] Date of Service: 10/30/2016 9:15 AM Medical Record Number: [id number] Patient Account Number: [id number] Date of Birth/Sex: October 10, 1945 (71 y.o. Male) Treating RN: Montey Hora Primary Care Diesha Rostad: Thomes Lolling Other Clinician: Referring Chung Chagoya: Thomes Lolling Treating Yuval Nolet/Extender: Frann Rider in Treatment: 17 Vital Signs Time Taken: 09:08 Temperature (F): 98.3 Height (in): 65 Pulse (bpm): 73 Weight (lbs): 161 Respiratory Rate (breaths/min): 18 Body Mass Index (BMI): 26.8 Blood Pressure (mmHg): 140/73 Reference Range: 80 - 120 mg / dl Electronic Signature(s) Signed: 10/30/2016 4:32:30 PM By: Montey Hora Entered By: Montey Hora on 10/30/2016 11:03:15

## 2016-11-16 ENCOUNTER — Encounter: Payer: Medicare Other | Attending: Surgery | Admitting: Surgery

## 2016-11-16 DIAGNOSIS — S31102A Unspecified open wound of abdominal wall, epigastric region without penetration into peritoneal cavity, initial encounter: Secondary | ICD-10-CM | POA: Diagnosis not present

## 2016-11-16 DIAGNOSIS — Z7984 Long term (current) use of oral hypoglycemic drugs: Secondary | ICD-10-CM | POA: Diagnosis not present

## 2016-11-16 DIAGNOSIS — M549 Dorsalgia, unspecified: Secondary | ICD-10-CM | POA: Diagnosis not present

## 2016-11-16 DIAGNOSIS — S31100A Unspecified open wound of abdominal wall, right upper quadrant without penetration into peritoneal cavity, initial encounter: Secondary | ICD-10-CM | POA: Insufficient documentation

## 2016-11-16 DIAGNOSIS — M1990 Unspecified osteoarthritis, unspecified site: Secondary | ICD-10-CM | POA: Diagnosis not present

## 2016-11-16 DIAGNOSIS — G8929 Other chronic pain: Secondary | ICD-10-CM | POA: Insufficient documentation

## 2016-11-16 DIAGNOSIS — I10 Essential (primary) hypertension: Secondary | ICD-10-CM | POA: Insufficient documentation

## 2016-11-16 DIAGNOSIS — Z79899 Other long term (current) drug therapy: Secondary | ICD-10-CM | POA: Diagnosis not present

## 2016-11-16 DIAGNOSIS — X58XXXA Exposure to other specified factors, initial encounter: Secondary | ICD-10-CM | POA: Diagnosis not present

## 2016-11-16 DIAGNOSIS — J449 Chronic obstructive pulmonary disease, unspecified: Secondary | ICD-10-CM | POA: Diagnosis not present

## 2016-11-16 DIAGNOSIS — F17228 Nicotine dependence, chewing tobacco, with other nicotine-induced disorders: Secondary | ICD-10-CM | POA: Diagnosis not present

## 2016-11-16 DIAGNOSIS — E538 Deficiency of other specified B group vitamins: Secondary | ICD-10-CM | POA: Insufficient documentation

## 2016-11-16 DIAGNOSIS — Z79891 Long term (current) use of opiate analgesic: Secondary | ICD-10-CM | POA: Diagnosis not present

## 2016-11-16 DIAGNOSIS — K219 Gastro-esophageal reflux disease without esophagitis: Secondary | ICD-10-CM | POA: Insufficient documentation

## 2016-11-16 DIAGNOSIS — E11622 Type 2 diabetes mellitus with other skin ulcer: Secondary | ICD-10-CM | POA: Diagnosis not present

## 2016-11-16 DIAGNOSIS — E1142 Type 2 diabetes mellitus with diabetic polyneuropathy: Secondary | ICD-10-CM | POA: Insufficient documentation

## 2016-11-18 NOTE — Progress Notes (Signed)
[name], [name] ([id number]) Visit Report for 11/16/2016 Chief Complaint Document Details Patient Name: [name], [name]. Date of Service: 11/16/2016 9:15 AM Medical Record Number: [id number] Patient Account Number: [id number] Date of Birth/Sex: November 27, 1945 (71 y.o. Male) Treating RN: Montey Hora Primary Care Provider: Thomes Lolling Other Clinician: Referring Provider: Thomes Lolling Treating Provider/Extender: Frann Rider in Treatment: 33 Information Obtained from: Patient Chief Complaint Patients presents for treatment of an open diabetic ulcer to the anterior abdominal wall in the epigastric and right upper quadrant Electronic Signature(s) Signed: 11/16/2016 12:50:15 PM By: Christin Fudge MD, FACS Entered By: Christin Fudge on 11/16/2016 09:52:04 [name] ([id number]) -------------------------------------------------------------------------------- Debridement Details Patient Name: [name] Date of Service: 11/16/2016 9:15 AM Medical Record Number: [id number] Patient Account Number: [id number] Date of Birth/Sex: 08-Jan-1946 (71 y.o. Male) Treating RN: Montey Hora Primary Care Provider: Thomes Lolling Other Clinician: Referring Provider: Thomes Lolling Treating Provider/Extender: Frann Rider in Treatment: 19 Debridement Performed for Wound #2 Left Abdomen - midline Assessment: Performed By: Physician Christin Fudge, MD Debridement: Debridement Pre-procedure Verification/Time Out Yes - 09:45 Taken: Start Time: 09:45 Pain Control: Lidocaine 4% Topical Solution Level: Skin/Subcutaneous Tissue Total Area Debrided (L x 0.6 (cm) x 0.6 (cm) = 0.36 (cm) W): Tissue and other Viable, Non-Viable, Other, Subcutaneous material debrided: Instrument: Forceps, Scissors Bleeding: Minimum Hemostasis Achieved: Pressure End Time: 09:48 Procedural Pain: 0 Post Procedural Pain: 0 Response to Treatment: Procedure was tolerated well Post Debridement Measurements of  Total Wound Length: (cm) 5.3 Width: (cm) 1 Depth: (cm) 0.1 Volume: (cm) 0.416 Character of Wound/Ulcer Post Improved Debridement: Post Procedure Diagnosis Same as Pre-procedure Notes the wound on the left side had some Prolene suture and mesh protruding and I have sharply remove this from the subcutaneous region and bleeding controlled with pressure Electronic Signature(s) Signed: 11/16/2016 12:50:15 PM By: Christin Fudge MD, FACS Signed: 11/16/2016 4:41:02 PM By: Regan Lemming ([id number]) Entered By: Christin Fudge on 11/16/2016 09:51:55 [name] ([id number]) -------------------------------------------------------------------------------- HPI Details Patient Name: [name] Date of Service: 11/16/2016 9:15 AM Medical Record Number: [id number] Patient Account Number: [id number] Date of Birth/Sex: Apr 14, 1945 (71 y.o. Male) Treating RN: Montey Hora Primary Care Provider: Thomes Lolling Other Clinician: Referring Provider: Thomes Lolling Treating Provider/Extender: Frann Rider in Treatment: 19 History of Present Illness Location: abdomen wall wounds,-- right upper quadrant and epigastric region Quality: Patient reports experiencing a dull pain to affected area(s). Severity: Patient states wound are getting worse. Duration: Patient has had the wound for >5 year's prior to seeking treatment at the wound center Timing: Pain in wound is Intermittent (comes and goes Context: The wound appeared gradually over time Modifying Factors: Consults to this date include:not seen a surgeon and only sees his PCP for his medical complaints Associated Signs and Symptoms: Patient reports having increase discharge. HPI Description: 71 year old gentleman has been seen recently by his PCP Dr. Thomes Lolling, who sees him with a history of diabetes mellitus, peripheral neuropathy, B12 deficiency, hypertension, osteoarthritis and the patient had recent blood sugar checked  which was 163. Past medical history includes essential hypertension, incisional hernia, diabetes mellitus, colon polyps, COPD, tobacco abuse in the past, chewing tobacco, GERD. He has also been treated for chronic back pain with no sciatica and is on oxycodone. His last hemoglobin A1c was 6.9% His abdominal surgery was done at [facility] over 15 years ago and gradually there was sutures which protruded and then there was mesh which protruded. He washes this during his shower  but does not put any dressing and the wound is covered with a lot of debris. He has never had a surgical consultation for this. 07/10/2016 -- the patient has spoken to his PCP who said he was going to call me but I have not yet had a phone conversation with the physician. From what I understand the physician was reluctant to have a surgical opinion because of the patient's several comorbidities and he feels that the surgery may be detrimental to his overall health 07/24/2016 -- the patient's daughter is at the bedside and we've had a detailed discussion regarding his options for a surgical opinion and possible surgical intervention before this becomes a emergent situation. She will seek primary care input and take the father to [facility] for a surgical opinion. 07/31/2016 -- the patient's daughter has organized for a surgical opinion at [facility] on June 14 08/14/2016 -- it has been about 3 weeks since he has stopped chewing tobacco. 08/28/2016 -- he was seen at [facility] by Dr. Roby Lofts -- the assessment was based on physical examination and review of his previous history of surgical repair. the patient had undergone a ventral hernia repair in 1998 with Dr. Quay Burow at [facility] at a prior open cholecystectomy incision. he was evaluated by Dr. Denice Paradise in 2013 and at that time it was decided to watch and wait. Dr. Payton Doughty recommended a repeat CT scan and requested records from dura and regional  operative records and would follow-up the patient, after Mr. Ron PCP Dr. Gwenlyn Saran reviewed him for overall fitness for surgery. 09/04/16 patient's wound actually appear to be doing well on evaluation today. He is not really experience [name], [name]. ([id number]) any discomfort and the right abdominal wound is dry and almost appears to be healing up. He continues to have some drainage from the left but this seems to be more of a potential seroma which with milking is able to be cleared out. I discussed with patient that when he performs his dressing changes at home it would be beneficial for him to do this as well. Otherwise there's no evidence of infection. He is still waiting on his CT scan and appointments to determine whether he is going to have another surgery. 09/11/2016 -- the patient has had a CT scan and is awaiting his surgical appointment prior to deciding on any surgical intervention. 09/25/16 patient presents today for fault evaluation concerning his ongoing abdominal surgical wounds. Unfortunately they do not appear to be improving significantly but rather fluctuate from a little better to little worse week by week. Obviously this is somewhat frustrating for him. 10/02/2016 -- the patient has completed his CT scan, and has a review by the surgeons at the end of August. He has a new area which has opened up a little below his left abdominal wound. 10/16/16 on evaluation today patient's wounds on the abdominal region appears to be doing about the same. He actually has an appointment with his surgeon on the 21st for evaluation regarding the required products appointment. Obviously he is having a difficult time with the feeling of these wounds. Fortunately there does not appear to be any evidence of local infection such as significant amount of erythema or streaking. She also has an audio, vomiting, or diarrhea. He is not have any discomfort at this point. 10/30/2016 -- the surgical  review is tomorrow and I am awaiting the input of the surgeon. 11/16/2016 -- I had a call from his surgeon Dr.  Dreesen, on phone number [phone] who was kind enough to call me regarding his care. After a thorough review and having consulted the abdominal wall reconstruction team they have decided that surgery will be too extensive and rather moribund for this gentleman and have asked him to continue with local care at the wound center. She will send me an official note via Epic. Electronic Signature(s) Signed: 11/16/2016 12:50:15 PM By: Christin Fudge MD, FACS Entered By: Christin Fudge on 11/16/2016 09:52:11 [name] ([id number]) -------------------------------------------------------------------------------- Physical Exam Details Patient Name: [name], [name] Date of Service: 11/16/2016 9:15 AM Medical Record Number: [id number] Patient Account Number: [id number] Date of Birth/Sex: 1945/06/27 (71 y.o. Male) Treating RN: Montey Hora Primary Care Provider: Thomes Lolling Other Clinician: Referring Provider: Thomes Lolling Treating Provider/Extender: Frann Rider in Treatment: 19 Constitutional . Pulse regular. Respirations normal and unlabored. Afebrile. . Eyes Nonicteric. Reactive to light. Ears, Nose, Mouth, and Throat Lips, teeth, and gums WNL.[name] Moist mucosa without lesions. Neck supple and nontender. No palpable supraclavicular or cervical adenopathy. Normal sized without goiter. Respiratory WNL. No retractions.. Breath sounds WNL, No rubs, rales, rhonchi, or wheeze.. Cardiovascular Heart rhythm and rate regular, no murmur or gallop.. Pedal Pulses WNL. No clubbing, cyanosis or edema. Chest Breasts symmetical and no nipple discharge.. Breast tissue WNL, no masses, lumps, or tenderness.. Lymphatic No adneopathy. No adenopathy. No adenopathy. Musculoskeletal Adexa without tenderness or enlargement.. Digits and nails w/o clubbing, cyanosis, infection,  petechiae, ischemia, or inflammatory conditions.. Integumentary (Hair, Skin) No suspicious lesions. No crepitus or fluctuance. No peri-wound warmth or erythema. No masses.[name] Psychiatric Judgement and insight Intact.. No evidence of depression, anxiety, or agitation.. Notes the wound on the right upper quadrant is looking very good. In the area of the left peri-a medical wound he has at the inferior aspect of Prolene suture and some mesh protruding and using a forcep and scissors at sharply removed this with minimal bleeding controlled with pressure Electronic Signature(s) Signed: 11/16/2016 12:50:15 PM By: Christin Fudge MD, FACS Entered By: Christin Fudge on 11/16/2016 09:52:44 [name] ([id number]) -------------------------------------------------------------------------------- Physician Orders Details Patient Name: [name] Date of Service: 11/16/2016 9:15 AM Medical Record Number: [id number] Patient Account Number: [id number] Date of Birth/Sex: [birth year] (71 y.o. Male) Treating RN: Montey Hora Primary Care Provider: Thomes Lolling Other Clinician: Referring Provider: Thomes Lolling Treating Provider/Extender: Frann Rider in Treatment: 21 Verbal / Phone Orders: No Diagnosis Coding Wound Cleansing Wound #1 Right Abdomen - midline o Clean wound with Normal Saline. o May Shower, gently pat wound dry prior to applying new dressing. Wound #2 Left Abdomen - midline o Clean wound with Normal Saline. o May Shower, gently pat wound dry prior to applying new dressing. Anesthetic Wound #1 Right Abdomen - midline o Topical Lidocaine 4% cream applied to wound bed prior to debridement Wound #2 Left Abdomen - midline o Topical Lidocaine 4% cream applied to wound bed prior to debridement Skin Barriers/Peri-Wound Care Wound #1 Right Abdomen - midline o Skin Prep Wound #2 Left Abdomen - midline o Skin Prep Primary Wound Dressing Wound #1 Right Abdomen -  midline o Aquacel Ag Wound #2 Left Abdomen - midline o Aquacel Ag Secondary Dressing Wound #1 Right Abdomen - midline o Other - [place] Wound #2 Left Abdomen - midline o Other - telfa [address], Michael O. ([id number]) Dressing Change Frequency Wound #1 Right Abdomen - midline o Change dressing every other day. Wound #2 Left Abdomen - midline o Change dressing every other day.  Follow-up Appointments Wound #1 Right Abdomen - midline o Return Appointment in 2 weeks. Wound #2 Left Abdomen - midline o Return Appointment in 2 weeks. Additional Orders / Instructions Wound #1 Right Abdomen - midline o Other: - Go see a Engineer, drilling about your hernias and get a surgical opinion. Wound #2 Left Abdomen - midline o Other: - Go see a Engineer, drilling about your hernias and get a surgical opinion. Electronic Signature(s) Signed: 11/16/2016 12:50:15 PM By: Christin Fudge MD, FACS Signed: 11/16/2016 4:41:02 PM By: Montey Hora Entered By: Montey Hora on 11/16/2016 09:49:59 [name] ([id number]) -------------------------------------------------------------------------------- Problem List Details Patient Name: [name], [name] Date of Service: 11/16/2016 9:15 AM Medical Record Number: [id number] Patient Account Number: [id number] Date of Birth/Sex: [birth year] (71 y.o. Male) Treating RN: Montey Hora Primary Care Provider: Thomes Lolling Other Clinician: Referring Provider: Thomes Lolling Treating Provider/Extender: Frann Rider in Treatment: 25 Active Problems ICD-10 Encounter Code Description Active Date Diagnosis E11.622 Type 2 diabetes mellitus with other skin ulcer 07/03/2016 Yes S31.100A Unspecified open wound of abdominal wall, right upper 07/03/2016 Yes quadrant without penetration into peritoneal cavity, initial encounter S31.102A Unspecified open wound of abdominal wall, epigastric 07/03/2016 Yes region without penetration into peritoneal cavity,  initial encounter F17.228 Nicotine dependence, chewing tobacco, with other 07/03/2016 Yes nicotine-induced disorders Inactive Problems Resolved Problems Electronic Signature(s) Signed: 11/16/2016 12:50:15 PM By: Christin Fudge MD, FACS Entered By: Christin Fudge on 11/16/2016 09:51:06 [name] ([id number]) -------------------------------------------------------------------------------- Progress Note Details Patient Name: [name] Date of Service: 11/16/2016 9:15 AM Medical Record Number: [id number] Patient Account Number: [id number] Date of Birth/Sex: [birth year] (71 y.o. Male) Treating RN: Montey Hora Primary Care Provider: Thomes Lolling Other Clinician: Referring Provider: Thomes Lolling Treating Provider/Extender: Frann Rider in Treatment: 38 Subjective Chief Complaint Information obtained from Patient Patients presents for treatment of an open diabetic ulcer to the anterior abdominal wall in the epigastric and right upper quadrant History of Present Illness (HPI) The following HPI elements were documented for the patient's wound: Location: abdomen wall wounds,-- right upper quadrant and epigastric region Quality: Patient reports experiencing a dull pain to affected area(s). Severity: Patient states wound are getting worse. Duration: Patient has had the wound for >5 year's prior to seeking treatment at the wound center Timing: Pain in wound is Intermittent (comes and goes Context: The wound appeared gradually over time Modifying Factors: Consults to this date include:not seen a surgeon and only sees his PCP for his medical complaints Associated Signs and Symptoms: Patient reports having increase discharge. 71 year old gentleman has been seen recently by his PCP Dr. Thomes Lolling, who sees him with a history of diabetes mellitus, peripheral neuropathy, B12 deficiency, hypertension, osteoarthritis and the patient had recent blood sugar checked which was 163. Past  medical history includes essential hypertension, incisional hernia, diabetes mellitus, colon polyps, COPD, tobacco abuse in the past, chewing tobacco, GERD. He has also been treated for chronic back pain with no sciatica and is on oxycodone. His last hemoglobin A1c was 6.9% His abdominal surgery was done at [facility] over 15 years ago and gradually there was sutures which protruded and then there was mesh which protruded. He washes this during his shower but does not put any dressing and the wound is covered with a lot of debris. He has never had a surgical consultation for this. 07/10/2016 -- the patient has spoken to his PCP who said he was going to call me but I have not yet had a phone conversation  with the physician. From what I understand the physician was reluctant to have a surgical opinion because of the patient's several comorbidities and he feels that the surgery may be detrimental to his overall health 07/24/2016 -- the patient's daughter is at the bedside and we've had a detailed discussion regarding his options for a surgical opinion and possible surgical intervention before this becomes a emergent situation. She will seek primary care input and take the father to [facility] for a surgical opinion. 07/31/2016 -- the patient's daughter has organized for a surgical opinion at [facility] on June 14 08/14/2016 -- it has been about 3 weeks since he has stopped chewing tobacco. [name], [name] ([id number]) 08/28/2016 -- he was seen at [facility] by Dr. Roby Lofts -- the assessment was based on physical examination and review of his previous history of surgical repair. the patient had undergone a ventral hernia repair in 1998 with Dr. Quay Burow at [facility] at a prior open cholecystectomy incision. he was evaluated by Dr. Denice Paradise in 2013 and at that time it was decided to watch and wait. Dr. Payton Doughty recommended a repeat CT scan and requested records from dura and regional  operative records and would follow-up the patient, after Mr. Holness PCP Dr. Gwenlyn Saran reviewed him for overall fitness for surgery. 09/04/16 patient's wound actually appear to be doing well on evaluation today. He is not really experience any discomfort and the right abdominal wound is dry and almost appears to be healing up. He continues to have some drainage from the left but this seems to be more of a potential seroma which with milking is able to be cleared out. I discussed with patient that when he performs his dressing changes at home it would be beneficial for him to do this as well. Otherwise there's no evidence of infection. He is still waiting on his CT scan and appointments to determine whether he is going to have another surgery. 09/11/2016 -- the patient has had a CT scan and is awaiting his surgical appointment prior to deciding on any surgical intervention. 09/25/16 patient presents today for fault evaluation concerning his ongoing abdominal surgical wounds. Unfortunately they do not appear to be improving significantly but rather fluctuate from a little better to little worse week by week. Obviously this is somewhat frustrating for him. 10/02/2016 -- the patient has completed his CT scan, and has a review by the surgeons at the end of August. He has a new area which has opened up a little below his left abdominal wound. 10/16/16 on evaluation today patient's wounds on the abdominal region appears to be doing about the same. He actually has an appointment with his surgeon on the 21st for evaluation regarding the required products appointment. Obviously he is having a difficult time with the feeling of these wounds. Fortunately there does not appear to be any evidence of local infection such as significant amount of erythema or streaking. She also has an audio, vomiting, or diarrhea. He is not have any discomfort at this point. 10/30/2016 -- the surgical review is tomorrow and I am  awaiting the input of the surgeon. 11/16/2016 -- I had a call from his surgeon Dr. Payton Doughty, on phone number [phone] who was kind enough to call me regarding his care. After a thorough review and having consulted the abdominal wall reconstruction team they have decided that surgery will be too extensive and rather moribund for this gentleman and have asked him to continue with local care  at the wound center. She will send me an official note via Epic. Objective Constitutional [name], [name]. ([id number]) Pulse regular. Respirations normal and unlabored. Afebrile. Vitals Time Taken: 9:34 AM, Height: 65 in, Weight: 161 lbs, BMI: 26.8, Temperature: 98.2 F, Pulse: 73 bpm, Respiratory Rate: 18 breaths/min, Blood Pressure: 127/72 mmHg. Eyes Nonicteric. Reactive to light. Ears, Nose, Mouth, and Throat Lips, teeth, and gums WNL.[name] Moist mucosa without lesions. Neck supple and nontender. No palpable supraclavicular or cervical adenopathy. Normal sized without goiter. Respiratory WNL. No retractions.. Breath sounds WNL, No rubs, rales, rhonchi, or wheeze.. Cardiovascular Heart rhythm and rate regular, no murmur or gallop.. Pedal Pulses WNL. No clubbing, cyanosis or edema. Chest Breasts symmetical and no nipple discharge.. Breast tissue WNL, no masses, lumps, or tenderness.. Lymphatic No adneopathy. No adenopathy. No adenopathy. Musculoskeletal Adexa without tenderness or enlargement.. Digits and nails w/o clubbing, cyanosis, infection, petechiae, ischemia, or inflammatory conditions.[name] Psychiatric Judgement and insight Intact.. No evidence of depression, anxiety, or agitation.. General Notes: the wound on the right upper quadrant is looking very good. In the area of the left peri-a medical wound he has at the inferior aspect of Prolene suture and some mesh protruding and using a forcep and scissors at sharply removed this with minimal bleeding controlled with pressure Integumentary (Hair,  Skin) No suspicious lesions. No crepitus or fluctuance. No peri-wound warmth or erythema. No masses.. Wound #1 status is Open. Original cause of wound was Gradually Appeared. The wound is located on the Right Abdomen - midline. The wound measures 0.2cm length x 0.1cm width x 0.1cm depth; 0.016cm^2 area and 0.002cm^3 volume. The wound is limited to skin breakdown. There is no tunneling or undermining noted. There is a medium amount of serosanguineous drainage noted. The wound margin is flat and intact. There is no granulation within the wound bed. There is a large (67-100%) amount of necrotic tissue within the wound bed including Eschar. The periwound skin appearance exhibited: Scarring. The periwound skin appearance did not exhibit: Callus, Crepitus, Excoriation, Induration, Rash, Dry/Scaly, Maceration, Atrophie Blanche, Cyanosis, Ecchymosis, Hemosiderin Staining, Mottled, Pallor, Rubor, Erythema. Periwound temperature was noted as No Abnormality. [name], [name] ([id number]) Wound #2 status is Open. Original cause of wound was Gradually Appeared. The wound is located on the Left Abdomen - midline. The wound measures 5.3cm length x 1cm width x 0.1cm depth; 4.163cm^2 area and 0.416cm^3 volume. The wound is limited to skin breakdown. There is no tunneling or undermining noted. There is a large amount of purulent drainage noted. The wound margin is flat and intact. There is medium (34-66%) red granulation within the wound bed. There is a medium (34-66%) amount of necrotic tissue within the wound bed including Eschar and Adherent Slough. The periwound skin appearance exhibited: Induration, Scarring, Ecchymosis. The periwound skin appearance did not exhibit: Callus, Crepitus, Excoriation, Rash, Dry/Scaly, Maceration, Atrophie Blanche, Cyanosis, Hemosiderin Staining, Mottled, Pallor, Rubor, Erythema. Periwound temperature was noted as No Abnormality. Assessment Active Problems ICD-10 E11.622 - Type  2 diabetes mellitus with other skin ulcer S31.100A - Unspecified open wound of abdominal wall, right upper quadrant without penetration into peritoneal cavity, initial encounter S31.102A - Unspecified open wound of abdominal wall, epigastric region without penetration into peritoneal cavity, initial encounter F17.228 - Nicotine dependence, chewing tobacco, with other nicotine-induced disorders the patient has had a thorough workup and a surgical opinion from the experts at [facility] who have decided to follow conservator therapy due to his significant morbidity of doing an extensive procedure to remove the mesh  from the subcutaneous area. The patient and I have discussed this in detail and he is agreeable to come at regular intervals to have me review his wound and doing appropriate debridement. I have recommended we continue to use silver alginate locally and have discussed wound care in detail and we will see him back in 2 weeks' time. Procedures Wound #2 Pre-procedure diagnosis of Wound #2 is an Atypical located on the Left Abdomen - midline . There was a Skin/Subcutaneous Tissue Debridement ([id number]) debridement with total area of 0.36 sq cm performed by Christin Fudge, MD. with the following instrument(s): Forceps and Scissors to remove Viable and Non-Viable tissue/material including Other and Subcutaneous after achieving pain control using Lidocaine 4% Topical Solution. A time out was conducted at 09:45, prior to the start of the procedure. A Minimum amount of bleeding was controlled with Pressure. The procedure was tolerated well with a pain level of 0 throughout and a pain level of 0 following the procedure. Post Debridement Measurements: 5.3cm length x [name], [name]. ([id number]) 1cm width x 0.1cm depth; 0.416cm^3 volume. Character of Wound/Ulcer Post Debridement is improved. Post procedure Diagnosis Wound #2: Same as Pre-Procedure General Notes: the wound on the left side had some  Prolene suture and mesh protruding and I have sharply remove this from the subcutaneous region and bleeding controlled with pressure. Plan Wound Cleansing: Wound #1 Right Abdomen - midline: Clean wound with Normal Saline. May Shower, gently pat wound dry prior to applying new dressing. Wound #2 Left Abdomen - midline: Clean wound with Normal Saline. May Shower, gently pat wound dry prior to applying new dressing. Anesthetic: Wound #1 Right Abdomen - midline: Topical Lidocaine 4% cream applied to wound bed prior to debridement Wound #2 Left Abdomen - midline: Topical Lidocaine 4% cream applied to wound bed prior to debridement Skin Barriers/Peri-Wound Care: Wound #1 Right Abdomen - midline: Skin Prep Wound #2 Left Abdomen - midline: Skin Prep Primary Wound Dressing: Wound #1 Right Abdomen - midline: Aquacel Ag Wound #2 Left Abdomen - midline: Aquacel Ag Secondary Dressing: Wound #1 Right Abdomen - midline: Other - [place] Wound #2 Left Abdomen - midline: Other - [place] Dressing Change Frequency: Wound #1 Right Abdomen - midline: Change dressing every other day. Wound #2 Left Abdomen - midline: Change dressing every other day. Follow-up Appointments: Wound #1 Right Abdomen - midline: Return Appointment in 2 weeks. Wound #2 Left Abdomen - midline: Return Appointment in 2 weeks. Additional Orders / Instructions: [name], [name] ([id number]) Wound #1 Right Abdomen - midline: Other: - Go see a Engineer, drilling about your hernias and get a surgical opinion. Wound #2 Left Abdomen - midline: Other: - Go see a Engineer, drilling about your hernias and get a surgical opinion. the patient has had a thorough workup and a surgical opinion from the experts at [facility] who have decided to follow conservator therapy due to his significant morbidity of doing an extensive procedure to remove the mesh from the subcutaneous area. The patient and I have discussed this in detail and he is agreeable  to come at regular intervals to have me review his wound and doing appropriate debridement. I have recommended we continue to use silver alginate locally and have discussed wound care in detail and we will see him back in 2 weeks' time. Electronic Signature(s) Signed: 11/16/2016 12:50:15 PM By: Christin Fudge MD, FACS Entered By: Christin Fudge on 11/16/2016 09:54:14 [name] ([id number]) -------------------------------------------------------------------------------- [place] Details Patient Name: [name], [name] Date  of Service: 11/16/2016 Medical Record Number: [id number] Patient Account Number: [id number] Date of Birth/Sex: 07-21-1945 (71 y.o. Male) Treating RN: Montey Hora Primary Care Provider: Thomes Lolling Other Clinician: Referring Provider: Thomes Lolling Treating Provider/Extender: Frann Rider in Treatment: 19 Diagnosis Coding ICD-10 Codes Code Description E11.622 Type 2 diabetes mellitus with other skin ulcer Unspecified open wound of abdominal wall, right upper quadrant without penetration into S31.100A peritoneal cavity, initial encounter Unspecified open wound of abdominal wall, epigastric region without penetration into S31.102A peritoneal cavity, initial encounter F17.228 Nicotine dependence, chewing tobacco, with other nicotine-induced disorders Facility Procedures CPT4: Description Modifier Quantity Code 55732202 11042 - DEB SUBQ TISSUE 20 SQ CM/< 1 ICD-10 Description Diagnosis E11.622 Type 2 diabetes mellitus with other skin ulcer S31.100A Unspecified open wound of abdominal wall, right upper quadrant without  penetration into peritoneal cavity, initial encounter S31.102A Unspecified open wound of abdominal wall, epigastric region without penetration into peritoneal cavity, initial encounter Physician Procedures CPT4: Description Modifier Quantity Code 5427062 99213 - WC PHYS LEVEL 3 - EST PT 25 1 ICD-10 Description Diagnosis E11.622 Type 2 diabetes  mellitus with other skin ulcer S31.100A Unspecified open wound of abdominal wall, right upper quadrant without  penetration into peritoneal cavity, initial encounter S31.102A Unspecified open wound of abdominal wall, epigastric region without penetration into peritoneal cavity, initial encounter CPT4: 3762831 11042 - WC PHYS SUBQ TISS 20 SQ CM 1 Description COLVIN, BLATT ([id number]) Electronic Signature(s) Signed: 11/16/2016 12:50:15 PM By: Christin Fudge MD, FACS Entered By: Christin Fudge on 11/16/2016 09:54:33

## 2016-11-18 NOTE — Progress Notes (Signed)
[name], [name] ([id number]) Visit Report for 11/16/2016 Arrival Information Details Patient Name: [name], [name]. Date of Service: 11/16/2016 9:15 AM Medical Record Number: [id number] Patient Account Number: [id number] Date of Birth/Sex: 1945-12-30 (71 y.o. Male) Treating RN: Montey Hora Primary Care Quamaine Webb: Thomes Lolling Other Clinician: Referring Djuna Frechette: Thomes Lolling Treating Amarii Amy/Extender: Frann Rider in Treatment: 37 Visit Information History Since Last Visit Added or deleted any medications: No Patient Arrived: Ambulatory Any new allergies or adverse reactions: No Arrival Time: 09:34 Had a fall or experienced change in No Accompanied By: self activities of daily living that may affect Transfer Assistance: None risk of falls: Patient Identification Verified: Yes Signs or symptoms of abuse/neglect since last No Secondary Verification Process Yes visito Completed: Hospitalized since last visit: No Patient Requires Transmission-Based No Has Dressing in Place as Prescribed: Yes Precautions: Pain Present Now: No Patient Has Alerts: Yes Patient Alerts: DMII Electronic Signature(s) Signed: 11/16/2016 4:41:02 PM By: Montey Hora Entered By: Montey Hora on 11/16/2016 09:34:35 [name] ([id number]) -------------------------------------------------------------------------------- Encounter Discharge Information Details Patient Name: [name] Date of Service: 11/16/2016 9:15 AM Medical Record Number: [id number] Patient Account Number: [id number] Date of Birth/Sex: 27-Jun-1945 (71 y.o. Male) Treating RN: Montey Hora Primary Care Adysen Raphael: Thomes Lolling Other Clinician: Referring Vieva Brummitt: Thomes Lolling Treating Christophor Eick/Extender: Frann Rider in Treatment: 99 Encounter Discharge Information Items Discharge Pain Level: 0 Discharge Condition: Stable Ambulatory Status: Ambulatory Discharge Destination: Home Transportation: Private  Auto Accompanied By: self Schedule Follow-up Appointment: Yes Medication Reconciliation completed and provided to Patient/Care No Samier Jaco: Provided on Clinical Summary of Care: 11/16/2016 Form Type Recipient Paper Patient FB Electronic Signature(s) Signed: 11/17/2016 10:11:21 AM By: [name] Entered By: [name] on 11/16/2016 09:56:14 [name] ([id number]) -------------------------------------------------------------------------------- Multi Wound Chart Details Patient Name: [name] Date of Service: 11/16/2016 9:15 AM Medical Record Number: [id number] Patient Account Number: [id number] Date of Birth/Sex: 10/21/1945 (71 y.o. Male) Treating RN: Montey Hora Primary Care Willette Mudry: Thomes Lolling Other Clinician: Referring Lavone Weisel: Thomes Lolling Treating Tesha Archambeau/Extender: Frann Rider in Treatment: 19 Vital Signs Height(in): 65 Pulse(bpm): 73 Weight(lbs): 161 Blood Pressure 127/72 (mmHg): Body Mass Index(BMI): 27 Temperature(F): 98.2 Respiratory Rate 18 (breaths/min): Photos: [1:No Photos] [2:No Photos] [N/A:N/A] Wound Location: [1:Right Abdomen - midline] [2:Left Abdomen - midline] [N/A:N/A] Wounding Event: [1:Gradually Appeared] [2:Gradually Appeared] [N/A:N/A] Primary Etiology: [1:Atypical] [2:Atypical] [N/A:N/A] Comorbid History: [1:Cataracts, Chronic Obstructive Pulmonary Disease (COPD), Hypertension, Type II Diabetes, Osteoarthritis, Neuropathy] [2:Cataracts, Chronic Obstructive Pulmonary Disease (COPD), Hypertension, Type II Diabetes, Osteoarthritis,  Neuropathy] [N/A:N/A] Date Acquired: [1:05/17/2015] [2:05/17/2015] [N/A:N/A] Weeks of Treatment: [1:19] [2:19] [N/A:N/A] Wound Status: [1:Open] [2:Open] [N/A:N/A] Clustered Wound: [1:No] [2:Yes] [N/A:N/A] Clustered Quantity: [1:N/A] [2:3] [N/A:N/A] Measurements L x W x D 0.2x0.1x0.1 [2:5.3x1x0.1] [N/A:N/A] (cm) Area (cm) : [1:0.016] [2:4.163] [N/A:N/A] Volume (cm) : [1:0.002] [2:0.416]  [N/A:N/A] % Reduction in Area: [1:99.50%] [2:21.10%] [N/A:N/A] % Reduction in Volume: 99.40% [2:21.20%] [N/A:N/A] Classification: [1:Full Thickness Without Exposed Support Structures] [2:Full Thickness Without Exposed Support Structures] [N/A:N/A] Exudate Amount: [1:Medium] [2:Large] [N/A:N/A] Exudate Type: [1:Serosanguineous] [2:Purulent] [N/A:N/A] Exudate Color: [1:red, [place]] [2:yellow, [place], green] [N/A:N/A] Wound Margin: [1:Flat and Intact] [2:Flat and Intact] [N/A:N/A] Granulation Amount: [1:None Present (0%)] [2:Medium (34-66%)] [N/A:N/A] Granulation Quality: [1:N/A] [2:Red] [N/A:N/A] Necrotic Amount: Large (67-100%) Medium (34-66%) N/A Necrotic Tissue: Eschar Eschar, Adherent Slough N/A Exposed Structures: Fascia: No Fascia: No N/A Fat Layer (Subcutaneous Fat Layer (Subcutaneous Tissue) Exposed: No Tissue) Exposed: No Tendon: No Tendon: No Muscle: No Muscle: No Joint: No Joint: No Bone: No Bone: No Limited to  Skin Limited to Skin Breakdown Breakdown Epithelialization: Medium (34-66%) Small (1-33%) N/A Debridement: N/A Debridement (74128- N/A 11047) Pre-procedure N/A 09:45 N/A Verification/Time Out Taken: Pain Control: N/A Lidocaine 4% Topical N/A Solution Tissue Debrided: N/A Other N/A Level: N/A Skin/Subcutaneous N/A Tissue Debridement Area (sq N/A 0.36 N/A cm): Instrument: N/A Forceps, Scissors N/A Bleeding: N/A Minimum N/A Hemostasis Achieved: N/A Pressure N/A Procedural Pain: N/A 0 N/A Post Procedural Pain: N/A 0 N/A Debridement Treatment N/A Procedure was tolerated N/A Response: well Post Debridement N/A 5.3x1x0.1 N/A Measurements L x W x D (cm) Post Debridement N/A 0.416 N/A Volume: (cm) Periwound Skin Texture: Scarring: Yes Induration: Yes N/A Excoriation: No Scarring: Yes Induration: No Excoriation: No Callus: No Callus: No Crepitus: No Crepitus: No Rash: No Rash: No Periwound Skin Maceration: No Maceration: No  N/A Moisture: Dry/Scaly: No Dry/Scaly: No Periwound Skin Color: Atrophie Blanche: No Ecchymosis: Yes N/A Cyanosis: No Atrophie Blanche: No Ecchymosis: No Cyanosis: No Erythema: No Erythema: No Hemosiderin Staining: No Hemosiderin Staining: No Mottled: No Mottled: No [name], [name] ([id number]) Pallor: No Pallor: No Rubor: No Rubor: No Temperature: No Abnormality No Abnormality N/A Tenderness on No No N/A Palpation: Wound Preparation: Ulcer Cleansing: Ulcer Cleansing: N/A Rinsed/Irrigated with Rinsed/Irrigated with Saline Saline Topical Anesthetic Topical Anesthetic Applied: Other: lidocaine Applied: Other: lidocaine 4% 4% Procedures Performed: N/A Debridement N/A Treatment Notes Electronic Signature(s) Signed: 11/16/2016 12:50:15 PM By: Christin Fudge MD, FACS Entered By: Christin Fudge on 11/16/2016 09:51:11 [name] ([id number]) -------------------------------------------------------------------------------- [place] Details Patient Name: [name], [name] Date of Service: 11/16/2016 9:15 AM Medical Record Number: [id number] Patient Account Number: [id number] Date of Birth/Sex: 11-11-1945 (71 y.o. Male) Treating RN: Montey Hora Primary Care Emila Steinhauser: Thomes Lolling Other Clinician: Referring [place] Whedbee: Thomes Lolling Treating Devarion Mcclanahan/Extender: Frann Rider in Treatment: 73 Active Inactive ` Abuse / Safety / Falls / Self Care Management Nursing Diagnoses: Impaired physical mobility Goals: Patient will remain injury free Date Initiated: 07/03/2016 Target Resolution Date: 09/14/2016 Goal Status: Active Interventions: Assess fall risk on admission and as needed Notes: ` Orientation to the Wound Care Program Nursing Diagnoses: Knowledge deficit related to the wound healing center program Goals: Patient/caregiver will verbalize understanding of the [place] Program Date Initiated: 07/03/2016 Target Resolution Date:  09/15/2016 Goal Status: Active Interventions: Provide education on orientation to the wound center Notes: ` Wound/Skin Impairment Nursing Diagnoses: Impaired tissue integrity [name], [name] ([id number]) Goals: Patient/caregiver will verbalize understanding of skin care regimen Date Initiated: 07/03/2016 Target Resolution Date: 09/15/2016 Goal Status: Active Ulcer/skin breakdown will have a volume reduction of 30% by week 4 Date Initiated: 07/03/2016 Target Resolution Date: 09/15/2016 Goal Status: Active Ulcer/skin breakdown will have a volume reduction of 50% by week 8 Date Initiated: 07/03/2016 Target Resolution Date: 09/15/2016 Goal Status: Active Ulcer/skin breakdown will have a volume reduction of 80% by week 12 Date Initiated: 07/03/2016 Target Resolution Date: 09/15/2016 Goal Status: Active Ulcer/skin breakdown will heal within 14 weeks Date Initiated: 07/03/2016 Target Resolution Date: 09/15/2016 Goal Status: Active Interventions: Assess patient/caregiver ability to obtain necessary supplies Assess patient/caregiver ability to perform ulcer/skin care regimen upon admission and as needed Assess ulceration(s) every visit Notes: Electronic Signature(s) Signed: 11/16/2016 4:41:02 PM By: Montey Hora Entered By: Montey Hora on 11/16/2016 09:43:05 [name] ([id number]) -------------------------------------------------------------------------------- Pain Assessment Details Patient Name: [name] Date of Service: 11/16/2016 9:15 AM Medical Record Number: [id number] Patient Account Number: [id number] Date of Birth/Sex: September 23, 1945 (71 y.o. Male) Treating RN: Montey Hora Primary Care Hatsue Sime: Thomes Lolling  Other Clinician: Referring Cherolyn Behrle: Thomes Lolling Treating Ruford Dudzinski/Extender: Frann Rider in Treatment: 19 Active Problems Location of Pain Severity and Description of Pain Patient Has Paino No Site Locations Pain Management and Medication Current Pain  Management: Notes Topical or injectable lidocaine is offered to patient for acute pain when surgical debridement is performed. If needed, Patient is instructed to use over the counter pain medication for the following 24-48 hours after debridement. Wound care MDs do not prescribed pain medications. Patient has chronic pain or uncontrolled pain. Patient has been instructed to make an appointment with their Primary Care Physician for pain management. Electronic Signature(s) Signed: 11/16/2016 4:41:02 PM By: Montey Hora Entered By: Montey Hora on 11/16/2016 09:34:42 [name] ([id number]) -------------------------------------------------------------------------------- Patient/Caregiver Education Details Patient Name: [name], [name] Date of Service: 11/16/2016 9:15 AM Medical Record Number: [id number] Patient Account Number: [id number] Date of Birth/Gender: 05/10/1945 (71 y.o. Male) Treating RN: Montey Hora Primary Care Physician: Thomes Lolling Other Clinician: Referring Physician: Thomes Lolling Treating Physician/Extender: Frann Rider in Treatment: 39 Education Assessment Education Provided To: Patient Education Topics Provided Wound/Skin Impairment: Handouts: Other: wound care as ordered Methods: Demonstration, Explain/Verbal Responses: State content correctly Electronic Signature(s) Signed: 11/16/2016 4:41:02 PM By: Montey Hora Entered By: Montey Hora on 11/16/2016 09:43:51 [name] ([id number]) -------------------------------------------------------------------------------- Wound Assessment Details Patient Name: [name] Date of Service: 11/16/2016 9:15 AM Medical Record Number: [id number] Patient Account Number: [id number] Date of Birth/Sex: May 23, 1945 (71 y.o. Male) Treating RN: Montey Hora Primary Care Natoria Archibald: Thomes Lolling Other Clinician: Referring Janille Draughon: Thomes Lolling Treating Eriana Suliman/Extender: Frann Rider in  Treatment: 19 Wound Status Wound Number: 1 Primary Atypical Etiology: Wound Location: Right Abdomen - midline Wound Open Wounding Event: Gradually Appeared Status: Date Acquired: 05/17/2015 Comorbid Cataracts, Chronic Obstructive Weeks Of Treatment: 19 History: Pulmonary Disease (COPD), Clustered Wound: No Hypertension, Type II Diabetes, Osteoarthritis, Neuropathy Photos Photo Uploaded By: Montey Hora on 11/16/2016 14:51:16 Wound Measurements Length: (cm) 0.2 Width: (cm) 0.1 Depth: (cm) 0.1 Area: (cm) 0.016 Volume: (cm) 0.002 % Reduction in Area: 99.5% % Reduction in Volume: 99.4% Epithelialization: Medium (34-66%) Tunneling: No Undermining: No Wound Description Full Thickness Without Exposed Classification: Support Structures Wound Margin: Flat and Intact Exudate Medium Amount: Exudate Type: Serosanguineous Exudate Color: red, [place]egan Foul Odor After Cleansing: No Slough/Fibrino No Wound Bed Granulation Amount: None Present (0%) Exposed Structure [name], [name] ([id number]) Necrotic Amount: Large (67-100%) Fascia Exposed: No Necrotic Quality: Eschar Fat Layer (Subcutaneous Tissue) Exposed: No Tendon Exposed: No Muscle Exposed: No Joint Exposed: No Bone Exposed: No Limited to Skin Breakdown Periwound Skin Texture Texture Color No Abnormalities Noted: No No Abnormalities Noted: No Callus: No Atrophie Blanche: No Crepitus: No Cyanosis: No Excoriation: No Ecchymosis: No Induration: No Erythema: No Rash: No Hemosiderin Staining: No Scarring: Yes Mottled: No Pallor: No Moisture Rubor: No No Abnormalities Noted: No Dry / Scaly: No Temperature / Pain Maceration: No Temperature: No Abnormality Wound Preparation Ulcer Cleansing: Rinsed/Irrigated with Saline Topical Anesthetic Applied: Other: lidocaine 4%, Treatment Notes Wound #1 (Right Abdomen - midline) 1. Cleansed with: Clean wound with Normal Saline 2. Anesthetic Topical Lidocaine 4%  cream to wound bed prior to debridement 3. Peri-wound Care: Skin Prep 4. Dressing Applied: Aquacel Ag 5. Secondary [place] Signature(s) Signed: 11/16/2016 4:41:02 PM By: Montey Hora Entered By: Montey Hora on 11/16/2016 09:42:42 [name] ([id number]) -------------------------------------------------------------------------------- Wound Assessment Details Patient Name: [name] Date of Service: 11/16/2016 9:15 AM Medical Record Number: [id number] Patient Account Number:  [id number] Date of Birth/Sex: September 26, 1945 (71 y.o. Male) Treating RN: Montey Hora Primary Care Cher Franzoni: Thomes Lolling Other Clinician: Referring Kristle Wesch: Thomes Lolling Treating Jiayi Lengacher/Extender: Frann Rider in Treatment: 19 Wound Status Wound Number: 2 Primary Atypical Etiology: Wound Location: Left Abdomen - midline Wound Open Wounding Event: Gradually Appeared Status: Date Acquired: 05/17/2015 Comorbid Cataracts, Chronic Obstructive Weeks Of Treatment: 19 History: Pulmonary Disease (COPD), Clustered Wound: Yes Hypertension, Type II Diabetes, Osteoarthritis, Neuropathy Photos Photo Uploaded By: Montey Hora on 11/16/2016 14:51:16 Wound Measurements Length: (cm) 5.3 Width: (cm) 1 Depth: (cm) 0.1 Clustered Quantity: 3 Area: (cm) 4.163 Volume: (cm) 0.416 % Reduction in Area: 21.1% % Reduction in Volume: 21.2% Epithelialization: Small (1-33%) Tunneling: No Undermining: No Wound Description Full Thickness Without Exposed Foul Odor Afte Classification: Support Structures Slough/Fibrino Wound Margin: Flat and Intact Exudate Large Amount: Exudate Type: Purulent Exudate Color: yellow, [place], green r Cleansing: No No Wound Bed [name], [name] ([id number]) Granulation Amount: Medium (34-66%) Exposed Structure Granulation Quality: Red Fascia Exposed: No Necrotic Amount: Medium (34-66%) Fat Layer (Subcutaneous Tissue) Exposed:  No Necrotic Quality: Eschar, Adherent Slough Tendon Exposed: No Muscle Exposed: No Joint Exposed: No Bone Exposed: No Limited to Skin Breakdown Periwound Skin Texture Texture Color No Abnormalities Noted: No No Abnormalities Noted: No Callus: No Atrophie Blanche: No Crepitus: No Cyanosis: No Excoriation: No Ecchymosis: Yes Induration: Yes Erythema: No Rash: No Hemosiderin Staining: No Scarring: Yes Mottled: No Pallor: No Moisture Rubor: No No Abnormalities Noted: No Dry / Scaly: No Temperature / Pain Maceration: No Temperature: No Abnormality Wound Preparation Ulcer Cleansing: Rinsed/Irrigated with Saline Topical Anesthetic Applied: Other: lidocaine 4%, Treatment Notes Wound #2 (Left Abdomen - midline) 1. Cleansed with: Clean wound with Normal Saline 2. Anesthetic Topical Lidocaine 4% cream to wound bed prior to debridement 3. Peri-wound Care: Skin Prep 4. Dressing Applied: Aquacel Ag 5. Secondary [place] Signature(s) Signed: 11/16/2016 4:41:02 PM By: Montey Hora Entered By: Montey Hora on 11/16/2016 09:42:56 [name] ([id number]) -------------------------------------------------------------------------------- [place] Details Patient Name: [name] Date of Service: 11/16/2016 9:15 AM Medical Record Number: [id number] Patient Account Number: [id number] Date of Birth/Sex: 1945-10-10 (71 y.o. Male) Treating RN: Montey Hora Primary Care Wilma Michaelson: Thomes Lolling Other Clinician: Referring Rima Blizzard: Thomes Lolling Treating Raileigh Sabater/Extender: Frann Rider in Treatment: 19 Vital Signs Time Taken: 09:34 Temperature (F): 98.2 Height (in): 65 Pulse (bpm): 73 Weight (lbs): 161 Respiratory Rate (breaths/min): 18 Body Mass Index (BMI): 26.8 Blood Pressure (mmHg): 127/72 Reference Range: 80 - 120 mg / dl Electronic Signature(s) Signed: 11/16/2016 4:41:02 PM By: Montey Hora Entered By: Montey Hora on  11/16/2016 35:45:62

## 2016-11-25 MED ORDER — OXYCODONE-ACETAMINOPHEN 5 MG-325 MG TABLET
ORAL_TABLET | Freq: Four times a day (QID) | ORAL | 0 refills | 0.00000 days | Status: CP | PRN
Start: 2016-11-25 — End: 2016-12-26

## 2016-11-30 ENCOUNTER — Encounter: Payer: Medicare Other | Admitting: Surgery

## 2016-11-30 DIAGNOSIS — E11622 Type 2 diabetes mellitus with other skin ulcer: Secondary | ICD-10-CM | POA: Diagnosis not present

## 2016-12-01 NOTE — Progress Notes (Addendum)
RALIEGH, SCOBIE ([id number]) Visit Report for 11/30/2016 Arrival Information Details Patient Name: [name], [name]. Date of Service: 11/30/2016 10:15 AM Medical Record Number: [id number] Patient Account Number: [id number] Date of Birth/Sex: 03-21-1945 (71 y.o. Male) Treating RN: Montey Hora Primary Care Jullien Granquist: Thomes Lolling Other Clinician: Referring Benny Deutschman: Thomes Lolling Treating Erin Obando/Extender: Frann Rider in Treatment: 21 Visit Information History Since Last Visit Added or deleted any medications: No Patient Arrived: Ambulatory Any new allergies or adverse reactions: No Arrival Time: 10:10 Had a fall or experienced change in No Accompanied By: self activities of daily living that may affect Transfer Assistance: None risk of falls: Patient Identification Verified: Yes Signs or symptoms of abuse/neglect since last No Secondary Verification Process Yes visito Completed: Hospitalized since last visit: No Patient Requires Transmission-Based No Has Dressing in Place as Prescribed: Yes Precautions: Pain Present Now: No Patient Has Alerts: Yes Patient Alerts: DMII Electronic Signature(s) Signed: 11/30/2016 4:38:09 PM By: Montey Hora Entered By: Montey Hora on 11/30/2016 10:10:26 [name] ([id number]) -------------------------------------------------------------------------------- Clinic Level of Care Assessment Details Patient Name: [name] Date of Service: 11/30/2016 10:15 AM Medical Record Number: [id number] Patient Account Number: [id number] Date of Birth/Sex: [birth year] (71 y.o. Male) Treating RN: Montey Hora Primary Care Khaiden Segreto: Thomes Lolling Other Clinician: Referring Rayme Bui: Thomes Lolling Treating Lavren Lewan/Extender: Frann Rider in Treatment: 21 Clinic Level of Care Assessment Items TOOL 4 Quantity Score []  - Use when only an EandM is performed on FOLLOW-UP visit 0 ASSESSMENTS - Nursing Assessment / Reassessment X -  Reassessment of Co-morbidities (includes updates in patient status) 1 10 X - Reassessment of Adherence to Treatment Plan 1 5 ASSESSMENTS - Wound and Skin Assessment / Reassessment []  - Simple Wound Assessment / Reassessment - one wound 0 X - Complex Wound Assessment / Reassessment - multiple wounds 2 5 []  - Dermatologic / Skin Assessment (not related to wound area) 0 ASSESSMENTS - Focused Assessment []  - Circumferential Edema Measurements - multi extremities 0 []  - Nutritional Assessment / Counseling / Intervention 0 []  - Lower Extremity Assessment (monofilament, tuning fork, pulses) 0 []  - Peripheral Arterial Disease Assessment (using hand held doppler) 0 ASSESSMENTS - Ostomy and/or Continence Assessment and Care []  - Incontinence Assessment and Management 0 []  - Ostomy Care Assessment and Management (repouching, etc.) 0 PROCESS - Coordination of Care X - Simple Patient / Family Education for ongoing care 1 15 []  - Complex (extensive) Patient / Family Education for ongoing care 0 []  - Staff obtains Programmer, systems, Records, Test Results / Process Orders 0 []  - Staff telephones HHA, Nursing Homes / Clarify orders / etc 0 []  - Routine Transfer to another Facility (non-emergent condition) 0 [name], [name] ([id number]) []  - Routine Hospital Admission (non-emergent condition) 0 []  - New Admissions / Biomedical engineer / Ordering NPWT, Apligraf, etc. 0 []  - Emergency Hospital Admission (emergent condition) 0 X - Simple Discharge Coordination 1 10 []  - Complex (extensive) Discharge Coordination 0 PROCESS - Special Needs []  - Pediatric / Minor Patient Management 0 []  - Isolation Patient Management 0 []  - Hearing / Language / Visual special needs 0 []  - Assessment of Community assistance (transportation, D/C planning, etc.) 0 []  - Additional assistance / Altered mentation 0 []  - Support Surface(s) Assessment (bed, cushion, seat, etc.) 0 INTERVENTIONS - Wound Cleansing / Measurement []  -  Simple Wound Cleansing - one wound 0 X - Complex Wound Cleansing - multiple wounds 2 5 X - Wound Imaging (photographs - any number of wounds) 1 5 []  -  Wound Tracing (instead of photographs) 0 []  - Simple Wound Measurement - one wound 0 X - Complex Wound Measurement - multiple wounds 2 5 INTERVENTIONS - Wound Dressings X - Small Wound Dressing one or multiple wounds 2 10 []  - Medium Wound Dressing one or multiple wounds 0 []  - Large Wound Dressing one or multiple wounds 0 []  - Application of Medications - topical 0 []  - Application of Medications - injection 0 INTERVENTIONS - Miscellaneous []  - External ear exam 0 [name], [name] ([id number]) []  - Specimen Collection (cultures, biopsies, blood, body fluids, etc.) 0 []  - Specimen(s) / Culture(s) sent or taken to Lab for analysis 0 []  - Patient Transfer (multiple staff / Harrel Lemon Lift / Similar devices) 0 []  - Simple Staple / Suture removal (25 or less) 0 []  - Complex Staple / Suture removal (26 or more) 0 []  - Hypo / Hyperglycemic Management (close monitor of Blood Glucose) 0 []  - Ankle / Brachial Index (ABI) - do not check if billed separately 0 X - Vital Signs 1 5 Has the patient been seen at the hospital within the last three years: Yes Total Score: 100 Level Of Care: New/Established - Level 3 Electronic Signature(s) Signed: 11/30/2016 4:38:09 PM By: Montey Hora Entered By: Montey Hora on 11/30/2016 14:15:06 [name] ([id number]) -------------------------------------------------------------------------------- Complex / Palliative Patient Assessment Details Patient Name: [name] Date of Service: 11/30/2016 10:15 AM Medical Record Number: [id number] Patient Account Number: [id number] Date of Birth/Sex: January 15, 1946 (71 y.o. Male) Treating RN: Cornell Barman Primary Care Diamonique Ruedas: Thomes Lolling Other Clinician: Referring Paysley Poplar: Thomes Lolling Treating Dawt Reeb/Extender: Frann Rider in Treatment: 21 Palliative  Management Criteria Complex Wound Management Criteria Patient requires a surgical procedure in order to achieve wound healing: in process However, the surgeon has determined that the patient is not a surgical candidate due to medical status. Care Approach Wound Care Plan: Complex Wound Management Electronic Signature(s) Signed: 12/19/2016 4:51:31 PM By: Gretta Cool, BSN, RN, CWS, Kim RN, BSN Signed: 12/22/2016 8:32:13 AM By: Christin Fudge MD, FACS Entered By: Gretta Cool, BSN, RN, CWS, Kim on 12/19/2016 16:51:31 AALIYAH, CANCRO ([id number]) -------------------------------------------------------------------------------- Encounter Discharge Information Details Patient Name: [name], [name] Date of Service: 11/30/2016 10:15 AM Medical Record Number: [id number] Patient Account Number: [id number] Date of Birth/Sex: [birth year] (71 y.o. Male) Treating RN: Montey Hora Primary Care Liliah Dorian: Thomes Lolling Other Clinician: Referring Mickle Campton: Thomes Lolling Treating Ninel Abdella/Extender: Frann Rider in Treatment: 21 Encounter Discharge Information Items Discharge Pain Level: 0 Discharge Condition: Stable Ambulatory Status: Ambulatory Discharge Destination: Home Transportation: Private Auto Accompanied By: self Schedule Follow-up Appointment: Yes Medication Reconciliation completed and provided to Patient/Care No [name]: Provided on Clinical Summary of Care: 11/30/2016 Form Type Recipient Paper Patient FB Electronic Signature(s) Signed: 11/30/2016 4:38:09 PM By: Montey Hora Entered By: Montey Hora on 11/30/2016 14:15:56 [name] ([id number]) -------------------------------------------------------------------------------- Multi Wound Chart Details Patient Name: [name] Date of Service: 11/30/2016 10:15 AM Medical Record Number: [id number] Patient Account Number: [id number] Date of Birth/Sex: October 15, 1945 (71 y.o. Male) Treating RN: Montey Hora Primary Care Avis Mcmahill:  Thomes Lolling Other Clinician: Referring Ardena Gangl: Thomes Lolling Treating Tristin Gladman/Extender: Frann Rider in Treatment: 21 Vital Signs Height(in): 65 Pulse(bpm): 65 Weight(lbs): 161 Blood Pressure 120/67 (mmHg): Body Mass Index(BMI): 27 Temperature(F): 97.7 Respiratory Rate 16 (breaths/min): Photos: [1:No Photos] [2:No Photos] [N/A:N/A] Wound Location: [1:Right Abdomen - midline] [2:Left Abdomen - midline] [N/A:N/A] Wounding Event: [1:Gradually Appeared] [2:Gradually Appeared] [N/A:N/A] Primary Etiology: [1:Atypical] [2:Atypical] [N/A:N/A] Comorbid History: [1:Cataracts, Chronic Obstructive Pulmonary Disease (  COPD), Hypertension, Type II Diabetes, Osteoarthritis, Neuropathy] [2:Cataracts, Chronic Obstructive Pulmonary Disease (COPD), Hypertension, Type II Diabetes, Osteoarthritis,  Neuropathy] [N/A:N/A] Date Acquired: [1:05/17/2015] [2:05/17/2015] [N/A:N/A] Weeks of Treatment: [1:21] [2:21] [N/A:N/A] Wound Status: [1:Open] [2:Open] [N/A:N/A] Clustered Wound: [1:No] [2:Yes] [N/A:N/A] Clustered Quantity: [1:N/A] [2:3] [N/A:N/A] Measurements L x W x D 0.1x0.1x0.1 [2:5.3x0.5x0.1] [N/A:N/A] (cm) Area (cm) : [1:0.008] [2:2.081] [N/A:N/A] Volume (cm) : [1:0.001] [2:0.208] [N/A:N/A] % Reduction in Area: [1:99.80%] [2:60.60%] [N/A:N/A] % Reduction in Volume: 99.70% [2:60.60%] [N/A:N/A] Classification: [1:Full Thickness Without Exposed Support Structures] [2:Full Thickness Without Exposed Support Structures] [N/A:N/A] Exudate Amount: [1:Medium] [2:Large] [N/A:N/A] Exudate Type: [1:Serosanguineous] [2:Purulent] [N/A:N/A] Exudate Color: [1:red, Schueler] [2:yellow, Reder, green] [N/A:N/A] Wound Margin: [1:Flat and Intact] [2:Flat and Intact] [N/A:N/A] Granulation Amount: [1:None Present (0%)] [2:Medium (34-66%)] [N/A:N/A] Granulation Quality: [1:N/A] [2:Red] [N/A:N/A] Necrotic Amount: Large (67-100%) Medium (34-66%) N/A Necrotic Tissue: Eschar Eschar, Adherent Slough N/A Exposed  Structures: Fascia: No Fascia: No N/A Fat Layer (Subcutaneous Fat Layer (Subcutaneous Tissue) Exposed: No Tissue) Exposed: No Tendon: No Tendon: No Muscle: No Muscle: No Joint: No Joint: No Bone: No Bone: No Limited to Skin Limited to Skin Breakdown Breakdown Epithelialization: Medium (34-66%) Small (1-33%) N/A Periwound Skin Texture: Scarring: Yes Induration: Yes N/A Excoriation: No Scarring: Yes Induration: No Excoriation: No Callus: No Callus: No Crepitus: No Crepitus: No Rash: No Rash: No Periwound Skin Maceration: No Maceration: No N/A Moisture: Dry/Scaly: No Dry/Scaly: No Periwound Skin Color: Atrophie Blanche: No Ecchymosis: Yes N/A Cyanosis: No Atrophie Blanche: No Ecchymosis: No Cyanosis: No Erythema: No Erythema: No Hemosiderin Staining: No Hemosiderin Staining: No Mottled: No Mottled: No Pallor: No Pallor: No Rubor: No Rubor: No Temperature: No Abnormality No Abnormality N/A Tenderness on No No N/A Palpation: Wound Preparation: Ulcer Cleansing: Ulcer Cleansing: N/A Rinsed/Irrigated with Rinsed/Irrigated with Saline Saline Topical Anesthetic Topical Anesthetic Applied: Other: lidocaine Applied: Other: lidocaine 4% 4% Treatment Notes Electronic Signature(s) Signed: 11/30/2016 2:14:22 PM By: Christin Fudge MD, FACS Entered By: Christin Fudge on 11/30/2016 10:36:36 [name] ([id number]) -------------------------------------------------------------------------------- [place] Details Patient Name: [name], [name] Date of Service: 11/30/2016 10:15 AM Medical Record Number: [id number] Patient Account Number: [id number] Date of Birth/Sex: March 23, 1945 (71 y.o. Male) Treating RN: Montey Hora Primary Care Vedant Shehadeh: Thomes Lolling Other Clinician: Referring Saretta Dahlem: Thomes Lolling Treating Dayelin Balducci/Extender: Frann Rider in Treatment: 21 Active Inactive ` Abuse / Safety / Falls / Self Care Management Nursing  Diagnoses: Impaired physical mobility Goals: Patient will remain injury free Date Initiated: 07/03/2016 Target Resolution Date: 09/14/2016 Goal Status: Active Interventions: Assess fall risk on admission and as needed Notes: ` Orientation to the Wound Care Program Nursing Diagnoses: Knowledge deficit related to the wound healing center program Goals: Patient/caregiver will verbalize understanding of the [facility] Date Initiated: 07/03/2016 Target Resolution Date: 09/15/2016 Goal Status: Active Interventions: Provide education on orientation to the wound center Notes: ` Wound/Skin Impairment Nursing Diagnoses: Impaired tissue integrity [name], [name] ([id number]) Goals: Patient/caregiver will verbalize understanding of skin care regimen Date Initiated: 07/03/2016 Target Resolution Date: 09/15/2016 Goal Status: Active Ulcer/skin breakdown will have a volume reduction of 30% by week 4 Date Initiated: 07/03/2016 Target Resolution Date: 09/15/2016 Goal Status: Active Ulcer/skin breakdown will have a volume reduction of 50% by week 8 Date Initiated: 07/03/2016 Target Resolution Date: 09/15/2016 Goal Status: Active Ulcer/skin breakdown will have a volume reduction of 80% by week 12 Date Initiated: 07/03/2016 Target Resolution Date: 09/15/2016 Goal Status: Active Ulcer/skin breakdown will heal within 14 weeks Date Initiated: 07/03/2016 Target Resolution Date: 09/15/2016  Goal Status: Active Interventions: Assess patient/caregiver ability to obtain necessary supplies Assess patient/caregiver ability to perform ulcer/skin care regimen upon admission and as needed Assess ulceration(s) every visit Notes: Electronic Signature(s) Signed: 11/30/2016 4:38:09 PM By: Montey Hora Entered By: Montey Hora on 11/30/2016 10:20:05 [name] ([id number]) -------------------------------------------------------------------------------- Pain Assessment Details Patient Name:  [name] Date of Service: 11/30/2016 10:15 AM Medical Record Number: [id number] Patient Account Number: [id number] Date of Birth/Sex: 10/10/1945 (71 y.o. Male) Treating RN: Montey Hora Primary Care Srijan Givan: Thomes Lolling Other Clinician: Referring Shaneeka Scarboro: Thomes Lolling Treating Sharetta Ricchio/Extender: Frann Rider in Treatment: 21 Active Problems Location of Pain Severity and Description of Pain Patient Has Paino No Site Locations Pain Management and Medication Current Pain Management: Notes Topical or injectable lidocaine is offered to patient for acute pain when surgical debridement is performed. If needed, Patient is instructed to use over the counter pain medication for the following 24-48 hours after debridement. Wound care MDs do not prescribed pain medications. Patient has chronic pain or uncontrolled pain. Patient has been instructed to make an appointment with their Primary Care Physician for pain management. Electronic Signature(s) Signed: 11/30/2016 4:38:09 PM By: Montey Hora Entered By: Montey Hora on 11/30/2016 10:10:51 [name] ([id number]) -------------------------------------------------------------------------------- Patient/Caregiver Education Details Patient Name: [name], [name] Date of Service: 11/30/2016 10:15 AM Medical Record Number: [id number] Patient Account Number: [id number] Date of Birth/Gender: [birth year] (71 y.o. Male) Treating RN: Montey Hora Primary Care Physician: Thomes Lolling Other Clinician: Referring Physician: Thomes Lolling Treating Physician/Extender: Frann Rider in Treatment: 21 Education Assessment Education Provided To: Patient Education Topics Provided Wound/Skin Impairment: Handouts: Other: wound care as ordered Methods: Explain/Verbal Responses: State content correctly Electronic Signature(s) Signed: 11/30/2016 4:38:09 PM By: Montey Hora Entered By: Montey Hora on 11/30/2016  14:16:16 [name] ([id number]) -------------------------------------------------------------------------------- Wound Assessment Details Patient Name: [name] Date of Service: 11/30/2016 10:15 AM Medical Record Number: [id number] Patient Account Number: [id number] Date of Birth/Sex: 11-Apr-1945 (72 y.o. Male) Treating RN: Montey Hora Primary Care Jone Panebianco: Thomes Lolling Other Clinician: Referring Bray Vickerman: Thomes Lolling Treating Jontavius Rabalais/Extender: Frann Rider in Treatment: 21 Wound Status Wound Number: 1 Primary Atypical Etiology: Wound Location: Right Abdomen - midline Wound Open Wounding Event: Gradually Appeared Status: Date Acquired: 05/17/2015 Comorbid Cataracts, Chronic Obstructive Weeks Of Treatment: 21 History: Pulmonary Disease (COPD), Clustered Wound: No Hypertension, Type II Diabetes, Osteoarthritis, Neuropathy Photos Photo Uploaded By: Montey Hora on 11/30/2016 16:13:02 Wound Measurements Length: (cm) 0.1 Width: (cm) 0.1 Depth: (cm) 0.1 Area: (cm) 0.008 Volume: (cm) 0.001 % Reduction in Area: 99.8% % Reduction in Volume: 99.7% Epithelialization: Medium (34-66%) Tunneling: No Undermining: No Wound Description Full Thickness Without Exposed Classification: Support Structures Wound Margin: Flat and Intact Exudate Medium Amount: Exudate Type: Serosanguineous Exudate Color: red, Albea Foul Odor After Cleansing: No Slough/Fibrino No Wound Bed Granulation Amount: None Present (0%) Exposed Structure [name], [name] ([id number]) Necrotic Amount: Large (67-100%) Fascia Exposed: No Necrotic Quality: Eschar Fat Layer (Subcutaneous Tissue) Exposed: No Tendon Exposed: No Muscle Exposed: No Joint Exposed: No Bone Exposed: No Limited to Skin Breakdown Periwound Skin Texture Texture Color No Abnormalities Noted: No No Abnormalities Noted: No Callus: No Atrophie Blanche: No Crepitus: No Cyanosis: No Excoriation:  No Ecchymosis: No Induration: No Erythema: No Rash: No Hemosiderin Staining: No Scarring: Yes Mottled: No Pallor: No Moisture Rubor: No No Abnormalities Noted: No Dry / Scaly: No Temperature / Pain Maceration: No Temperature: No Abnormality Wound Preparation Ulcer Cleansing: Rinsed/Irrigated with Saline Topical Anesthetic Applied: Other: lidocaine  4%, Electronic Signature(s) Signed: 11/30/2016 4:38:09 PM By: Montey Hora Entered By: Montey Hora on 11/30/2016 10:19:35 [name] ([id number]) -------------------------------------------------------------------------------- Wound Assessment Details Patient Name: [name] Date of Service: 11/30/2016 10:15 AM Medical Record Number: [id number] Patient Account Number: [id number] Date of Birth/Sex: 01-10-1946 (71 y.o. Male) Treating RN: Montey Hora Primary Care Chaka Jefferys: Thomes Lolling Other Clinician: Referring Verline Kong: Thomes Lolling Treating Mohmed Farver/Extender: Frann Rider in Treatment: 21 Wound Status Wound Number: 2 Primary Atypical Etiology: Wound Location: Left Abdomen - midline Wound Open Wounding Event: Gradually Appeared Status: Date Acquired: 05/17/2015 Comorbid Cataracts, Chronic Obstructive Weeks Of Treatment: 21 History: Pulmonary Disease (COPD), Clustered Wound: Yes Hypertension, Type II Diabetes, Osteoarthritis, Neuropathy Photos Photo Uploaded By: Montey Hora on 11/30/2016 16:13:03 Wound Measurements Length: (cm) 5.3 Width: (cm) 0.5 Depth: (cm) 0.1 Clustered Quantity: 3 Area: (cm) 2.081 Volume: (cm) 0.208 % Reduction in Area: 60.6% % Reduction in Volume: 60.6% Epithelialization: Small (1-33%) Tunneling: No Undermining: No Wound Description Full Thickness Without Exposed Foul Odor Afte Classification: Support Structures Slough/Fibrino Wound Margin: Flat and Intact Exudate Large Amount: Exudate Type: Purulent Exudate Color: yellow, Kolden, green r Cleansing:  No No Wound Bed [name], [name] ([id number]) Granulation Amount: Medium (34-66%) Exposed Structure Granulation Quality: Red Fascia Exposed: No Necrotic Amount: Medium (34-66%) Fat Layer (Subcutaneous Tissue) Exposed: No Necrotic Quality: Eschar, Adherent Slough Tendon Exposed: No Muscle Exposed: No Joint Exposed: No Bone Exposed: No Limited to Skin Breakdown Periwound Skin Texture Texture Color No Abnormalities Noted: No No Abnormalities Noted: No Callus: No Atrophie Blanche: No Crepitus: No Cyanosis: No Excoriation: No Ecchymosis: Yes Induration: Yes Erythema: No Rash: No Hemosiderin Staining: No Scarring: Yes Mottled: No Pallor: No Moisture Rubor: No No Abnormalities Noted: No Dry / Scaly: No Temperature / Pain Maceration: No Temperature: No Abnormality Wound Preparation Ulcer Cleansing: Rinsed/Irrigated with Saline Topical Anesthetic Applied: Other: lidocaine 4%, Electronic Signature(s) Signed: 11/30/2016 4:38:09 PM By: Montey Hora Entered By: Montey Hora on 11/30/2016 10:19:57 [name] ([id number]) -------------------------------------------------------------------------------- [place] Details Patient Name: [name] Date of Service: 11/30/2016 10:15 AM Medical Record Number: [id number] Patient Account Number: [id number] Date of Birth/Sex: [birth year] (71 y.o. Male) Treating RN: Montey Hora Primary Care Shulem Mader: Thomes Lolling Other Clinician: Referring Amritha Yorke: Thomes Lolling Treating Anber Mckiver/Extender: Frann Rider in Treatment: 21 Vital Signs Time Taken: 10:10 Temperature (F): 97.7 Height (in): 65 Pulse (bpm): 65 Weight (lbs): 161 Respiratory Rate (breaths/min): 16 Body Mass Index (BMI): 26.8 Blood Pressure (mmHg): 120/67 Reference Range: 80 - 120 mg / dl Electronic Signature(s) Signed: 11/30/2016 4:38:09 PM By: Montey Hora Entered By: Montey Hora on 11/30/2016 10:12:16

## 2016-12-01 NOTE — Progress Notes (Signed)
[name], [name] ([id number]) Visit Report for 11/30/2016 Chief Complaint Document Details Patient Name: [name]. Date of Service: 11/30/2016 10:15 AM Medical Record Number: [id number] Patient Account Number: [id number] Date of Birth/Sex: [birth year] (71 y.o. Male) Treating RN: Montey Hora Primary Care Provider: Thomes Lolling Other Clinician: Referring Provider: Thomes Lolling Treating Provider/Extender: Frann Rider in Treatment: 21 Information Obtained from: Patient Chief Complaint Patients presents for treatment of an open diabetic ulcer to the anterior abdominal wall in the epigastric and right upper quadrant Electronic Signature(s) Signed: 11/30/2016 2:14:22 PM By: Christin Fudge MD, FACS Entered By: Christin Fudge on 11/30/2016 10:36:43 [name] ([id number]) -------------------------------------------------------------------------------- HPI Details Patient Name: [name] Date of Service: 11/30/2016 10:15 AM Medical Record Number: [id number] Patient Account Number: [id number] Date of Birth/Sex: 1945/09/28 (71 y.o. Male) Treating RN: Montey Hora Primary Care Provider: Thomes Lolling Other Clinician: Referring Provider: Thomes Lolling Treating Provider/Extender: Frann Rider in Treatment: 21 History of Present Illness Location: abdomen wall wounds,-- right upper quadrant and epigastric region Quality: Patient reports experiencing a dull pain to affected area(s). Severity: Patient states wound are getting worse. Duration: Patient has had the wound for >5 year's prior to seeking treatment at the wound center Timing: Pain in wound is Intermittent (comes and goes Context: The wound appeared gradually over time Modifying Factors: Consults to this date include:not seen a surgeon and only sees his PCP for his medical complaints Associated Signs and Symptoms: Patient reports having increase discharge. HPI Description: 71 year old gentleman has been  seen recently by his PCP Dr. Thomes Lolling, who sees him with a history of diabetes mellitus, peripheral neuropathy, B12 deficiency, hypertension, osteoarthritis and the patient had recent blood sugar checked which was 163. Past medical history includes essential hypertension, incisional hernia, diabetes mellitus, colon polyps, COPD, tobacco abuse in the past, chewing tobacco, GERD. He has also been treated for chronic back pain with no sciatica and is on oxycodone. His last hemoglobin A1c was 6.9% His abdominal surgery was done at [facility] over 15 years ago and gradually there was sutures which protruded and then there was mesh which protruded. He washes this during his shower but does not put any dressing and the wound is covered with a lot of debris. He has never had a surgical consultation for this. 07/10/2016 -- the patient has spoken to his PCP who said he was going to call me but I have not yet had a phone conversation with the physician. From what I understand the physician was reluctant to have a surgical opinion because of the patient's several comorbidities and he feels that the surgery may be detrimental to his overall health 07/24/2016 -- the patient's daughter is at the bedside and we've had a detailed discussion regarding his options for a surgical opinion and possible surgical intervention before this becomes a emergent situation. She will seek primary care input and take the father to [facility] for a surgical opinion. 07/31/2016 -- the patient's daughter has organized for a surgical opinion at [facility] on June 14 08/14/2016 -- it has been about 3 weeks since he has stopped chewing tobacco. 08/28/2016 -- he was seen at [facility] by Dr. Roby Lofts -- the assessment was based on physical examination and review of his previous history of surgical repair. the patient had undergone a ventral hernia repair in 1998 with Dr. Quay Burow at [facility] at a prior open  cholecystectomy incision. he was evaluated by Dr. Denice Paradise in 2013 and at that  time it was decided to watch and wait. Dr. Payton Doughty recommended a repeat CT scan and requested records from dura and regional operative records and would follow-up the patient, after Mr. Hofferber PCP Dr. Gwenlyn Saran reviewed him for overall fitness for surgery. 09/04/16 patient's wound actually appear to be doing well on evaluation today. He is not really experience [name], [name]. ([id number]) any discomfort and the right abdominal wound is dry and almost appears to be healing up. He continues to have some drainage from the left but this seems to be more of a potential seroma which with milking is able to be cleared out. I discussed with patient that when he performs his dressing changes at home it would be beneficial for him to do this as well. Otherwise there's no evidence of infection. He is still waiting on his CT scan and appointments to determine whether he is going to have another surgery. 09/11/2016 -- the patient has had a CT scan and is awaiting his surgical appointment prior to deciding on any surgical intervention. 09/25/16 patient presents today for fault evaluation concerning his ongoing abdominal surgical wounds. Unfortunately they do not appear to be improving significantly but rather fluctuate from a little better to little worse week by week. Obviously this is somewhat frustrating for him. 10/02/2016 -- the patient has completed his CT scan, and has a review by the surgeons at the end of August. He has a new area which has opened up a little below his left abdominal wound. 10/16/16 on evaluation today patient's wounds on the abdominal region appears to be doing about the same. He actually has an appointment with his surgeon on the 21st for evaluation regarding the required products appointment. Obviously he is having a difficult time with the feeling of these wounds. Fortunately there does not appear to be any  evidence of local infection such as significant amount of erythema or streaking. She also has an audio, vomiting, or diarrhea. He is not have any discomfort at this point. 10/30/2016 -- the surgical review is tomorrow and I am awaiting the input of the surgeon. 11/16/2016 -- I had a call from his surgeon Dr. Payton Doughty, on phone number [phone] who was kind enough to call me regarding his care. After a thorough review and having consulted the abdominal wall reconstruction team they have decided that surgery will be too extensive and rather moribund for this gentleman and have asked him to continue with local care at the wound center. She will send me an official note via Epic. Electronic Signature(s) Signed: 11/30/2016 2:14:22 PM By: Christin Fudge MD, FACS Entered By: Christin Fudge on 11/30/2016 10:36:53 [name] ([id number]) -------------------------------------------------------------------------------- Physical Exam Details Patient Name: [name], [name] Date of Service: 11/30/2016 10:15 AM Medical Record Number: [id number] Patient Account Number: [id number] Date of Birth/Sex: 1945-06-09 (72 y.o. Male) Treating RN: Montey Hora Primary Care Provider: Thomes Lolling Other Clinician: Referring Provider: Thomes Lolling Treating Provider/Extender: Frann Rider in Treatment: 21 Constitutional . Pulse regular. Respirations normal and unlabored. Afebrile. . Eyes Nonicteric. Reactive to light. Ears, Nose, Mouth, and Throat Lips, teeth, and gums WNL.[name] Moist mucosa without lesions. Neck supple and nontender. No palpable supraclavicular or cervical adenopathy. Normal sized without goiter. Respiratory WNL. No retractions.. Cardiovascular Pedal Pulses WNL. No clubbing, cyanosis or edema. Lymphatic No adneopathy. No adenopathy. No adenopathy. Musculoskeletal Adexa without tenderness or enlargement.. Digits and nails w/o clubbing, cyanosis, infection, petechiae, ischemia, or  inflammatory conditions.. Integumentary (Hair, Skin) No suspicious lesions. No crepitus or  fluctuance. No peri-wound warmth or erythema. No masses.[name] Psychiatric Judgement and insight Intact.. No evidence of depression, anxiety, or agitation.. Notes the wound on the right upper quadrant is excellent. The area on the left of the midline in the peri-umbilical region has minimal serous fluid which is draining out of the most inferior wound. Other than that there is no debridement required today. Electronic Signature(s) Signed: 11/30/2016 2:14:22 PM By: Christin Fudge MD, FACS Entered By: Christin Fudge on 11/30/2016 10:37:35 [name] ([id number]) -------------------------------------------------------------------------------- Physician Orders Details Patient Name: [name], [name] Date of Service: 11/30/2016 10:15 AM Medical Record Number: [id number] Patient Account Number: [id number] Date of Birth/Sex: Feb 06, 1946 (71 y.o. Male) Treating RN: Montey Hora Primary Care Provider: Thomes Lolling Other Clinician: Referring Provider: Thomes Lolling Treating Provider/Extender: Frann Rider in Treatment: 69 Verbal / Phone Orders: No Diagnosis Coding Wound Cleansing Wound #1 Right Abdomen - midline o Clean wound with Normal Saline. o May Shower, gently pat wound dry prior to applying new dressing. Wound #2 Left Abdomen - midline o Clean wound with Normal Saline. o May Shower, gently pat wound dry prior to applying new dressing. Anesthetic Wound #1 Right Abdomen - midline o Topical Lidocaine 4% cream applied to wound bed prior to debridement Wound #2 Left Abdomen - midline o Topical Lidocaine 4% cream applied to wound bed prior to debridement Skin Barriers/Peri-Wound Care Wound #1 Right Abdomen - midline o Skin Prep Wound #2 Left Abdomen - midline o Skin Prep Primary Wound Dressing Wound #1 Right Abdomen - midline o Aquacel Ag Wound #2 Left Abdomen -  midline o Aquacel Ag Secondary Dressing Wound #1 Right Abdomen - midline o Other - [place] Wound #2 Left Abdomen - midline o Other - telfa [address] [place] ([id number]) Dressing Change Frequency Wound #1 Right Abdomen - midline o Change dressing every other day. Wound #2 Left Abdomen - midline o Change dressing every other day. Follow-up Appointments Wound #1 Right Abdomen - midline o Return Appointment in 2 weeks. Wound #2 Left Abdomen - midline o Return Appointment in 2 weeks. Additional Orders / Instructions Wound #1 Right Abdomen - midline o Other: - Go see a Engineer, drilling about your hernias and get a surgical opinion. Wound #2 Left Abdomen - midline o Other: - Go see a Engineer, drilling about your hernias and get a surgical opinion. Electronic Signature(s) Signed: 11/30/2016 2:14:22 PM By: Christin Fudge MD, FACS Signed: 11/30/2016 4:38:09 PM By: Montey Hora Entered By: Montey Hora on 11/30/2016 10:24:07 [name] ([id number]) -------------------------------------------------------------------------------- Problem List Details Patient Name: [name], [name] Date of Service: 11/30/2016 10:15 AM Medical Record Number: [id number] Patient Account Number: [id number] Date of Birth/Sex: 28-Nov-1945 (71 y.o. Male) Treating RN: Montey Hora Primary Care Provider: Thomes Lolling Other Clinician: Referring Provider: Thomes Lolling Treating Provider/Extender: Frann Rider in Treatment: 21 Active Problems ICD-10 Encounter Code Description Active Date Diagnosis E11.622 Type 2 diabetes mellitus with other skin ulcer 07/03/2016 Yes S31.100A Unspecified open wound of abdominal wall, right upper 07/03/2016 Yes quadrant without penetration into peritoneal cavity, initial encounter S31.102A Unspecified open wound of abdominal wall, epigastric 07/03/2016 Yes region without penetration into peritoneal cavity, initial encounter F17.228 Nicotine  dependence, chewing tobacco, with other 07/03/2016 Yes nicotine-induced disorders Inactive Problems Resolved Problems Electronic Signature(s) Signed: 11/30/2016 2:14:22 PM By: Christin Fudge MD, FACS Entered By: Christin Fudge on 11/30/2016 10:36:31 [name] ([id number]) -------------------------------------------------------------------------------- Progress Note Details Patient Name: [name] Date of Service: 11/30/2016 10:15 AM Medical Record Number:  [id number] Patient Account Number: [id number] Date of Birth/Sex: 06-12-1945 (71 y.o. Male) Treating RN: Montey Hora Primary Care Provider: Thomes Lolling Other Clinician: Referring Provider: Thomes Lolling Treating Provider/Extender: Frann Rider in Treatment: 21 Subjective Chief Complaint Information obtained from Patient Patients presents for treatment of an open diabetic ulcer to the anterior abdominal wall in the epigastric and right upper quadrant History of Present Illness (HPI) The following HPI elements were documented for the patient's wound: Location: abdomen wall wounds,-- right upper quadrant and epigastric region Quality: Patient reports experiencing a dull pain to affected area(s). Severity: Patient states wound are getting worse. Duration: Patient has had the wound for >5 year's prior to seeking treatment at the wound center Timing: Pain in wound is Intermittent (comes and goes Context: The wound appeared gradually over time Modifying Factors: Consults to this date include:not seen a surgeon and only sees his PCP for his medical complaints Associated Signs and Symptoms: Patient reports having increase discharge. 71 year old gentleman has been seen recently by his PCP Dr. Thomes Lolling, who sees him with a history of diabetes mellitus, peripheral neuropathy, B12 deficiency, hypertension, osteoarthritis and the patient had recent blood sugar checked which was 163. Past medical history includes essential  hypertension, incisional hernia, diabetes mellitus, colon polyps, COPD, tobacco abuse in the past, chewing tobacco, GERD. He has also been treated for chronic back pain with no sciatica and is on oxycodone. His last hemoglobin A1c was 6.9% His abdominal surgery was done at [facility] over 15 years ago and gradually there was sutures which protruded and then there was mesh which protruded. He washes this during his shower but does not put any dressing and the wound is covered with a lot of debris. He has never had a surgical consultation for this. 07/10/2016 -- the patient has spoken to his PCP who said he was going to call me but I have not yet had a phone conversation with the physician. From what I understand the physician was reluctant to have a surgical opinion because of the patient's several comorbidities and he feels that the surgery may be detrimental to his overall health 07/24/2016 -- the patient's daughter is at the bedside and we've had a detailed discussion regarding his options for a surgical opinion and possible surgical intervention before this becomes a emergent situation. She will seek primary care input and take the father to [facility] for a surgical opinion. 07/31/2016 -- the patient's daughter has organized for a surgical opinion at [facility] on June 14 08/14/2016 -- it has been about 3 weeks since he has stopped chewing tobacco. [name], [name] ([id number]) 08/28/2016 -- he was seen at [facility] by Dr. Roby Lofts -- the assessment was based on physical examination and review of his previous history of surgical repair. the patient had undergone a ventral hernia repair in 1998 with Dr. Quay Burow at [facility] at a prior open cholecystectomy incision. he was evaluated by Dr. Denice Paradise in 2013 and at that time it was decided to watch and wait. Dr. Payton Doughty recommended a repeat CT scan and requested records from dura and regional operative records and would follow-up  the patient, after Mr. Sonnenberg PCP Dr. Gwenlyn Saran reviewed him for overall fitness for surgery. 09/04/16 patient's wound actually appear to be doing well on evaluation today. He is not really experience any discomfort and the right abdominal wound is dry and almost appears to be healing up. He continues to have some drainage from the left but this  seems to be more of a potential seroma which with milking is able to be cleared out. I discussed with patient that when he performs his dressing changes at home it would be beneficial for him to do this as well. Otherwise there's no evidence of infection. He is still waiting on his CT scan and appointments to determine whether he is going to have another surgery. 09/11/2016 -- the patient has had a CT scan and is awaiting his surgical appointment prior to deciding on any surgical intervention. 09/25/16 patient presents today for fault evaluation concerning his ongoing abdominal surgical wounds. Unfortunately they do not appear to be improving significantly but rather fluctuate from a little better to little worse week by week. Obviously this is somewhat frustrating for him. 10/02/2016 -- the patient has completed his CT scan, and has a review by the surgeons at the end of August. He has a new area which has opened up a little below his left abdominal wound. 10/16/16 on evaluation today patient's wounds on the abdominal region appears to be doing about the same. He actually has an appointment with his surgeon on the 21st for evaluation regarding the required products appointment. Obviously he is having a difficult time with the feeling of these wounds. Fortunately there does not appear to be any evidence of local infection such as significant amount of erythema or streaking. She also has an audio, vomiting, or diarrhea. He is not have any discomfort at this point. 10/30/2016 -- the surgical review is tomorrow and I am awaiting the input of the  surgeon. 11/16/2016 -- I had a call from his surgeon Dr. Payton Doughty, on phone number [phone] who was kind enough to call me regarding his care. After a thorough review and having consulted the abdominal wall reconstruction team they have decided that surgery will be too extensive and rather moribund for this gentleman and have asked him to continue with local care at the wound center. She will send me an official note via Epic. Objective Constitutional [name], [name]. ([id number]) Pulse regular. Respirations normal and unlabored. Afebrile. Vitals Time Taken: 10:10 AM, Height: 65 in, Weight: 161 lbs, BMI: 26.8, Temperature: 97.7 F, Pulse: 65 bpm, Respiratory Rate: 16 breaths/min, Blood Pressure: 120/67 mmHg. Eyes Nonicteric. Reactive to light. Ears, Nose, Mouth, and Throat Lips, teeth, and gums WNL.[name] Moist mucosa without lesions. Neck supple and nontender. No palpable supraclavicular or cervical adenopathy. Normal sized without goiter. Respiratory WNL. No retractions.. Cardiovascular Pedal Pulses WNL. No clubbing, cyanosis or edema. Lymphatic No adneopathy. No adenopathy. No adenopathy. Musculoskeletal Adexa without tenderness or enlargement.. Digits and nails w/o clubbing, cyanosis, infection, petechiae, ischemia, or inflammatory conditions.[name] Psychiatric Judgement and insight Intact.. No evidence of depression, anxiety, or agitation.. General Notes: the wound on the right upper quadrant is excellent. The area on the left of the midline in the peri-umbilical region has minimal serous fluid which is draining out of the most inferior wound. Other than that there is no debridement required today. Integumentary (Hair, Skin) No suspicious lesions. No crepitus or fluctuance. No peri-wound warmth or erythema. No masses.. Wound #1 status is Open. Original cause of wound was Gradually Appeared. The wound is located on the Right Abdomen - midline. The wound measures 0.1cm length x 0.1cm  width x 0.1cm depth; 0.008cm^2 area and 0.001cm^3 volume. The wound is limited to skin breakdown. There is no tunneling or undermining noted. There is a medium amount of serosanguineous drainage noted. The wound margin is flat and intact. There is  no granulation within the wound bed. There is a large (67-100%) amount of necrotic tissue within the wound bed including Eschar. The periwound skin appearance exhibited: Scarring. The periwound skin appearance did not exhibit: Callus, Crepitus, Excoriation, Induration, Rash, Dry/Scaly, Maceration, Atrophie Blanche, Cyanosis, Ecchymosis, Hemosiderin Staining, Mottled, Pallor, Rubor, Erythema. Periwound temperature was noted as No Abnormality. Wound #2 status is Open. Original cause of wound was Gradually Appeared. The wound is located on the Left Abdomen - midline. The wound measures 5.3cm length x 0.5cm width x 0.1cm depth; 2.081cm^2 area [name], Michael O. ([id number]) and 0.208cm^3 volume. The wound is limited to skin breakdown. There is no tunneling or undermining noted. There is a large amount of purulent drainage noted. The wound margin is flat and intact. There is medium (34-66%) red granulation within the wound bed. There is a medium (34-66%) amount of necrotic tissue within the wound bed including Eschar and Adherent Slough. The periwound skin appearance exhibited: Induration, Scarring, Ecchymosis. The periwound skin appearance did not exhibit: Callus, Crepitus, Excoriation, Rash, Dry/Scaly, Maceration, Atrophie Blanche, Cyanosis, Hemosiderin Staining, Mottled, Pallor, Rubor, Erythema. Periwound temperature was noted as No Abnormality. Assessment Active Problems ICD-10 E11.622 - Type 2 diabetes mellitus with other skin ulcer S31.100A - Unspecified open wound of abdominal wall, right upper quadrant without penetration into peritoneal cavity, initial encounter S31.102A - Unspecified open wound of abdominal wall, epigastric region without  penetration into peritoneal cavity, initial encounter F17.228 - Nicotine dependence, chewing tobacco, with other nicotine-induced disorders Plan Wound Cleansing: Wound #1 Right Abdomen - midline: Clean wound with Normal Saline. May Shower, gently pat wound dry prior to applying new dressing. Wound #2 Left Abdomen - midline: Clean wound with Normal Saline. May Shower, gently pat wound dry prior to applying new dressing. Anesthetic: Wound #1 Right Abdomen - midline: Topical Lidocaine 4% cream applied to wound bed prior to debridement Wound #2 Left Abdomen - midline: Topical Lidocaine 4% cream applied to wound bed prior to debridement Skin Barriers/Peri-Wound Care: Wound #1 Right Abdomen - midline: Skin Prep Wound #2 Left Abdomen - midline: Skin Prep Primary Wound Dressing: Wound #1 Right Abdomen - midline: Aquacel Ag [name], [name] ([id number]) Wound #2 Left Abdomen - midline: Aquacel Ag Secondary Dressing: Wound #1 Right Abdomen - midline: Other - [place] Wound #2 Left Abdomen - midline: Other - telfa [place] Dressing Change Frequency: Wound #1 Right Abdomen - midline: Change dressing every other day. Wound #2 Left Abdomen - midline: Change dressing every other day. Follow-up Appointments: Wound #1 Right Abdomen - midline: Return Appointment in 2 weeks. Wound #2 Left Abdomen - midline: Return Appointment in 2 weeks. Additional Orders / Instructions: Wound #1 Right Abdomen - midline: Other: - Go see a Engineer, drilling about your hernias and get a surgical opinion. Wound #2 Left Abdomen - midline: Other: - Go see a Engineer, drilling about your hernias and get a surgical opinion. the patient has no surgical options and is on palliative care with as regarding his issue with abdominal wounds caused by protruding mesh from a previous hernia repair. He will see me every 2-3 weeks, and I have recommended we continue to use silver alginate locally and have discussed wound care in  detail and we will see him back in 2 weeks' time. Electronic Signature(s) Signed: 11/30/2016 2:14:22 PM By: Christin Fudge MD, FACS Entered By: Christin Fudge on 11/30/2016 10:39:24 [name] ([id number]) -------------------------------------------------------------------------------- SuperBill Details Patient Name: [name] Date of Service: 11/30/2016 Medical Record Number: [id number] Patient Account  Number: [id number] Date of Birth/Sex: Jan 14, 1946 (71 y.o. Male) Treating RN: Montey Hora Primary Care Provider: Thomes Lolling Other Clinician: Referring Provider: Thomes Lolling Treating Provider/Extender: Frann Rider in Treatment: 21 Diagnosis Coding ICD-10 Codes Code Description E11.622 Type 2 diabetes mellitus with other skin ulcer Unspecified open wound of abdominal wall, right upper quadrant without penetration into S31.100A peritoneal cavity, initial encounter Unspecified open wound of abdominal wall, epigastric region without penetration into S31.102A peritoneal cavity, initial encounter F17.228 Nicotine dependence, chewing tobacco, with other nicotine-induced disorders Facility Procedures CPT4 Code: 14782956 Description: 99213 - WOUND CARE VISIT-LEV 3 EST PT Modifier: Quantity: 1 Physician Procedures CPT4: Description Modifier Quantity Code 2130865 99213 - WC PHYS LEVEL 3 - EST PT 1 ICD-10 Description Diagnosis E11.622 Type 2 diabetes mellitus with other skin ulcer S31.100A Unspecified open wound of abdominal wall, right upper quadrant without  penetration into peritoneal cavity, initial encounter S31.102A Unspecified open wound of abdominal wall, epigastric region without penetration into peritoneal cavity, initial encounter F17.228 Nicotine dependence, chewing tobacco, with other  nicotine-induced disorders Electronic Signature(s) Signed: 12/01/2016 4:09:30 PM By: Christin Fudge MD, FACS Previous Signature: 11/30/2016 2:14:22 PM Version By: Christin Fudge  MD, FACS Entered By: Sharon Mt on 12/01/2016 10:12:42

## 2016-12-21 ENCOUNTER — Ambulatory Visit: Payer: Medicare Other | Admitting: Surgery

## 2016-12-22 ENCOUNTER — Encounter: Payer: Medicare Other | Attending: Surgery | Admitting: Surgery

## 2016-12-22 ENCOUNTER — Ambulatory Visit
Admission: RE | Admit: 2016-12-22 | Discharge: 2016-12-22 | Payer: MEDICARE | Attending: Audiologist | Admitting: Audiologist

## 2016-12-22 DIAGNOSIS — H903 Sensorineural hearing loss, bilateral: Principal | ICD-10-CM

## 2016-12-22 DIAGNOSIS — E1142 Type 2 diabetes mellitus with diabetic polyneuropathy: Secondary | ICD-10-CM | POA: Diagnosis not present

## 2016-12-22 DIAGNOSIS — E11622 Type 2 diabetes mellitus with other skin ulcer: Secondary | ICD-10-CM | POA: Diagnosis present

## 2016-12-22 DIAGNOSIS — E538 Deficiency of other specified B group vitamins: Secondary | ICD-10-CM | POA: Diagnosis not present

## 2016-12-22 DIAGNOSIS — S31100A Unspecified open wound of abdominal wall, right upper quadrant without penetration into peritoneal cavity, initial encounter: Secondary | ICD-10-CM | POA: Diagnosis not present

## 2016-12-22 DIAGNOSIS — F17228 Nicotine dependence, chewing tobacco, with other nicotine-induced disorders: Secondary | ICD-10-CM | POA: Diagnosis not present

## 2016-12-22 DIAGNOSIS — J449 Chronic obstructive pulmonary disease, unspecified: Secondary | ICD-10-CM | POA: Insufficient documentation

## 2016-12-22 DIAGNOSIS — Z79891 Long term (current) use of opiate analgesic: Secondary | ICD-10-CM | POA: Diagnosis not present

## 2016-12-22 DIAGNOSIS — X58XXXA Exposure to other specified factors, initial encounter: Secondary | ICD-10-CM | POA: Insufficient documentation

## 2016-12-22 DIAGNOSIS — G8929 Other chronic pain: Secondary | ICD-10-CM | POA: Diagnosis not present

## 2016-12-22 DIAGNOSIS — Z79899 Other long term (current) drug therapy: Secondary | ICD-10-CM | POA: Insufficient documentation

## 2016-12-22 DIAGNOSIS — K219 Gastro-esophageal reflux disease without esophagitis: Secondary | ICD-10-CM | POA: Insufficient documentation

## 2016-12-22 DIAGNOSIS — M549 Dorsalgia, unspecified: Secondary | ICD-10-CM | POA: Diagnosis not present

## 2016-12-22 DIAGNOSIS — I10 Essential (primary) hypertension: Secondary | ICD-10-CM | POA: Insufficient documentation

## 2016-12-22 DIAGNOSIS — M1990 Unspecified osteoarthritis, unspecified site: Secondary | ICD-10-CM | POA: Insufficient documentation

## 2016-12-22 DIAGNOSIS — S31102A Unspecified open wound of abdominal wall, epigastric region without penetration into peritoneal cavity, initial encounter: Secondary | ICD-10-CM | POA: Diagnosis not present

## 2016-12-22 DIAGNOSIS — Z7984 Long term (current) use of oral hypoglycemic drugs: Secondary | ICD-10-CM | POA: Diagnosis not present

## 2016-12-23 NOTE — Progress Notes (Signed)
JOLLY, BLEICHER ([id number]) Visit Report for 12/22/2016 Arrival Information Details Patient Name: [name], [name]. Date of Service: 12/22/2016 9:45 AM Medical Record Number: [id number] Patient Account Number: [id number] Date of Birth/Sex: 03-26-1945 (71 y.o. Male) Treating RN: Ahmed Prima Primary Care Raymar Joiner: Thomes Lolling Other Clinician: Referring Aaria Happ: Thomes Lolling Treating Ruthella Kirchman/Extender: Frann Rider in Treatment: 24 Visit Information History Since Last Visit All ordered tests and consults were completed: No Patient Arrived: Ambulatory Added or deleted any medications: No Arrival Time: 08:47 Any new allergies or adverse reactions: No Accompanied By: self Had a fall or experienced change in No Transfer Assistance: None activities of daily living that may affect Patient Identification Verified: Yes risk of falls: Secondary Verification Process Yes Signs or symptoms of abuse/neglect since last No Completed: visito Patient Requires Transmission-Based No Hospitalized since last visit: No Precautions: Has Dressing in Place as Prescribed: Yes Patient Has Alerts: Yes Pain Present Now: No Patient Alerts: DMII Electronic Signature(s) Signed: 12/22/2016 9:47:23 AM By: Alric Quan Entered By: Alric Quan on 12/22/2016 08:50:31 [name] ([id number]) -------------------------------------------------------------------------------- Encounter Discharge Information Details Patient Name: [name] Date of Service: 12/22/2016 9:45 AM Medical Record Number: [id number] Patient Account Number: [id number] Date of Birth/Sex: 11-17-1945 (71 y.o. Male) Treating RN: Ahmed Prima Primary Care Tieara Flitton: Thomes Lolling Other Clinician: Referring Venus Gilles: Thomes Lolling Treating Tuere Nwosu/Extender: Frann Rider in Treatment: 24 Encounter Discharge Information Items Discharge Pain Level: 0 Discharge Condition: Stable Ambulatory Status:  Ambulatory Discharge Destination: Home Transportation: Private Auto Accompanied By: self Schedule Follow-up Appointment: Yes Medication Reconciliation completed and provided to Patient/Care No Larosa Rhines: Provided on Clinical Summary of Care: 12/22/2016 Form Type Recipient Paper Patient FB Electronic Signature(s) Signed: 12/22/2016 9:47:23 AM By: Alric Quan Entered By: Alric Quan on 12/22/2016 09:15:22 [name] ([id number]) -------------------------------------------------------------------------------- Lower Extremity Assessment Details Patient Name: [name] Date of Service: 12/22/2016 9:45 AM Medical Record Number: [id number] Patient Account Number: [id number] Date of Birth/Sex: 10/18/1945 (71 y.o. Male) Treating RN: Ahmed Prima Primary Care Birl Lobello: Thomes Lolling Other Clinician: Referring Sidhant Helderman: Thomes Lolling Treating Yuktha Kerchner/Extender: Frann Rider in Treatment: 24 Electronic Signature(s) Signed: 12/22/2016 9:47:23 AM By: Alric Quan Entered By: Alric Quan on 12/22/2016 08:55:36 [name] ([id number]) -------------------------------------------------------------------------------- Multi Wound Chart Details Patient Name: [name] Date of Service: 12/22/2016 9:45 AM Medical Record Number: [id number] Patient Account Number: [id number] Date of Birth/Sex: 1946-01-27 (71 y.o. Male) Treating RN: Ahmed Prima Primary Care Maude Hettich: Thomes Lolling Other Clinician: Referring Donnalynn Wheeless: Thomes Lolling Treating Fahed Morten/Extender: Frann Rider in Treatment: 24 Vital Signs Height(in): 65 Pulse(bpm): 74 Weight(lbs): 161 Blood Pressure 160/82 (mmHg): Body Mass Index(BMI): 27 Temperature(F): 97.7 Respiratory Rate 18 (breaths/min): Photos: [1:No Photos] [2:No Photos] [N/A:N/A] Wound Location: [1:Right Abdomen - midline] [2:Left Abdomen - midline] [N/A:N/A] Wounding Event: [1:Gradually Appeared]  [2:Gradually Appeared] [N/A:N/A] Primary Etiology: [1:Atypical] [2:Atypical] [N/A:N/A] Comorbid History: [1:Cataracts, Chronic Obstructive Pulmonary Disease (COPD), Hypertension, Type II Diabetes, Osteoarthritis, Neuropathy] [2:Cataracts, Chronic Obstructive Pulmonary Disease (COPD), Hypertension, Type II Diabetes, Osteoarthritis,  Neuropathy] [N/A:N/A] Date Acquired: [1:05/17/2015] [2:05/17/2015] [N/A:N/A] Weeks of Treatment: [1:24] [2:24] [N/A:N/A] Wound Status: [1:Open] [2:Open] [N/A:N/A] Clustered Wound: [1:No] [2:Yes] [N/A:N/A] Clustered Quantity: [1:N/A] [2:3] [N/A:N/A] Measurements L x W x D 0.1x0.1x0.1 [2:5.3x0.5x0.1] [N/A:N/A] (cm) Area (cm) : [1:0.008] [2:2.081] [N/A:N/A] Volume (cm) : [1:0.001] [2:0.208] [N/A:N/A] % Reduction in Area: [1:99.80%] [2:60.60%] [N/A:N/A] % Reduction in Volume: 99.70% [2:60.60%] [N/A:N/A] Classification: [1:Full Thickness Without Exposed Support Structures] [2:Full Thickness Without Exposed Support Structures] [N/A:N/A] Exudate Amount: [1:Medium] [2:Large] [N/A:N/A] Exudate Type: [1:Serosanguineous] [2:Purulent] [  N/A:N/A] Exudate Color: [1:red, [place]] [2:yellow, [place], green] [N/A:N/A] Wound Margin: [1:Flat and Intact] [2:Flat and Intact] [N/A:N/A] Granulation Amount: [1:None Present (0%)] [2:Medium (34-66%)] [N/A:N/A] Granulation Quality: [1:N/A] [2:Red] [N/A:N/A] Necrotic Amount: Large (67-100%) Medium (34-66%) N/A Necrotic Tissue: Eschar Eschar, Adherent Slough N/A Exposed Structures: Fascia: No Fascia: No N/A Fat Layer (Subcutaneous Fat Layer (Subcutaneous Tissue) Exposed: No Tissue) Exposed: No Tendon: No Tendon: No Muscle: No Muscle: No Joint: No Joint: No Bone: No Bone: No Limited to Skin Limited to Skin Breakdown Breakdown Epithelialization: Medium (34-66%) Small (1-33%) N/A Debridement: Debridement (78295- Debridement (11042- N/A 11047) 11047) Pre-procedure 09:03 09:03 N/A Verification/Time Out Taken: Periwound Skin  Texture: Scarring: Yes Induration: Yes N/A Excoriation: No Scarring: Yes Induration: No Excoriation: No Callus: No Callus: No Crepitus: No Crepitus: No Rash: No Rash: No Periwound Skin Maceration: No Maceration: No N/A Moisture: Dry/Scaly: No Dry/Scaly: No Periwound Skin Color: Atrophie Blanche: No Ecchymosis: Yes N/A Cyanosis: No Atrophie Blanche: No Ecchymosis: No Cyanosis: No Erythema: No Erythema: No Hemosiderin Staining: No Hemosiderin Staining: No Mottled: No Mottled: No Pallor: No Pallor: No Rubor: No Rubor: No Temperature: No Abnormality No Abnormality N/A Tenderness on No No N/A Palpation: Wound Preparation: Ulcer Cleansing: Ulcer Cleansing: N/A Rinsed/Irrigated with Rinsed/Irrigated with Saline Saline Topical Anesthetic Topical Anesthetic Applied: Other: lidocaine Applied: Other: lidocaine 4% 4% Procedures Performed: Debridement Debridement N/A Treatment Notes Wound #1 (Right Abdomen - midline) 1. Cleansed with: Clean wound with Normal Saline [name], [name]. ([id number]) 2. Anesthetic Topical Lidocaine 4% cream to wound bed prior to debridement 4. Dressing Applied: Other dressing (specify in notes) 5. Secondary [place] Notes [facility] Wound #2 (Left Abdomen - midline) 1. Cleansed with: Clean wound with Normal Saline 2. Anesthetic Topical Lidocaine 4% cream to wound bed prior to debridement 4. Dressing Applied: Other dressing (specify in notes) 5. Secondary [place] Notes [facility] Electronic Signature(s) Signed: 12/22/2016 9:25:12 AM By: Christin Fudge MD, FACS Entered By: Christin Fudge on 12/22/2016 09:25:12 [name] ([id number]) -------------------------------------------------------------------------------- [place] Details Patient Name: [name], [name] Date of Service: 12/22/2016 9:45 AM Medical Record Number: [id number] Patient Account Number: [id number] Date of  Birth/Sex: Apr 12, 1945 (71 y.o. Male) Treating RN: Ahmed Prima Primary Care Luchiano Viscomi: Thomes Lolling Other Clinician: Referring Dineen Conradt: Thomes Lolling Treating Ladaija Dimino/Extender: Frann Rider in Treatment: 24 Active Inactive ` Abuse / Safety / Falls / Self Care Management Nursing Diagnoses: Impaired physical mobility Goals: Patient will remain injury free Date Initiated: 07/03/2016 Target Resolution Date: 09/14/2016 Goal Status: Active Interventions: Assess fall risk on admission and as needed Notes: ` Orientation to the Wound Care Program Nursing Diagnoses: Knowledge deficit related to the wound healing center program Goals: Patient/caregiver will verbalize understanding of the [place] Date Initiated: 07/03/2016 Target Resolution Date: 09/15/2016 Goal Status: Active Interventions: Provide education on orientation to the wound center Notes: ` Wound/Skin Impairment Nursing Diagnoses: Impaired tissue integrity [name], [name] ([id number]) Goals: Patient/caregiver will verbalize understanding of skin care regimen Date Initiated: 07/03/2016 Target Resolution Date: 09/15/2016 Goal Status: Active Ulcer/skin breakdown will have a volume reduction of 30% by week 4 Date Initiated: 07/03/2016 Target Resolution Date: 09/15/2016 Goal Status: Active Ulcer/skin breakdown will have a volume reduction of 50% by week 8 Date Initiated: 07/03/2016 Target Resolution Date: 09/15/2016 Goal Status: Active Ulcer/skin breakdown will have a volume reduction of 80% by week 12 Date Initiated: 07/03/2016 Target Resolution Date: 09/15/2016 Goal Status: Active Ulcer/skin breakdown will heal within 14 weeks Date Initiated: 07/03/2016 Target Resolution  Date: 09/15/2016 Goal Status: Active Interventions: Assess patient/caregiver ability to obtain necessary supplies Assess patient/caregiver ability to perform ulcer/skin care regimen upon admission and as needed Assess ulceration(s)  every visit Notes: Electronic Signature(s) Signed: 12/22/2016 9:47:23 AM By: Alric Quan Entered By: Alric Quan on 12/22/2016 08:56:54 [name] ([id number]) -------------------------------------------------------------------------------- Pain Assessment Details Patient Name: [name] Date of Service: 12/22/2016 9:45 AM Medical Record Number: [id number] Patient Account Number: [id number] Date of Birth/Sex: 10/17/1945 (71 y.o. Male) Treating RN: Ahmed Prima Primary Care Delaine Canter: Thomes Lolling Other Clinician: Referring Rafay Dahan: Thomes Lolling Treating Lindia Garms/Extender: Frann Rider in Treatment: 24 Active Problems Location of Pain Severity and Description of Pain Patient Has Paino No Site Locations Pain Management and Medication Current Pain Management: Electronic Signature(s) Signed: 12/22/2016 9:47:23 AM By: Alric Quan Entered By: Alric Quan on 12/22/2016 08:50:37 [name] ([id number]) -------------------------------------------------------------------------------- Patient/Caregiver Education Details Patient Name: [name] Date of Service: 12/22/2016 9:45 AM Medical Record Number: [id number] Patient Account Number: [id number] Date of Birth/Gender: April 14, 1945 (71 y.o. Male) Treating RN: Ahmed Prima Primary Care Physician: Thomes Lolling Other Clinician: Referring Physician: Thomes Lolling Treating Physician/Extender: Frann Rider in Treatment: 24 Education Assessment Education Provided To: Patient Education Topics Provided Wound/Skin Impairment: Handouts: Other: change dressing as ordered Methods: Demonstration, Explain/Verbal Responses: State content correctly Electronic Signature(s) Signed: 12/22/2016 9:47:23 AM By: Alric Quan Entered By: Alric Quan on 12/22/2016 09:15:33 [name]  ([id number]) -------------------------------------------------------------------------------- Wound Assessment Details Patient Name: [name] Date of Service: 12/22/2016 9:45 AM Medical Record Number: [id number] Patient Account Number: [id number] Date of Birth/Sex: [birth year] (71 y.o. Male) Treating RN: Carolyne Fiscal, Debi Primary Care Robin Pafford: Thomes Lolling Other Clinician: Referring Erle Guster: Thomes Lolling Treating Donesha Wallander/Extender: Frann Rider in Treatment: 24 Wound Status Wound Number: 1 Primary Atypical Etiology: Wound Location: Right Abdomen - midline Wound Open Wounding Event: Gradually Appeared Status: Date Acquired: 05/17/2015 Comorbid Cataracts, Chronic Obstructive Weeks Of Treatment: 24 History: Pulmonary Disease (COPD), Clustered Wound: No Hypertension, Type II Diabetes, Osteoarthritis, Neuropathy Photos Photo Uploaded By: Alric Quan on 12/22/2016 09:49:24 Wound Measurements Length: (cm) 0.1 Width: (cm) 0.1 Depth: (cm) 0.1 Area: (cm) 0.008 Volume: (cm) 0.001 % Reduction in Area: 99.8% % Reduction in Volume: 99.7% Epithelialization: Medium (34-66%) Tunneling: No Undermining: No Wound Description Full Thickness Without Exposed Classification: Support Structures Wound Margin: Flat and Intact Exudate Medium Amount: Exudate Type: Serosanguineous Exudate Color: red, Reeder Foul Odor After Cleansing: No Slough/Fibrino No Wound Bed Granulation Amount: None Present (0%) Exposed Structure [name], [name] ([id number]) Necrotic Amount: Large (67-100%) Fascia Exposed: No Necrotic Quality: Eschar Fat Layer (Subcutaneous Tissue) Exposed: No Tendon Exposed: No Muscle Exposed: No Joint Exposed: No Bone Exposed: No Limited to Skin Breakdown Periwound Skin Texture Texture Color No Abnormalities Noted: No No Abnormalities Noted: No Callus: No Atrophie Blanche: No Crepitus: No Cyanosis: No Excoriation: No Ecchymosis: No Induration:  No Erythema: No Rash: No Hemosiderin Staining: No Scarring: Yes Mottled: No Pallor: No Moisture Rubor: No No Abnormalities Noted: No Dry / Scaly: No Temperature / Pain Maceration: No Temperature: No Abnormality Wound Preparation Ulcer Cleansing: Rinsed/Irrigated with Saline Topical Anesthetic Applied: Other: lidocaine 4%, Treatment Notes Wound #1 (Right Abdomen - midline) 1. Cleansed with: Clean wound with Normal Saline 2. Anesthetic Topical Lidocaine 4% cream to wound bed prior to debridement 4. Dressing Applied: Other dressing (specify in notes) 5. Secondary [place] Notes [facility] Electronic Signature(s) Signed: 12/22/2016 9:47:23 AM By: Alric Quan Entered By: Alric Quan on 12/22/2016 08:56:32 [name], Michael O. (  [id number]) -------------------------------------------------------------------------------- Wound Assessment Details Patient Name: [name], [name]. Date of Service: 12/22/2016 9:45 AM Medical Record Number: [id number] Patient Account Number: [id number] Date of Birth/Sex: [birth year] (71 y.o. Male) Treating RN: Carolyne Fiscal, Debi Primary Care Dereon Corkery: Thomes Lolling Other Clinician: Referring Jonia Oakey: Thomes Lolling Treating Jonea Bukowski/Extender: Frann Rider in Treatment: 24 Wound Status Wound Number: 2 Primary Atypical Etiology: Wound Location: Left Abdomen - midline Wound Open Wounding Event: Gradually Appeared Status: Date Acquired: 05/17/2015 Comorbid Cataracts, Chronic Obstructive Weeks Of Treatment: 24 History: Pulmonary Disease (COPD), Clustered Wound: Yes Hypertension, Type II Diabetes, Osteoarthritis, Neuropathy Photos Photo Uploaded By: Alric Quan on 12/22/2016 09:49:24 Wound Measurements Length: (cm) 5.3 Width: (cm) 0.5 Depth: (cm) 0.1 Clustered Quantity: 3 Area: (cm) 2.081 Volume: (cm) 0.208 % Reduction in Area: 60.6% % Reduction in Volume: 60.6% Epithelialization: Small  (1-33%) Tunneling: No Undermining: No Wound Description Full Thickness Without Exposed Foul Odor Afte Classification: Support Structures Slough/Fibrino Wound Margin: Flat and Intact Exudate Large Amount: Exudate Type: Purulent Exudate Color: yellow, [place], green r Cleansing: No No Wound Bed [name], [name] ([id number]) Granulation Amount: Medium (34-66%) Exposed Structure Granulation Quality: Red Fascia Exposed: No Necrotic Amount: Medium (34-66%) Fat Layer (Subcutaneous Tissue) Exposed: No Necrotic Quality: Eschar, Adherent Slough Tendon Exposed: No Muscle Exposed: No Joint Exposed: No Bone Exposed: No Limited to Skin Breakdown Periwound Skin Texture Texture Color No Abnormalities Noted: No No Abnormalities Noted: No Callus: No Atrophie Blanche: No Crepitus: No Cyanosis: No Excoriation: No Ecchymosis: Yes Induration: Yes Erythema: No Rash: No Hemosiderin Staining: No Scarring: Yes Mottled: No Pallor: No Moisture Rubor: No No Abnormalities Noted: No Dry / Scaly: No Temperature / Pain Maceration: No Temperature: No Abnormality Wound Preparation Ulcer Cleansing: Rinsed/Irrigated with Saline Topical Anesthetic Applied: Other: lidocaine 4%, Treatment Notes Wound #2 (Left Abdomen - midline) 1. Cleansed with: Clean wound with Normal Saline 2. Anesthetic Topical Lidocaine 4% cream to wound bed prior to debridement 4. Dressing Applied: Other dressing (specify in notes) 5. Secondary [place] Notes [facility] Electronic Signature(s) Signed: 12/22/2016 9:47:23 AM By: Alric Quan Entered By: Alric Quan on 12/22/2016 08:56:45 [name] ([id number]) -------------------------------------------------------------------------------- [place] Details Patient Name: [name] Date of Service: 12/22/2016 9:45 AM Medical Record Number: [id number] Patient Account Number: [id number] Date of Birth/Sex: Jul 26, 1945 (71 y.o.  Male) Treating RN: Carolyne Fiscal, Debi Primary Care Jalisia Puchalski: Thomes Lolling Other Clinician: Referring Marbin Olshefski: Thomes Lolling Treating Liviah Cake/Extender: Frann Rider in Treatment: 24 Vital Signs Time Taken: 08:50 Temperature (F): 97.7 Height (in): 65 Pulse (bpm): 74 Weight (lbs): 161 Respiratory Rate (breaths/min): 18 Body Mass Index (BMI): 26.8 Blood Pressure (mmHg): 160/82 Reference Range: 80 - 120 mg / dl Electronic Signature(s) Signed: 12/22/2016 9:47:23 AM By: Alric Quan Entered By: Alric Quan on 12/22/2016 08:50:58

## 2016-12-24 NOTE — Progress Notes (Signed)
LYNDEN, FLEMMER ([id number]) Visit Report for 12/22/2016 Chief Complaint Document Details Patient Name: [name], [name]. Date of Service: 12/22/2016 9:45 AM Medical Record Number: [id number] Patient Account Number: [id number] Date of Birth/Sex: 1945/03/31 (71 y.o. Male) Treating RN: Ahmed Prima Primary Care Provider: Thomes Lolling Other Clinician: Referring Provider: Thomes Lolling Treating Provider/Extender: Frann Rider in Treatment: 24 Information Obtained from: Patient Chief Complaint Patients presents for treatment of an open diabetic ulcer to the anterior abdominal wall in the epigastric and right upper quadrant Electronic Signature(s) Signed: 12/22/2016 9:28:51 AM By: Christin Fudge MD, FACS Entered By: Christin Fudge on 12/22/2016 09:28:51 [name] ([id number]) -------------------------------------------------------------------------------- Debridement Details Patient Name: [name] Date of Service: 12/22/2016 9:45 AM Medical Record Number: [id number] Patient Account Number: [id number] Date of Birth/Sex: 1945-08-07 (71 y.o. Male) Treating RN: Ahmed Prima Primary Care Provider: Thomes Lolling Other Clinician: Referring Provider: Thomes Lolling Treating Provider/Extender: Frann Rider in Treatment: 24 Debridement Performed for Wound #1 Right Abdomen - midline Assessment: Performed By: Physician Christin Fudge, MD Debridement: Debridement Pre-procedure Verification/Time Out Yes - 09:03 Taken: Start Time: 09:06 Pain Control: Lidocaine 4% Topical Solution Level: Skin/Subcutaneous Tissue Total Area Debrided (L x 0.2 (cm) x 0.2 (cm) = 0.04 (cm) W): Tissue and other Viable, Non-Viable, Other material debrided: Instrument: Forceps, Scissors Bleeding: Minimum Hemostasis Achieved: Pressure End Time: 09:07 Procedural Pain: 2 Post Procedural Pain: 2 Response to Treatment: Procedure was tolerated well Post Debridement Measurements of Total  Wound Character of Wound/Ulcer Post Requires Further Debridement Debridement: Post Procedure Diagnosis Same as Pre-procedure Notes subcutaneous debridement of mesh was done without any issues Electronic Signature(s) Signed: 12/22/2016 9:26:16 AM By: Christin Fudge MD, FACS Signed: 12/22/2016 9:47:23 AM By: Alric Quan Entered By: Christin Fudge on 12/22/2016 09:26:15 [name] ([id number]) -------------------------------------------------------------------------------- Debridement Details Patient Name: [name] Date of Service: 12/22/2016 9:45 AM Medical Record Number: [id number] Patient Account Number: [id number] Date of Birth/Sex: [birth year] (71 y.o. Male) Treating RN: Ahmed Prima Primary Care Provider: Thomes Lolling Other Clinician: Referring Provider: Thomes Lolling Treating Provider/Extender: Frann Rider in Treatment: 24 Debridement Performed for Wound #2 Left Abdomen - midline Assessment: Performed By: Physician Christin Fudge, MD Debridement: Debridement Pre-procedure Verification/Time Out Yes - 09:03 Taken: Start Time: 09:04 Pain Control: Lidocaine 4% Topical Solution Level: Skin/Subcutaneous Tissue Total Area Debrided (L x 0.4 (cm) x 0.3 (cm) = 0.12 (cm) W): Tissue and other Viable, Non-Viable, Other, Subcutaneous material debrided: Instrument: Forceps, Scissors Bleeding: Minimum Hemostasis Achieved: Pressure End Time: 09:05 Procedural Pain: 2 Post Procedural Pain: 0 Response to Treatment: Procedure was tolerated well Post Debridement Measurements of Total Wound Character of Wound/Ulcer Post Requires Further Debridement Debridement: Post Procedure Diagnosis Same as Pre-procedure Notes subcutaneous debridement of mesh was done without any issues Electronic Signature(s) Signed: 12/22/2016 9:27:32 AM By: Christin Fudge MD, FACS Signed: 12/22/2016 9:47:23 AM By: Alric Quan Entered By: Christin Fudge on 12/22/2016  09:27:32 [name] ([id number]) -------------------------------------------------------------------------------- HPI Details Patient Name: [name] Date of Service: 12/22/2016 9:45 AM Medical Record Number: [id number] Patient Account Number: [id number] Date of Birth/Sex: 05-Nov-1945 (71 y.o. Male) Treating RN: Ahmed Prima Primary Care Provider: Thomes Lolling Other Clinician: Referring Provider: Thomes Lolling Treating Provider/Extender: Frann Rider in Treatment: 24 History of Present Illness Location: abdomen wall wounds,-- right upper quadrant and epigastric region Quality: Patient reports experiencing a dull pain to affected area(s). Severity: Patient states wound are getting worse. Duration: Patient has had the wound for >5 year's prior to seeking treatment at  the wound center Timing: Pain in wound is Intermittent (comes and goes Context: The wound appeared gradually over time Modifying Factors: Consults to this date include:not seen a surgeon and only sees his PCP for his medical complaints Associated Signs and Symptoms: Patient reports having increase discharge. HPI Description: 71 year old gentleman has been seen recently by his PCP Dr. Thomes Lolling, who sees him with a history of diabetes mellitus, peripheral neuropathy, B12 deficiency, hypertension, osteoarthritis and the patient had recent blood sugar checked which was 163. Past medical history includes essential hypertension, incisional hernia, diabetes mellitus, colon polyps, COPD, tobacco abuse in the past, chewing tobacco, GERD. He has also been treated for chronic back pain with no sciatica and is on oxycodone. His last hemoglobin A1c was 6.9% His abdominal surgery was done at [facility] over 15 years ago and gradually there was sutures which protruded and then there was mesh which protruded. He washes this during his shower but does not put any dressing and the wound is covered with a lot of  debris. He has never had a surgical consultation for this. 07/10/2016 -- the patient has spoken to his PCP who said he was going to call me but I have not yet had a phone conversation with the physician. From what I understand the physician was reluctant to have a surgical opinion because of the patient's several comorbidities and he feels that the surgery may be detrimental to his overall health 07/24/2016 -- the patient's daughter is at the bedside and we've had a detailed discussion regarding his options for a surgical opinion and possible surgical intervention before this becomes a emergent situation. She will seek primary care input and take the father to [facility] for a surgical opinion. 07/31/2016 -- the patient's daughter has organized for a surgical opinion at [facility] on June 14 08/14/2016 -- it has been about 3 weeks since he has stopped chewing tobacco. 08/28/2016 -- he was seen at [facility] by Dr. Roby Lofts -- the assessment was based on physical examination and review of his previous history of surgical repair. the patient had undergone a ventral hernia repair in 1998 with Dr. Quay Burow at [facility] at a prior open cholecystectomy incision. he was evaluated by Dr. Denice Paradise in 2013 and at that time it was decided to watch and wait. Dr. Payton Doughty recommended a repeat CT scan and requested records from dura and regional operative records and would follow-up the patient, after Mr. Beyl PCP Dr. Gwenlyn Saran reviewed him for overall fitness for surgery. 09/04/16 patient's wound actually appear to be doing well on evaluation today. He is not really experience MARKOS, THEIL. ([id number]) any discomfort and the right abdominal wound is dry and almost appears to be healing up. He continues to have some drainage from the left but this seems to be more of a potential seroma which with milking is able to be cleared out. I discussed with patient that when he performs his dressing changes  at home it would be beneficial for him to do this as well. Otherwise there's no evidence of infection. He is still waiting on his CT scan and appointments to determine whether he is going to have another surgery. 09/11/2016 -- the patient has had a CT scan and is awaiting his surgical appointment prior to deciding on any surgical intervention. 09/25/16 patient presents today for fault evaluation concerning his ongoing abdominal surgical wounds. Unfortunately they do not appear to be improving significantly but rather fluctuate from a little  better to little worse week by week. Obviously this is somewhat frustrating for him. 10/02/2016 -- the patient has completed his CT scan, and has a review by the surgeons at the end of August. He has a new area which has opened up a little below his left abdominal wound. 10/16/16 on evaluation today patient's wounds on the abdominal region appears to be doing about the same. He actually has an appointment with his surgeon on the 21st for evaluation regarding the required products appointment. Obviously he is having a difficult time with the feeling of these wounds. Fortunately there does not appear to be any evidence of local infection such as significant amount of erythema or streaking. She also has an audio, vomiting, or diarrhea. He is not have any discomfort at this point. 10/30/2016 -- the surgical review is tomorrow and I am awaiting the input of the surgeon. 11/16/2016 -- I had a call from his surgeon Dr. Payton Doughty, on phone number [phone] who was kind enough to call me regarding his care. After a thorough review and having consulted the abdominal wall reconstruction team they have decided that surgery will be too extensive and rather moribund for this gentleman and have asked him to continue with local care at the wound center. She will send me an official note via Epic. Electronic Signature(s) Signed: 12/22/2016 9:29:12 AM By: Christin Fudge MD,  FACS Entered By: Christin Fudge on 12/22/2016 09:29:11 [name] ([id number]) -------------------------------------------------------------------------------- Physical Exam Details Patient Name: YOLTZIN, BARG Date of Service: 12/22/2016 9:45 AM Medical Record Number: [id number] Patient Account Number: [id number] Date of Birth/Sex: [birth year] (71 y.o. Male) Treating RN: Ahmed Prima Primary Care Provider: Thomes Lolling Other Clinician: Referring Provider: Thomes Lolling Treating Provider/Extender: Frann Rider in Treatment: 24 Constitutional . Pulse regular. Respirations normal and unlabored. Afebrile. . Eyes Nonicteric. Reactive to light. Ears, Nose, Mouth, and Throat Lips, teeth, and gums WNL.[name] Moist mucosa without lesions. Neck supple and nontender. No palpable supraclavicular or cervical adenopathy. Normal sized without goiter. Respiratory WNL. No retractions.. Cardiovascular Pedal Pulses WNL. No clubbing, cyanosis or edema. Lymphatic No adneopathy. No adenopathy. No adenopathy. Musculoskeletal Adexa without tenderness or enlargement.. Digits and nails w/o clubbing, cyanosis, infection, petechiae, ischemia, or inflammatory conditions.. Integumentary (Hair, Skin) No suspicious lesions. No crepitus or fluctuance. No peri-wound warmth or erythema. No masses.[name] Psychiatric Judgement and insight Intact.. No evidence of depression, anxiety, or agitation.. Notes 2 of the ulcerated areas had protruding mesh and after using appropriate technique forceps and scissors was used to snip the mesh and subcutaneous debris out and clean this area. Minimal bleeding controlled with pressure Electronic Signature(s) Signed: 12/22/2016 9:29:53 AM By: Christin Fudge MD, FACS Entered By: Christin Fudge on 12/22/2016 09:29:52 [name] ([id number]) -------------------------------------------------------------------------------- Physician Orders Details Patient Name: [name] Date of Service: 12/22/2016 9:45 AM Medical Record Number: [id number] Patient Account Number: [id number] Date of Birth/Sex: [birth year] (71 y.o. Male) Treating RN: Ahmed Prima Primary Care Provider: Thomes Lolling Other Clinician: Referring Provider: Thomes Lolling Treating Provider/Extender: Frann Rider in Treatment: 75 Verbal / Phone Orders: Yes Clinician: Carolyne Fiscal, Debi Read Back and Verified: Yes Diagnosis Coding Wound Cleansing Wound #1 Right Abdomen - midline o Clean wound with Normal Saline. o May Shower, gently pat wound dry prior to applying new dressing. Wound #2 Left Abdomen - midline o Clean wound with Normal Saline. o May Shower, gently pat wound dry prior to applying new dressing. Anesthetic Wound #1 Right Abdomen - midline o Topical Lidocaine 4%  cream applied to wound bed prior to debridement Wound #2 Left Abdomen - midline o Topical Lidocaine 4% cream applied to wound bed prior to debridement Skin Barriers/Peri-Wound Care Wound #1 Right Abdomen - midline o Skin Prep Wound #2 Left Abdomen - midline o Skin Prep Primary Wound Dressing Wound #1 Right Abdomen - midline o [place] Non-Adherent Wound #2 Left Abdomen - midline o [place] Non-Adherent Secondary Dressing Wound #1 Right Abdomen - midline o Other - [place] Wound #2 Left Abdomen - midline o Other - [place] ([id number]) Dressing Change Frequency Wound #1 Right Abdomen - midline o Change dressing every other day. Wound #2 Left Abdomen - midline o Change dressing every other day. Follow-up Appointments Wound #1 Right Abdomen - midline o Return Appointment in 2 weeks. Wound #2 Left Abdomen - midline o Return Appointment in 2 weeks. Additional Orders / Instructions Wound #1 Right Abdomen - midline o Other: - Go see a Engineer, drilling about your hernias and get a surgical opinion. Wound #2 Left Abdomen - midline o Other: -  Go see a Engineer, drilling about your hernias and get a surgical opinion. Electronic Signature(s) Signed: 12/22/2016 4:18:43 PM By: Christin Fudge MD, FACS Entered By: Christin Fudge on 12/22/2016 09:30:07 [name] ([id number]) -------------------------------------------------------------------------------- Problem List Details Patient Name: LAMARIO, MANI Date of Service: 12/22/2016 9:45 AM Medical Record Number: [id number] Patient Account Number: [id number] Date of Birth/Sex: [birth year] (71 y.o. Male) Treating RN: Ahmed Prima Primary Care Provider: Thomes Lolling Other Clinician: Referring Provider: Thomes Lolling Treating Provider/Extender: Frann Rider in Treatment: 24 Active Problems ICD-10 Encounter Code Description Active Date Diagnosis E11.622 Type 2 diabetes mellitus with other skin ulcer 07/03/2016 Yes S31.100A Unspecified open wound of abdominal wall, right upper 07/03/2016 Yes quadrant without penetration into peritoneal cavity, initial encounter S31.102A Unspecified open wound of abdominal wall, epigastric 07/03/2016 Yes region without penetration into peritoneal cavity, initial encounter F17.228 Nicotine dependence, chewing tobacco, with other 07/03/2016 Yes nicotine-induced disorders Inactive Problems Resolved Problems Electronic Signature(s) Signed: 12/22/2016 9:25:07 AM By: Christin Fudge MD, FACS Entered By: Christin Fudge on 12/22/2016 09:25:06 [name] ([id number]) -------------------------------------------------------------------------------- Progress Note Details Patient Name: [name] Date of Service: 12/22/2016 9:45 AM Medical Record Number: [id number] Patient Account Number: [id number] Date of Birth/Sex: 03-Mar-1946 (71 y.o. Male) Treating RN: Carolyne Fiscal, Debi Primary Care Provider: Thomes Lolling Other Clinician: Referring Provider: Thomes Lolling Treating Provider/Extender: Frann Rider in Treatment: 24 Subjective Chief  Complaint Information obtained from Patient Patients presents for treatment of an open diabetic ulcer to the anterior abdominal wall in the epigastric and right upper quadrant History of Present Illness (HPI) The following HPI elements were documented for the patient's wound: Location: abdomen wall wounds,-- right upper quadrant and epigastric region Quality: Patient reports experiencing a dull pain to affected area(s). Severity: Patient states wound are getting worse. Duration: Patient has had the wound for >5 year's prior to seeking treatment at the wound center Timing: Pain in wound is Intermittent (comes and goes Context: The wound appeared gradually over time Modifying Factors: Consults to this date include:not seen a surgeon and only sees his PCP for his medical complaints Associated Signs and Symptoms: Patient reports having increase discharge. 71 year old gentleman has been seen recently by his PCP Dr. Thomes Lolling, who sees him with a history of diabetes mellitus, peripheral neuropathy, B12 deficiency, hypertension, osteoarthritis and the patient had recent blood sugar checked which was 163. Past medical history includes essential hypertension, incisional hernia, diabetes mellitus,  colon polyps, COPD, tobacco abuse in the past, chewing tobacco, GERD. He has also been treated for chronic back pain with no sciatica and is on oxycodone. His last hemoglobin A1c was 6.9% His abdominal surgery was done at [facility] over 15 years ago and gradually there was sutures which protruded and then there was mesh which protruded. He washes this during his shower but does not put any dressing and the wound is covered with a lot of debris. He has never had a surgical consultation for this. 07/10/2016 -- the patient has spoken to his PCP who said he was going to call me but I have not yet had a phone conversation with the physician. From what I understand the physician was reluctant to have  a surgical opinion because of the patient's several comorbidities and he feels that the surgery may be detrimental to his overall health 07/24/2016 -- the patient's daughter is at the bedside and we've had a detailed discussion regarding his options for a surgical opinion and possible surgical intervention before this becomes a emergent situation. She will seek primary care input and take the father to [facility] for a surgical opinion. 07/31/2016 -- the patient's daughter has organized for a surgical opinion at [facility] on June 14 08/14/2016 -- it has been about 3 weeks since he has stopped chewing tobacco. TRYCE, SURRATT ([id number]) 08/28/2016 -- he was seen at [facility] by Dr. Roby Lofts -- the assessment was based on physical examination and review of his previous history of surgical repair. the patient had undergone a ventral hernia repair in 1998 with Dr. Quay Burow at [facility] at a prior open cholecystectomy incision. he was evaluated by Dr. Denice Paradise in 2013 and at that time it was decided to watch and wait. Dr. Payton Doughty recommended a repeat CT scan and requested records from dura and regional operative records and would follow-up the patient, after Mr. Liaw PCP Dr. Gwenlyn Saran reviewed him for overall fitness for surgery. 09/04/16 patient's wound actually appear to be doing well on evaluation today. He is not really experience any discomfort and the right abdominal wound is dry and almost appears to be healing up. He continues to have some drainage from the left but this seems to be more of a potential seroma which with milking is able to be cleared out. I discussed with patient that when he performs his dressing changes at home it would be beneficial for him to do this as well. Otherwise there's no evidence of infection. He is still waiting on his CT scan and appointments to determine whether he is going to have another surgery. 09/11/2016 -- the patient has had a CT scan and  is awaiting his surgical appointment prior to deciding on any surgical intervention. 09/25/16 patient presents today for fault evaluation concerning his ongoing abdominal surgical wounds. Unfortunately they do not appear to be improving significantly but rather fluctuate from a little better to little worse week by week. Obviously this is somewhat frustrating for him. 10/02/2016 -- the patient has completed his CT scan, and has a review by the surgeons at the end of August. He has a new area which has opened up a little below his left abdominal wound. 10/16/16 on evaluation today patient's wounds on the abdominal region appears to be doing about the same. He actually has an appointment with his surgeon on the 21st for evaluation regarding the required products appointment. Obviously he is having a difficult time with the feeling of  these wounds. Fortunately there does not appear to be any evidence of local infection such as significant amount of erythema or streaking. She also has an audio, vomiting, or diarrhea. He is not have any discomfort at this point. 10/30/2016 -- the surgical review is tomorrow and I am awaiting the input of the surgeon. 11/16/2016 -- I had a call from his surgeon Dr. Payton Doughty, on phone number [phone] who was kind enough to call me regarding his care. After a thorough review and having consulted the abdominal wall reconstruction team they have decided that surgery will be too extensive and rather moribund for this gentleman and have asked him to continue with local care at the wound center. She will send me an official note via Epic. Objective Constitutional CHUKWUKA, FESTA. ([id number]) Pulse regular. Respirations normal and unlabored. Afebrile. Vitals Time Taken: 8:50 AM, Height: 65 in, Weight: 161 lbs, BMI: 26.8, Temperature: 97.7 F, Pulse: 74 bpm, Respiratory Rate: 18 breaths/min, Blood Pressure: 160/82 mmHg. Eyes Nonicteric. Reactive to light. Ears, Nose,  Mouth, and Throat Lips, teeth, and gums WNL.[name] Moist mucosa without lesions. Neck supple and nontender. No palpable supraclavicular or cervical adenopathy. Normal sized without goiter. Respiratory WNL. No retractions.. Cardiovascular Pedal Pulses WNL. No clubbing, cyanosis or edema. Lymphatic No adneopathy. No adenopathy. No adenopathy. Musculoskeletal Adexa without tenderness or enlargement.. Digits and nails w/o clubbing, cyanosis, infection, petechiae, ischemia, or inflammatory conditions.[name] Psychiatric Judgement and insight Intact.. No evidence of depression, anxiety, or agitation.. General Notes: 2 of the ulcerated areas had protruding mesh and after using appropriate technique forceps and scissors was used to snip the mesh and subcutaneous debris out and clean this area. Minimal bleeding controlled with pressure Integumentary (Hair, Skin) No suspicious lesions. No crepitus or fluctuance. No peri-wound warmth or erythema. No masses.. Wound #1 status is Open. Original cause of wound was Gradually Appeared. The wound is located on the Right Abdomen - midline. The wound measures 0.1cm length x 0.1cm width x 0.1cm depth; 0.008cm^2 area and 0.001cm^3 volume. The wound is limited to skin breakdown. There is no tunneling or undermining noted. There is a medium amount of serosanguineous drainage noted. The wound margin is flat and intact. There is no granulation within the wound bed. There is a large (67-100%) amount of necrotic tissue within the wound bed including Eschar. The periwound skin appearance exhibited: Scarring. The periwound skin appearance did not exhibit: Callus, Crepitus, Excoriation, Induration, Rash, Dry/Scaly, Maceration, Atrophie Blanche, Cyanosis, Ecchymosis, Hemosiderin Staining, Mottled, Pallor, Rubor, Erythema. Periwound temperature was noted as No Abnormality. Wound #2 status is Open. Original cause of wound was Gradually Appeared. The wound is located on the Left  Abdomen - midline. The wound measures 5.3cm length x 0.5cm width x 0.1cm depth; 2.081cm^2 area Rossmann, Kimberly O. ([id number]) and 0.208cm^3 volume. The wound is limited to skin breakdown. There is no tunneling or undermining noted. There is a large amount of purulent drainage noted. The wound margin is flat and intact. There is medium (34-66%) red granulation within the wound bed. There is a medium (34-66%) amount of necrotic tissue within the wound bed including Eschar and Adherent Slough. The periwound skin appearance exhibited: Induration, Scarring, Ecchymosis. The periwound skin appearance did not exhibit: Callus, Crepitus, Excoriation, Rash, Dry/Scaly, Maceration, Atrophie Blanche, Cyanosis, Hemosiderin Staining, Mottled, Pallor, Rubor, Erythema. Periwound temperature was noted as No Abnormality. Assessment Active Problems ICD-10 E11.622 - Type 2 diabetes mellitus with other skin ulcer S31.100A - Unspecified open wound of abdominal wall, right upper quadrant without penetration  into peritoneal cavity, initial encounter S31.102A - Unspecified open wound of abdominal wall, epigastric region without penetration into peritoneal cavity, initial encounter F17.228 - Nicotine dependence, chewing tobacco, with other nicotine-induced disorders Procedures Wound #1 Pre-procedure diagnosis of Wound #1 is an Atypical located on the Right Abdomen - midline . There was a Skin/Subcutaneous Tissue Debridement (33295-18841) debridement with total area of 0.04 sq cm performed by Christin Fudge, MD. with the following instrument(s): Forceps and Scissors to remove Viable and Non-Viable tissue/material including Other after achieving pain control using Lidocaine 4% Topical Solution. A time out was conducted at 09:03, prior to the start of the procedure. A Minimum amount of bleeding was controlled with Pressure. The procedure was tolerated well with a pain level of 2 throughout and a pain level of 2 following the  procedure. Character of Wound/Ulcer Post Debridement requires further debridement. Post procedure Diagnosis Wound #1: Same as Pre-Procedure General Notes: subcutaneous debridement of mesh was done without any issues. Wound #2 Pre-procedure diagnosis of Wound #2 is an Atypical located on the Left Abdomen - midline . There was a Skin/Subcutaneous Tissue Debridement ([id number]) debridement with total area of 0.12 sq cm performed by Christin Fudge, MD. with the following instrument(s): Forceps and Scissors to remove Viable and Non-Viable tissue/material including Other and Subcutaneous after achieving pain control using Lidocaine 4% Topical Solution. A time out was conducted at 09:03, prior to the start of the procedure. A Minimum amount of bleeding was controlled with Pressure. The procedure was tolerated well with a pain level of 2 throughout and a pain level of 0 following the procedure. HEYWARD, DOUTHIT ([id number]) Character of Wound/Ulcer Post Debridement requires further debridement. Post procedure Diagnosis Wound #2: Same as Pre-Procedure General Notes: subcutaneous debridement of mesh was done without any issues. Plan Wound Cleansing: Wound #1 Right Abdomen - midline: Clean wound with Normal Saline. May Shower, gently pat wound dry prior to applying new dressing. Wound #2 Left Abdomen - midline: Clean wound with Normal Saline. May Shower, gently pat wound dry prior to applying new dressing. Anesthetic: Wound #1 Right Abdomen - midline: Topical Lidocaine 4% cream applied to wound bed prior to debridement Wound #2 Left Abdomen - midline: Topical Lidocaine 4% cream applied to wound bed prior to debridement Skin Barriers/Peri-Wound Care: Wound #1 Right Abdomen - midline: Skin Prep Wound #2 Left Abdomen - midline: Skin Prep Primary Wound Dressing: Wound #1 Right Abdomen - midline: [place] Non-Adherent Wound #2 Left Abdomen - midline: [place] Non-Adherent Secondary  Dressing: Wound #1 Right Abdomen - midline: Other - [place] Wound #2 Left Abdomen - midline: Other - [place] Dressing Change Frequency: Wound #1 Right Abdomen - midline: Change dressing every other day. Wound #2 Left Abdomen - midline: Change dressing every other day. Follow-up Appointments: Wound #1 Right Abdomen - midline: Return Appointment in 2 weeks. Wound #2 Left Abdomen - midline: Return Appointment in 2 weeks. Additional Orders / Instructions: Wound #1 Right Abdomen - midline: Other: - Go see a Engineer, drilling about your hernias and get a surgical opinion. MAYSIN, CARSTENS ([id number]) Wound #2 Left Abdomen - midline: Other: - Go see a Engineer, drilling about your hernias and get a surgical opinion. today he had protruding mesh in 2 different areas and I sharply remove this to remove the foreign body from the subcutaneous area. He will see me every 2-3 weeks, and I have recommended we continue to use silver alginate locally and have discussed wound care in detail and we will see  him back in 2 weeks' time. Electronic Signature(s) Signed: 12/22/2016 9:30:56 AM By: Christin Fudge MD, FACS Entered By: Christin Fudge on 12/22/2016 09:30:56 [name] ([id number]) -------------------------------------------------------------------------------- SuperBill Details Patient Name: [name] Date of Service: 12/22/2016 Medical Record Number: [id number] Patient Account Number: [id number] Date of Birth/Sex: 1945/04/11 (71 y.o. Male) Treating RN: Carolyne Fiscal, Debi Primary Care Provider: Thomes Lolling Other Clinician: Referring Provider: Thomes Lolling Treating Provider/Extender: Frann Rider in Treatment: 24 Diagnosis Coding ICD-10 Codes Code Description E11.622 Type 2 diabetes mellitus with other skin ulcer Unspecified open wound of abdominal wall, right upper quadrant without penetration into S31.100A peritoneal cavity, initial encounter Unspecified open wound of  abdominal wall, epigastric region without penetration into S31.102A peritoneal cavity, initial encounter F17.228 Nicotine dependence, chewing tobacco, with other nicotine-induced disorders Facility Procedures CPT4: Description Modifier Quantity Code [id number] 11042 - DEB SUBQ TISSUE 20 SQ CM/< 1 ICD-10 Description Diagnosis E11.622 Type 2 diabetes mellitus with other skin ulcer S31.100A Unspecified open wound of abdominal wall, right upper quadrant without  penetration into peritoneal cavity, initial encounter S31.102A Unspecified open wound of abdominal wall, epigastric region without penetration into peritoneal cavity, initial encounter F17.228 Nicotine dependence, chewing tobacco, with other  nicotine-induced disorders Physician Procedures CPT4: Description Modifier Quantity Code [id number] 11042 - WC PHYS SUBQ TISS 20 SQ CM 1 ICD-10 Description Diagnosis E11.622 Type 2 diabetes mellitus with other skin ulcer S31.100A Unspecified open wound of abdominal wall, right upper quadrant without  penetration into peritoneal cavity, initial encounter S31.102A Unspecified open wound of abdominal wall, epigastric region without penetration into peritoneal cavity, initial encounter F17.228 Nicotine dependence, chewing tobacco, with other  nicotine-induced disorders DAANISH, COPES ([id number]) Electronic Signature(s) Signed: 12/22/2016 9:31:26 AM By: Christin Fudge MD, FACS Previous Signature: 12/22/2016 9:31:15 AM Version By: Christin Fudge MD, FACS Entered By: Christin Fudge on 12/22/2016 09:31:25

## 2016-12-26 ENCOUNTER — Ambulatory Visit
Admission: RE | Admit: 2016-12-26 | Discharge: 2016-12-26 | Disposition: A | Discharge status: Home / Self Care | Payer: MEDICARE

## 2016-12-26 ENCOUNTER — Ambulatory Visit
Admission: RE | Admit: 2016-12-26 | Discharge: 2016-12-26 | Disposition: A | Discharge status: Home / Self Care | Payer: MEDICARE | Attending: Internal Medicine

## 2016-12-26 DIAGNOSIS — G8929 Other chronic pain: Secondary | ICD-10-CM

## 2016-12-26 DIAGNOSIS — E1142 Type 2 diabetes mellitus with diabetic polyneuropathy: Principal | ICD-10-CM

## 2016-12-26 DIAGNOSIS — I10 Essential (primary) hypertension: Secondary | ICD-10-CM

## 2016-12-26 DIAGNOSIS — M5090 Cervical disc disorder, unspecified, unspecified cervical region: Secondary | ICD-10-CM

## 2016-12-26 DIAGNOSIS — M545 Low back pain: Secondary | ICD-10-CM

## 2016-12-26 MED ORDER — OXYCODONE-ACETAMINOPHEN 5 MG-325 MG TABLET: 1 | tablet | 0 refills | 0 days | Status: AC

## 2016-12-26 MED ORDER — OXYCODONE-ACETAMINOPHEN 5 MG-325 MG TABLET
ORAL_TABLET | ORAL | 0 refills | 0.00000 days | Status: CP | PRN
Start: 2016-12-26 — End: 2016-12-26

## 2016-12-29 ENCOUNTER — Ambulatory Visit
Admission: RE | Admit: 2016-12-29 | Discharge: 2016-12-29 | Payer: MEDICARE | Attending: Audiologist | Admitting: Audiologist

## 2016-12-29 DIAGNOSIS — H903 Sensorineural hearing loss, bilateral: Principal | ICD-10-CM

## 2017-01-05 ENCOUNTER — Encounter: Payer: Medicare Other | Admitting: Physician Assistant

## 2017-01-05 DIAGNOSIS — E11622 Type 2 diabetes mellitus with other skin ulcer: Secondary | ICD-10-CM | POA: Diagnosis not present

## 2017-01-08 NOTE — Progress Notes (Addendum)
[name], [name] ([id number]) Visit Report for 01/05/2017 Arrival Information Details Patient Name: [name], [name]. Date of Service: 01/05/2017 10:15 AM Medical Record Number: [id number] Patient Account Number: [id number] Date of Birth/Sex: 05-22-1945 (71 y.o. Male) Treating RN: Montey Hora Primary Care Ademide Schaberg: Thomes Lolling Other Clinician: Referring Kymberly Blomberg: Thomes Lolling Treating Mahsa Hanser/Extender: Sharalyn Ink in Treatment: 26 Visit Information History Since Last Visit Added or deleted any medications: No Patient Arrived: Ambulatory Any new allergies or adverse reactions: No Arrival Time: 10:21 Had a fall or experienced change in No Accompanied By: self activities of daily living that may affect Transfer Assistance: None risk of falls: Patient Identification Verified: Yes Signs or symptoms of abuse/neglect since last visito No Secondary Verification Process Completed: Yes Hospitalized since last visit: No Patient Requires Transmission-Based No Has Dressing in Place as Prescribed: Yes Precautions: Pain Present Now: No Patient Has Alerts: Yes Patient Alerts: DMII Electronic Signature(s) Signed: 01/05/2017 4:32:17 PM By: Montey Hora Entered By: Montey Hora on 01/05/2017 10:22:07 [name] ([id number]) -------------------------------------------------------------------------------- Clinic Level of Care Assessment Details Patient Name: [name] Date of Service: 01/05/2017 10:15 AM Medical Record Number: [id number] Patient Account Number: [id number] Date of Birth/Sex: 09-25-1945 (71 y.o. Male) Treating RN: Montey Hora Primary Care Evyn Putzier: Thomes Lolling Other Clinician: Referring [name] Zogg: Thomes Lolling Treating Murriel Holwerda/Extender: Melburn Hake, HOYT Weeks in Treatment: 26 Clinic Level of Care Assessment Items TOOL 4 Quantity Score []  - Use when only an EandM is performed on FOLLOW-UP visit 0 ASSESSMENTS - Nursing Assessment /  Reassessment X - Reassessment of Co-morbidities (includes updates in patient status) 1 10 X- 1 5 Reassessment of Adherence to Treatment Plan ASSESSMENTS - Wound and Skin Assessment / Reassessment []  - Simple Wound Assessment / Reassessment - one wound 0 X- 2 5 Complex Wound Assessment / Reassessment - multiple wounds []  - 0 Dermatologic / Skin Assessment (not related to wound area) ASSESSMENTS - Focused Assessment []  - Circumferential Edema Measurements - multi extremities 0 []  - 0 Nutritional Assessment / Counseling / Intervention []  - 0 Lower Extremity Assessment (monofilament, tuning fork, pulses) []  - 0 Peripheral Arterial Disease Assessment (using hand held doppler) ASSESSMENTS - Ostomy and/or Continence Assessment and Care []  - Incontinence Assessment and Management 0 []  - 0 Ostomy Care Assessment and Management (repouching, etc.) PROCESS - Coordination of Care X - Simple Patient / Family Education for ongoing care 1 15 []  - 0 Complex (extensive) Patient / Family Education for ongoing care []  - 0 Staff obtains Programmer, systems, Records, Test Results / Process Orders []  - 0 Staff telephones HHA, Nursing Homes / Clarify orders / etc []  - 0 Routine Transfer to another Facility (non-emergent condition) []  - 0 Routine Hospital Admission (non-emergent condition) []  - 0 New Admissions / Biomedical engineer / Ordering NPWT, Apligraf, etc. []  - 0 Emergency Hospital Admission (emergent condition) X- 1 10 Simple Discharge Coordination [name] ([id number]) []  - 0 Complex (extensive) Discharge Coordination PROCESS - Special Needs []  - Pediatric / Minor Patient Management 0 []  - 0 Isolation Patient Management []  - 0 Hearing / Language / Visual special needs []  - 0 Assessment of Community assistance (transportation, D/C planning, etc.) []  - 0 Additional assistance / Altered mentation []  - 0 Support Surface(s) Assessment (bed, cushion, seat, etc.) INTERVENTIONS -  Wound Cleansing / Measurement []  - Simple Wound Cleansing - one wound 0 X- 2 5 Complex Wound Cleansing - multiple wounds X- 1 5 Wound Imaging (photographs - any number of wounds) []  - 0  Wound Tracing (instead of photographs) []  - 0 Simple Wound Measurement - one wound X- 2 5 Complex Wound Measurement - multiple wounds INTERVENTIONS - Wound Dressings X - Small Wound Dressing one or multiple wounds 2 10 []  - 0 Medium Wound Dressing one or multiple wounds []  - 0 Large Wound Dressing one or multiple wounds []  - 0 Application of Medications - topical []  - 0 Application of Medications - injection INTERVENTIONS - Miscellaneous []  - External ear exam 0 []  - 0 Specimen Collection (cultures, biopsies, blood, body fluids, etc.) []  - 0 Specimen(s) / Culture(s) sent or taken to Lab for analysis []  - 0 Patient Transfer (multiple staff / Civil Service fast streamer / Similar devices) []  - 0 Simple Staple / Suture removal (25 or less) []  - 0 Complex Staple / Suture removal (26 or more) []  - 0 Hypo / Hyperglycemic Management (close monitor of Blood Glucose) []  - 0 Ankle / Brachial Index (ABI) - do not check if billed separately X- 1 5 Vital Signs [name], TELFAIR. ([id number]) Has the patient been seen at the hospital within the last three years: Yes Total Score: 100 Level Of Care: New/Established - Level 3 Electronic Signature(s) Signed: 01/05/2017 4:32:17 PM By: Montey Hora Entered By: Montey Hora on 01/05/2017 10:51:40 [name] ([id number]) -------------------------------------------------------------------------------- Encounter Discharge Information Details Patient Name: [name] Date of Service: 01/05/2017 10:15 AM Medical Record Number: [id number] Patient Account Number: [id number] Date of Birth/Sex: 06-12-1945 (71 y.o. Male) Treating RN: Montey Hora Primary Care Artha Stavros: Thomes Lolling Other Clinician: Referring Madox Corkins: Thomes Lolling Treating Beyonca Wisz/Extender:  Melburn Hake, HOYT Weeks in Treatment: 74 Encounter Discharge Information Items Discharge Pain Level: 0 Discharge Condition: Stable Ambulatory Status: Ambulatory Discharge Destination: Home Transportation: Private Auto Accompanied By: self Schedule Follow-up Appointment: Yes Medication Reconciliation completed and No provided to Patient/Care Evella Kasal: Patient Clinical Summary of Care: Declined Electronic Signature(s) Signed: 01/05/2017 10:54:31 AM By: Montey Hora Entered By: Montey Hora on 01/05/2017 10:54:31 [name] ([id number]) -------------------------------------------------------------------------------- Multi Wound Chart Details Patient Name: [name] Date of Service: 01/05/2017 10:15 AM Medical Record Number: [id number] Patient Account Number: [id number] Date of Birth/Sex: [birth year] (71 y.o. Male) Treating RN: Montey Hora Primary Care Averey Trompeter: Thomes Lolling Other Clinician: Referring Yaniyah Koors: Thomes Lolling Treating Even Budlong/Extender: Melburn Hake, HOYT Weeks in Treatment: 26 Vital Signs Height(in): 65 Pulse(bpm): 3 Weight(lbs): 161 Blood Pressure(mmHg): 134/58 Body Mass Index(BMI): 27 Temperature(F): 98.3 Respiratory Rate 18 (breaths/min): Photos: [1:No Photos] [2:No Photos] [N/A:N/A] Wound Location: [1:Right Abdomen - midline] [2:Left Abdomen - midline] [N/A:N/A] Wounding Event: [1:Gradually Appeared] [2:Gradually Appeared] [N/A:N/A] Primary Etiology: [1:Atypical] [2:Atypical] [N/A:N/A] Comorbid History: [1:Cataracts, Chronic Obstructive Cataracts, Chronic Obstructive N/A Pulmonary Disease (COPD), Pulmonary Disease (COPD), Hypertension, Type II Diabetes, Osteoarthritis, Neuropathy] [2:Hypertension, Type II Diabetes, Osteoarthritis,  Neuropathy] Date Acquired: [1:05/17/2015] [2:05/17/2015] [N/A:N/A] Weeks of Treatment: [1:26] [2:26] [N/A:N/A] Wound Status: [1:Open] [2:Open] [N/A:N/A] Clustered Wound: [1:No] [2:Yes] [N/A:N/A] Clustered  Quantity: [1:N/A] [2:3] [N/A:N/A] Measurements L x W x D [1:0.1x0.1x0.1] [2:5.1x0.7x0.1] [N/A:N/A] (cm) Area (cm) : [1:0.008] [2:2.804] [N/A:N/A] Volume (cm) : [1:0.001] [2:0.28] [N/A:N/A] % Reduction in Area: [1:99.80%] [2:46.90%] [N/A:N/A] % Reduction in Volume: [1:99.70%] [2:47.00%] [N/A:N/A] Classification: [1:Full Thickness Without Exposed Support Structures] [2:Full Thickness Without Exposed Support Structures] [N/A:N/A] Exudate Amount: [1:Medium] [2:Large] [N/A:N/A] Exudate Type: [1:Serosanguineous] [2:Purulent] [N/A:N/A] Exudate Color: [1:red, [place]y] [2:yellow, [place], green] [N/A:N/A] Wound Margin: [1:Flat and Intact] [2:Flat and Intact] [N/A:N/A] Granulation Amount: [1:Large (67-100%)] [2:Medium (34-66%)] [N/A:N/A] Granulation Quality: [1:Pink] [2:Red] [N/A:N/A] Necrotic Amount: [1:None Present (0%)] [2:Medium (34-66%)] [N/A:N/A] Necrotic Tissue: [1:N/A] [  2:Eschar, Adherent Slough] [N/A:N/A] Exposed Structures: [1:Fascia: No Fat Layer (Subcutaneous Tissue) Exposed: No Tendon: No Muscle: No Joint: No] [2:Fascia: No Fat Layer (Subcutaneous Tissue) Exposed: No Tendon: No Muscle: No Joint: No] [N/A:N/A] Bone: No Bone: No Limited to Skin Breakdown Limited to Skin Breakdown Epithelialization: Medium (34-66%) Small (1-33%) N/A Periwound Skin Texture: Scarring: Yes Induration: Yes N/A Excoriation: No Scarring: Yes Induration: No Excoriation: No Callus: No Callus: No Crepitus: No Crepitus: No Rash: No Rash: No Periwound Skin Moisture: Maceration: No Maceration: No N/A Dry/Scaly: No Dry/Scaly: No Periwound Skin Color: Atrophie Blanche: No Ecchymosis: Yes N/A Cyanosis: No Atrophie Blanche: No Ecchymosis: No Cyanosis: No Erythema: No Erythema: No Hemosiderin Staining: No Hemosiderin Staining: No Mottled: No Mottled: No Pallor: No Pallor: No Rubor: No Rubor: No Temperature: No Abnormality No Abnormality N/A Tenderness on Palpation: No No N/A Wound  Preparation: Ulcer Cleansing: Ulcer Cleansing: N/A Rinsed/Irrigated with Saline Rinsed/Irrigated with Saline Topical Anesthetic Applied: Topical Anesthetic Applied: Other: lidocaine 4% Other: lidocaine 4% Treatment Notes Electronic Signature(s) Signed: 01/05/2017 4:32:17 PM By: Montey Hora Entered By: Montey Hora on 01/05/2017 10:40:32 [name] ([id number]) -------------------------------------------------------------------------------- [place] Details Patient Name: [name], [name] Date of Service: 01/05/2017 10:15 AM Medical Record Number: [id number] Patient Account Number: [id number] Date of Birth/Sex: September 19, 1945 (71 y.o. Male) Treating RN: Montey Hora Primary Care Janecia Palau: Thomes Lolling Other Clinician: Referring Steffani Dionisio: Thomes Lolling Treating Faryn Sieg/Extender: Melburn Hake, HOYT Weeks in Treatment: 26 Active Inactive ` Abuse / Safety / Falls / Self Care Management Nursing Diagnoses: Impaired physical mobility Goals: Patient will remain injury free Date Initiated: 07/03/2016 Target Resolution Date: 09/14/2016 Goal Status: Active Interventions: Assess fall risk on admission and as needed Notes: ` Orientation to the Wound Care Program Nursing Diagnoses: Knowledge deficit related to the wound healing center program Goals: Patient/caregiver will verbalize understanding of the [place] Date Initiated: 07/03/2016 Target Resolution Date: 09/15/2016 Goal Status: Active Interventions: Provide education on orientation to the wound center Notes: ` Wound/Skin Impairment Nursing Diagnoses: Impaired tissue integrity Goals: Patient/caregiver will verbalize understanding of skin care regimen Date Initiated: 07/03/2016 Target Resolution Date: 09/15/2016 Goal Status: Active Ulcer/skin breakdown will have a volume reduction of 30% by week 4 Date Initiated: 07/03/2016 Target Resolution Date: 09/15/2016 [name], [name]  ([id number]) Goal Status: Active Ulcer/skin breakdown will have a volume reduction of 50% by week 8 Date Initiated: 07/03/2016 Target Resolution Date: 09/15/2016 Goal Status: Active Ulcer/skin breakdown will have a volume reduction of 80% by week 12 Date Initiated: 07/03/2016 Target Resolution Date: 09/15/2016 Goal Status: Active Ulcer/skin breakdown will heal within 14 weeks Date Initiated: 07/03/2016 Target Resolution Date: 09/15/2016 Goal Status: Active Interventions: Assess patient/caregiver ability to obtain necessary supplies Assess patient/caregiver ability to perform ulcer/skin care regimen upon admission and as needed Assess ulceration(s) every visit Notes: Electronic Signature(s) Signed: 01/05/2017 4:32:17 PM By: Montey Hora Entered By: Montey Hora on 01/05/2017 10:40:16 [name] ([id number]) -------------------------------------------------------------------------------- Pain Assessment Details Patient Name: [name] Date of Service: 01/05/2017 10:15 AM Medical Record Number: [id number] Patient Account Number: [id number] Date of Birth/Sex: 1946/03/13 (71 y.o. Male) Treating RN: Montey Hora Primary Care Sunnie Odden: Thomes Lolling Other Clinician: Referring Zameria Vogl: Thomes Lolling Treating Dhanvin Szeto/Extender: Melburn Hake, HOYT Weeks in Treatment: 26 Active Problems Location of Pain Severity and Description of Pain Patient Has Paino No Site Locations Pain Management and Medication Current Pain Management: Notes Topical or injectable lidocaine is offered to patient for acute pain when surgical debridement is performed. If needed, Patient is instructed  to use over the counter pain medication for the following 24-48 hours after debridement. Wound care MDs do not prescribed pain medications. Patient has chronic pain or uncontrolled pain. Patient has been instructed to make an appointment with their Primary Care Physician for pain management. Electronic  Signature(s) Signed: 01/05/2017 4:32:17 PM By: Montey Hora Entered By: Montey Hora on 01/05/2017 10:22:18 [name] ([id number]) -------------------------------------------------------------------------------- Patient/Caregiver Education Details Patient Name: [name], [name] Date of Service: 01/05/2017 10:15 AM Medical Record Number: [id number] Patient Account Number: [id number] Date of Birth/Gender: 1945/08/22 (71 y.o. Male) Treating RN: Montey Hora Primary Care Physician: Thomes Lolling Other Clinician: Referring Physician: Thomes Lolling Treating Physician/Extender: Sharalyn Ink in Treatment: 25 Education Assessment Education Provided To: Patient Education Topics Provided Wound/Skin Impairment: Handouts: Other: wound care as ordered Methods: Demonstration, Explain/Verbal Responses: State content correctly Electronic Signature(s) Signed: 01/05/2017 4:32:17 PM By: Montey Hora Entered By: Montey Hora on 01/05/2017 10:54:48 [name] ([id number]) -------------------------------------------------------------------------------- Wound Assessment Details Patient Name: [name] Date of Service: 01/05/2017 10:15 AM Medical Record Number: [id number] Patient Account Number: [id number] Date of Birth/Sex: 10-19-1945 (71 y.o. Male) Treating RN: Montey Hora Primary Care Thena Devora: Thomes Lolling Other Clinician: Referring Daiton Cowles: Thomes Lolling Treating Teran Knittle/Extender: Melburn Hake, HOYT Weeks in Treatment: 26 Wound Status Wound Number: 1 Primary Atypical Etiology: Wound Location: Right Abdomen - midline Wound Open Wounding Event: Gradually Appeared Status: Date Acquired: 05/17/2015 Comorbid Cataracts, Chronic Obstructive Pulmonary Weeks Of Treatment: 26 History: Disease (COPD), Hypertension, Type II Clustered Wound: No Diabetes, Osteoarthritis, Neuropathy Photos Photo Uploaded By: Montey Hora on 01/05/2017 13:36:22 Wound  Measurements Length: (cm) 0.1 Width: (cm) 0.1 Depth: (cm) 0.1 Area: (cm) 0.008 Volume: (cm) 0.001 % Reduction in Area: 99.8% % Reduction in Volume: 99.7% Epithelialization: Medium (34-66%) Tunneling: No Undermining: No Wound Description Full Thickness Without Exposed Support Foul O Classification: Structures Slough Wound Margin: Flat and Intact Exudate Medium Amount: Exudate Type: Serosanguineous Exudate Color: red, [place] dor After Cleansing: No /Fibrino No Wound Bed Granulation Amount: Large (67-100%) Exposed Structure Granulation Quality: Pink Fascia Exposed: No Necrotic Amount: None Present (0%) Fat Layer (Subcutaneous Tissue) Exposed: No Tendon Exposed: No Muscle Exposed: No Joint Exposed: No Bone Exposed: No [name], CUFF. ([id number]) Limited to Skin Breakdown Periwound Skin Texture Texture Color No Abnormalities Noted: No No Abnormalities Noted: No Callus: No Atrophie Blanche: No Crepitus: No Cyanosis: No Excoriation: No Ecchymosis: No Induration: No Erythema: No Rash: No Hemosiderin Staining: No Scarring: Yes Mottled: No Pallor: No Moisture Rubor: No No Abnormalities Noted: No Dry / Scaly: No Temperature / Pain Maceration: No Temperature: No Abnormality Wound Preparation Ulcer Cleansing: Rinsed/Irrigated with Saline Topical Anesthetic Applied: Other: lidocaine 4%, Treatment Notes Wound #1 (Right Abdomen - midline) 1. Cleansed with: Clean wound with Normal Saline 2. Anesthetic Topical Lidocaine 4% cream to wound bed prior to debridement 4. Dressing Applied: Other dressing (specify in notes) 5. Secondary [place] Notes silvercel Electronic Signature(s) Signed: 01/05/2017 4:32:17 PM By: Montey Hora Entered By: Montey Hora on 01/05/2017 10:39:37 [name] ([id number]) -------------------------------------------------------------------------------- Wound Assessment Details Patient Name: [name] Date of Service: 01/05/2017 10:15 AM Medical Record Number: [id number] Patient Account Number: [id number] Date of Birth/Sex: 1945/04/30 (71 y.o. Male) Treating RN: Montey Hora Primary Care Khan Chura: Thomes Lolling Other Clinician: Referring Kellis Mcadam: Thomes Lolling Treating Liliauna Santoni/Extender: Melburn Hake, HOYT Weeks in Treatment: 26 Wound Status Wound Number: 2 Primary Atypical Etiology: Wound Location: Left Abdomen - midline Wound Open Wounding Event: Gradually Appeared Status: Date Acquired:  05/17/2015 Comorbid Cataracts, Chronic Obstructive Pulmonary Weeks Of Treatment: 26 History: Disease (COPD), Hypertension, Type II Clustered Wound: Yes Diabetes, Osteoarthritis, Neuropathy Photos Photo Uploaded By: Montey Hora on 01/05/2017 13:36:23 Wound Measurements Length: (cm) 5.1 Width: (cm) 0.7 Depth: (cm) 0.1 Clustered Quantity: 3 Area: (cm) 2.804 Volume: (cm) 0.28 % Reduction in Area: 46.9% % Reduction in Volume: 47% Epithelialization: Small (1-33%) Tunneling: No Undermining: No Wound Description Full Thickness Without Exposed Support Foul O Classification: Structures Slough Wound Margin: Flat and Intact Exudate Large Amount: Exudate Type: Purulent Exudate Color: yellow, Mcanelly, green dor After Cleansing: No /Fibrino No Wound Bed Granulation Amount: Medium (34-66%) Exposed Structure Granulation Quality: Red Fascia Exposed: No Necrotic Amount: Medium (34-66%) Fat Layer (Subcutaneous Tissue) Exposed: No Necrotic Quality: Eschar, Adherent Slough Tendon Exposed: No Muscle Exposed: No Joint Exposed: No [name], [name] ([id number]) Bone Exposed: No Limited to Skin Breakdown Periwound Skin Texture Texture Color No Abnormalities Noted: No No Abnormalities Noted: No Callus: No Atrophie Blanche: No Crepitus: No Cyanosis: No Excoriation: No Ecchymosis: Yes Induration: Yes Erythema: No Rash: No Hemosiderin Staining: No Scarring: Yes Mottled:  No Pallor: No Moisture Rubor: No No Abnormalities Noted: No Dry / Scaly: No Temperature / Pain Maceration: No Temperature: No Abnormality Wound Preparation Ulcer Cleansing: Rinsed/Irrigated with Saline Topical Anesthetic Applied: Other: lidocaine 4%, Treatment Notes Wound #2 (Left Abdomen - midline) 1. Cleansed with: Clean wound with Normal Saline 2. Anesthetic Topical Lidocaine 4% cream to wound bed prior to debridement 4. Dressing Applied: Other dressing (specify in notes) 5. Secondary [place] Notes silvercel Electronic Signature(s) Signed: 01/05/2017 4:32:17 PM By: Montey Hora Entered By: Montey Hora on 01/05/2017 10:40:00 [name] ([id number]) -------------------------------------------------------------------------------- [place] Details Patient Name: [name] Date of Service: 01/05/2017 10:15 AM Medical Record Number: [id number] Patient Account Number: [id number] Date of Birth/Sex: June 21, 1945 (71 y.o. Male) Treating RN: Montey Hora Primary Care Kama Cammarano: Thomes Lolling Other Clinician: Referring Lavan Imes: Thomes Lolling Treating Mackie Goon/Extender: Melburn Hake, HOYT Weeks in Treatment: 26 Vital Signs Time Taken: 10:22 Temperature (F): 98.3 Height (in): 65 Pulse (bpm): 79 Weight (lbs): 161 Respiratory Rate (breaths/min): 18 Body Mass Index (BMI): 26.8 Blood Pressure (mmHg): 134/58 Reference Range: 80 - 120 mg / dl Electronic Signature(s) Signed: 01/05/2017 4:32:17 PM By: Montey Hora Entered By: Montey Hora on 01/05/2017 10:23:10

## 2017-01-08 NOTE — Progress Notes (Signed)
[name] ([id number]) Visit Report for 01/05/2017 Chief Complaint Document Details Patient Name: [name], [name]. Date of Service: 01/05/2017 10:15 AM Medical Record Number: [id number] Patient Account Number: [id number] Date of Birth/Sex: [birth year] (71 y.o. Male) Treating RN: Montey Hora Primary Care Provider: Thomes Lolling Other Clinician: Referring Provider: Thomes Lolling Treating Provider/Extender: Sharalyn Ink in Treatment: 26 Information Obtained from: Patient Chief Complaint Patients presents for treatment of an open diabetic ulcer to the anterior abdominal wall in the epigastric and right upper quadrant Electronic Signature(s) Signed: 01/05/2017 5:05:22 PM By: Worthy Keeler PA-C Entered By: Worthy Keeler on 01/05/2017 10:35:18 [name] ([id number]) -------------------------------------------------------------------------------- HPI Details Patient Name: [name] Date of Service: 01/05/2017 10:15 AM Medical Record Number: [id number] Patient Account Number: [id number] Date of Birth/Sex: 03/09/1946 (71 y.o. Male) Treating RN: Montey Hora Primary Care Provider: Thomes Lolling Other Clinician: Referring Provider: Thomes Lolling Treating Provider/Extender: Melburn Hake, Dnya Hickle Weeks in Treatment: 26 History of Present Illness Location: abdomen wall wounds,-- right upper quadrant and epigastric region Quality: Patient reports experiencing a dull pain to affected area(s). Severity: Patient states wound are getting worse. Duration: Patient has had the wound for >5 year's prior to seeking treatment at the wound center Timing: Pain in wound is Intermittent (comes and goes Context: The wound appeared gradually over time Modifying Factors: Consults to this date include:not seen a surgeon and only sees his PCP for his medical complaints Associated Signs and Symptoms: Patient reports having increase discharge. HPI Description: 71 year old gentleman has  been seen recently by his PCP Dr. Thomes Lolling, who sees him with a history of diabetes mellitus, peripheral neuropathy, B12 deficiency, hypertension, osteoarthritis and the patient had recent blood sugar checked which was 163. Past medical history includes essential hypertension, incisional hernia, diabetes mellitus, colon polyps, COPD, tobacco abuse in the past, chewing tobacco, GERD. He has also been treated for chronic back pain with no sciatica and is on oxycodone. His last hemoglobin A1c was 6.9% His abdominal surgery was done at [facility] over 15 years ago and gradually there was sutures which protruded and then there was mesh which protruded. He washes this during his shower but does not put any dressing and the wound is covered with a lot of debris. He has never had a surgical consultation for this. 07/10/2016 -- the patient has spoken to his PCP who said he was going to call me but I have not yet had a phone conversation with the physician. From what I understand the physician was reluctant to have a surgical opinion because of the patient's several comorbidities and he feels that the surgery may be detrimental to his overall health 07/24/2016 -- the patient's daughter is at the bedside and we've had a detailed discussion regarding his options for a surgical opinion and possible surgical intervention before this becomes a emergent situation. She will seek primary care input and take the father to [facility] for a surgical opinion. 07/31/2016 -- the patient's daughter has organized for a surgical opinion at [facility] on June 14 08/14/2016 -- it has been about 3 weeks since he has stopped chewing tobacco. 08/28/2016 -- he was seen at [facility] by Dr. Roby Lofts -- the assessment was based on physical examination and review of his previous history of surgical repair. the patient had undergone a ventral hernia repair in 1998 with Dr. Quay Burow at [facility] at a prior open  cholecystectomy incision. he was evaluated by Dr. Denice Paradise in 2013  and at that time it was decided to watch and wait. Dr. Payton Doughty recommended a repeat CT scan and requested records from dura and regional operative records and would follow-up the patient, after Mr. Dorko PCP Dr. Gwenlyn Saran reviewed him for overall fitness for surgery. 09/04/16 patient's wound actually appear to be doing well on evaluation today. He is not really experience any discomfort and the right abdominal wound is dry and almost appears to be healing up. He continues to have some drainage from the left but this seems to be more of a potential seroma which with milking is able to be cleared out. I discussed with patient that when he performs his dressing changes at home it would be beneficial for him to do this as well. Otherwise there's no evidence of infection. He is still waiting on his CT scan and appointments to determine whether he is going to have another surgery. 09/11/2016 -- the patient has had a CT scan and is awaiting his surgical appointment prior to deciding on any surgical intervention. 09/25/16 patient presents today for fault evaluation concerning his ongoing abdominal surgical wounds. Unfortunately they do not appear to be improving significantly but rather fluctuate from a little better to little worse week by week. Obviously this is somewhat frustrating for him. [name], [name] ([id number]) 10/02/2016 -- the patient has completed his CT scan, and has a review by the surgeons at the end of August. He has a new area which has opened up a little below his left abdominal wound. 10/16/16 on evaluation today patient's wounds on the abdominal region appears to be doing about the same. He actually has an appointment with his surgeon on the 21st for evaluation regarding the required products appointment. Obviously he is having a difficult time with the feeling of these wounds. Fortunately there does not appear to be any  evidence of local infection such as significant amount of erythema or streaking. She also has an audio, vomiting, or diarrhea. He is not have any discomfort at this point. 10/30/2016 -- the surgical review is tomorrow and I am awaiting the input of the surgeon. 11/16/2016 -- I had a call from his surgeon Dr. Payton Doughty, on phone number [phone] who was kind enough to call me regarding his care. After a thorough review and having consulted the abdominal wall reconstruction team they have decided that surgery will be too extensive and rather moribund for this gentleman and have asked him to continue with local care at the wound center. She will send me an official note via Epic. Electronic Signature(s) Signed: 01/05/2017 5:05:22 PM By: Worthy Keeler PA-C Entered By: Worthy Keeler on 01/05/2017 10:46:52 [name] ([id number]) -------------------------------------------------------------------------------- Physical Exam Details Patient Name: [name], [name] Date of Service: 01/05/2017 10:15 AM Medical Record Number: [id number] Patient Account Number: [id number] Date of Birth/Sex: 1946/03/08 (71 y.o. Male) Treating RN: Montey Hora Primary Care Provider: Thomes Lolling Other Clinician: Referring Provider: Thomes Lolling Treating Provider/Extender: Melburn Hake, Rohit Deloria Weeks in Treatment: 24 Constitutional Well-nourished and well-hydrated in no acute distress. Respiratory normal breathing without difficulty. clear to auscultation bilaterally. Cardiovascular regular rate and rhythm with normal S1, S2. Psychiatric this patient is able to make decisions and demonstrates good insight into disease process. Alert and Oriented x 3. pleasant and cooperative. Notes No sharp debridement was required on evaluation today. Patient has a fairly granular wound bed in regard to his left abdominal wounds. With that being said the lower abdominal wound does have a connecting bridge which  is making it  difficult to heal. Electronic Signature(s) Signed: 01/05/2017 5:05:22 PM By: Worthy Keeler PA-C Entered By: Worthy Keeler on 01/05/2017 10:47:48 [name] ([id number]) -------------------------------------------------------------------------------- Physician Orders Details Patient Name: [name] Date of Service: 01/05/2017 10:15 AM Medical Record Number: [id number] Patient Account Number: [id number] Date of Birth/Sex: January 24, 1946 (71 y.o. Male) Treating RN: Montey Hora Primary Care Provider: Thomes Lolling Other Clinician: Referring Provider: Thomes Lolling Treating Provider/Extender: Sharalyn Ink in Treatment: 44 Verbal / Phone Orders: No Diagnosis Coding ICD-10 Coding Code Description E11.622 Type 2 diabetes mellitus with other skin ulcer Unspecified open wound of abdominal wall, right upper quadrant without penetration into peritoneal S31.100A cavity, initial encounter Unspecified open wound of abdominal wall, epigastric region without penetration into peritoneal cavity, S31.102A initial encounter F17.228 Nicotine dependence, chewing tobacco, with other nicotine-induced disorders Wound Cleansing Wound #1 Right Abdomen - midline o Clean wound with Normal Saline. o May Shower, gently pat wound dry prior to applying new dressing. Wound #2 Left Abdomen - midline o Clean wound with Normal Saline. o May Shower, gently pat wound dry prior to applying new dressing. Anesthetic Wound #1 Right Abdomen - midline o Topical Lidocaine 4% cream applied to wound bed prior to debridement Wound #2 Left Abdomen - midline o Topical Lidocaine 4% cream applied to wound bed prior to debridement Skin Barriers/Peri-Wound Care Wound #1 Right Abdomen - midline o Skin Prep Wound #2 Left Abdomen - midline o Skin Prep Primary Wound Dressing Wound #1 Right Abdomen - midline o [place] Non-Adherent Wound #2 Left Abdomen - midline o [place]  Non-Adherent Secondary Dressing Wound #1 Right Abdomen - midline o Other - telfa [address], [place] ([id number]) Wound #2 Left Abdomen - midline o Other - t[place]a [place] Dressing Change Frequency Wound #1 Right Abdomen - midline o Change dressing every other day. Wound #2 Left Abdomen - midline o Change dressing every other day. Follow-up Appointments Wound #1 Right Abdomen - midline o Return Appointment in 2 weeks. Wound #2 Left Abdomen - midline o Return Appointment in 2 weeks. Additional Orders / Instructions Wound #1 Right Abdomen - midline o Other: - Go see a Engineer, drilling about your hernias and get a surgical opinion. Wound #2 Left Abdomen - midline o Other: - Go see a Engineer, drilling about your hernias and get a surgical opinion. Electronic Signature(s) Signed: 01/05/2017 4:32:17 PM By: Montey Hora Signed: 01/05/2017 5:05:22 PM By: Worthy Keeler PA-C Entered By: Montey Hora on 01/05/2017 10:41:21 [name] ([id number]) -------------------------------------------------------------------------------- Problem List Details Patient Name: LINTON, STOLP Date of Service: 01/05/2017 10:15 AM Medical Record Number: [id number] Patient Account Number: [id number] Date of Birth/Sex: 1945/12/13 (71 y.o. Male) Treating RN: Montey Hora Primary Care Provider: Thomes Lolling Other Clinician: Referring Provider: Thomes Lolling Treating Provider/Extender: Sharalyn Ink in Treatment: 26 Active Problems ICD-10 Encounter Code Description Active Date Diagnosis E11.622 Type 2 diabetes mellitus with other skin ulcer 07/03/2016 Yes S31.100A Unspecified open wound of abdominal wall, right upper quadrant 07/03/2016 Yes without penetration into peritoneal cavity, initial encounter S31.102A Unspecified open wound of abdominal wall, epigastric region 07/03/2016 Yes without penetration into peritoneal cavity, initial encounter F17.228 Nicotine dependence, chewing  tobacco, with other nicotine-induced 07/03/2016 Yes disorders Inactive Problems Resolved Problems Electronic Signature(s) Signed: 01/05/2017 5:05:22 PM By: Worthy Keeler PA-C Entered By: Worthy Keeler on 01/05/2017 10:35:05 [name] ([id number]) -------------------------------------------------------------------------------- Progress Note Details Patient Name: [name] Date of Service: 01/05/2017 10:15 AM Medical  Record Number: [id number] Patient Account Number: [id number] Date of Birth/Sex: 1946/02/14 (71 y.o. Male) Treating RN: Montey Hora Primary Care Provider: Thomes Lolling Other Clinician: Referring Provider: Thomes Lolling Treating Provider/Extender: Melburn Hake, Glenroy Crossen Weeks in Treatment: 26 Subjective Chief Complaint Information obtained from Patient Patients presents for treatment of an open diabetic ulcer to the anterior abdominal wall in the epigastric and right upper quadrant History of Present Illness (HPI) The following HPI elements were documented for the patient's wound: Location: abdomen wall wounds,-- right upper quadrant and epigastric region Quality: Patient reports experiencing a dull pain to affected area(s). Severity: Patient states wound are getting worse. Duration: Patient has had the wound for >5 year's prior to seeking treatment at the wound center Timing: Pain in wound is Intermittent (comes and goes Context: The wound appeared gradually over time Modifying Factors: Consults to this date include:not seen a surgeon and only sees his PCP for his medical complaints Associated Signs and Symptoms: Patient reports having increase discharge. 71 year old gentleman has been seen recently by his PCP Dr. Thomes Lolling, who sees him with a history of diabetes mellitus, peripheral neuropathy, B12 deficiency, hypertension, osteoarthritis and the patient had recent blood sugar checked which was 163. Past medical history includes essential hypertension,  incisional hernia, diabetes mellitus, colon polyps, COPD, tobacco abuse in the past, chewing tobacco, GERD. He has also been treated for chronic back pain with no sciatica and is on oxycodone. His last hemoglobin A1c was 6.9% His abdominal surgery was done at [facility] over 15 years ago and gradually there was sutures which protruded and then there was mesh which protruded. He washes this during his shower but does not put any dressing and the wound is covered with a lot of debris. He has never had a surgical consultation for this. 07/10/2016 -- the patient has spoken to his PCP who said he was going to call me but I have not yet had a phone conversation with the physician. From what I understand the physician was reluctant to have a surgical opinion because of the patient's several comorbidities and he feels that the surgery may be detrimental to his overall health 07/24/2016 -- the patient's daughter is at the bedside and we've had a detailed discussion regarding his options for a surgical opinion and possible surgical intervention before this becomes a emergent situation. She will seek primary care input and take the father to [facility] for a surgical opinion. 07/31/2016 -- the patient's daughter has organized for a surgical opinion at [facility] on June 14 08/14/2016 -- it has been about 3 weeks since he has stopped chewing tobacco. 08/28/2016 -- he was seen at [facility] by Dr. Roby Lofts -- the assessment was based on physical examination and review of his previous history of surgical repair. the patient had undergone a ventral hernia repair in 1998 with Dr. Quay Burow at [facility] at a prior open cholecystectomy incision. he was evaluated by Dr. Denice Paradise in 2013 and at that time it was decided to watch and wait. Dr. Payton Doughty recommended a repeat CT scan and requested records from dura and regional operative records and would follow-up the patient, after Mr. Dellarocco PCP Dr. Gwenlyn Saran  reviewed him for overall fitness for surgery. 09/04/16 patient's wound actually appear to be doing well on evaluation today. He is not really experience any discomfort and the right abdominal wound is dry and almost appears to be healing up. He continues to have some drainage from the left but this seems  to be more of a potential seroma which with milking is able to be cleared out. I discussed with patient that when he performs his dressing changes at home it would be beneficial for him to do this as well. Otherwise there's no evidence of [name], [name]. ([id number]) infection. He is still waiting on his CT scan and appointments to determine whether he is going to have another surgery. 09/11/2016 -- the patient has had a CT scan and is awaiting his surgical appointment prior to deciding on any surgical intervention. 09/25/16 patient presents today for fault evaluation concerning his ongoing abdominal surgical wounds. Unfortunately they do not appear to be improving significantly but rather fluctuate from a little better to little worse week by week. Obviously this is somewhat frustrating for him. 10/02/2016 -- the patient has completed his CT scan, and has a review by the surgeons at the end of August. He has a new area which has opened up a little below his left abdominal wound. 10/16/16 on evaluation today patient's wounds on the abdominal region appears to be doing about the same. He actually has an appointment with his surgeon on the 21st for evaluation regarding the required products appointment. Obviously he is having a difficult time with the feeling of these wounds. Fortunately there does not appear to be any evidence of local infection such as significant amount of erythema or streaking. She also has an audio, vomiting, or diarrhea. He is not have any discomfort at this point. 10/30/2016 -- the surgical review is tomorrow and I am awaiting the input of the surgeon. 11/16/2016 -- I had a call  from his surgeon Dr. Payton Doughty, on phone number [phone] who was kind enough to call me regarding his care. After a thorough review and having consulted the abdominal wall reconstruction team they have decided that surgery will be too extensive and rather moribund for this gentleman and have asked him to continue with local care at the wound center. She will send me an official note via Epic. Patient History Information obtained from Patient. Social History Former smoker - chew tobacco, Marital Status - Widowed, Alcohol Use - Never, Drug Use - No History, Caffeine Use - Daily. 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 Respiratory The patient has no complaints or symptoms. Cardiovascular The patient has no complaints or symptoms. Psychiatric Denies complaints or symptoms of Anxiety. Objective Constitutional Well-nourished and well-hydrated in no acute distress. [name], [name] ([id number]) Vitals Time Taken: 10:22 AM, Height: 65 in, Weight: 161 lbs, BMI: 26.8, Temperature: 98.3 F, Pulse: 79 bpm, Respiratory Rate: 18 breaths/min, Blood Pressure: 134/58 mmHg. Respiratory normal breathing without difficulty. clear to auscultation bilaterally. Cardiovascular regular rate and rhythm with normal S1, S2. Psychiatric this patient is able to make decisions and demonstrates good insight into disease process. Alert and Oriented x 3. pleasant and cooperative. General Notes: No sharp debridement was required on evaluation today. Patient has a fairly granular wound bed in regard to his left abdominal wounds. With that being said the lower abdominal wound does have a connecting bridge which is making it difficult to heal. Integumentary (Hair, Skin) Wound #1 status is Open. Original cause of wound was Gradually Appeared. The wound is  located on the Right Abdomen - midline. The wound measures 0.1cm length x 0.1cm width x 0.1cm depth; 0.008cm^2 area and 0.001cm^3 volume. The wound is  limited to skin breakdown. There is no tunneling or undermining noted. There is a medium amount of serosanguineous drainage noted. The wound margin is flat and intact. There is large (67-100%) pink granulation within the wound bed. There is no necrotic tissue within the wound bed. The periwound skin appearance exhibited: Scarring. The periwound skin appearance did not exhibit: Callus, Crepitus, Excoriation, Induration, Rash, Dry/Scaly, Maceration, Atrophie Blanche, Cyanosis, Ecchymosis, Hemosiderin Staining, Mottled, Pallor, Rubor, Erythema. Periwound temperature was noted as No Abnormality. Wound #2 status is Open. Original cause of wound was Gradually Appeared. The wound is located on the Left Abdomen - midline. The wound measures 5.1cm length x 0.7cm width x 0.1cm depth; 2.804cm^2 area and 0.28cm^3 volume. The wound is limited to skin breakdown. There is no tunneling or undermining noted. There is a large amount of purulent drainage noted. The wound margin is flat and intact. There is medium (34-66%) red granulation within the wound bed. There is a medium (34- 66%) amount of necrotic tissue within the wound bed including Eschar and Adherent Slough. The periwound skin appearance exhibited: Induration, Scarring, Ecchymosis. The periwound skin appearance did not exhibit: Callus, Crepitus, Excoriation, Rash, Dry/Scaly, Maceration, Atrophie Blanche, Cyanosis, Hemosiderin Staining, Mottled, Pallor, Rubor, Erythema. Periwound temperature was noted as No Abnormality. Assessment Active Problems ICD-10 E11.622 - Type 2 diabetes mellitus with other skin ulcer S31.100A - Unspecified open wound of abdominal wall, right upper quadrant without penetration into peritoneal cavity, initial encounter S31.102A - Unspecified open wound of abdominal wall,  epigastric region without penetration into peritoneal cavity, initial encounter F17.228 - Nicotine dependence, chewing tobacco, with other nicotine-induced disorders [name], [name] ([id number]) Plan Wound Cleansing: Wound #1 Right Abdomen - midline: Clean wound with Normal Saline. May Shower, gently pat wound dry prior to applying new dressing. Wound #2 Left Abdomen - midline: Clean wound with Normal Saline. May Shower, gently pat wound dry prior to applying new dressing. Anesthetic: Wound #1 Right Abdomen - midline: Topical Lidocaine 4% cream applied to wound bed prior to debridement Wound #2 Left Abdomen - midline: Topical Lidocaine 4% cream applied to wound bed prior to debridement Skin Barriers/Peri-Wound Care: Wound #1 Right Abdomen - midline: Skin Prep Wound #2 Left Abdomen - midline: Skin Prep Primary Wound Dressing: Wound #1 Right Abdomen - midline: [place] Non-Adherent Wound #2 Left Abdomen - midline: [place] Non-Adherent Secondary Dressing: Wound #1 Right Abdomen - midline: Other - [place] Wound #2 Left Abdomen - midline: Other - telfa [place] Dressing Change Frequency: Wound #1 Right Abdomen - midline: Change dressing every other day. Wound #2 Left Abdomen - midline: Change dressing every other day. Follow-up Appointments: Wound #1 Right Abdomen - midline: Return Appointment in 2 weeks. Wound #2 Left Abdomen - midline: Return Appointment in 2 weeks. Additional Orders / Instructions: Wound #1 Right Abdomen - midline: Other: - Go see a Engineer, drilling about your hernias and get a surgical opinion. Wound #2 Left Abdomen - midline: Other: - Go see a Engineer, drilling about your hernias and get a surgical opinion. I am going to recommend that we continue with the Current wound care measures per above. We will see him for reevaluation in two weeks time. If anything worsens significantly in the interim patient will contact our office for additional recommendations.  Otherwise hopefully this will continue to show signs of improvement and healing. Electronic Signature(s) [name], [name] ([id number]) Signed: 01/05/2017 5:05:22 PM By: Worthy Keeler PA-C Entered By: Worthy Keeler on 01/05/2017 10:48:22 [name], [name] ([id number]) --------------------------------------------------------------------------------  ROS/PFSH Details Patient Name: [name], [name]. Date of Service: 01/05/2017 10:15 AM Medical Record Number: [id number] Patient Account Number: [id number] Date of Birth/Sex: April 13, 1945 (71 y.o. Male) Treating RN: Montey Hora Primary Care Provider: Thomes Lolling Other Clinician: Referring Provider: Thomes Lolling Treating Provider/Extender: Melburn Hake, Marshaun Lortie Weeks in Treatment: 26 Information Obtained From Patient Wound History Do you currently have one or more open woundso Yes How many open wounds do you currently haveo 2 Approximately how long have you had your woundso more than 1 year How have you been treating your wound(s) until nowo open to air Has your wound(s) ever healed and then re-openedo No Have you had any lab work done in the past montho No Have you tested positive for an antibiotic resistant organism (MRSA, VRE)o No Have you tested positive for osteomyelitis (bone infection)o No Have you had any tests for circulation on your legso No Constitutional Symptoms (General Health) Complaints and Symptoms: Negative for: Fever; Chills Psychiatric Complaints and Symptoms: Negative for: Anxiety Medical History: Negative for: Anorexia/bulimia; Confinement Anxiety Eyes Medical History: Positive for: Cataracts - removed Hematologic/Lymphatic Medical History: Negative for: Anemia; Hemophilia; Human Immunodeficiency Virus; Lymphedema; Sickle Cell Disease Respiratory Complaints and Symptoms: No Complaints or Symptoms Medical History: Positive for: Chronic Obstructive Pulmonary Disease (COPD) Negative for: Aspiration; Asthma;  Pneumothorax; Sleep Apnea; Tuberculosis Cardiovascular Complaints and Symptoms: No Complaints or Symptoms [name], [name] ([id number]) Medical History: Positive for: Hypertension Negative for: Angina; Arrhythmia; Congestive Heart Failure; Coronary Artery Disease; Deep Vein Thrombosis; Hypotension; Myocardial Infarction; Peripheral Arterial Disease; Peripheral Venous Disease; Phlebitis; Vasculitis Gastrointestinal Medical History: Negative for: Cirrhosis ; Colitis; Crohnos; Hepatitis A; Hepatitis B; Hepatitis C Endocrine Medical History: Positive for: Type II Diabetes Treated with: Oral agents Blood sugar tested every day: No Genitourinary Medical History: Negative for: End Stage Renal Disease Immunological Medical History: Negative for: Lupus Erythematosus; Raynaudos; Scleroderma Integumentary (Skin) Medical History: Negative for: History of Burn; History of pressure wounds Musculoskeletal Medical History: Positive for: Osteoarthritis Negative for: Gout; Rheumatoid Arthritis; Osteomyelitis Neurologic Medical History: Positive for: Neuropathy Negative for: Dementia; Quadriplegia; Paraplegia; Seizure Disorder Oncologic Medical History: Negative for: Received Chemotherapy; Received Radiation Past Medical History Notes: 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 HBO Extended History Items Eyes: Cataracts Immunizations Pneumococcal Vaccine: Received Pneumococcal Vaccination: Yes [name], [name] ([id number]) Immunization Notes: up to date Implantable Devices Family and Social History Former smoker - chew tobacco; Marital Status - Widowed; Alcohol Use: Never; Drug Use: No History; Caffeine Use: Daily; Financial Concerns: No; Food, Clothing or Shelter Needs: No; Support System Lacking: No; Transportation Concerns: No; Advanced Directives: No; Patient does not want information on Advanced Directives Physician Affirmation I have reviewed and agree with the above  information. Electronic Signature(s) Signed: 01/05/2017 4:32:17 PM By: Montey Hora Signed: 01/05/2017 5:05:22 PM By: Worthy Keeler PA-C Entered By: Worthy Keeler on 01/05/2017 10:47:30 [name] ([id number]) -------------------------------------------------------------------------------- SuperBill Details Patient Name: RUSS, LOOPER Date of Service: 01/05/2017 Medical Record Number: [id number] Patient Account Number: [id number] Date of Birth/Sex: 02/22/1946 (71 y.o. Male) Treating RN: Montey Hora Primary Care Provider: Thomes Lolling Other Clinician: Referring Provider: Thomes Lolling Treating Provider/Extender: Sharalyn Ink in Treatment: 26 Diagnosis Coding ICD-10 Codes Code Description E11.622 Type 2 diabetes mellitus with other skin ulcer Unspecified open wound of abdominal wall, right upper quadrant without penetration into peritoneal S31.100A cavity, initial encounter Unspecified open wound of abdominal wall, epigastric region without penetration into peritoneal cavity, S31.102A initial encounter F17.228 Nicotine dependence, chewing tobacco, with  other nicotine-induced disorders Facility Procedures CPT4 Code: [id number] Description: [id number] - WOUND CARE VISIT-LEV 3 EST PT Modifier: Quantity: 1 Physician Procedures CPT4: Description Modifier Quantity Code [id number] - WC PHYS LEVEL 3 - EST PT 1 ICD-10 Diagnosis Description E11.622 Type 2 diabetes mellitus with other skin ulcer S31.100A Unspecified open wound of abdominal wall, right upper quadrant without  penetration into peritoneal cavity, initial encounter S31.102A Unspecified open wound of abdominal wall, epigastric region without penetration into peritoneal cavity, initial encounter F17.228 Nicotine dependence, chewing tobacco, with other  nicotine-induced disorders Electronic Signature(s) Signed: 01/05/2017 10:51:50 AM By: Montey Hora Signed: 01/05/2017 5:05:22 PM By: Worthy Keeler  PA-C Entered By: Montey Hora on 01/05/2017 10:51:50

## 2017-01-19 ENCOUNTER — Encounter: Payer: Medicare HMO | Attending: Surgery | Admitting: Surgery

## 2017-01-19 DIAGNOSIS — S31100A Unspecified open wound of abdominal wall, right upper quadrant without penetration into peritoneal cavity, initial encounter: Secondary | ICD-10-CM | POA: Diagnosis not present

## 2017-01-19 DIAGNOSIS — Z79899 Other long term (current) drug therapy: Secondary | ICD-10-CM | POA: Insufficient documentation

## 2017-01-19 DIAGNOSIS — M1990 Unspecified osteoarthritis, unspecified site: Secondary | ICD-10-CM | POA: Diagnosis not present

## 2017-01-19 DIAGNOSIS — I10 Essential (primary) hypertension: Secondary | ICD-10-CM | POA: Insufficient documentation

## 2017-01-19 DIAGNOSIS — G8929 Other chronic pain: Secondary | ICD-10-CM | POA: Diagnosis not present

## 2017-01-19 DIAGNOSIS — F17228 Nicotine dependence, chewing tobacco, with other nicotine-induced disorders: Secondary | ICD-10-CM | POA: Insufficient documentation

## 2017-01-19 DIAGNOSIS — Z79891 Long term (current) use of opiate analgesic: Secondary | ICD-10-CM | POA: Diagnosis not present

## 2017-01-19 DIAGNOSIS — E538 Deficiency of other specified B group vitamins: Secondary | ICD-10-CM | POA: Insufficient documentation

## 2017-01-19 DIAGNOSIS — E11622 Type 2 diabetes mellitus with other skin ulcer: Secondary | ICD-10-CM | POA: Diagnosis present

## 2017-01-19 DIAGNOSIS — Z7984 Long term (current) use of oral hypoglycemic drugs: Secondary | ICD-10-CM | POA: Insufficient documentation

## 2017-01-19 DIAGNOSIS — K219 Gastro-esophageal reflux disease without esophagitis: Secondary | ICD-10-CM | POA: Diagnosis not present

## 2017-01-19 DIAGNOSIS — M549 Dorsalgia, unspecified: Secondary | ICD-10-CM | POA: Insufficient documentation

## 2017-01-19 DIAGNOSIS — S31102A Unspecified open wound of abdominal wall, epigastric region without penetration into peritoneal cavity, initial encounter: Secondary | ICD-10-CM | POA: Diagnosis not present

## 2017-01-19 DIAGNOSIS — J449 Chronic obstructive pulmonary disease, unspecified: Secondary | ICD-10-CM | POA: Diagnosis not present

## 2017-01-19 DIAGNOSIS — E1142 Type 2 diabetes mellitus with diabetic polyneuropathy: Secondary | ICD-10-CM | POA: Diagnosis not present

## 2017-01-19 DIAGNOSIS — X58XXXA Exposure to other specified factors, initial encounter: Secondary | ICD-10-CM | POA: Insufficient documentation

## 2017-01-21 NOTE — Progress Notes (Signed)
NYAIRE, DENBLEYKER ([id number]) Visit Report for 01/19/2017 Arrival Information Details Patient Name: [name], [name]. Date of Service: 01/19/2017 10:15 AM Medical Record Number: [id number] Patient Account Number: [id number] Date of Birth/Sex: [birth year] (71 y.o. Male) Treating RN: Montey Hora Primary Care Kaytee Taliercio: Thomes Lolling Other Clinician: Referring Rihana Kiddy: Thomes Lolling Treating Avir Deruiter/Extender: Frann Rider in Treatment: 28 Visit Information History Since Last Visit Added or deleted any medications: No Patient Arrived: Ambulatory Any new allergies or adverse reactions: No Arrival Time: 10:22 Had a fall or experienced change in No Accompanied By: self activities of daily living that may affect Transfer Assistance: None risk of falls: Patient Identification Verified: Yes Signs or symptoms of abuse/neglect since last visito No Secondary Verification Process Completed: Yes Hospitalized since last visit: No Patient Requires Transmission-Based No Has Dressing in Place as Prescribed: Yes Precautions: Pain Present Now: No Patient Has Alerts: Yes Patient Alerts: DMII Electronic Signature(s) Signed: 01/19/2017 4:50:52 PM By: Montey Hora Entered By: Montey Hora on 01/19/2017 10:23:07 [name] ([id number]) -------------------------------------------------------------------------------- Clinic Level of Care Assessment Details Patient Name: [name] Date of Service: 01/19/2017 10:15 AM Medical Record Number: [id number] Patient Account Number: [id number] Date of Birth/Sex: 14-May-1945 (71 y.o. Male) Treating RN: Montey Hora Primary Care Rayni Nemitz: Thomes Lolling Other Clinician: Referring Evanee Lubrano: Thomes Lolling Treating Daquavion Catala/Extender: Frann Rider in Treatment: 28 Clinic Level of Care Assessment Items TOOL 4 Quantity Score []  - Use when only an EandM is performed on FOLLOW-UP visit 0 ASSESSMENTS - Nursing Assessment / Reassessment X -  Reassessment of Co-morbidities (includes updates in patient status) 1 10 X- 1 5 Reassessment of Adherence to Treatment Plan ASSESSMENTS - Wound and Skin Assessment / Reassessment []  - Simple Wound Assessment / Reassessment - one wound 0 X- 2 5 Complex Wound Assessment / Reassessment - multiple wounds []  - 0 Dermatologic / Skin Assessment (not related to wound area) ASSESSMENTS - Focused Assessment []  - Circumferential Edema Measurements - multi extremities 0 []  - 0 Nutritional Assessment / Counseling / Intervention []  - 0 Lower Extremity Assessment (monofilament, tuning fork, pulses) []  - 0 Peripheral Arterial Disease Assessment (using hand held doppler) ASSESSMENTS - Ostomy and/or Continence Assessment and Care []  - Incontinence Assessment and Management 0 []  - 0 Ostomy Care Assessment and Management (repouching, etc.) PROCESS - Coordination of Care X - Simple Patient / Family Education for ongoing care 1 15 []  - 0 Complex (extensive) Patient / Family Education for ongoing care []  - 0 Staff obtains Programmer, systems, Records, Test Results / Process Orders []  - 0 Staff telephones HHA, Nursing Homes / Clarify orders / etc []  - 0 Routine Transfer to another Facility (non-emergent condition) []  - 0 Routine Hospital Admission (non-emergent condition) []  - 0 New Admissions / Biomedical engineer / Ordering NPWT, Apligraf, etc. []  - 0 Emergency Hospital Admission (emergent condition) X- 1 10 Simple Discharge Coordination MOHD., DERFLINGER ([id number]) []  - 0 Complex (extensive) Discharge Coordination PROCESS - Special Needs []  - Pediatric / Minor Patient Management 0 []  - 0 Isolation Patient Management []  - 0 Hearing / Language / Visual special needs []  - 0 Assessment of Community assistance (transportation, D/C planning, etc.) []  - 0 Additional assistance / Altered mentation []  - 0 Support Surface(s) Assessment (bed, cushion, seat, etc.) INTERVENTIONS - Wound Cleansing /  Measurement []  - Simple Wound Cleansing - one wound 0 X- 2 5 Complex Wound Cleansing - multiple wounds X- 1 5 Wound Imaging (photographs - any number of wounds) []  - 0 Wound Tracing (  instead of photographs) []  - 0 Simple Wound Measurement - one wound X- 2 5 Complex Wound Measurement - multiple wounds INTERVENTIONS - Wound Dressings X - Small Wound Dressing one or multiple wounds 2 10 []  - 0 Medium Wound Dressing one or multiple wounds []  - 0 Large Wound Dressing one or multiple wounds []  - 0 Application of Medications - topical []  - 0 Application of Medications - injection INTERVENTIONS - Miscellaneous []  - External ear exam 0 []  - 0 Specimen Collection (cultures, biopsies, blood, body fluids, etc.) []  - 0 Specimen(s) / Culture(s) sent or taken to Lab for analysis []  - 0 Patient Transfer (multiple staff / Civil Service fast streamer / Similar devices) []  - 0 Simple Staple / Suture removal (25 or less) []  - 0 Complex Staple / Suture removal (26 or more) []  - 0 Hypo / Hyperglycemic Management (close monitor of Blood Glucose) []  - 0 Ankle / Brachial Index (ABI) - do not check if billed separately X- 1 5 Vital Signs [name], [name]. ([id number]) Has the patient been seen at the hospital within the last three years: Yes Total Score: 100 Level Of Care: New/Established - Level 3 Electronic Signature(s) Signed: 01/19/2017 4:50:52 PM By: Montey Hora Entered By: Montey Hora on 01/19/2017 11:02:21 [name] ([id number]) -------------------------------------------------------------------------------- Encounter Discharge Information Details Patient Name: [name] Date of Service: 01/19/2017 10:15 AM Medical Record Number: [id number] Patient Account Number: [id number] Date of Birth/Sex: 08/28/1945 (71 y.o. Male) Treating RN: Montey Hora Primary Care Stepfon Rawles: Thomes Lolling Other Clinician: Referring Railyn House: Thomes Lolling Treating Shene Maxfield/Extender: Frann Rider in  Treatment: 44 Encounter Discharge Information Items Discharge Pain Level: 0 Discharge Condition: Stable Ambulatory Status: Ambulatory Discharge Destination: Home Transportation: Private Auto Accompanied By: self Schedule Follow-up Appointment: Yes Medication Reconciliation completed and No provided to Patient/Care Ulas Zuercher: Provided on Clinical Summary of Care: 01/19/2017 Form Type Recipient Paper Patient FB Electronic Signature(s) Signed: 01/19/2017 11:04:12 AM By: Montey Hora Entered By: Montey Hora on 01/19/2017 [place] ([id number]) -------------------------------------------------------------------------------- Multi Wound Chart Details Patient Name: [name] Date of Service: 01/19/2017 10:15 AM Medical Record Number: [id number] Patient Account Number: [id number] Date of Birth/Sex: [birth year] (71 y.o. Male) Treating RN: Montey Hora Primary Care Auriel Kist: Thomes Lolling Other Clinician: Referring Minor Iden: Thomes Lolling Treating Maycee Blasco/Extender: Frann Rider in Treatment: 28 Vital Signs Height(in): 65 Pulse(bpm): 37 Weight(lbs): 161 Blood Pressure(mmHg): 139/65 Body Mass Index(BMI): 27 Temperature(F): 97.9 Respiratory Rate 18 (breaths/min): Photos: [1:No Photos] [2:No Photos] [N/A:N/A] Wound Location: [1:Right Abdomen - midline] [2:Left Abdomen - midline] [N/A:N/A] Wounding Event: [1:Gradually Appeared] [2:Gradually Appeared] [N/A:N/A] Primary Etiology: [1:Atypical] [2:Atypical] [N/A:N/A] Comorbid History: [1:Cataracts, Chronic Obstructive Cataracts, Chronic Obstructive N/A Pulmonary Disease (COPD), Pulmonary Disease (COPD), Hypertension, Type II Diabetes, Osteoarthritis, Neuropathy] [2:Hypertension, Type II Diabetes, Osteoarthritis,  Neuropathy] Date Acquired: [1:05/17/2015] [2:05/17/2015] [N/A:N/A] Weeks of Treatment: [1:28] [2:28] [N/A:N/A] Wound Status: [1:Open] [2:Open] [N/A:N/A] Clustered Wound: [1:No] [2:Yes]  [N/A:N/A] Clustered Quantity: [1:N/A] [2:3] [N/A:N/A] Measurements L x W x D [1:0.1x0.1x0.1] [2:5x0.5x0.1] [N/A:N/A] (cm) Area (cm) : [1:0.008] [2:1.963] [N/A:N/A] Volume (cm) : [1:0.001] [2:0.196] [N/A:N/A] % Reduction in Area: [1:99.80%] [2:62.80%] [N/A:N/A] % Reduction in Volume: [1:99.70%] [2:62.90%] [N/A:N/A] Classification: [1:Full Thickness Without Exposed Support Structures] [2:Full Thickness Without Exposed Support Structures] [N/A:N/A] Exudate Amount: [1:Medium] [2:Large] [N/A:N/A] Exudate Type: [1:Serosanguineous] [2:Purulent] [N/A:N/A] Exudate Color: [1:red, [place]] [2:yellow, [place], green] [N/A:N/A] Wound Margin: [1:Flat and Intact] [2:Flat and Intact] [N/A:N/A] Granulation Amount: [1:Large (67-100%)] [2:Medium (34-66%)] [N/A:N/A] Granulation Quality: [1:Pink] [2:Red] [N/A:N/A] Necrotic Amount: [1:None Present (0%)] [2:Medium (34-66%)] [N/A:N/A]  Necrotic Tissue: [1:N/A] [2:Eschar, Adherent Slough] [N/A:N/A] Exposed Structures: [1:Fascia: No Fat Layer (Subcutaneous Tissue) Exposed: No Tendon: No Muscle: No Joint: No] [2:Fascia: No Fat Layer (Subcutaneous Tissue) Exposed: No Tendon: No Muscle: No Joint: No] [N/A:N/A] Bone: No Bone: No Limited to Skin Breakdown Limited to Skin Breakdown Epithelialization: Medium (34-66%) Small (1-33%) N/A Periwound Skin Texture: Scarring: Yes Induration: Yes N/A Excoriation: No Scarring: Yes Induration: No Excoriation: No Callus: No Callus: No Crepitus: No Crepitus: No Rash: No Rash: No Periwound Skin Moisture: Maceration: No Maceration: No N/A Dry/Scaly: No Dry/Scaly: No Periwound Skin Color: Atrophie Blanche: No Ecchymosis: Yes N/A Cyanosis: No Atrophie Blanche: No Ecchymosis: No Cyanosis: No Erythema: No Erythema: No Hemosiderin Staining: No Hemosiderin Staining: No Mottled: No Mottled: No Pallor: No Pallor: No Rubor: No Rubor: No Temperature: No Abnormality No Abnormality N/A Tenderness on Palpation: No No  N/A Wound Preparation: Ulcer Cleansing: Ulcer Cleansing: N/A Rinsed/Irrigated with Saline Rinsed/Irrigated with Saline Topical Anesthetic Applied: Topical Anesthetic Applied: Other: lidocaine 4% Other: lidocaine 4% Treatment Notes Electronic Signature(s) Signed: 01/19/2017 10:39:50 AM By: Christin Fudge MD, FACS Entered By: Christin Fudge on 01/19/2017 10:39:50 [name] ([id number]) -------------------------------------------------------------------------------- [place] Details Patient Name: [name], [name] Date of Service: 01/19/2017 10:15 AM Medical Record Number: [id number] Patient Account Number: [id number] Date of Birth/Sex: [birth year] (71 y.o. Male) Treating RN: Montey Hora Primary Care Brantlee Penn: Thomes Lolling Other Clinician: Referring Caedmon Louque: Thomes Lolling Treating Arish Redner/Extender: Frann Rider in Treatment: 28 Active Inactive ` Abuse / Safety / Falls / Self Care Management Nursing Diagnoses: Impaired physical mobility Goals: Patient will remain injury free Date Initiated: 07/03/2016 Target Resolution Date: 09/14/2016 Goal Status: Active Interventions: Assess fall risk on admission and as needed Notes: ` Orientation to the Wound Care Program Nursing Diagnoses: Knowledge deficit related to the wound healing center program Goals: Patient/caregiver will verbalize understanding of the [place] Date Initiated: 07/03/2016 Target Resolution Date: 09/15/2016 Goal Status: Active Interventions: Provide education on orientation to the wound center Notes: ` Wound/Skin Impairment Nursing Diagnoses: Impaired tissue integrity Goals: Patient/caregiver will verbalize understanding of skin care regimen Date Initiated: 07/03/2016 Target Resolution Date: 09/15/2016 Goal Status: Active Ulcer/skin breakdown will have a volume reduction of 30% by week 4 Date Initiated: 07/03/2016 Target Resolution Date: 09/15/2016 [name], [name] ([id number]) Goal Status: Active Ulcer/skin breakdown will have a volume reduction of 50% by week 8 Date Initiated: 07/03/2016 Target Resolution Date: 09/15/2016 Goal Status: Active Ulcer/skin breakdown will have a volume reduction of 80% by week 12 Date Initiated: 07/03/2016 Target Resolution Date: 09/15/2016 Goal Status: Active Ulcer/skin breakdown will heal within 14 weeks Date Initiated: 07/03/2016 Target Resolution Date: 09/15/2016 Goal Status: Active Interventions: Assess patient/caregiver ability to obtain necessary supplies Assess patient/caregiver ability to perform ulcer/skin care regimen upon admission and as needed Assess ulceration(s) every visit Notes: Electronic Signature(s) Signed: 01/19/2017 4:50:52 PM By: Montey Hora Entered By: Montey Hora on 01/19/2017 10:33:16 [name] ([id number]) -------------------------------------------------------------------------------- Pain Assessment Details Patient Name: [name] Date of Service: 01/19/2017 10:15 AM Medical Record Number: [id number] Patient Account Number: [id number] Date of Birth/Sex: 08-01-1945 (71 y.o. Male) Treating RN: Montey Hora Primary Care Bryor Rami: Thomes Lolling Other Clinician: Referring Oron Westrup: Thomes Lolling Treating Makari Portman/Extender: Frann Rider in Treatment: 28 Active Problems Location of Pain Severity and Description of Pain Patient Has Paino No Site Locations Pain Management and Medication Current Pain Management: Notes Topical or injectable lidocaine is offered to patient for acute pain when surgical debridement is performed. If needed,  Patient is instructed to use over the counter pain medication for the following 24-48 hours after debridement. Wound care MDs do not prescribed pain medications. Patient has chronic pain or uncontrolled pain. Patient has been instructed to make an appointment with their Primary Care Physician for pain management. Electronic  Signature(s) Signed: 01/19/2017 4:50:52 PM By: Montey Hora Entered By: Montey Hora on 01/19/2017 10:24:13 [name] ([id number]) -------------------------------------------------------------------------------- Patient/Caregiver Education Details Patient Name: [name], [name] Date of Service: 01/19/2017 10:15 AM Medical Record Number: [id number] Patient Account Number: [id number] Date of Birth/Gender: [birth year] (71 y.o. Male) Treating RN: Montey Hora Primary Care Physician: Thomes Lolling Other Clinician: Referring Physician: Thomes Lolling Treating Physician/Extender: Frann Rider in Treatment: 59 Education Assessment Education Provided To: Patient Education Topics Provided Wound/Skin Impairment: Handouts: Other: wound care s ordered Methods: Demonstration, Explain/Verbal Responses: State content correctly Electronic Signature(s) Signed: 01/19/2017 4:50:52 PM By: Montey Hora Entered By: Montey Hora on 01/19/2017 11:04:33 [name] ([id number]) -------------------------------------------------------------------------------- Wound Assessment Details Patient Name: [name] Date of Service: 01/19/2017 10:15 AM Medical Record Number: [id number] Patient Account Number: [id number] Date of Birth/Sex: 1945-10-08 (71 y.o. Male) Treating RN: Montey Hora Primary Care Cherylanne Ardelean: Thomes Lolling Other Clinician: Referring Esteven Overfelt: Thomes Lolling Treating Nayan Proch/Extender: Frann Rider in Treatment: 28 Wound Status Wound Number: 1 Primary Atypical Etiology: Wound Location: Right Abdomen - midline Wound Open Wounding Event: Gradually Appeared Status: Date Acquired: 05/17/2015 Comorbid Cataracts, Chronic Obstructive Pulmonary Weeks Of Treatment: 28 History: Disease (COPD), Hypertension, Type II Clustered Wound: No Diabetes, Osteoarthritis, Neuropathy Photos Photo Uploaded By: Montey Hora on 01/19/2017 11:40:00 Wound Measurements Length:  (cm) 0.1 Width: (cm) 0.1 Depth: (cm) 0.1 Area: (cm) 0.008 Volume: (cm) 0.001 % Reduction in Area: 99.8% % Reduction in Volume: 99.7% Epithelialization: Medium (34-66%) Tunneling: No Undermining: No Wound Description Full Thickness Without Exposed Support Foul O Classification: Structures Slough Wound Margin: Flat and Intact Exudate Medium Amount: Exudate Type: Serosanguineous Exudate Color: red, Dalia dor After Cleansing: No /Fibrino No Wound Bed Granulation Amount: Large (67-100%) Exposed Structure Granulation Quality: Pink Fascia Exposed: No Necrotic Amount: None Present (0%) Fat Layer (Subcutaneous Tissue) Exposed: No Tendon Exposed: No Muscle Exposed: No Joint Exposed: No Bone Exposed: No [name], ROCK. ([id number]) Limited to Skin Breakdown Periwound Skin Texture Texture Color No Abnormalities Noted: No No Abnormalities Noted: No Callus: No Atrophie Blanche: No Crepitus: No Cyanosis: No Excoriation: No Ecchymosis: No Induration: No Erythema: No Rash: No Hemosiderin Staining: No Scarring: Yes Mottled: No Pallor: No Moisture Rubor: No No Abnormalities Noted: No Dry / Scaly: No Temperature / Pain Maceration: No Temperature: No Abnormality Wound Preparation Ulcer Cleansing: Rinsed/Irrigated with Saline Topical Anesthetic Applied: Other: lidocaine 4%, Treatment Notes Wound #1 (Right Abdomen - midline) 1. Cleansed with: Clean wound with Normal Saline 2. Anesthetic Topical Lidocaine 4% cream to wound bed prior to debridement 4. Dressing Applied: Other dressing (specify in notes) Notes coverlet Electronic Signature(s) Signed: 01/19/2017 4:50:52 PM By: Montey Hora Entered By: Montey Hora on 01/19/2017 10:28:42 [name] ([id number]) -------------------------------------------------------------------------------- Wound Assessment Details Patient Name: [name] Date of Service: 01/19/2017 10:15 AM Medical Record Number:  [id number] Patient Account Number: [id number] Date of Birth/Sex: 12-29-1945 (71 y.o. Male) Treating RN: Montey Hora Primary Care Tamecia Mcdougald: Thomes Lolling Other Clinician: Referring Alonte Wulff: Thomes Lolling Treating Kolbi Altadonna/Extender: Frann Rider in Treatment: 28 Wound Status Wound Number: 2 Primary Atypical Etiology: Wound Location: Left Abdomen - midline Wound Open Wounding Event: Gradually Appeared Status: Date Acquired: 05/17/2015 Comorbid Cataracts, Chronic Obstructive Pulmonary  Weeks Of Treatment: 28 History: Disease (COPD), Hypertension, Type II Clustered Wound: Yes Diabetes, Osteoarthritis, Neuropathy Photos Photo Uploaded By: Montey Hora on 01/19/2017 11:40:00 Wound Measurements Length: (cm) 5 Width: (cm) 0.5 Depth: (cm) 0.1 Clustered Quantity: 3 Area: (cm) 1.963 Volume: (cm) 0.196 % Reduction in Area: 62.8% % Reduction in Volume: 62.9% Epithelialization: Small (1-33%) Tunneling: No Undermining: No Wound Description Full Thickness Without Exposed Support Foul O Classification: Structures Slough Wound Margin: Flat and Intact Exudate Large Amount: Exudate Type: Purulent Exudate Color: yellow, Bry, green dor After Cleansing: No /Fibrino No Wound Bed Granulation Amount: Medium (34-66%) Exposed Structure Granulation Quality: Red Fascia Exposed: No Necrotic Amount: Medium (34-66%) Fat Layer (Subcutaneous Tissue) Exposed: No Necrotic Quality: Eschar, Adherent Slough Tendon Exposed: No Muscle Exposed: No Joint Exposed: No [name], [name] ([id number]) Bone Exposed: No Limited to Skin Breakdown Periwound Skin Texture Texture Color No Abnormalities Noted: No No Abnormalities Noted: No Callus: No Atrophie Blanche: No Crepitus: No Cyanosis: No Excoriation: No Ecchymosis: Yes Induration: Yes Erythema: No Rash: No Hemosiderin Staining: No Scarring: Yes Mottled: No Pallor: No Moisture Rubor: No No Abnormalities Noted: No Dry /  Scaly: No Temperature / Pain Maceration: No Temperature: No Abnormality Wound Preparation Ulcer Cleansing: Rinsed/Irrigated with Saline Topical Anesthetic Applied: Other: lidocaine 4%, Treatment Notes Wound #2 (Left Abdomen - midline) 1. Cleansed with: Clean wound with Normal Saline 2. Anesthetic Topical Lidocaine 4% cream to wound bed prior to debridement 4. Dressing Applied: Other dressing (specify in notes) 5. Secondary Dressing Applied [place] Notes PolyMem silver Electronic Signature(s) Signed: 01/19/2017 4:50:52 PM By: Montey Hora Entered By: Montey Hora on 01/19/2017 10:29:00 [name] ([id number]) -------------------------------------------------------------------------------- [place] Details Patient Name: [name] Date of Service: 01/19/2017 10:15 AM Medical Record Number: [id number] Patient Account Number: [id number] Date of Birth/Sex: [birth year] (70 y.o. Male) Treating RN: Montey Hora Primary Care Laster Appling: Thomes Lolling Other Clinician: Referring Jacqueline Spofford: Thomes Lolling Treating Kemper Heupel/Extender: Frann Rider in Treatment: 28 Vital Signs Time Taken: 10:24 Temperature (F): 97.9 Height (in): 65 Pulse (bpm): 77 Weight (lbs): 161 Respiratory Rate (breaths/min): 18 Body Mass Index (BMI): 26.8 Blood Pressure (mmHg): 139/65 Reference Range: 80 - 120 mg / dl Electronic Signature(s) Signed: 01/19/2017 4:50:52 PM By: Montey Hora Entered By: Montey Hora on 01/19/2017 10:24:36

## 2017-01-21 NOTE — Progress Notes (Signed)
[name], [name] ([id number]) Visit Report for 01/19/2017 Chief Complaint Document Details Patient Name: [name]. Date of Service: 01/19/2017 10:15 AM Medical Record Number: [id number] Patient Account Number: [id number] Date of Birth/Sex: [birth year] (71 y.o. Male) Treating RN: Montey Hora Primary Care Provider: Thomes Lolling Other Clinician: Referring Provider: Thomes Lolling Treating Provider/Extender: Frann Rider in Treatment: 28 Information Obtained from: Patient Chief Complaint Patients presents for treatment of an open diabetic ulcer to the anterior abdominal wall in the epigastric and right upper quadrant Electronic Signature(s) Signed: 01/19/2017 10:39:58 AM By: Christin Fudge MD, FACS Entered By: Christin Fudge on 01/19/2017 10:39:57 [name] ([id number]) -------------------------------------------------------------------------------- HPI Details Patient Name: [name] Date of Service: 01/19/2017 10:15 AM Medical Record Number: [id number] Patient Account Number: [id number] Date of Birth/Sex: [birth year] (71 y.o. Male) Treating RN: Montey Hora Primary Care Provider: Thomes Lolling Other Clinician: Referring Provider: Thomes Lolling Treating Provider/Extender: Frann Rider in Treatment: 28 History of Present Illness Location: abdomen wall wounds,-- right upper quadrant and epigastric region Quality: Patient reports experiencing a dull pain to affected area(s). Severity: Patient states wound are getting worse. Duration: Patient has had the wound for >5 year's prior to seeking treatment at the wound center Timing: Pain in wound is Intermittent (comes and goes Context: The wound appeared gradually over time Modifying Factors: Consults to this date include:not seen a surgeon and only sees his PCP for his medical complaints Associated Signs and Symptoms: Patient reports having increase discharge. HPI Description: 71 year old gentleman has been seen  recently by his PCP Dr. Thomes Lolling, who sees him with a history of diabetes mellitus, peripheral neuropathy, B12 deficiency, hypertension, osteoarthritis and the patient had recent blood sugar checked which was 163. Past medical history includes essential hypertension, incisional hernia, diabetes mellitus, colon polyps, COPD, tobacco abuse in the past, chewing tobacco, GERD. He has also been treated for chronic back pain with no sciatica and is on oxycodone. His last hemoglobin A1c was 6.9% His abdominal surgery was done at [facility] over 15 years ago and gradually there was sutures which protruded and then there was mesh which protruded. He washes this during his shower but does not put any dressing and the wound is covered with a lot of debris. He has never had a surgical consultation for this. 07/10/2016 -- the patient has spoken to his PCP who said he was going to call me but I have not yet had a phone conversation with the physician. From what I understand the physician was reluctant to have a surgical opinion because of the patient's several comorbidities and he feels that the surgery may be detrimental to his overall health 07/24/2016 -- the patient's daughter is at the bedside and we've had a detailed discussion regarding his options for a surgical opinion and possible surgical intervention before this becomes a emergent situation. She will seek primary care input and take the father to [facility] for a surgical opinion. 07/31/2016 -- the patient's daughter has organized for a surgical opinion at [facility] on June 14 08/14/2016 -- it has been about 3 weeks since he has stopped chewing tobacco. 08/28/2016 -- he was seen at [facility] by Dr. Roby Lofts -- the assessment was based on physical examination and review of his previous history of surgical repair. the patient had undergone a ventral hernia repair in 1998 with Dr. Quay Burow at [facility] at a prior open  cholecystectomy incision. he was evaluated by Dr. Denice Paradise in 2013 and at that  time it was decided to watch and wait. Dr. Payton Doughty recommended a repeat CT scan and requested records from dura and regional operative records and would follow-up the patient, after Mr. Carriker PCP Dr. Gwenlyn Saran reviewed him for overall fitness for surgery. 09/04/16 patient's wound actually appear to be doing well on evaluation today. He is not really experience any discomfort and the right abdominal wound is dry and almost appears to be healing up. He continues to have some drainage from the left but this seems to be more of a potential seroma which with milking is able to be cleared out. I discussed with patient that when he performs his dressing changes at home it would be beneficial for him to do this as well. Otherwise there's no evidence of infection. He is still waiting on his CT scan and appointments to determine whether he is going to have another surgery. 09/11/2016 -- the patient has had a CT scan and is awaiting his surgical appointment prior to deciding on any surgical intervention. 09/25/16 patient presents today for fault evaluation concerning his ongoing abdominal surgical wounds. Unfortunately they do not appear to be improving significantly but rather fluctuate from a little better to little worse week by week. Obviously this is somewhat frustrating for him. [name], [name] ([id number]) 10/02/2016 -- the patient has completed his CT scan, and has a review by the surgeons at the end of August. He has a new area which has opened up a little below his left abdominal wound. 10/16/16 on evaluation today patient's wounds on the abdominal region appears to be doing about the same. He actually has an appointment with his surgeon on the 21st for evaluation regarding the required products appointment. Obviously he is having a difficult time with the feeling of these wounds. Fortunately there does not appear to be any  evidence of local infection such as significant amount of erythema or streaking. She also has an audio, vomiting, or diarrhea. He is not have any discomfort at this point. 10/30/2016 -- the surgical review is tomorrow and I am awaiting the input of the surgeon. 11/16/2016 -- I had a call from his surgeon Dr. Payton Doughty, on phone number [phone] who was kind enough to call me regarding his care. After a thorough review and having consulted the abdominal wall reconstruction team they have decided that surgery will be too extensive and rather moribund for this gentleman and have asked him to continue with local care at the wound center. She will send me an official note via Epic. Electronic Signature(s) Signed: 01/19/2017 10:40:04 AM By: Christin Fudge MD, FACS Entered By: Christin Fudge on 01/19/2017 10:40:04 [name] ([id number]) -------------------------------------------------------------------------------- Physical Exam Details Patient Name: [name], [name] Date of Service: 01/19/2017 10:15 AM Medical Record Number: [id number] Patient Account Number: [id number] Date of Birth/Sex: [birth year] (71 y.o. Male) Treating RN: Montey Hora Primary Care Provider: Thomes Lolling Other Clinician: Referring Provider: Thomes Lolling Treating Provider/Extender: Frann Rider in Treatment: 28 Constitutional . Pulse regular. Respirations normal and unlabored. Afebrile. . Eyes Nonicteric. Reactive to light. Ears, Nose, Mouth, and Throat Lips, teeth, and gums WNL.[name] Moist mucosa without lesions. Neck supple and nontender. No palpable supraclavicular or cervical adenopathy. Normal sized without goiter. Respiratory WNL. No retractions.. Cardiovascular Pedal Pulses WNL. No clubbing, cyanosis or edema. Lymphatic No adneopathy. No adenopathy. No adenopathy. Musculoskeletal Adexa without tenderness or enlargement.. Digits and nails w/o clubbing, cyanosis, infection, petechiae, ischemia,  or inflammatory conditions.. Integumentary (Hair, Skin) No suspicious lesions. No crepitus or  fluctuance. No peri-wound warmth or erythema. No masses.[name] Psychiatric Judgement and insight Intact.. No evidence of depression, anxiety, or agitation.. Notes the right upper quadrant wound is completely healed and the left epigastric area has a couple of small areas open and no sharp debridement was required today. We will try PolyMem silver to be used locally and changed every 3-4 days Electronic Signature(s) Signed: 01/19/2017 10:41:00 AM By: Christin Fudge MD, FACS Entered By: Christin Fudge on 01/19/2017 10:40:59 [name] ([id number]) -------------------------------------------------------------------------------- Physician Orders Details Patient Name: [name] Date of Service: 01/19/2017 10:15 AM Medical Record Number: [id number] Patient Account Number: [id number] Date of Birth/Sex: Mar 27, 1945 (71 y.o. Male) Treating RN: Montey Hora Primary Care Provider: Thomes Lolling Other Clinician: Referring Provider: Thomes Lolling Treating Provider/Extender: Frann Rider in Treatment: 4 Verbal / Phone Orders: No Diagnosis Coding Wound Cleansing Wound #1 Right Abdomen - midline o Clean wound with Normal Saline. o May Shower, gently pat wound dry prior to applying new dressing. Wound #2 Left Abdomen - midline o Clean wound with Normal Saline. o May Shower, gently pat wound dry prior to applying new dressing. Anesthetic Wound #1 Right Abdomen - midline o Topical Lidocaine 4% cream applied to wound bed prior to debridement Wound #2 Left Abdomen - midline o Topical Lidocaine 4% cream applied to wound bed prior to debridement Skin Barriers/Peri-Wound Care Wound #1 Right Abdomen - midline o Skin Prep Wound #2 Left Abdomen - midline o Skin Prep Primary Wound Dressing Wound #1 Right Abdomen - midline o Other: - PolyMem Silver Wound #2 Left Abdomen -  midline o Other: - PolyMem Silver Secondary Dressing Wound #1 Right Abdomen - midline o Other - [place] Wound #2 Left Abdomen - midline o Other - [place] Dressing Change Frequency Wound #1 Right Abdomen - midline o Change dressing every other day. Wound #2 Left Abdomen - midline o Change dressing every other day. [name], [name] ([id number]) Follow-up Appointments Wound #1 Right Abdomen - midline o Other: - 3 weeks Wound #2 Left Abdomen - midline o Other: - 3 weeks Electronic Signature(s) Signed: 01/19/2017 3:39:10 PM By: Christin Fudge MD, FACS Signed: 01/19/2017 4:50:52 PM By: Montey Hora Entered By: Montey Hora on 01/19/2017 10:37:26 [name] ([id number]) -------------------------------------------------------------------------------- Problem List Details Patient Name: LAKOTA, MARKGRAF. Date of Service: 01/19/2017 10:15 AM Medical Record Number: [id number] Patient Account Number: [id number] Date of Birth/Sex: December 24, 1945 (71 y.o. Male) Treating RN: Montey Hora Primary Care Provider: Thomes Lolling Other Clinician: Referring Provider: Thomes Lolling Treating Provider/Extender: Frann Rider in Treatment: 28 Active Problems ICD-10 Encounter Code Description Active Date Diagnosis E11.622 Type 2 diabetes mellitus with other skin ulcer 07/03/2016 Yes S31.100A Unspecified open wound of abdominal wall, right upper quadrant 07/03/2016 Yes without penetration into peritoneal cavity, initial encounter S31.102A Unspecified open wound of abdominal wall, epigastric region 07/03/2016 Yes without penetration into peritoneal cavity, initial encounter F17.228 Nicotine dependence, chewing tobacco, with other nicotine-induced 07/03/2016 Yes disorders Inactive Problems Resolved Problems Electronic Signature(s) Signed: 01/19/2017 10:39:46 AM By: Christin Fudge MD, FACS Entered By: Christin Fudge on 01/19/2017 10:39:45 [name]  ([id number]) -------------------------------------------------------------------------------- Progress Note Details Patient Name: [name] Date of Service: 01/19/2017 10:15 AM Medical Record Number: [id number] Patient Account Number: [id number] Date of Birth/Sex: 01-26-1946 (71 y.o. Male) Treating RN: Montey Hora Primary Care Provider: Thomes Lolling Other Clinician: Referring Provider: Thomes Lolling Treating Provider/Extender: Frann Rider in Treatment: 28 Subjective Chief Complaint Information obtained from Patient Patients presents for treatment of an  open diabetic ulcer to the anterior abdominal wall in the epigastric and right upper quadrant History of Present Illness (HPI) The following HPI elements were documented for the patient's wound: Location: abdomen wall wounds,-- right upper quadrant and epigastric region Quality: Patient reports experiencing a dull pain to affected area(s). Severity: Patient states wound are getting worse. Duration: Patient has had the wound for >5 year's prior to seeking treatment at the wound center Timing: Pain in wound is Intermittent (comes and goes Context: The wound appeared gradually over time Modifying Factors: Consults to this date include:not seen a surgeon and only sees his PCP for his medical complaints Associated Signs and Symptoms: Patient reports having increase discharge. 71 year old gentleman has been seen recently by his PCP Dr. Thomes Lolling, who sees him with a history of diabetes mellitus, peripheral neuropathy, B12 deficiency, hypertension, osteoarthritis and the patient had recent blood sugar checked which was 163. Past medical history includes essential hypertension, incisional hernia, diabetes mellitus, colon polyps, COPD, tobacco abuse in the past, chewing tobacco, GERD. He has also been treated for chronic back pain with no sciatica and is on oxycodone. His last hemoglobin A1c was 6.9% His abdominal surgery was  done at [facility] over 15 years ago and gradually there was sutures which protruded and then there was mesh which protruded. He washes this during his shower but does not put any dressing and the wound is covered with a lot of debris. He has never had a surgical consultation for this. 07/10/2016 -- the patient has spoken to his PCP who said he was going to call me but I have not yet had a phone conversation with the physician. From what I understand the physician was reluctant to have a surgical opinion because of the patient's several comorbidities and he feels that the surgery may be detrimental to his overall health 07/24/2016 -- the patient's daughter is at the bedside and we've had a detailed discussion regarding his options for a surgical opinion and possible surgical intervention before this becomes a emergent situation. She will seek primary care input and take the father to [facility] for a surgical opinion. 07/31/2016 -- the patient's daughter has organized for a surgical opinion at [facility] on June 14 08/14/2016 -- it has been about 3 weeks since he has stopped chewing tobacco. 08/28/2016 -- he was seen at [facility] by Dr. Roby Lofts -- the assessment was based on physical examination and review of his previous history of surgical repair. the patient had undergone a ventral hernia repair in 1998 with Dr. Quay Burow at [facility] at a prior open cholecystectomy incision. he was evaluated by Dr. Denice Paradise in 2013 and at that time it was decided to watch and wait. Dr. Payton Doughty recommended a repeat CT scan and requested records from dura and regional operative records and would follow-up the patient, after Mr. Malina PCP Dr. Gwenlyn Saran reviewed him for overall fitness for surgery. 09/04/16 patient's wound actually appear to be doing well on evaluation today. He is not really experience any discomfort and the right abdominal wound is dry and almost appears to be healing up. He continues  to have some drainage from the left but this seems to be more of a potential seroma which with milking is able to be cleared out. I discussed with patient that when he performs his dressing changes at home it would be beneficial for him to do this as well. Otherwise there's no evidence of [name], [name]. ([id number]) infection. He  is still waiting on his CT scan and appointments to determine whether he is going to have another surgery. 09/11/2016 -- the patient has had a CT scan and is awaiting his surgical appointment prior to deciding on any surgical intervention. 09/25/16 patient presents today for fault evaluation concerning his ongoing abdominal surgical wounds. Unfortunately they do not appear to be improving significantly but rather fluctuate from a little better to little worse week by week. Obviously this is somewhat frustrating for him. 10/02/2016 -- the patient has completed his CT scan, and has a review by the surgeons at the end of August. He has a new area which has opened up a little below his left abdominal wound. 10/16/16 on evaluation today patient's wounds on the abdominal region appears to be doing about the same. He actually has an appointment with his surgeon on the 21st for evaluation regarding the required products appointment. Obviously he is having a difficult time with the feeling of these wounds. Fortunately there does not appear to be any evidence of local infection such as significant amount of erythema or streaking. She also has an audio, vomiting, or diarrhea. He is not have any discomfort at this point. 10/30/2016 -- the surgical review is tomorrow and I am awaiting the input of the surgeon. 11/16/2016 -- I had a call from his surgeon Dr. Payton Doughty, on phone number [phone] who was kind enough to call me regarding his care. After a thorough review and having consulted the abdominal wall reconstruction team they have decided that surgery will be too extensive and  rather moribund for this gentleman and have asked him to continue with local care at the wound center. She will send me an official note via Epic. Patient History Information obtained from Patient. Social History Former smoker - chew tobacco, Marital Status - Widowed, Alcohol Use - Never, Drug Use - No History, Caffeine Use - Daily. Medical And Surgical History Notes Oncologic 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 Objective Constitutional Pulse regular. Respirations normal and unlabored. Afebrile. Vitals Time Taken: 10:24 AM, Height: 65 in, Weight: 161 lbs, BMI: 26.8, Temperature: 97.9 F, Pulse: 77 bpm, Respiratory Rate: 18 breaths/min, Blood Pressure: 139/65 mmHg. Eyes Nonicteric. Reactive to light. Ears, Nose, Mouth, and Throat [name], [name]. ([id number]) Lips, teeth, and gums WNL.[name] Moist mucosa without lesions. Neck supple and nontender. No palpable supraclavicular or cervical adenopathy. Normal sized without goiter. Respiratory WNL. No retractions.. Cardiovascular Pedal Pulses WNL. No clubbing, cyanosis or edema. Lymphatic No adneopathy. No adenopathy. No adenopathy. Musculoskeletal Adexa without tenderness or enlargement.. Digits and nails w/o clubbing, cyanosis, infection, petechiae, ischemia, or inflammatory conditions.[name] Psychiatric Judgement and insight Intact.. No evidence of depression, anxiety, or agitation.. General Notes: the right upper quadrant wound is completely healed and the left epigastric area has a couple of small areas open and no sharp debridement was required today. We will try PolyMem silver to be used locally and changed every 3-4 days Integumentary (Hair, Skin) No suspicious lesions. No crepitus or fluctuance. No peri-wound warmth or erythema. No masses.. Wound #1 status is Open. Original cause of wound was Gradually Appeared. The wound is located on the Right Abdomen - midline. The wound measures 0.1cm length x 0.1cm width x 0.1cm depth;  0.008cm^2 area and 0.001cm^3 volume. The wound is limited to skin breakdown. There is no tunneling or undermining noted. There is a medium amount of serosanguineous drainage noted. The wound margin is flat and intact. There is large (67-100%) pink granulation within  the wound bed. There is no necrotic tissue within the wound bed. The periwound skin appearance exhibited: Scarring. The periwound skin appearance did not exhibit: Callus, Crepitus, Excoriation, Induration, Rash, Dry/Scaly, Maceration, Atrophie Blanche, Cyanosis, Ecchymosis, Hemosiderin Staining, Mottled, Pallor, Rubor, Erythema. Periwound temperature was noted as No Abnormality. Wound #2 status is Open. Original cause of wound was Gradually Appeared. The wound is located on the Left Abdomen - midline. The wound measures 5cm length x 0.5cm width x 0.1cm depth; 1.963cm^2 area and 0.196cm^3 volume. The wound is limited to skin breakdown. There is no tunneling or undermining noted. There is a large amount of purulent drainage noted. The wound margin is flat and intact. There is medium (34-66%) red granulation within the wound bed. There is a medium (34- 66%) amount of necrotic tissue within the wound bed including Eschar and Adherent Slough. The periwound skin appearance exhibited: Induration, Scarring, Ecchymosis. The periwound skin appearance did not exhibit: Callus, Crepitus, Excoriation, Rash, Dry/Scaly, Maceration, Atrophie Blanche, Cyanosis, Hemosiderin Staining, Mottled, Pallor, Rubor, Erythema. Periwound temperature was noted as No Abnormality. Assessment Active Problems ICD-10 E11.622 - Type 2 diabetes mellitus with other skin ulcer S31.100A - Unspecified open wound of abdominal wall, right upper quadrant without penetration into peritoneal cavity, initial encounter S31.102A - Unspecified open wound of abdominal wall, epigastric region without penetration into peritoneal cavity, initial [name], [name].  ([id number]) encounter F17.228 - Nicotine dependence, chewing tobacco, with other nicotine-induced disorders Plan Wound Cleansing: Wound #1 Right Abdomen - midline: Clean wound with Normal Saline. May Shower, gently pat wound dry prior to applying new dressing. Wound #2 Left Abdomen - midline: Clean wound with Normal Saline. May Shower, gently pat wound dry prior to applying new dressing. Anesthetic: Wound #1 Right Abdomen - midline: Topical Lidocaine 4% cream applied to wound bed prior to debridement Wound #2 Left Abdomen - midline: Topical Lidocaine 4% cream applied to wound bed prior to debridement Skin Barriers/Peri-Wound Care: Wound #1 Right Abdomen - midline: Skin Prep Wound #2 Left Abdomen - midline: Skin Prep Primary Wound Dressing: Wound #1 Right Abdomen - midline: Other: - PolyMem Silver Wound #2 Left Abdomen - midline: Other: - PolyMem Silver Secondary Dressing: Wound #1 Right Abdomen - midline: Other - [place] Wound #2 Left Abdomen - midline: Other - [place] Dressing Change Frequency: Wound #1 Right Abdomen - midline: Change dressing every other day. Wound #2 Left Abdomen - midline: Change dressing every other day. Follow-up Appointments: Wound #1 Right Abdomen - midline: Other: - 3 weeks Wound #2 Left Abdomen - midline: Other: - 3 weeks The right upper quadrant wound is completely healed and the left epigastric area has a couple of small areas open and no sharp debridement was required today. We will try PolyMem silver to be used locally and changed every 3-4 days. He is encouraged to continue all supportive care and see as about once a month for palliative care. [name], [name] ([id number]) Electronic Signature(s) Signed: 01/19/2017 10:41:39 AM By: Christin Fudge MD, FACS Entered By: Christin Fudge on 01/19/2017 10:41:39 [name], [name] ([id number]) -------------------------------------------------------------------------------- ROS/PFSH  Details Patient Name: AVERI, CACIOPPO Date of Service: 01/19/2017 10:15 AM Medical Record Number: [id number] Patient Account Number: [id number] Date of Birth/Sex: 06-03-1945 (71 y.o. Male) Treating RN: Montey Hora Primary Care Provider: Thomes Lolling Other Clinician: Referring Provider: Thomes Lolling Treating Provider/Extender: Frann Rider in Treatment: 28 Information Obtained From Patient Wound History Do you currently have one or more open woundso Yes How many open wounds do you  currently haveo 2 Approximately how long have you had your woundso more than 1 year How have you been treating your wound(s) until nowo open to air Has your wound(s) ever healed and then re-openedo No Have you had any lab work done in the past montho No Have you tested positive for an antibiotic resistant organism (MRSA, VRE)o No Have you tested positive for osteomyelitis (bone infection)o No Have you had any tests for circulation on your legso No Eyes Medical History: Positive for: Cataracts - removed Hematologic/Lymphatic Medical History: Negative for: Anemia; Hemophilia; Human Immunodeficiency Virus; Lymphedema; Sickle Cell Disease Respiratory Medical History: Positive for: Chronic Obstructive Pulmonary Disease (COPD) Negative for: Aspiration; Asthma; Pneumothorax; Sleep Apnea; Tuberculosis Cardiovascular Medical History: Positive for: Hypertension Negative for: Angina; Arrhythmia; Congestive Heart Failure; Coronary Artery Disease; Deep Vein Thrombosis; Hypotension; Myocardial Infarction; Peripheral Arterial Disease; Peripheral Venous Disease; Phlebitis; Vasculitis Gastrointestinal Medical History: Negative for: Cirrhosis ; Colitis; Crohnos; Hepatitis A; Hepatitis B; Hepatitis C Endocrine Medical History: Positive for: Type II Diabetes Treated with: Oral agents Blood sugar tested every day: No [name], [name] ([id number]) Genitourinary Medical History: Negative for: End Stage Renal  Disease Immunological Medical History: Negative for: Lupus Erythematosus; Raynaudos; Scleroderma Integumentary (Skin) Medical History: Negative for: History of Burn; History of pressure wounds Musculoskeletal Medical History: Positive for: Osteoarthritis Negative for: Gout; Rheumatoid Arthritis; Osteomyelitis Neurologic Medical History: Positive for: Neuropathy Negative for: Dementia; Quadriplegia; Paraplegia; Seizure Disorder Oncologic Medical History: Negative for: Received Chemotherapy; Received Radiation Past Medical History Notes: 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 Psychiatric Medical History: Negative for: Anorexia/bulimia; Confinement Anxiety HBO Extended History Items Eyes: Cataracts Immunizations Pneumococcal Vaccine: Received Pneumococcal Vaccination: Yes Immunization Notes: up to date Implantable Devices Family and Social History Former smoker - chew tobacco; Marital Status - Widowed; Alcohol Use: Never; Drug Use: No History; Caffeine Use: Daily; Financial Concerns: No; Food, Clothing or Shelter Needs: No; Support System Lacking: No; Transportation Concerns: No; Advanced Directives: No; Patient does not want information on Advanced Directives Physician Affirmation I have reviewed and agree with the above information. [name], [name] ([id number]) Electronic Signature(s) Signed: 01/19/2017 3:39:10 PM By: Christin Fudge MD, FACS Signed: 01/19/2017 4:50:52 PM By: Montey Hora Entered By: Christin Fudge on 01/19/2017 10:40:14 DJIMON, LUNDSTROM ([id number]) -------------------------------------------------------------------------------- [facility] Details Patient Name: DAMETRI, OZBURN Date of Service: 01/19/2017 Medical Record Number: [id number] Patient Account Number: [id number] Date of Birth/Sex: 03-08-1946 (71 y.o. Male) Treating RN: Montey Hora Primary Care Provider: Thomes Lolling Other Clinician: Referring Provider: Thomes Lolling Treating  Provider/Extender: Frann Rider in Treatment: 28 Diagnosis Coding ICD-10 Codes Code Description E11.622 Type 2 diabetes mellitus with other skin ulcer Unspecified open wound of abdominal wall, right upper quadrant without penetration into peritoneal S31.100A cavity, initial encounter Unspecified open wound of abdominal wall, epigastric region without penetration into peritoneal cavity, S31.102A initial encounter F17.228 Nicotine dependence, chewing tobacco, with other nicotine-induced disorders Facility Procedures CPT4 Code: [id number] Description: 99213 - WOUND CARE VISIT-LEV 3 EST PT Modifier: Quantity: 1 Physician Procedures CPT4: Description Modifier Quantity Code [id number] 17616 - WC PHYS LEVEL 3 - EST PT 1 ICD-10 Diagnosis Description E11.622 Type 2 diabetes mellitus with other skin ulcer S31.100A Unspecified open wound of abdominal wall, right upper quadrant without  penetration into peritoneal cavity, initial encounter S31.102A Unspecified open wound of abdominal wall, epigastric region without penetration into peritoneal cavity, initial encounter Electronic Signature(s) Signed: 01/19/2017 11:02:42 AM By: Montey Hora Signed: 01/19/2017 3:39:10 PM By: Christin Fudge MD, FACS Previous Signature: 01/19/2017 10:41:52 AM  Version By: Christin Fudge MD, FACS Entered By: Montey Hora on 01/19/2017 11:02:42

## 2017-01-22 MED ORDER — OXYCODONE-ACETAMINOPHEN 5 MG-325 MG TABLET
ORAL_TABLET | ORAL | 0 refills | 0.00000 days | Status: CP | PRN
Start: 2017-01-22 — End: 2017-02-21

## 2017-02-09 ENCOUNTER — Encounter: Payer: Medicare HMO | Admitting: Surgery

## 2017-02-09 DIAGNOSIS — E11622 Type 2 diabetes mellitus with other skin ulcer: Secondary | ICD-10-CM | POA: Diagnosis not present

## 2017-02-11 NOTE — Progress Notes (Signed)
NYAIR, DEPAULO ([id number]) Visit Report for 02/09/2017 Chief Complaint Document Details Patient Name: [name], [name]. Date of Service: 02/09/2017 10:15 AM Medical Record Number: [id number] Patient Account Number: [id number] Date of Birth/Sex: November 29, 1945 (71 y.o. Male) Treating RN: Montey Hora Primary Care Provider: Thomes Lolling Other Clinician: Referring Provider: Thomes Lolling Treating Provider/Extender: Frann Rider in Treatment: 31 Information Obtained from: Patient Chief Complaint Patients presents for treatment of an open diabetic ulcer to the anterior abdominal wall in the epigastric and right upper quadrant Electronic Signature(s) Signed: 02/09/2017 10:36:12 AM By: Christin Fudge MD, FACS Entered By: Christin Fudge on 02/09/2017 10:36:12 [name] ([id number]) -------------------------------------------------------------------------------- HPI Details Patient Name: [name] Date of Service: 02/09/2017 10:15 AM Medical Record Number: [id number] Patient Account Number: [id number] Date of Birth/Sex: [birth year] (72 y.o. Male) Treating RN: Montey Hora Primary Care Provider: Thomes Lolling Other Clinician: Referring Provider: Thomes Lolling Treating Provider/Extender: Frann Rider in Treatment: 31 History of Present Illness Location: abdomen wall wounds,-- right upper quadrant and epigastric region Quality: Patient reports experiencing a dull pain to affected area(s). Severity: Patient states wound are getting worse. Duration: Patient has had the wound for >5 year's prior to seeking treatment at the wound center Timing: Pain in wound is Intermittent (comes and goes Context: The wound appeared gradually over time Modifying Factors: Consults to this date include:not seen a surgeon and only sees his PCP for his medical complaints Associated Signs and Symptoms: Patient reports having increase discharge. HPI Description: 71 year old gentleman has been  seen recently by his PCP Dr. Thomes Lolling, who sees him with a history of diabetes mellitus, peripheral neuropathy, B12 deficiency, hypertension, osteoarthritis and the patient had recent blood sugar checked which was 163. Past medical history includes essential hypertension, incisional hernia, diabetes mellitus, colon polyps, COPD, tobacco abuse in the past, chewing tobacco, GERD. He has also been treated for chronic back pain with no sciatica and is on oxycodone. His last hemoglobin A1c was 6.9% His abdominal surgery was done at [facility] over 15 years ago and gradually there was sutures which protruded and then there was mesh which protruded. He washes this during his shower but does not put any dressing and the wound is covered with a lot of debris. He has never had a surgical consultation for this. 07/10/2016 -- the patient has spoken to his PCP who said he was going to call me but I have not yet had a phone conversation with the physician. From what I understand the physician was reluctant to have a surgical opinion because of the patient's several comorbidities and he feels that the surgery may be detrimental to his overall health 07/24/2016 -- the patient's daughter is at the bedside and we've had a detailed discussion regarding his options for a surgical opinion and possible surgical intervention before this becomes a emergent situation. She will seek primary care input and take the father to [facility] for a surgical opinion. 07/31/2016 -- the patient's daughter has organized for a surgical opinion at [facility] on June 14 08/14/2016 -- it has been about 3 weeks since he has stopped chewing tobacco. 08/28/2016 -- he was seen at [facility] by Dr. Roby Lofts -- the assessment was based on physical examination and review of his previous history of surgical repair. the patient had undergone a ventral hernia repair in 1998 with Dr. Quay Burow at [facility] at a prior open  cholecystectomy incision. he was evaluated by Dr. Denice Paradise in 2013 and at that  time it was decided to watch and wait. Dr. Payton Doughty recommended a repeat CT scan and requested records from dura and regional operative records and would follow-up the patient, after Mr. [place] PCP Dr. Gwenlyn Saran reviewed him for overall fitness for surgery. 09/04/16 patient's wound actually appear to be doing well on evaluation today. He is not really experience any discomfort and the right abdominal wound is dry and almost appears to be healing up. He continues to have some drainage from the left but this seems to be more of a potential seroma which with milking is able to be cleared out. I discussed with patient that when he performs his dressing changes at home it would be beneficial for him to do this as well. Otherwise there's no evidence of infection. He is still waiting on his CT scan and appointments to determine whether he is going to have another surgery. 09/11/2016 -- the patient has had a CT scan and is awaiting his surgical appointment prior to deciding on any surgical intervention. 09/25/16 patient presents today for fault evaluation concerning his ongoing abdominal surgical wounds. Unfortunately they do not appear to be improving significantly but rather fluctuate from a little better to little worse week by week. Obviously this is somewhat frustrating for him. [name], [name] ([id number]) 10/02/2016 -- the patient has completed his CT scan, and has a review by the surgeons at the end of August. He has a new area which has opened up a little below his left abdominal wound. 10/16/16 on evaluation today patient's wounds on the abdominal region appears to be doing about the same. He actually has an appointment with his surgeon on the 21st for evaluation regarding the required products appointment. Obviously he is having a difficult time with the feeling of these wounds. Fortunately there does not appear to be any  evidence of local infection such as significant amount of erythema or streaking. She also has an audio, vomiting, or diarrhea. He is not have any discomfort at this point. 10/30/2016 -- the surgical review is tomorrow and I am awaiting the input of the surgeon. 11/16/2016 -- I had a call from his surgeon Dr. Payton Doughty, on phone number [phone] who was kind enough to call me regarding his care. After a thorough review and having consulted the abdominal wall reconstruction team they have decided that surgery will be too extensive and rather moribund for this gentleman and have asked him to continue with local care at the wound center. She will send me an official note via Epic. Electronic Signature(s) Signed: 02/09/2017 10:36:16 AM By: Christin Fudge MD, FACS Entered By: Christin Fudge on 02/09/2017 10:36:16 [name] ([id number]) -------------------------------------------------------------------------------- Physical Exam Details Patient Name: [name], [name] Date of Service: 02/09/2017 10:15 AM Medical Record Number: [id number] Patient Account Number: [id number] Date of Birth/Sex: Jun 09, 1945 (71 y.o. Male) Treating RN: Montey Hora Primary Care Provider: Thomes Lolling Other Clinician: Referring Provider: Thomes Lolling Treating Provider/Extender: Frann Rider in Treatment: 31 Constitutional . Pulse regular. Respirations normal and unlabored. Afebrile. . Eyes Nonicteric. Reactive to light. Ears, Nose, Mouth, and Throat Lips, teeth, and gums WNL.[name] Moist mucosa without lesions. Neck supple and nontender. No palpable supraclavicular or cervical adenopathy. Normal sized without goiter. Respiratory WNL. No retractions.. Cardiovascular Pedal Pulses WNL. No clubbing, cyanosis or edema. Lymphatic No adneopathy. No adenopathy. No adenopathy. Musculoskeletal Adexa without tenderness or enlargement.. Digits and nails w/o clubbing, cyanosis, infection, petechiae, ischemia,  or inflammatory conditions.. Integumentary (Hair, Skin) No suspicious lesions. No crepitus or  fluctuance. No peri-wound warmth or erythema. No masses.[name] Psychiatric Judgement and insight Intact.. No evidence of depression, anxiety, or agitation.. Notes the epigastric wound has a few small areas which are superficial sinus tracts but there is no mesh to trim today and overall the wound is looking very good. The right upper quadrant wound stays healed. Electronic Signature(s) Signed: 02/09/2017 10:36:59 AM By: Christin Fudge MD, FACS Entered By: Christin Fudge on 02/09/2017 10:36:58 [name] ([id number]) -------------------------------------------------------------------------------- Physician Orders Details Patient Name: [name], [name] Date of Service: 02/09/2017 10:15 AM Medical Record Number: [id number] Patient Account Number: [id number] Date of Birth/Sex: 1945/04/04 (71 y.o. Male) Treating RN: Montey Hora Primary Care Provider: Thomes Lolling Other Clinician: Referring Provider: Thomes Lolling Treating Provider/Extender: Frann Rider in Treatment: 70 Verbal / Phone Orders: No Diagnosis Coding Wound Cleansing Wound #1 Right Abdomen - midline o Clean wound with Normal Saline. o May Shower, gently pat wound dry prior to applying new dressing. Wound #2 Left Abdomen - midline o Clean wound with Normal Saline. o May Shower, gently pat wound dry prior to applying new dressing. Anesthetic Wound #1 Right Abdomen - midline o Topical Lidocaine 4% cream applied to wound bed prior to debridement Wound #2 Left Abdomen - midline o Topical Lidocaine 4% cream applied to wound bed prior to debridement Skin Barriers/Peri-Wound Care Wound #1 Right Abdomen - midline o Skin Prep Wound #2 Left Abdomen - midline o Skin Prep Primary Wound Dressing Wound #1 Right Abdomen - midline o Other: - PolyMem Silver Wound #2 Left Abdomen - midline o Other: - PolyMem  Silver Secondary Dressing Wound #1 Right Abdomen - midline o Other - [place] Wound #2 Left Abdomen - midline o Other - [place] Dressing Change Frequency Wound #1 Right Abdomen - midline o Change dressing every other day. Wound #2 Left Abdomen - midline o Change dressing every other day. ATA, PECHA ([id number]) Follow-up Appointments Wound #1 Right Abdomen - midline o Return Appointment in 1 month Wound #2 Left Abdomen - midline o Return Appointment in 1 month Electronic Signature(s) Signed: 02/09/2017 4:42:19 PM By: Christin Fudge MD, FACS Signed: 02/09/2017 4:46:33 PM By: Montey Hora Entered By: Montey Hora on 02/09/2017 10:32:08 [name] ([id number]) -------------------------------------------------------------------------------- Problem List Details Patient Name: [name], [name]. Date of Service: 02/09/2017 10:15 AM Medical Record Number: [id number] Patient Account Number: [id number] Date of Birth/Sex: 14-May-1945 (71 y.o. Male) Treating RN: Montey Hora Primary Care Provider: Thomes Lolling Other Clinician: Referring Provider: Thomes Lolling Treating Provider/Extender: Frann Rider in Treatment: 31 Active Problems ICD-10 Encounter Code Description Active Date Diagnosis E11.622 Type 2 diabetes mellitus with other skin ulcer 07/03/2016 Yes S31.100A Unspecified open wound of abdominal wall, right upper quadrant 07/03/2016 Yes without penetration into peritoneal cavity, initial encounter S31.102A Unspecified open wound of abdominal wall, epigastric region 07/03/2016 Yes without penetration into peritoneal cavity, initial encounter F17.228 Nicotine dependence, chewing tobacco, with other nicotine-induced 07/03/2016 Yes disorders Inactive Problems Resolved Problems Electronic Signature(s) Signed: 02/09/2017 10:36:01 AM By: Christin Fudge MD, FACS Entered By: Christin Fudge on 02/09/2017 10:36:00 [name]  ([id number]) -------------------------------------------------------------------------------- Progress Note Details Patient Name: [name] Date of Service: 02/09/2017 10:15 AM Medical Record Number: [id number] Patient Account Number: [id number] Date of Birth/Sex: August 31, 1945 (71 y.o. Male) Treating RN: Montey Hora Primary Care Provider: Thomes Lolling Other Clinician: Referring Provider: Thomes Lolling Treating Provider/Extender: Frann Rider in Treatment: 31 Subjective Chief Complaint Information obtained from Patient Patients presents for treatment of an open diabetic ulcer  to the anterior abdominal wall in the epigastric and right upper quadrant History of Present Illness (HPI) The following HPI elements were documented for the patient's wound: Location: abdomen wall wounds,-- right upper quadrant and epigastric region Quality: Patient reports experiencing a dull pain to affected area(s). Severity: Patient states wound are getting worse. Duration: Patient has had the wound for >5 year's prior to seeking treatment at the wound center Timing: Pain in wound is Intermittent (comes and goes Context: The wound appeared gradually over time Modifying Factors: Consults to this date include:not seen a surgeon and only sees his PCP for his medical complaints Associated Signs and Symptoms: Patient reports having increase discharge. 71 year old gentleman has been seen recently by his PCP Dr. Thomes Lolling, who sees him with a history of diabetes mellitus, peripheral neuropathy, B12 deficiency, hypertension, osteoarthritis and the patient had recent blood sugar checked which was 163. Past medical history includes essential hypertension, incisional hernia, diabetes mellitus, colon polyps, COPD, tobacco abuse in the past, chewing tobacco, GERD. He has also been treated for chronic back pain with no sciatica and is on oxycodone. His last hemoglobin A1c was 6.9% His abdominal surgery was  done at [facility] over 15 years ago and gradually there was sutures which protruded and then there was mesh which protruded. He washes this during his shower but does not put any dressing and the wound is covered with a lot of debris. He has never had a surgical consultation for this. 07/10/2016 -- the patient has spoken to his PCP who said he was going to call me but I have not yet had a phone conversation with the physician. From what I understand the physician was reluctant to have a surgical opinion because of the patient's several comorbidities and he feels that the surgery may be detrimental to his overall health 07/24/2016 -- the patient's daughter is at the bedside and we've had a detailed discussion regarding his options for a surgical opinion and possible surgical intervention before this becomes a emergent situation. She will seek primary care input and take the father to [facility] for a surgical opinion. 07/31/2016 -- the patient's daughter has organized for a surgical opinion at [facility] on June 14 08/14/2016 -- it has been about 3 weeks since he has stopped chewing tobacco. 08/28/2016 -- he was seen at [facility] by Dr. Roby Lofts -- the assessment was based on physical examination and review of his previous history of surgical repair. the patient had undergone a ventral hernia repair in 1998 with Dr. Quay Burow at [facility] at a prior open cholecystectomy incision. he was evaluated by Dr. Denice Paradise in 2013 and at that time it was decided to watch and wait. Dr. Payton Doughty recommended a repeat CT scan and requested records from dura and regional operative records and would follow-up the patient, after Mr. [place] PCP Dr. Gwenlyn Saran reviewed him for overall fitness for surgery. 09/04/16 patient's wound actually appear to be doing well on evaluation today. He is not really experience any discomfort and the right abdominal wound is dry and almost appears to be healing up. He continues  to have some drainage from the left but this seems to be more of a potential seroma which with milking is able to be cleared out. I discussed with patient that when he performs his dressing changes at home it would be beneficial for him to do this as well. Otherwise there's no evidence of [name], [name]. ([id number]) infection. He is still waiting  on his CT scan and appointments to determine whether he is going to have another surgery. 09/11/2016 -- the patient has had a CT scan and is awaiting his surgical appointment prior to deciding on any surgical intervention. 09/25/16 patient presents today for fault evaluation concerning his ongoing abdominal surgical wounds. Unfortunately they do not appear to be improving significantly but rather fluctuate from a little better to little worse week by week. Obviously this is somewhat frustrating for him. 10/02/2016 -- the patient has completed his CT scan, and has a review by the surgeons at the end of August. He has a new area which has opened up a little below his left abdominal wound. 10/16/16 on evaluation today patient's wounds on the abdominal region appears to be doing about the same. He actually has an appointment with his surgeon on the 21st for evaluation regarding the required products appointment. Obviously he is having a difficult time with the feeling of these wounds. Fortunately there does not appear to be any evidence of local infection such as significant amount of erythema or streaking. She also has an audio, vomiting, or diarrhea. He is not have any discomfort at this point. 10/30/2016 -- the surgical review is tomorrow and I am awaiting the input of the surgeon. 11/16/2016 -- I had a call from his surgeon Dr. Payton Doughty, on phone number [phone] who was kind enough to call me regarding his care. After a thorough review and having consulted the abdominal wall reconstruction team they have decided that surgery will be too extensive and  rather moribund for this gentleman and have asked him to continue with local care at the wound center. She will send me an official note via Epic. Patient History Information obtained from Patient. Social History Former smoker - chew tobacco, Marital Status - Widowed, Alcohol Use - Never, Drug Use - No History, Caffeine Use - Daily. Medical And Surgical History Notes Oncologic 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 Objective Constitutional Pulse regular. Respirations normal and unlabored. Afebrile. Vitals Time Taken: 10:15 AM, Height: 65 in, Weight: 161 lbs, BMI: 26.8, Temperature: 98.1 F, Pulse: 81 bpm, Respiratory Rate: 18 breaths/min, Blood Pressure: 151/68 mmHg. Eyes Nonicteric. Reactive to light. Ears, Nose, Mouth, and Throat [name], [name]. ([id number]) Lips, teeth, and gums WNL.[name] Moist mucosa without lesions. Neck supple and nontender. No palpable supraclavicular or cervical adenopathy. Normal sized without goiter. Respiratory WNL. No retractions.. Cardiovascular Pedal Pulses WNL. No clubbing, cyanosis or edema. Lymphatic No adneopathy. No adenopathy. No adenopathy. Musculoskeletal Adexa without tenderness or enlargement.. Digits and nails w/o clubbing, cyanosis, infection, petechiae, ischemia, or inflammatory conditions.[name] Psychiatric Judgement and insight Intact.. No evidence of depression, anxiety, or agitation.. General Notes: the epigastric wound has a few small areas which are superficial sinus tracts but there is no mesh to trim today and overall the wound is looking very good. The right upper quadrant wound stays healed. Integumentary (Hair, Skin) No suspicious lesions. No crepitus or fluctuance. No peri-wound warmth or erythema. No masses.. Wound #1 status is Open. Original cause of wound was Gradually Appeared. The wound is located on the Right Abdomen - midline. The wound measures 0.1cm length x 0.1cm width x 0.1cm depth; 0.008cm^2 area and 0.001cm^3  volume. The wound is limited to skin breakdown. There is no tunneling or undermining noted. There is a medium amount of serosanguineous drainage noted. The wound margin is flat and intact. There is large (67-100%) pink granulation within the wound bed. There is no necrotic tissue  within the wound bed. The periwound skin appearance exhibited: Scarring. The periwound skin appearance did not exhibit: Callus, Crepitus, Excoriation, Induration, Rash, Dry/Scaly, Maceration, Atrophie Blanche, Cyanosis, Ecchymosis, Hemosiderin Staining, Mottled, Pallor, Rubor, Erythema. Periwound temperature was noted as No Abnormality. Wound #2 status is Open. Original cause of wound was Gradually Appeared. The wound is located on the Left Abdomen - midline. The wound measures 5cm length x 0.5cm width x 0.1cm depth; 1.963cm^2 area and 0.196cm^3 volume. The wound is limited to skin breakdown. There is no tunneling or undermining noted. There is a large amount of purulent drainage noted. The wound margin is flat and intact. There is medium (34-66%) red granulation within the wound bed. There is a medium (34- 66%) amount of necrotic tissue within the wound bed including Eschar and Adherent Slough. The periwound skin appearance exhibited: Induration, Scarring, Ecchymosis. The periwound skin appearance did not exhibit: Callus, Crepitus, Excoriation, Rash, Dry/Scaly, Maceration, Atrophie Blanche, Cyanosis, Hemosiderin Staining, Mottled, Pallor, Rubor, Erythema. Periwound temperature was noted as No Abnormality. Assessment Active Problems ICD-10 E11.622 - Type 2 diabetes mellitus with other skin ulcer S31.100A - Unspecified open wound of abdominal wall, right upper quadrant without penetration into peritoneal cavity, initial encounter S31.102A - Unspecified open wound of abdominal wall, epigastric region without penetration into peritoneal cavity, initial encounter [name], [name] ([id number]) F17.228 - Nicotine dependence,  chewing tobacco, with other nicotine-induced disorders Plan Wound Cleansing: Wound #1 Right Abdomen - midline: Clean wound with Normal Saline. May Shower, gently pat wound dry prior to applying new dressing. Wound #2 Left Abdomen - midline: Clean wound with Normal Saline. May Shower, gently pat wound dry prior to applying new dressing. Anesthetic: Wound #1 Right Abdomen - midline: Topical Lidocaine 4% cream applied to wound bed prior to debridement Wound #2 Left Abdomen - midline: Topical Lidocaine 4% cream applied to wound bed prior to debridement Skin Barriers/Peri-Wound Care: Wound #1 Right Abdomen - midline: Skin Prep Wound #2 Left Abdomen - midline: Skin Prep Primary Wound Dressing: Wound #1 Right Abdomen - midline: Other: - PolyMem Silver Wound #2 Left Abdomen - midline: Other: - PolyMem Silver Secondary Dressing: Wound #1 Right Abdomen - midline: Other - [place] Wound #2 Left Abdomen - midline: Other - [place] Dressing Change Frequency: Wound #1 Right Abdomen - midline: Change dressing every other day. Wound #2 Left Abdomen - midline: Change dressing every other day. Follow-up Appointments: Wound #1 Right Abdomen - midline: Return Appointment in 1 month Wound #2 Left Abdomen - midline: Return Appointment in 1 month continues to do well with palliative care for superficial sinus tracts arising from protruding mesh and after review today I have asked him to continue with PolyMem silver dressing every 4 days or so and to see as back for review in about a month's time Electronic Signature(s) [name], [name] ([id number]) Signed: 02/09/2017 10:37:51 AM By: Christin Fudge MD, FACS Entered By: Christin Fudge on 02/09/2017 10:37:51 [name] ([id number]) -------------------------------------------------------------------------------- ROS/PFSH Details Patient Name: [name] Date of Service: 02/09/2017 10:15 AM Medical Record Number: [id number] Patient  Account Number: [id number] Date of Birth/Sex: 1945/07/20 (71 y.o. Male) Treating RN: Montey Hora Primary Care Provider: Thomes Lolling Other Clinician: Referring Provider: Thomes Lolling Treating Provider/Extender: Frann Rider in Treatment: 31 Information Obtained From Patient Wound History Do you currently have one or more open woundso Yes How many open wounds do you currently haveo 2 Approximately how long have you had your woundso more than 1 year How have you been  treating your wound(s) until nowo open to air Has your wound(s) ever healed and then re-openedo No Have you had any lab work done in the past montho No Have you tested positive for an antibiotic resistant organism (MRSA, VRE)o No Have you tested positive for osteomyelitis (bone infection)o No Have you had any tests for circulation on your legso No Eyes Medical History: Positive for: Cataracts - removed Hematologic/Lymphatic Medical History: Negative for: Anemia; Hemophilia; Human Immunodeficiency Virus; Lymphedema; Sickle Cell Disease Respiratory Medical History: Positive for: Chronic Obstructive Pulmonary Disease (COPD) Negative for: Aspiration; Asthma; Pneumothorax; Sleep Apnea; Tuberculosis Cardiovascular Medical History: Positive for: Hypertension Negative for: Angina; Arrhythmia; Congestive Heart Failure; Coronary Artery Disease; Deep Vein Thrombosis; Hypotension; Myocardial Infarction; Peripheral Arterial Disease; Peripheral Venous Disease; Phlebitis; Vasculitis Gastrointestinal Medical History: Negative for: Cirrhosis ; Colitis; Crohnos; Hepatitis A; Hepatitis B; Hepatitis C Endocrine Medical History: Positive for: Type II Diabetes Treated with: Oral agents Blood sugar tested every day: No [name], [name] ([id number]) Genitourinary Medical History: Negative for: End Stage Renal Disease Immunological Medical History: Negative for: Lupus Erythematosus; Raynaudos; Scleroderma Integumentary  (Skin) Medical History: Negative for: History of Burn; History of pressure wounds Musculoskeletal Medical History: Positive for: Osteoarthritis Negative for: Gout; Rheumatoid Arthritis; Osteomyelitis Neurologic Medical History: Positive for: Neuropathy Negative for: Dementia; Quadriplegia; Paraplegia; Seizure Disorder Oncologic Medical History: Negative for: Received Chemotherapy; Received Radiation Past Medical History Notes: 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 Psychiatric Medical History: Negative for: Anorexia/bulimia; Confinement Anxiety HBO Extended History Items Eyes: Cataracts Immunizations Pneumococcal Vaccine: Received Pneumococcal Vaccination: Yes Immunization Notes: up to date Implantable Devices Family and Social History Former smoker - chew tobacco; Marital Status - Widowed; Alcohol Use: Never; Drug Use: No History; Caffeine Use: Daily; Financial Concerns: No; Food, Clothing or Shelter Needs: No; Support System Lacking: No; Transportation Concerns: No; Advanced Directives: No; Patient does not want information on Advanced Directives Physician Affirmation I have reviewed and agree with the above information. [name], [name] ([id number]) Electronic Signature(s) Signed: 02/09/2017 4:42:19 PM By: Christin Fudge MD, FACS Signed: 02/09/2017 4:46:33 PM By: Montey Hora Entered By: Christin Fudge on 02/09/2017 10:36:23 [name], DEBES ([id number]) -------------------------------------------------------------------------------- SuperBill Details Patient Name: SHAKEEM, STERN Date of Service: 02/09/2017 Medical Record Number: [id number] Patient Account Number: [id number] Date of Birth/Sex: Sep 10, 1945 (71 y.o. Male) Treating RN: Montey Hora Primary Care Provider: Thomes Lolling Other Clinician: Referring Provider: Thomes Lolling Treating Provider/Extender: Frann Rider in Treatment: 31 Diagnosis Coding ICD-10 Codes Code Description E11.622 Type  2 diabetes mellitus with other skin ulcer Unspecified open wound of abdominal wall, right upper quadrant without penetration into peritoneal S31.100A cavity, initial encounter Unspecified open wound of abdominal wall, epigastric region without penetration into peritoneal cavity, S31.102A initial encounter F17.228 Nicotine dependence, chewing tobacco, with other nicotine-induced disorders Facility Procedures CPT4 Code: 55732202 Description: 99213 - WOUND CARE VISIT-LEV 3 EST PT Modifier: Quantity: 1 Physician Procedures CPT4: Description Modifier Quantity Code [id number] 99213 - WC PHYS LEVEL 3 - EST PT 1 ICD-10 Diagnosis Description E11.622 Type 2 diabetes mellitus with other skin ulcer S31.100A Unspecified open wound of abdominal wall, right upper quadrant without  penetration into peritoneal cavity, initial encounter S31.102A Unspecified open wound of abdominal wall, epigastric region without penetration into peritoneal cavity, initial encounter F17.228 Nicotine dependence, chewing tobacco, with other  nicotine-induced disorders Electronic Signature(s) Signed: 02/09/2017 11:58:49 AM By: Montey Hora Signed: 02/09/2017 4:42:19 PM By: Christin Fudge MD, FACS Previous Signature: 02/09/2017 11:58:41 AM Version By: Montey Hora Previous Signature: 02/09/2017 10:38:05 AM  Version By: Christin Fudge MD, FACS Entered By: Montey Hora on 02/09/2017 11:58:49

## 2017-02-11 NOTE — Progress Notes (Signed)
[name] ([id number]) Visit Report for 02/09/2017 Arrival Information Details Patient Name: [name], [name]. Date of Service: 02/09/2017 10:15 AM Medical Record Number: [id number] Patient Account Number: [id number] Date of Birth/Sex: [birth year] (71 y.o. Male) Treating RN: [name] Primary Care [name]: [name] Other Clinician: Referring [name]: [name] [name]: [name] in Treatment: 59 Visit Information History Since Last Visit Added or deleted any medications: No Patient Arrived: Ambulatory Any new allergies or adverse reactions: No Arrival Time: 10:14 Had a fall or experienced change in No Accompanied By: self activities of daily living that may affect Transfer Assistance: None risk of falls: Patient Identification Verified: Yes Signs or symptoms of abuse/neglect since last visito No Secondary Verification Process Completed: Yes Hospitalized since last visit: No Patient Requires Transmission-Based No Has Dressing in Place as Prescribed: Yes Precautions: Pain Present Now: No Patient Has Alerts: Yes Patient Alerts: DMII Electronic Signature(s) Signed: 02/09/2017 4:46:33 PM By: [name] Entered By: [name] on 02/09/2017 10:15:00 [name] ([id number]) -------------------------------------------------------------------------------- Clinic Level of Care Assessment Details Patient Name: [name] Date of Service: 02/09/2017 10:15 AM Medical Record Number: [id number] Patient Account Number: [id number] Date of Birth/Sex: Apr 18, 1945 (71 y.o. Male) Treating RN: [name] Primary Care [name]: [name] Other Clinician: Referring [name]: [name] [name]: [name] in Treatment: 31 Clinic Level of Care Assessment Items TOOL 4 Quantity Score []  - Use when only an EandM is performed on FOLLOW-UP visit 0 ASSESSMENTS - Nursing Assessment / Reassessment X -  Reassessment of Co-morbidities (includes updates in patient status) 1 10 X- 1 5 Reassessment of Adherence to Treatment Plan ASSESSMENTS - Wound and Skin Assessment / Reassessment []  - Simple Wound Assessment / Reassessment - one wound 0 X- 2 5 Complex Wound Assessment / Reassessment - multiple wounds []  - 0 Dermatologic / Skin Assessment (not related to wound area) ASSESSMENTS - Focused Assessment []  - Circumferential Edema Measurements - multi extremities 0 []  - 0 Nutritional Assessment / Counseling / Intervention []  - 0 Lower Extremity Assessment (monofilament, tuning fork, pulses) []  - 0 Peripheral Arterial Disease Assessment (using hand held doppler) ASSESSMENTS - Ostomy and/or Continence Assessment and Care []  - Incontinence Assessment and Management 0 []  - 0 Ostomy Care Assessment and Management (repouching, etc.) PROCESS - Coordination of Care X - Simple Patient / Family Education for ongoing care 1 15 []  - 0 Complex (extensive) Patient / Family Education for ongoing care []  - 0 Staff obtains Programmer, systems, Records, Test Results / Process Orders []  - 0 Staff telephones HHA, Nursing Homes / Clarify orders / etc []  - 0 Routine Transfer to another Facility (non-emergent condition) []  - 0 Routine Hospital Admission (non-emergent condition) []  - 0 New Admissions / Biomedical engineer / Ordering NPWT, Apligraf, etc. []  - 0 Emergency Hospital Admission (emergent condition) X- 1 10 Simple Discharge Coordination [name] ([id number]) []  - 0 Complex (extensive) Discharge Coordination PROCESS - Special Needs []  - Pediatric / Minor Patient Management 0 []  - 0 Isolation Patient Management []  - 0 Hearing / Language / Visual special needs []  - 0 Assessment of Community assistance (transportation, D/C planning, etc.) []  - 0 Additional assistance / Altered mentation []  - 0 Support Surface(s) Assessment (bed, cushion, seat, etc.) INTERVENTIONS - Wound Cleansing /  Measurement []  - Simple Wound Cleansing - one wound 0 X- 2 5 Complex Wound Cleansing - multiple wounds X- 1 5 Wound Imaging (photographs - any number of wounds) []  - 0 Wound Tracing (  instead of photographs) []  - 0 Simple Wound Measurement - one wound X- 2 5 Complex Wound Measurement - multiple wounds INTERVENTIONS - Wound Dressings X - Small Wound Dressing one or multiple wounds 1 10 []  - 0 Medium Wound Dressing one or multiple wounds []  - 0 Large Wound Dressing one or multiple wounds []  - 0 Application of Medications - topical []  - 0 Application of Medications - injection INTERVENTIONS - Miscellaneous []  - External ear exam 0 []  - 0 Specimen Collection (cultures, biopsies, blood, body fluids, etc.) []  - 0 Specimen(s) / Culture(s) sent or taken to Lab for analysis []  - 0 Patient Transfer (multiple staff / Civil Service fast streamer / Similar devices) []  - 0 Simple Staple / Suture removal (25 or less) []  - 0 Complex Staple / Suture removal (26 or more) []  - 0 Hypo / Hyperglycemic Management (close monitor of Blood Glucose) []  - 0 Ankle / Brachial Index (ABI) - do not check if billed separately X- 1 5 Vital Signs [name], [name]. ([id number]) Has the patient been seen at the hospital within the last three years: Yes Total Score: 90 Level Of Care: New/Established - Level 3 Electronic Signature(s) Signed: 02/09/2017 4:46:33 PM By: [name] Entered By: [name] on 02/09/2017 11:58:31 [name] ([id number]) -------------------------------------------------------------------------------- Encounter Discharge Information Details Patient Name: [name] Date of Service: 02/09/2017 10:15 AM Medical Record Number: [id number] Patient Account Number: [id number] Date of Birth/Sex: 09-17-1945 (71 y.o. Male) Treating RN: [name] Primary Care Baila Rouse: [name] Other Clinician: Referring [name]: [name] [name]: [name] in  Treatment: 53 Encounter Discharge Information Items Discharge Pain Level: 0 Discharge Condition: Stable Ambulatory Status: Ambulatory Discharge Destination: Home Private Transportation: Auto Accompanied By: self Schedule Follow-up Appointment: Yes Medication Reconciliation completed and provided No to Patient/Care Lavetta Geier: Clinical Summary of Care: Electronic Signature(s) Signed: 02/09/2017 11:59:33 AM By: [name] Entered By: [name] on 02/09/2017 11:59:33 [name] ([id number]) -------------------------------------------------------------------------------- Multi Wound Chart Details Patient Name: [name] Date of Service: 02/09/2017 10:15 AM Medical Record Number: [id number] Patient Account Number: [id number] Date of Birth/Sex: May 22, 1945 (71 y.o. Male) Treating RN: [name] Primary Care Ladainian Therien: [name] Other Clinician: Referring Milianna Ericsson: [name] Michael Inioluwa Baris/Extender: [name] in Treatment: 31 Vital Signs Height(in): 65 Pulse(bpm): 65 Weight(lbs): 161 Blood Pressure(mmHg): 151/68 Body Mass Index(BMI): 27 Temperature(F): 98.1 Respiratory Rate 18 (breaths/min): Photos: [1:No Photos] [2:No Photos] [N/A:N/A] Wound Location: [1:Right Abdomen - midline] [2:Left Abdomen - midline] [N/A:N/A] Wounding Event: [1:Gradually Appeared] [2:Gradually Appeared] [N/A:N/A] Primary Etiology: [1:Atypical] [2:Atypical] [N/A:N/A] Comorbid History: [1:Cataracts, Chronic Obstructive Cataracts, Chronic Obstructive N/A Pulmonary Disease (COPD), Pulmonary Disease (COPD), Hypertension, Type II Diabetes, Osteoarthritis, Neuropathy] [2:Hypertension, Type II Diabetes, Osteoarthritis,  Neuropathy] Date Acquired: [1:05/17/2015] [2:05/17/2015] [N/A:N/A] Weeks of Treatment: [1:31] [2:31] [N/A:N/A] Wound Status: [1:Open] [2:Open] [N/A:N/A] Clustered Wound: [1:No] [2:Yes] [N/A:N/A] Clustered Quantity: [1:N/A] [2:3] [N/A:N/A] Measurements L x  W x D [1:0.1x0.1x0.1] [2:5x0.5x0.1] [N/A:N/A] (cm) Area (cm) : [1:0.008] [2:1.963] [N/A:N/A] Volume (cm) : [1:0.001] [2:0.196] [N/A:N/A] % Reduction in Area: [1:99.80%] [2:62.80%] [N/A:N/A] % Reduction in Volume: [1:99.70%] [2:62.90%] [N/A:N/A] Classification: [1:Full Thickness Without Exposed Support Structures] [2:Full Thickness Without Exposed Support Structures] [N/A:N/A] Exudate Amount: [1:Medium] [2:Large] [N/A:N/A] Exudate Type: [1:Serosanguineous] [2:Purulent] [N/A:N/A] Exudate Color: [1:red, Orrick] [2:yellow, Heid, green] [N/A:N/A] Wound Margin: [1:Flat and Intact] [2:Flat and Intact] [N/A:N/A] Granulation Amount: [1:Large (67-100%)] [2:Medium (34-66%)] [N/A:N/A] Granulation Quality: [1:Pink] [2:Red] [N/A:N/A] Necrotic Amount: [1:None Present (0%)] [2:Medium (34-66%)] [N/A:N/A] Necrotic Tissue: [1:N/A] [2:Eschar, Adherent Slough] [N/A:N/A] Exposed Structures: [  1:Fascia: No Fat Layer (Subcutaneous Tissue) Exposed: No Tendon: No Muscle: No Joint: No] [2:Fascia: No Fat Layer (Subcutaneous Tissue) Exposed: No Tendon: No Muscle: No Joint: No] [N/A:N/A] Bone: No Bone: No Limited to Skin Breakdown Limited to Skin Breakdown Epithelialization: Medium (34-66%) Small (1-33%) N/A Periwound Skin Texture: Scarring: Yes Induration: Yes N/A Excoriation: No Scarring: Yes Induration: No Excoriation: No Callus: No Callus: No Crepitus: No Crepitus: No Rash: No Rash: No Periwound Skin Moisture: Maceration: No Maceration: No N/A Dry/Scaly: No Dry/Scaly: No Periwound Skin Color: Atrophie Blanche: No Ecchymosis: Yes N/A Cyanosis: No Atrophie Blanche: No Ecchymosis: No Cyanosis: No Erythema: No Erythema: No Hemosiderin Staining: No Hemosiderin Staining: No Mottled: No Mottled: No Pallor: No Pallor: No Rubor: No Rubor: No Temperature: No Abnormality No Abnormality N/A Tenderness on Palpation: No No N/A Wound Preparation: Ulcer Cleansing: Ulcer Cleansing:  N/A Rinsed/Irrigated with Saline Rinsed/Irrigated with Saline Topical Anesthetic Applied: Topical Anesthetic Applied: Other: lidocaine 4% Other: lidocaine 4% Treatment Notes Electronic Signature(s) Signed: 02/09/2017 10:36:06 AM By: Christin Fudge MD, FACS Entered By: Christin Fudge on 02/09/2017 10:36:06 [name] ([id number]) -------------------------------------------------------------------------------- [place] Details Patient Name: [name], [name] Date of Service: 02/09/2017 10:15 AM Medical Record Number: [id number] Patient Account Number: [id number] Date of Birth/Sex: [birth year] (71 y.o. Male) Treating RN: [name] Primary Care Pearlena Ow: [name] Other Clinician: Referring Johnchristopher Sarvis: [name] Michael Shanekia Latella/Extender: [name] in Treatment: 31 Active Inactive ` Abuse / Safety / Falls / Self Care Management Nursing Diagnoses: Impaired physical mobility Goals: Patient will remain injury free Date Initiated: 07/03/2016 Target Resolution Date: 09/14/2016 Goal Status: Active Interventions: Assess fall risk on admission and as needed Notes: ` Orientation to the Wound Care Program Nursing Diagnoses: Knowledge deficit related to the wound healing center program Goals: Patient/caregiver will verbalize understanding of the [place] Date Initiated: 07/03/2016 Target Resolution Date: 09/15/2016 Goal Status: Active Interventions: Provide education on orientation to the wound center Notes: ` Wound/Skin Impairment Nursing Diagnoses: Impaired tissue integrity Goals: Patient/caregiver will verbalize understanding of skin care regimen Date Initiated: 07/03/2016 Target Resolution Date: 09/15/2016 Goal Status: Active Ulcer/skin breakdown will have a volume reduction of 30% by week 4 Date Initiated: 07/03/2016 Target Resolution Date: 09/15/2016 [name], [name] ([id number]) Goal Status: Active Ulcer/skin breakdown  will have a volume reduction of 50% by week 8 Date Initiated: 07/03/2016 Target Resolution Date: 09/15/2016 Goal Status: Active Ulcer/skin breakdown will have a volume reduction of 80% by week 12 Date Initiated: 07/03/2016 Target Resolution Date: 09/15/2016 Goal Status: Active Ulcer/skin breakdown will heal within 14 weeks Date Initiated: 07/03/2016 Target Resolution Date: 09/15/2016 Goal Status: Active Interventions: Assess patient/caregiver ability to obtain necessary supplies Assess patient/caregiver ability to perform ulcer/skin care regimen upon admission and as needed Assess ulceration(s) every visit Notes: Electronic Signature(s) Signed: 02/09/2017 4:46:33 PM By: [name] Entered By: [name] on 02/09/2017 10:30:58 [name] ([id number]) -------------------------------------------------------------------------------- Pain Assessment Details Patient Name: [name] Date of Service: 02/09/2017 10:15 AM Medical Record Number: [id number] Patient Account Number: [id number] Date of Birth/Sex: Jun 18, 1945 (71 y.o. Male) Treating RN: [name] Primary Care Gwyn Mehring: [name] Other Clinician: Referring Mackinley Kiehn: [name] Michael Glendale Youngblood/Extender: [name] in Treatment: 31 Active Problems Location of Pain Severity and Description of Pain Patient Has Paino No Site Locations Pain Management and Medication Current Pain Management: Electronic Signature(s) Signed: 02/09/2017 4:46:33 PM By: [name] Entered By: [name] on 02/09/2017 10:15:07 [name] ([id number]) -------------------------------------------------------------------------------- Patient/Caregiver Education Details Patient Name: [name]  Jenetta Downer Date of Service: 02/09/2017 10:15 AM Medical Record Number: [id number] Patient Account Number: [id number] Date of Birth/Gender: [birth year] (71 y.o. Male) Treating RN: [name] Primary Care Physician: [name] Other Clinician: Referring Physician: Thomes Jackson Michael Physician/Extender: [name] in Treatment: 67 Education Assessment Education Provided To: Patient Education Topics Provided Wound/Skin Impairment: Handouts: Other: wound care as ordered Methods: Demonstration, Explain/Verbal Responses: State content correctly Electronic Signature(s) Signed: 02/09/2017 4:46:33 PM By: [name] Entered By: [name] on 02/09/2017 11:59:48 [name] ([id number]) -------------------------------------------------------------------------------- Wound Assessment Details Patient Name: [name] Date of Service: 02/09/2017 10:15 AM Medical Record Number: [id number] Patient Account Number: [id number] Date of Birth/Sex: [birth year] (71 y.o. Male) Treating RN: [name] Primary Care Arrow Emmerich: [name] Other Clinician: Referring Mattye Verdone: [name] Michael Evany Schecter/Extender: [name] in Treatment: 31 Wound Status Wound Number: 1 Primary Atypical Etiology: Wound Location: Right Abdomen - midline Wound Open Wounding Event: Gradually Appeared Status: Date Acquired: 05/17/2015 Comorbid Cataracts, Chronic Obstructive Pulmonary Weeks Of Treatment: 31 History: Disease (COPD), Hypertension, Type II Clustered Wound: No Diabetes, Osteoarthritis, Neuropathy Photos Photo Uploaded By: Gretta Cool, BSN, RN, CWS, Kim on 02/09/2017 13:02:41 Wound Measurements Length: (cm) 0.1 Width: (cm) 0.1 Depth: (cm) 0.1 Area: (cm) 0.008 Volume: (cm) 0.001 % Reduction in Area: 99.8% % Reduction in Volume: 99.7% Epithelialization: Medium (34-66%) Tunneling: No Undermining: No Wound Description Full Thickness Without Exposed Support Foul Odo Classification: Structures Slough/F Wound Margin: Flat and Intact Exudate Medium Amount: Exudate Type: Serosanguineous Exudate Color: red, Heidrick r After Cleansing: No ibrino No Wound Bed Granulation Amount:  Large (67-100%) Exposed Structure Granulation Quality: Pink Fascia Exposed: No Necrotic Amount: None Present (0%) Fat Layer (Subcutaneous Tissue) Exposed: No Tendon Exposed: No Muscle Exposed: No Joint Exposed: No Bone Exposed: No [name], [name]. ([id number]) Limited to Skin Breakdown Periwound Skin Texture Texture Color No Abnormalities Noted: No No Abnormalities Noted: No Callus: No Atrophie Blanche: No Crepitus: No Cyanosis: No Excoriation: No Ecchymosis: No Induration: No Erythema: No Rash: No Hemosiderin Staining: No Scarring: Yes Mottled: No Pallor: No Moisture Rubor: No No Abnormalities Noted: No Dry / Scaly: No Temperature / Pain Maceration: No Temperature: No Abnormality Wound Preparation Ulcer Cleansing: Rinsed/Irrigated with Saline Topical Anesthetic Applied: Other: lidocaine 4%, Electronic Signature(s) Signed: 02/09/2017 4:46:33 PM By: [name] Entered By: [name] on 02/09/2017 10:30:29 [name] ([id number]) -------------------------------------------------------------------------------- Wound Assessment Details Patient Name: [name] Date of Service: 02/09/2017 10:15 AM Medical Record Number: [id number] Patient Account Number: [id number] Date of Birth/Sex: 08-Mar-1946 (71 y.o. Male) Treating RN: [name] Primary Care Teriyah Purington: [name] Other Clinician: Referring Ritamarie Arkin: [name] Michael Gaynor Ferreras/Extender: [name] in Treatment: 31 Wound Status Wound Number: 2 Primary Atypical Etiology: Wound Location: Left Abdomen - midline Wound Open Wounding Event: Gradually Appeared Status: Date Acquired: 05/17/2015 Comorbid Cataracts, Chronic Obstructive Pulmonary Weeks Of Treatment: 31 History: Disease (COPD), Hypertension, Type II Clustered Wound: Yes Diabetes, Osteoarthritis, Neuropathy Photos Photo Uploaded By: Gretta Cool, BSN, RN, CWS, Kim on 02/09/2017 13:02:42 Wound Measurements Length: (cm) 5 %  Reduct Width: (cm) 0.5 % Reduct Depth: (cm) 0.1 Epitheli Clustered Quantity: 3 Tunnelin Area: (cm) 1.963 Undermi Volume: (cm) 0.196 ion in Area: 62.8% ion in Volume: 62.9% alization: Small (1-33%) g: No ning: No Wound Description Full Thickness Without Exposed Support Foul Odo Classification: Structures Slough/F Wound Margin: Flat and Intact Exudate Large Amount: Exudate Type: Purulent Exudate Color: yellow, Escher, green r After Cleansing: No ibrino No Wound Bed Granulation Amount: Medium (34-66%) Exposed  Structure Granulation Quality: Red Fascia Exposed: No Necrotic Amount: Medium (34-66%) Fat Layer (Subcutaneous Tissue) Exposed: No Necrotic Quality: Eschar, Adherent Slough Tendon Exposed: No Muscle Exposed: No Joint Exposed: No [name], [name] ([id number]) Bone Exposed: No Limited to Skin Breakdown Periwound Skin Texture Texture Color No Abnormalities Noted: No No Abnormalities Noted: No Callus: No Atrophie Blanche: No Crepitus: No Cyanosis: No Excoriation: No Ecchymosis: Yes Induration: Yes Erythema: No Rash: No Hemosiderin Staining: No Scarring: Yes Mottled: No Pallor: No Moisture Rubor: No No Abnormalities Noted: No Dry / Scaly: No Temperature / Pain Maceration: No Temperature: No Abnormality Wound Preparation Ulcer Cleansing: Rinsed/Irrigated with Saline Topical Anesthetic Applied: Other: lidocaine 4%, Treatment Notes Wound #2 (Left Abdomen - midline) 1. Cleansed with: Clean wound with Normal Saline 2. Anesthetic Topical Lidocaine 4% cream to wound bed prior to debridement 4. Dressing Applied: Other dressing (specify in notes) 5. Secondary [place] Notes silvercel Electronic Signature(s) Signed: 02/09/2017 4:46:33 PM By: [name] Entered By: [name] on 02/09/2017 10:30:42 [name] ([id number]) -------------------------------------------------------------------------------- [place]  Details Patient Name: [name] Date of Service: 02/09/2017 10:15 AM Medical Record Number: [id number] Patient Account Number: [id number] Date of Birth/Sex: [birth year] (71 y.o. Male) Treating RN: [name] Primary Care Kyion Gautier: [name] Other Clinician: Referring Pama Roskos: [name] Michael Simren Popson/Extender: [name] in Treatment: 31 Vital Signs Time Taken: 10:15 Temperature (F): 98.1 Height (in): 65 Pulse (bpm): 81 Weight (lbs): 161 Respiratory Rate (breaths/min): 18 Body Mass Index (BMI): 26.8 Blood Pressure (mmHg): 151/68 Reference Range: 80 - 120 mg / dl Electronic Signature(s) Signed: 02/09/2017 4:46:33 PM By: [name] Entered By: [name] on 02/09/2017 10:17:35

## 2017-02-12 MED ORDER — GLIPIZIDE 5 MG TABLET
ORAL_TABLET | 1 refills | 0 days | Status: CP
Start: 2017-02-12 — End: 2017-08-13

## 2017-02-23 MED ORDER — RANITIDINE 150 MG CAPSULE
ORAL_CAPSULE | 3 refills | 0 days | Status: CP
Start: 2017-02-23 — End: 2017-09-10

## 2017-02-23 MED ORDER — RANITIDINE 150 MG TABLET
ORAL_TABLET | 3 refills | 0 days
Start: 2017-02-23 — End: 2018-02-23

## 2017-02-26 MED ORDER — SPIRIVA WITH HANDIHALER 18 MCG AND INHALATION CAPSULES
3 refills | 0 days | Status: CP
Start: 2017-02-26 — End: 2018-10-10

## 2017-03-09 ENCOUNTER — Encounter: Payer: Medicare HMO | Attending: Surgery | Admitting: Surgery

## 2017-03-09 DIAGNOSIS — E11622 Type 2 diabetes mellitus with other skin ulcer: Secondary | ICD-10-CM | POA: Insufficient documentation

## 2017-03-09 DIAGNOSIS — E1142 Type 2 diabetes mellitus with diabetic polyneuropathy: Secondary | ICD-10-CM | POA: Insufficient documentation

## 2017-03-09 DIAGNOSIS — M1990 Unspecified osteoarthritis, unspecified site: Secondary | ICD-10-CM | POA: Insufficient documentation

## 2017-03-09 DIAGNOSIS — S31102A Unspecified open wound of abdominal wall, epigastric region without penetration into peritoneal cavity, initial encounter: Secondary | ICD-10-CM | POA: Diagnosis not present

## 2017-03-09 DIAGNOSIS — J449 Chronic obstructive pulmonary disease, unspecified: Secondary | ICD-10-CM | POA: Diagnosis not present

## 2017-03-09 DIAGNOSIS — X58XXXA Exposure to other specified factors, initial encounter: Secondary | ICD-10-CM | POA: Insufficient documentation

## 2017-03-09 DIAGNOSIS — G8929 Other chronic pain: Secondary | ICD-10-CM | POA: Diagnosis not present

## 2017-03-09 DIAGNOSIS — E538 Deficiency of other specified B group vitamins: Secondary | ICD-10-CM | POA: Diagnosis not present

## 2017-03-09 DIAGNOSIS — F17228 Nicotine dependence, chewing tobacco, with other nicotine-induced disorders: Secondary | ICD-10-CM | POA: Diagnosis not present

## 2017-03-09 DIAGNOSIS — K219 Gastro-esophageal reflux disease without esophagitis: Secondary | ICD-10-CM | POA: Insufficient documentation

## 2017-03-09 DIAGNOSIS — Z79891 Long term (current) use of opiate analgesic: Secondary | ICD-10-CM | POA: Insufficient documentation

## 2017-03-09 DIAGNOSIS — Z79899 Other long term (current) drug therapy: Secondary | ICD-10-CM | POA: Insufficient documentation

## 2017-03-09 DIAGNOSIS — S31100A Unspecified open wound of abdominal wall, right upper quadrant without penetration into peritoneal cavity, initial encounter: Secondary | ICD-10-CM | POA: Diagnosis not present

## 2017-03-09 DIAGNOSIS — Z7984 Long term (current) use of oral hypoglycemic drugs: Secondary | ICD-10-CM | POA: Diagnosis not present

## 2017-03-09 DIAGNOSIS — I10 Essential (primary) hypertension: Secondary | ICD-10-CM | POA: Diagnosis not present

## 2017-03-10 NOTE — Progress Notes (Signed)
[name], [name] ([id number]) Visit Report for 03/09/2017 Arrival Information Details Patient Name: [name], [name]. Date of Service: 03/09/2017 10:15 AM Medical Record Number: [id number] Patient Account Number: [id number] Date of Birth/Sex: October 03, 1945 (71 y.o. Male) Treating RN: Cornell Barman Primary Care Wilmar Prabhakar: Thomes Lolling Other Clinician: Referring Dody Smartt: Thomes Lolling Treating Nary Sneed/Extender: Frann Rider in Treatment: 61 Visit Information History Since Last Visit Added or deleted any medications: No Patient Arrived: Ambulatory Any new allergies or adverse reactions: No Arrival Time: 10:10 Had a fall or experienced change in No Accompanied By: self activities of daily living that may affect Transfer Assistance: None risk of falls: Patient Identification Verified: Yes Signs or symptoms of abuse/neglect since last visito No Secondary Verification Process Completed: Yes Hospitalized since last visit: No Patient Requires Transmission-Based No Has Dressing in Place as Prescribed: Yes Precautions: Pain Present Now: No Patient Has Alerts: Yes Patient Alerts: DMII Electronic Signature(s) Signed: 03/09/2017 5:14:22 PM By: Gretta Cool, BSN, RN, CWS, Kim RN, BSN Entered By: Gretta Cool, BSN, RN, CWS, Kim on 03/09/2017 10:11:13 [name] ([id number]) -------------------------------------------------------------------------------- Clinic Level of Care Assessment Details Patient Name: [name], [name]. Date of Service: 03/09/2017 10:15 AM Medical Record Number: [id number] Patient Account Number: [id number] Date of Birth/Sex: 01-Oct-1945 (71 y.o. Male) Treating RN: Montey Hora Primary Care Griffyn Kucinski: Thomes Lolling Other Clinician: Referring Yailyn Strack: Thomes Lolling Treating Wynne Rozak/Extender: Frann Rider in Treatment: 35 Clinic Level of Care Assessment Items TOOL 4 Quantity Score []  - Use when only an EandM is performed on FOLLOW-UP visit 0 ASSESSMENTS - Nursing  Assessment / Reassessment X - Reassessment of Co-morbidities (includes updates in patient status) 1 10 X- 1 5 Reassessment of Adherence to Treatment Plan ASSESSMENTS - Wound and Skin Assessment / Reassessment []  - Simple Wound Assessment / Reassessment - one wound 0 X- 2 5 Complex Wound Assessment / Reassessment - multiple wounds []  - 0 Dermatologic / Skin Assessment (not related to wound area) ASSESSMENTS - Focused Assessment []  - Circumferential Edema Measurements - multi extremities 0 []  - 0 Nutritional Assessment / Counseling / Intervention []  - 0 Lower Extremity Assessment (monofilament, tuning fork, pulses) []  - 0 Peripheral Arterial Disease Assessment (using hand held doppler) ASSESSMENTS - Ostomy and/or Continence Assessment and Care []  - Incontinence Assessment and Management 0 []  - 0 Ostomy Care Assessment and Management (repouching, etc.) PROCESS - Coordination of Care X - Simple Patient / Family Education for ongoing care 1 15 []  - 0 Complex (extensive) Patient / Family Education for ongoing care []  - 0 Staff obtains Programmer, systems, Records, Test Results / Process Orders []  - 0 Staff telephones HHA, Nursing Homes / Clarify orders / etc []  - 0 Routine Transfer to another Facility (non-emergent condition) []  - 0 Routine Hospital Admission (non-emergent condition) []  - 0 New Admissions / Biomedical engineer / Ordering NPWT, Apligraf, etc. []  - 0 Emergency Hospital Admission (emergent condition) X- 1 10 Simple Discharge Coordination [name], [name] ([id number]) []  - 0 Complex (extensive) Discharge Coordination PROCESS - Special Needs []  - Pediatric / Minor Patient Management 0 []  - 0 Isolation Patient Management []  - 0 Hearing / Language / Visual special needs []  - 0 Assessment of Community assistance (transportation, D/C planning, etc.) []  - 0 Additional assistance / Altered mentation []  - 0 Support Surface(s) Assessment (bed, cushion, seat,  etc.) INTERVENTIONS - Wound Cleansing / Measurement []  - Simple Wound Cleansing - one wound 0 X- 2 5 Complex Wound Cleansing - multiple wounds X- 1 5 Wound Imaging (photographs - any  number of wounds) []  - 0 Wound Tracing (instead of photographs) []  - 0 Simple Wound Measurement - one wound X- 2 5 Complex Wound Measurement - multiple wounds INTERVENTIONS - Wound Dressings X - Small Wound Dressing one or multiple wounds 1 10 []  - 0 Medium Wound Dressing one or multiple wounds []  - 0 Large Wound Dressing one or multiple wounds []  - 0 Application of Medications - topical []  - 0 Application of Medications - injection INTERVENTIONS - Miscellaneous []  - External ear exam 0 []  - 0 Specimen Collection (cultures, biopsies, blood, body fluids, etc.) []  - 0 Specimen(s) / Culture(s) sent or taken to Lab for analysis []  - 0 Patient Transfer (multiple staff / Civil Service fast streamer / Similar devices) []  - 0 Simple Staple / Suture removal (25 or less) []  - 0 Complex Staple / Suture removal (26 or more) []  - 0 Hypo / Hyperglycemic Management (close monitor of Blood Glucose) []  - 0 Ankle / Brachial Index (ABI) - do not check if billed separately X- 1 5 Vital Signs [name], [name]. ([id number]) Has the patient been seen at the hospital within the last three years: Yes Total Score: 90 Level Of Care: New/Established - Level 3 Electronic Signature(s) Signed: 03/09/2017 4:57:04 PM By: Montey Hora Entered By: Montey Hora on 03/09/2017 10:37:42 [name] ([id number]) -------------------------------------------------------------------------------- Encounter Discharge Information Details Patient Name: [name] Date of Service: 03/09/2017 10:15 AM Medical Record Number: [id number] Patient Account Number: [id number] Date of Birth/Sex: May 27, 1945 (71 y.o. Male) Treating RN: Cornell Barman Primary Care Aibhlinn Kalmar: Thomes Lolling Other Clinician: Referring Yuka Lallier: Thomes Lolling Treating  Lanise Mergen/Extender: Frann Rider in Treatment: 35 Encounter Discharge Information Items Discharge Pain Level: 0 Discharge Condition: Stable Ambulatory Status: Ambulatory Discharge Destination: Home Transportation: Private Auto Accompanied By: self Schedule Follow-up Appointment: Yes Medication Reconciliation completed and No provided to Patient/Care Zyere Jiminez: Patient Clinical Summary of Care: Declined Electronic Signature(s) Signed: 03/09/2017 10:38:26 AM By: Montey Hora Entered By: Montey Hora on 03/09/2017 10:38:26 [name] ([id number]) -------------------------------------------------------------------------------- Lower Extremity Assessment Details Patient Name: [name] Date of Service: 03/09/2017 10:15 AM Medical Record Number: [id number] Patient Account Number: [id number] Date of Birth/Sex: [birth year] (71 y.o. Male) Treating RN: Cornell Barman Primary Care Carloyn Lahue: Thomes Lolling Other Clinician: Referring Zakar Brosch: Thomes Lolling Treating Ersilia Brawley/Extender: Frann Rider in Treatment: 35 Electronic Signature(s) Signed: 03/09/2017 5:14:22 PM By: Gretta Cool, BSN, RN, CWS, Kim RN, BSN Entered By: Gretta Cool, BSN, RN, CWS, Kim on 03/09/2017 10:16:45 [name] ([id number]) -------------------------------------------------------------------------------- Multi Wound Chart Details Patient Name: [name], [name] Date of Service: 03/09/2017 10:15 AM Medical Record Number: [id number] Patient Account Number: [id number] Date of Birth/Sex: [birth year] (71 y.o. Male) Treating RN: Cornell Barman Primary Care Keaun Schnabel: Thomes Lolling Other Clinician: Referring Myeisha Kruser: Thomes Lolling Treating Laquesha Holcomb/Extender: Frann Rider in Treatment: 35 Vital Signs Height(in): 65 Pulse(bpm): 72 Weight(lbs): 161 Blood Pressure(mmHg): 185/87 Body Mass Index(BMI): 27 Temperature(F): 97.8 Respiratory Rate 16 (breaths/min): Photos: [N/A:N/A] Wound Location: Right  Abdomen - midline Left Abdomen - midline N/A Wounding Event: Gradually Appeared Gradually Appeared N/A Primary Etiology: Atypical Atypical N/A Comorbid History: Cataracts, Chronic Obstructive Cataracts, Chronic Obstructive N/A Pulmonary Disease (COPD), Pulmonary Disease (COPD), Hypertension, Type II Hypertension, Type II Diabetes, Osteoarthritis, Diabetes, Osteoarthritis, Neuropathy Neuropathy Date Acquired: 05/17/2015 05/17/2015 N/A Weeks of Treatment: 35 35 N/A Wound Status: Healed - Epithelialized Open N/A Clustered Wound: No Yes N/A Clustered Quantity: N/A 3 N/A Measurements L x W x D 0x0x0 5x0.5x0.1 N/A (cm) Area (cm) : 0 1.963 N/A Volume (  cm) : 0 0.196 N/A % Reduction in Area: 100.00% 62.80% N/A % Reduction in Volume: 100.00% 62.90% N/A Classification: Full Thickness Without Full Thickness Without N/A Exposed Support Structures Exposed Support Structures Exudate Amount: N/A Medium N/A Exudate Type: N/A Purulent N/A Exudate Color: N/A yellow, [place], green N/A Wound Margin: N/A Flat and Intact N/A Granulation Amount: N/A Medium (34-66%) N/A Granulation Quality: N/A Red N/A Necrotic Amount: N/A Medium (34-66%) N/A Necrotic Tissue: N/A Eschar, Adherent Slough N/A Epithelialization: N/A Large (67-100%) N/A Periwound Skin Texture: No Abnormalities Noted N/A [name], [name] ([id number]) Induration: Yes Scarring: Yes Excoriation: No Callus: No Crepitus: No Rash: No Periwound Skin Moisture: No Abnormalities Noted Maceration: No N/A Dry/Scaly: No Periwound Skin Color: No Abnormalities Noted Ecchymosis: Yes N/A Atrophie Blanche: No Cyanosis: No Erythema: No Hemosiderin Staining: No Mottled: No Pallor: No Rubor: No Temperature: N/A No Abnormality N/A Tenderness on Palpation: No No N/A Wound Preparation: N/A Ulcer Cleansing: N/A Rinsed/Irrigated with Saline Topical Anesthetic Applied: Other: lidocaine 4% Treatment Notes Wound #2 (Left Abdomen - midline) 1. Cleansed  with: Clean wound with Normal Saline 2. Anesthetic Topical Lidocaine 4% cream to wound bed prior to debridement 4. Dressing Applied: Other dressing (specify in notes) 5. Secondary [place] Notes silvercel Electronic Signature(s) Signed: 03/09/2017 10:42:46 AM By: Christin Fudge MD, FACS Entered By: Christin Fudge on 03/09/2017 10:42:45 [name] ([id number]) -------------------------------------------------------------------------------- [place] Details Patient Name: [name], [name] Date of Service: 03/09/2017 10:15 AM Medical Record Number: [id number] Patient Account Number: [id number] Date of Birth/Sex: 17-Mar-1945 (71 y.o. Male) Treating RN: Cornell Barman Primary Care Tahiri Shareef: Thomes Lolling Other Clinician: Referring Dhani Imel: Thomes Lolling Treating Aloria Looper/Extender: Frann Rider in Treatment: 64 Active Inactive ` Abuse / Safety / Falls / Self Care Management Nursing Diagnoses: Impaired physical mobility Goals: Patient will remain injury free Date Initiated: 07/03/2016 Target Resolution Date: 09/14/2016 Goal Status: Active Interventions: Assess fall risk on admission and as needed Notes: ` Orientation to the Wound Care Program Nursing Diagnoses: Knowledge deficit related to the wound healing center program Goals: Patient/caregiver will verbalize understanding of the [place] Date Initiated: 07/03/2016 Target Resolution Date: 09/15/2016 Goal Status: Active Interventions: Provide education on orientation to the wound center Notes: ` Wound/Skin Impairment Nursing Diagnoses: Impaired tissue integrity Goals: Patient/caregiver will verbalize understanding of skin care regimen Date Initiated: 07/03/2016 Target Resolution Date: 09/15/2016 Goal Status: Active Ulcer/skin breakdown will have a volume reduction of 30% by week 4 Date Initiated: 07/03/2016 Target Resolution Date: 09/15/2016 JAION, LAGRANGE  ([id number]) Goal Status: Active Ulcer/skin breakdown will have a volume reduction of 50% by week 8 Date Initiated: 07/03/2016 Target Resolution Date: 09/15/2016 Goal Status: Active Ulcer/skin breakdown will have a volume reduction of 80% by week 12 Date Initiated: 07/03/2016 Target Resolution Date: 09/15/2016 Goal Status: Active Ulcer/skin breakdown will heal within 14 weeks Date Initiated: 07/03/2016 Target Resolution Date: 09/15/2016 Goal Status: Active Interventions: Assess patient/caregiver ability to obtain necessary supplies Assess patient/caregiver ability to perform ulcer/skin care regimen upon admission and as needed Assess ulceration(s) every visit Notes: Electronic Signature(s) Signed: 03/09/2017 5:14:22 PM By: Gretta Cool, BSN, RN, CWS, Kim RN, BSN Entered By: Gretta Cool, BSN, RN, CWS, Kim on 03/09/2017 10:18:26 [name], [name] ([id number]) -------------------------------------------------------------------------------- Pain Assessment Details Patient Name: [name], [name]. Date of Service: 03/09/2017 10:15 AM Medical Record Number: [id number] Patient Account Number: [id number] Date of Birth/Sex: [birth year] (71 y.o. Male) Treating RN: Cornell Barman Primary Care Catriona Dillenbeck: Thomes Lolling Other Clinician: Referring Swayzee Wadley: Thomes Lolling Treating Michaiah Maiden/Extender:  Britto, Errol Weeks in Treatment: 35 Active Problems Location of Pain Severity and Description of Pain Patient Has Paino No Site Locations With Dressing Change: No Pain Management and Medication Current Pain Management: Electronic Signature(s) Signed: 03/09/2017 5:14:22 PM By: Gretta Cool, BSN, RN, CWS, Kim RN, BSN Entered By: Gretta Cool, BSN, RN, CWS, Kim on 03/09/2017 10:11:20 [name] ([id number]) -------------------------------------------------------------------------------- Patient/Caregiver Education Details Patient Name: [name], [name] Date of Service: 03/09/2017 10:15 AM Medical Record Number: [id number] Patient  Account Number: [id number] Date of Birth/Gender: 06-05-1945 (71 y.o. Male) Treating RN: Montey Hora Primary Care Physician: Thomes Lolling Other Clinician: Referring Physician: Thomes Lolling Treating Physician/Extender: Frann Rider in Treatment: 43 Education Assessment Education Provided To: Patient Education Topics Provided Wound/Skin Impairment: Handouts: Other: wound care to continue as ordered Methods: Demonstration, Explain/Verbal Responses: State content correctly Electronic Signature(s) Signed: 03/09/2017 4:57:04 PM By: Montey Hora Entered By: Montey Hora on 03/09/2017 10:38:44 [name] ([id number]) -------------------------------------------------------------------------------- Wound Assessment Details Patient Name: [name] Date of Service: 03/09/2017 10:15 AM Medical Record Number: [id number] Patient Account Number: [id number] Date of Birth/Sex: [birth year] (71 y.o. Male) Treating RN: Cornell Barman Primary Care Henryetta Corriveau: Thomes Lolling Other Clinician: Referring Neely Kammerer: Thomes Lolling Treating Jaylee Freeze/Extender: Frann Rider in Treatment: 35 Wound Status Wound Number: 1 Primary Atypical Etiology: Wound Location: Right Abdomen - midline Wound Healed - Epithelialized Wounding Event: Gradually Appeared Status: Date Acquired: 05/17/2015 Comorbid Cataracts, Chronic Obstructive Pulmonary Weeks Of Treatment: 35 History: Disease (COPD), Hypertension, Type II Clustered Wound: No Diabetes, Osteoarthritis, Neuropathy Photos Photo Uploaded By: Gretta Cool, BSN, RN, CWS, Kim on 03/09/2017 10:23:23 Wound Measurements Length: (cm) Width: (cm) Depth: (cm) Area: (cm) Volume: (cm) 0 % Reduction in Area: 100% 0 % Reduction in Volume: 100% 0 0 0 Wound Description Full Thickness Without Exposed Support Classification: Structures Periwound Skin Texture Texture Color No Abnormalities Noted: No No Abnormalities Noted: No Moisture No  Abnormalities Noted: No Electronic Signature(s) Signed: 03/09/2017 5:14:22 PM By: Gretta Cool, BSN, RN, CWS, Kim RN, BSN Entered By: Gretta Cool, BSN, RN, CWS, Kim on 03/09/2017 10:16:17 [name] ([id number]) -------------------------------------------------------------------------------- Wound Assessment Details Patient Name: [name] Date of Service: 03/09/2017 10:15 AM Medical Record Number: [id number] Patient Account Number: [id number] Date of Birth/Sex: 27-Jul-1945 (71 y.o. Male) Treating RN: Cornell Barman Primary Care Jelena Malicoat: Thomes Lolling Other Clinician: Referring Wilmarie Sparlin: Thomes Lolling Treating Fraida Veldman/Extender: Frann Rider in Treatment: 35 Wound Status Wound Number: 2 Primary Atypical Etiology: Wound Location: Left Abdomen - midline Wound Open Wounding Event: Gradually Appeared Status: Date Acquired: 05/17/2015 Comorbid Cataracts, Chronic Obstructive Pulmonary Weeks Of Treatment: 35 History: Disease (COPD), Hypertension, Type II Clustered Wound: Yes Diabetes, Osteoarthritis, Neuropathy Photos Photo Uploaded By: Gretta Cool, BSN, RN, CWS, Kim on 03/09/2017 10:23:23 Wound Measurements Length: (cm) 5 Width: (cm) 0.5 Depth: (cm) 0.1 Clustered Quantity: 3 Area: (cm) 1.963 Volume: (cm) 0.196 % Reduction in Area: 62.8% % Reduction in Volume: 62.9% Epithelialization: Large (67-100%) Wound Description Full Thickness Without Exposed Support [place] Classification: Structures Slough/F Wound Margin: Flat and Intact Exudate Medium Amount: Exudate Type: Purulent Exudate Color: yellow, [place], green r After Cleansing: No ibrino No Wound Bed Granulation Amount: Medium (34-66%) Exposed Structure Granulation Quality: Red Fascia Exposed: No Necrotic Amount: Medium (34-66%) Fat Layer (Subcutaneous Tissue) Exposed: No Necrotic Quality: Eschar, Adherent Slough Tendon Exposed: No Muscle Exposed: No Joint Exposed: No Bone Exposed: No Limited to Skin  Breakdown Latino, K[place] O. ([id number]) Periwound Skin Texture Texture Color No Abnormalities Noted: No No Abnormalities Noted: No Callus: No Atrophie Blanche:  No Crepitus: No Cyanosis: No Excoriation: No Ecchymosis: Yes Induration: Yes Erythema: No Rash: No Hemosiderin Staining: No Scarring: Yes Mottled: No Pallor: No Moisture Rubor: No No Abnormalities Noted: No Dry / Scaly: No Temperature / Pain Maceration: No Temperature: No Abnormality Wound Preparation Ulcer Cleansing: Rinsed/Irrigated with Saline Topical Anesthetic Applied: Other: lidocaine 4%, Treatment Notes Wound #2 (Left Abdomen - midline) 1. Cleansed with: Clean wound with Normal Saline 2. Anesthetic Topical Lidocaine 4% cream to wound bed prior to debridement 4. Dressing Applied: Other dressing (specify in notes) 5. Secondary [place] Notes silvercel Electronic Signature(s) Signed: 03/09/2017 5:14:22 PM By: Gretta Cool, BSN, RN, CWS, Kim RN, BSN Entered By: Gretta Cool, BSN, RN, CWS, Kim on 03/09/2017 10:17:57 [name], FERRONE ([id number]) -------------------------------------------------------------------------------- Vitals Details Patient Name: DEREC, MOZINGO Date of Service: 03/09/2017 10:15 AM Medical Record Number: [id number] Patient Account Number: [id number] Date of Birth/Sex: Jan 21, 1946 (71 y.o. Male) Treating RN: Cornell Barman Primary Care Zeppelin Commisso: Thomes Lolling Other Clinician: Referring Adarryl Goldammer: Thomes Lolling Treating Jonmichael Beadnell/Extender: Frann Rider in Treatment: 35 Vital Signs Time Taken: 10:12 Temperature (F): 97.8 Height (in): 65 Pulse (bpm): 72 Weight (lbs): 161 Respiratory Rate (breaths/min): 16 Body Mass Index (BMI): 26.8 Blood Pressure (mmHg): 185/87 Reference Range: 80 - 120 mg / dl Electronic Signature(s) Signed: 03/09/2017 5:14:22 PM By: Gretta Cool, BSN, RN, CWS, Kim RN, BSN Entered By: Gretta Cool, BSN, RN, CWS, Kim on 03/09/2017 10:13:15

## 2017-03-10 NOTE — Progress Notes (Signed)
[name] ([id number]) Visit Report for 03/09/2017 Chief Complaint Document Details Patient Name: [name], [name]. Date of Service: 03/09/2017 10:15 AM Medical Record Number: [id number] Patient Account Number: [id number] Date of Birth/Sex: 1945/10/16 (71 y.o. Male) Treating RN: [name] Primary Care Provider: Thomes Jackson Other Clinician: Referring Provider: Thomes Jackson Treating Provider/Extender: [name] in Treatment: 27 Information Obtained from: Patient Chief Complaint Patients presents for treatment of an open diabetic ulcer to the anterior abdominal wall in the epigastric and right upper quadrant Electronic Signature(s) Signed: 03/09/2017 10:42:52 AM By: Michael Fudge MD, FACS Entered By: [name] on 03/09/2017 10:42:52 [name] ([id number]) -------------------------------------------------------------------------------- HPI Details Patient Name: [name] Date of Service: 03/09/2017 10:15 AM Medical Record Number: [id number] Patient Account Number: [id number] Date of Birth/Sex: 24-Oct-1945 (71 y.o. Male) Treating RN: [name] Primary Care Provider: Thomes Jackson Other Clinician: Referring Provider: Thomes Jackson Treating Provider/Extender: [name] in Treatment: 35 History of Present Illness Location: abdomen wall wounds,-- right upper quadrant and epigastric region Quality: Patient reports experiencing a dull pain to affected area(s). Severity: Patient states wound are getting worse. Duration: Patient has had the wound for >5 year's prior to seeking treatment at the wound center Timing: Pain in wound is Intermittent (comes and goes Context: The wound appeared gradually over time Modifying Factors: Consults to this date include:not seen a surgeon and only sees his PCP for his medical complaints Associated Signs and Symptoms: Patient reports having increase discharge. HPI Description: [name] has been seen  recently by his PCP [name], who sees him with a history of diabetes mellitus, peripheral neuropathy, B12 deficiency, hypertension, osteoarthritis and the patient had recent blood sugar checked which was 163. Past medical history includes essential hypertension, incisional hernia, diabetes mellitus, colon polyps, COPD, tobacco abuse in the past, chewing tobacco, GERD. He has also been treated for chronic back pain with no sciatica and is on oxycodone. His last hemoglobin A1c was 6.9% His abdominal surgery was done at [facility] over 15 years ago and gradually there was sutures which protruded and then there was mesh which protruded. He washes this during his shower but does not put any dressing and the wound is covered with a lot of debris. He has never had a surgical consultation for this. 07/10/2016 -- the patient has spoken to his PCP who said he was going to call me but I have not yet had a phone conversation with the physician. From what I understand the physician was reluctant to have a surgical opinion because of the patient's several comorbidities and he feels that the surgery may be detrimental to his overall health 07/24/2016 -- the patient's daughter is at the bedside and we've had a detailed discussion regarding his options for a surgical opinion and possible surgical intervention before this becomes a emergent situation. She will seek primary care input and take the father to [facility] for a surgical opinion. 07/31/2016 -- the patient's daughter has organized for a surgical opinion at [facility] on June 14 08/14/2016 -- it has been about 3 weeks since he has stopped chewing tobacco. 08/28/2016 -- he was seen at [facility] by Dr. Roby Lofts -- the assessment was based on physical examination and review of his previous history of surgical repair. the patient had undergone a ventral hernia repair in 1998 with Dr. Quay Burow at [facility] at a prior open  cholecystectomy incision. he was evaluated by Dr. Denice Paradise in 2013 and at that  time it was decided to watch and wait. Dr. Payton Doughty recommended a repeat CT scan and requested records from dura and regional operative records and would follow-up the patient, after Mr. Rivere PCP Dr. Gwenlyn Saran reviewed him for overall fitness for surgery. 09/04/16 patient's wound actually appear to be doing well on evaluation today. He is not really experience any discomfort and the right abdominal wound is dry and almost appears to be healing up. He continues to have some drainage from the left but this seems to be more of a potential seroma which with milking is able to be cleared out. I discussed with patient that when he performs his dressing changes at home it would be beneficial for him to do this as well. Otherwise there's no evidence of infection. He is still waiting on his CT scan and appointments to determine whether he is going to have another surgery. 09/11/2016 -- the patient has had a CT scan and is awaiting his surgical appointment prior to deciding on any surgical intervention. 09/25/16 patient presents today for fault evaluation concerning his ongoing abdominal surgical wounds. Unfortunately they do not appear to be improving significantly but rather fluctuate from a little better to little worse week by week. Obviously this is somewhat frustrating for him. [name], [name] ([id number]) 10/02/2016 -- the patient has completed his CT scan, and has a review by the surgeons at the end of August. He has a new area which has opened up a little below his left abdominal wound. 10/16/16 on evaluation today patient's wounds on the abdominal region appears to be doing about the same. He actually has an appointment with his surgeon on the 21st for evaluation regarding the required products appointment. Obviously he is having a difficult time with the feeling of these wounds. Fortunately there does not appear to be any  evidence of local infection such as significant amount of erythema or streaking. She also has an audio, vomiting, or diarrhea. He is not have any discomfort at this point. 10/30/2016 -- the surgical review is tomorrow and I am awaiting the input of the surgeon. 11/16/2016 -- I had a call from his surgeon Dr. Payton Doughty, on phone number [phone] who was kind enough to call me regarding his care. After a thorough review and having consulted the abdominal wall reconstruction team they have decided that surgery will be too extensive and rather moribund for this Jackson and have asked him to continue with local care at the wound center. She will send me an official note via Epic. 03/09/2017 -- the patient comes for some abdominal wall wounds which were caused by protruding mesh and complete surgical review was done at [facility] and no surgery has been recommended. He continues to get palliative care and the right abdominal wound is completely healed. He has a few open spots on the left which are being treated with palliative care and we see him once a month Electronic Signature(s) Signed: 03/09/2017 10:43:37 AM By: Michael Fudge MD, FACS Entered By: [name] on 03/09/2017 10:43:37 [name] ([id number]) -------------------------------------------------------------------------------- Physical Exam Details Patient Name: [name] Date of Service: 03/09/2017 10:15 AM Medical Record Number: [id number] Patient Account Number: [id number] Date of Birth/Sex: Jun 23, 1945 (71 y.o. Male) Treating RN: [name] Primary Care Provider: Thomes Jackson Other Clinician: Referring Provider: Thomes Jackson Treating Provider/Extender: [name] in Treatment: 35 Constitutional . Pulse regular. Respirations normal and unlabored. Afebrile. . Eyes Nonicteric. Reactive to light. Ears, Nose, Mouth, and Throat Lips, teeth,  and gums WNL.[name] Moist mucosa without lesions. Neck supple and  nontender. No palpable supraclavicular or cervical adenopathy. Normal sized without goiter. Respiratory WNL. No retractions.. Cardiovascular Pedal Pulses WNL. No clubbing, cyanosis or edema. Lymphatic No adneopathy. No adenopathy. No adenopathy. Musculoskeletal Adexa without tenderness or enlargement.. Digits and nails w/o clubbing, cyanosis, infection, petechiae, ischemia, or inflammatory conditions.. Integumentary (Hair, Skin) No suspicious lesions. No crepitus or fluctuance. No peri-wound warmth or erythema. No masses.[name] Psychiatric Judgement and insight Intact.. No evidence of depression, anxiety, or agitation.. Notes the right upper quadrant wound is completely healed in the left epigastric wound which has minimal open areas has no protruding mesh and no sharp debridement was required today. Electronic Signature(s) Signed: 03/09/2017 10:44:18 AM By: Michael Fudge MD, FACS Entered By: [name] on 03/09/2017 10:44:16 [name] ([id number]) -------------------------------------------------------------------------------- Physician Orders Details Patient Name: DOROTEO, NICKOLSON Date of Service: 03/09/2017 10:15 AM Medical Record Number: [id number] Patient Account Number: [id number] Date of Birth/Sex: 1945-07-27 (71 y.o. Male) Treating RN: Montey Hora Primary Care Provider: Thomes Jackson Other Clinician: Referring Provider: Thomes Jackson Treating Provider/Extender: [name] in Treatment: 19 Verbal / Phone Orders: No Diagnosis Coding Wound Cleansing Wound #2 Left Abdomen - midline o Clean wound with Normal Saline. o May Shower, gently pat wound dry prior to applying new dressing. Skin Barriers/Peri-Wound Care Wound #2 Left Abdomen - midline o Skin Prep Primary Wound Dressing Wound #2 Left Abdomen - midline o Other: - PolyMem Silver Secondary Dressing Wound #2 Left Abdomen - midline o Other - telfa island Dressing Change Frequency Wound #2  Left Abdomen - midline o Change dressing every other day. Electronic Signature(s) Signed: 03/09/2017 1:42:42 PM By: Michael Fudge MD, FACS Signed: 03/09/2017 4:57:04 PM By: Montey Hora Entered By: Montey Hora on 03/09/2017 10:29:04 [name] ([id number]) -------------------------------------------------------------------------------- Problem List Details Patient Name: MAKYLE, ESLICK Date of Service: 03/09/2017 10:15 AM Medical Record Number: [id number] Patient Account Number: [id number] Date of Birth/Sex: 1945/07/17 (71 y.o. Male) Treating RN: [name] Primary Care Provider: Thomes Jackson Other Clinician: Referring Provider: Thomes Jackson Treating Provider/Extender: [name] in Treatment: 32 Active Problems ICD-10 Encounter Code Description Active Date Diagnosis E11.622 Type 2 diabetes mellitus with other skin ulcer 07/03/2016 Yes S31.100A Unspecified open wound of abdominal wall, right upper quadrant 07/03/2016 Yes without penetration into peritoneal cavity, initial encounter S31.102A Unspecified open wound of abdominal wall, epigastric region 07/03/2016 Yes without penetration into peritoneal cavity, initial encounter F17.228 Nicotine dependence, chewing tobacco, with other nicotine-induced 07/03/2016 Yes disorders Inactive Problems Resolved Problems Electronic Signature(s) Signed: 03/09/2017 10:42:40 AM By: Michael Fudge MD, FACS Entered By: [name] on 03/09/2017 10:42:40 [name] ([id number]) -------------------------------------------------------------------------------- Progress Note Details Patient Name: [name] Date of Service: 03/09/2017 10:15 AM Medical Record Number: [id number] Patient Account Number: [id number] Date of Birth/Sex: [birth year] (71 y.o. Male) Treating RN: [name] Primary Care Provider: Thomes Jackson Other Clinician: Referring Provider: Thomes Jackson Treating Provider/Extender: [name] in  Treatment: 21 Subjective Chief Complaint Information obtained from Patient Patients presents for treatment of an open diabetic ulcer to the anterior abdominal wall in the epigastric and right upper quadrant History of Present Illness (HPI) The following HPI elements were documented for the patient's wound: Location: abdomen wall wounds,-- right upper quadrant and epigastric region Quality: Patient reports experiencing a dull pain to affected area(s). Severity: Patient states wound are getting worse. Duration: Patient has had the wound for >5 year's prior to seeking treatment at the wound center Timing:  Pain in wound is Intermittent (comes and goes Context: The wound appeared gradually over time Modifying Factors: Consults to this date include:not seen a surgeon and only sees his PCP for his medical complaints Associated Signs and Symptoms: Patient reports having increase discharge. 71 year old Jackson has been seen recently by his PCP [name], who sees him with a history of diabetes mellitus, peripheral neuropathy, B12 deficiency, hypertension, osteoarthritis and the patient had recent blood sugar checked which was 163. Past medical history includes essential hypertension, incisional hernia, diabetes mellitus, colon polyps, COPD, tobacco abuse in the past, chewing tobacco, GERD. He has also been treated for chronic back pain with no sciatica and is on oxycodone. His last hemoglobin A1c was 6.9% His abdominal surgery was done at [facility] over 15 years ago and gradually there was sutures which protruded and then there was mesh which protruded. He washes this during his shower but does not put any dressing and the wound is covered with a lot of debris. He has never had a surgical consultation for this. 07/10/2016 -- the patient has spoken to his PCP who said he was going to call me but I have not yet had a phone conversation with the physician. From what I understand the  physician was reluctant to have a surgical opinion because of the patient's several comorbidities and he feels that the surgery may be detrimental to his overall health 07/24/2016 -- the patient's daughter is at the bedside and we've had a detailed discussion regarding his options for a surgical opinion and possible surgical intervention before this becomes a emergent situation. She will seek primary care input and take the father to [facility] for a surgical opinion. 07/31/2016 -- the patient's daughter has organized for a surgical opinion at [facility] on June 14 08/14/2016 -- it has been about 3 weeks since he has stopped chewing tobacco. 08/28/2016 -- he was seen at [facility] by Dr. Roby Lofts -- the assessment was based on physical examination and review of his previous history of surgical repair. the patient had undergone a ventral hernia repair in 1998 with Dr. Quay Burow at [facility] at a prior open cholecystectomy incision. he was evaluated by Dr. Denice Paradise in 2013 and at that time it was decided to watch and wait. Dr. Payton Doughty recommended a repeat CT scan and requested records from dura and regional operative records and would follow-up the patient, after Mr. Henningsen PCP Dr. Gwenlyn Saran reviewed him for overall fitness for surgery. 09/04/16 patient's wound actually appear to be doing well on evaluation today. He is not really experience any discomfort and the right abdominal wound is dry and almost appears to be healing up. He continues to have some drainage from the left but this seems to be more of a potential seroma which with milking is able to be cleared out. I discussed with patient that when he performs his dressing changes at home it would be beneficial for him to do this as well. Otherwise there's no evidence of ROMULUS, HANRAHAN. ([id number]) infection. He is still waiting on his CT scan and appointments to determine whether he is going to have another surgery. 09/11/2016 -- the  patient has had a CT scan and is awaiting his surgical appointment prior to deciding on any surgical intervention. 09/25/16 patient presents today for fault evaluation concerning his ongoing abdominal surgical wounds. Unfortunately they do not appear to be improving significantly but rather fluctuate from a little better to little worse week by  week. Obviously this is somewhat frustrating for him. 10/02/2016 -- the patient has completed his CT scan, and has a review by the surgeons at the end of August. He has a new area which has opened up a little below his left abdominal wound. 10/16/16 on evaluation today patient's wounds on the abdominal region appears to be doing about the same. He actually has an appointment with his surgeon on the 21st for evaluation regarding the required products appointment. Obviously he is having a difficult time with the feeling of these wounds. Fortunately there does not appear to be any evidence of local infection such as significant amount of erythema or streaking. She also has an audio, vomiting, or diarrhea. He is not have any discomfort at this point. 10/30/2016 -- the surgical review is tomorrow and I am awaiting the input of the surgeon. 11/16/2016 -- I had a call from his surgeon Dr. Payton Doughty, on phone number [phone] who was kind enough to call me regarding his care. After a thorough review and having consulted the abdominal wall reconstruction team they have decided that surgery will be too extensive and rather moribund for this Jackson and have asked him to continue with local care at the wound center. She will send me an official note via Epic. 03/09/2017 -- the patient comes for some abdominal wall wounds which were caused by protruding mesh and complete surgical review was done at [facility] and no surgery has been recommended. He continues to get palliative care and the right abdominal wound is completely healed. He has a few open spots on the  left which are being treated with palliative care and we see him once a month Patient History Information obtained from Patient. Social History Former smoker - chew tobacco, Marital Status - Widowed, Alcohol Use - Never, Drug Use - No History, Caffeine Use - Daily. Medical And Surgical History Notes Oncologic 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 Objective Constitutional Pulse regular. Respirations normal and unlabored. Afebrile. Vitals Time Taken: 10:12 AM, Height: 65 in, Weight: 161 lbs, BMI: 26.8, Temperature: 97.8 F, Pulse: 72 bpm, Respiratory Rate: 16 breaths/min, Blood Pressure: 185/87 mmHg. KELTIN, BAIRD ([id number]) Eyes Nonicteric. Reactive to light. Ears, Nose, Mouth, and Throat Lips, teeth, and gums WNL.[name] Moist mucosa without lesions. Neck supple and nontender. No palpable supraclavicular or cervical adenopathy. Normal sized without goiter. Respiratory WNL. No retractions.. Cardiovascular Pedal Pulses WNL. No clubbing, cyanosis or edema. Lymphatic No adneopathy. No adenopathy. No adenopathy. Musculoskeletal Adexa without tenderness or enlargement.. Digits and nails w/o clubbing, cyanosis, infection, petechiae, ischemia, or inflammatory conditions.[name] Psychiatric Judgement and insight Intact.. No evidence of depression, anxiety, or agitation.. General Notes: the right upper quadrant wound is completely healed in the left epigastric wound which has minimal open areas has no protruding mesh and no sharp debridement was required today. Integumentary (Hair, Skin) No suspicious lesions. No crepitus or fluctuance. No peri-wound warmth or erythema. No masses.. Wound #1 status is Healed - Epithelialized. Original cause of wound was Gradually Appeared. The wound is located on the Right Abdomen - midline. The wound measures 0cm length x 0cm width x 0cm depth; 0cm^2 area and 0cm^3 volume. Wound #2 status is Open. Original cause of wound was Gradually Appeared. The wound  is located on the Left Abdomen - midline. The wound measures 5cm length x 0.5cm width x 0.1cm depth; 1.963cm^2 area and 0.196cm^3 volume. The wound is limited to skin breakdown. There is a medium amount of purulent drainage  noted. The wound margin is flat and intact. There is medium (34-66%) red granulation within the wound bed. There is a medium (34-66%) amount of necrotic tissue within the wound bed including Eschar and Adherent Slough. The periwound skin appearance exhibited: Induration, Scarring, Ecchymosis. The periwound skin appearance did not exhibit: Callus, Crepitus, Excoriation, Rash, Dry/Scaly, Maceration, Atrophie Blanche, Cyanosis, Hemosiderin Staining, Mottled, Pallor, Rubor, Erythema. Periwound temperature was noted as No Abnormality. Assessment Active Problems ICD-10 E11.622 - Type 2 diabetes mellitus with other skin ulcer S31.100A - Unspecified open wound of abdominal wall, right upper quadrant without penetration into peritoneal cavity, initial encounter S31.102A - Unspecified open wound of abdominal wall, epigastric region without penetration into peritoneal cavity, initial encounter F17.228 - Nicotine dependence, chewing tobacco, with other nicotine-induced disorders DUNG, SALINGER ([id number]) Plan Wound Cleansing: Wound #2 Left Abdomen - midline: Clean wound with Normal Saline. May Shower, gently pat wound dry prior to applying new dressing. Skin Barriers/Peri-Wound Care: Wound #2 Left Abdomen - midline: Skin Prep Primary Wound Dressing: Wound #2 Left Abdomen - midline: Other: - PolyMem Silver Secondary Dressing: Wound #2 Left Abdomen - midline: Other - telfa island Dressing Change Frequency: Wound #2 Left Abdomen - midline: Change dressing every other day. the patient is making a slow but definite recovery and continues to do well with palliative care for superficial sinus tracts arising from protruding mesh and after review today I have asked him to  continue with PolyMem silver dressing every 4 days or so and to see as back for review in about a month's time Electronic Signature(s) Signed: 03/09/2017 10:45:00 AM By: Michael Fudge MD, FACS Entered By: [name] on 03/09/2017 10:45:00 [name] ([id number]) -------------------------------------------------------------------------------- ROS/PFSH Details Patient Name: [name] Date of Service: 03/09/2017 10:15 AM Medical Record Number: [id number] Patient Account Number: [id number] Date of Birth/Sex: 1945-08-05 (71 y.o. Male) Treating RN: [name] Primary Care Provider: Thomes Jackson Other Clinician: Referring Provider: Thomes Jackson Treating Provider/Extender: [name] in Treatment: 22 Information Obtained From Patient Wound History Do you currently have one or more open woundso Yes How many open wounds do you currently haveo 2 Approximately how long have you had your woundso more than 1 year How have you been treating your wound(s) until nowo open to air Has your wound(s) ever healed and then re-openedo No Have you had any lab work done in the past montho No Have you tested positive for an antibiotic resistant organism (MRSA, VRE)o No Have you tested positive for osteomyelitis (bone infection)o No Have you had any tests for circulation on your legso No Eyes Medical History: Positive for: Cataracts - removed Hematologic/Lymphatic Medical History: Negative for: Anemia; Hemophilia; Human Immunodeficiency Virus; Lymphedema; Sickle Cell Disease Respiratory Medical History: Positive for: Chronic Obstructive Pulmonary Disease (COPD) Negative for: Aspiration; Asthma; Pneumothorax; Sleep Apnea; Tuberculosis Cardiovascular Medical History: Positive for: Hypertension Negative for: Angina; Arrhythmia; Congestive Heart Failure; Coronary Artery Disease; Deep Vein Thrombosis; Hypotension; Myocardial Infarction; Peripheral Arterial Disease; Peripheral Venous  Disease; Phlebitis; Vasculitis Gastrointestinal Medical History: Negative for: Cirrhosis ; Colitis; Crohnos; Hepatitis A; Hepatitis B; Hepatitis C Endocrine Medical History: Positive for: Type II Diabetes Treated with: Oral agents Blood sugar tested every day: No LEARY, MCNULTY ([id number]) Genitourinary Medical History: Negative for: End Stage Renal Disease Immunological Medical History: Negative for: Lupus Erythematosus; Raynaudos; Scleroderma Integumentary (Skin) Medical History: Negative for: History of Burn; History of pressure wounds Musculoskeletal Medical History: Positive for: Osteoarthritis Negative for: Gout; Rheumatoid Arthritis; Osteomyelitis Neurologic Medical History: Positive for:  Neuropathy Negative for: Dementia; Quadriplegia; Paraplegia; Seizure Disorder Oncologic Medical History: Negative for: Received Chemotherapy; Received Radiation Past Medical History Notes: 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 Psychiatric Medical History: Negative for: Anorexia/bulimia; Confinement Anxiety HBO Extended History Items Eyes: Cataracts Immunizations Pneumococcal Vaccine: Received Pneumococcal Vaccination: Yes Immunization Notes: up to date Implantable Devices Family and Social History Former smoker - chew tobacco; Marital Status - Widowed; Alcohol Use: Never; Drug Use: No History; Caffeine Use: Daily; Financial Concerns: No; Food, Clothing or Shelter Needs: No; Support System Lacking: No; Transportation Concerns: No; Advanced Directives: No; Patient does not want information on Advanced Directives Physician Affirmation I have reviewed and agree with the above information. ROMMIE, DUNN ([id number]) Electronic Signature(s) Signed: 03/09/2017 1:42:42 PM By: Michael Fudge MD, FACS Signed: 03/09/2017 5:14:22 PM By: Gretta Cool BSN, RN, CWS, Kim RN, BSN Entered By: [name] on 03/09/2017 10:43:46 SYLVAIN, HASTEN  ([id number]) -------------------------------------------------------------------------------- SuperBill Details Patient Name: JAHDIEL, KROL Date of Service: 03/09/2017 Medical Record Number: [id number] Patient Account Number: [id number] Date of Birth/Sex: Feb 24, 1946 (71 y.o. Male) Treating RN: [name] Primary Care Provider: Thomes Jackson Other Clinician: Referring Provider: Thomes Jackson Treating Provider/Extender: [name] in Treatment: 35 Diagnosis Coding ICD-10 Codes Code Description E11.622 Type 2 diabetes mellitus with other skin ulcer Unspecified open wound of abdominal wall, right upper quadrant without penetration into peritoneal S31.100A cavity, initial encounter Unspecified open wound of abdominal wall, epigastric region without penetration into peritoneal cavity, S31.102A initial encounter F17.228 Nicotine dependence, chewing tobacco, with other nicotine-induced disorders Facility Procedures CPT4 Code: 67619509 Description: 99213 - WOUND CARE VISIT-LEV 3 EST PT Modifier: Quantity: 1 Physician Procedures CPT4: Description Modifier Quantity Code 3267124 99213 - WC PHYS LEVEL 3 - EST PT 1 ICD-10 Diagnosis Description E11.622 Type 2 diabetes mellitus with other skin ulcer S31.100A Unspecified open wound of abdominal wall, right upper quadrant without  penetration into peritoneal cavity, initial encounter S31.102A Unspecified open wound of abdominal wall, epigastric region without penetration into peritoneal cavity, initial encounter F17.228 Nicotine dependence, chewing tobacco, with other  nicotine-induced disorders Electronic Signature(s) Signed: 03/09/2017 10:45:15 AM By: Michael Fudge MD, FACS Entered By: [name] on 03/09/2017 10:45:15

## 2017-03-20 MED ORDER — METFORMIN 500 MG TABLET
ORAL_TABLET | Freq: Two times a day (BID) | ORAL | 3 refills | 0.00000 days | Status: CP
Start: 2017-03-20 — End: 2018-03-07

## 2017-04-03 ENCOUNTER — Ambulatory Visit: Admit: 2017-04-03 | Discharge: 2017-04-04 | Payer: MEDICARE | Attending: Internal Medicine | Primary: Internal Medicine

## 2017-04-03 DIAGNOSIS — E1142 Type 2 diabetes mellitus with diabetic polyneuropathy: Principal | ICD-10-CM

## 2017-04-03 DIAGNOSIS — D126 Benign neoplasm of colon, unspecified: Secondary | ICD-10-CM

## 2017-04-03 DIAGNOSIS — I10 Essential (primary) hypertension: Secondary | ICD-10-CM

## 2017-04-03 DIAGNOSIS — G8929 Other chronic pain: Secondary | ICD-10-CM

## 2017-04-03 DIAGNOSIS — K5900 Constipation, unspecified: Secondary | ICD-10-CM

## 2017-04-03 DIAGNOSIS — M545 Low back pain: Secondary | ICD-10-CM

## 2017-04-03 MED ORDER — SENNOSIDES 8.6 MG TABLET
ORAL_TABLET | Freq: Every day | ORAL | 11 refills | 0.00000 days | Status: CP
Start: 2017-04-03 — End: 2018-04-03

## 2017-04-03 MED ORDER — HYDROCODONE 5 MG-ACETAMINOPHEN 325 MG TABLET: 1 | tablet | Freq: Four times a day (QID) | 0 refills | 0 days | Status: AC

## 2017-04-03 MED ORDER — HYDROCODONE 5 MG-ACETAMINOPHEN 325 MG TABLET
ORAL_TABLET | Freq: Four times a day (QID) | ORAL | 0 refills | 0.00000 days | Status: CP | PRN
Start: 2017-04-03 — End: 2017-04-05

## 2017-04-05 MED ORDER — OXYCODONE-ACETAMINOPHEN 5 MG-325 MG TABLET
ORAL_TABLET | ORAL | 0 refills | 0 days | Status: CP | PRN
Start: 2017-04-05 — End: 2017-05-05

## 2017-04-06 ENCOUNTER — Encounter: Payer: Medicare HMO | Attending: Physician Assistant | Admitting: Physician Assistant

## 2017-04-06 DIAGNOSIS — G8929 Other chronic pain: Secondary | ICD-10-CM | POA: Insufficient documentation

## 2017-04-06 DIAGNOSIS — Z7984 Long term (current) use of oral hypoglycemic drugs: Secondary | ICD-10-CM | POA: Insufficient documentation

## 2017-04-06 DIAGNOSIS — E11622 Type 2 diabetes mellitus with other skin ulcer: Secondary | ICD-10-CM | POA: Diagnosis not present

## 2017-04-06 DIAGNOSIS — I10 Essential (primary) hypertension: Secondary | ICD-10-CM | POA: Diagnosis not present

## 2017-04-06 DIAGNOSIS — K219 Gastro-esophageal reflux disease without esophagitis: Secondary | ICD-10-CM | POA: Diagnosis not present

## 2017-04-06 DIAGNOSIS — Z79899 Other long term (current) drug therapy: Secondary | ICD-10-CM | POA: Diagnosis not present

## 2017-04-06 DIAGNOSIS — S31102A Unspecified open wound of abdominal wall, epigastric region without penetration into peritoneal cavity, initial encounter: Secondary | ICD-10-CM | POA: Insufficient documentation

## 2017-04-06 DIAGNOSIS — M1990 Unspecified osteoarthritis, unspecified site: Secondary | ICD-10-CM | POA: Insufficient documentation

## 2017-04-06 DIAGNOSIS — S31100A Unspecified open wound of abdominal wall, right upper quadrant without penetration into peritoneal cavity, initial encounter: Secondary | ICD-10-CM | POA: Diagnosis not present

## 2017-04-06 DIAGNOSIS — J449 Chronic obstructive pulmonary disease, unspecified: Secondary | ICD-10-CM | POA: Insufficient documentation

## 2017-04-06 DIAGNOSIS — X58XXXA Exposure to other specified factors, initial encounter: Secondary | ICD-10-CM | POA: Insufficient documentation

## 2017-04-06 DIAGNOSIS — Z79891 Long term (current) use of opiate analgesic: Secondary | ICD-10-CM | POA: Insufficient documentation

## 2017-04-06 DIAGNOSIS — E538 Deficiency of other specified B group vitamins: Secondary | ICD-10-CM | POA: Insufficient documentation

## 2017-04-06 DIAGNOSIS — F17228 Nicotine dependence, chewing tobacco, with other nicotine-induced disorders: Secondary | ICD-10-CM | POA: Insufficient documentation

## 2017-04-06 DIAGNOSIS — E1142 Type 2 diabetes mellitus with diabetic polyneuropathy: Secondary | ICD-10-CM | POA: Insufficient documentation

## 2017-04-08 NOTE — Progress Notes (Signed)
FREMAN, LAPAGE ([id number]) Visit Report for 04/06/2017 Arrival Information Details Patient Name: [name], [name]. Date of Service: 04/06/2017 10:15 AM Medical Record Number: [id number] Patient Account Number: [id number] Date of Birth/Sex: January 02, 1946 (72 y.o. Male) Treating RN: Montey Hora Primary Care Oziel Beitler: Thomes Lolling Other Clinician: Referring Virgal Warmuth: Thomes Lolling Treating Pema Thomure/Extender: Sharalyn Ink in Treatment: 36 Visit Information History Since Last Visit Added or deleted any medications: No Patient Arrived: Ambulatory Any new allergies or adverse reactions: No Arrival Time: 10:32 Had a fall or experienced change in No Accompanied By: self activities of daily living that may affect Transfer Assistance: None risk of falls: Patient Identification Verified: Yes Signs or symptoms of abuse/neglect since last visito No Secondary Verification Process Completed: Yes Hospitalized since last visit: No Patient Requires Transmission-Based No Has Dressing in Place as Prescribed: Yes Precautions: Pain Present Now: No Patient Has Alerts: Yes Patient Alerts: DMII Electronic Signature(s) Signed: 04/06/2017 4:37:30 PM By: Montey Hora Entered By: Montey Hora on 04/06/2017 10:33:12 [name] ([id number]) -------------------------------------------------------------------------------- Encounter Discharge Information Details Patient Name: [name] Date of Service: 04/06/2017 10:15 AM Medical Record Number: [id number] Patient Account Number: [id number] Date of Birth/Sex: Mar 25, 1945 (72 y.o. Male) Treating RN: Montey Hora Primary Care Damarious Holtsclaw: Thomes Lolling Other Clinician: Referring Laura Caldas: Thomes Lolling Treating Birda Didonato/Extender: Melburn Hake, HOYT Weeks in Treatment: 58 Encounter Discharge Information Items Discharge Pain Level: 0 Discharge Condition: Stable Ambulatory Status: Ambulatory Discharge Destination: Home Transportation: Private  Auto Accompanied By: self Schedule Follow-up Appointment: Yes Medication Reconciliation completed and No provided to Patient/Care Marqus Macphee: Patient Clinical Summary of Care: Declined Electronic Signature(s) Signed: 04/06/2017 11:00:21 AM By: Montey Hora Entered By: Montey Hora on 04/06/2017 11:00:20 [name] ([id number]) -------------------------------------------------------------------------------- Multi Wound Chart Details Patient Name: [name] Date of Service: 04/06/2017 10:15 AM Medical Record Number: [id number] Patient Account Number: [id number] Date of Birth/Sex: Feb 16, 1946 (72 y.o. Male) Treating RN: Montey Hora Primary Care Catherene Kaleta: Thomes Lolling Other Clinician: Referring Lesleigh Hughson: Thomes Lolling Treating Alaina Donati/Extender: Melburn Hake, HOYT Weeks in Treatment: 39 Vital Signs Height(in): 65 Pulse(bpm): 97 Weight(lbs): 161 Blood Pressure(mmHg): 158/68 Body Mass Index(BMI): 27 Temperature(F): 98.1 Respiratory Rate 16 (breaths/min): Photos: [2:No Photos] [N/A:N/A] Wound Location: [2:Left Abdomen - midline] [N/A:N/A] Wounding Event: [2:Gradually Appeared] [N/A:N/A] Primary Etiology: [2:Atypical] [N/A:N/A] Comorbid History: [2:Cataracts, Chronic Obstructive N/A Pulmonary Disease (COPD), Hypertension, Type II Diabetes, Osteoarthritis, Neuropathy] Date Acquired: [2:05/17/2015] [N/A:N/A] Weeks of Treatment: [2:39] [N/A:N/A] Wound Status: [2:Open] [N/A:N/A] Clustered Wound: [2:Yes] [N/A:N/A] Clustered Quantity: [2:3] [N/A:N/A] Measurements L x W x D [2:4.8x0.4x0.1] [N/A:N/A] (cm) Area (cm) : [2:1.508] [N/A:N/A] Volume (cm) : [2:0.151] [N/A:N/A] % Reduction in Area: [2:71.40%] [N/A:N/A] % Reduction in Volume: [2:71.40%] [N/A:N/A] Classification: [2:Full Thickness Without Exposed Support Structures] [N/A:N/A] Exudate Amount: [2:Medium] [N/A:N/A] Exudate Type: [2:Purulent] [N/A:N/A] Exudate Color: [2:yellow, [place], green] [N/A:N/A] Wound  Margin: [2:Flat and Intact] [N/A:N/A] Granulation Amount: [2:Medium (34-66%)] [N/A:N/A] Granulation Quality: [2:Red] [N/A:N/A] Necrotic Amount: [2:Medium (34-66%)] [N/A:N/A] Necrotic Tissue: [2:Eschar, Adherent Slough] [N/A:N/A] Exposed Structures: [2:Fascia: No Fat Layer (Subcutaneous Tissue) Exposed: No Tendon: No Muscle: No Joint: No] [N/A:N/A] Bone: No Limited to Skin Breakdown Epithelialization: Large (67-100%) N/A N/A Periwound Skin Texture: Induration: Yes N/A N/A Scarring: Yes Excoriation: No Callus: No Crepitus: No Rash: No Periwound Skin Moisture: Maceration: No N/A N/A Dry/Scaly: No Periwound Skin Color: Ecchymosis: Yes N/A N/A Atrophie Blanche: No Cyanosis: No Erythema: No Hemosiderin Staining: No Mottled: No Pallor: No Rubor: No Temperature: No Abnormality N/A N/A Tenderness on Palpation: No N/A N/A Wound Preparation: Ulcer Cleansing: N/A N/A  Rinsed/Irrigated with Saline Topical Anesthetic Applied: Other: lidocaine 4% Treatment Notes Electronic Signature(s) Signed: 04/06/2017 4:37:30 PM By: Montey Hora Entered By: Montey Hora on 04/06/2017 10:44:11 [name] ([id number]) -------------------------------------------------------------------------------- [place] Plan Details Patient Name: [name], [name] Date of Service: 04/06/2017 10:15 AM Medical Record Number: [id number] Patient Account Number: [id number] Date of Birth/Sex: [birth year] (72 y.o. Male) Treating RN: Montey Hora Primary Care Viggo Perko: Thomes Lolling Other Clinician: Referring Jonetta Dagley: Thomes Lolling Treating Mercedes Valeriano/Extender: Melburn Hake, HOYT Weeks in Treatment: 10 Active Inactive ` Abuse / Safety / Falls / Self Care Management Nursing Diagnoses: Impaired physical mobility Goals: Patient will remain injury free Date Initiated: 07/03/2016 Target Resolution Date: 09/14/2016 Goal Status: Active Interventions: Assess fall risk on admission and as  needed Notes: ` Orientation to the Wound Care Program Nursing Diagnoses: Knowledge deficit related to the wound healing center program Goals: Patient/caregiver will verbalize understanding of the [place] Date Initiated: 07/03/2016 Target Resolution Date: 09/15/2016 Goal Status: Active Interventions: Provide education on orientation to the wound center Notes: ` Wound/Skin Impairment Nursing Diagnoses: Impaired tissue integrity Goals: Patient/caregiver will verbalize understanding of skin care regimen Date Initiated: 07/03/2016 Target Resolution Date: 09/15/2016 Goal Status: Active Ulcer/skin breakdown will have a volume reduction of 30% by week 4 Date Initiated: 07/03/2016 Target Resolution Date: 09/15/2016 EISEN, ROBENSON ([id number]) Goal Status: Active Ulcer/skin breakdown will have a volume reduction of 50% by week 8 Date Initiated: 07/03/2016 Target Resolution Date: 09/15/2016 Goal Status: Active Ulcer/skin breakdown will have a volume reduction of 80% by week 12 Date Initiated: 07/03/2016 Target Resolution Date: 09/15/2016 Goal Status: Active Ulcer/skin breakdown will heal within 14 weeks Date Initiated: 07/03/2016 Target Resolution Date: 09/15/2016 Goal Status: Active Interventions: Assess patient/caregiver ability to obtain necessary supplies Assess patient/caregiver ability to perform ulcer/skin care regimen upon admission and as needed Assess ulceration(s) every visit Notes: Electronic Signature(s) Signed: 04/06/2017 4:37:30 PM By: Montey Hora Entered By: Montey Hora on 04/06/2017 10:44:01 [name] ([id number]) -------------------------------------------------------------------------------- Pain Assessment Details Patient Name: [name] Date of Service: 04/06/2017 10:15 AM Medical Record Number: [id number] Patient Account Number: [id number] Date of Birth/Sex: [birth year] (72 y.o. Male) Treating RN: Montey Hora Primary Care Avonda Toso:  Thomes Lolling Other Clinician: Referring Sukanya Goldblatt: Thomes Lolling Treating Jenessa Gillingham/Extender: Melburn Hake, HOYT Weeks in Treatment: 51 Active Problems Location of Pain Severity and Description of Pain Patient Has Paino No Site Locations Pain Management and Medication Current Pain Management: Electronic Signature(s) Signed: 04/06/2017 4:37:30 PM By: Montey Hora Entered By: Montey Hora on 04/06/2017 10:33:18 [name] ([id number]) -------------------------------------------------------------------------------- Patient/Caregiver Education Details Patient Name: [name] Date of Service: 04/06/2017 10:15 AM Medical Record Number: [id number] Patient Account Number: [id number] Date of Birth/Gender: 12/05/1945 (72 y.o. Male) Treating RN: Montey Hora Primary Care Physician: Thomes Lolling Other Clinician: Referring Physician: Thomes Lolling Treating Physician/Extender: Sharalyn Ink in Treatment: 64 Education Assessment Education Provided To: Patient Education Topics Provided Wound/Skin Impairment: Handouts: Other: wound care as ordered Methods: Demonstration, Explain/Verbal Responses: State content correctly Electronic Signature(s) Signed: 04/06/2017 4:37:30 PM By: Montey Hora Entered By: Montey Hora on 04/06/2017 11:00:38 [name] ([id number]) -------------------------------------------------------------------------------- Wound Assessment Details Patient Name: [name] Date of Service: 04/06/2017 10:15 AM Medical Record Number: [id number] Patient Account Number: [id number] Date of Birth/Sex: 11/30/1945 (72 y.o. Male) Treating RN: Montey Hora Primary Care Donyelle Enyeart: Thomes Lolling Other Clinician: Referring Aynsley Fleet: Thomes Lolling Treating Aniyla Harling/Extender: Melburn Hake, HOYT Weeks in Treatment: 39 Wound Status Wound Number: 2 Primary Atypical Etiology: Wound Location: Left  Abdomen - midline Wound Open Wounding Event: Gradually  Appeared Status: Date Acquired: 05/17/2015 Comorbid Cataracts, Chronic Obstructive Pulmonary Weeks Of Treatment: 39 History: Disease (COPD), Hypertension, Type II Clustered Wound: Yes Diabetes, Osteoarthritis, Neuropathy Photos Photo Uploaded By: Gretta Cool, BSN, RN, CWS, Kim on 04/06/2017 17:45:23 Wound Measurements Length: (cm) 4.8 % Reduct Width: (cm) 0.4 % Reduct Depth: (cm) 0.1 Epitheli Clustered Quantity: 3 Tunnelin Area: (cm) 1.508 Undermi Volume: (cm) 0.151 ion in Area: 71.4% ion in Volume: 71.4% alization: Large (67-100%) g: No ning: No Wound Description Full Thickness Without Exposed Support Foul Odo Classification: Structures Slough/F Wound Margin: Flat and Intact Exudate Medium Amount: Exudate Type: Purulent Exudate Color: yellow, Gol[place], green r After Cleansing: No ibrino No Wound Bed Granulation Amount: Medium (34-66%) Exposed Structure Granulation Quality: Red Fascia Exposed: No Necrotic Amount: Medium (34-66%) Fat Layer (Subcutaneous Tissue) Exposed: No Necrotic Quality: Eschar, Adherent Slough Tendon Exposed: No Muscle Exposed: No Joint Exposed: No TEJAS, SEA[place]OD ([id number]) Bone Exposed: No Limited to Skin Breakdown Periwound Skin Texture Texture Color No Abnormalities Noted: No No Abnormalities Noted: No Callus: No Atrophie Blanche: No Crepitus: No Cyanosis: No Excoriation: No Ecchymosis: Yes Induration: Yes Erythema: No Rash: No Hemosiderin Staining: No Scarring: Yes Mottled: No Pallor: No Moisture Rubor: No No Abnormalities Noted: No Dry / Scaly: No Temperature / Pain Maceration: No Temperature: No Abnormality Wound Preparation Ulcer Cleansing: Rinsed/Irrigated with Saline Topical Anesthetic Applied: Other: lidocaine 4%, Treatment Notes Wound #2 (Left Abdomen - midline) 1. Cleansed with: Clean wound with Normal Saline 2. Anesthetic Topical Lidocaine 4% cream to wound bed prior to debridement 4. Dressing Applied: Other  dressing (specify in notes) 5. Secondary [place] Notes [place] Electronic Signature(s) Signed: 04/06/2017 4:37:30 PM By: Montey Hora Entered By: Montey Hora on 04/06/2017 10:43:50 [name] ([id number]) -------------------------------------------------------------------------------- Vitals Details Patient Name: [name] Date of Service: 04/06/2017 10:15 AM Medical Record Number: [id number] Patient Account Number: [id number] Date of Birth/Sex: 07/06/1945 (72 y.o. Male) Treating RN: Montey Hora Primary Care Larcenia Holaday: Thomes Lolling Other Clinician: Referring Jaselyn Nahm: Thomes Lolling Treating Shaquavia Whisonant/Extender: Melburn Hake, HOYT Weeks in Treatment: 39 Vital Signs Time Taken: 10:34 Temperature (F): 98.1 Height (in): 65 Pulse (bpm): 97 Weight (lbs): 161 Respiratory Rate (breaths/min): 16 Body Mass Index (BMI): 26.8 Blood Pressure (mmHg): 158/68 Reference Range: 80 - 120 mg / dl Electronic Signature(s) Signed: 04/06/2017 4:37:30 PM By: Montey Hora Entered By: Montey Hora on 04/06/2017 10:34:25

## 2017-04-10 NOTE — Progress Notes (Signed)
WOOD, NOVACEK ([id number]) Visit Report for 04/06/2017 Chief Complaint Document Details Patient Name: [name], [name]. Date of Service: 04/06/2017 10:15 AM Medical Record Number: [id number] Patient Account Number: [id number] Date of Birth/Sex: 1945/08/15 (72 y.o. Male) Treating RN: Montey Hora Primary Care Provider: Thomes Lolling Other Clinician: Referring Provider: Thomes Lolling Treating Provider/Extender: Sharalyn Ink in Treatment: 87 Information Obtained from: Patient Chief Complaint Patients presents for treatment of an open diabetic ulcer to the anterior abdominal wall in the epigastric and right upper quadrant Electronic Signature(s) Signed: 04/07/2017 12:23:43 AM By: Worthy Keeler PA-C Entered By: Worthy Keeler on 04/06/2017 10:42:04 [name] ([id number]) -------------------------------------------------------------------------------- Debridement Details Patient Name: [name] Date of Service: 04/06/2017 10:15 AM Medical Record Number: [id number] Patient Account Number: [id number] Date of Birth/Sex: 1945-03-15 (72 y.o. Male) Treating RN: Montey Hora Primary Care Provider: Thomes Lolling Other Clinician: Referring Provider: Thomes Lolling Treating Provider/Extender: Melburn Hake, HOYT Weeks in Treatment: 39 Debridement Performed for Wound #2 Left Abdomen - midline Assessment: Performed By: Physician STONE III, HOYT E., PA-C Debridement: Debridement Pre-procedure Verification/Time Yes - 10:47 Out Taken: Start Time: 10:47 Pain Control: Lidocaine 4% Topical Solution Level: Skin/Subcutaneous Tissue Total Area Debrided (L x W): 1.5 (cm) x 0.4 (cm) = 0.6 (cm) Tissue and other material Viable, Non-Viable, Other, Skin, Subcutaneous debrided: Instrument: Forceps, Scissors Bleeding: Minimum Hemostasis Achieved: Pressure End Time: 10:52 Procedural Pain: 0 Post Procedural Pain: 0 Response to Treatment: Procedure was tolerated well Post Debridement  Measurements of Total Wound Length: (cm) 4.8 Width: (cm) 0.4 Depth: (cm) 0.1 Volume: (cm) 0.151 Character of Wound/Ulcer Post Debridement: Improved Post Procedure Diagnosis Same as Pre-procedure Electronic Signature(s) Signed: 04/06/2017 4:37:30 PM By: Montey Hora Signed: 04/07/2017 12:23:43 AM By: Worthy Keeler PA-C Entered By: Montey Hora on 04/06/2017 10:53:51 [name] ([id number]) -------------------------------------------------------------------------------- HPI Details Patient Name: [name] Date of Service: 04/06/2017 10:15 AM Medical Record Number: [id number] Patient Account Number: [id number] Date of Birth/Sex: Nov 17, 1945 (72 y.o. Male) Treating RN: Montey Hora Primary Care Provider: Thomes Lolling Other Clinician: Referring Provider: Thomes Lolling Treating Provider/Extender: Melburn Hake, HOYT Weeks in Treatment: 88 History of Present Illness HPI Description: 72 year old gentleman has been seen recently by his PCP Dr. Thomes Lolling, who sees him with a history of diabetes mellitus, peripheral neuropathy, B12 deficiency, hypertension, osteoarthritis and the patient had recent blood sugar checked which was 163. Past medical history includes essential hypertension, incisional hernia, diabetes mellitus, colon polyps, COPD, tobacco abuse in the past, chewing tobacco, GERD. He has also been treated for chronic back pain with no sciatica and is on oxycodone. His last hemoglobin A1c was 6.9% His abdominal surgery was done at [facility] over 15 years ago and gradually there was sutures which protruded and then there was mesh which protruded. He washes this during his shower but does not put any dressing and the wound is covered with a lot of debris. He has never had a surgical consultation for this. 07/10/2016 -- the patient has spoken to his PCP who said he was going to call me but I have not yet had a phone conversation with the physician. From what I  understand the physician was reluctant to have a surgical opinion because of the patient's several comorbidities and he feels that the surgery may be detrimental to his overall health 07/24/2016 -- the patient's daughter is at the bedside and we've had a detailed discussion regarding his options for a surgical opinion and possible surgical intervention before this  becomes a emergent situation. She will seek primary care input and take the father to [facility] for a surgical opinion. 07/31/2016 -- the patient's daughter has organized for a surgical opinion at [facility] on June 14 08/14/2016 -- it has been about 3 weeks since he has stopped chewing tobacco. 08/28/2016 -- he was seen at [facility] by Dr. Roby Lofts -- the assessment was based on physical examination and review of his previous history of surgical repair. the patient had undergone a ventral hernia repair in 1998 with Dr. Quay Burow at [facility] at a prior open cholecystectomy incision. he was evaluated by Dr. Denice Paradise in 2013 and at that time it was decided to watch and wait. Dr. Payton Doughty recommended a repeat CT scan and requested records from dura and regional operative records and would follow-up the patient, after Mr. Porter PCP Dr. Gwenlyn Saran reviewed him for overall fitness for surgery. 09/04/16 patient's wound actually appear to be doing well on evaluation today. He is not really experience any discomfort and the right abdominal wound is dry and almost appears to be healing up. He continues to have some drainage from the left but this seems to be more of a potential seroma which with milking is able to be cleared out. I discussed with patient that when he performs his dressing changes at home it would be beneficial for him to do this as well. Otherwise there's no evidence of infection. He is still waiting on his CT scan and appointments to determine whether he is going to have another surgery. 09/11/2016 -- the patient has had  a CT scan and is awaiting his surgical appointment prior to deciding on any surgical intervention. 09/25/16 patient presents today for fault evaluation concerning his ongoing abdominal surgical wounds. Unfortunately they do not appear to be improving significantly but rather fluctuate from a little better to little worse week by week. Obviously this is somewhat frustrating for him. 10/02/2016 -- the patient has completed his CT scan, and has a review by the surgeons at the end of August. He has a new area which has opened up a little below his left abdominal wound. 10/16/16 on evaluation today patient's wounds on the abdominal region appears to be doing about the same. He actually has an appointment with his surgeon on the 21st for evaluation regarding the required products appointment. Obviously he is having a difficult time with the feeling of these wounds. Fortunately there does not appear to be any evidence of local infection such as significant amount of erythema or streaking. She also has an audio, vomiting, or diarrhea. He is not have any discomfort at this point. [name], [name] ([id number]) 10/30/2016 -- the surgical review is tomorrow and I am awaiting the input of the surgeon. 11/16/2016 -- I had a call from his surgeon Dr. Payton Doughty, on phone number [phone] who was kind enough to call me regarding his care. After a thorough review and having consulted the abdominal wall reconstruction team they have decided that surgery will be too extensive and rather moribund for this gentleman and have asked him to continue with local care at the wound center. She will send me an official note via Epic. 03/09/2017 -- the patient comes for some abdominal wall wounds which were caused by protruding mesh and complete surgical review was done at [facility] and no surgery has been recommended. He continues to get palliative care and the right abdominal wound is completely healed. He has a few open  spots on the left which are being treated with palliative care and we see him once a month 04/06/17 on evaluation today patient is doing about the same in regard to his abdominal wound. He does have some mesh that is actually sticking out at this point. With that being said that is something I do think we can address by trimming today in order to hopefully allow for some additional granulation and filling in in general. He does not have any significant pain which is good news. Electronic Signature(s) Signed: 04/07/2017 12:23:43 AM By: Worthy Keeler PA-C Entered By: Worthy Keeler on 04/06/2017 17:25:55 [name] ([id number]) -------------------------------------------------------------------------------- Physical Exam Details Patient Name: [name], [name] Date of Service: 04/06/2017 10:15 AM Medical Record Number: [id number] Patient Account Number: [id number] Date of Birth/Sex: 29-May-1945 (72 y.o. Male) Treating RN: Montey Hora Primary Care Provider: Thomes Lolling Other Clinician: Referring Provider: Thomes Lolling Treating Provider/Extender: Melburn Hake, HOYT Weeks in Treatment: 55 Constitutional Well-nourished and well-hydrated in no acute distress. Respiratory normal breathing without difficulty. Psychiatric this patient is able to make decisions and demonstrates good insight into disease process. Alert and Oriented x 3. pleasant and cooperative. Notes Patient's wound again did have several pieces of mesh which were sticking out and filaments around the wound bed. I will see this is preventing any epithelialization in closing. Fortunately he is not having significant pain and I was able to trim these protruding filaments which will hopefully help the wound. With that being said it's questionable whether this is going to be something that completely healed over or not. Nonetheless we will still attempt treatment as we can. I did also trim the rolled edge between the one and 4  o'clock location which will hopefully help with granulation. Electronic Signature(s) Signed: 04/07/2017 12:23:43 AM By: Worthy Keeler PA-C Entered By: Worthy Keeler on 04/06/2017 17:27:55 [name] ([id number]) -------------------------------------------------------------------------------- Physician Orders Details Patient Name: [name], [name] Date of Service: 04/06/2017 10:15 AM Medical Record Number: [id number] Patient Account Number: [id number] Date of Birth/Sex: 09/20/1945 (72 y.o. Male) Treating RN: Montey Hora Primary Care Provider: Thomes Lolling Other Clinician: Referring Provider: Thomes Lolling Treating Provider/Extender: Sharalyn Ink in Treatment: 41 Verbal / Phone Orders: No Diagnosis Coding ICD-10 Coding Code Description E11.622 Type 2 diabetes mellitus with other skin ulcer Unspecified open wound of abdominal wall, right upper quadrant without penetration into peritoneal S31.100A cavity, initial encounter Unspecified open wound of abdominal wall, epigastric region without penetration into peritoneal cavity, S31.102A initial encounter F17.228 Nicotine dependence, chewing tobacco, with other nicotine-induced disorders Wound Cleansing Wound #2 Left Abdomen - midline o Clean wound with Normal Saline. o May Shower, gently pat wound dry prior to applying new dressing. Skin Barriers/Peri-Wound Care Wound #2 Left Abdomen - midline o Skin Prep Primary Wound Dressing Wound #2 Left Abdomen - midline o Silvercel Non-Adherent - in clinic o Other: - PolyMem Silver Secondary Dressing Wound #2 Left Abdomen - midline o Other - telfa island Dressing Change Frequency Wound #2 Left Abdomen - midline o Change dressing every other day. Follow-up Appointments Wound #2 Left Abdomen - midline o Return Appointment in 1 month Electronic Signature(s) Signed: 04/06/2017 4:37:30 PM By: Montey Hora Signed: 04/07/2017 12:23:43 AM By: Worthy Keeler  PA-C Entered By: Montey Hora on 04/06/2017 10:54:31 [name], [name] ([id number]) [name], [name] ([id number]) -------------------------------------------------------------------------------- Problem List Details Patient Name: [name], [name] Date of Service: 04/06/2017 10:15 AM Medical Record Number: [id number] Patient Account Number: [id number] Date of  Birth/Sex: 18-Oct-1945 (72 y.o. Male) Treating RN: Montey Hora Primary Care Provider: Thomes Lolling Other Clinician: Referring Provider: Thomes Lolling Treating Provider/Extender: Sharalyn Ink in Treatment: 11 Active Problems ICD-10 Encounter Code Description Active Date Diagnosis E11.622 Type 2 diabetes mellitus with other skin ulcer 07/03/2016 Yes S31.100A Unspecified open wound of abdominal wall, right upper quadrant 07/03/2016 Yes without penetration into peritoneal cavity, initial encounter S31.102A Unspecified open wound of abdominal wall, epigastric region 07/03/2016 Yes without penetration into peritoneal cavity, initial encounter F17.228 Nicotine dependence, chewing tobacco, with other nicotine-induced 07/03/2016 Yes disorders Inactive Problems Resolved Problems Electronic Signature(s) Signed: 04/07/2017 12:23:43 AM By: Worthy Keeler PA-C Entered By: Worthy Keeler on 04/06/2017 10:41:58 [name] ([id number]) -------------------------------------------------------------------------------- Progress Note Details Patient Name: [name] Date of Service: 04/06/2017 10:15 AM Medical Record Number: [id number] Patient Account Number: [id number] Date of Birth/Sex: Jun 18, 1945 (72 y.o. Male) Treating RN: Montey Hora Primary Care Provider: Thomes Lolling Other Clinician: Referring Provider: Thomes Lolling Treating Provider/Extender: Sharalyn Ink in Treatment: 17 Subjective Chief Complaint Information obtained from Patient Patients presents for treatment of an open diabetic ulcer to the anterior  abdominal wall in the epigastric and right upper quadrant History of Present Illness (HPI) 72 year old gentleman has been seen recently by his PCP Dr. Thomes Lolling, who sees him with a history of diabetes mellitus, peripheral neuropathy, B12 deficiency, hypertension, osteoarthritis and the patient had recent blood sugar checked which was 163. Past medical history includes essential hypertension, incisional hernia, diabetes mellitus, colon polyps, COPD, tobacco abuse in the past, chewing tobacco, GERD. He has also been treated for chronic back pain with no sciatica and is on oxycodone. His last hemoglobin A1c was 6.9% His abdominal surgery was done at [facility] over 15 years ago and gradually there was sutures which protruded and then there was mesh which protruded. He washes this during his shower but does not put any dressing and the wound is covered with a lot of debris. He has never had a surgical consultation for this. 07/10/2016 -- the patient has spoken to his PCP who said he was going to call me but I have not yet had a phone conversation with the physician. From what I understand the physician was reluctant to have a surgical opinion because of the patient's several comorbidities and he feels that the surgery may be detrimental to his overall health 07/24/2016 -- the patient's daughter is at the bedside and we've had a detailed discussion regarding his options for a surgical opinion and possible surgical intervention before this becomes a emergent situation. She will seek primary care input and take the father to [facility] for a surgical opinion. 07/31/2016 -- the patient's daughter has organized for a surgical opinion at [facility] on June 14 08/14/2016 -- it has been about 3 weeks since he has stopped chewing tobacco. 08/28/2016 -- he was seen at [facility] by Dr. Roby Lofts -- the assessment was based on physical examination and review of his previous history of surgical  repair. the patient had undergone a ventral hernia repair in 1998 with Dr. Quay Burow at [facility] at a prior open cholecystectomy incision. he was evaluated by Dr. Denice Paradise in 2013 and at that time it was decided to watch and wait. Dr. Payton Doughty recommended a repeat CT scan and requested records from dura and regional operative records and would follow-up the patient, after Mr. Hamor PCP Dr. Gwenlyn Saran reviewed him for overall fitness for surgery. 09/04/16 patient's wound  actually appear to be doing well on evaluation today. He is not really experience any discomfort and the right abdominal wound is dry and almost appears to be healing up. He continues to have some drainage from the left but this seems to be more of a potential seroma which with milking is able to be cleared out. I discussed with patient that when he performs his dressing changes at home it would be beneficial for him to do this as well. Otherwise there's no evidence of infection. He is still waiting on his CT scan and appointments to determine whether he is going to have another surgery. 09/11/2016 -- the patient has had a CT scan and is awaiting his surgical appointment prior to deciding on any surgical intervention. 09/25/16 patient presents today for fault evaluation concerning his ongoing abdominal surgical wounds. Unfortunately they do not appear to be improving significantly but rather fluctuate from a little better to little worse week by week. Obviously this is somewhat frustrating for him. 10/02/2016 -- the patient has completed his CT scan, and has a review by the surgeons at the end of August. He has a new [name], [name]. ([id number]) area which has opened up a little below his left abdominal wound. 10/16/16 on evaluation today patient's wounds on the abdominal region appears to be doing about the same. He actually has an appointment with his surgeon on the 21st for evaluation regarding the required products appointment.  Obviously he is having a difficult time with the feeling of these wounds. Fortunately there does not appear to be any evidence of local infection such as significant amount of erythema or streaking. She also has an audio, vomiting, or diarrhea. He is not have any discomfort at this point. 10/30/2016 -- the surgical review is tomorrow and I am awaiting the input of the surgeon. 11/16/2016 -- I had a call from his surgeon Dr. Payton Doughty, on phone number [phone] who was kind enough to call me regarding his care. After a thorough review and having consulted the abdominal wall reconstruction team they have decided that surgery will be too extensive and rather moribund for this gentleman and have asked him to continue with local care at the wound center. She will send me an official note via Epic. 03/09/2017 -- the patient comes for some abdominal wall wounds which were caused by protruding mesh and complete surgical review was done at [facility] and no surgery has been recommended. He continues to get palliative care and the right abdominal wound is completely healed. He has a few open spots on the left which are being treated with palliative care and we see him once a month 04/06/17 on evaluation today patient is doing about the same in regard to his abdominal wound. He does have some mesh that is actually sticking out at this point. With that being said that is something I do think we can address by trimming today in order to hopefully allow for some additional granulation and filling in in general. He does not have any significant pain which is good news. Patient History Information obtained from Patient. Social History Former smoker - chew tobacco, Marital Status - Widowed, Alcohol Use - Never, Drug Use - No History, Caffeine Use - Daily. Medical And Surgical History Notes Oncologic 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 Review of Systems (ROS) Constitutional Symptoms (General  Health) Denies complaints or symptoms of Fever, Chills. Respiratory The patient has no complaints or symptoms. Cardiovascular The patient  has no complaints or symptoms. Psychiatric The patient has no complaints or symptoms. Objective Constitutional Well-nourished and well-hydrated in no acute distress. [name], [name] ([id number]) Vitals Time Taken: 10:34 AM, Height: 65 in, Weight: 161 lbs, BMI: 26.8, Temperature: 98.1 F, Pulse: 97 bpm, Respiratory Rate: 16 breaths/min, Blood Pressure: 158/68 mmHg. Respiratory normal breathing without difficulty. Psychiatric this patient is able to make decisions and demonstrates good insight into disease process. Alert and Oriented x 3. pleasant and cooperative. General Notes: Patient's wound again did have several pieces of mesh which were sticking out and filaments around the wound bed. I will see this is preventing any epithelialization in closing. Fortunately he is not having significant pain and I was able to trim these protruding filaments which will hopefully help the wound. With that being said it's questionable whether this is going to be something that completely healed over or not. Nonetheless we will still attempt treatment as we can. I did also trim the rolled edge between the one and 4 o'clock location which will hopefully help with granulation. Integumentary (Hair, Skin) Wound #2 status is Open. Original cause of wound was Gradually Appeared. The wound is located on the Left Abdomen - midline. The wound measures 4.8cm length x 0.4cm width x 0.1cm depth; 1.508cm^2 area and 0.151cm^3 volume. The wound is limited to skin breakdown. There is no tunneling or undermining noted. There is a medium amount of purulent drainage noted. The wound margin is flat and intact. There is medium (34-66%) red granulation within the wound bed. There is a medium (34-66%) amount of necrotic tissue within the wound bed including Eschar and Adherent Slough. The  periwound skin appearance exhibited: Induration, Scarring, Ecchymosis. The periwound skin appearance did not exhibit: Callus, Crepitus, Excoriation, Rash, Dry/Scaly, Maceration, Atrophie Blanche, Cyanosis, Hemosiderin Staining, Mottled, Pallor, Rubor, Erythema. Periwound temperature was noted as No Abnormality. Assessment Active Problems ICD-10 E11.622 - Type 2 diabetes mellitus with other skin ulcer S31.100A - Unspecified open wound of abdominal wall, right upper quadrant without penetration into peritoneal cavity, initial encounter S31.102A - Unspecified open wound of abdominal wall, epigastric region without penetration into peritoneal cavity, initial encounter F17.228 - Nicotine dependence, chewing tobacco, with other nicotine-induced disorders Procedures Wound #2 Pre-procedure diagnosis of Wound #2 is an Atypical located on the Left Abdomen - midline . There was a Skin/Subcutaneous Tissue Debridement ([id number]) debridement with total area of 0.6 sq cm performed by STONE III, HOYT E., PA-C. with the following instrument(s): Forceps and Scissors to remove Viable and Non-Viable tissue/material including Other, Skin, and Subcutaneous after achieving pain control using Lidocaine 4% Topical Solution. A time out was conducted at 10:47, prior to the start of the procedure. A Minimum amount of bleeding was controlled with Pressure. The procedure was tolerated well with a pain level of 0 throughout and a pain level of 0 following the procedure. Post Debridement Measurements: 4.8cm length x 0.4cm width x 0.1cm depth; 0.151cm^3 volume. [name], [name] ([id number]) Character of Wound/Ulcer Post Debridement is improved. Post procedure Diagnosis Wound #2: Same as Pre-Procedure Plan Wound Cleansing: Wound #2 Left Abdomen - midline: Clean wound with Normal Saline. May Shower, gently pat wound dry prior to applying new dressing. Skin Barriers/Peri-Wound Care: Wound #2 Left Abdomen -  midline: Skin Prep Primary Wound Dressing: Wound #2 Left Abdomen - midline: Silvercel Non-Adherent - in clinic Other: - PolyMem Silver Secondary Dressing: Wound #2 Left Abdomen - midline: Other - telfa island Dressing Change Frequency: Wound #2 Left Abdomen - midline: Change dressing  every other day. Follow-up Appointments: Wound #2 Left Abdomen - midline: Return Appointment in 1 month I'm gonna recommend that we continue with the current dressings which does seem to be doing well and least as well as can be expected. We will see him for reevaluation in one months time per the previous schedule as this is more of a palliative thing right now and we are unsure if this is really going to close obviously he understands this is a very difficult situation and nobody has recommended any surgery for him to correct the issue. We will see were things stand in one month. If anything worsens in the interim he will contact our office for additional recommendations. Electronic Signature(s) Signed: 04/07/2017 12:23:43 AM By: Worthy Keeler PA-C Entered By: Worthy Keeler on 04/06/2017 17:29:17 [name] ([id number]) -------------------------------------------------------------------------------- ROS/PFSH Details Patient Name: [name] Date of Service: 04/06/2017 10:15 AM Medical Record Number: [id number] Patient Account Number: [id number] Date of Birth/Sex: 1945-11-07 (72 y.o. Male) Treating RN: Montey Hora Primary Care Provider: Thomes Lolling Other Clinician: Referring Provider: Thomes Lolling Treating Provider/Extender: Melburn Hake, HOYT Weeks in Treatment: 51 Information Obtained From Patient Wound History Do you currently have one or more open woundso Yes How many open wounds do you currently haveo 2 Approximately how long have you had your woundso more than 1 year How have you been treating your wound(s) until nowo open to air Has your wound(s) ever healed and then  re-openedo No Have you had any lab work done in the past montho No Have you tested positive for an antibiotic resistant organism (MRSA, VRE)o No Have you tested positive for osteomyelitis (bone infection)o No Have you had any tests for circulation on your legso No Constitutional Symptoms (General Health) Complaints and Symptoms: Negative for: Fever; Chills Eyes Medical History: Positive for: Cataracts - removed Hematologic/Lymphatic Medical History: Negative for: Anemia; Hemophilia; Human Immunodeficiency Virus; Lymphedema; Sickle Cell Disease Respiratory Complaints and Symptoms: No Complaints or Symptoms Medical History: Positive for: Chronic Obstructive Pulmonary Disease (COPD) Negative for: Aspiration; Asthma; Pneumothorax; Sleep Apnea; Tuberculosis Cardiovascular Complaints and Symptoms: No Complaints or Symptoms Medical History: Positive for: Hypertension Negative for: Angina; Arrhythmia; Congestive Heart Failure; Coronary Artery Disease; Deep Vein Thrombosis; Hypotension; Myocardial Infarction; Peripheral Arterial Disease; Peripheral Venous Disease; Phlebitis; Vasculitis Gastrointestinal [name], [name] ([id number]) Medical History: Negative for: Cirrhosis ; Colitis; Crohnos; Hepatitis A; Hepatitis B; Hepatitis C Endocrine Medical History: Positive for: Type II Diabetes Treated with: Oral agents Blood sugar tested every day: No Genitourinary Medical History: Negative for: End Stage Renal Disease Immunological Medical History: Negative for: Lupus Erythematosus; Raynaudos; Scleroderma Integumentary (Skin) Medical History: Negative for: History of Burn; History of pressure wounds Musculoskeletal Medical History: Positive for: Osteoarthritis Negative for: Gout; Rheumatoid Arthritis; Osteomyelitis Neurologic Medical History: Positive for: Neuropathy Negative for: Dementia; Quadriplegia; Paraplegia; Seizure Disorder Oncologic Medical History: Negative for:  Received Chemotherapy; Received Radiation Past Medical History Notes: 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 Psychiatric Complaints and Symptoms: No Complaints or Symptoms Medical History: Negative for: Anorexia/bulimia; Confinement Anxiety HBO Extended History Items Eyes: Cataracts Immunizations Pneumococcal Vaccine: [name], [name] ([id number]) Received Pneumococcal Vaccination: Yes Immunization Notes: up to date Implantable Devices Family and Social History Former smoker - chew tobacco; Marital Status - Widowed; Alcohol Use: Never; Drug Use: No History; Caffeine Use: Daily; Financial Concerns: No; Food, Clothing or Shelter Needs: No; Support System Lacking: No; Transportation Concerns: No; Advanced Directives: No; Patient does not want information on Advanced Directives Physician  Affirmation I have reviewed and agree with the above information. Electronic Signature(s) Signed: 04/07/2017 12:23:43 AM By: Worthy Keeler PA-C Signed: 04/09/2017 5:01:42 PM By: Montey Hora Entered By: Worthy Keeler on 04/06/2017 17:26:41 [name] ([id number]) -------------------------------------------------------------------------------- SuperBill Details Patient Name: TYGER, WICHMAN Date of Service: 04/06/2017 Medical Record Number: [id number] Patient Account Number: [id number] Date of Birth/Sex: Oct 08, 1945 (72 y.o. Male) Treating RN: Montey Hora Primary Care Provider: Thomes Lolling Other Clinician: Referring Provider: Thomes Lolling Treating Provider/Extender: Melburn Hake, HOYT Weeks in Treatment: 39 Diagnosis Coding ICD-10 Codes Code Description E11.622 Type 2 diabetes mellitus with other skin ulcer Unspecified open wound of abdominal wall, right upper quadrant without penetration into peritoneal S31.100A cavity, initial encounter Unspecified open wound of abdominal wall, epigastric region without penetration into peritoneal cavity, S31.102A initial encounter F17.228  Nicotine dependence, chewing tobacco, with other nicotine-induced disorders Facility Procedures CPT4: Description Modifier Quantity Code 53614431 11042 - DEB SUBQ TISSUE 20 SQ CM/< 1 ICD-10 Diagnosis Description S31.102A Unspecified open wound of abdominal wall, epigastric region without penetration into peritoneal cavity, initial encounter Physician Procedures CPT4: Description Modifier Quantity Code [id number] 61950 - WC PHYS SUBQ TISS 20 SQ CM 1 ICD-10 Diagnosis Description S31.102A Unspecified open wound of abdominal wall, epigastric region without penetration into peritoneal cavity, initial encounter Electronic Signature(s) Signed: 04/07/2017 12:23:43 AM By: Worthy Keeler PA-C Entered By: Worthy Keeler on 04/06/2017 17:29:34

## 2017-04-25 MED ORDER — OXYCODONE-ACETAMINOPHEN 5 MG-325 MG TABLET
ORAL_TABLET | ORAL | 0 refills | 0.00000 days | Status: CP | PRN
Start: 2017-04-25 — End: 2017-05-25

## 2017-04-30 MED ORDER — HYDROCODONE 5 MG-ACETAMINOPHEN 325 MG TABLET
ORAL_TABLET | Freq: Four times a day (QID) | ORAL | 0 refills | 0 days | Status: CP | PRN
Start: 2017-04-30 — End: 2017-04-05

## 2017-05-04 ENCOUNTER — Encounter: Payer: Medicare HMO | Attending: Physician Assistant | Admitting: Physician Assistant

## 2017-05-04 DIAGNOSIS — Z86010 Personal history of colonic polyps: Secondary | ICD-10-CM | POA: Insufficient documentation

## 2017-05-04 DIAGNOSIS — G8929 Other chronic pain: Secondary | ICD-10-CM | POA: Insufficient documentation

## 2017-05-04 DIAGNOSIS — J449 Chronic obstructive pulmonary disease, unspecified: Secondary | ICD-10-CM | POA: Diagnosis not present

## 2017-05-04 DIAGNOSIS — S31100A Unspecified open wound of abdominal wall, right upper quadrant without penetration into peritoneal cavity, initial encounter: Secondary | ICD-10-CM | POA: Diagnosis not present

## 2017-05-04 DIAGNOSIS — Y838 Other surgical procedures as the cause of abnormal reaction of the patient, or of later complication, without mention of misadventure at the time of the procedure: Secondary | ICD-10-CM | POA: Insufficient documentation

## 2017-05-04 DIAGNOSIS — I10 Essential (primary) hypertension: Secondary | ICD-10-CM | POA: Diagnosis not present

## 2017-05-04 DIAGNOSIS — Z87891 Personal history of nicotine dependence: Secondary | ICD-10-CM | POA: Insufficient documentation

## 2017-05-04 DIAGNOSIS — E11622 Type 2 diabetes mellitus with other skin ulcer: Secondary | ICD-10-CM | POA: Diagnosis not present

## 2017-05-04 DIAGNOSIS — E1142 Type 2 diabetes mellitus with diabetic polyneuropathy: Secondary | ICD-10-CM | POA: Insufficient documentation

## 2017-05-04 DIAGNOSIS — S31102A Unspecified open wound of abdominal wall, epigastric region without penetration into peritoneal cavity, initial encounter: Secondary | ICD-10-CM | POA: Diagnosis not present

## 2017-05-04 DIAGNOSIS — Z7984 Long term (current) use of oral hypoglycemic drugs: Secondary | ICD-10-CM | POA: Diagnosis not present

## 2017-05-08 NOTE — Progress Notes (Signed)
[name], [name] ([id number]) Visit Report for 05/04/2017 Chief Complaint Document Details Patient Name: [name], [name]. Date of Service: 05/04/2017 10:15 AM Medical Record Number: [id number] Patient Account Number: [id number] Date of Birth/Sex: Jun 23, 1945 (72 y.o. Male) Treating RN: Montey Hora Primary Care Provider: Thomes Lolling Other Clinician: Referring Provider: Thomes Lolling Treating Provider/Extender: Sharalyn Ink in Treatment: 38 Information Obtained from: Patient Chief Complaint Patients presents for treatment of an open diabetic ulcer to the anterior abdominal wall in the epigastric and right upper quadrant Electronic Signature(s) Signed: 05/05/2017 12:37:14 PM By: Worthy Keeler PA-C Entered By: Worthy Keeler on 05/04/2017 10:22:10 [name] ([id number]) -------------------------------------------------------------------------------- Debridement Details Patient Name: [name] Date of Service: 05/04/2017 10:15 AM Medical Record Number: [id number] Patient Account Number: [id number] Date of Birth/Sex: 1945-09-19 (72 y.o. Male) Treating RN: Montey Hora Primary Care Provider: Thomes Lolling Other Clinician: Referring Provider: Thomes Lolling Treating Provider/Extender: Melburn Hake, HOYT Weeks in Treatment: 43 Debridement Performed for Wound #2 Left Abdomen - midline Assessment: Performed By: Physician STONE III, HOYT E., PA-C Debridement: Debridement Pre-procedure Verification/Time Yes - 11:00 Out Taken: Start Time: 11:00 Pain Control: Lidocaine 4% Topical Solution Level: Skin/Subcutaneous Tissue Total Area Debrided (L x W): 1 (cm) x 1 (cm) = 1 (cm) Tissue and other material Viable, Non-Viable, Other, Subcutaneous debrided: Instrument: Forceps, Scissors Bleeding: Minimum Hemostasis Achieved: Pressure End Time: 11:05 Procedural Pain: 0 Post Procedural Pain: 0 Response to Treatment: Procedure was tolerated well Post Debridement  Measurements of Total Wound Length: (cm) 4.6 Width: (cm) 0.4 Depth: (cm) 0.2 Volume: (cm) 0.289 Character of Wound/Ulcer Post Debridement: Improved Post Procedure Diagnosis Same as Pre-procedure Electronic Signature(s) Signed: 05/04/2017 5:21:02 PM By: Montey Hora Signed: 05/05/2017 12:37:14 PM By: Worthy Keeler PA-C Entered By: Montey Hora on 05/04/2017 11:05:27 [name] ([id number]) -------------------------------------------------------------------------------- HPI Details Patient Name: [name] Date of Service: 05/04/2017 10:15 AM Medical Record Number: [id number] Patient Account Number: [id number] Date of Birth/Sex: [birth year] (72 y.o. Male) Treating RN: Montey Hora Primary Care Provider: Thomes Lolling Other Clinician: Referring Provider: Thomes Lolling Treating Provider/Extender: Sharalyn Ink in Treatment: 6 History of Present Illness HPI Description: 72 year old gentleman has been seen recently by his PCP Dr. Thomes Lolling, who sees him with a history of diabetes mellitus, peripheral neuropathy, B12 deficiency, hypertension, osteoarthritis and the patient had recent blood sugar checked which was 163. Past medical history includes essential hypertension, incisional hernia, diabetes mellitus, colon polyps, COPD, tobacco abuse in the past, chewing tobacco, GERD. He has also been treated for chronic back pain with no sciatica and is on oxycodone. His last hemoglobin A1c was 6.9% His abdominal surgery was done at [facility] over 15 years ago and gradually there was sutures which protruded and then there was mesh which protruded. He washes this during his shower but does not put any dressing and the wound is covered with a lot of debris. He has never had a surgical consultation for this. 07/10/2016 -- the patient has spoken to his PCP who said he was going to call me but I have not yet had a phone conversation with the physician. From what I  understand the physician was reluctant to have a surgical opinion because of the patient's several comorbidities and he feels that the surgery may be detrimental to his overall health 07/24/2016 -- the patient's daughter is at the bedside and we've had a detailed discussion regarding his options for a surgical opinion and possible surgical intervention before this becomes  a emergent situation. She will seek primary care input and take the father to [facility] for a surgical opinion. 07/31/2016 -- the patient's daughter has organized for a surgical opinion at [facility] on June 14 08/14/2016 -- it has been about 3 weeks since he has stopped chewing tobacco. 08/28/2016 -- he was seen at [facility] by Dr. Roby Lofts -- the assessment was based on physical examination and review of his previous history of surgical repair. the patient had undergone a ventral hernia repair in 1998 with Dr. Quay Burow at [facility] at a prior open cholecystectomy incision. he was evaluated by Dr. Denice Paradise in 2013 and at that time it was decided to watch and wait. Dr. Payton Doughty recommended a repeat CT scan and requested records from dura and regional operative records and would follow-up the patient, after Mr. Melland PCP Dr. Gwenlyn Saran reviewed him for overall fitness for surgery. 09/04/16 patient's wound actually appear to be doing well on evaluation today. He is not really experience any discomfort and the right abdominal wound is dry and almost appears to be healing up. He continues to have some drainage from the left but this seems to be more of a potential seroma which with milking is able to be cleared out. I discussed with patient that when he performs his dressing changes at home it would be beneficial for him to do this as well. Otherwise there's no evidence of infection. He is still waiting on his CT scan and appointments to determine whether he is going to have another surgery. 09/11/2016 -- the patient has had  a CT scan and is awaiting his surgical appointment prior to deciding on any surgical intervention. 09/25/16 patient presents today for fault evaluation concerning his ongoing abdominal surgical wounds. Unfortunately they do not appear to be improving significantly but rather fluctuate from a little better to little worse week by week. Obviously this is somewhat frustrating for him. 10/02/2016 -- the patient has completed his CT scan, and has a review by the surgeons at the end of August. He has a new area which has opened up a little below his left abdominal wound. 10/16/16 on evaluation today patient's wounds on the abdominal region appears to be doing about the same. He actually has an appointment with his surgeon on the 21st for evaluation regarding the required products appointment. Obviously he is having a difficult time with the feeling of these wounds. Fortunately there does not appear to be any evidence of local infection such as significant amount of erythema or streaking. She also has an audio, vomiting, or diarrhea. He is not have any discomfort at this point. KOA, ZOELLER ([id number]) 10/30/2016 -- the surgical review is tomorrow and I am awaiting the input of the surgeon. 11/16/2016 -- I had a call from his surgeon Dr. Payton Doughty, on phone number [phone] who was kind enough to call me regarding his care. After a thorough review and having consulted the abdominal wall reconstruction team they have decided that surgery will be too extensive and rather moribund for this gentleman and have asked him to continue with local care at the wound center. She will send me an official note via Epic. 03/09/2017 -- the patient comes for some abdominal wall wounds which were caused by protruding mesh and complete surgical review was done at [facility] and no surgery has been recommended. He continues to get palliative care and the right abdominal wound is completely healed. He has a few open  spots on the left which are being treated with palliative care and we see him once a month 05/04/17 on evaluation today Mr. Bernards presents for follow-up concerning his abdominal surgical ulcer. He does have some additional mashed taking out of the wound at this point although he has made a lot of progress since last time I saw him. There is good epithelialization in a lot of areas although there is still mesh poking through which is preventing complete closure. Fortunately there does not appear to be any evidence of infection. Electronic Signature(s) Signed: 05/05/2017 12:37:14 PM By: Worthy Keeler PA-C Entered By: Worthy Keeler on 05/05/2017 11:27:49 [name] ([id number]) -------------------------------------------------------------------------------- Physical Exam Details Patient Name: [name], [name] Date of Service: 05/04/2017 10:15 AM Medical Record Number: [id number] Patient Account Number: [id number] Date of Birth/Sex: 11-02-1945 (72 y.o. Male) Treating RN: Montey Hora Primary Care Provider: Thomes Lolling Other Clinician: Referring Provider: Thomes Lolling Treating Provider/Extender: Melburn Hake, HOYT Weeks in Treatment: 72 Constitutional Well-nourished and well-hydrated in no acute distress. Respiratory normal breathing without difficulty. Psychiatric this patient is able to make decisions and demonstrates good insight into disease process. Alert and Oriented x 3. pleasant and cooperative. Notes In regard to patients wound I was able today to debride the wound include cleaning out the mesh which was protruding through in preventing complete closure of the wound. Patient tolerated this without complication. Post debridement the wound appear to be doing much better and there was no bash continuing to poke through. Electronic Signature(s) Signed: 05/05/2017 12:37:14 PM By: Worthy Keeler PA-C Entered By: Worthy Keeler on 05/05/2017 11:28:46 [name]  ([id number]) -------------------------------------------------------------------------------- Physician Orders Details Patient Name: RANJIT, ASHURST Date of Service: 05/04/2017 10:15 AM Medical Record Number: [id number] Patient Account Number: [id number] Date of Birth/Sex: Jul 19, 1945 (72 y.o. Male) Treating RN: Montey Hora Primary Care Provider: Thomes Lolling Other Clinician: Referring Provider: Thomes Lolling Treating Provider/Extender: Sharalyn Ink in Treatment: 101 Verbal / Phone Orders: No Diagnosis Coding ICD-10 Coding Code Description E11.622 Type 2 diabetes mellitus with other skin ulcer Unspecified open wound of abdominal wall, right upper quadrant without penetration into peritoneal S31.100A cavity, initial encounter Unspecified open wound of abdominal wall, epigastric region without penetration into peritoneal cavity, S31.102A initial encounter F17.228 Nicotine dependence, chewing tobacco, with other nicotine-induced disorders Wound Cleansing Wound #2 Left Abdomen - midline o Clean wound with Normal Saline. o May Shower, gently pat wound dry prior to applying new dressing. Skin Barriers/Peri-Wound Care Wound #2 Left Abdomen - midline o Skin Prep Primary Wound Dressing Wound #2 Left Abdomen - midline o Silvercel Non-Adherent - in clinic o Other: - PolyMem Silver Secondary Dressing Wound #2 Left Abdomen - midline o Other - telfa island Dressing Change Frequency Wound #2 Left Abdomen - midline o Change dressing every other day. Follow-up Appointments Wound #2 Left Abdomen - midline o Return Appointment in 2 weeks. Electronic Signature(s) Signed: 05/04/2017 5:21:02 PM By: Montey Hora Signed: 05/05/2017 12:37:14 PM By: Worthy Keeler PA-C Entered By: Montey Hora on 05/04/2017 11:05:52 [name], [name] ([id number], LEVERICH ([id number]) -------------------------------------------------------------------------------- Problem List  Details Patient Name: DEMONTREZ, RINDFLEISCH Date of Service: 05/04/2017 10:15 AM Medical Record Number: [id number] Patient Account Number: [id number] Date of Birth/Sex: 12/13/1945 (72 y.o. Male) Treating RN: Montey Hora Primary Care Provider: Thomes Lolling Other Clinician: Referring Provider: Thomes Lolling Treating Provider/Extender: Worthy Keeler Weeks in Treatment: 74 Active Problems ICD-10 Encounter Code Description Active Date Diagnosis E11.622 Type 2 diabetes  mellitus with other skin ulcer 07/03/2016 Yes S31.100A Unspecified open wound of abdominal wall, right upper quadrant 07/03/2016 Yes without penetration into peritoneal cavity, initial encounter S31.102A Unspecified open wound of abdominal wall, epigastric region 07/03/2016 Yes without penetration into peritoneal cavity, initial encounter F17.228 Nicotine dependence, chewing tobacco, with other nicotine-induced 07/03/2016 Yes disorders Inactive Problems Resolved Problems Electronic Signature(s) Signed: 05/05/2017 12:37:14 PM By: Worthy Keeler PA-C Entered By: Worthy Keeler on 05/04/2017 10:22:01 [name] ([id number]) -------------------------------------------------------------------------------- Progress Note Details Patient Name: [name] Date of Service: 05/04/2017 10:15 AM Medical Record Number: [id number] Patient Account Number: [id number] Date of Birth/Sex: 12/01/1945 (72 y.o. Male) Treating RN: Montey Hora Primary Care Provider: Thomes Lolling Other Clinician: Referring Provider: Thomes Lolling Treating Provider/Extender: Sharalyn Ink in Treatment: 86 Subjective Chief Complaint Information obtained from Patient Patients presents for treatment of an open diabetic ulcer to the anterior abdominal wall in the epigastric and right upper quadrant History of Present Illness (HPI) 72 year old gentleman has been seen recently by his PCP Dr. Thomes Lolling, who sees him with a history of  diabetes mellitus, peripheral neuropathy, B12 deficiency, hypertension, osteoarthritis and the patient had recent blood sugar checked which was 163. Past medical history includes essential hypertension, incisional hernia, diabetes mellitus, colon polyps, COPD, tobacco abuse in the past, chewing tobacco, GERD. He has also been treated for chronic back pain with no sciatica and is on oxycodone. His last hemoglobin A1c was 6.9% His abdominal surgery was done at [facility] over 15 years ago and gradually there was sutures which protruded and then there was mesh which protruded. He washes this during his shower but does not put any dressing and the wound is covered with a lot of debris. He has never had a surgical consultation for this. 07/10/2016 -- the patient has spoken to his PCP who said he was going to call me but I have not yet had a phone conversation with the physician. From what I understand the physician was reluctant to have a surgical opinion because of the patient's several comorbidities and he feels that the surgery may be detrimental to his overall health 07/24/2016 -- the patient's daughter is at the bedside and we've had a detailed discussion regarding his options for a surgical opinion and possible surgical intervention before this becomes a emergent situation. She will seek primary care input and take the father to [facility] for a surgical opinion. 07/31/2016 -- the patient's daughter has organized for a surgical opinion at [facility] on June 14 08/14/2016 -- it has been about 3 weeks since he has stopped chewing tobacco. 08/28/2016 -- he was seen at [facility] by Dr. Roby Lofts -- the assessment was based on physical examination and review of his previous history of surgical repair. the patient had undergone a ventral hernia repair in 1998 with Dr. Quay Burow at [facility] at a prior open cholecystectomy incision. he was evaluated by Dr. Denice Paradise in 2013 and at that time it  was decided to watch and wait. Dr. Payton Doughty recommended a repeat CT scan and requested records from dura and regional operative records and would follow-up the patient, after Mr. Wiltsey PCP Dr. Gwenlyn Saran reviewed him for overall fitness for surgery. 09/04/16 patient's wound actually appear to be doing well on evaluation today. He is not really experience any discomfort and the right abdominal wound is dry and almost appears to be healing up. He continues to have some drainage from the left but this seems  to be more of a potential seroma which with milking is able to be cleared out. I discussed with patient that when he performs his dressing changes at home it would be beneficial for him to do this as well. Otherwise there's no evidence of infection. He is still waiting on his CT scan and appointments to determine whether he is going to have another surgery. 09/11/2016 -- the patient has had a CT scan and is awaiting his surgical appointment prior to deciding on any surgical intervention. 09/25/16 patient presents today for fault evaluation concerning his ongoing abdominal surgical wounds. Unfortunately they do not appear to be improving significantly but rather fluctuate from a little better to little worse week by week. Obviously this is somewhat frustrating for him. 10/02/2016 -- the patient has completed his CT scan, and has a review by the surgeons at the end of August. He has a new [name], [name]. ([id number]) area which has opened up a little below his left abdominal wound. 10/16/16 on evaluation today patient's wounds on the abdominal region appears to be doing about the same. He actually has an appointment with his surgeon on the 21st for evaluation regarding the required products appointment. Obviously he is having a difficult time with the feeling of these wounds. Fortunately there does not appear to be any evidence of local infection such as significant amount of erythema or streaking. She  also has an audio, vomiting, or diarrhea. He is not have any discomfort at this point. 10/30/2016 -- the surgical review is tomorrow and I am awaiting the input of the surgeon. 11/16/2016 -- I had a call from his surgeon Dr. Payton Doughty, on phone number [phone] who was kind enough to call me regarding his care. After a thorough review and having consulted the abdominal wall reconstruction team they have decided that surgery will be too extensive and rather moribund for this gentleman and have asked him to continue with local care at the wound center. She will send me an official note via Epic. 03/09/2017 -- the patient comes for some abdominal wall wounds which were caused by protruding mesh and complete surgical review was done at [facility] and no surgery has been recommended. He continues to get palliative care and the right abdominal wound is completely healed. He has a few open spots on the left which are being treated with palliative care and we see him once a month 05/04/17 on evaluation today Mr. Kuba presents for follow-up concerning his abdominal surgical ulcer. He does have some additional mashed taking out of the wound at this point although he has made a lot of progress since last time I saw him. There is good epithelialization in a lot of areas although there is still mesh poking through which is preventing complete closure. Fortunately there does not appear to be any evidence of infection. Patient History Information obtained from Patient. Social History Former smoker - chew tobacco, Marital Status - Widowed, Alcohol Use - Never, Drug Use - No History, Caffeine Use - Daily. 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 Respiratory The patient has no complaints or symptoms. Cardiovascular The patient has no  complaints or symptoms. Psychiatric The patient has no complaints or symptoms. Objective Constitutional Well-nourished and well-hydrated in no acute distress. [name], [name] ([id number]) Vitals Time Taken: 10:41 AM, Height: 65 in, Weight: 161 lbs, BMI:  26.8, Temperature: 98.1 F, Pulse: 95 bpm, Respiratory Rate: 16 breaths/min, Blood Pressure: 125/62 mmHg. Respiratory normal breathing without difficulty. Psychiatric this patient is able to make decisions and demonstrates good insight into disease process. Alert and Oriented x 3. pleasant and cooperative. General Notes: In regard to patients wound I was able today to debride the wound include cleaning out the mesh which was protruding through in preventing complete closure of the wound. Patient tolerated this without complication. Post debridement the wound appear to be doing much better and there was no bash continuing to poke through. Integumentary (Hair, Skin) Wound #2 status is Open. Original cause of wound was Gradually Appeared. The wound is located on the Left Abdomen - midline. The wound measures 4.6cm length x 0.4cm width x 0.1cm depth; 1.445cm^2 area and 0.145cm^3 volume. The wound is limited to skin breakdown. There is no tunneling or undermining noted. There is a medium amount of purulent drainage noted. The wound margin is flat and intact. There is medium (34-66%) red granulation within the wound bed. There is a medium (34-66%) amount of necrotic tissue within the wound bed including Eschar and Adherent Slough. The periwound skin appearance exhibited: Induration, Scarring, Ecchymosis. The periwound skin appearance did not exhibit: Callus, Crepitus, Excoriation, Rash, Dry/Scaly, Maceration, Atrophie Blanche, Cyanosis, Hemosiderin Staining, Mottled, Pallor, Rubor, Erythema. Periwound temperature was noted as No Abnormality. Assessment Active Problems ICD-10 E11.622 - Type 2 diabetes mellitus with other skin ulcer S31.100A -  Unspecified open wound of abdominal wall, right upper quadrant without penetration into peritoneal cavity, initial encounter S31.102A - Unspecified open wound of abdominal wall, epigastric region without penetration into peritoneal cavity, initial encounter F17.228 - Nicotine dependence, chewing tobacco, with other nicotine-induced disorders Procedures Wound #2 Pre-procedure diagnosis of Wound #2 is an Atypical located on the Left Abdomen - midline . There was a Skin/Subcutaneous Tissue Debridement ([id number]) debridement with total area of 1 sq cm performed by STONE III, HOYT E., PA-C. with the following instrument(s): Forceps and Scissors to remove Viable and Non-Viable tissue/material including Other and Subcutaneous after achieving pain control using Lidocaine 4% Topical Solution. A time out was conducted at 11:00, prior to the start of the procedure. A Minimum amount of bleeding was controlled with Pressure. The procedure was tolerated well with a pain level of 0 throughout and a pain level of 0 following the procedure. Post Debridement Measurements: 4.6cm length x 0.4cm width x 0.2cm depth; 0.289cm^3 volume. Character of Wound/Ulcer Post Debridement is improved. Post procedure Diagnosis Wound #2: Same as Pre-Procedure [name], [name] ([id number]) Plan Wound Cleansing: Wound #2 Left Abdomen - midline: Clean wound with Normal Saline. May Shower, gently pat wound dry prior to applying new dressing. Skin Barriers/Peri-Wound Care: Wound #2 Left Abdomen - midline: Skin Prep Primary Wound Dressing: Wound #2 Left Abdomen - midline: Silvercel Non-Adherent - in clinic Other: - PolyMem Silver Secondary Dressing: Wound #2 Left Abdomen - midline: Other - telfa island Dressing Change Frequency: Wound #2 Left Abdomen - midline: Change dressing every other day. Follow-up Appointments: Wound #2 Left Abdomen - midline: Return Appointment in 2 weeks. I am going to suggest that we continue  with the Current wound care measures for the next two weeks. I do want to see him back in two weeks time so that if need be we can at that point hopefully clean off more the best that may be coming through to allow this to hopefully progress to closure. Patient is in agreement with the plan. We will see  were things stand again in two weeks time. Please see above for specific wound care orders. We will see patient for re-evaluation in 1 week(s) here in the clinic. If anything worsens or changes patient will contact our office for additional recommendations. Electronic Signature(s) Signed: 05/05/2017 12:37:14 PM By: Worthy Keeler PA-C Entered By: Worthy Keeler on 05/05/2017 11:29:35 [name], [name] ([id number]) -------------------------------------------------------------------------------- ROS/PFSH Details Patient Name: [name] Date of Service: 05/04/2017 10:15 AM Medical Record Number: [id number] Patient Account Number: [id number] Date of Birth/Sex: 01-Dec-1945 (72 y.o. Male) Treating RN: Montey Hora Primary Care Provider: Thomes Lolling Other Clinician: Referring Provider: Thomes Lolling Treating Provider/Extender: Melburn Hake, HOYT Weeks in Treatment: 20 Information Obtained From Patient Wound History Do you currently have one or more open woundso Yes How many open wounds do you currently haveo 2 Approximately how long have you had your woundso more than 1 year How have you been treating your wound(s) until nowo open to air Has your wound(s) ever healed and then re-openedo No Have you had any lab work done in the past montho No Have you tested positive for an antibiotic resistant organism (MRSA, VRE)o No Have you tested positive for osteomyelitis (bone infection)o No Have you had any tests for circulation on your legso No Constitutional Symptoms (General Health) Complaints and Symptoms: Negative for: Fever; Chills Eyes Medical History: Positive for: Cataracts -  removed Hematologic/Lymphatic Medical History: Negative for: Anemia; Hemophilia; Human Immunodeficiency Virus; Lymphedema; Sickle Cell Disease Respiratory Complaints and Symptoms: No Complaints or Symptoms Medical History: Positive for: Chronic Obstructive Pulmonary Disease (COPD) Negative for: Aspiration; Asthma; Pneumothorax; Sleep Apnea; Tuberculosis Cardiovascular Complaints and Symptoms: No Complaints or Symptoms Medical History: Positive for: Hypertension Negative for: Angina; Arrhythmia; Congestive Heart Failure; Coronary Artery Disease; Deep Vein Thrombosis; Hypotension; Myocardial Infarction; Peripheral Arterial Disease; Peripheral Venous Disease; Phlebitis; Vasculitis Gastrointestinal [name], [name] ([id number]) Medical History: Negative for: Cirrhosis ; Colitis; Crohnos; Hepatitis A; Hepatitis B; Hepatitis C Endocrine Medical History: Positive for: Type II Diabetes Treated with: Oral agents Blood sugar tested every day: No Genitourinary Medical History: Negative for: End Stage Renal Disease Immunological Medical History: Negative for: Lupus Erythematosus; Raynaudos; Scleroderma Integumentary (Skin) Medical History: Negative for: History of Burn; History of pressure wounds Musculoskeletal Medical History: Positive for: Osteoarthritis Negative for: Gout; Rheumatoid Arthritis; Osteomyelitis Neurologic Medical History: Positive for: Neuropathy Negative for: Dementia; Quadriplegia; Paraplegia; Seizure Disorder Oncologic Medical History: Negative for: Received Chemotherapy; Received Radiation Past Medical History Notes: 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 Psychiatric Complaints and Symptoms: No Complaints or Symptoms Medical History: Negative for: Anorexia/bulimia; Confinement Anxiety HBO Extended History Items Eyes: Cataracts Immunizations Pneumococcal Vaccine: [name], [name] ([id number]) Received Pneumococcal Vaccination: Yes Immunization  Notes: up to date Implantable Devices Family and Social History Former smoker - chew tobacco; Marital Status - Widowed; Alcohol Use: Never; Drug Use: No History; Caffeine Use: Daily; Financial Concerns: No; Food, Clothing or Shelter Needs: No; Support System Lacking: No; Transportation Concerns: No; Advanced Directives: No; Patient does not want information on Advanced Directives Physician Affirmation I have reviewed and agree with the above information. Electronic Signature(s) Signed: 05/05/2017 12:37:14 PM By: Worthy Keeler PA-C Signed: 05/07/2017 4:35:46 PM By: Montey Hora Entered By: Worthy Keeler on 05/05/2017 11:28:32 [name], [name] ([id number]) -------------------------------------------------------------------------------- SuperBill Details Patient Name: [name], [name] Date of Service: 05/04/2017 Medical Record Number: [id number] Patient Account Number: [id number] Date of Birth/Sex: 1945/10/24 (72 y.o. Male) Treating RN: Montey Hora Primary Care Provider: Thomes Lolling Other Clinician:  Referring Provider: Thomes Lolling Treating Provider/Extender: Worthy Keeler Weeks in Treatment: 51 Diagnosis Coding ICD-10 Codes Code Description E11.622 Type 2 diabetes mellitus with other skin ulcer Unspecified open wound of abdominal wall, right upper quadrant without penetration into peritoneal S31.100A cavity, initial encounter Unspecified open wound of abdominal wall, epigastric region without penetration into peritoneal cavity, S31.102A initial encounter F17.228 Nicotine dependence, chewing tobacco, with other nicotine-induced disorders Facility Procedures CPT4: Description Modifier Quantity Code 07354301 11042 - DEB SUBQ TISSUE 20 SQ CM/< 1 ICD-10 Diagnosis Description S31.100A Unspecified open wound of abdominal wall, right upper quadrant without penetration into peritoneal cavity, initial encounter Physician Procedures CPT4: Description Modifier Quantity Code 4840397  95369 - WC PHYS SUBQ TISS 20 SQ CM 1 ICD-10 Diagnosis Description S31.100A Unspecified open wound of abdominal wall, right upper quadrant without penetration into peritoneal cavity, initial encounter Electronic Signature(s) Signed: 05/05/2017 12:37:14 PM By: Worthy Keeler PA-C Entered By: Worthy Keeler on 05/05/2017 11:29:51

## 2017-05-10 NOTE — Progress Notes (Signed)
[name], [name] ([id number]) Visit Report for 05/04/2017 Arrival Information Details Patient Name: [name], [name]. Date of Service: 05/04/2017 10:15 AM Medical Record Number: [id number] Patient Account Number: [id number] Date of Birth/Sex: Jul 29, 1945 (72 y.o. Male) Treating RN: Montey Hora Primary Care Amina Menchaca: Thomes Lolling Other Clinician: Referring Bennette Hasty: Thomes Lolling Treating Meka Lewan/Extender: Sharalyn Ink in Treatment: 49 Visit Information History Since Last Visit Added or deleted any medications: No Patient Arrived: Ambulatory Any new allergies or adverse reactions: No Arrival Time: 10:40 Had a fall or experienced change in No Accompanied By: self activities of daily living that may affect Transfer Assistance: None risk of falls: Patient Identification Verified: Yes Signs or symptoms of abuse/neglect since last visito No Secondary Verification Process Completed: Yes Hospitalized since last visit: No Patient Requires Transmission-Based No Has Dressing in Place as Prescribed: Yes Precautions: Pain Present Now: No Patient Has Alerts: Yes Patient Alerts: DMII Electronic Signature(s) Signed: 05/04/2017 5:21:02 PM By: Montey Hora Entered By: Montey Hora on 05/04/2017 10:40:57 [name] ([id number]) -------------------------------------------------------------------------------- Encounter Discharge Information Details Patient Name: [name] Date of Service: 05/04/2017 10:15 AM Medical Record Number: [id number] Patient Account Number: [id number] Date of Birth/Sex: 02/10/1946 (72 y.o. Male) Treating RN: Ahmed Prima Primary Care Jasn Xia: Thomes Lolling Other Clinician: Referring Odelia Graciano: Thomes Lolling Treating Drae Mitzel/Extender: Sharalyn Ink in Treatment: 77 Encounter Discharge Information Items Discharge Pain Level: 0 Discharge Condition: Stable Ambulatory Status: Ambulatory Discharge Destination: Home Transportation: Private  Auto Accompanied By: self Schedule Follow-up Appointment: Yes Medication Reconciliation completed and No provided to Patient/Care Talissa Apple: Provided on Clinical Summary of Care: 05/04/2017 Form Type Recipient Paper Patient FB Electronic Signature(s) Signed: 05/08/2017 8:07:32 AM By: [name] Entered By: [name] on 05/04/2017 11:18:15 [name] ([id number]) -------------------------------------------------------------------------------- Lower Extremity Assessment Details Patient Name: [name] Date of Service: 05/04/2017 10:15 AM Medical Record Number: [id number] Patient Account Number: [id number] Date of Birth/Sex: [birth year] (72 y.o. Male) Treating RN: Montey Hora Primary Care Jazmine Longshore: Thomes Lolling Other Clinician: Referring Baileigh Modisette: Thomes Lolling Treating Laurice Iglesia/Extender: Worthy Keeler Weeks in Treatment: 44 Electronic Signature(s) Signed: 05/04/2017 5:21:02 PM By: Montey Hora Entered By: Montey Hora on 05/04/2017 10:58:33 [name] ([id number]) -------------------------------------------------------------------------------- Multi Wound Chart Details Patient Name: [name] Date of Service: 05/04/2017 10:15 AM Medical Record Number: [id number] Patient Account Number: [id number] Date of Birth/Sex: [birth year] (72 y.o. Male) Treating RN: Montey Hora Primary Care Breahna Boylen: Thomes Lolling Other Clinician: Referring Jhace Fennell: Thomes Lolling Treating Jacarie Pate/Extender: Melburn Hake, HOYT Weeks in Treatment: 43 Vital Signs Height(in): 65 Pulse(bpm): 95 Weight(lbs): 161 Blood Pressure(mmHg): 125/62 Body Mass Index(BMI): 27 Temperature(F): 98.1 Respiratory Rate 16 (breaths/min): Photos: [2:No Photos] [N/A:N/A] Wound Location: [2:Left Abdomen - midline] [N/A:N/A] Wounding Event: [2:Gradually Appeared] [N/A:N/A] Primary Etiology: [2:Atypical] [N/A:N/A] Comorbid History: [2:Cataracts, Chronic Obstructive N/A Pulmonary Disease  (COPD), Hypertension, Type II Diabetes, Osteoarthritis, Neuropathy] Date Acquired: [2:05/17/2015] [N/A:N/A] Weeks of Treatment: [2:43] [N/A:N/A] Wound Status: [2:Open] [N/A:N/A] Clustered Wound: [2:Yes] [N/A:N/A] Clustered Quantity: [2:3] [N/A:N/A] Measurements L x W x D [2:4.6x0.4x0.1] [N/A:N/A] (cm) Area (cm) : [2:1.445] [N/A:N/A] Volume (cm) : [2:0.145] [N/A:N/A] % Reduction in Area: [2:72.60%] [N/A:N/A] % Reduction in Volume: [2:72.50%] [N/A:N/A] Classification: [2:Full Thickness Without Exposed Support Structures] [N/A:N/A] Exudate Amount: [2:Medium] [N/A:N/A] Exudate Type: [2:Purulent] [N/A:N/A] Exudate Color: [2:yellow, Kot, green] [N/A:N/A] Wound Margin: [2:Flat and Intact] [N/A:N/A] Granulation Amount: [2:Medium (34-66%)] [N/A:N/A] Granulation Quality: [2:Red] [N/A:N/A] Necrotic Amount: [2:Medium (34-66%)] [N/A:N/A] Necrotic Tissue: [2:Eschar, Adherent Slough] [N/A:N/A] Exposed Structures: [2:Fascia: No Fat Layer (Subcutaneous Tissue) Exposed: No Tendon: No  Muscle: No Joint: No] [N/A:N/A] Bone: No Limited to Skin Breakdown Epithelialization: Large (67-100%) N/A N/A Periwound Skin Texture: Induration: Yes N/A N/A Scarring: Yes Excoriation: No Callus: No Crepitus: No Rash: No Periwound Skin Moisture: Maceration: No N/A N/A Dry/Scaly: No Periwound Skin Color: Ecchymosis: Yes N/A N/A Atrophie Blanche: No Cyanosis: No Erythema: No Hemosiderin Staining: No Mottled: No Pallor: No Rubor: No Temperature: No Abnormality N/A N/A Tenderness on Palpation: No N/A N/A Wound Preparation: Ulcer Cleansing: N/A N/A Rinsed/Irrigated with Saline Topical Anesthetic Applied: Other: lidocaine 4% Treatment Notes Electronic Signature(s) Signed: 05/04/2017 5:21:02 PM By: Montey Hora Entered By: Montey Hora on 05/04/2017 10:58:45 [name] ([id number]) -------------------------------------------------------------------------------- Mineral Plan  Details Patient Name: [name], [name] Date of Service: 05/04/2017 10:15 AM Medical Record Number: [id number] Patient Account Number: [id number] Date of Birth/Sex: [birth year] (72 y.o. Male) Treating RN: Montey Hora Primary Care Jahsiah Carpenter: Thomes Lolling Other Clinician: Referring Korby Ratay: Thomes Lolling Treating Paulita Licklider/Extender: Melburn Hake, HOYT Weeks in Treatment: 22 Active Inactive ` Abuse / Safety / Falls / Self Care Management Nursing Diagnoses: Impaired physical mobility Goals: Patient will remain injury free Date Initiated: 07/03/2016 Target Resolution Date: 09/14/2016 Goal Status: Active Interventions: Assess fall risk on admission and as needed Notes: ` Orientation to the Wound Care Program Nursing Diagnoses: Knowledge deficit related to the wound healing center program Goals: Patient/caregiver will verbalize understanding of the [facility] Date Initiated: 07/03/2016 Target Resolution Date: 09/15/2016 Goal Status: Active Interventions: Provide education on orientation to the wound center Notes: ` Wound/Skin Impairment Nursing Diagnoses: Impaired tissue integrity Goals: Patient/caregiver will verbalize understanding of skin care regimen Date Initiated: 07/03/2016 Target Resolution Date: 09/15/2016 Goal Status: Active Ulcer/skin breakdown will have a volume reduction of 30% by week 4 Date Initiated: 07/03/2016 Target Resolution Date: 09/15/2016 [name], [name] ([id number]) Goal Status: Active Ulcer/skin breakdown will have a volume reduction of 50% by week 8 Date Initiated: 07/03/2016 Target Resolution Date: 09/15/2016 Goal Status: Active Ulcer/skin breakdown will have a volume reduction of 80% by week 12 Date Initiated: 07/03/2016 Target Resolution Date: 09/15/2016 Goal Status: Active Ulcer/skin breakdown will heal within 14 weeks Date Initiated: 07/03/2016 Target Resolution Date: 09/15/2016 Goal Status: Active Interventions: Assess patient/caregiver  ability to obtain necessary supplies Assess patient/caregiver ability to perform ulcer/skin care regimen upon admission and as needed Assess ulceration(s) every visit Notes: Electronic Signature(s) Signed: 05/04/2017 5:21:02 PM By: Montey Hora Entered By: Montey Hora on 05/04/2017 10:58:37 [name] ([id number]) -------------------------------------------------------------------------------- Pain Assessment Details Patient Name: [name] Date of Service: 05/04/2017 10:15 AM Medical Record Number: [id number] Patient Account Number: [id number] Date of Birth/Sex: 02/04/1946 (72 y.o. Male) Treating RN: Montey Hora Primary Care Eyoel Throgmorton: Thomes Lolling Other Clinician: Referring Keyly Baldonado: Thomes Lolling Treating Elishah Ashmore/Extender: Melburn Hake, HOYT Weeks in Treatment: 54 Active Problems Location of Pain Severity and Description of Pain Patient Has Paino No Site Locations Pain Management and Medication Current Pain Management: Notes Topical or injectable lidocaine is offered to patient for acute pain when surgical debridement is performed. If needed, Patient is instructed to use over the counter pain medication for the following 24-48 hours after debridement. Wound care MDs do not prescribed pain medications. Patient has chronic pain or uncontrolled pain. Patient has been instructed to make an appointment with their Primary Care Physician for pain management. Electronic Signature(s) Signed: 05/04/2017 5:21:02 PM By: Montey Hora Entered By: Montey Hora on 05/04/2017 10:41:07 [name] ([id number]) -------------------------------------------------------------------------------- Patient/Caregiver Education Details Patient Name: [name], [name] Date of Service: 05/04/2017 10:15  AM Medical Record Number: [id number] Patient Account Number: [id number] Date of Birth/Gender: 14-Jul-1945 (72 y.o. Male) Treating RN: Carolyne Fiscal, Debi Primary Care Physician: Thomes Lolling  Other Clinician: Referring Physician: Thomes Lolling Treating Physician/Extender: Sharalyn Ink in Treatment: 80 Education Assessment Education Provided To: Patient Education Topics Provided Wound/Skin Impairment: Handouts: Caring for Your Ulcer, Other: change dressing as ordered Methods: Demonstration, Explain/Verbal Responses: State content correctly Electronic Signature(s) Signed: 05/09/2017 4:30:35 PM By: Alric Quan Entered By: Alric Quan on 05/04/2017 11:15:01 [name] ([id number]) -------------------------------------------------------------------------------- Wound Assessment Details Patient Name: [name] Date of Service: 05/04/2017 10:15 AM Medical Record Number: [id number] Patient Account Number: [id number] Date of Birth/Sex: 10-21-1945 (72 y.o. Male) Treating RN: Montey Hora Primary Care Oluwatimileyin Vivier: Thomes Lolling Other Clinician: Referring Kadeidra Coryell: Thomes Lolling Treating Tinzley Dalia/Extender: Melburn Hake, HOYT Weeks in Treatment: 43 Wound Status Wound Number: 2 Primary Atypical Etiology: Wound Location: Left Abdomen - midline Wound Open Wounding Event: Gradually Appeared Status: Date Acquired: 05/17/2015 Comorbid Cataracts, Chronic Obstructive Pulmonary Weeks Of Treatment: 43 History: Disease (COPD), Hypertension, Type II Clustered Wound: Yes Diabetes, Osteoarthritis, Neuropathy Photos Photo Uploaded By: Montey Hora on 05/04/2017 11:10:58 Wound Measurements Length: (cm) 4.6 Width: (cm) 0.4 Depth: (cm) 0.1 Clustered Quantity: 3 Area: (cm) 1.445 Volume: (cm) 0.145 % Reduction in Area: 72.6% % Reduction in Volume: 72.5% Epithelialization: Large (67-100%) Tunneling: No Undermining: No Wound Description Full Thickness Without Exposed Support Foul O Classification: Structures Slough Wound Margin: Flat and Intact Exudate Medium Amount: Exudate Type: Purulent Exudate Color: yellow, [place], green dor After Cleansing:  No /Fibrino No Wound Bed Granulation Amount: Medium (34-66%) Exposed Structure Granulation Quality: Red Fascia Exposed: No Necrotic Amount: Medium (34-66%) Fat Layer (Subcutaneous Tissue) Exposed: No Necrotic Quality: Eschar, Adherent Slough Tendon Exposed: No Muscle Exposed: No Joint Exposed: No [name], [name] ([id number]) Bone Exposed: No Limited to Skin Breakdown Periwound Skin Texture Texture Color No Abnormalities Noted: No No Abnormalities Noted: No Callus: No Atrophie Blanche: No Crepitus: No Cyanosis: No Excoriation: No Ecchymosis: Yes Induration: Yes Erythema: No Rash: No Hemosiderin Staining: No Scarring: Yes Mottled: No Pallor: No Moisture Rubor: No No Abnormalities Noted: No Dry / Scaly: No Temperature / Pain Maceration: No Temperature: No Abnormality Wound Preparation Ulcer Cleansing: Rinsed/Irrigated with Saline Topical Anesthetic Applied: Other: lidocaine 4%, Treatment Notes Wound #2 (Left Abdomen - midline) 1. Cleansed with: Clean wound with Normal Saline 2. Anesthetic Topical Lidocaine 4% cream to wound bed prior to debridement 4. Dressing Applied: Other dressing (specify in notes) 5. Secondary Warren Notes silvercel Electronic Signature(s) Signed: 05/04/2017 5:21:02 PM By: Montey Hora Entered By: Montey Hora on 05/04/2017 10:44:52 [name] ([id number]) -------------------------------------------------------------------------------- Josephine Details Patient Name: [name] Date of Service: 05/04/2017 10:15 AM Medical Record Number: [id number] Patient Account Number: [id number] Date of Birth/Sex: Feb 18, 1946 (72 y.o. Male) Treating RN: Montey Hora Primary Care Lavenia Stumpo: Thomes Lolling Other Clinician: Referring Karla Vines: Thomes Lolling Treating Montay Vanvoorhis/Extender: Melburn Hake, HOYT Weeks in Treatment: 43 Vital Signs Time Taken: 10:41 Temperature (F): 98.1 Height (in): 65 Pulse (bpm): 95 Weight  (lbs): 161 Respiratory Rate (breaths/min): 16 Body Mass Index (BMI): 26.8 Blood Pressure (mmHg): 125/62 Reference Range: 80 - 120 mg / dl Electronic Signature(s) Signed: 05/04/2017 5:21:02 PM By: Montey Hora Entered By: Montey Hora on 05/04/2017 10:41:43

## 2017-05-18 ENCOUNTER — Encounter: Payer: Medicare HMO | Attending: Physician Assistant | Admitting: Physician Assistant

## 2017-05-18 DIAGNOSIS — Z87891 Personal history of nicotine dependence: Secondary | ICD-10-CM | POA: Insufficient documentation

## 2017-05-18 DIAGNOSIS — J449 Chronic obstructive pulmonary disease, unspecified: Secondary | ICD-10-CM | POA: Insufficient documentation

## 2017-05-18 DIAGNOSIS — M549 Dorsalgia, unspecified: Secondary | ICD-10-CM | POA: Insufficient documentation

## 2017-05-18 DIAGNOSIS — I10 Essential (primary) hypertension: Secondary | ICD-10-CM | POA: Diagnosis not present

## 2017-05-18 DIAGNOSIS — K219 Gastro-esophageal reflux disease without esophagitis: Secondary | ICD-10-CM | POA: Insufficient documentation

## 2017-05-18 DIAGNOSIS — E1142 Type 2 diabetes mellitus with diabetic polyneuropathy: Secondary | ICD-10-CM | POA: Insufficient documentation

## 2017-05-18 DIAGNOSIS — M1990 Unspecified osteoarthritis, unspecified site: Secondary | ICD-10-CM | POA: Insufficient documentation

## 2017-05-18 DIAGNOSIS — K432 Incisional hernia without obstruction or gangrene: Secondary | ICD-10-CM | POA: Diagnosis not present

## 2017-05-18 DIAGNOSIS — L98491 Non-pressure chronic ulcer of skin of other sites limited to breakdown of skin: Secondary | ICD-10-CM | POA: Insufficient documentation

## 2017-05-18 DIAGNOSIS — Z86010 Personal history of colonic polyps: Secondary | ICD-10-CM | POA: Insufficient documentation

## 2017-05-18 DIAGNOSIS — E11622 Type 2 diabetes mellitus with other skin ulcer: Secondary | ICD-10-CM | POA: Diagnosis not present

## 2017-05-18 DIAGNOSIS — E538 Deficiency of other specified B group vitamins: Secondary | ICD-10-CM | POA: Diagnosis not present

## 2017-05-18 DIAGNOSIS — G8929 Other chronic pain: Secondary | ICD-10-CM | POA: Diagnosis not present

## 2017-05-22 NOTE — Progress Notes (Addendum)
JAXAN, [name] ([id number]) Visit Report for 05/18/2017 Chief Complaint Document Details Patient Name: [name], [name]. Date of Service: 05/18/2017 10:15 AM Medical Record Number: [id number] Patient Account Number: [id number] Date of Birth/Sex: 1945/06/10 (72 y.o. M) Treating RN: Montey Hora Primary Care Provider: Thomes Lolling Other Clinician: Referring Provider: Thomes Lolling Treating Provider/Extender: Melburn Hake, HOYT Weeks in Treatment: 73 Information Obtained from: Patient Chief Complaint Patients presents for treatment of an open diabetic ulcer to the anterior abdominal wall in the epigastric and right upper quadrant Electronic Signature(s) Signed: 05/18/2017 6:07:32 PM By: Worthy Keeler PA-C Entered By: Worthy Keeler on 05/18/2017 10:10:21 [name] ([id number]) -------------------------------------------------------------------------------- Debridement Details Patient Name: [name] Date of Service: 05/18/2017 10:15 AM Medical Record Number: [id number] Patient Account Number: [id number] Date of Birth/Sex: Nov 26, 1945 (72 y.o. M) Treating RN: Montey Hora Primary Care Provider: Thomes Lolling Other Clinician: Referring Provider: Thomes Lolling Treating Provider/Extender: Melburn Hake, HOYT Weeks in Treatment: 43 Debridement Performed for Wound #2 Left Abdomen - midline Assessment: Performed By: Physician STONE III, HOYT E., PA-C Debridement Type: Debridement Pre-procedure Verification/Time Yes - 10:46 Out Taken: Start Time: 10:46 Pain Control: Lidocaine 4% Topical Solution Level: Skin/Subcutaneous Tissue Total Area Debrided (L x W): 0.4 (cm) x 0.4 (cm) = 0.16 (cm) Tissue and other material Viable, Non-Viable, Fibrin/Slough, Other, Subcutaneous debrided: Instrument: Curette Bleeding: Minimum Hemostasis Achieved: Pressure End Time: 10:48 Procedural Pain: 0 Post Procedural Pain: 0 Response to Treatment: Procedure was tolerated well Post Debridement  Measurements of Total Wound Length: (cm) 0.4 Width: (cm) 0.4 Depth: (cm) 0.2 Volume: (cm) 0.025 Character of Wound/Ulcer Post Debridement: Improved Post Procedure Diagnosis Same as Pre-procedure Electronic Signature(s) Signed: 05/30/2017 3:37:37 AM By: Worthy Keeler PA-C Signed: 08/16/2017 4:05:52 PM By: Montey Hora Previous Signature: 05/18/2017 4:45:57 PM Version By: Montey Hora Previous Signature: 05/18/2017 6:07:32 PM Version By: Worthy Keeler PA-C Entered By: Worthy Keeler on 05/30/2017 02:45:36 [name] ([id number]) -------------------------------------------------------------------------------- HPI Details Patient Name: [name] Date of Service: 05/18/2017 10:15 AM Medical Record Number: [id number] Patient Account Number: [id number] Date of Birth/Sex: May 25, 1945 (72 y.o. M) Treating RN: Montey Hora Primary Care Provider: Thomes Lolling Other Clinician: Referring Provider: Thomes Lolling Treating Provider/Extender: Melburn Hake, HOYT Weeks in Treatment: 52 History of Present Illness HPI Description: 72 year old gentleman has been seen recently by his PCP Dr. Thomes Lolling, who sees him with a history of diabetes mellitus, peripheral neuropathy, B12 deficiency, hypertension, osteoarthritis and the patient had recent blood sugar checked which was 163. Past medical history includes essential hypertension, incisional hernia, diabetes mellitus, colon polyps, COPD, tobacco abuse in the past, chewing tobacco, GERD. He has also been treated for chronic back pain with no sciatica and is on oxycodone. His last hemoglobin A1c was 6.9% His abdominal surgery was done at [facility] over 15 years ago and gradually there was sutures which protruded and then there was mesh which protruded. He washes this during his shower but does not put any dressing and the wound is covered with a lot of debris. He has never had a surgical consultation for this. 07/10/2016 -- the patient has  spoken to his PCP who said he was going to call me but I have not yet had a phone conversation with the physician. From what I understand the physician was reluctant to have a surgical opinion because of the patient's several comorbidities and he feels that the surgery may be detrimental to his overall health 07/24/2016 -- the patient's daughter is at  the bedside and we've had a detailed discussion regarding his options for a surgical opinion and possible surgical intervention before this becomes a emergent situation. She will seek primary care input and take the father to [facility] for a surgical opinion. 07/31/2016 -- the patient's daughter has organized for a surgical opinion at [facility] on June 14 08/14/2016 -- it has been about 3 weeks since he has stopped chewing tobacco. 08/28/2016 -- he was seen at [facility] by Dr. Roby Lofts -- the assessment was based on physical examination and review of his previous history of surgical repair. the patient had undergone a ventral hernia repair in 1998 with Dr. Quay Burow at [facility] at a prior open cholecystectomy incision. he was evaluated by Dr. Denice Paradise in 2013 and at that time it was decided to watch and wait. Dr. Payton Doughty recommended a repeat CT scan and requested records from dura and regional operative records and would follow-up the patient, after Mr. Kelleher PCP Dr. Gwenlyn Saran reviewed him for overall fitness for surgery. 09/04/16 patient's wound actually appear to be doing well on evaluation today. He is not really experience any discomfort and the right abdominal wound is dry and almost appears to be healing up. He continues to have some drainage from the left but this seems to be more of a potential seroma which with milking is able to be cleared out. I discussed with patient that when he performs his dressing changes at home it would be beneficial for him to do this as well. Otherwise there's no evidence of infection. He is still  waiting on his CT scan and appointments to determine whether he is going to have another surgery. 09/11/2016 -- the patient has had a CT scan and is awaiting his surgical appointment prior to deciding on any surgical intervention. 09/25/16 patient presents today for fault evaluation concerning his ongoing abdominal surgical wounds. Unfortunately they do not appear to be improving significantly but rather fluctuate from a little better to little worse week by week. Obviously this is somewhat frustrating for him. 10/02/2016 -- the patient has completed his CT scan, and has a review by the surgeons at the end of August. He has a new area which has opened up a little below his left abdominal wound. 10/16/16 on evaluation today patient's wounds on the abdominal region appears to be doing about the same. He actually has an appointment with his surgeon on the 21st for evaluation regarding the required products appointment. Obviously he is having a difficult time with the feeling of these wounds. Fortunately there does not appear to be any evidence of local infection such as significant amount of erythema or streaking. She also has an audio, vomiting, or diarrhea. He is not have any discomfort at this point. TURNER, BAILLIE ([id number]) 10/30/2016 -- the surgical review is tomorrow and I am awaiting the input of the surgeon. 11/16/2016 -- I had a call from his surgeon Dr. Payton Doughty, on phone number [phone] who was kind enough to call me regarding his care. After a thorough review and having consulted the abdominal wall reconstruction team they have decided that surgery will be too extensive and rather moribund for this gentleman and have asked him to continue with local care at the wound center. She will send me an official note via Epic. 03/09/2017 -- the patient comes for some abdominal wall wounds which were caused by protruding mesh and complete surgical review was done at [facility] and no  surgery  has been recommended. He continues to get palliative care and the right abdominal wound is completely healed. He has a few open spots on the left which are being treated with palliative care and we see him once a month 05/04/17 on evaluation today Mr. Baranowski presents for follow-up concerning his abdominal surgical ulcer. He does have some additional mashed taking out of the wound at this point although he has made a lot of progress since last time I saw him. There is good epithelialization in a lot of areas although there is still mesh poking through which is preventing complete closure. Fortunately there does not appear to be any evidence of infection. 05/18/17 on evaluation today patient appears to be doing very well in regard to his Adamo ulcer. Of the two areas that were previously open one has completely close in the proximal portion of the wound and the remainder of the opening which is the inferior portion is significantly smaller and there does not appear to be a lot of significant issue with mesh poking through the skin currently. In fact this is a nice rather circular indention where he has granulation in the base. There was some of the mesh still showing through but very little. Overall he has made significant improvements in my opinion since even two weeks ago when I saw him. Electronic Signature(s) Signed: 05/18/2017 6:07:32 PM By: Worthy Keeler PA-C Entered By: Worthy Keeler on 05/18/2017 17:12:00 [name] ([id number]) -------------------------------------------------------------------------------- Physical Exam Details Patient Name: [name], [name] Date of Service: 05/18/2017 10:15 AM Medical Record Number: [id number] Patient Account Number: [id number] Date of Birth/Sex: 10-30-1945 (72 y.o. M) Treating RN: Montey Hora Primary Care Provider: Thomes Lolling Other Clinician: Referring Provider: Thomes Lolling Treating Provider/Extender: Melburn Hake, HOYT Weeks in  Treatment: 67 Constitutional Well-nourished and well-hydrated in no acute distress. Respiratory normal breathing without difficulty. Psychiatric this patient is able to make decisions and demonstrates good insight into disease process. Alert and Oriented x 3. pleasant and cooperative. Notes Patient's wound bed shows mostly granular tissue although there was some of the mesh material poking through in the base of the wound. I did sharply debride this to clean away what I could so that we could hopefully allow for the best healing process to complete shortly. I'm gonna recommend that we do switch up the dressing orders as well. Electronic Signature(s) Signed: 05/18/2017 6:07:32 PM By: Worthy Keeler PA-C Entered By: Worthy Keeler on 05/18/2017 17:12:47 [name] ([id number]) -------------------------------------------------------------------------------- Physician Orders Details Patient Name: [name], [name] Date of Service: 05/18/2017 10:15 AM Medical Record Number: [id number] Patient Account Number: [id number] Date of Birth/Sex: 06-14-1945 (72 y.o. M) Treating RN: Montey Hora Primary Care Provider: Thomes Lolling Other Clinician: Referring Provider: Thomes Lolling Treating Provider/Extender: Sharalyn Ink in Treatment: 48 Verbal / Phone Orders: No Diagnosis Coding ICD-10 Coding Code Description E11.622 Type 2 diabetes mellitus with other skin ulcer Unspecified open wound of abdominal wall, right upper quadrant without penetration into peritoneal S31.100A cavity, initial encounter Unspecified open wound of abdominal wall, epigastric region without penetration into peritoneal cavity, S31.102A initial encounter F17.228 Nicotine dependence, chewing tobacco, with other nicotine-induced disorders Wound Cleansing Wound #2 Left Abdomen - midline o Clean wound with Normal Saline. o May Shower, gently pat wound dry prior to applying new dressing. Skin  Barriers/Peri-Wound Care Wound #2 Left Abdomen - midline o Skin Prep Primary Wound Dressing Wound #2 Left Abdomen - midline o Silvercel Non-Adherent - in clinic o Prisma  Ag - in hole o Other: - PolyMem Silver Secondary Dressing Wound #2 Left Abdomen - midline o Other - [place] Dressing Change Frequency Wound #2 Left Abdomen - midline o Change dressing every other day. Follow-up Appointments Wound #2 Left Abdomen - midline o Return Appointment in 2 weeks. Electronic Signature(s) Signed: 05/18/2017 4:45:57 PM By: Montey Hora Signed: 05/18/2017 6:07:32 PM By: Irean Hong [place], [place] ([id number]) Entered By: Montey Hora on 05/18/2017 10:51:54 [name], [name] ([id number]) -------------------------------------------------------------------------------- Problem List Details Patient Name: [name], [name] Date of Service: 05/18/2017 10:15 AM Medical Record Number: [id number] Patient Account Number: [id number] Date of Birth/Sex: 19-Aug-1945 (72 y.o. M) Treating RN: Montey Hora Primary Care Provider: Thomes Lolling Other Clinician: Referring Provider: Thomes Lolling Treating Provider/Extender: Sharalyn Ink in Treatment: 84 Active Problems ICD-10 Impacting Encounter Code Description Active Date Wound Healing Diagnosis E11.622 Type 2 diabetes mellitus with other skin ulcer 07/03/2016 No Yes S31.100A Unspecified open wound of abdominal wall, right upper 07/03/2016 No Yes quadrant without penetration into peritoneal cavity, initial encounter S31.102A Unspecified open wound of abdominal wall, epigastric region 07/03/2016 No Yes without penetration into peritoneal cavity, initial encounter F17.228 Nicotine dependence, chewing tobacco, with other nicotine- 07/03/2016 No Yes induced disorders Inactive Problems Resolved Problems Electronic Signature(s) Signed: 05/18/2017 6:07:32 PM By: Worthy Keeler PA-C Entered By: Worthy Keeler on 05/18/2017  10:10:14 [name] ([id number]) -------------------------------------------------------------------------------- Progress Note Details Patient Name: [name] Date of Service: 05/18/2017 10:15 AM Medical Record Number: [id number] Patient Account Number: [id number] Date of Birth/Sex: Oct 24, 1945 (72 y.o. M) Treating RN: Montey Hora Primary Care Provider: Thomes Lolling Other Clinician: Referring Provider: Thomes Lolling Treating Provider/Extender: Melburn Hake, HOYT Weeks in Treatment: 30 Subjective Chief Complaint Information obtained from Patient Patients presents for treatment of an open diabetic ulcer to the anterior abdominal wall in the epigastric and right upper quadrant History of Present Illness (HPI) 72 year old gentleman has been seen recently by his PCP Dr. Thomes Lolling, who sees him with a history of diabetes mellitus, peripheral neuropathy, B12 deficiency, hypertension, osteoarthritis and the patient had recent blood sugar checked which was 163. Past medical history includes essential hypertension, incisional hernia, diabetes mellitus, colon polyps, COPD, tobacco abuse in the past, chewing tobacco, GERD. He has also been treated for chronic back pain with no sciatica and is on oxycodone. His last hemoglobin A1c was 6.9% His abdominal surgery was done at [facility] over 15 years ago and gradually there was sutures which protruded and then there was mesh which protruded. He washes this during his shower but does not put any dressing and the wound is covered with a lot of debris. He has never had a surgical consultation for this. 07/10/2016 -- the patient has spoken to his PCP who said he was going to call me but I have not yet had a phone conversation with the physician. From what I understand the physician was reluctant to have a surgical opinion because of the patient's several comorbidities and he feels that the surgery may be detrimental to his overall  health 07/24/2016 -- the patient's daughter is at the bedside and we've had a detailed discussion regarding his options for a surgical opinion and possible surgical intervention before this becomes a emergent situation. She will seek primary care input and take the father to [facility] for a surgical opinion. 07/31/2016 -- the patient's daughter has organized for a surgical opinion at [facility] on June 14 08/14/2016 -- it  has been about 3 weeks since he has stopped chewing tobacco. 08/28/2016 -- he was seen at [facility] by Dr. Roby Lofts -- the assessment was based on physical examination and review of his previous history of surgical repair. the patient had undergone a ventral hernia repair in 1998 with Dr. Quay Burow at [facility] at a prior open cholecystectomy incision. he was evaluated by Dr. Denice Paradise in 2013 and at that time it was decided to watch and wait. Dr. Payton Doughty recommended a repeat CT scan and requested records from dura and regional operative records and would follow-up the patient, after Mr. Steagall PCP Dr. Gwenlyn Saran reviewed him for overall fitness for surgery. 09/04/16 patient's wound actually appear to be doing well on evaluation today. He is not really experience any discomfort and the right abdominal wound is dry and almost appears to be healing up. He continues to have some drainage from the left but this seems to be more of a potential seroma which with milking is able to be cleared out. I discussed with patient that when he performs his dressing changes at home it would be beneficial for him to do this as well. Otherwise there's no evidence of infection. He is still waiting on his CT scan and appointments to determine whether he is going to have another surgery. 09/11/2016 -- the patient has had a CT scan and is awaiting his surgical appointment prior to deciding on any surgical intervention. 09/25/16 patient presents today for fault evaluation concerning his ongoing  abdominal surgical wounds. Unfortunately they do not appear to be improving significantly but rather fluctuate from a little better to little worse week by week. Obviously this is somewhat frustrating for him. 10/02/2016 -- the patient has completed his CT scan, and has a review by the surgeons at the end of August. He has a new [name], [name]. ([id number]) area which has opened up a little below his left abdominal wound. 10/16/16 on evaluation today patient's wounds on the abdominal region appears to be doing about the same. He actually has an appointment with his surgeon on the 21st for evaluation regarding the required products appointment. Obviously he is having a difficult time with the feeling of these wounds. Fortunately there does not appear to be any evidence of local infection such as significant amount of erythema or streaking. She also has an audio, vomiting, or diarrhea. He is not have any discomfort at this point. 10/30/2016 -- the surgical review is tomorrow and I am awaiting the input of the surgeon. 11/16/2016 -- I had a call from his surgeon Dr. Payton Doughty, on phone number [phone] who was kind enough to call me regarding his care. After a thorough review and having consulted the abdominal wall reconstruction team they have decided that surgery will be too extensive and rather moribund for this gentleman and have asked him to continue with local care at the wound center. She will send me an official note via Epic. 03/09/2017 -- the patient comes for some abdominal wall wounds which were caused by protruding mesh and complete surgical review was done at [facility] and no surgery has been recommended. He continues to get palliative care and the right abdominal wound is completely healed. He has a few open spots on the left which are being treated with palliative care and we see him once a month 05/04/17 on evaluation today Mr. Vandeusen presents for follow-up concerning his  abdominal surgical ulcer. He does have some additional mashed taking out of the  wound at this point although he has made a lot of progress since last time I saw him. There is good epithelialization in a lot of areas although there is still mesh poking through which is preventing complete closure. Fortunately there does not appear to be any evidence of infection. 05/18/17 on evaluation today patient appears to be doing very well in regard to his Adamo ulcer. Of the two areas that were previously open one has completely close in the proximal portion of the wound and the remainder of the opening which is the inferior portion is significantly smaller and there does not appear to be a lot of significant issue with mesh poking through the skin currently. In fact this is a nice rather circular indention where he has granulation in the base. There was some of the mesh still showing through but very little. Overall he has made significant improvements in my opinion since even two weeks ago when I saw him. Patient History Information obtained from Patient. Social History Former smoker - chew tobacco, Marital Status - Widowed, Alcohol Use - Never, Drug Use - No History, Caffeine Use - Daily. 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 Respiratory The patient has no complaints or symptoms. Cardiovascular The patient has no complaints or symptoms. Psychiatric The patient has no complaints or symptoms. [name], [name] ([id number]) Objective Constitutional Well-nourished and well-hydrated in no acute distress. Vitals Time Taken: 10:12 AM, Height: 65 in, Weight: 161 lbs, BMI: 26.8, Temperature: 97.8 F, Pulse: 68 bpm, Respiratory Rate: 16 breaths/min, Blood Pressure: 171/82 mmHg. General Notes: Patient states he just took his blood pressure  medication before coming to his visit. Patient will recheck this afternoon and follow-up with PCP if it remains elevated. PA notified. Respiratory normal breathing without difficulty. Psychiatric this patient is able to make decisions and demonstrates good insight into disease process. Alert and Oriented x 3. pleasant and cooperative. General Notes: Patient's wound bed shows mostly granular tissue although there was some of the mesh material poking through in the base of the wound. I did sharply debride this to clean away what I could so that we could hopefully allow for the best healing process to complete shortly. I'm gonna recommend that we do switch up the dressing orders as well. Integumentary (Hair, Skin) Wound #2 status is Open. Original cause of wound was Gradually Appeared. The wound is located on the Left Abdomen - midline. The wound measures 4cm length x 0.4cm width x 0.1cm depth; 1.257cm^2 area and 0.126cm^3 volume. The wound is limited to skin breakdown. There is no tunneling or undermining noted. There is a medium amount of purulent drainage noted. The wound margin is flat and intact. There is large (67-100%) pink granulation within the wound bed. There is a small (1-33%) amount of necrotic tissue within the wound bed including Eschar. The periwound skin appearance exhibited: Induration, Scarring, Ecchymosis. The periwound skin appearance did not exhibit: Callus, Crepitus, Excoriation, Rash, Dry/Scaly, Maceration, Atrophie Blanche, Cyanosis, Hemosiderin Staining, Mottled, Pallor, Rubor, Erythema. Periwound temperature was noted as No Abnormality. Assessment Active Problems ICD-10 E11.622 - Type 2 diabetes mellitus with other skin ulcer S31.100A - Unspecified open wound of abdominal wall, right upper quadrant without penetration into peritoneal cavity, initial encounter S31.102A - Unspecified open wound of abdominal wall, epigastric region without penetration into peritoneal  cavity, initial encounter F17.228 - Nicotine dependence, chewing  tobacco, with other nicotine-induced disorders Procedures Wound #2 Pre-procedure diagnosis of Wound #2 is an Atypical located on the Left Abdomen - midline . There was a Skin/Subcutaneous [name], [name]. ([id number]) Tissue Debridement [phone]) debridement with total area of 0.16 sq cm performed by STONE III, HOYT E., PA-C. with the following instrument(s): Curette to remove Viable and Non-Viable tissue/material including Fibrin/Slough, Other, and Subcutaneous after achieving pain control using Lidocaine 4% Topical Solution. A time out was conducted at 10:46, prior to the start of the procedure. A Minimum amount of bleeding was controlled with Pressure. The procedure was tolerated well with a pain level of 0 throughout and a pain level of 0 following the procedure. Post Debridement Measurements: 0.4cm length x 0.4cm width x 0.2cm depth; 0.025cm^3 volume. Character of Wound/Ulcer Post Debridement is improved. Post procedure Diagnosis Wound #2: Same as Pre-Procedure Plan Wound Cleansing: Wound #2 Left Abdomen - midline: Clean wound with Normal Saline. May Shower, gently pat wound dry prior to applying new dressing. Skin Barriers/Peri-Wound Care: Wound #2 Left Abdomen - midline: Skin Prep Primary Wound Dressing: Wound #2 Left Abdomen - midline: Silvercel Non-Adherent - in clinic Prisma Ag - in hole Other: - PolyMem Silver Secondary Dressing: Wound #2 Left Abdomen - midline: Other - [place]a [place] Dressing Change Frequency: Wound #2 Left Abdomen - midline: Change dressing every other day. Follow-up Appointments: Wound #2 Left Abdomen - midline: Return Appointment in 2 weeks. Currently I'm gonna switch to a silver collagen dressing into the wound bed and then we will continue to cover this with the polymem as he has been doing up to this point. We will see were things stand in two weeks time we see him for  reevaluation. If anything changes in the interim he will contact our office for additional recommendations. Otherwise patient is definitely happy with how things are progressing at this point. I likewise am very happy. Please see above for specific wound care orders. Electronic Signature(s) Signed: 05/30/2017 3:37:37 AM By: Worthy Keeler PA-C Previous Signature: 05/18/2017 6:07:32 PM Version By: Worthy Keeler PA-C Entered By: Worthy Keeler on 05/30/2017 02:46:00 [name] ([id number]) -------------------------------------------------------------------------------- ROS/PFSH Details Patient Name: [name] Date of Service: 05/18/2017 10:15 AM Medical Record Number: [id number] Patient Account Number: [id number] Date of Birth/Sex: Mar 23, 1945 (72 y.o. M) Treating RN: Montey Hora Primary Care Provider: Thomes Lolling Other Clinician: Referring Provider: Thomes Lolling Treating Provider/Extender: Melburn Hake, HOYT Weeks in Treatment: 13 Information Obtained From Patient Wound History Do you currently have one or more open woundso Yes How many open wounds do you currently haveo 2 Approximately how long have you had your woundso more than 1 year How have you been treating your wound(s) until nowo open to air Has your wound(s) ever healed and then re-openedo No Have you had any lab work done in the past montho No Have you tested positive for an antibiotic resistant organism (MRSA, VRE)o No Have you tested positive for osteomyelitis (bone infection)o No Have you had any tests for circulation on your legso No Constitutional Symptoms (General Health) Complaints and Symptoms: Negative for: Fever; Chills Eyes Medical History: Positive for: Cataracts - removed Hematologic/Lymphatic Medical History: Negative for: Anemia; Hemophilia; Human Immunodeficiency Virus; Lymphedema; Sickle Cell Disease Respiratory Complaints and Symptoms: No Complaints or Symptoms Medical  History: Positive for: Chronic Obstructive Pulmonary Disease (COPD) Negative for: Aspiration; Asthma; Pneumothorax; Sleep Apnea; Tuberculosis Cardiovascular Complaints and Symptoms: No Complaints or Symptoms Medical History: Positive for: Hypertension Negative for: Angina; Arrhythmia; Congestive  Heart Failure; Coronary Artery Disease; Deep Vein Thrombosis; Hypotension; Myocardial Infarction; Peripheral Arterial Disease; Peripheral Venous Disease; Phlebitis; Vasculitis Gastrointestinal [name], [name] ([id number]) Medical History: Negative for: Cirrhosis ; Colitis; Crohnos; Hepatitis A; Hepatitis B; Hepatitis C Endocrine Medical History: Positive for: Type II Diabetes Treated with: Oral agents Blood sugar tested every day: No Genitourinary Medical History: Negative for: End Stage Renal Disease Immunological Medical History: Negative for: Lupus Erythematosus; Raynaudos; Scleroderma Integumentary (Skin) Medical History: Negative for: History of Burn; History of pressure wounds Musculoskeletal Medical History: Positive for: Osteoarthritis Negative for: Gout; Rheumatoid Arthritis; Osteomyelitis Neurologic Medical History: Positive for: Neuropathy Negative for: Dementia; Quadriplegia; Paraplegia; Seizure Disorder Oncologic Medical History: Negative for: Received Chemotherapy; Received Radiation Past Medical History Notes: 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 Psychiatric Complaints and Symptoms: No Complaints or Symptoms Medical History: Negative for: Anorexia/bulimia; Confinement Anxiety HBO Extended History Items Eyes: Cataracts Immunizations Pneumococcal Vaccine: [name], [name] ([id number]) Received Pneumococcal Vaccination: Yes Immunization Notes: up to date Implantable Devices Family and Social History Former smoker - chew tobacco; Marital Status - Widowed; Alcohol Use: Never; Drug Use: No History; Caffeine Use: Daily; Financial Concerns: No; Food,  Clothing or Shelter Needs: No; Support System Lacking: No; Transportation Concerns: No; Advanced Directives: No; Patient does not want information on Advanced Directives Physician Affirmation I have reviewed and agree with the above information. Electronic Signature(s) Signed: 05/18/2017 6:07:32 PM By: Worthy Keeler PA-C Signed: 05/21/2017 4:28:14 PM By: Montey Hora Entered By: Worthy Keeler on 05/18/2017 17:12:20 [name] ([id number]) -------------------------------------------------------------------------------- SuperBill Details Patient Name: [name], [name] Date of Service: 05/18/2017 Medical Record Number: [id number] Patient Account Number: [id number] Date of Birth/Sex: [birth year] (72 y.o. M) Treating RN: Montey Hora Primary Care Provider: Thomes Lolling Other Clinician: Referring Provider: Thomes Lolling Treating Provider/Extender: Melburn Hake, HOYT Weeks in Treatment: 45 Diagnosis Coding ICD-10 Codes Code Description E11.622 Type 2 diabetes mellitus with other skin ulcer Unspecified open wound of abdominal wall, right upper quadrant without penetration into peritoneal S31.100A cavity, initial encounter Unspecified open wound of abdominal wall, epigastric region without penetration into peritoneal cavity, S31.102A initial encounter F17.228 Nicotine dependence, chewing tobacco, with other nicotine-induced disorders Facility Procedures CPT4: Description Modifier Quantity Code 82993716 11042 - DEB SUBQ TISSUE 20 SQ CM/< 1 ICD-10 Diagnosis Description S31.100A Unspecified open wound of abdominal wall, right upper quadrant without penetration into peritoneal cavity, initial encounter Physician Procedures CPT4: Description Modifier Quantity Code 9678938 10175 - WC PHYS SUBQ TISS 20 SQ CM 1 ICD-10 Diagnosis Description S31.100A Unspecified open wound of abdominal wall, right upper quadrant without penetration into peritoneal cavity, initial encounter Electronic  Signature(s) Signed: 05/18/2017 6:07:32 PM By: Worthy Keeler PA-C Entered By: Worthy Keeler on 05/18/2017 17:14:13

## 2017-05-22 NOTE — Progress Notes (Signed)
[name] ([id number]) Visit Report for 05/18/2017 Arrival Information Details Patient Name: Michael, Jackson. Date of Service: 05/18/2017 10:15 AM Medical Record Number: [id number] Patient Account Number: [id number] Date of Birth/Sex: 1946/01/14 (72 y.o. Male) Treating RN: Cornell Barman Primary Care Kingston Guiles: Thomes Lolling Other Clinician: Referring Jamier Urbas: Thomes Lolling Treating Rashid Whitenight/Extender: Sharalyn Ink in Treatment: 12 Visit Information History Since Last Visit Added or deleted any medications: No Patient Arrived: Ambulatory Any new allergies or adverse reactions: No Arrival Time: 10:09 Had a fall or experienced change in No Accompanied By: self activities of daily living that may affect Transfer Assistance: None risk of falls: Patient Identification Verified: Yes Signs or symptoms of abuse/neglect since last visito No Secondary Verification Process Completed: Yes Hospitalized since last visit: No Patient Requires Transmission-Based No Pain Present Now: No Precautions: Patient Has Alerts: Yes Patient Alerts: DMII Electronic Signature(s) Signed: 05/18/2017 5:16:29 PM By: Gretta Cool, BSN, RN, CWS, Kim RN, BSN Entered By: Gretta Cool, BSN, RN, CWS, Kim on 05/18/2017 10:10:12 [name] ([id number]) -------------------------------------------------------------------------------- Encounter Discharge Information Details Patient Name: [name]. Date of Service: 05/18/2017 10:15 AM Medical Record Number: [id number] Patient Account Number: [id number] Date of Birth/Sex: 03-Feb-1946 (72 y.o. Male) Treating RN: Montey Hora Primary Care Kendle Erker: Thomes Lolling Other Clinician: Referring Rosine Solecki: Thomes Lolling Treating Sharron Petruska/Extender: Melburn Hake, HOYT Weeks in Treatment: 24 Encounter Discharge Information Items Discharge Pain Level: 0 Discharge Condition: Stable Ambulatory Status: Ambulatory Discharge Destination: Home Transportation: Private Auto Accompanied By:  self Schedule Follow-up Appointment: Yes Medication Reconciliation completed and No provided to Patient/Care Haywood Meinders: Patient Clinical Summary of Care: Declined Electronic Signature(s) Signed: 05/18/2017 4:41:33 PM By: Alric Quan Entered By: Alric Quan on 05/18/2017 10:57:25 [name] ([id number]) -------------------------------------------------------------------------------- Lower Extremity Assessment Details Patient Name: [name] Date of Service: 05/18/2017 10:15 AM Medical Record Number: [id number] Patient Account Number: [id number] Date of Birth/Sex: [birth year] (72 y.o. Male) Treating RN: Cornell Barman Primary Care Myranda Pavone: Thomes Lolling Other Clinician: Referring Tallan Sandoz: Thomes Lolling Treating Nessa Ramaker/Extender: Sharalyn Ink in Treatment: 45 Electronic Signature(s) Signed: 05/18/2017 5:16:29 PM By: Gretta Cool, BSN, RN, CWS, Kim RN, BSN Entered By: Gretta Cool, BSN, RN, CWS, Kim on 05/18/2017 10:18:26 [name] ([id number]) -------------------------------------------------------------------------------- Multi Wound Chart Details Patient Name: [name] Date of Service: 05/18/2017 10:15 AM Medical Record Number: [id number] Patient Account Number: [id number] Date of Birth/Sex: 1945-07-05 (72 y.o. Male) Treating RN: Montey Hora Primary Care Sayid Moll: Thomes Lolling Other Clinician: Referring Ygnacio Fecteau: Thomes Lolling Treating Starletta Houchin/Extender: Melburn Hake, HOYT Weeks in Treatment: 45 Vital Signs Height(in): 65 Pulse(bpm): 37 Weight(lbs): 161 Blood Pressure(mmHg): 171/82 Body Mass Index(BMI): 27 Temperature(F): 97.8 Respiratory Rate 16 (breaths/min): Photos: [2:No Photos] [N/A:N/A] Wound Location: [2:Left Abdomen - midline] [N/A:N/A] Wounding Event: [2:Gradually Appeared] [N/A:N/A] Primary Etiology: [2:Atypical] [N/A:N/A] Comorbid History: [2:Cataracts, Chronic Obstructive N/A Pulmonary Disease (COPD), Hypertension, Type II Diabetes,  Osteoarthritis, Neuropathy] Date Acquired: [2:05/17/2015] [N/A:N/A] Weeks of Treatment: [2:45] [N/A:N/A] Wound Status: [2:Open] [N/A:N/A] Clustered Wound: [2:Yes] [N/A:N/A] Clustered Quantity: [2:3] [N/A:N/A] Measurements L x W x D [2:4x0.4x0.1] [N/A:N/A] (cm) Area (cm) : [2:1.257] [N/A:N/A] Volume (cm) : [2:0.126] [N/A:N/A] % Reduction in Area: [2:76.20%] [N/A:N/A] % Reduction in Volume: [2:76.10%] [N/A:N/A] Classification: [2:Full Thickness Without Exposed Support Structures] [N/A:N/A] Exudate Amount: [2:Medium] [N/A:N/A] Exudate Type: [2:Purulent] [N/A:N/A] Exudate Color: [2:yellow, [place], green] [N/A:N/A] Wound Margin: [2:Flat and Intact] [N/A:N/A] Granulation Amount: [2:Large (67-100%)] [N/A:N/A] Granulation Quality: [2:Pink] [N/A:N/A] Necrotic Amount: [2:Small (1-33%)] [N/A:N/A] Necrotic Tissue: [2:Eschar] [N/A:N/A] Exposed Structures: [2:Fascia: No Fat Layer (Subcutaneous Tissue) Exposed: No Tendon: No  Muscle: No Joint: No] [N/A:N/A] Bone: No Limited to Skin Breakdown Epithelialization: Large (67-100%) N/A N/A Periwound Skin Texture: Induration: Yes N/A N/A Scarring: Yes Excoriation: No Callus: No Crepitus: No Rash: No Periwound Skin Moisture: Maceration: No N/A N/A Dry/Scaly: No Periwound Skin Color: Ecchymosis: Yes N/A N/A Atrophie Blanche: No Cyanosis: No Erythema: No Hemosiderin Staining: No Mottled: No Pallor: No Rubor: No Temperature: No Abnormality N/A N/A Tenderness on Palpation: No N/A N/A Wound Preparation: Ulcer Cleansing: N/A N/A Rinsed/Irrigated with Saline Topical Anesthetic Applied: Other: lidocaine 4% Treatment Notes Electronic Signature(s) Signed: 05/18/2017 4:45:57 PM By: Montey Hora Entered By: Montey Hora on 05/18/2017 10:46:07 [name] ([id number]) -------------------------------------------------------------------------------- [place] Plan Details Patient Name: Michael, Jackson Date of Service:  05/18/2017 10:15 AM Medical Record Number: [id number] Patient Account Number: [id number] Date of Birth/Sex: 1946-01-16 (72 y.o. Male) Treating RN: Montey Hora Primary Care Lucky Alverson: Thomes Lolling Other Clinician: Referring Jeannifer Drakeford: Thomes Lolling Treating Bristyn Kulesza/Extender: Melburn Hake, HOYT Weeks in Treatment: 45 Active Inactive ` Abuse / Safety / Falls / Self Care Management Nursing Diagnoses: Impaired physical mobility Goals: Patient will remain injury free Date Initiated: 07/03/2016 Target Resolution Date: 09/14/2016 Goal Status: Active Interventions: Assess fall risk on admission and as needed Notes: ` Orientation to the Wound Care Program Nursing Diagnoses: Knowledge deficit related to the wound healing center program Goals: Patient/caregiver will verbalize understanding of the [place] Date Initiated: 07/03/2016 Target Resolution Date: 09/15/2016 Goal Status: Active Interventions: Provide education on orientation to the wound center Notes: ` Wound/Skin Impairment Nursing Diagnoses: Impaired tissue integrity Goals: Patient/caregiver will verbalize understanding of skin care regimen Date Initiated: 07/03/2016 Target Resolution Date: 09/15/2016 Goal Status: Active Ulcer/skin breakdown will have a volume reduction of 30% by week 4 Date Initiated: 07/03/2016 Target Resolution Date: 09/15/2016 Michael, Jackson ([id number]) Goal Status: Active Ulcer/skin breakdown will have a volume reduction of 50% by week 8 Date Initiated: 07/03/2016 Target Resolution Date: 09/15/2016 Goal Status: Active Ulcer/skin breakdown will have a volume reduction of 80% by week 12 Date Initiated: 07/03/2016 Target Resolution Date: 09/15/2016 Goal Status: Active Ulcer/skin breakdown will heal within 14 weeks Date Initiated: 07/03/2016 Target Resolution Date: 09/15/2016 Goal Status: Active Interventions: Assess patient/caregiver ability to obtain necessary supplies Assess  patient/caregiver ability to perform ulcer/skin care regimen upon admission and as needed Assess ulceration(s) every visit Notes: Electronic Signature(s) Signed: 05/18/2017 4:45:57 PM By: Montey Hora Entered By: Montey Hora on 05/18/2017 10:45:59 [name] ([id number]) -------------------------------------------------------------------------------- Pain Assessment Details Patient Name: [name] Date of Service: 05/18/2017 10:15 AM Medical Record Number: [id number] Patient Account Number: [id number] Date of Birth/Sex: 1945/12/31 (72 y.o. Male) Treating RN: Cornell Barman Primary Care Reesha Debes: Thomes Lolling Other Clinician: Referring Duy Lemming: Thomes Lolling Treating Goro Wenrick/Extender: Melburn Hake, HOYT Weeks in Treatment: 45 Active Problems Location of Pain Severity and Description of Pain Patient Has Paino No Site Locations With Dressing Change: No Pain Management and Medication Current Pain Management: Goals for Pain Management Topical or injectable lidocaine is offered to patient for acute pain when surgical debridement is performed. If needed, Patient is instructed to use over the counter pain medication for the following 24-48 hours after debridement. Wound care MDs do not prescribed pain medications. Patient has chronic pain or uncontrolled pain. Patient has been instructed to make an appointment with their Primary Care Physician for pain management. Electronic Signature(s) Signed: 05/18/2017 5:16:29 PM By: Gretta Cool, BSN, RN, CWS, Kim RN, BSN Entered By: Gretta Cool, BSN, RN, CWS, Kim on 05/18/2017 10:12:12 Michael, Waneta Martins. (  [id number]) -------------------------------------------------------------------------------- Patient/Caregiver Education Details Patient Name: SAJJAD, HONEA. Date of Service: 05/18/2017 10:15 AM Medical Record Number: [id number] Patient Account Number: [id number] Date of Birth/Gender: Nov 10, 1945 (72 y.o. Male) Treating RN: Ahmed Prima Primary Care  Physician: Thomes Lolling Other Clinician: Referring Physician: Thomes Lolling Treating Physician/Extender: Sharalyn Ink in Treatment: 77 Education Assessment Education Provided To: Patient Education Topics Provided Wound/Skin Impairment: Handouts: Caring for Your Ulcer, Other: change dressing as ordered Methods: Demonstration, Explain/Verbal Responses: State content correctly Electronic Signature(s) Signed: 05/18/2017 4:41:33 PM By: Alric Quan Entered By: Alric Quan on 05/18/2017 10:57:42 [name] ([id number]) -------------------------------------------------------------------------------- Wound Assessment Details Patient Name: [name] Date of Service: 05/18/2017 10:15 AM Medical Record Number: [id number] Patient Account Number: [id number] Date of Birth/Sex: 07/30/1945 (72 y.o. Male) Treating RN: Cornell Barman Primary Care Dontell Mian: Thomes Lolling Other Clinician: Referring Shar Paez: Thomes Lolling Treating Trystyn Dolley/Extender: Melburn Hake, HOYT Weeks in Treatment: 45 Wound Status Wound Number: 2 Primary Atypical Etiology: Wound Location: Left Abdomen - midline Wound Open Wounding Event: Gradually Appeared Status: Date Acquired: 05/17/2015 Comorbid Cataracts, Chronic Obstructive Pulmonary Weeks Of Treatment: 45 History: Disease (COPD), Hypertension, Type II Clustered Wound: Yes Diabetes, Osteoarthritis, Neuropathy Wound Measurements Length: (cm) 4 Width: (cm) 0.4 Depth: (cm) 0.1 Clustered Quantity: 3 Area: (cm) 1.257 Volume: (cm) 0.126 % Reduction in Area: 76.2% % Reduction in Volume: 76.1% Epithelialization: Large (67-100%) Tunneling: No Undermining: No Wound Description Full Thickness Without Exposed Support Foul Od Classification: Structures Slough/ Wound Margin: Flat and Intact Exudate Medium Amount: Exudate Type: Purulent Exudate Color: yellow, [place], green or After Cleansing: No Fibrino No Wound Bed Granulation Amount:  Large (67-100%) Exposed Structure Granulation Quality: Pink Fascia Exposed: No Necrotic Amount: Small (1-33%) Fat Layer (Subcutaneous Tissue) Exposed: No Necrotic Quality: Eschar Tendon Exposed: No Muscle Exposed: No Joint Exposed: No Bone Exposed: No Limited to Skin Breakdown Periwound Skin Texture Texture Color No Abnormalities Noted: No No Abnormalities Noted: No Callus: No Atrophie Blanche: No Crepitus: No Cyanosis: No Excoriation: No Ecchymosis: Yes Induration: Yes Erythema: No Rash: No Hemosiderin Staining: No Scarring: Yes Mottled: No Pallor: No Michael, Jackson. ([id number]) Moisture Rubor: No No Abnormalities Noted: No Temperature / Pain Dry / Scaly: No Temperature: No Abnormality Maceration: No Wound Preparation Ulcer Cleansing: Rinsed/Irrigated with Saline Topical Anesthetic Applied: Other: lidocaine 4%, Treatment Notes Wound #2 (Left Abdomen - midline) 1. Cleansed with: Clean wound with Normal Saline 2. Anesthetic Topical Lidocaine 4% cream to wound bed prior to debridement 3. Peri-wound Care: Skin Prep 4. Dressing Applied: Prisma Ag Other dressing (specify in notes) 5. Secondary Morral Notes silvercel top open area, Prisma to the lower open area Engineer, maintenance) Signed: 05/18/2017 5:16:29 PM By: Gretta Cool, BSN, RN, CWS, Kim RN, BSN Entered By: Gretta Cool, BSN, RN, CWS, Kim on 05/18/2017 10:18:16 [name] ([id number]) -------------------------------------------------------------------------------- [place] Details Patient Name: [name] Date of Service: 05/18/2017 10:15 AM Medical Record Number: [id number] Patient Account Number: [id number] Date of Birth/Sex: 1945/05/20 (72 y.o. Male) Treating RN: Cornell Barman Primary Care Tayte Mcwherter: Thomes Lolling Other Clinician: Referring Aili Casillas: Thomes Lolling Treating Anvith Mauriello/Extender: Melburn Hake, HOYT Weeks in Treatment: 45 Vital Signs Time Taken: 10:12 Temperature (F):  97.8 Height (in): 65 Pulse (bpm): 68 Weight (lbs): 161 Respiratory Rate (breaths/min): 16 Body Mass Index (BMI): 26.8 Blood Pressure (mmHg): 171/82 Reference Range: 80 - 120 mg / dl Notes Patient states he just took his blood pressure medication before coming to his visit. Patient will recheck this afternoon and follow-up with PCP if  it remains elevated. PA notified. Electronic Signature(s) Signed: 05/18/2017 5:16:29 PM By: Gretta Cool, BSN, RN, CWS, Kim RN, BSN Entered By: Gretta Cool, BSN, RN, CWS, Kim on 05/18/2017 10:14:22

## 2017-05-23 MED ORDER — OXYCODONE-ACETAMINOPHEN 5 MG-325 MG TABLET
ORAL_TABLET | ORAL | 0 refills | 0 days | Status: CP | PRN
Start: 2017-05-23 — End: 2017-07-02

## 2017-05-28 ENCOUNTER — Encounter: Admit: 2017-05-28 | Discharge: 2017-05-29 | Payer: MEDICARE | Attending: Ophthalmology | Primary: Ophthalmology

## 2017-05-28 DIAGNOSIS — E1142 Type 2 diabetes mellitus with diabetic polyneuropathy: Principal | ICD-10-CM

## 2017-05-28 DIAGNOSIS — H5213 Myopia, bilateral: Secondary | ICD-10-CM

## 2017-05-28 DIAGNOSIS — E119 Type 2 diabetes mellitus without complications: Secondary | ICD-10-CM

## 2017-05-28 DIAGNOSIS — H524 Presbyopia: Secondary | ICD-10-CM

## 2017-05-28 DIAGNOSIS — H52203 Unspecified astigmatism, bilateral: Secondary | ICD-10-CM

## 2017-05-28 DIAGNOSIS — Z961 Presence of intraocular lens: Secondary | ICD-10-CM

## 2017-05-28 DIAGNOSIS — H04123 Dry eye syndrome of bilateral lacrimal glands: Secondary | ICD-10-CM

## 2017-05-28 MED ORDER — HYDROCODONE 5 MG-ACETAMINOPHEN 325 MG TABLET
ORAL_TABLET | Freq: Four times a day (QID) | ORAL | 0 refills | 0 days | Status: CP | PRN
Start: 2017-05-28 — End: 2017-04-05

## 2017-06-01 ENCOUNTER — Ambulatory Visit: Payer: Medicare HMO | Admitting: Physician Assistant

## 2017-06-01 ENCOUNTER — Encounter: Admit: 2017-06-01 | Discharge: 2017-06-02 | Payer: MEDICARE

## 2017-06-01 DIAGNOSIS — N138 Other obstructive and reflux uropathy: Secondary | ICD-10-CM

## 2017-06-01 DIAGNOSIS — Z7189 Other specified counseling: Secondary | ICD-10-CM

## 2017-06-01 DIAGNOSIS — Z125 Encounter for screening for malignant neoplasm of prostate: Secondary | ICD-10-CM

## 2017-06-01 DIAGNOSIS — N503 Cyst of epididymis: Secondary | ICD-10-CM

## 2017-06-01 DIAGNOSIS — N401 Enlarged prostate with lower urinary tract symptoms: Principal | ICD-10-CM

## 2017-06-01 MED ORDER — FINASTERIDE 5 MG TABLET
ORAL_TABLET | Freq: Every day | ORAL | 3 refills | 0 days | Status: CP
Start: 2017-06-01 — End: 2018-06-17

## 2017-06-08 ENCOUNTER — Ambulatory Visit: Payer: Medicare HMO | Admitting: Physician Assistant

## 2017-06-14 ENCOUNTER — Encounter: Admit: 2017-06-14 | Discharge: 2017-06-15 | Payer: MEDICARE

## 2017-06-22 ENCOUNTER — Encounter: Payer: Medicare HMO | Attending: Physician Assistant | Admitting: Physician Assistant

## 2017-06-22 DIAGNOSIS — G8929 Other chronic pain: Secondary | ICD-10-CM | POA: Insufficient documentation

## 2017-06-22 DIAGNOSIS — I10 Essential (primary) hypertension: Secondary | ICD-10-CM | POA: Insufficient documentation

## 2017-06-22 DIAGNOSIS — E538 Deficiency of other specified B group vitamins: Secondary | ICD-10-CM | POA: Insufficient documentation

## 2017-06-22 DIAGNOSIS — K432 Incisional hernia without obstruction or gangrene: Secondary | ICD-10-CM | POA: Insufficient documentation

## 2017-06-22 DIAGNOSIS — E1142 Type 2 diabetes mellitus with diabetic polyneuropathy: Secondary | ICD-10-CM | POA: Insufficient documentation

## 2017-06-22 DIAGNOSIS — Z86010 Personal history of colonic polyps: Secondary | ICD-10-CM | POA: Diagnosis not present

## 2017-06-22 DIAGNOSIS — Z87891 Personal history of nicotine dependence: Secondary | ICD-10-CM | POA: Diagnosis not present

## 2017-06-22 DIAGNOSIS — M1990 Unspecified osteoarthritis, unspecified site: Secondary | ICD-10-CM | POA: Diagnosis not present

## 2017-06-22 DIAGNOSIS — J449 Chronic obstructive pulmonary disease, unspecified: Secondary | ICD-10-CM | POA: Insufficient documentation

## 2017-06-22 DIAGNOSIS — E1151 Type 2 diabetes mellitus with diabetic peripheral angiopathy without gangrene: Secondary | ICD-10-CM | POA: Diagnosis not present

## 2017-06-22 DIAGNOSIS — M549 Dorsalgia, unspecified: Secondary | ICD-10-CM | POA: Insufficient documentation

## 2017-06-22 DIAGNOSIS — K219 Gastro-esophageal reflux disease without esophagitis: Secondary | ICD-10-CM | POA: Insufficient documentation

## 2017-06-22 DIAGNOSIS — E11622 Type 2 diabetes mellitus with other skin ulcer: Secondary | ICD-10-CM | POA: Insufficient documentation

## 2017-06-22 DIAGNOSIS — L98491 Non-pressure chronic ulcer of skin of other sites limited to breakdown of skin: Secondary | ICD-10-CM | POA: Diagnosis not present

## 2017-06-28 MED ORDER — FLUTICASONE PROPIONATE 50 MCG/ACTUATION NASAL SPRAY,SUSPENSION
4 refills | 0 days | Status: CP
Start: 2017-06-28 — End: 2018-10-10

## 2017-06-29 NOTE — Progress Notes (Signed)
SLATER, MCMANAMAN ([id number]) Visit Report for 06/22/2017 Arrival Information Details Patient Name: [name], [name]. Date of Service: 06/22/2017 1:15 PM Medical Record Number: [id number] Patient Account Number: [id number] Date of Birth/Sex: October 06, 1945 (72 y.o. M) Treating RN: Ahmed Prima Primary Care Kuzey Ogata: Thomes Lolling Other Clinician: Referring Deboraha Goar: Thomes Lolling Treating Analise Glotfelty/Extender: Melburn Hake, HOYT Weeks in Treatment: 19 Visit Information History Since Last Visit All ordered tests and consults were completed: No Patient Arrived: Ambulatory Added or deleted any medications: No Arrival Time: 13:17 Any new allergies or adverse reactions: No Accompanied By: self Had a fall or experienced change in No Transfer Assistance: None activities of daily living that may affect Patient Identification Verified: Yes risk of falls: Secondary Verification Process Completed: Yes Signs or symptoms of abuse/neglect since last visito No Patient Requires Transmission-Based No Hospitalized since last visit: No Precautions: Implantable device outside of the clinic excluding No Patient Has Alerts: Yes cellular tissue based products placed in the center Patient Alerts: DMII since last visit: Has Dressing in Place as Prescribed: Yes Pain Present Now: No Electronic Signature(s) Signed: 06/25/2017 4:22:27 PM By: Alric Quan Entered By: Alric Quan on 06/22/2017 13:17:37 [name] ([id number]) -------------------------------------------------------------------------------- Clinic Level of Care Assessment Details Patient Name: [name] Date of Service: 06/22/2017 1:15 PM Medical Record Number: [id number] Patient Account Number: [id number] Date of Birth/Sex: [birth year] (72 y.o. M) Treating RN: Montey Hora Primary Care Yotam Rhine: Thomes Lolling Other Clinician: Referring Lucile Hillmann: Thomes Lolling Treating Diago Haik/Extender: Melburn Hake, HOYT Weeks in Treatment:  47 Clinic Level of Care Assessment Items TOOL 4 Quantity Score []  - Use when only an EandM is performed on FOLLOW-UP visit 0 ASSESSMENTS - Nursing Assessment / Reassessment X - Reassessment of Co-morbidities (includes updates in patient status) 1 10 X- 1 5 Reassessment of Adherence to Treatment Plan ASSESSMENTS - Wound and Skin Assessment / Reassessment X - Simple Wound Assessment / Reassessment - one wound 1 5 []  - 0 Complex Wound Assessment / Reassessment - multiple wounds []  - 0 Dermatologic / Skin Assessment (not related to wound area) ASSESSMENTS - Focused Assessment []  - Circumferential Edema Measurements - multi extremities 0 []  - 0 Nutritional Assessment / Counseling / Intervention []  - 0 Lower Extremity Assessment (monofilament, tuning fork, pulses) []  - 0 Peripheral Arterial Disease Assessment (using hand held doppler) ASSESSMENTS - Ostomy and/or Continence Assessment and Care []  - Incontinence Assessment and Management 0 []  - 0 Ostomy Care Assessment and Management (repouching, etc.) PROCESS - Coordination of Care X - Simple Patient / Family Education for ongoing care 1 15 []  - 0 Complex (extensive) Patient / Family Education for ongoing care []  - 0 Staff obtains Programmer, systems, Records, Test Results / Process Orders []  - 0 Staff telephones HHA, Nursing Homes / Clarify orders / etc []  - 0 Routine Transfer to another Facility (non-emergent condition) []  - 0 Routine Hospital Admission (non-emergent condition) []  - 0 New Admissions / Biomedical engineer / Ordering NPWT, Apligraf, etc. []  - 0 Emergency Hospital Admission (emergent condition) X- 1 10 Simple Discharge Coordination [name], [name] ([id number]) []  - 0 Complex (extensive) Discharge Coordination PROCESS - Special Needs []  - Pediatric / Minor Patient Management 0 []  - 0 Isolation Patient Management []  - 0 Hearing / Language / Visual special needs []  - 0 Assessment of Community assistance  (transportation, D/C planning, etc.) []  - 0 Additional assistance / Altered mentation []  - 0 Support Surface(s) Assessment (bed, cushion, seat, etc.) INTERVENTIONS - Wound Cleansing / Measurement X - Simple Wound  Cleansing - one wound 1 5 []  - 0 Complex Wound Cleansing - multiple wounds X- 1 5 Wound Imaging (photographs - any number of wounds) []  - 0 Wound Tracing (instead of photographs) X- 1 5 Simple Wound Measurement - one wound []  - 0 Complex Wound Measurement - multiple wounds INTERVENTIONS - Wound Dressings X - Small Wound Dressing one or multiple wounds 1 10 []  - 0 Medium Wound Dressing one or multiple wounds []  - 0 Large Wound Dressing one or multiple wounds []  - 0 Application of Medications - topical []  - 0 Application of Medications - injection INTERVENTIONS - Miscellaneous []  - External ear exam 0 []  - 0 Specimen Collection (cultures, biopsies, blood, body fluids, etc.) []  - 0 Specimen(s) / Culture(s) sent or taken to Lab for analysis []  - 0 Patient Transfer (multiple staff / Civil Service fast streamer / Similar devices) []  - 0 Simple Staple / Suture removal (25 or less) []  - 0 Complex Staple / Suture removal (26 or more) []  - 0 Hypo / Hyperglycemic Management (close monitor of Blood Glucose) []  - 0 Ankle / Brachial Index (ABI) - do not check if billed separately X- 1 5 Vital Signs [name], [name]. ([id number]) Has the patient been seen at the hospital within the last three years: Yes Total Score: 75 Level Of Care: New/Established - Level 2 Electronic Signature(s) Signed: 06/26/2017 3:14:51 PM By: Montey Hora Entered By: Montey Hora on 06/25/2017 16:08:48 [name] ([id number]) -------------------------------------------------------------------------------- Encounter Discharge Information Details Patient Name: [name] Date of Service: 06/22/2017 1:15 PM Medical Record Number: [id number] Patient Account Number: [id number] Date of Birth/Sex: 12-Dec-1945  (72 y.o. M) Treating RN: Montey Hora Primary Care Samanta Gal: Thomes Lolling Other Clinician: Referring Mayrin Schmuck: Thomes Lolling Treating Yesica Kemler/Extender: Melburn Hake, HOYT Weeks in Treatment: 39 Encounter Discharge Information Items Discharge Pain Level: 0 Discharge Condition: Stable Ambulatory Status: Ambulatory Discharge Destination: Home Private Transportation: Auto Accompanied By: self Schedule Follow-up Appointment: Yes Medication Reconciliation completed and provided No to Patient/Care Gardiner Espana: Clinical Summary of Care: Electronic Signature(s) Signed: 06/22/2017 4:45:51 PM By: Montey Hora Entered By: Montey Hora on 06/22/2017 13:57:24 [name] ([id number]) -------------------------------------------------------------------------------- Lower Extremity Assessment Details Patient Name: [name] Date of Service: 06/22/2017 1:15 PM Medical Record Number: [id number] Patient Account Number: [id number] Date of Birth/Sex: 03-15-1945 (72 y.o. M) Treating RN: Ahmed Prima Primary Care Andray Assefa: Thomes Lolling Other Clinician: Referring Freeman Borba: Thomes Lolling Treating Zhara Gieske/Extender: Melburn Hake, HOYT Weeks in Treatment: 50 Electronic Signature(s) Signed: 06/25/2017 4:22:27 PM By: Alric Quan Entered By: Alric Quan on 06/22/2017 13:24:13 [name] ([id number]) -------------------------------------------------------------------------------- Multi Wound Chart Details Patient Name: [name] Date of Service: 06/22/2017 1:15 PM Medical Record Number: [id number] Patient Account Number: [id number] Date of Birth/Sex: 09/29/1945 (72 y.o. M) Treating RN: Montey Hora Primary Care Antwone Capozzoli: Thomes Lolling Other Clinician: Referring Adolphe Fortunato: Thomes Lolling Treating Obaloluwa Delatte/Extender: Melburn Hake, HOYT Weeks in Treatment: 50 Vital Signs Height(in): 65 Pulse(bpm): 80 Weight(lbs): 161 Blood Pressure(mmHg): 130/65 Body Mass Index(BMI):  27 Temperature(F): 98.0 Respiratory Rate 16 (breaths/min): Photos: [2:No Photos] [N/A:N/A] Wound Location: [2:Left Abdomen - midline] [N/A:N/A] Wounding Event: [2:Gradually Appeared] [N/A:N/A] Primary Etiology: [2:Atypical] [N/A:N/A] Comorbid History: [2:Cataracts, Chronic Obstructive N/A Pulmonary Disease (COPD), Hypertension, Type II Diabetes, Osteoarthritis, Neuropathy] Date Acquired: [2:05/17/2015] [N/A:N/A] Weeks of Treatment: [2:50] [N/A:N/A] Wound Status: [2:Open] [N/A:N/A] Clustered Wound: [2:Yes] [N/A:N/A] Clustered Quantity: [2:3] [N/A:N/A] Measurements L x W x D [2:4.4x0.4x0.1] [N/A:N/A] (cm) Area (cm) : [2:1.382] [N/A:N/A] Volume (cm) : [2:0.138] [N/A:N/A] % Reduction in Area: [2:73.80%] [N/A:N/A] %  Reduction in Volume: [2:73.90%] [N/A:N/A] Classification: [2:Full Thickness Without Exposed Support Structures] [N/A:N/A] Exudate Amount: [2:Medium] [N/A:N/A] Exudate Type: [2:Purulent] [N/A:N/A] Exudate Color: [2:yellow, Mill, green] [N/A:N/A] Wound Margin: [2:Flat and Intact] [N/A:N/A] Granulation Amount: [2:Large (67-100%)] [N/A:N/A] Granulation Quality: [2:Pink] [N/A:N/A] Necrotic Amount: [2:Small (1-33%)] [N/A:N/A] Necrotic Tissue: [2:Eschar, Adherent Slough] [N/A:N/A] Exposed Structures: [2:Fascia: No Fat Layer (Subcutaneous Tissue) Exposed: No Tendon: No Muscle: No Joint: No] [N/A:N/A] Bone: No Limited to Skin Breakdown Epithelialization: Large (67-100%) N/A N/A Periwound Skin Texture: Induration: Yes N/A N/A Scarring: Yes Excoriation: No Callus: No Crepitus: No Rash: No Periwound Skin Moisture: Maceration: No N/A N/A Dry/Scaly: No Periwound Skin Color: Ecchymosis: Yes N/A N/A Atrophie Blanche: No Cyanosis: No Erythema: No Hemosiderin Staining: No Mottled: No Pallor: No Rubor: No Temperature: No Abnormality N/A N/A Tenderness on Palpation: No N/A N/A Wound Preparation: Ulcer Cleansing: N/A N/A Rinsed/Irrigated with Saline Topical  Anesthetic Applied: Other: lidocaine 4% Treatment Notes Electronic Signature(s) Signed: 06/22/2017 4:45:51 PM By: Montey Hora Entered By: Montey Hora on 06/22/2017 13:40:26 [name] ([id number]) -------------------------------------------------------------------------------- Multi-Disciplinary Care Plan Details Patient Name: [name], [name] Date of Service: 06/22/2017 1:15 PM Medical Record Number: [id number] Patient Account Number: [id number] Date of Birth/Sex: 1945/12/18 (72 y.o. M) Treating RN: Montey Hora Primary Care Chasiti Waddington: Thomes Lolling Other Clinician: Referring Pearl Bents: Thomes Lolling Treating Aymee Fomby/Extender: Melburn Hake, HOYT Weeks in Treatment: 63 Active Inactive ` Abuse / Safety / Falls / Self Care Management Nursing Diagnoses: Impaired physical mobility Goals: Patient will remain injury free Date Initiated: 07/03/2016 Target Resolution Date: 09/14/2016 Goal Status: Active Interventions: Assess fall risk on admission and as needed Notes: ` Orientation to the Wound Care Program Nursing Diagnoses: Knowledge deficit related to the wound healing center program Goals: Patient/caregiver will verbalize understanding of the [place] Date Initiated: 07/03/2016 Target Resolution Date: 09/15/2016 Goal Status: Active Interventions: Provide education on orientation to the wound center Notes: ` Wound/Skin Impairment Nursing Diagnoses: Impaired tissue integrity Goals: Patient/caregiver will verbalize understanding of skin care regimen Date Initiated: 07/03/2016 Target Resolution Date: 09/15/2016 Goal Status: Active Ulcer/skin breakdown will have a volume reduction of 30% by week 4 Date Initiated: 07/03/2016 Target Resolution Date: 09/15/2016 [name], [name] ([id number]) Goal Status: Active Ulcer/skin breakdown will have a volume reduction of 50% by week 8 Date Initiated: 07/03/2016 Target Resolution Date: 09/15/2016 Goal Status:  Active Ulcer/skin breakdown will have a volume reduction of 80% by week 12 Date Initiated: 07/03/2016 Target Resolution Date: 09/15/2016 Goal Status: Active Ulcer/skin breakdown will heal within 14 weeks Date Initiated: 07/03/2016 Target Resolution Date: 09/15/2016 Goal Status: Active Interventions: Assess patient/caregiver ability to obtain necessary supplies Assess patient/caregiver ability to perform ulcer/skin care regimen upon admission and as needed Assess ulceration(s) every visit Notes: Electronic Signature(s) Signed: 06/22/2017 4:45:51 PM By: Montey Hora Entered By: Montey Hora on 06/22/2017 13:40:18 [name] ([id number]) -------------------------------------------------------------------------------- Pain Assessment Details Patient Name: [name] Date of Service: 06/22/2017 1:15 PM Medical Record Number: [id number] Patient Account Number: [id number] Date of Birth/Sex: 1945/06/24 (72 y.o. M) Treating RN: Ahmed Prima Primary Care Kiril Hippe: Thomes Lolling Other Clinician: Referring Zairah Arista: Thomes Lolling Treating Anjelo Pullman/Extender: Melburn Hake, HOYT Weeks in Treatment: 50 Active Problems Location of Pain Severity and Description of Pain Patient Has Paino No Site Locations Pain Management and Medication Current Pain Management: Electronic Signature(s) Signed: 06/25/2017 4:22:27 PM By: Alric Quan Entered By: Alric Quan on 06/22/2017 13:17:42 [name] ([id number]) -------------------------------------------------------------------------------- Patient/Caregiver Education Details Patient Name: [name] Date of Service: 06/22/2017 1:15 PM Medical  Record Number: [id number] Patient Account Number: [id number] Date of Birth/Gender: 11-15-1945 (72 y.o. M) Treating RN: Montey Hora Primary Care Physician: Thomes Lolling Other Clinician: Referring Physician: Thomes Lolling Treating Physician/Extender: Sharalyn Ink in Treatment:  50 Education Assessment Education Provided To: Patient Education Topics Provided Wound/Skin Impairment: Handouts: Caring for Your Ulcer, Other: change dressing as ordered Methods: Demonstration, Explain/Verbal Responses: State content correctly Electronic Signature(s) Signed: 06/22/2017 4:45:51 PM By: Montey Hora Entered By: Montey Hora on 06/22/2017 13:57:44 [name] ([id number]) -------------------------------------------------------------------------------- Wound Assessment Details Patient Name: [name] Date of Service: 06/22/2017 1:15 PM Medical Record Number: [id number] Patient Account Number: [id number] Date of Birth/Sex: 1945/07/06 (72 y.o. M) Treating RN: Ahmed Prima Primary Care Meagon Duskin: Thomes Lolling Other Clinician: Referring Aarian Cleaver: Thomes Lolling Treating Kamille Toomey/Extender: Melburn Hake, HOYT Weeks in Treatment: 50 Wound Status Wound Number: 2 Primary Atypical Etiology: Wound Location: Left Abdomen - midline Wound Open Wounding Event: Gradually Appeared Status: Date Acquired: 05/17/2015 Comorbid Cataracts, Chronic Obstructive Pulmonary Weeks Of Treatment: 50 History: Disease (COPD), Hypertension, Type II Clustered Wound: Yes Diabetes, Osteoarthritis, Neuropathy Photos Photo Uploaded By: Gretta Cool, BSN, RN, CWS, Kim on 06/22/2017 16:46:25 Wound Measurements Length: (cm) 4.4 % Reduct Width: (cm) 0.4 % Reduct Depth: (cm) 0.1 Epitheli Clustered Quantity: 3 Tunnelin Area: (cm) 1.382 Undermi Volume: (cm) 0.138 ion in Area: 73.8% ion in Volume: 73.9% alization: Large (67-100%) g: No ning: No Wound Description Full Thickness Without Exposed Support Foul Odo Classification: Structures Slough/F Wound Margin: Flat and Intact Exudate Medium Amount: Exudate Type: Purulent Exudate Color: yellow, Sanden, green r After Cleansing: No ibrino No Wound Bed Granulation Amount: Large (67-100%) Exposed Structure Granulation Quality:  Pink Fascia Exposed: No Necrotic Amount: Small (1-33%) Fat Layer (Subcutaneous Tissue) Exposed: No Necrotic Quality: Eschar, Adherent Slough Tendon Exposed: No Muscle Exposed: No Joint Exposed: No Bone Exposed: No Limited to Skin Breakdown [name], [name]. ([id number]) Periwound Skin Texture Texture Color No Abnormalities Noted: No No Abnormalities Noted: No Callus: No Atrophie Blanche: No Crepitus: No Cyanosis: No Excoriation: No Ecchymosis: Yes Induration: Yes Erythema: No Rash: No Hemosiderin Staining: No Scarring: Yes Mottled: No Pallor: No Moisture Rubor: No No Abnormalities Noted: No Dry / Scaly: No Temperature / Pain Maceration: No Temperature: No Abnormality Wound Preparation Ulcer Cleansing: Rinsed/Irrigated with Saline Topical Anesthetic Applied: Other: lidocaine 4%, Treatment Notes Wound #2 (Left Abdomen - midline) 1. Cleansed with: Clean wound with Normal Saline 2. Anesthetic Topical Lidocaine 4% cream to wound bed prior to debridement 4. Dressing Applied: Prisma Ag 5. Secondary [place] Signature(s) Signed: 06/25/2017 4:22:27 PM By: Alric Quan Entered By: Alric Quan on 06/22/2017 13:23:33 [name] ([id number]) -------------------------------------------------------------------------------- [place] Details Patient Name: [name] Date of Service: 06/22/2017 1:15 PM Medical Record Number: [id number] Patient Account Number: [id number] Date of Birth/Sex: 16-Apr-1945 (72 y.o. M) Treating RN: Ahmed Prima Primary Care Joffre Lucks: Thomes Lolling Other Clinician: Referring Harlan Ervine: Thomes Lolling Treating Naman Spychalski/Extender: Melburn Hake, HOYT Weeks in Treatment: 50 Vital Signs Time Taken: 13:17 Temperature (F): 98.0 Height (in): 65 Pulse (bpm): 80 Weight (lbs): 161 Respiratory Rate (breaths/min): 16 Body Mass Index (BMI): 26.8 Blood Pressure (mmHg): 130/65 Reference Range: 80 - 120 mg /  dl Electronic Signature(s) Signed: 06/25/2017 4:22:27 PM By: Alric Quan Entered By: Alric Quan on 06/22/2017 13:20:14

## 2017-06-29 NOTE — Progress Notes (Signed)
Michael, Jackson ([id number]) Visit Report for 06/22/2017 Chief Complaint Document Details Patient Name: [name], [name]. Date of Service: 06/22/2017 1:15 PM Medical Record Number: [id number] Patient Account Number: [id number] Date of Birth/Sex: 04-May-1945 (72 y.o. M) Treating RN: Montey Hora Primary Care Provider: Thomes Lolling Other Clinician: Referring Provider: Thomes Lolling Treating Provider/Extender: Melburn Hake, Bobbi Yount Weeks in Treatment: 63 Information Obtained from: Patient Chief Complaint Patients presents for treatment of an open diabetic ulcer to the anterior abdominal wall in the epigastric and right upper quadrant Electronic Signature(s) Signed: 06/24/2017 12:59:06 AM By: Worthy Keeler PA-C Entered By: Worthy Keeler on 06/22/2017 13:34:03 [name] ([id number]) -------------------------------------------------------------------------------- HPI Details Patient Name: [name] Date of Service: 06/22/2017 1:15 PM Medical Record Number: [id number] Patient Account Number: [id number] Date of Birth/Sex: December 17, 1945 (72 y.o. M) Treating RN: Montey Hora Primary Care Provider: Thomes Lolling Other Clinician: Referring Provider: Thomes Lolling Treating Provider/Extender: Melburn Hake, Adrea Sherpa Weeks in Treatment: 58 History of Present Illness HPI Description: 72 year old gentleman has been seen recently by his PCP Dr. Thomes Lolling, who sees him with a history of diabetes mellitus, peripheral neuropathy, B12 deficiency, hypertension, osteoarthritis and the patient had recent blood sugar checked which was 163. Past medical history includes essential hypertension, incisional hernia, diabetes mellitus, colon polyps, COPD, tobacco abuse in the past, chewing tobacco, GERD. He has also been treated for chronic back pain with no sciatica and is on oxycodone. His last hemoglobin A1c was 6.9% His abdominal surgery was done at [facility] over 15 years ago and gradually there was sutures  which protruded and then there was mesh which protruded. He washes this during his shower but does not put any dressing and the wound is covered with a lot of debris. He has never had a surgical consultation for this. 07/10/2016 -- the patient has spoken to his PCP who said he was going to call me but I have not yet had a phone conversation with the physician. From what I understand the physician was reluctant to have a surgical opinion because of the patient's several comorbidities and he feels that the surgery may be detrimental to his overall health 07/24/2016 -- the patient's daughter is at the bedside and we've had a detailed discussion regarding his options for a surgical opinion and possible surgical intervention before this becomes a emergent situation. She will seek primary care input and take the father to [facility] for a surgical opinion. 07/31/2016 -- the patient's daughter has organized for a surgical opinion at [facility] on June 14 08/14/2016 -- it has been about 3 weeks since he has stopped chewing tobacco. 08/28/2016 -- he was seen at [facility] by Dr. Roby Lofts -- the assessment was based on physical examination and review of his previous history of surgical repair. the patient had undergone a ventral hernia repair in 1998 with Dr. Quay Burow at [facility] at a prior open cholecystectomy incision. he was evaluated by Dr. Denice Paradise in 2013 and at that time it was decided to watch and wait. Dr. Payton Doughty recommended a repeat CT scan and requested records from dura and regional operative records and would follow-up the patient, after Mr. Lazalde PCP Dr. Gwenlyn Saran reviewed him for overall fitness for surgery. 09/04/16 patient's wound actually appear to be doing well on evaluation today. He is not really experience any discomfort and the right abdominal wound is dry and almost appears to be healing up. He continues to have some drainage from the left but this  seems to be more of a  potential seroma which with milking is able to be cleared out. I discussed with patient that when he performs his dressing changes at home it would be beneficial for him to do this as well. Otherwise there's no evidence of infection. He is still waiting on his CT scan and appointments to determine whether he is going to have another surgery. 09/11/2016 -- the patient has had a CT scan and is awaiting his surgical appointment prior to deciding on any surgical intervention. 09/25/16 patient presents today for fault evaluation concerning his ongoing abdominal surgical wounds. Unfortunately they do not appear to be improving significantly but rather fluctuate from a little better to little worse week by week. Obviously this is somewhat frustrating for him. 10/02/2016 -- the patient has completed his CT scan, and has a review by the surgeons at the end of August. He has a new area which has opened up a little below his left abdominal wound. 10/16/16 on evaluation today patient's wounds on the abdominal region appears to be doing about the same. He actually has an appointment with his surgeon on the 21st for evaluation regarding the required products appointment. Obviously he is having a difficult time with the feeling of these wounds. Fortunately there does not appear to be any evidence of local infection such as significant amount of erythema or streaking. She also has an audio, vomiting, or diarrhea. He is not have any discomfort at this point. [name], [name] ([id number]) 10/30/2016 -- the surgical review is tomorrow and I am awaiting the input of the surgeon. 11/16/2016 -- I had a call from his surgeon Dr. Payton Doughty, on phone number [phone] who was kind enough to call me regarding his care. After a thorough review and having consulted the abdominal wall reconstruction team they have decided that surgery will be too extensive and rather moribund for this gentleman and have asked him to continue with  local care at the wound center. She will send me an official note via Epic. 03/09/2017 -- the patient comes for some abdominal wall wounds which were caused by protruding mesh and complete surgical review was done at [facility] and no surgery has been recommended. He continues to get palliative care and the right abdominal wound is completely healed. He has a few open spots on the left which are being treated with palliative care and we see him once a month 05/04/17 on evaluation today Mr. Jallow presents for follow-up concerning his abdominal surgical ulcer. He does have some additional mashed taking out of the wound at this point although he has made a lot of progress since last time I saw him. There is good epithelialization in a lot of areas although there is still mesh poking through which is preventing complete closure. Fortunately there does not appear to be any evidence of infection. 05/18/17 on evaluation today patient appears to be doing very well in regard to his Adamo ulcer. Of the two areas that were previously open one has completely close in the proximal portion of the wound and the remainder of the opening which is the inferior portion is significantly smaller and there does not appear to be a lot of significant issue with mesh poking through the skin currently. In fact this is a nice rather circular indention where he has granulation in the base. There was some of the mesh still showing through but very little. Overall he has made significant improvements in my opinion since even  two weeks ago when I saw him. 06/22/17 on evaluation today patient's abdominal ulcer on inspection appears to be doing much better. He still has 2 tiny openings at either end of the surgical site where he has scar tissue noted at this point. Fortunately there does not appear to be any evidence of infection which is good news. He does still have some evidence of the mesh showing which still seems to be  preventing this from fully closing but fortunately this seems to be minimal even compared to my last evaluation. Overall he has been doing very well. Electronic Signature(s) Signed: 06/24/2017 12:59:06 AM By: Worthy Keeler PA-C Entered By: Worthy Keeler on 06/23/2017 23:56:45 [name] ([id number]) -------------------------------------------------------------------------------- Physical Exam Details Patient Name: ATLEE, KLUTH Date of Service: 06/22/2017 1:15 PM Medical Record Number: [id number] Patient Account Number: [id number] Date of Birth/Sex: [birth year] (72 y.o. M) Treating RN: Montey Hora Primary Care Provider: Thomes Lolling Other Clinician: Referring Provider: Thomes Lolling Treating Provider/Extender: Melburn Hake, Eleuterio Dollar Weeks in Treatment: 27 Constitutional Well-nourished and well-hydrated in no acute distress. Respiratory normal breathing without difficulty. clear to auscultation bilaterally. Cardiovascular regular rate and rhythm with normal S1, S2. Psychiatric this patient is able to make decisions and demonstrates good insight into disease process. Alert and Oriented x 3. pleasant and cooperative. Notes Patient's wound at this point she is too small granular areas where he has some of the mesh showing through but this is still very deep enough that I was not even able to trim this utilizing scissors during the evaluation today even with forceps. I just was not able to get enough space to actually perform any debridement in that regard. For that reason no sharp debridement was performed. Mechanical debridement was performed utilizing saline and gauze. Electronic Signature(s) Signed: 06/24/2017 12:59:06 AM By: Worthy Keeler PA-C Entered By: Worthy Keeler on 06/23/2017 23:58:25 [name] ([id number]) -------------------------------------------------------------------------------- Physician Orders Details Patient Name: HARGIS, VANDYNE Date of Service:  06/22/2017 1:15 PM Medical Record Number: [id number] Patient Account Number: [id number] Date of Birth/Sex: April 27, 1945 (72 y.o. M) Treating RN: Montey Hora Primary Care Provider: Thomes Lolling Other Clinician: Referring Provider: Thomes Lolling Treating Provider/Extender: Sharalyn Ink in Treatment: 98 Verbal / Phone Orders: No Diagnosis Coding ICD-10 Coding Code Description E11.622 Type 2 diabetes mellitus with other skin ulcer Unspecified open wound of abdominal wall, right upper quadrant without penetration into peritoneal S31.100A cavity, initial encounter Unspecified open wound of abdominal wall, epigastric region without penetration into peritoneal cavity, S31.102A initial encounter F17.228 Nicotine dependence, chewing tobacco, with other nicotine-induced disorders Wound Cleansing Wound #2 Left Abdomen - midline o Clean wound with Normal Saline. o May Shower, gently pat wound dry prior to applying new dressing. Skin Barriers/Peri-Wound Care Wound #2 Left Abdomen - midline o Skin Prep Primary Wound Dressing Wound #2 Left Abdomen - midline o Silver Collagen Secondary Dressing Wound #2 Left Abdomen - midline o Other - telfa island Dressing Change Frequency Wound #2 Left Abdomen - midline o Change dressing every other day. Follow-up Appointments Wound #2 Left Abdomen - midline o Return Appointment in 2 weeks. Electronic Signature(s) Signed: 06/22/2017 4:45:51 PM By: Montey Hora Signed: 06/24/2017 12:59:06 AM By: Worthy Keeler PA-C Entered By: Montey Hora on 06/22/2017 13:45:00 CEASER, EBELING ([id number]) [place], FRIEDEN ([id number]) -------------------------------------------------------------------------------- Problem List Details Patient Name: TYLYN, DERWIN Date of Service: 06/22/2017 1:15 PM Medical Record Number: [id number] Patient Account Number: [id number] Date of Birth/Sex: June 22, 1945 (72 y.o. M)  Treating RN: Montey Hora Primary Care Provider: Thomes Lolling Other Clinician: Referring Provider: Thomes Lolling Treating Provider/Extender: Sharalyn Ink in Treatment: 50 Active Problems ICD-10 Impacting Encounter Code Description Active Date Wound Healing Diagnosis E11.622 Type 2 diabetes mellitus with other skin ulcer 07/03/2016 Yes S31.100A Unspecified open wound of abdominal wall, right upper 07/03/2016 Yes quadrant without penetration into peritoneal cavity, initial encounter S31.102A Unspecified open wound of abdominal wall, epigastric region 07/03/2016 Yes without penetration into peritoneal cavity, initial encounter F17.228 Nicotine dependence, chewing tobacco, with other nicotine- 07/03/2016 Yes induced disorders Inactive Problems Resolved Problems Electronic Signature(s) Signed: 06/24/2017 12:59:06 AM By: Worthy Keeler PA-C Entered By: Worthy Keeler on 06/22/2017 13:33:57 Orihuela, Waneta Martins ([id number]) -------------------------------------------------------------------------------- Progress Note Details Patient Name: [name] Date of Service: 06/22/2017 1:15 PM Medical Record Number: [id number] Patient Account Number: [id number] Date of Birth/Sex: [birth year] (72 y.o. M) Treating RN: Montey Hora Primary Care Provider: Thomes Lolling Other Clinician: Referring Provider: Thomes Lolling Treating Provider/Extender: Melburn Hake, Vanesha Athens Weeks in Treatment: 93 Subjective Chief Complaint Information obtained from Patient Patients presents for treatment of an open diabetic ulcer to the anterior abdominal wall in the epigastric and right upper quadrant History of Present Illness (HPI) 73 year old gentleman has been seen recently by his PCP Dr. Thomes Lolling, who sees him with a history of diabetes mellitus, peripheral neuropathy, B12 deficiency, hypertension, osteoarthritis and the patient had recent blood sugar checked which was 163. Past medical history includes essential  hypertension, incisional hernia, diabetes mellitus, colon polyps, COPD, tobacco abuse in the past, chewing tobacco, GERD. He has also been treated for chronic back pain with no sciatica and is on oxycodone. His last hemoglobin A1c was 6.9% His abdominal surgery was done at [facility] over 15 years ago and gradually there was sutures which protruded and then there was mesh which protruded. He washes this during his shower but does not put any dressing and the wound is covered with a lot of debris. He has never had a surgical consultation for this. 07/10/2016 -- the patient has spoken to his PCP who said he was going to call me but I have not yet had a phone conversation with the physician. From what I understand the physician was reluctant to have a surgical opinion because of the patient's several comorbidities and he feels that the surgery may be detrimental to his overall health 07/24/2016 -- the patient's daughter is at the bedside and we've had a detailed discussion regarding his options for a surgical opinion and possible surgical intervention before this becomes a emergent situation. She will seek primary care input and take the father to [facility] for a surgical opinion. 07/31/2016 -- the patient's daughter has organized for a surgical opinion at [facility] on June 14 08/14/2016 -- it has been about 3 weeks since he has stopped chewing tobacco. 08/28/2016 -- he was seen at [facility] by Dr. Roby Lofts -- the assessment was based on physical examination and review of his previous history of surgical repair. the patient had undergone a ventral hernia repair in 1998 with Dr. Quay Burow at [facility] at a prior open cholecystectomy incision. he was evaluated by Dr. Denice Paradise in 2013 and at that time it was decided to watch and wait. Dr. Payton Doughty recommended a repeat CT scan and requested records from dura and regional operative records and would follow-up the patient, after Mr. Cambre  PCP Dr. Gwenlyn Saran reviewed him for overall fitness for surgery. 09/04/16 patient's wound  actually appear to be doing well on evaluation today. He is not really experience any discomfort and the right abdominal wound is dry and almost appears to be healing up. He continues to have some drainage from the left but this seems to be more of a potential seroma which with milking is able to be cleared out. I discussed with patient that when he performs his dressing changes at home it would be beneficial for him to do this as well. Otherwise there's no evidence of infection. He is still waiting on his CT scan and appointments to determine whether he is going to have another surgery. 09/11/2016 -- the patient has had a CT scan and is awaiting his surgical appointment prior to deciding on any surgical intervention. 09/25/16 patient presents today for fault evaluation concerning his ongoing abdominal surgical wounds. Unfortunately they do not appear to be improving significantly but rather fluctuate from a little better to little worse week by week. Obviously this is somewhat frustrating for him. 10/02/2016 -- the patient has completed his CT scan, and has a review by the surgeons at the end of August. He has a new ISAIYAH, FELDHAUS. ([id number]) area which has opened up a little below his left abdominal wound. 10/16/16 on evaluation today patient's wounds on the abdominal region appears to be doing about the same. He actually has an appointment with his surgeon on the 21st for evaluation regarding the required products appointment. Obviously he is having a difficult time with the feeling of these wounds. Fortunately there does not appear to be any evidence of local infection such as significant amount of erythema or streaking. She also has an audio, vomiting, or diarrhea. He is not have any discomfort at this point. 10/30/2016 -- the surgical review is tomorrow and I am awaiting the input of the surgeon. 11/16/2016  -- I had a call from his surgeon Dr. Payton Doughty, on phone number [phone] who was kind enough to call me regarding his care. After a thorough review and having consulted the abdominal wall reconstruction team they have decided that surgery will be too extensive and rather moribund for this gentleman and have asked him to continue with local care at the wound center. She will send me an official note via Epic. 03/09/2017 -- the patient comes for some abdominal wall wounds which were caused by protruding mesh and complete surgical review was done at [facility] and no surgery has been recommended. He continues to get palliative care and the right abdominal wound is completely healed. He has a few open spots on the left which are being treated with palliative care and we see him once a month 05/04/17 on evaluation today Mr. Labrada presents for follow-up concerning his abdominal surgical ulcer. He does have some additional mashed taking out of the wound at this point although he has made a lot of progress since last time I saw him. There is good epithelialization in a lot of areas although there is still mesh poking through which is preventing complete closure. Fortunately there does not appear to be any evidence of infection. 05/18/17 on evaluation today patient appears to be doing very well in regard to his Adamo ulcer. Of the two areas that were previously open one has completely close in the proximal portion of the wound and the remainder of the opening which is the inferior portion is significantly smaller and there does not appear to be a lot of significant issue with mesh poking through the skin  currently. In fact this is a nice rather circular indention where he has granulation in the base. There was some of the mesh still showing through but very little. Overall he has made significant improvements in my opinion since even two weeks ago when I saw him. 06/22/17 on evaluation today patient's  abdominal ulcer on inspection appears to be doing much better. He still has 2 tiny openings at either end of the surgical site where he has scar tissue noted at this point. Fortunately there does not appear to be any evidence of infection which is good news. He does still have some evidence of the mesh showing which still seems to be preventing this from fully closing but fortunately this seems to be minimal even compared to my last evaluation. Overall he has been doing very well. Patient History Information obtained from Patient. Social History Former smoker - chew tobacco, Marital Status - Widowed, Alcohol Use - Never, Drug Use - No History, Caffeine Use - Daily. 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 Respiratory The patient has no complaints or symptoms. Cardiovascular The patient has no complaints or symptoms. Psychiatric The patient has no complaints or symptoms. BRYNDAN, BILYK ([id number]) Objective Constitutional Well-nourished and well-hydrated in no acute distress. Vitals Time Taken: 1:17 PM, Height: 65 in, Weight: 161 lbs, BMI: 26.8, Temperature: 98.0 F, Pulse: 80 bpm, Respiratory Rate: 16 breaths/min, Blood Pressure: 130/65 mmHg. Respiratory normal breathing without difficulty. clear to auscultation bilaterally. Cardiovascular regular rate and rhythm with normal S1, S2. Psychiatric this patient is able to make decisions and demonstrates good insight into disease process. Alert and Oriented x 3. pleasant and cooperative. General Notes: Patient's wound at this point she is too small granular areas where he has some of the mesh showing through but this is still very deep enough that I was not even able to trim this utilizing scissors during the evaluation today even with forceps. I just was not able to get  enough space to actually perform any debridement in that regard. For that reason no sharp debridement was performed. Mechanical debridement was performed utilizing saline and gauze. Integumentary (Hair, Skin) Wound #2 status is Open. Original cause of wound was Gradually Appeared. The wound is located on the Left Abdomen - midline. The wound measures 4.4cm length x 0.4cm width x 0.1cm depth; 1.382cm^2 area and 0.138cm^3 volume. The wound is limited to skin breakdown. There is no tunneling or undermining noted. There is a medium amount of purulent drainage noted. The wound margin is flat and intact. There is large (67-100%) pink granulation within the wound bed. There is a small (1-33%) amount of necrotic tissue within the wound bed including Eschar and Adherent Slough. The periwound skin appearance exhibited: Induration, Scarring, Ecchymosis. The periwound skin appearance did not exhibit: Callus, Crepitus, Excoriation, Rash, Dry/Scaly, Maceration, Atrophie Blanche, Cyanosis, Hemosiderin Staining, Mottled, Pallor, Rubor, Erythema. Periwound temperature was noted as No Abnormality. Assessment Active Problems ICD-10 E11.622 - Type 2 diabetes mellitus with other skin ulcer S31.100A - Unspecified open wound of abdominal wall, right upper quadrant without penetration into peritoneal cavity, initial encounter S31.102A - Unspecified open wound of abdominal wall, epigastric region without penetration into peritoneal cavity, initial encounter F17.228 - Nicotine dependence, chewing tobacco, with other nicotine-induced disorders AURTHUR, WINGERTER ([id number]) Plan Wound Cleansing: Wound #2 Left Abdomen - midline: Clean wound with Normal Saline. May  Shower, gently pat wound dry prior to applying new dressing. Skin Barriers/Peri-Wound Care: Wound #2 Left Abdomen - midline: Skin Prep Primary Wound Dressing: Wound #2 Left Abdomen - midline: Silver Collagen Secondary Dressing: Wound #2 Left Abdomen -  midline: Other - telfa island Dressing Change Frequency: Wound #2 Left Abdomen - midline: Change dressing every other day. Follow-up Appointments: Wound #2 Left Abdomen - midline: Return Appointment in 2 weeks. I am going to suggest at this point that we continue with the Current wound care measures and the patient is in agreement with this plan. We will subsequently see were things stand in two weeks time. Please see above for specific wound care orders. We will see patient for re-evaluation in 1 week(s) here in the clinic. If anything worsens or changes patient will contact our office for additional recommendations. Electronic Signature(s) Signed: 06/24/2017 12:59:06 AM By: Worthy Keeler PA-C Entered By: Worthy Keeler on 06/23/2017 23:58:44 [name] ([id number]) -------------------------------------------------------------------------------- ROS/PFSH Details Patient Name: [name] Date of Service: 06/22/2017 1:15 PM Medical Record Number: [id number] Patient Account Number: [id number] Date of Birth/Sex: Jul 26, 1945 (72 y.o. M) Treating RN: Montey Hora Primary Care Provider: Thomes Lolling Other Clinician: Referring Provider: Thomes Lolling Treating Provider/Extender: Melburn Hake, Grizelda Piscopo Weeks in Treatment: 58 Information Obtained From Patient Wound History Do you currently have one or more open woundso Yes How many open wounds do you currently haveo 2 Approximately how long have you had your woundso more than 1 year How have you been treating your wound(s) until nowo open to air Has your wound(s) ever healed and then re-openedo No Have you had any lab work done in the past montho No Have you tested positive for an antibiotic resistant organism (MRSA, VRE)o No Have you tested positive for osteomyelitis (bone infection)o No Have you had any tests for circulation on your legso No Constitutional Symptoms (General Health) Complaints and Symptoms: Negative for: Fever;  Chills Eyes Medical History: Positive for: Cataracts - removed Hematologic/Lymphatic Medical History: Negative for: Anemia; Hemophilia; Human Immunodeficiency Virus; Lymphedema; Sickle Cell Disease Respiratory Complaints and Symptoms: No Complaints or Symptoms Medical History: Positive for: Chronic Obstructive Pulmonary Disease (COPD) Negative for: Aspiration; Asthma; Pneumothorax; Sleep Apnea; Tuberculosis Cardiovascular Complaints and Symptoms: No Complaints or Symptoms Medical History: Positive for: Hypertension Negative for: Angina; Arrhythmia; Congestive Heart Failure; Coronary Artery Disease; Deep Vein Thrombosis; Hypotension; Myocardial Infarction; Peripheral Arterial Disease; Peripheral Venous Disease; Phlebitis; Vasculitis Gastrointestinal TORIAN, QUINTERO ([id number]) Medical History: Negative for: Cirrhosis ; Colitis; Crohnos; Hepatitis A; Hepatitis B; Hepatitis C Endocrine Medical History: Positive for: Type II Diabetes Treated with: Oral agents Blood sugar tested every day: No Genitourinary Medical History: Negative for: End Stage Renal Disease Immunological Medical History: Negative for: Lupus Erythematosus; Raynaudos; Scleroderma Integumentary (Skin) Medical History: Negative for: History of Burn; History of pressure wounds Musculoskeletal Medical History: Positive for: Osteoarthritis Negative for: Gout; Rheumatoid Arthritis; Osteomyelitis Neurologic Medical History: Positive for: Neuropathy Negative for: Dementia; Quadriplegia; Paraplegia; Seizure Disorder Oncologic Medical History: Negative for: Received Chemotherapy; Received Radiation Past Medical History Notes: 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 Psychiatric Complaints and Symptoms: No Complaints or Symptoms Medical History: Negative for: Anorexia/bulimia; Confinement Anxiety HBO Extended History Items Eyes: Cataracts Immunizations Pneumococcal Vaccine: LAURENCE, CROFFORD  ([id number]) Received Pneumococcal Vaccination: Yes Immunization Notes: up to date Implantable Devices Family and Social History Former smoker - chew tobacco; Marital Status - Widowed; Alcohol Use: Never; Drug Use: No History; Caffeine Use: Daily; Financial Concerns: No; Food, Clothing  or Shelter Needs: No; Support System Lacking: No; Transportation Concerns: No; Advanced Directives: No; Patient does not want information on Advanced Directives Physician Affirmation I have reviewed and agree with the above information. Electronic Signature(s) Signed: 06/24/2017 12:59:06 AM By: Worthy Keeler PA-C Signed: 06/25/2017 4:05:33 PM By: Montey Hora Entered By: Worthy Keeler on 06/23/2017 23:57:27 [name] ([id number]) -------------------------------------------------------------------------------- SuperBill Details Patient Name: SIDDHANTH, DENK Date of Service: 06/22/2017 Medical Record Number: [id number] Patient Account Number: [id number] Date of Birth/Sex: February 23, 1946 (72 y.o. M) Treating RN: Montey Hora Primary Care Provider: Thomes Lolling Other Clinician: Referring Provider: Thomes Lolling Treating Provider/Extender: Melburn Hake, Jacari Iannello Weeks in Treatment: 50 Diagnosis Coding ICD-10 Codes Code Description E11.622 Type 2 diabetes mellitus with other skin ulcer Unspecified open wound of abdominal wall, right upper quadrant without penetration into peritoneal S31.100A cavity, initial encounter Unspecified open wound of abdominal wall, epigastric region without penetration into peritoneal cavity, S31.102A initial encounter F17.228 Nicotine dependence, chewing tobacco, with other nicotine-induced disorders Facility Procedures CPT4 Code: 74163845 Description: 36468 - WOUND CARE VISIT-LEV 2 EST PT Modifier: Quantity: 1 Physician Procedures CPT4: Description Modifier Quantity Code 0321224 99214 - WC PHYS LEVEL 4 - EST PT 1 ICD-10 Diagnosis Description E11.622 Type 2 diabetes  mellitus with other skin ulcer S31.100A Unspecified open wound of abdominal wall, right upper quadrant without  penetration into peritoneal cavity, initial encounter S31.102A Unspecified open wound of abdominal wall, epigastric region without penetration into peritoneal cavity, initial encounter F17.228 Nicotine dependence, chewing tobacco, with other  nicotine-induced disorders Electronic Signature(s) Signed: 06/25/2017 4:08:58 PM By: Montey Hora Signed: 06/25/2017 5:02:11 PM By: Worthy Keeler PA-C Previous Signature: 06/24/2017 12:59:06 AM Version By: Worthy Keeler PA-C Entered By: Montey Hora on 06/25/2017 16:08:58

## 2017-07-02 ENCOUNTER — Encounter: Admit: 2017-07-02 | Discharge: 2017-07-03 | Payer: MEDICARE | Attending: Internal Medicine | Primary: Internal Medicine

## 2017-07-02 DIAGNOSIS — E1142 Type 2 diabetes mellitus with diabetic polyneuropathy: Principal | ICD-10-CM

## 2017-07-02 DIAGNOSIS — M545 Low back pain: Secondary | ICD-10-CM

## 2017-07-02 DIAGNOSIS — M5090 Cervical disc disorder, unspecified, unspecified cervical region: Secondary | ICD-10-CM

## 2017-07-02 DIAGNOSIS — K5900 Constipation, unspecified: Secondary | ICD-10-CM

## 2017-07-02 DIAGNOSIS — I10 Essential (primary) hypertension: Secondary | ICD-10-CM

## 2017-07-02 DIAGNOSIS — J449 Chronic obstructive pulmonary disease, unspecified: Secondary | ICD-10-CM

## 2017-07-02 DIAGNOSIS — G8929 Other chronic pain: Secondary | ICD-10-CM

## 2017-07-02 DIAGNOSIS — Z720 Tobacco use: Secondary | ICD-10-CM

## 2017-07-02 MED ORDER — OXYCODONE-ACETAMINOPHEN 5 MG-325 MG TABLET
ORAL_TABLET | ORAL | 0 refills | 0 days | Status: CP | PRN
Start: 2017-07-02 — End: 2017-08-01

## 2017-07-02 MED ORDER — ALBUTEROL SULFATE HFA 90 MCG/ACTUATION AEROSOL INHALER
Freq: Four times a day (QID) | RESPIRATORY_TRACT | 3 refills | 0 days | Status: CP | PRN
Start: 2017-07-02 — End: 2018-09-04

## 2017-07-05 ENCOUNTER — Encounter: Admit: 2017-07-05 | Discharge: 2017-07-05 | Payer: MEDICARE

## 2017-07-05 DIAGNOSIS — Z1211 Encounter for screening for malignant neoplasm of colon: Principal | ICD-10-CM

## 2017-07-06 ENCOUNTER — Encounter: Payer: Medicare HMO | Admitting: Physician Assistant

## 2017-07-06 DIAGNOSIS — E11622 Type 2 diabetes mellitus with other skin ulcer: Secondary | ICD-10-CM | POA: Diagnosis not present

## 2017-07-10 NOTE — Progress Notes (Signed)
OCIEL, RETHERFORD ([id number]) Visit Report for 07/06/2017 Arrival Information Details Patient Name: [name], [name]. Date of Service: 07/06/2017 10:00 AM Medical Record Number: [id number] Patient Account Number: [id number] Date of Birth/Sex: 30-Aug-1945 (72 y.o. M) Treating RN: Cornell Barman Primary Care Wandalene Abrams: Thomes Lolling Other Clinician: Referring Jarret Torre: Thomes Lolling Treating [place] Noori/Extender: Sharalyn Ink in Treatment: 38 Visit Information History Since Last Visit Added or deleted any medications: No Patient Arrived: Ambulatory Any new allergies or adverse reactions: No Arrival Time: 10:34 Had a fall or experienced change in No Accompanied By: self activities of daily living that may affect Transfer Assistance: None risk of falls: Patient Identification Verified: Yes Signs or symptoms of abuse/neglect since last visito No Secondary Verification Process Completed: Yes Hospitalized since last visit: No Patient Requires Transmission-Based No Has Dressing in Place as Prescribed: Yes Precautions: Pain Present Now: No Patient Has Alerts: Yes Patient Alerts: DMII Electronic Signature(s) Signed: 07/06/2017 5:26:40 PM By: Gretta Cool, BSN, RN, CWS, Kim RN, BSN Entered By: Gretta Cool, BSN, RN, CWS, Kim on 07/06/2017 10:34:24 [name] ([id number]) -------------------------------------------------------------------------------- Clinic Level of Care Assessment Details Patient Name: [name], [name]. Date of Service: 07/06/2017 10:00 AM Medical Record Number: [id number] Patient Account Number: [id number] Date of Birth/Sex: October 04, 1945 (72 y.o. M) Treating RN: Montey Hora Primary Care Finis Hendricksen: Thomes Lolling Other Clinician: Referring Ovidio Steele: Thomes Lolling Treating Hiram Mciver/Extender: Melburn Hake, HOYT Weeks in Treatment: 7 Clinic Level of Care Assessment Items TOOL 4 Quantity Score []  - Use when only an EandM is performed on FOLLOW-UP visit 0 ASSESSMENTS - Nursing Assessment /  Reassessment X - Reassessment of Co-morbidities (includes updates in patient status) 1 10 X- 1 5 Reassessment of Adherence to Treatment Plan ASSESSMENTS - Wound and Skin Assessment / Reassessment X - Simple Wound Assessment / Reassessment - one wound 1 5 []  - 0 Complex Wound Assessment / Reassessment - multiple wounds []  - 0 Dermatologic / Skin Assessment (not related to wound area) ASSESSMENTS - Focused Assessment []  - Circumferential Edema Measurements - multi extremities 0 []  - 0 Nutritional Assessment / Counseling / Intervention []  - 0 Lower Extremity Assessment (monofilament, tuning fork, pulses) []  - 0 Peripheral Arterial Disease Assessment (using hand held doppler) ASSESSMENTS - Ostomy and/or Continence Assessment and Care []  - Incontinence Assessment and Management 0 []  - 0 Ostomy Care Assessment and Management (repouching, etc.) PROCESS - Coordination of Care X - Simple Patient / Family Education for ongoing care 1 15 []  - 0 Complex (extensive) Patient / Family Education for ongoing care []  - 0 Staff obtains Programmer, systems, Records, Test Results / Process Orders []  - 0 Staff telephones HHA, Nursing Homes / Clarify orders / etc []  - 0 Routine Transfer to another Facility (non-emergent condition) []  - 0 Routine Hospital Admission (non-emergent condition) []  - 0 New Admissions / Biomedical engineer / Ordering NPWT, Apligraf, etc. []  - 0 Emergency Hospital Admission (emergent condition) X- 1 10 Simple Discharge Coordination [name], [name] ([id number]) []  - 0 Complex (extensive) Discharge Coordination PROCESS - Special Needs []  - Pediatric / Minor Patient Management 0 []  - 0 Isolation Patient Management []  - 0 Hearing / Language / Visual special needs []  - 0 Assessment of Community assistance (transportation, D/C planning, etc.) []  - 0 Additional assistance / Altered mentation []  - 0 Support Surface(s) Assessment (bed, cushion, seat, etc.) INTERVENTIONS -  Wound Cleansing / Measurement X - Simple Wound Cleansing - one wound 1 5 []  - 0 Complex Wound Cleansing - multiple wounds X- 1 5 Wound  Imaging (photographs - any number of wounds) []  - 0 Wound Tracing (instead of photographs) X- 1 5 Simple Wound Measurement - one wound []  - 0 Complex Wound Measurement - multiple wounds INTERVENTIONS - Wound Dressings X - Small Wound Dressing one or multiple wounds 1 10 []  - 0 Medium Wound Dressing one or multiple wounds []  - 0 Large Wound Dressing one or multiple wounds []  - 0 Application of Medications - topical []  - 0 Application of Medications - injection INTERVENTIONS - Miscellaneous []  - External ear exam 0 []  - 0 Specimen Collection (cultures, biopsies, blood, body fluids, etc.) []  - 0 Specimen(s) / Culture(s) sent or taken to Lab for analysis []  - 0 Patient Transfer (multiple staff / Civil Service fast streamer / Similar devices) []  - 0 Simple Staple / Suture removal (25 or less) []  - 0 Complex Staple / Suture removal (26 or more) []  - 0 Hypo / Hyperglycemic Management (close monitor of Blood Glucose) []  - 0 Ankle / Brachial Index (ABI) - do not check if billed separately X- 1 5 Vital Signs [name], [name]. ([id number]) Has the patient been seen at the hospital within the last three years: Yes Total Score: 75 Level Of Care: New/Established - Level 2 Electronic Signature(s) Signed: 07/06/2017 4:37:07 PM By: Montey Hora Entered By: Montey Hora on 07/06/2017 10:59:51 [name] ([id number]) -------------------------------------------------------------------------------- Encounter Discharge Information Details Patient Name: [name] Date of Service: 07/06/2017 10:00 AM Medical Record Number: [id number] Patient Account Number: [id number] Date of Birth/Sex: Mar 25, 1945 (72 y.o. M) Treating RN: Ahmed Prima Primary Care Haruo Stepanek: Thomes Lolling Other Clinician: Referring Erza Mothershead: Thomes Lolling Treating Shantai Tiedeman/Extender: Melburn Hake, HOYT Weeks in Treatment: 46 Encounter Discharge Information Items Discharge Pain Level: 0 Discharge Condition: Stable Ambulatory Status: Ambulatory Discharge Destination: Home Private Transportation: Auto Accompanied By: self Schedule Follow-up Appointment: Yes Medication Reconciliation completed and provided No to Patient/Care Cynthia Stainback: Clinical Summary of Care: Electronic Signature(s) Signed: 07/06/2017 11:08:21 AM By: Alric Quan Entered By: Alric Quan on 07/06/2017 11:08:21 [name] ([id number]) -------------------------------------------------------------------------------- Lower Extremity Assessment Details Patient Name: [name] Date of Service: 07/06/2017 10:00 AM Medical Record Number: [id number] Patient Account Number: [id number] Date of Birth/Sex: 04-17-1945 (72 y.o. M) Treating RN: Cornell Barman Primary Care Lorena Benham: Thomes Lolling Other Clinician: Referring Soni Kegel: Thomes Lolling Treating Skyley Grandmaison/Extender: Sharalyn Ink in Treatment: 53 Electronic Signature(s) Signed: 07/06/2017 5:26:40 PM By: Gretta Cool, BSN, RN, CWS, Kim RN, BSN Entered By: Gretta Cool, BSN, RN, CWS, Kim on 07/06/2017 10:40:20 [name], [name] ([id number]) -------------------------------------------------------------------------------- Multi Wound Chart Details Patient Name: [name] Date of Service: 07/06/2017 10:00 AM Medical Record Number: [id number] Patient Account Number: [id number] Date of Birth/Sex: 11-16-1945 (73 y.o. M) Treating RN: Montey Hora Primary Care Dazja Houchin: Thomes Lolling Other Clinician: Referring Elior Robinette: Thomes Lolling Treating Lou Loewe/Extender: Melburn Hake, HOYT Weeks in Treatment: 45 Vital Signs Height(in): 65 Pulse(bpm): 75 Weight(lbs): 161 Blood Pressure(mmHg): 136/70 Body Mass Index(BMI): 27 Temperature(F): 97.5 Respiratory Rate 16 (breaths/min): Photos: [2:No Photos] [N/A:N/A] Wound Location: [2:Left Abdomen - midline]  [N/A:N/A] Wounding Event: [2:Gradually Appeared] [N/A:N/A] Primary Etiology: [2:Atypical] [N/A:N/A] Comorbid History: [2:Cataracts, Chronic Obstructive N/A Pulmonary Disease (COPD), Hypertension, Type II Diabetes, Osteoarthritis, Neuropathy] Date Acquired: [2:05/17/2015] [N/A:N/A] Weeks of Treatment: [2:52] [N/A:N/A] Wound Status: [2:Open] [N/A:N/A] Clustered Wound: [2:Yes] [N/A:N/A] Clustered Quantity: [2:1] [N/A:N/A] Measurements L x W x D [2:0.5x0.7x0.1] [N/A:N/A] (cm) Area (cm) : [2:0.275] [N/A:N/A] Volume (cm) : [2:0.027] [N/A:N/A] % Reduction in Area: [2:94.80%] [N/A:N/A] % Reduction in Volume: [2:94.90%] [N/A:N/A] Classification: [2:Full Thickness Without Exposed  Support Structures] [N/A:N/A] Exudate Amount: [2:Medium] [N/A:N/A] Exudate Type: [2:Purulent] [N/A:N/A] Exudate Color: [2:yellow, Steel, green] [N/A:N/A] Wound Margin: [2:Flat and Intact] [N/A:N/A] Granulation Amount: [2:Large (67-100%)] [N/A:N/A] Granulation Quality: [2:Pink] [N/A:N/A] Necrotic Amount: [2:Small (1-33%)] [N/A:N/A] Necrotic Tissue: [2:Eschar, Adherent Slough] [N/A:N/A] Exposed Structures: [2:Fascia: No Fat Layer (Subcutaneous Tissue) Exposed: No Tendon: No Muscle: No Joint: No] [N/A:N/A] Bone: No Limited to Skin Breakdown Epithelialization: Large (67-100%) N/A N/A Periwound Skin Texture: Induration: Yes N/A N/A Scarring: Yes Excoriation: No Callus: No Crepitus: No Rash: No Periwound Skin Moisture: Maceration: No N/A N/A Dry/Scaly: No Periwound Skin Color: Ecchymosis: Yes N/A N/A Atrophie Blanche: No Cyanosis: No Erythema: No Hemosiderin Staining: No Mottled: No Pallor: No Rubor: No Temperature: No Abnormality N/A N/A Tenderness on Palpation: No N/A N/A Wound Preparation: Ulcer Cleansing: N/A N/A Rinsed/Irrigated with Saline Topical Anesthetic Applied: Other: lidocaine 4% Treatment Notes Electronic Signature(s) Signed: 07/06/2017 4:37:07 PM By: Montey Hora Entered By:  Montey Hora on 07/06/2017 10:58:15 [name] ([id number]) -------------------------------------------------------------------------------- [place] Details Patient Name: [name], [name] Date of Service: 07/06/2017 10:00 AM Medical Record Number: [id number] Patient Account Number: [id number] Date of Birth/Sex: 1945-11-19 (72 y.o. M) Treating RN: Montey Hora Primary Care TRUE Garciamartinez: Thomes Lolling Other Clinician: Referring Nasim Habeeb: Thomes Lolling Treating Mylee Falin/Extender: Melburn Hake, HOYT Weeks in Treatment: 29 Active Inactive ` Abuse / Safety / Falls / Self Care Management Nursing Diagnoses: Impaired physical mobility Goals: Patient will remain injury free Date Initiated: 07/03/2016 Target Resolution Date: 09/14/2016 Goal Status: Active Interventions: Assess fall risk on admission and as needed Notes: ` Orientation to the Wound Care Program Nursing Diagnoses: Knowledge deficit related to the wound healing center program Goals: Patient/caregiver will verbalize understanding of the [place] Date Initiated: 07/03/2016 Target Resolution Date: 09/15/2016 Goal Status: Active Interventions: Provide education on orientation to the wound center Notes: ` Wound/Skin Impairment Nursing Diagnoses: Impaired tissue integrity Goals: Patient/caregiver will verbalize understanding of skin care regimen Date Initiated: 07/03/2016 Target Resolution Date: 09/15/2016 Goal Status: Active Ulcer/skin breakdown will have a volume reduction of 30% by week 4 Date Initiated: 07/03/2016 Target Resolution Date: 09/15/2016 [name], [name] ([id number]) Goal Status: Active Ulcer/skin breakdown will have a volume reduction of 50% by week 8 Date Initiated: 07/03/2016 Target Resolution Date: 09/15/2016 Goal Status: Active Ulcer/skin breakdown will have a volume reduction of 80% by week 12 Date Initiated: 07/03/2016 Target Resolution Date: 09/15/2016 Goal Status:  Active Ulcer/skin breakdown will heal within 14 weeks Date Initiated: 07/03/2016 Target Resolution Date: 09/15/2016 Goal Status: Active Interventions: Assess patient/caregiver ability to obtain necessary supplies Assess patient/caregiver ability to perform ulcer/skin care regimen upon admission and as needed Assess ulceration(s) every visit Notes: Electronic Signature(s) Signed: 07/06/2017 4:37:07 PM By: Montey Hora Entered By: Montey Hora on 07/06/2017 10:58:06 [name] ([id number]) -------------------------------------------------------------------------------- Pain Assessment Details Patient Name: [name] Date of Service: 07/06/2017 10:00 AM Medical Record Number: [id number] Patient Account Number: [id number] Date of Birth/Sex: 1945-10-21 (72 y.o. M) Treating RN: Cornell Barman Primary Care Sophira Rumler: Thomes Lolling Other Clinician: Referring Jalia Zuniga: Thomes Lolling Treating Carlon Davidson/Extender: Sharalyn Ink in Treatment: 25 Active Problems Location of Pain Severity and Description of Pain Patient Has Paino No Site Locations Pain Management and Medication Current Pain Management: Electronic Signature(s) Signed: 07/06/2017 5:26:40 PM By: Gretta Cool, BSN, RN, CWS, Kim RN, BSN Entered By: Gretta Cool, BSN, RN, CWS, Kim on 07/06/2017 10:34:31 [name] ([id number]) -------------------------------------------------------------------------------- Patient/Caregiver Education Details Patient Name: [name] Date of Service: 07/06/2017 10:00 AM Medical Record Number:  [id number] Patient Account Number: [id number] Date of Birth/Gender: 1945-09-02 (72 y.o. M) Treating RN: Ahmed Prima Primary Care Physician: Thomes Lolling Other Clinician: Referring Physician: Thomes Lolling Treating Physician/Extender: Sharalyn Ink in Treatment: 87 Education Assessment Education Provided To: Patient Education Topics Provided Wound/Skin Impairment: Handouts: Caring for  Your Ulcer, Other: change dressing as ordered Methods: Demonstration, Explain/Verbal Responses: State content correctly Electronic Signature(s) Signed: 07/06/2017 4:29:16 PM By: Alric Quan Entered By: Alric Quan on 07/06/2017 11:08:42 [name] ([id number]) -------------------------------------------------------------------------------- Wound Assessment Details Patient Name: [name] Date of Service: 07/06/2017 10:00 AM Medical Record Number: [id number] Patient Account Number: [id number] Date of Birth/Sex: 16-Mar-1945 (72 y.o. M) Treating RN: Cornell Barman Primary Care Orlanda Frankum: Thomes Lolling Other Clinician: Referring Nami Strawder: Thomes Lolling Treating Jayen Bromwell/Extender: Melburn Hake, HOYT Weeks in Treatment: 52 Wound Status Wound Number: 2 Primary Atypical Etiology: Wound Location: Left Abdomen - midline Wound Open Wounding Event: Gradually Appeared Status: Date Acquired: 05/17/2015 Comorbid Cataracts, Chronic Obstructive Pulmonary Weeks Of Treatment: 52 History: Disease (COPD), Hypertension, Type II Clustered Wound: Yes Diabetes, Osteoarthritis, Neuropathy Photos Photo Uploaded By: Alric Quan on 07/06/2017 16:12:30 Wound Measurements Length: (cm) 0.5 Width: (cm) 0.7 Depth: (cm) 0.1 Clustered Quantity: 1 Area: (cm) 0.275 Volume: (cm) 0.027 % Reduction in Area: 94.8% % Reduction in Volume: 94.9% Epithelialization: Large (67-100%) Tunneling: No Undermining: No Wound Description Full Thickness Without Exposed Support Classification: Structures Wound Margin: Flat and Intact Exudate Medium Amount: Exudate Type: Purulent Exudate Color: yellow, Stclair, green Foul Odor After Cleansing: No Slough/Fibrino No Wound Bed Granulation Amount: Large (67-100%) Exposed Structure Granulation Quality: Pink Fascia Exposed: No Necrotic Amount: Small (1-33%) Fat Layer (Subcutaneous Tissue) Exposed: No Necrotic Quality: Eschar, Adherent Slough Tendon  Exposed: No Muscle Exposed: No Joint Exposed: No Bone Exposed: No Limited to Skin Breakdown [name], [name] ([id number]) Periwound Skin Texture Texture Color No Abnormalities Noted: No No Abnormalities Noted: No Callus: No Atrophie Blanche: No Crepitus: No Cyanosis: No Excoriation: No Ecchymosis: Yes Induration: Yes Erythema: No Rash: No Hemosiderin Staining: No Scarring: Yes Mottled: No Pallor: No Moisture Rubor: No No Abnormalities Noted: No Dry / Scaly: No Temperature / Pain Maceration: No Temperature: No Abnormality Wound Preparation Ulcer Cleansing: Rinsed/Irrigated with Saline Topical Anesthetic Applied: Other: lidocaine 4%, Treatment Notes Wound #2 (Left Abdomen - midline) 1. Cleansed with: Clean wound with Normal Saline 2. Anesthetic Topical Lidocaine 4% cream to wound bed prior to debridement 3. Peri-wound Care: Skin Prep 4. Dressing Applied: Prisma Ag 5. Secondary [place] Signature(s) Signed: 07/06/2017 5:26:40 PM By: Gretta Cool, BSN, RN, CWS, Kim RN, BSN Entered By: Gretta Cool, BSN, RN, CWS, Kim on 07/06/2017 10:38:22 [name], [name] ([id number]) -------------------------------------------------------------------------------- [place] Details Patient Name: [name], [name] Date of Service: 07/06/2017 10:00 AM Medical Record Number: [id number] Patient Account Number: [id number] Date of Birth/Sex: 1946-01-27 (72 y.o. M) Treating RN: Cornell Barman Primary Care Riki Berninger: Thomes Lolling Other Clinician: Referring Yianni Skilling: Thomes Lolling Treating Chike Farrington/Extender: Melburn Hake, HOYT Weeks in Treatment: 52 Vital Signs Time Taken: 10:34 Temperature (F): 97.5 Height (in): 65 Pulse (bpm): 75 Weight (lbs): 161 Respiratory Rate (breaths/min): 16 Body Mass Index (BMI): 26.8 Blood Pressure (mmHg): 136/70 Reference Range: 80 - 120 mg / dl Electronic Signature(s) Signed: 07/06/2017 5:26:40 PM By: Gretta Cool, BSN, RN, CWS, Kim RN, BSN Entered By:  Gretta Cool, BSN, RN, CWS, Kim on 07/06/2017 10:34:49

## 2017-07-12 NOTE — Progress Notes (Signed)
Michael, Jackson ([id number]) Visit Report for 07/06/2017 Chief Complaint Document Details Patient Name: [name], [name]. Date of Service: 07/06/2017 10:00 AM Medical Record Number: [id number] Patient Account Number: [id number] Date of Birth/Sex: [birth year] (72 y.o. M) Treating RN: Montey Hora Primary Care Provider: Thomes Lolling Other Clinician: Referring Provider: Thomes Lolling Treating Provider/Extender: Sharalyn Ink in Treatment: 69 Information Obtained from: Patient Chief Complaint Patients presents for treatment of an open diabetic ulcer to the anterior abdominal wall in the epigastric and right upper quadrant Electronic Signature(s) Signed: 07/06/2017 5:37:52 PM By: Worthy Keeler PA-C Entered By: Worthy Keeler on 07/06/2017 10:40:46 [name] ([id number]) -------------------------------------------------------------------------------- HPI Details Patient Name: [name] Date of Service: 07/06/2017 10:00 AM Medical Record Number: [id number] Patient Account Number: [id number] Date of Birth/Sex: [birth year] (72 y.o. M) Treating RN: Montey Hora Primary Care Provider: Thomes Lolling Other Clinician: Referring Provider: Thomes Lolling Treating Provider/Extender: Melburn Hake, Keera Altidor Weeks in Treatment: 24 History of Present Illness HPI Description: 72 year old gentleman has been seen recently by his PCP Dr. Thomes Lolling, who sees him with a history of diabetes mellitus, peripheral neuropathy, B12 deficiency, hypertension, osteoarthritis and the patient had recent blood sugar checked which was 163. Past medical history includes essential hypertension, incisional hernia, diabetes mellitus, colon polyps, COPD, tobacco abuse in the past, chewing tobacco, GERD. He has also been treated for chronic back pain with no sciatica and is on oxycodone. His last hemoglobin A1c was 6.9% His abdominal surgery was done at [facility] over 15 years ago and gradually there was  sutures which protruded and then there was mesh which protruded. He washes this during his shower but does not put any dressing and the wound is covered with a lot of debris. He has never had a surgical consultation for this. 07/10/2016 -- the patient has spoken to his PCP who said he was going to call me but I have not yet had a phone conversation with the physician. From what I understand the physician was reluctant to have a surgical opinion because of the patient's several comorbidities and he feels that the surgery may be detrimental to his overall health 07/24/2016 -- the patient's daughter is at the bedside and we've had a detailed discussion regarding his options for a surgical opinion and possible surgical intervention before this becomes a emergent situation. She will seek primary care input and take the father to [facility] for a surgical opinion. 07/31/2016 -- the patient's daughter has organized for a surgical opinion at [facility] on June 14 08/14/2016 -- it has been about 3 weeks since he has stopped chewing tobacco. 08/28/2016 -- he was seen at [facility] by Dr. Roby Lofts -- the assessment was based on physical examination and review of his previous history of surgical repair. the patient had undergone a ventral hernia repair in 1998 with Dr. Quay Burow at [facility] at a prior open cholecystectomy incision. he was evaluated by Dr. Denice Paradise in 2013 and at that time it was decided to watch and wait. Dr. Payton Doughty recommended a repeat CT scan and requested records from dura and regional operative records and would follow-up the patient, after Mr. Reichardt PCP Dr. Gwenlyn Saran reviewed him for overall fitness for surgery. 09/04/16 patient's wound actually appear to be doing well on evaluation today. He is not really experience any discomfort and the right abdominal wound is dry and almost appears to be healing up. He continues to have some drainage from the left but this  seems to be more  of a potential seroma which with milking is able to be cleared out. I discussed with patient that when he performs his dressing changes at home it would be beneficial for him to do this as well. Otherwise there's no evidence of infection. He is still waiting on his CT scan and appointments to determine whether he is going to have another surgery. 09/11/2016 -- the patient has had a CT scan and is awaiting his surgical appointment prior to deciding on any surgical intervention. 09/25/16 patient presents today for fault evaluation concerning his ongoing abdominal surgical wounds. Unfortunately they do not appear to be improving significantly but rather fluctuate from a little better to little worse week by week. Obviously this is somewhat frustrating for him. 10/02/2016 -- the patient has completed his CT scan, and has a review by the surgeons at the end of August. He has a new area which has opened up a little below his left abdominal wound. 10/16/16 on evaluation today patient's wounds on the abdominal region appears to be doing about the same. He actually has an appointment with his surgeon on the 21st for evaluation regarding the required products appointment. Obviously he is having a difficult time with the feeling of these wounds. Fortunately there does not appear to be any evidence of local infection such as significant amount of erythema or streaking. She also has an audio, vomiting, or diarrhea. He is not have any discomfort at this point. [name], [name] ([id number]) 10/30/2016 -- the surgical review is tomorrow and I am awaiting the input of the surgeon. 11/16/2016 -- I had a call from his surgeon Dr. Payton Doughty, on phone number [phone] who was kind enough to call me regarding his care. After a thorough review and having consulted the abdominal wall reconstruction team they have decided that surgery will be too extensive and rather moribund for this gentleman and have asked him to continue  with local care at the wound center. She will send me an official note via Epic. 03/09/2017 -- the patient comes for some abdominal wall wounds which were caused by protruding mesh and complete surgical review was done at [facility] and no surgery has been recommended. He continues to get palliative care and the right abdominal wound is completely healed. He has a few open spots on the left which are being treated with palliative care and we see him once a month 05/04/17 on evaluation today Mr. Oyama presents for follow-up concerning his abdominal surgical ulcer. He does have some additional mashed taking out of the wound at this point although he has made a lot of progress since last time I saw him. There is good epithelialization in a lot of areas although there is still mesh poking through which is preventing complete closure. Fortunately there does not appear to be any evidence of infection. 05/18/17 on evaluation today patient appears to be doing very well in regard to his Adamo ulcer. Of the two areas that were previously open one has completely close in the proximal portion of the wound and the remainder of the opening which is the inferior portion is significantly smaller and there does not appear to be a lot of significant issue with mesh poking through the skin currently. In fact this is a nice rather circular indention where he has granulation in the base. There was some of the mesh still showing through but very little. Overall he has made significant improvements in my opinion since even  two weeks ago when I saw him. 06/22/17 on evaluation today patient's abdominal ulcer on inspection appears to be doing much better. He still has 2 tiny openings at either end of the surgical site where he has scar tissue noted at this point. Fortunately there does not appear to be any evidence of infection which is good news. He does still have some evidence of the mesh showing which still seems  to be preventing this from fully closing but fortunately this seems to be minimal even compared to my last evaluation. Overall he has been doing very well. 07/06/17 on evaluation today patient's abdominal ulcer actually appears to be doing much better. He's showing signs of improvement little by little. Overall I feel this is appropriate as far as the progression that has been made. With that being said it's still been a very slow process. There's no evidence of infection. Electronic Signature(s) Signed: 07/06/2017 5:37:52 PM By: Worthy Keeler PA-C Entered By: Worthy Keeler on 07/06/2017 17:07:56 [name] ([id number]) -------------------------------------------------------------------------------- Physical Exam Details Patient Name: [name], [name] Date of Service: 07/06/2017 10:00 AM Medical Record Number: [id number] Patient Account Number: [id number] Date of Birth/Sex: 04-26-1945 (72 y.o. M) Treating RN: Montey Hora Primary Care Provider: Thomes Lolling Other Clinician: Referring Provider: Thomes Lolling Treating Provider/Extender: Melburn Hake, Vinicius Brockman Weeks in Treatment: 94 Constitutional Well-nourished and well-hydrated in no acute distress. Respiratory normal breathing without difficulty. clear to auscultation bilaterally. Cardiovascular regular rate and rhythm with normal S1, S2. Psychiatric this patient is able to make decisions and demonstrates good insight into disease process. Alert and Oriented x 3. pleasant and cooperative. Electronic Signature(s) Signed: 07/06/2017 5:37:52 PM By: Worthy Keeler PA-C Entered By: Worthy Keeler on 07/06/2017 17:08:37 [name] ([id number]) -------------------------------------------------------------------------------- Physician Orders Details Patient Name: [name], [name] Date of Service: 07/06/2017 10:00 AM Medical Record Number: [id number] Patient Account Number: [id number] Date of Birth/Sex: Sep 27, 1945 (72 y.o. M) Treating  RN: Montey Hora Primary Care Provider: Thomes Lolling Other Clinician: Referring Provider: Thomes Lolling Treating Provider/Extender: Sharalyn Ink in Treatment: 14 Verbal / Phone Orders: No Diagnosis Coding ICD-10 Coding Code Description E11.622 Type 2 diabetes mellitus with other skin ulcer Unspecified open wound of abdominal wall, right upper quadrant without penetration into peritoneal S31.100A cavity, initial encounter Unspecified open wound of abdominal wall, epigastric region without penetration into peritoneal cavity, S31.102A initial encounter F17.228 Nicotine dependence, chewing tobacco, with other nicotine-induced disorders Wound Cleansing Wound #2 Left Abdomen - midline o Clean wound with Normal Saline. o May Shower, gently pat wound dry prior to applying new dressing. Skin Barriers/Peri-Wound Care Wound #2 Left Abdomen - midline o Skin Prep Primary Wound Dressing Wound #2 Left Abdomen - midline o Silver Collagen Secondary Dressing Wound #2 Left Abdomen - midline o Other - telfa island Dressing Change Frequency Wound #2 Left Abdomen - midline o Change dressing every other day. Follow-up Appointments Wound #2 Left Abdomen - midline o Return Appointment in 2 weeks. Electronic Signature(s) Signed: 07/06/2017 4:37:07 PM By: Montey Hora Signed: 07/06/2017 5:37:52 PM By: Worthy Keeler PA-C Entered By: Montey Hora on 07/06/2017 10:59:26 [name], [name] ([id number], [name] ([id number]) -------------------------------------------------------------------------------- Problem List Details Patient Name: [name], [name] Date of Service: 07/06/2017 10:00 AM Medical Record Number: [id number] Patient Account Number: [id number] Date of Birth/Sex: 1945/04/20 (71 y.o. M) Treating RN: Montey Hora Primary Care Provider: Thomes Lolling Other Clinician: Referring Provider: Thomes Lolling Treating Provider/Extender: Melburn Hake, Emari Demmer Weeks in  Treatment: 39 Active  Problems ICD-10 Impacting Encounter Code Description Active Date Wound Healing Diagnosis E11.622 Type 2 diabetes mellitus with other skin ulcer 07/03/2016 Yes S31.100A Unspecified open wound of abdominal wall, right upper 07/03/2016 Yes quadrant without penetration into peritoneal cavity, initial encounter S31.102A Unspecified open wound of abdominal wall, epigastric region 07/03/2016 Yes without penetration into peritoneal cavity, initial encounter F17.228 Nicotine dependence, chewing tobacco, with other nicotine- 07/03/2016 Yes induced disorders Inactive Problems Resolved Problems Electronic Signature(s) Signed: 07/06/2017 5:37:52 PM By: Worthy Keeler PA-C Entered By: Worthy Keeler on 07/06/2017 10:40:38 [name] ([id number]) -------------------------------------------------------------------------------- Progress Note Details Patient Name: [name] Date of Service: 07/06/2017 10:00 AM Medical Record Number: [id number] Patient Account Number: [id number] Date of Birth/Sex: [birth year] (72 y.o. M) Treating RN: Montey Hora Primary Care Provider: Thomes Lolling Other Clinician: Referring Provider: Thomes Lolling Treating Provider/Extender: Sharalyn Ink in Treatment: 67 Subjective Chief Complaint Information obtained from Patient Patients presents for treatment of an open diabetic ulcer to the anterior abdominal wall in the epigastric and right upper quadrant History of Present Illness (HPI) 72 year old gentleman has been seen recently by his PCP Dr. Thomes Lolling, who sees him with a history of diabetes mellitus, peripheral neuropathy, B12 deficiency, hypertension, osteoarthritis and the patient had recent blood sugar checked which was 163. Past medical history includes essential hypertension, incisional hernia, diabetes mellitus, colon polyps, COPD, tobacco abuse in the past, chewing tobacco, GERD. He has also been treated for chronic back  pain with no sciatica and is on oxycodone. His last hemoglobin A1c was 6.9% His abdominal surgery was done at [facility] over 15 years ago and gradually there was sutures which protruded and then there was mesh which protruded. He washes this during his shower but does not put any dressing and the wound is covered with a lot of debris. He has never had a surgical consultation for this. 07/10/2016 -- the patient has spoken to his PCP who said he was going to call me but I have not yet had a phone conversation with the physician. From what I understand the physician was reluctant to have a surgical opinion because of the patient's several comorbidities and he feels that the surgery may be detrimental to his overall health 07/24/2016 -- the patient's daughter is at the bedside and we've had a detailed discussion regarding his options for a surgical opinion and possible surgical intervention before this becomes a emergent situation. She will seek primary care input and take the father to [facility] for a surgical opinion. 07/31/2016 -- the patient's daughter has organized for a surgical opinion at [facility] on June 14 08/14/2016 -- it has been about 3 weeks since he has stopped chewing tobacco. 08/28/2016 -- he was seen at [facility] by Dr. Roby Lofts -- the assessment was based on physical examination and review of his previous history of surgical repair. the patient had undergone a ventral hernia repair in 1998 with Dr. Quay Burow at [facility] at a prior open cholecystectomy incision. he was evaluated by Dr. Denice Paradise in 2013 and at that time it was decided to watch and wait. Dr. Payton Doughty recommended a repeat CT scan and requested records from dura and regional operative records and would follow-up the patient, after Mr. Palos PCP Dr. Gwenlyn Saran reviewed him for overall fitness for surgery. 09/04/16 patient's wound actually appear to be doing well on evaluation today. He is not really  experience any discomfort and the right abdominal wound is dry and almost appears  to be healing up. He continues to have some drainage from the left but this seems to be more of a potential seroma which with milking is able to be cleared out. I discussed with patient that when he performs his dressing changes at home it would be beneficial for him to do this as well. Otherwise there's no evidence of infection. He is still waiting on his CT scan and appointments to determine whether he is going to have another surgery. 09/11/2016 -- the patient has had a CT scan and is awaiting his surgical appointment prior to deciding on any surgical intervention. 09/25/16 patient presents today for fault evaluation concerning his ongoing abdominal surgical wounds. Unfortunately they do not appear to be improving significantly but rather fluctuate from a little better to little worse week by week. Obviously this is somewhat frustrating for him. 10/02/2016 -- the patient has completed his CT scan, and has a review by the surgeons at the end of August. He has a new [name], [name]. ([id number]) area which has opened up a little below his left abdominal wound. 10/16/16 on evaluation today patient's wounds on the abdominal region appears to be doing about the same. He actually has an appointment with his surgeon on the 21st for evaluation regarding the required products appointment. Obviously he is having a difficult time with the feeling of these wounds. Fortunately there does not appear to be any evidence of local infection such as significant amount of erythema or streaking. She also has an audio, vomiting, or diarrhea. He is not have any discomfort at this point. 10/30/2016 -- the surgical review is tomorrow and I am awaiting the input of the surgeon. 11/16/2016 -- I had a call from his surgeon Dr. Payton Doughty, on phone number [phone] who was kind enough to call me regarding his care. After a thorough review and  having consulted the abdominal wall reconstruction team they have decided that surgery will be too extensive and rather moribund for this gentleman and have asked him to continue with local care at the wound center. She will send me an official note via Epic. 03/09/2017 -- the patient comes for some abdominal wall wounds which were caused by protruding mesh and complete surgical review was done at [facility] and no surgery has been recommended. He continues to get palliative care and the right abdominal wound is completely healed. He has a few open spots on the left which are being treated with palliative care and we see him once a month 05/04/17 on evaluation today Mr. Arko presents for follow-up concerning his abdominal surgical ulcer. He does have some additional mashed taking out of the wound at this point although he has made a lot of progress since last time I saw him. There is good epithelialization in a lot of areas although there is still mesh poking through which is preventing complete closure. Fortunately there does not appear to be any evidence of infection. 05/18/17 on evaluation today patient appears to be doing very well in regard to his Adamo ulcer. Of the two areas that were previously open one has completely close in the proximal portion of the wound and the remainder of the opening which is the inferior portion is significantly smaller and there does not appear to be a lot of significant issue with mesh poking through the skin currently. In fact this is a nice rather circular indention where he has granulation in the base. There was some of the mesh still showing through  but very little. Overall he has made significant improvements in my opinion since even two weeks ago when I saw him. 06/22/17 on evaluation today patient's abdominal ulcer on inspection appears to be doing much better. He still has 2 tiny openings at either end of the surgical site where he has scar tissue  noted at this point. Fortunately there does not appear to be any evidence of infection which is good news. He does still have some evidence of the mesh showing which still seems to be preventing this from fully closing but fortunately this seems to be minimal even compared to my last evaluation. Overall he has been doing very well. 07/06/17 on evaluation today patient's abdominal ulcer actually appears to be doing much better. He's showing signs of improvement little by little. Overall I feel this is appropriate as far as the progression that has been made. With that being said it's still been a very slow process. There's no evidence of infection. Patient History Information obtained from Patient. Social History Former smoker - chew tobacco, Marital Status - Widowed, Alcohol Use - Never, Drug Use - No History, Caffeine Use - Daily. 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 Respiratory The patient has no complaints or symptoms. Cardiovascular The patient has no complaints or symptoms. [name], [name] ([id number]) Psychiatric The patient has no complaints or symptoms. Objective Constitutional Well-nourished and well-hydrated in no acute distress. Vitals Time Taken: 10:34 AM, Height: 65 in, Weight: 161 lbs, BMI: 26.8, Temperature: 97.5 F, Pulse: 75 bpm, Respiratory Rate: 16 breaths/min, Blood Pressure: 136/70 mmHg. Respiratory normal breathing without difficulty. clear to auscultation bilaterally. Cardiovascular regular rate and rhythm with normal S1, S2. Psychiatric this patient is able to make decisions and demonstrates good insight into disease process. Alert and Oriented x 3. pleasant and cooperative. Integumentary (Hair, Skin) Wound #2 status is Open. Original cause of wound was Gradually Appeared. The wound is  located on the Left Abdomen - midline. The wound measures 0.5cm length x 0.7cm width x 0.1cm depth; 0.275cm^2 area and 0.027cm^3 volume. The wound is limited to skin breakdown. There is no tunneling or undermining noted. There is a medium amount of purulent drainage noted. The wound margin is flat and intact. There is large (67-100%) pink granulation within the wound bed. There is a small (1-33%) amount of necrotic tissue within the wound bed including Eschar and Adherent Slough. The periwound skin appearance exhibited: Induration, Scarring, Ecchymosis. The periwound skin appearance did not exhibit: Callus, Crepitus, Excoriation, Rash, Dry/Scaly, Maceration, Atrophie Blanche, Cyanosis, Hemosiderin Staining, Mottled, Pallor, Rubor, Erythema. Periwound temperature was noted as No Abnormality. Assessment Active Problems ICD-10 E11.622 - Type 2 diabetes mellitus with other skin ulcer S31.100A - Unspecified open wound of abdominal wall, right upper quadrant without penetration into peritoneal cavity, initial encounter S31.102A - Unspecified open wound of abdominal wall, epigastric region without penetration into peritoneal cavity, initial encounter F17.228 - Nicotine dependence, chewing tobacco, with other nicotine-induced disorders [name], [name] ([id number]) Plan Wound Cleansing: Wound #2 Left Abdomen - midline: Clean wound with Normal Saline. May Shower, gently pat wound dry prior to applying new dressing. Skin Barriers/Peri-Wound Care: Wound #2 Left Abdomen - midline: Skin Prep Primary Wound Dressing: Wound #2 Left Abdomen - midline: Silver Collagen Secondary Dressing: Wound #2 Left Abdomen - midline: Other - telfa island Dressing Change Frequency: Wound #2 Left Abdomen - midline:  Change dressing every other day. Follow-up Appointments: Wound #2 Left Abdomen - midline: Return Appointment in 2 weeks. We will continue with the Prisma for the next week we will see were things stand  in two weeks time we see him for reevaluation. Patient is in agreement with this plan. Please see above for specific wound care orders. We will see patient for re-evaluation in 2 week(s) here in the clinic. If anything worsens or changes patient will contact our office for additional recommendations. Electronic Signature(s) Signed: 07/06/2017 5:37:52 PM By: Worthy Keeler PA-C Entered By: Worthy Keeler on 07/06/2017 17:09:07 [name] ([id number]) -------------------------------------------------------------------------------- ROS/PFSH Details Patient Name: [name] Date of Service: 07/06/2017 10:00 AM Medical Record Number: [id number] Patient Account Number: [id number] Date of Birth/Sex: [birth year] (72 y.o. M) Treating RN: Montey Hora Primary Care Provider: Thomes Lolling Other Clinician: Referring Provider: Thomes Lolling Treating Provider/Extender: Melburn Hake, Dorthia Tout Weeks in Treatment: 14 Information Obtained From Patient Wound History Do you currently have one or more open woundso Yes How many open wounds do you currently haveo 2 Approximately how long have you had your woundso more than 1 year How have you been treating your wound(s) until nowo open to air Has your wound(s) ever healed and then re-openedo No Have you had any lab work done in the past montho No Have you tested positive for an antibiotic resistant organism (MRSA, VRE)o No Have you tested positive for osteomyelitis (bone infection)o No Have you had any tests for circulation on your legso No Constitutional Symptoms (General Health) Complaints and Symptoms: Negative for: Fever; Chills Eyes Medical History: Positive for: Cataracts - removed Hematologic/Lymphatic Medical History: Negative for: Anemia; Hemophilia; Human Immunodeficiency Virus; Lymphedema; Sickle Cell Disease Respiratory Complaints and Symptoms: No Complaints or Symptoms Medical History: Positive for: Chronic Obstructive Pulmonary  Disease (COPD) Negative for: Aspiration; Asthma; Pneumothorax; Sleep Apnea; Tuberculosis Cardiovascular Complaints and Symptoms: No Complaints or Symptoms Medical History: Positive for: Hypertension Negative for: Angina; Arrhythmia; Congestive Heart Failure; Coronary Artery Disease; Deep Vein Thrombosis; Hypotension; Myocardial Infarction; Peripheral Arterial Disease; Peripheral Venous Disease; Phlebitis; Vasculitis Gastrointestinal [name], [name] ([id number]) Medical History: Negative for: Cirrhosis ; Colitis; Crohnos; Hepatitis A; Hepatitis B; Hepatitis C Endocrine Medical History: Positive for: Type II Diabetes Treated with: Oral agents Blood sugar tested every day: No Genitourinary Medical History: Negative for: End Stage Renal Disease Immunological Medical History: Negative for: Lupus Erythematosus; Raynaudos; Scleroderma Integumentary (Skin) Medical History: Negative for: History of Burn; History of pressure wounds Musculoskeletal Medical History: Positive for: Osteoarthritis Negative for: Gout; Rheumatoid Arthritis; Osteomyelitis Neurologic Medical History: Positive for: Neuropathy Negative for: Dementia; Quadriplegia; Paraplegia; Seizure Disorder Oncologic Medical History: Negative for: Received Chemotherapy; Received Radiation Past Medical History Notes: 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 Psychiatric Complaints and Symptoms: No Complaints or Symptoms Medical History: Negative for: Anorexia/bulimia; Confinement Anxiety HBO Extended History Items Eyes: Cataracts Immunizations Pneumococcal Vaccine: [name], SCHOFFSTALL ([id number]) Received Pneumococcal Vaccination: Yes Immunization Notes: up to date Implantable Devices Family and Social History Former smoker - chew tobacco; Marital Status - Widowed; Alcohol Use: Never; Drug Use: No History; Caffeine Use: Daily; Financial Concerns: No; Food, Clothing or Shelter Needs: No; Support System Lacking:  No; Transportation Concerns: No; Advanced Directives: No; Patient does not want information on Advanced Directives Physician Affirmation I have reviewed and agree with the above information. Electronic Signature(s) Signed: 07/06/2017 5:37:52 PM By: Worthy Keeler PA-C Signed: 07/09/2017 4:56:01 PM By: Montey Hora Entered By: Worthy Keeler on 07/06/2017  [place], [place] ([id number]) -------------------------------------------------------------------------------- SuperBill Details Patient Name: KINGSON, LOHMEYER. Date of Service: 07/06/2017 Medical Record Number: [id number] Patient Account Number: [id number] Date of Birth/Sex: 10/09/1945 (72 y.o. M) Treating RN: Montey Hora Primary Care Provider: Thomes Lolling Other Clinician: Referring Provider: Thomes Lolling Treating Provider/Extender: Sharalyn Ink in Treatment: 52 Diagnosis Coding ICD-10 Codes Code Description E11.622 Type 2 diabetes mellitus with other skin ulcer Unspecified open wound of abdominal wall, right upper quadrant without penetration into peritoneal S31.100A cavity, initial encounter Unspecified open wound of abdominal wall, epigastric region without penetration into peritoneal cavity, S31.102A initial encounter F17.228 Nicotine dependence, chewing tobacco, with other nicotine-induced disorders Facility Procedures CPT4 Code: [id number] Description: 49702 - WOUND CARE VISIT-LEV 2 EST PT Modifier: Quantity: 1 Physician Procedures CPT4: Description Modifier Quantity Code [id number] 99213 - WC PHYS LEVEL 3 - EST PT 1 ICD-10 Diagnosis Description E11.622 Type 2 diabetes mellitus with other skin ulcer S31.100A Unspecified open wound of abdominal wall, right upper quadrant without  penetration into peritoneal cavity, initial encounter S31.102A Unspecified open wound of abdominal wall, epigastric region without penetration into peritoneal cavity, initial encounter F17.228 Nicotine dependence, chewing tobacco,  with other  nicotine-induced disorders Electronic Signature(s) Signed: 07/06/2017 5:37:52 PM By: Worthy Keeler PA-C Entered By: Worthy Keeler on 07/06/2017 17:09:29

## 2017-07-20 ENCOUNTER — Encounter: Payer: Medicare HMO | Attending: Physician Assistant | Admitting: Physician Assistant

## 2017-07-20 DIAGNOSIS — X58XXXA Exposure to other specified factors, initial encounter: Secondary | ICD-10-CM | POA: Diagnosis not present

## 2017-07-20 DIAGNOSIS — F17228 Nicotine dependence, chewing tobacco, with other nicotine-induced disorders: Secondary | ICD-10-CM | POA: Insufficient documentation

## 2017-07-20 DIAGNOSIS — E11622 Type 2 diabetes mellitus with other skin ulcer: Secondary | ICD-10-CM | POA: Insufficient documentation

## 2017-07-20 DIAGNOSIS — E1142 Type 2 diabetes mellitus with diabetic polyneuropathy: Secondary | ICD-10-CM | POA: Insufficient documentation

## 2017-07-20 DIAGNOSIS — E1151 Type 2 diabetes mellitus with diabetic peripheral angiopathy without gangrene: Secondary | ICD-10-CM | POA: Insufficient documentation

## 2017-07-20 DIAGNOSIS — S31109A Unspecified open wound of abdominal wall, unspecified quadrant without penetration into peritoneal cavity, initial encounter: Secondary | ICD-10-CM | POA: Diagnosis not present

## 2017-07-20 DIAGNOSIS — Y929 Unspecified place or not applicable: Secondary | ICD-10-CM | POA: Diagnosis not present

## 2017-07-23 NOTE — Progress Notes (Signed)
[name] ([id number]) Visit Report for 07/20/2017 Chief Complaint Document Details Patient Name: [name], [name]. Date of Service: 07/20/2017 10:00 AM Medical Record Number: [id number] Patient Account Number: [id number] Date of Birth/Sex: 16-Dec-1945 (72 y.o. M) Treating RN: Primary Care Provider: Thomes Lolling Other Clinician: Referring Provider: Thomes Lolling Treating Provider/Extender: Melburn Hake,  Weeks in Treatment: 52 Information Obtained from: Patient Chief Complaint Patients presents for treatment of an open diabetic ulcer to the anterior abdominal wall in the epigastric and right upper quadrant Electronic Signature(s) Signed: 07/21/2017 10:27:37 AM By: Worthy Keeler PA-C Entered By: Worthy Keeler on 07/20/2017 10:09:13 [name] ([id number]) -------------------------------------------------------------------------------- HPI Details Patient Name: [name] Date of Service: 07/20/2017 10:00 AM Medical Record Number: [id number] Patient Account Number: [id number] Date of Birth/Sex: [birth year] (72 y.o. M) Treating RN: Primary Care Provider: Thomes Lolling Other Clinician: Referring Provider: Thomes Lolling Treating Provider/Extender: Melburn Hake,  Weeks in Treatment: 29 History of Present Illness HPI Description: 72 year old gentleman has been seen recently by his PCP Dr. Thomes Lolling, who sees him with a history of diabetes mellitus, peripheral neuropathy, B12 deficiency, hypertension, osteoarthritis and the patient had recent blood sugar checked which was 163. Past medical history includes essential hypertension, incisional hernia, diabetes mellitus, colon polyps, COPD, tobacco abuse in the past, chewing tobacco, GERD. He has also been treated for chronic back pain with no sciatica and is on oxycodone. His last hemoglobin A1c was 6.9% His abdominal surgery was done at [facility] over 15 years ago and gradually there was sutures which protruded and  then there was mesh which protruded. He washes this during his shower but does not put any dressing and the wound is covered with a lot of debris. He has never had a surgical consultation for this. 07/10/2016 -- the patient has spoken to his PCP who said he was going to call me but I have not yet had a phone conversation with the physician. From what I understand the physician was reluctant to have a surgical opinion because of the patient's several comorbidities and he feels that the surgery may be detrimental to his overall health 07/24/2016 -- the patient's daughter is at the bedside and we've had a detailed discussion regarding his options for a surgical opinion and possible surgical intervention before this becomes a emergent situation. She will seek primary care input and take the father to [facility] for a surgical opinion. 07/31/2016 -- the patient's daughter has organized for a surgical opinion at [facility] on June 14 08/14/2016 -- it has been about 3 weeks since he has stopped chewing tobacco. 08/28/2016 -- he was seen at [facility] by Dr. Roby Lofts -- the assessment was based on physical examination and review of his previous history of surgical repair. the patient had undergone a ventral hernia repair in 1998 with Dr. Quay Burow at [facility] at a prior open cholecystectomy incision. he was evaluated by Dr. Denice Paradise in 2013 and at that time it was decided to watch and wait. Dr. Payton Doughty recommended a repeat CT scan and requested records from dura and regional operative records and would follow-up the patient, after Mr. Laviolette PCP Dr. Gwenlyn Saran reviewed him for overall fitness for surgery. 09/04/16 patient's wound actually appear to be doing well on evaluation today. He is not really experience any discomfort and the right abdominal wound is dry and almost appears to be healing up. He continues to have some drainage from the left but this seems to be more  of a potential seroma which  with milking is able to be cleared out. I discussed with patient that when he performs his dressing changes at home it would be beneficial for him to do this as well. Otherwise there's no evidence of infection. He is still waiting on his CT scan and appointments to determine whether he is going to have another surgery. 09/11/2016 -- the patient has had a CT scan and is awaiting his surgical appointment prior to deciding on any surgical intervention. 09/25/16 patient presents today for fault evaluation concerning his ongoing abdominal surgical wounds. Unfortunately they do not appear to be improving significantly but rather fluctuate from a little better to little worse week by week. Obviously this is somewhat frustrating for him. 10/02/2016 -- the patient has completed his CT scan, and has a review by the surgeons at the end of August. He has a new area which has opened up a little below his left abdominal wound. 10/16/16 on evaluation today patient's wounds on the abdominal region appears to be doing about the same. He actually has an appointment with his surgeon on the 21st for evaluation regarding the required products appointment. Obviously he is having a difficult time with the feeling of these wounds. Fortunately there does not appear to be any evidence of local infection such as significant amount of erythema or streaking. She also has an audio, vomiting, or diarrhea. He is not have any discomfort at this point. [name], [name] ([id number]) 10/30/2016 -- the surgical review is tomorrow and I am awaiting the input of the surgeon. 11/16/2016 -- I had a call from his surgeon Dr. Payton Doughty, on phone number [phone] who was kind enough to call me regarding his care. After a thorough review and having consulted the abdominal wall reconstruction team they have decided that surgery will be too extensive and rather moribund for this gentleman and have asked him to continue with local care at the  wound center. She will send me an official note via Epic. 03/09/2017 -- the patient comes for some abdominal wall wounds which were caused by protruding mesh and complete surgical review was done at [facility] and no surgery has been recommended. He continues to get palliative care and the right abdominal wound is completely healed. He has a few open spots on the left which are being treated with palliative care and we see him once a month 05/04/17 on evaluation today Mr. Isenberg presents for follow-up concerning his abdominal surgical ulcer. He does have some additional mashed taking out of the wound at this point although he has made a lot of progress since last time I saw him. There is good epithelialization in a lot of areas although there is still mesh poking through which is preventing complete closure. Fortunately there does not appear to be any evidence of infection. 05/18/17 on evaluation today patient appears to be doing very well in regard to his Adamo ulcer. Of the two areas that were previously open one has completely close in the proximal portion of the wound and the remainder of the opening which is the inferior portion is significantly smaller and there does not appear to be a lot of significant issue with mesh poking through the skin currently. In fact this is a nice rather circular indention where he has granulation in the base. There was some of the mesh still showing through but very little. Overall he has made significant improvements in my opinion since even two weeks ago when  I saw him. 06/22/17 on evaluation today patient's abdominal ulcer on inspection appears to be doing much better. He still has 2 tiny openings at either end of the surgical site where he has scar tissue noted at this point. Fortunately there does not appear to be any evidence of infection which is good news. He does still have some evidence of the mesh showing which still seems to be preventing this from  fully closing but fortunately this seems to be minimal even compared to my last evaluation. Overall he has been doing very well. 07/06/17 on evaluation today patient's abdominal ulcer actually appears to be doing much better. He's showing signs of improvement little by little. Overall I feel this is appropriate as far as the progression that has been made. With that being said it's still been a very slow process. There's no evidence of infection. 07/20/17 on evaluation today patient appears to be doing rather well in regard to his left abdominal ulcer. He has been tolerating the dressing changes without complication fortunately there does not appear to be any evidence of infection. He still has two small openings although both of these appear to have filled in much more nicely compared to last week's evaluation. There is no obvious exposed mesh at this point. Electronic Signature(s) Signed: 07/21/2017 10:27:37 AM By: Worthy Keeler PA-C Entered By: Worthy Keeler on 07/20/2017 10:22:45 [name] ([id number]) -------------------------------------------------------------------------------- Physical Exam Details Patient Name: ISAMU, TRAMMEL Date of Service: 07/20/2017 10:00 AM Medical Record Number: [id number] Patient Account Number: [id number] Date of Birth/Sex: [birth year] (72 y.o. M) Treating RN: Primary Care Provider: Thomes Lolling Other Clinician: Referring Provider: Thomes Lolling Treating Provider/Extender: Melburn Hake,  Weeks in Treatment: 24 Constitutional Well-nourished and well-hydrated in no acute distress. Respiratory normal breathing without difficulty. clear to auscultation bilaterally. Cardiovascular regular rate and rhythm with normal S1, S2. Psychiatric this patient is able to make decisions and demonstrates good insight into disease process. Alert and Oriented x 3. pleasant and cooperative. Notes Currently patient's wounds show signs of good granulation although  due to scar tissue it is very difficult for this area to heal he seems to be progressing very nicely. I'm happy with the results of the Prisma although I'm thinking that maybe getting somewhat stuck I'm gonna suggest smaller pieces and using a mepitel over top. We will see how this does. Hope Electronic Signature(s) Signed: 07/21/2017 10:27:37 AM By: Worthy Keeler PA-C Entered By: Worthy Keeler on 07/20/2017 10:23:29 [name] ([id number]) -------------------------------------------------------------------------------- Physician Orders Details Patient Name: JAIDEEP, POLLACK Date of Service: 07/20/2017 10:00 AM Medical Record Number: [id number] Patient Account Number: [id number] Date of Birth/Sex: 1945/11/17 (72 y.o. M) Treating RN: Montey Hora Primary Care Provider: Thomes Lolling Other Clinician: Referring Provider: Thomes Lolling Treating Provider/Extender: Sharalyn Ink in Treatment: 59 Verbal / Phone Orders: No Diagnosis Coding ICD-10 Coding Code Description E11.622 Type 2 diabetes mellitus with other skin ulcer Unspecified open wound of abdominal wall, right upper quadrant without penetration into peritoneal S31.100A cavity, initial encounter Unspecified open wound of abdominal wall, epigastric region without penetration into peritoneal cavity, S31.102A initial encounter F17.228 Nicotine dependence, chewing tobacco, with other nicotine-induced disorders Wound Cleansing Wound #2 Left Abdomen - midline o Clean wound with Normal Saline. o May Shower, gently pat wound dry prior to applying new dressing. Skin Barriers/Peri-Wound Care Wound #2 Left Abdomen - midline o Skin Prep Primary Wound Dressing Wound #2 Left Abdomen - midline o Mepitel One Contact layer o  Silver Collagen Secondary Dressing Wound #2 Left Abdomen - midline o Other - t[place]a [place] Dressing Change Frequency Wound #2 Left Abdomen - midline o Change dressing every other  day. Follow-up Appointments Wound #2 Left Abdomen - midline o Return Appointment in 2 weeks. Electronic Signature(s) Signed: 07/20/2017 4:42:41 PM By: Montey Hora Signed: 07/21/2017 10:27:37 AM By: Worthy Keeler PA-C Entered By: Montey Hora on 07/20/2017 10:18:16 AMONTAE, NG ([id number], STIFF ([id number]) -------------------------------------------------------------------------------- Problem List Details Patient Name: MICK, TANGUMA Date of Service: 07/20/2017 10:00 AM Medical Record Number: [id number] Patient Account Number: [id number] Date of Birth/Sex: August 28, 1945 (72 y.o. M) Treating RN: Primary Care Provider: Thomes Lolling Other Clinician: Referring Provider: Thomes Lolling Treating Provider/Extender: Sharalyn Ink in Treatment: 45 Active Problems ICD-10 Impacting Encounter Code Description Active Date Wound Healing Diagnosis E11.622 Type 2 diabetes mellitus with other skin ulcer 07/03/2016 Yes S31.100A Unspecified open wound of abdominal wall, right upper 07/03/2016 Yes quadrant without penetration into peritoneal cavity, initial encounter S31.102A Unspecified open wound of abdominal wall, epigastric region 07/03/2016 Yes without penetration into peritoneal cavity, initial encounter F17.228 Nicotine dependence, chewing tobacco, with other nicotine- 07/03/2016 Yes induced disorders Inactive Problems Resolved Problems Electronic Signature(s) Signed: 07/21/2017 10:27:37 AM By: Worthy Keeler PA-C Entered By: Worthy Keeler on 07/20/2017 10:09:07 [name] ([id number]) -------------------------------------------------------------------------------- Progress Note Details Patient Name: [name] Date of Service: 07/20/2017 10:00 AM Medical Record Number: [id number] Patient Account Number: [id number] Date of Birth/Sex: 08-19-1945 (72 y.o. M) Treating RN: Primary Care Provider: Thomes Lolling Other Clinician: Referring Provider: Thomes Lolling Treating Provider/Extender: Melburn Hake,  Weeks in Treatment: 64 Subjective Chief Complaint Information obtained from Patient Patients presents for treatment of an open diabetic ulcer to the anterior abdominal wall in the epigastric and right upper quadrant History of Present Illness (HPI) 72 year old gentleman has been seen recently by his PCP Dr. Thomes Lolling, who sees him with a history of diabetes mellitus, peripheral neuropathy, B12 deficiency, hypertension, osteoarthritis and the patient had recent blood sugar checked which was 163. Past medical history includes essential hypertension, incisional hernia, diabetes mellitus, colon polyps, COPD, tobacco abuse in the past, chewing tobacco, GERD. He has also been treated for chronic back pain with no sciatica and is on oxycodone. His last hemoglobin A1c was 6.9% His abdominal surgery was done at [facility] over 15 years ago and gradually there was sutures which protruded and then there was mesh which protruded. He washes this during his shower but does not put any dressing and the wound is covered with a lot of debris. He has never had a surgical consultation for this. 07/10/2016 -- the patient has spoken to his PCP who said he was going to call me but I have not yet had a phone conversation with the physician. From what I understand the physician was reluctant to have a surgical opinion because of the patient's several comorbidities and he feels that the surgery may be detrimental to his overall health 07/24/2016 -- the patient's daughter is at the bedside and we've had a detailed discussion regarding his options for a surgical opinion and possible surgical intervention before this becomes a emergent situation. She will seek primary care input and take the father to [facility] for a surgical opinion. 07/31/2016 -- the patient's daughter has organized for a surgical opinion at [facility] on June 14 08/14/2016 -- it has  been about 3 weeks since he has stopped chewing tobacco. 08/28/2016 -- he  was seen at [facility] by Dr. Roby Lofts -- the assessment was based on physical examination and review of his previous history of surgical repair. the patient had undergone a ventral hernia repair in 1998 with Dr. Quay Burow at [facility] at a prior open cholecystectomy incision. he was evaluated by Dr. Denice Paradise in 2013 and at that time it was decided to watch and wait. Dr. Payton Doughty recommended a repeat CT scan and requested records from dura and regional operative records and would follow-up the patient, after Mr. Downie PCP Dr. Gwenlyn Saran reviewed him for overall fitness for surgery. 09/04/16 patient's wound actually appear to be doing well on evaluation today. He is not really experience any discomfort and the right abdominal wound is dry and almost appears to be healing up. He continues to have some drainage from the left but this seems to be more of a potential seroma which with milking is able to be cleared out. I discussed with patient that when he performs his dressing changes at home it would be beneficial for him to do this as well. Otherwise there's no evidence of infection. He is still waiting on his CT scan and appointments to determine whether he is going to have another surgery. 09/11/2016 -- the patient has had a CT scan and is awaiting his surgical appointment prior to deciding on any surgical intervention. 09/25/16 patient presents today for fault evaluation concerning his ongoing abdominal surgical wounds. Unfortunately they do not appear to be improving significantly but rather fluctuate from a little better to little worse week by week. Obviously this is somewhat frustrating for him. 10/02/2016 -- the patient has completed his CT scan, and has a review by the surgeons at the end of August. He has a new [name], [name]. ([id number]) area which has opened up a little below his left abdominal wound. 10/16/16 on  evaluation today patient's wounds on the abdominal region appears to be doing about the same. He actually has an appointment with his surgeon on the 21st for evaluation regarding the required products appointment. Obviously he is having a difficult time with the feeling of these wounds. Fortunately there does not appear to be any evidence of local infection such as significant amount of erythema or streaking. She also has an audio, vomiting, or diarrhea. He is not have any discomfort at this point. 10/30/2016 -- the surgical review is tomorrow and I am awaiting the input of the surgeon. 11/16/2016 -- I had a call from his surgeon Dr. Payton Doughty, on phone number [phone] who was kind enough to call me regarding his care. After a thorough review and having consulted the abdominal wall reconstruction team they have decided that surgery will be too extensive and rather moribund for this gentleman and have asked him to continue with local care at the wound center. She will send me an official note via Epic. 03/09/2017 -- the patient comes for some abdominal wall wounds which were caused by protruding mesh and complete surgical review was done at [facility] and no surgery has been recommended. He continues to get palliative care and the right abdominal wound is completely healed. He has a few open spots on the left which are being treated with palliative care and we see him once a month 05/04/17 on evaluation today Mr. Geurts presents for follow-up concerning his abdominal surgical ulcer. He does have some additional mashed taking out of the wound at this point although he has made a lot of progress since last time  I saw him. There is good epithelialization in a lot of areas although there is still mesh poking through which is preventing complete closure. Fortunately there does not appear to be any evidence of infection. 05/18/17 on evaluation today patient appears to be doing very well in regard to  his Adamo ulcer. Of the two areas that were previously open one has completely close in the proximal portion of the wound and the remainder of the opening which is the inferior portion is significantly smaller and there does not appear to be a lot of significant issue with mesh poking through the skin currently. In fact this is a nice rather circular indention where he has granulation in the base. There was some of the mesh still showing through but very little. Overall he has made significant improvements in my opinion since even two weeks ago when I saw him. 06/22/17 on evaluation today patient's abdominal ulcer on inspection appears to be doing much better. He still has 2 tiny openings at either end of the surgical site where he has scar tissue noted at this point. Fortunately there does not appear to be any evidence of infection which is good news. He does still have some evidence of the mesh showing which still seems to be preventing this from fully closing but fortunately this seems to be minimal even compared to my last evaluation. Overall he has been doing very well. 07/06/17 on evaluation today patient's abdominal ulcer actually appears to be doing much better. He's showing signs of improvement little by little. Overall I feel this is appropriate as far as the progression that has been made. With that being said it's still been a very slow process. There's no evidence of infection. 07/20/17 on evaluation today patient appears to be doing rather well in regard to his left abdominal ulcer. He has been tolerating the dressing changes without complication fortunately there does not appear to be any evidence of infection. He still has two small openings although both of these appear to have filled in much more nicely compared to last week's evaluation. There is no obvious exposed mesh at this point. Patient History Information obtained from Patient. Social History Former smoker - chew tobacco,  Marital Status - Widowed, Alcohol Use - Never, Drug Use - No History, Caffeine Use - Daily. Medical And Surgical History Notes Oncologic 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 Review of Systems (ROS) Constitutional Symptoms (General Health) BARUCH, LEWERS ([id number]) Denies complaints or symptoms of Fever, Chills. Respiratory The patient has no complaints or symptoms. Cardiovascular The patient has no complaints or symptoms. Psychiatric The patient has no complaints or symptoms. Objective Constitutional Well-nourished and well-hydrated in no acute distress. Vitals Time Taken: 9:58 AM, Height: 65 in, Weight: 161 lbs, BMI: 26.8, Temperature: 97.9 F, Pulse: 63 bpm, Respiratory Rate: 18 breaths/min, Blood Pressure: 180/80 mmHg. Respiratory normal breathing without difficulty. clear to auscultation bilaterally. Cardiovascular regular rate and rhythm with normal S1, S2. Psychiatric this patient is able to make decisions and demonstrates good insight into disease process. Alert and Oriented x 3. pleasant and cooperative. General Notes: Currently patient's wounds show signs of good granulation although due to scar tissue it is very difficult for this area to heal he seems to be progressing very nicely. I'm happy with the results of the Prisma although I'm thinking that maybe getting somewhat stuck I'm gonna suggest smaller pieces and using a mepitel over top. We will see how this does. [facility] (Hair, Skin)  Wound #2 status is Open. Original cause of wound was Gradually Appeared. The wound is located on the Left Abdomen - midline. The wound measures 0.5cm length x 0.5cm width x 0.2cm depth; 0.196cm^2 area and 0.039cm^3 volume. The wound is limited to skin breakdown. There is no tunneling or undermining noted. There is a medium amount of serous drainage noted. The wound margin is flat and intact. There is large (67-100%) red granulation within the wound bed. There is a  small (1-33%) amount of necrotic tissue within the wound bed including Adherent Slough. The periwound skin appearance exhibited: Induration, Scarring. The periwound skin appearance did not exhibit: Callus, Crepitus, Excoriation, Rash, Dry/Scaly, Maceration, Atrophie Blanche, Cyanosis, Ecchymosis, Hemosiderin Staining, Mottled, Pallor, Rubor, Erythema. Periwound temperature was noted as No Abnormality. Assessment Active Problems ICD-10 [name], [name] ([id number]) E11.622 - Type 2 diabetes mellitus with other skin ulcer S31.100A - Unspecified open wound of abdominal wall, right upper quadrant without penetration into peritoneal cavity, initial encounter S31.102A - Unspecified open wound of abdominal wall, epigastric region without penetration into peritoneal cavity, initial encounter F17.228 - Nicotine dependence, chewing tobacco, with other nicotine-induced disorders Plan Wound Cleansing: Wound #2 Left Abdomen - midline: Clean wound with Normal Saline. May Shower, gently pat wound dry prior to applying new dressing. Skin Barriers/Peri-Wound Care: Wound #2 Left Abdomen - midline: Skin Prep Primary Wound Dressing: Wound #2 Left Abdomen - midline: Mepitel One Contact layer Silver Collagen Secondary Dressing: Wound #2 Left Abdomen - midline: Other - [place] Dressing Change Frequency: Wound #2 Left Abdomen - midline: Change dressing every other day. Follow-up Appointments: Wound #2 Left Abdomen - midline: Return Appointment in 2 weeks. I'm hopeful this will help with getting the wound to close more securely and quickly over the next couple of weeks. We will see were things stand in two weeks time. Patient is in agreement the plan. If anything changes he will contact the office and let us know. Please see above for specific wound care orders. We will see patient for re-evaluation in 2 week(s) here in the clinic. If anything worsens or changes patient will contact our office for  additional recommendations. Electronic Signature(s) Signed: 07/21/2017 10:27:37 AM By: Worthy Keeler PA-C Entered By: Worthy Keeler on 07/20/2017 10:24:06 [name] ([id number]) -------------------------------------------------------------------------------- ROS/PFSH Details Patient Name: [name] Date of Service: 07/20/2017 10:00 AM Medical Record Number: [id number] Patient Account Number: [id number] Date of Birth/Sex: 11/15/1945 (72 y.o. M) Treating RN: Primary Care Provider: Thomes Lolling Other Clinician: Referring Provider: Thomes Lolling Treating Provider/Extender: Melburn Hake,  Weeks in Treatment: 11 Information Obtained From Patient Wound History Do you currently have one or more open woundso Yes How many open wounds do you currently haveo 2 Approximately how long have you had your woundso more than 1 year How have you been treating your wound(s) until nowo open to air Has your wound(s) ever healed and then re-openedo No Have you had any lab work done in the past montho No Have you tested positive for an antibiotic resistant organism (MRSA, VRE)o No Have you tested positive for osteomyelitis (bone infection)o No Have you had any tests for circulation on your legso No Constitutional Symptoms (General Health) Complaints and Symptoms: Negative for: Fever; Chills Eyes Medical History: Positive for: Cataracts - removed Hematologic/Lymphatic Medical History: Negative for: Anemia; Hemophilia; Human Immunodeficiency Virus; Lymphedema; Sickle Cell Disease Respiratory Complaints and Symptoms: No Complaints or Symptoms Medical History: Positive for: Chronic Obstructive Pulmonary Disease (COPD) Negative for: Aspiration; Asthma; Pneumothorax;  Sleep Apnea; Tuberculosis Cardiovascular Complaints and Symptoms: No Complaints or Symptoms Medical History: Positive for: Hypertension Negative for: Angina; Arrhythmia; Congestive Heart Failure; Coronary Artery Disease;  Deep Vein Thrombosis; Hypotension; Myocardial Infarction; Peripheral Arterial Disease; Peripheral Venous Disease; Phlebitis; Vasculitis Gastrointestinal DEAGLAN, LILE ([id number]) Medical History: Negative for: Cirrhosis ; Colitis; Crohnos; Hepatitis A; Hepatitis B; Hepatitis C Endocrine Medical History: Positive for: Type II Diabetes Treated with: Oral agents Blood sugar tested every day: No Genitourinary Medical History: Negative for: End Stage Renal Disease Immunological Medical History: Negative for: Lupus Erythematosus; Raynaudos; Scleroderma Integumentary (Skin) Medical History: Negative for: History of Burn; History of pressure wounds Musculoskeletal Medical History: Positive for: Osteoarthritis Negative for: Gout; Rheumatoid Arthritis; Osteomyelitis Neurologic Medical History: Positive for: Neuropathy Negative for: Dementia; Quadriplegia; Paraplegia; Seizure Disorder Oncologic Medical History: Negative for: Received Chemotherapy; Received Radiation Past Medical History Notes: 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 Psychiatric Complaints and Symptoms: No Complaints or Symptoms Medical History: Negative for: Anorexia/bulimia; Confinement Anxiety HBO Extended History Items Eyes: Cataracts Immunizations Pneumococcal Vaccine: EEAN, BUSS ([id number]) Received Pneumococcal Vaccination: Yes Immunization Notes: up to date Implantable Devices Family and Social History Former smoker - chew tobacco; Marital Status - Widowed; Alcohol Use: Never; Drug Use: No History; Caffeine Use: Daily; Financial Concerns: No; Food, Clothing or Shelter Needs: No; Support System Lacking: No; Transportation Concerns: No; Advanced Directives: No; Patient does not want information on Advanced Directives Physician Affirmation I have reviewed and agree with the above information. Electronic Signature(s) Signed: 07/21/2017 10:27:37 AM By: Worthy Keeler PA-C Entered By: Worthy Keeler on 07/20/2017 10:23:03 [name] ([id number]) -------------------------------------------------------------------------------- SuperBill Details Patient Name: [name] Date of Service: 07/20/2017 Medical Record Number: [id number] Patient Account Number: [id number] Date of Birth/Sex: 1945/09/08 (72 y.o. M) Treating RN: Primary Care Provider: Thomes Lolling Other Clinician: Referring Provider: Thomes Lolling Treating Provider/Extender: Melburn Hake,  Weeks in Treatment: 66 Diagnosis Coding ICD-10 Codes Code Description E11.622 Type 2 diabetes mellitus with other skin ulcer Unspecified open wound of abdominal wall, right upper quadrant without penetration into peritoneal S31.100A cavity, initial encounter Unspecified open wound of abdominal wall, epigastric region without penetration into peritoneal cavity, S31.102A initial encounter F17.228 Nicotine dependence, chewing tobacco, with other nicotine-induced disorders Facility Procedures CPT4 Code: 61950932 Description: 67124 - WOUND CARE VISIT-LEV 2 EST PT Modifier: Quantity: 1 Physician Procedures CPT4: Description Modifier Quantity Code [id number] 99213 - WC PHYS LEVEL 3 - EST PT 1 ICD-10 Diagnosis Description E11.622 Type 2 diabetes mellitus with other skin ulcer S31.100A Unspecified open wound of abdominal wall, right upper quadrant without  penetration into peritoneal cavity, initial encounter S31.102A Unspecified open wound of abdominal wall, epigastric region without penetration into peritoneal cavity, initial encounter F17.228 Nicotine dependence, chewing tobacco, with other  nicotine-induced disorders Electronic Signature(s) Signed: 07/21/2017 10:27:37 AM By: Worthy Keeler PA-C Entered By: Worthy Keeler on 07/20/2017 10:24:30

## 2017-07-23 NOTE — Progress Notes (Signed)
[name], [name] ([id number]) Visit Report for 07/20/2017 Arrival Information Details Patient Name: [name], FISH. Date of Service: 07/20/2017 10:00 AM Medical Record Number: [id number] Patient Account Number: [id number] Date of Birth/Sex: June 29, 1945 (72 y.o. M) Treating RN: Roger Shelter Primary Care Shamela Haydon: Thomes [name] Other Clinician: Referring Nivaan Dicenzo: Thomes [name] Treating Adasyn Mcadams/Extender: Sharalyn Ink in Treatment: 80 Visit Information History Since Last Visit All ordered tests and consults were completed: No Patient Arrived: Ambulatory Added or deleted any medications: No Arrival Time: 09:56 Any new allergies or adverse reactions: No Accompanied By: self Had a fall or experienced change in No Transfer Assistance: None activities of daily living that may affect Patient Identification Verified: Yes risk of falls: Secondary Verification Process Completed: Yes Signs or symptoms of abuse/neglect since last visito No Patient Requires Transmission-Based No Hospitalized since last visit: No Precautions: Implantable device outside of the clinic excluding No Patient Has Alerts: Yes cellular tissue based products placed in the center Patient Alerts: DMII since last visit: Pain Present Now: No Electronic Signature(s) Signed: 07/20/2017 3:34:23 PM By: Roger Shelter Entered By: Roger Shelter on 07/20/2017 09:56:39 [name] ([id number]) -------------------------------------------------------------------------------- Clinic Level of Care Assessment Details Patient Name: [name] Date of Service: 07/20/2017 10:00 AM Medical Record Number: [id number] Patient Account Number: [id number] Date of Birth/Sex: 1945-09-28 (72 y.o. M) Treating RN: Montey Hora Primary Care Dominion Kathan: Thomes [name] Other Clinician: Referring Harlie Buening: Thomes [name] Treating Karly Pitter/Extender: Melburn Hake, HOYT Weeks in Treatment: 61 Clinic Level of Care Assessment  Items TOOL 4 Quantity Score []  - Use when only an EandM is performed on FOLLOW-UP visit 0 ASSESSMENTS - Nursing Assessment / Reassessment X - Reassessment of Co-morbidities (includes updates in patient status) 1 10 X- 1 5 Reassessment of Adherence to Treatment Plan ASSESSMENTS - Wound and Skin Assessment / Reassessment X - Simple Wound Assessment / Reassessment - one wound 1 5 []  - 0 Complex Wound Assessment / Reassessment - multiple wounds []  - 0 Dermatologic / Skin Assessment (not related to wound area) ASSESSMENTS - Focused Assessment []  - Circumferential Edema Measurements - multi extremities 0 []  - 0 Nutritional Assessment / Counseling / Intervention []  - 0 Lower Extremity Assessment (monofilament, tuning fork, pulses) []  - 0 Peripheral Arterial Disease Assessment (using hand held doppler) ASSESSMENTS - Ostomy and/or Continence Assessment and Care []  - Incontinence Assessment and Management 0 []  - 0 Ostomy Care Assessment and Management (repouching, etc.) PROCESS - Coordination of Care X - Simple Patient / Family Education for ongoing care 1 15 []  - 0 Complex (extensive) Patient / Family Education for ongoing care []  - 0 Staff obtains Programmer, systems, Records, Test Results / Process Orders []  - 0 Staff telephones HHA, Nursing Homes / Clarify orders / etc []  - 0 Routine Transfer to another Facility (non-emergent condition) []  - 0 Routine Hospital Admission (non-emergent condition) []  - 0 New Admissions / Biomedical engineer / Ordering NPWT, Apligraf, etc. []  - 0 Emergency Hospital Admission (emergent condition) X- 1 10 Simple Discharge Coordination [name], [name] ([id number]) []  - 0 Complex (extensive) Discharge Coordination PROCESS - Special Needs []  - Pediatric / Minor Patient Management 0 []  - 0 Isolation Patient Management []  - 0 Hearing / Language / Visual special needs []  - 0 Assessment of Community assistance (transportation, D/C planning, etc.) []  -  0 Additional assistance / Altered mentation []  - 0 Support Surface(s) Assessment (bed, cushion, seat, etc.) INTERVENTIONS - Wound Cleansing / Measurement X - Simple Wound Cleansing - one wound 1 5 []  -  0 Complex Wound Cleansing - multiple wounds X- 1 5 Wound Imaging (photographs - any number of wounds) []  - 0 Wound Tracing (instead of photographs) X- 1 5 Simple Wound Measurement - one wound []  - 0 Complex Wound Measurement - multiple wounds INTERVENTIONS - Wound Dressings X - Small Wound Dressing one or multiple wounds 1 10 []  - 0 Medium Wound Dressing one or multiple wounds []  - 0 Large Wound Dressing one or multiple wounds []  - 0 Application of Medications - topical []  - 0 Application of Medications - injection INTERVENTIONS - Miscellaneous []  - External ear exam 0 []  - 0 Specimen Collection (cultures, biopsies, blood, body fluids, etc.) []  - 0 Specimen(s) / Culture(s) sent or taken to Lab for analysis []  - 0 Patient Transfer (multiple staff / Civil Service fast streamer / Similar devices) []  - 0 Simple Staple / Suture removal (25 or less) []  - 0 Complex Staple / Suture removal (26 or more) []  - 0 Hypo / Hyperglycemic Management (close monitor of Blood Glucose) []  - 0 Ankle / Brachial Index (ABI) - do not check if billed separately X- 1 5 Vital Signs [name], RIBAS. ([id number]) Has the patient been seen at the hospital within the last three years: Yes Total Score: 75 Level Of Care: New/Established - Level 2 Electronic Signature(s) Signed: 07/20/2017 4:42:41 PM By: Montey Hora Entered By: Montey Hora on 07/20/2017 10:18:41 [name] ([id number]) -------------------------------------------------------------------------------- Encounter Discharge Information Details Patient Name: [name] Date of Service: 07/20/2017 10:00 AM Medical Record Number: [id number] Patient Account Number: [id number] Date of Birth/Sex: 02-13-1946 (72 y.o. M) Treating RN: Montey Hora Primary Care Giorgi Debruin: Thomes [name] Other Clinician: Referring Kaleena Corrow: Thomes [name] Treating Avni Traore/Extender: Sharalyn Ink in Treatment: 78 Encounter Discharge Information Items Discharge Condition: Stable Ambulatory Status: Ambulatory Discharge Destination: Home Transportation: Private Auto Accompanied By: self Schedule Follow-up Appointment: Yes Clinical Summary of Care: Electronic Signature(s) Signed: 07/20/2017 4:42:41 PM By: Montey Hora Entered By: Montey Hora on 07/20/2017 10:19:31 [name] ([id number]) -------------------------------------------------------------------------------- Lower Extremity Assessment Details Patient Name: [name] Date of Service: 07/20/2017 10:00 AM Medical Record Number: [id number] Patient Account Number: [id number] Date of Birth/Sex: 1946/01/11 (72 y.o. M) Treating RN: Roger Shelter Primary Care Carnesha Maravilla: Thomes [name] Other Clinician: Referring Adelis Docter: Thomes [name] Treating Dymphna Wadley/Extender: Melburn Hake, HOYT Weeks in Treatment: 11 Electronic Signature(s) Signed: 07/20/2017 3:34:23 PM By: Roger Shelter Entered By: Roger Shelter on 07/20/2017 10:03:02 [name] ([id number]) -------------------------------------------------------------------------------- Multi Wound Chart Details Patient Name: [name] Date of Service: 07/20/2017 10:00 AM Medical Record Number: [id number] Patient Account Number: [id number] Date of Birth/Sex: Aug 07, 1945 (72 y.o. M) Treating RN: Montey Hora Primary Care Margarett Viti: Thomes [name] Other Clinician: Referring Henok Heacock: Thomes [name] Treating Olanna Percifield/Extender: Melburn Hake, HOYT Weeks in Treatment: 44 Vital Signs Height(in): 65 Pulse(bpm): 29 Weight(lbs): 161 Blood Pressure(mmHg): 180/80 Body Mass Index(BMI): 27 Temperature(F): 97.9 Respiratory Rate 18 (breaths/min): Photos: [2:No Photos] [N/A:N/A] Wound Location: [2:Left Abdomen - midline]  [N/A:N/A] Wounding Event: [2:Gradually Appeared] [N/A:N/A] Primary Etiology: [2:Atypical] [N/A:N/A] Comorbid History: [2:Cataracts, Chronic Obstructive N/A Pulmonary Disease (COPD), Hypertension, Type II Diabetes, Osteoarthritis, Neuropathy] Date Acquired: [2:05/17/2015] [N/A:N/A] Weeks of Treatment: [2:54] [N/A:N/A] Wound Status: [2:Open] [N/A:N/A] Clustered Wound: [2:Yes] [N/A:N/A] Clustered Quantity: [2:1] [N/A:N/A] Measurements L x W x D [2:0.5x0.5x0.2] [N/A:N/A] (cm) Area (cm) : [2:0.196] [N/A:N/A] Volume (cm) : [2:0.039] [N/A:N/A] % Reduction in Area: [2:96.30%] [N/A:N/A] % Reduction in Volume: [2:92.60%] [N/A:N/A] Classification: [2:Full Thickness Without Exposed Support Structures] [N/A:N/A] Exudate Amount: [2:Medium] [N/A:N/A] Exudate Type: [2:Serous] [  N/A:N/A] Exudate Color: [2:amber] [N/A:N/A] Wound Margin: [2:Flat and Intact] [N/A:N/A] Granulation Amount: [2:Large (67-100%)] [N/A:N/A] Granulation Quality: [2:Red] [N/A:N/A] Necrotic Amount: [2:Small (1-33%)] [N/A:N/A] Exposed Structures: [2:Fascia: No Fat Layer (Subcutaneous Tissue) Exposed: No Tendon: No Muscle: No Joint: No Bone: No Limited to Skin Breakdown] [N/A:N/A] Epithelialization: Large (67-100%) N/A N/A Periwound Skin Texture: Induration: Yes N/A N/A Scarring: Yes Excoriation: No Callus: No Crepitus: No Rash: No Periwound Skin Moisture: Maceration: No N/A N/A Dry/Scaly: No Periwound Skin Color: Atrophie Blanche: No N/A N/A Cyanosis: No Ecchymosis: No Erythema: No Hemosiderin Staining: No Mottled: No Pallor: No Rubor: No Temperature: No Abnormality N/A N/A Tenderness on Palpation: No N/A N/A Wound Preparation: Ulcer Cleansing: N/A N/A Rinsed/Irrigated with Saline Topical Anesthetic Applied: Other: lidocaine 4% Treatment Notes Electronic Signature(s) Signed: 07/20/2017 4:42:41 PM By: Montey Hora Entered By: Montey Hora on 07/20/2017 10:14:13 [name]  ([id number]) -------------------------------------------------------------------------------- [place] Plan Details Patient Name: [name], [name] Date of Service: 07/20/2017 10:00 AM Medical Record Number: [id number] Patient Account Number: [id number] Date of Birth/Sex: 02-05-1946 (72 y.o. M) Treating RN: Montey Hora Primary Care Harrold Fitchett: Thomes [name] Other Clinician: Referring Jeffree Cazeau: Thomes [name] Treating Siris Hoos/Extender: Melburn Hake, HOYT Weeks in Treatment: 69 Active Inactive ` Abuse / Safety / Falls / Self Care Management Nursing Diagnoses: Impaired physical mobility Goals: Patient will remain injury free Date Initiated: 07/03/2016 Target Resolution Date: 09/14/2016 Goal Status: Active Interventions: Assess fall risk on admission and as needed Notes: ` Orientation to the Wound Care Program Nursing Diagnoses: Knowledge deficit related to the wound healing center program Goals: Patient/caregiver will verbalize understanding of the [place] Date Initiated: 07/03/2016 Target Resolution Date: 09/15/2016 Goal Status: Active Interventions: Provide education on orientation to the wound center Notes: ` Wound/Skin Impairment Nursing Diagnoses: Impaired tissue integrity Goals: Patient/caregiver will verbalize understanding of skin care regimen Date Initiated: 07/03/2016 Target Resolution Date: 09/15/2016 Goal Status: Active Ulcer/skin breakdown will have a volume reduction of 30% by week 4 Date Initiated: 07/03/2016 Target Resolution Date: 09/15/2016 [name], [name] ([id number]) Goal Status: Active Ulcer/skin breakdown will have a volume reduction of 50% by week 8 Date Initiated: 07/03/2016 Target Resolution Date: 09/15/2016 Goal Status: Active Ulcer/skin breakdown will have a volume reduction of 80% by week 12 Date Initiated: 07/03/2016 Target Resolution Date: 09/15/2016 Goal Status: Active Ulcer/skin breakdown will heal within 14 weeks Date  Initiated: 07/03/2016 Target Resolution Date: 09/15/2016 Goal Status: Active Interventions: Assess patient/caregiver ability to obtain necessary supplies Assess patient/caregiver ability to perform ulcer/skin care regimen upon admission and as needed Assess ulceration(s) every visit Notes: Electronic Signature(s) Signed: 07/20/2017 4:42:41 PM By: Montey Hora Entered By: Montey Hora on 07/20/2017 10:14:06 [name] ([id number]) -------------------------------------------------------------------------------- Pain Assessment Details Patient Name: [name] Date of Service: 07/20/2017 10:00 AM Medical Record Number: [id number] Patient Account Number: [id number] Date of Birth/Sex: 08-07-1945 (72 y.o. M) Treating RN: Roger Shelter Primary Care Bertin Inabinet: Thomes [name] Other Clinician: Referring Tetsuo Coppola: Thomes [name] Treating Coleston Dirosa/Extender: Melburn Hake, HOYT Weeks in Treatment: 60 Active Problems Location of Pain Severity and Description of Pain Patient Has Paino No Site Locations Pain Management and Medication Current Pain Management: Electronic Signature(s) Signed: 07/20/2017 3:34:23 PM By: Roger Shelter Entered By: Roger Shelter on 07/20/2017 09:56:59 [name] ([id number]) -------------------------------------------------------------------------------- Patient/Caregiver Education Details Patient Name: [name] Date of Service: 07/20/2017 10:00 AM Medical Record Number: [id number] Patient Account Number: [id number] Date of Birth/Gender: [birth year] (72 y.o. M) Treating RN: Montey Hora Primary Care Physician: Thomes [name] Other Clinician: Referring Physician: Gwenlyn Saran,  MARCO Treating Physician/Extender: Worthy Keeler Weeks in Treatment: 34 Education Assessment Education Provided To: Patient Education Topics Provided Wound/Skin Impairment: Handouts: Other: wound care as ordered Methods: Demonstration, Explain/Verbal Responses: State  content correctly Electronic Signature(s) Signed: 07/20/2017 4:42:41 PM By: Montey Hora Entered By: Montey Hora on 07/20/2017 10:19:52 [name] ([id number]) -------------------------------------------------------------------------------- Wound Assessment Details Patient Name: [name] Date of Service: 07/20/2017 10:00 AM Medical Record Number: [id number] Patient Account Number: [id number] Date of Birth/Sex: July 02, 1945 (72 y.o. M) Treating RN: Roger Shelter Primary Care Taijah Macrae: Thomes [name] Other Clinician: Referring Rhealynn Myhre: Thomes [name] Treating Eliana Lueth/Extender: Melburn Hake, HOYT Weeks in Treatment: 23 Wound Status Wound Number: 2 Primary Atypical Etiology: Wound Location: Left Abdomen - midline Wound Open Wounding Event: Gradually Appeared Status: Date Acquired: 05/17/2015 Comorbid Cataracts, Chronic Obstructive Pulmonary Weeks Of Treatment: 54 History: Disease (COPD), Hypertension, Type II Clustered Wound: Yes Diabetes, Osteoarthritis, Neuropathy Photos Photo Uploaded By: Roger Shelter on 07/20/2017 15:31:47 Wound Measurements Length: (cm) 0.5 Width: (cm) 0.5 Depth: (cm) 0.2 Clustered Quantity: 1 Area: (cm) 0.196 Volume: (cm) 0.039 % Reduction in Area: 96.3% % Reduction in Volume: 92.6% Epithelialization: Large (67-100%) Tunneling: No Undermining: No Wound Description Full Thickness Without Exposed Support Classification: Structures Wound Margin: Flat and Intact Exudate Medium Amount: Exudate Type: Serous Exudate Color: amber Foul Odor After Cleansing: No Slough/Fibrino No Wound Bed Granulation Amount: Large (67-100%) Exposed Structure Granulation Quality: Red Fascia Exposed: No Necrotic Amount: Small (1-33%) Fat Layer (Subcutaneous Tissue) Exposed: No Necrotic Quality: Adherent Slough Tendon Exposed: No Muscle Exposed: No Joint Exposed: No [name], FERENCZ. ([id number]) Bone Exposed: No Limited to Skin  Breakdown Periwound Skin Texture Texture Color No Abnormalities Noted: No No Abnormalities Noted: No Callus: No Atrophie Blanche: No Crepitus: No Cyanosis: No Excoriation: No Ecchymosis: No Induration: Yes Erythema: No Rash: No Hemosiderin Staining: No Scarring: Yes Mottled: No Pallor: No Moisture Rubor: No No Abnormalities Noted: No Dry / Scaly: No Temperature / Pain Maceration: No Temperature: No Abnormality Wound Preparation Ulcer Cleansing: Rinsed/Irrigated with Saline Topical Anesthetic Applied: Other: lidocaine 4%, Treatment Notes Wound #2 (Left Abdomen - midline) 1. Cleansed with: Clean wound with Normal Saline 2. Anesthetic Topical Lidocaine 4% cream to wound bed prior to debridement 4. Dressing Applied: Prisma Ag Mepitel 5. Secondary [place] Signature(s) Signed: 07/20/2017 3:34:23 PM By: Roger Shelter Entered By: Roger Shelter on 07/20/2017 10:02:09 [name] ([id number]) -------------------------------------------------------------------------------- [place] Details Patient Name: [name] Date of Service: 07/20/2017 10:00 AM Medical Record Number: [id number] Patient Account Number: [id number] Date of Birth/Sex: 08-Sep-1945 (72 y.o. M) Treating RN: Roger Shelter Primary Care Tanaysha Alkins: Thomes [name] Other Clinician: Referring Jenne Sellinger: Thomes [name] Treating Mariaisabel Bodiford/Extender: Melburn Hake, HOYT Weeks in Treatment: 54 Vital Signs Time Taken: 09:58 Temperature (F): 97.9 Height (in): 65 Pulse (bpm): 63 Weight (lbs): 161 Respiratory Rate (breaths/min): 18 Body Mass Index (BMI): 26.8 Blood Pressure (mmHg): 180/80 Reference Range: 80 - 120 mg / dl Electronic Signature(s) Signed: 07/20/2017 3:34:23 PM By: Roger Shelter Entered By: Roger Shelter on 07/20/2017 10:00:50

## 2017-07-30 MED ORDER — OXYCODONE-ACETAMINOPHEN 5 MG-325 MG TABLET
ORAL_TABLET | ORAL | 0 refills | 0.00000 days | Status: CP | PRN
Start: 2017-07-30 — End: 2017-09-10

## 2017-08-03 ENCOUNTER — Encounter: Payer: Medicare HMO | Admitting: Physician Assistant

## 2017-08-03 DIAGNOSIS — E11622 Type 2 diabetes mellitus with other skin ulcer: Secondary | ICD-10-CM | POA: Diagnosis not present

## 2017-08-05 NOTE — Progress Notes (Signed)
ELMORE, HYSLOP ([id number]) Visit Report for 08/03/2017 Arrival Information Details Patient Name: [name], [name]. Date of Service: 08/03/2017 10:00 AM Medical Record Number: [id number] Patient Account Number: [id number] Date of Birth/Sex: 08-26-1945 (72 y.o. M) Treating RN: Cornell Barman Primary Care Aashir Umholtz: Thomes Lolling Other Clinician: Referring Izen Petz: Thomes Lolling Treating Dayona Shaheen/Extender: Sharalyn Ink in Treatment: 26 Visit Information History Since Last Visit All ordered tests and consults were completed: No Patient Arrived: Ambulatory Added or deleted any medications: No Arrival Time: 10:03 Any new allergies or adverse reactions: No Accompanied By: self Had a fall or experienced change in No Transfer Assistance: None activities of daily living that may affect Patient Identification Verified: Yes risk of falls: Secondary Verification Process Completed: Yes Signs or symptoms of abuse/neglect since last visito No Patient Requires Transmission-Based No Hospitalized since last visit: No Precautions: Implantable device outside of the clinic excluding No Patient Has Alerts: Yes cellular tissue based products placed in the center Patient Alerts: DMII since last visit: Has Dressing in Place as Prescribed: Yes Pain Present Now: No Electronic Signature(s) Signed: 08/03/2017 6:21:51 PM By: Gretta Cool, BSN, RN, CWS, Kim RN, BSN Entered By: Gretta Cool, BSN, RN, CWS, Kim on 08/03/2017 10:03:34 [name] ([id number]) -------------------------------------------------------------------------------- Clinic Level of Care Assessment Details Patient Name: [name], [name]. Date of Service: 08/03/2017 10:00 AM Medical Record Number: [id number] Patient Account Number: [id number] Date of Birth/Sex: [birth year] (72 y.o. M) Treating RN: Montey Hora Primary Care Patricie Geeslin: Thomes Lolling Other Clinician: Referring Tavaria Mackins: Thomes Lolling Treating Sael Furches/Extender: Melburn Hake, HOYT Weeks  in Treatment: 21 Clinic Level of Care Assessment Items TOOL 4 Quantity Score []  - Use when only an EandM is performed on FOLLOW-UP visit 0 ASSESSMENTS - Nursing Assessment / Reassessment X - Reassessment of Co-morbidities (includes updates in patient status) 1 10 X- 1 5 Reassessment of Adherence to Treatment Plan ASSESSMENTS - Wound and Skin Assessment / Reassessment X - Simple Wound Assessment / Reassessment - one wound 1 5 []  - 0 Complex Wound Assessment / Reassessment - multiple wounds []  - 0 Dermatologic / Skin Assessment (not related to wound area) ASSESSMENTS - Focused Assessment []  - Circumferential Edema Measurements - multi extremities 0 []  - 0 Nutritional Assessment / Counseling / Intervention []  - 0 Lower Extremity Assessment (monofilament, tuning fork, pulses) []  - 0 Peripheral Arterial Disease Assessment (using hand held doppler) ASSESSMENTS - Ostomy and/or Continence Assessment and Care []  - Incontinence Assessment and Management 0 []  - 0 Ostomy Care Assessment and Management (repouching, etc.) PROCESS - Coordination of Care X - Simple Patient / Family Education for ongoing care 1 15 []  - 0 Complex (extensive) Patient / Family Education for ongoing care []  - 0 Staff obtains Programmer, systems, Records, Test Results / Process Orders []  - 0 Staff telephones HHA, Nursing Homes / Clarify orders / etc []  - 0 Routine Transfer to another Facility (non-emergent condition) []  - 0 Routine Hospital Admission (non-emergent condition) []  - 0 New Admissions / Biomedical engineer / Ordering NPWT, Apligraf, etc. []  - 0 Emergency Hospital Admission (emergent condition) X- 1 10 Simple Discharge Coordination [name], [name] ([id number]) []  - 0 Complex (extensive) Discharge Coordination PROCESS - Special Needs []  - Pediatric / Minor Patient Management 0 []  - 0 Isolation Patient Management []  - 0 Hearing / Language / Visual special needs []  - 0 Assessment of Community  assistance (transportation, D/C planning, etc.) []  - 0 Additional assistance / Altered mentation []  - 0 Support Surface(s) Assessment (bed, cushion, seat, etc.) INTERVENTIONS -  Wound Cleansing / Measurement X - Simple Wound Cleansing - one wound 1 5 []  - 0 Complex Wound Cleansing - multiple wounds X- 1 5 Wound Imaging (photographs - any number of wounds) []  - 0 Wound Tracing (instead of photographs) X- 1 5 Simple Wound Measurement - one wound []  - 0 Complex Wound Measurement - multiple wounds INTERVENTIONS - Wound Dressings X - Small Wound Dressing one or multiple wounds 1 10 []  - 0 Medium Wound Dressing one or multiple wounds []  - 0 Large Wound Dressing one or multiple wounds []  - 0 Application of Medications - topical []  - 0 Application of Medications - injection INTERVENTIONS - Miscellaneous []  - External ear exam 0 []  - 0 Specimen Collection (cultures, biopsies, blood, body fluids, etc.) []  - 0 Specimen(s) / Culture(s) sent or taken to Lab for analysis []  - 0 Patient Transfer (multiple staff / Civil Service fast streamer / Similar devices) []  - 0 Simple Staple / Suture removal (25 or less) []  - 0 Complex Staple / Suture removal (26 or more) []  - 0 Hypo / Hyperglycemic Management (close monitor of Blood Glucose) []  - 0 Ankle / Brachial Index (ABI) - do not check if billed separately X- 1 5 Vital Signs [name], [name]. ([id number]) Has the patient been seen at the hospital within the last three years: Yes Total Score: 75 Level Of Care: New/Established - Level 2 Electronic Signature(s) Signed: 08/03/2017 5:24:01 PM By: Montey Hora Entered By: Montey Hora on 08/03/2017 10:33:06 [name] ([id number]) -------------------------------------------------------------------------------- Encounter Discharge Information Details Patient Name: [name] Date of Service: 08/03/2017 10:00 AM Medical Record Number: [id number] Patient Account Number: [id number] Date of  Birth/Sex: [birth year] (72 y.o. M) Treating RN: Montey Hora Primary Care Temprance Wyre: Thomes Lolling Other Clinician: Referring Ardine Iacovelli: Thomes Lolling Treating Tahjay Binion/Extender: Sharalyn Ink in Treatment: 69 Encounter Discharge Information Items Discharge Condition: Stable Ambulatory Status: Ambulatory Discharge Destination: Home Transportation: Private Auto Accompanied By: self Schedule Follow-up Appointment: Yes Clinical Summary of Care: Electronic Signature(s) Signed: 08/03/2017 10:33:54 AM By: Montey Hora Entered By: Montey Hora on 08/03/2017 10:33:54 [name] ([id number]) -------------------------------------------------------------------------------- Lower Extremity Assessment Details Patient Name: [name] Date of Service: 08/03/2017 10:00 AM Medical Record Number: [id number] Patient Account Number: [id number] Date of Birth/Sex: 1946/01/08 (72 y.o. M) Treating RN: Cornell Barman Primary Care Yonael Tulloch: Thomes Lolling Other Clinician: Referring Sione Baumgarten: Thomes Lolling Treating Leoda Smithhart/Extender: Sharalyn Ink in Treatment: 61 Electronic Signature(s) Signed: 08/03/2017 6:21:51 PM By: Gretta Cool, BSN, RN, CWS, Kim RN, BSN Entered By: Gretta Cool, BSN, RN, CWS, Kim on 08/03/2017 10:07:33 [name] ([id number]) -------------------------------------------------------------------------------- Multi Wound Chart Details Patient Name: [name], [name] Date of Service: 08/03/2017 10:00 AM Medical Record Number: [id number] Patient Account Number: [id number] Date of Birth/Sex: Jan 27, 1946 (72 y.o. M) Treating RN: Montey Hora Primary Care Axiel Fjeld: Thomes Lolling Other Clinician: Referring Rogers Ditter: Thomes Lolling Treating Amarionna Arca/Extender: Melburn Hake, HOYT Weeks in Treatment: 78 Vital Signs Height(in): 65 Pulse(bpm): 101 Weight(lbs): 161 Blood Pressure(mmHg): 143/90 Body Mass Index(BMI): 27 Temperature(F): 98.3 Respiratory  Rate 16 (breaths/min): Photos: [2:No Photos] [N/A:N/A] Wound Location: [2:Left Abdomen - midline] [N/A:N/A] Wounding Event: [2:Gradually Appeared] [N/A:N/A] Primary Etiology: [2:Atypical] [N/A:N/A] Comorbid History: [2:Cataracts, Chronic Obstructive N/A Pulmonary Disease (COPD), Hypertension, Type II Diabetes, Osteoarthritis, Neuropathy] Date Acquired: [2:05/17/2015] [N/A:N/A] Weeks of Treatment: [2:56] [N/A:N/A] Wound Status: [2:Open] [N/A:N/A] Clustered Wound: [2:Yes] [N/A:N/A] Clustered Quantity: [2:1] [N/A:N/A] Measurements L x W x D [2:0.2x0.3x0.1] [N/A:N/A] (cm) Area (cm) : [2:0.047] [N/A:N/A] Volume (cm) : [2:0.005] [N/A:N/A] % Reduction in  Area: [2:99.10%] [N/A:N/A] % Reduction in Volume: [2:99.10%] [N/A:N/A] Classification: [2:Full Thickness Without Exposed Support Structures] [N/A:N/A] Exudate Amount: [2:Medium] [N/A:N/A] Exudate Type: [2:Serous] [N/A:N/A] Exudate Color: [2:amber] [N/A:N/A] Wound Margin: [2:Flat and Intact] [N/A:N/A] Granulation Amount: [2:Large (67-100%)] [N/A:N/A] Granulation Quality: [2:Red] [N/A:N/A] Necrotic Amount: [2:None Present (0%)] [N/A:N/A] Exposed Structures: [2:Fascia: No Fat Layer (Subcutaneous Tissue) Exposed: No Tendon: No Muscle: No Joint: No Bone: No Limited to Skin Breakdown] [N/A:N/A] Epithelialization: Large (67-100%) N/A N/A Periwound Skin Texture: Induration: Yes N/A N/A Scarring: Yes Excoriation: No Callus: No Crepitus: No Rash: No Periwound Skin Moisture: Maceration: No N/A N/A Dry/Scaly: No Periwound Skin Color: Atrophie Blanche: No N/A N/A Cyanosis: No Ecchymosis: No Erythema: No Hemosiderin Staining: No Mottled: No Pallor: No Rubor: No Temperature: No Abnormality N/A N/A Tenderness on Palpation: No N/A N/A Wound Preparation: Ulcer Cleansing: N/A N/A Rinsed/Irrigated with Saline Topical Anesthetic Applied: None Treatment Notes Electronic Signature(s) Signed: 08/03/2017 5:24:01 PM By: Montey Hora Entered By: Montey Hora on 08/03/2017 10:18:59 [name] ([id number]) -------------------------------------------------------------------------------- Multi-Disciplinary Care Plan Details Patient Name: [name] Date of Service: 08/03/2017 10:00 AM Medical Record Number: [id number] Patient Account Number: [id number] Date of Birth/Sex: 1945-09-03 (72 y.o. M) Treating RN: Montey Hora Primary Care Edmonia Gonser: Thomes Lolling Other Clinician: Referring Zyana Amaro: Thomes Lolling Treating Lielle Vandervort/Extender: Melburn Hake, HOYT Weeks in Treatment: 11 Active Inactive ` Abuse / Safety / Falls / Self Care Management Nursing Diagnoses: Impaired physical mobility Goals: Patient will remain injury free Date Initiated: 07/03/2016 Target Resolution Date: 09/14/2016 Goal Status: Active Interventions: Assess fall risk on admission and as needed Notes: ` Orientation to the Wound Care Program Nursing Diagnoses: Knowledge deficit related to the wound healing center program Goals: Patient/caregiver will verbalize understanding of the [place] Date Initiated: 07/03/2016 Target Resolution Date: 09/15/2016 Goal Status: Active Interventions: Provide education on orientation to the wound center Notes: ` Wound/Skin Impairment Nursing Diagnoses: Impaired tissue integrity Goals: Patient/caregiver will verbalize understanding of skin care regimen Date Initiated: 07/03/2016 Target Resolution Date: 09/15/2016 Goal Status: Active Ulcer/skin breakdown will have a volume reduction of 30% by week 4 Date Initiated: 07/03/2016 Target Resolution Date: 09/15/2016 [name], [name] ([id number]) Goal Status: Active Ulcer/skin breakdown will have a volume reduction of 50% by week 8 Date Initiated: 07/03/2016 Target Resolution Date: 09/15/2016 Goal Status: Active Ulcer/skin breakdown will have a volume reduction of 80% by week 12 Date Initiated: 07/03/2016 Target Resolution Date:  09/15/2016 Goal Status: Active Ulcer/skin breakdown will heal within 14 weeks Date Initiated: 07/03/2016 Target Resolution Date: 09/15/2016 Goal Status: Active Interventions: Assess patient/caregiver ability to obtain necessary supplies Assess patient/caregiver ability to perform ulcer/skin care regimen upon admission and as needed Assess ulceration(s) every visit Notes: Electronic Signature(s) Signed: 08/03/2017 5:24:01 PM By: Montey Hora Entered By: Montey Hora on 08/03/2017 10:18:50 [name] ([id number]) -------------------------------------------------------------------------------- Pain Assessment Details Patient Name: [name] Date of Service: 08/03/2017 10:00 AM Medical Record Number: [id number] Patient Account Number: [id number] Date of Birth/Sex: [birth year] (72 y.o. M) Treating RN: Cornell Barman Primary Care Nadav Swindell: Thomes Lolling Other Clinician: Referring Naome Brigandi: Thomes Lolling Treating Rylon Poitra/Extender: Melburn Hake, HOYT Weeks in Treatment: 61 Active Problems Location of Pain Severity and Description of Pain Patient Has Paino No Site Locations With Dressing Change: No Pain Management and Medication Current Pain Management: Electronic Signature(s) Signed: 08/03/2017 6:21:51 PM By: Gretta Cool, BSN, RN, CWS, Kim RN, BSN Entered By: Gretta Cool, BSN, RN, CWS, Kim on 08/03/2017 10:04:01 [name] ([id number]) -------------------------------------------------------------------------------- Patient/Caregiver Education Details Patient Name: [name]  Date of Service: 08/03/2017 10:00 AM Medical Record Number: [id number] Patient Account Number: [id number] Date of Birth/Gender: 1945/07/21 (72 y.o. M) Treating RN: Montey Hora Primary Care Physician: Thomes Lolling Other Clinician: Referring Physician: Thomes Lolling Treating Physician/Extender: Sharalyn Ink in Treatment: 66 Education Assessment Education Provided To: Patient Education Topics  Provided Wound/Skin Impairment: Handouts: Other: wound care as ordered Methods: Demonstration, Explain/Verbal Responses: State content correctly Electronic Signature(s) Signed: 08/03/2017 5:24:01 PM By: Montey Hora Entered By: Montey Hora on 08/03/2017 10:34:14 [name] ([id number]) -------------------------------------------------------------------------------- Wound Assessment Details Patient Name: [name] Date of Service: 08/03/2017 10:00 AM Medical Record Number: [id number] Patient Account Number: [id number] Date of Birth/Sex: [birth year] (72 y.o. M) Treating RN: Cornell Barman Primary Care Francesco Provencal: Thomes Lolling Other Clinician: Referring Kriti Katayama: Thomes Lolling Treating Aslynn Brunetti/Extender: Melburn Hake, HOYT Weeks in Treatment: 31 Wound Status Wound Number: 2 Primary Atypical Etiology: Wound Location: Left Abdomen - midline Wound Open Wounding Event: Gradually Appeared Status: Date Acquired: 05/17/2015 Comorbid Cataracts, Chronic Obstructive Pulmonary Weeks Of Treatment: 56 History: Disease (COPD), Hypertension, Type II Clustered Wound: Yes Diabetes, Osteoarthritis, Neuropathy Photos Photo Uploaded By: Gretta Cool, BSN, RN, CWS, Kim on 08/03/2017 17:38:09 Wound Measurements Length: (cm) 0.2 % Reduct Width: (cm) 0.3 % Reduct Depth: (cm) 0.1 Epitheli Clustered Quantity: 1 Tunnelin Area: (cm) 0.047 Undermi Volume: (cm) 0.005 ion in Area: 99.1% ion in Volume: 99.1% alization: Large (67-100%) g: No ning: No Wound Description Full Thickness Without Exposed Support Foul Od Classification: Structures Slough/ Wound Margin: Flat and Intact Exudate Medium Amount: Exudate Type: Serous Exudate Color: amber or After Cleansing: No Fibrino No Wound Bed Granulation Amount: Large (67-100%) Exposed Structure Granulation Quality: Red Fascia Exposed: No Necrotic Amount: None Present (0%) Fat Layer (Subcutaneous Tissue) Exposed: No Tendon Exposed: No Muscle  Exposed: No Joint Exposed: No [name], [name]. ([id number]) Bone Exposed: No Limited to Skin Breakdown Periwound Skin Texture Texture Color No Abnormalities Noted: No No Abnormalities Noted: No Callus: No Atrophie Blanche: No Crepitus: No Cyanosis: No Excoriation: No Ecchymosis: No Induration: Yes Erythema: No Rash: No Hemosiderin Staining: No Scarring: Yes Mottled: No Pallor: No Moisture Rubor: No No Abnormalities Noted: No Dry / Scaly: No Temperature / Pain Maceration: No Temperature: No Abnormality Wound Preparation Ulcer Cleansing: Rinsed/Irrigated with Saline Topical Anesthetic Applied: None Treatment Notes Wound #2 (Left Abdomen - midline) 1. Cleansed with: Clean wound with Normal Saline 2. Anesthetic Topical Lidocaine 4% cream to wound bed prior to debridement 4. Dressing Applied: Prisma Ag Mepitel 5. Secondary [place] Signature(s) Signed: 08/03/2017 6:21:51 PM By: Gretta Cool, BSN, RN, CWS, Kim RN, BSN Entered By: Gretta Cool, BSN, RN, CWS, Kim on 08/03/2017 10:07:11 [name], [name] ([id number]) -------------------------------------------------------------------------------- [place] Details Patient Name: [name], [name] Date of Service: 08/03/2017 10:00 AM Medical Record Number: [id number] Patient Account Number: [id number] Date of Birth/Sex: [birth year] (72 y.o. M) Treating RN: Cornell Barman Primary Care Asia Favata: Thomes Lolling Other Clinician: Referring Koki Buxton: Thomes Lolling Treating Clytee Heinrich/Extender: Melburn Hake, HOYT Weeks in Treatment: 56 Vital Signs Time Taken: 10:04 Temperature (F): 98.3 Height (in): 65 Pulse (bpm): 101 Weight (lbs): 161 Respiratory Rate (breaths/min): 16 Body Mass Index (BMI): 26.8 Blood Pressure (mmHg): 143/90 Reference Range: 80 - 120 mg / dl Electronic Signature(s) Signed: 08/03/2017 6:21:51 PM By: Gretta Cool, BSN, RN, CWS, Kim RN, BSN Entered By: Gretta Cool, BSN, RN, CWS, Kim on 08/03/2017 10:08:32

## 2017-08-05 NOTE — Progress Notes (Signed)
[name] ([id number]) Visit Report for 08/03/2017 Chief Complaint Document Details Patient Name: [name]. Date of Service: 08/03/2017 10:00 AM Medical Record Number: [id number] Patient Account Number: [id number] Date of Birth/Sex: [birth year] (72 y.o. M) Treating RN: Montey Hora Primary Care Provider: Thomes Lolling Other Clinician: Referring Provider: Thomes Lolling Treating Provider/Extender: Sharalyn Ink in Treatment: 50 Information Obtained from: Patient Chief Complaint Patients presents for treatment of an open diabetic ulcer to the anterior abdominal wall in the epigastric and right upper quadrant Electronic Signature(s) Signed: 08/03/2017 9:42:33 PM By: Worthy Keeler PA-C Entered By: Worthy Keeler on 08/03/2017 10:14:26 [name] ([id number]) -------------------------------------------------------------------------------- HPI Details Patient Name: [name] Date of Service: 08/03/2017 10:00 AM Medical Record Number: [id number] Patient Account Number: [id number] Date of Birth/Sex: 1945-04-17 (72 y.o. M) Treating RN: Montey Hora Primary Care Provider: Thomes Lolling Other Clinician: Referring Provider: Thomes Lolling Treating Provider/Extender: Melburn Hake, HOYT Weeks in Treatment: 65 History of Present Illness HPI Description: 72 year old gentleman has been seen recently by his PCP Dr. Thomes Lolling, who sees him with a history of diabetes mellitus, peripheral neuropathy, B12 deficiency, hypertension, osteoarthritis and the patient had recent blood sugar checked which was 163. Past medical history includes essential hypertension, incisional hernia, diabetes mellitus, colon polyps, COPD, tobacco abuse in the past, chewing tobacco, GERD. He has also been treated for chronic back pain with no sciatica and is on oxycodone. His last hemoglobin A1c was 6.9% His abdominal surgery was done at [facility] over 15 years ago and gradually there was  sutures which protruded and then there was mesh which protruded. He washes this during his shower but does not put any dressing and the wound is covered with a lot of debris. He has never had a surgical consultation for this. 07/10/2016 -- the patient has spoken to his PCP who said he was going to call me but I have not yet had a phone conversation with the physician. From what I understand the physician was reluctant to have a surgical opinion because of the patient's several comorbidities and he feels that the surgery may be detrimental to his overall health 07/24/2016 -- the patient's daughter is at the bedside and we've had a detailed discussion regarding his options for a surgical opinion and possible surgical intervention before this becomes a emergent situation. She will seek primary care input and take the father to [facility] for a surgical opinion. 07/31/2016 -- the patient's daughter has organized for a surgical opinion at [facility] on June 14 08/14/2016 -- it has been about 3 weeks since he has stopped chewing tobacco. 08/28/2016 -- he was seen at [facility] by Dr. Roby Lofts -- the assessment was based on physical examination and review of his previous history of surgical repair. the patient had undergone a ventral hernia repair in 1998 with Dr. Quay Burow at [facility] at a prior open cholecystectomy incision. he was evaluated by Dr. Denice Paradise in 2013 and at that time it was decided to watch and wait. Dr. Payton Doughty recommended a repeat CT scan and requested records from dura and regional operative records and would follow-up the patient, after Mr. Kunath PCP Dr. Gwenlyn Saran reviewed him for overall fitness for surgery. 09/04/16 patient's wound actually appear to be doing well on evaluation today. He is not really experience any discomfort and the right abdominal wound is dry and almost appears to be healing up. He continues to have some drainage from the left but this  seems to be more  of a potential seroma which with milking is able to be cleared out. I discussed with patient that when he performs his dressing changes at home it would be beneficial for him to do this as well. Otherwise there's no evidence of infection. He is still waiting on his CT scan and appointments to determine whether he is going to have another surgery. 09/11/2016 -- the patient has had a CT scan and is awaiting his surgical appointment prior to deciding on any surgical intervention. 09/25/16 patient presents today for fault evaluation concerning his ongoing abdominal surgical wounds. Unfortunately they do not appear to be improving significantly but rather fluctuate from a little better to little worse week by week. Obviously this is somewhat frustrating for him. 10/02/2016 -- the patient has completed his CT scan, and has a review by the surgeons at the end of August. He has a new area which has opened up a little below his left abdominal wound. 10/16/16 on evaluation today patient's wounds on the abdominal region appears to be doing about the same. He actually has an appointment with his surgeon on the 21st for evaluation regarding the required products appointment. Obviously he is having a difficult time with the feeling of these wounds. Fortunately there does not appear to be any evidence of local infection such as significant amount of erythema or streaking. She also has an audio, vomiting, or diarrhea. He is not have any discomfort at this point. Michael, Jackson ([id number]) 10/30/2016 -- the surgical review is tomorrow and I am awaiting the input of the surgeon. 11/16/2016 -- I had a call from his surgeon Dr. Payton Doughty, on phone number [phone] who was kind enough to call me regarding his care. After a thorough review and having consulted the abdominal wall reconstruction team they have decided that surgery will be too extensive and rather moribund for this gentleman and have asked him to continue  with local care at the wound center. She will send me an official note via Epic. 03/09/2017 -- the patient comes for some abdominal wall wounds which were caused by protruding mesh and complete surgical review was done at [facility] and no surgery has been recommended. He continues to get palliative care and the right abdominal wound is completely healed. He has a few open spots on the left which are being treated with palliative care and we see him once a month 05/04/17 on evaluation today Mr. Briner presents for follow-up concerning his abdominal surgical ulcer. He does have some additional mashed taking out of the wound at this point although he has made a lot of progress since last time I saw him. There is good epithelialization in a lot of areas although there is still mesh poking through which is preventing complete closure. Fortunately there does not appear to be any evidence of infection. 05/18/17 on evaluation today patient appears to be doing very well in regard to his Adamo ulcer. Of the two areas that were previously open one has completely close in the proximal portion of the wound and the remainder of the opening which is the inferior portion is significantly smaller and there does not appear to be a lot of significant issue with mesh poking through the skin currently. In fact this is a nice rather circular indention where he has granulation in the base. There was some of the mesh still showing through but very little. Overall he has made significant improvements in my opinion since even  two weeks ago when I saw him. 06/22/17 on evaluation today patient's abdominal ulcer on inspection appears to be doing much better. He still has 2 tiny openings at either end of the surgical site where he has scar tissue noted at this point. Fortunately there does not appear to be any evidence of infection which is good news. He does still have some evidence of the mesh showing which still seems  to be preventing this from fully closing but fortunately this seems to be minimal even compared to my last evaluation. Overall he has been doing very well. 07/06/17 on evaluation today patient's abdominal ulcer actually appears to be doing much better. He's showing signs of improvement little by little. Overall I feel this is appropriate as far as the progression that has been made. With that being said it's still been a very slow process. There's no evidence of infection. 07/20/17 on evaluation today patient appears to be doing rather well in regard to his left abdominal ulcer. He has been tolerating the dressing changes without complication fortunately there does not appear to be any evidence of infection. He still has two small openings although both of these appear to have filled in much more nicely compared to last week's evaluation. There is no obvious exposed mesh at this point. 08/03/17 on evaluation today the patient's wound on the abdominal region appears to be doing excellent. In fact the upper portion which was still open show signs of being completely epithelial eyes which is good news. He still does have a small opening on the inferior portion but overall things have progressed extremely well in my opinion. Overall I'm hoping that she will see this completely close over the next couple of weeks. Electronic Signature(s) Signed: 08/03/2017 9:42:33 PM By: Worthy Keeler PA-C Entered By: Worthy Keeler on 08/03/2017 11:41:41 [name] ([id number]) -------------------------------------------------------------------------------- Physical Exam Details Patient Name: Michael, Jackson Date of Service: 08/03/2017 10:00 AM Medical Record Number: [id number] Patient Account Number: [id number] Date of Birth/Sex: 12-05-1945 (72 y.o. M) Treating RN: Montey Hora Primary Care Provider: Thomes Lolling Other Clinician: Referring Provider: Thomes Lolling Treating Provider/Extender: Melburn Hake,  HOYT Weeks in Treatment: 70 Constitutional Well-nourished and well-hydrated in no acute distress. Respiratory normal breathing without difficulty. clear to auscultation bilaterally. Cardiovascular regular rate and rhythm with normal S1, S2. Psychiatric this patient is able to make decisions and demonstrates good insight into disease process. Alert and Oriented x 3. pleasant and cooperative. Notes Patient has just a very slight opening on the inferior portion of the wound which is still present at this time. With that being said in regard to the upper wound region this seems to be doing excellent in my opinion. I think that the biggest thing in that area would be just to utilize the mepitel. This will protect the area from further breakdown. Electronic Signature(s) Signed: 08/03/2017 9:42:33 PM By: Worthy Keeler PA-C Entered By: Worthy Keeler on 08/03/2017 11:42:28 [name] ([id number]) -------------------------------------------------------------------------------- Physician Orders Details Patient Name: Michael, Jackson Date of Service: 08/03/2017 10:00 AM Medical Record Number: [id number] Patient Account Number: [id number] Date of Birth/Sex: [birth year] (72 y.o. M) Treating RN: Montey Hora Primary Care Provider: Thomes Lolling Other Clinician: Referring Provider: Thomes Lolling Treating Provider/Extender: Sharalyn Ink in Treatment: 35 Verbal / Phone Orders: No Diagnosis Coding ICD-10 Coding Code Description E11.622 Type 2 diabetes mellitus with other skin ulcer Unspecified open wound of abdominal wall, right upper quadrant without penetration into peritoneal S31.100A  cavity, initial encounter Unspecified open wound of abdominal wall, epigastric region without penetration into peritoneal cavity, S31.102A initial encounter F17.228 Nicotine dependence, chewing tobacco, with other nicotine-induced disorders Wound Cleansing Wound #2 Left Abdomen - midline o  Clean wound with Normal Saline. o May Shower, gently pat wound dry prior to applying new dressing. Skin Barriers/Peri-Wound Care Wound #2 Left Abdomen - midline o Skin Prep Primary Wound Dressing Wound #2 Left Abdomen - midline o Mepitel One Contact layer o Silver Collagen Secondary Dressing Wound #2 Left Abdomen - midline o Other - t[place]a [place] Dressing Change Frequency Wound #2 Left Abdomen - midline o Change dressing every other day. Follow-up Appointments Wound #2 Left Abdomen - midline o Return Appointment in 2 weeks. Electronic Signature(s) Signed: 08/03/2017 5:24:01 PM By: Montey Hora Signed: 08/03/2017 9:42:33 PM By: Worthy Keeler PA-C Entered By: Montey Hora on 08/03/2017 10:20:00 Michael, Jackson ([id number], GENOVA ([id number]) -------------------------------------------------------------------------------- Problem List Details Patient Name: ATIBA, KIMBERLIN Date of Service: 08/03/2017 10:00 AM Medical Record Number: [id number] Patient Account Number: [id number] Date of Birth/Sex: May 13, 1945 (72 y.o. M) Treating RN: Montey Hora Primary Care Provider: Thomes Lolling Other Clinician: Referring Provider: Thomes Lolling Treating Provider/Extender: Sharalyn Ink in Treatment: 29 Active Problems ICD-10 Impacting Encounter Code Description Active Date Wound Healing Diagnosis E11.622 Type 2 diabetes mellitus with other skin ulcer 07/03/2016 Yes S31.100A Unspecified open wound of abdominal wall, right upper 07/03/2016 Yes quadrant without penetration into peritoneal cavity, initial encounter S31.102A Unspecified open wound of abdominal wall, epigastric region 07/03/2016 Yes without penetration into peritoneal cavity, initial encounter F17.228 Nicotine dependence, chewing tobacco, with other nicotine- 07/03/2016 Yes induced disorders Inactive Problems Resolved Problems Electronic Signature(s) Signed: 08/03/2017 9:42:33 PM By: Worthy Keeler PA-C Entered By: Worthy Keeler on 08/03/2017 10:14:19 [name] ([id number]) -------------------------------------------------------------------------------- Progress Note Details Patient Name: [name] Date of Service: 08/03/2017 10:00 AM Medical Record Number: [id number] Patient Account Number: [id number] Date of Birth/Sex: [birth year] (72 y.o. M) Treating RN: Montey Hora Primary Care Provider: Thomes Lolling Other Clinician: Referring Provider: Thomes Lolling Treating Provider/Extender: Sharalyn Ink in Treatment: 32 Subjective Chief Complaint Information obtained from Patient Patients presents for treatment of an open diabetic ulcer to the anterior abdominal wall in the epigastric and right upper quadrant History of Present Illness (HPI) 72 year old gentleman has been seen recently by his PCP Dr. Thomes Lolling, who sees him with a history of diabetes mellitus, peripheral neuropathy, B12 deficiency, hypertension, osteoarthritis and the patient had recent blood sugar checked which was 163. Past medical history includes essential hypertension, incisional hernia, diabetes mellitus, colon polyps, COPD, tobacco abuse in the past, chewing tobacco, GERD. He has also been treated for chronic back pain with no sciatica and is on oxycodone. His last hemoglobin A1c was 6.9% His abdominal surgery was done at [facility] over 15 years ago and gradually there was sutures which protruded and then there was mesh which protruded. He washes this during his shower but does not put any dressing and the wound is covered with a lot of debris. He has never had a surgical consultation for this. 07/10/2016 -- the patient has spoken to his PCP who said he was going to call me but I have not yet had a phone conversation with the physician. From what I understand the physician was reluctant to have a surgical opinion because of the patient's several comorbidities and he feels that the  surgery may be detrimental to his overall health  07/24/2016 -- the patient's daughter is at the bedside and we've had a detailed discussion regarding his options for a surgical opinion and possible surgical intervention before this becomes a emergent situation. She will seek primary care input and take the father to [facility] for a surgical opinion. 07/31/2016 -- the patient's daughter has organized for a surgical opinion at [facility] on June 14 08/14/2016 -- it has been about 3 weeks since he has stopped chewing tobacco. 08/28/2016 -- he was seen at [facility] by Dr. Roby Lofts -- the assessment was based on physical examination and review of his previous history of surgical repair. the patient had undergone a ventral hernia repair in 1998 with Dr. Quay Burow at [facility] at a prior open cholecystectomy incision. he was evaluated by Dr. Denice Paradise in 2013 and at that time it was decided to watch and wait. Dr. Payton Doughty recommended a repeat CT scan and requested records from dura and regional operative records and would follow-up the patient, after Mr. Hippler PCP Dr. Gwenlyn Saran reviewed him for overall fitness for surgery. 09/04/16 patient's wound actually appear to be doing well on evaluation today. He is not really experience any discomfort and the right abdominal wound is dry and almost appears to be healing up. He continues to have some drainage from the left but this seems to be more of a potential seroma which with milking is able to be cleared out. I discussed with patient that when he performs his dressing changes at home it would be beneficial for him to do this as well. Otherwise there's no evidence of infection. He is still waiting on his CT scan and appointments to determine whether he is going to have another surgery. 09/11/2016 -- the patient has had a CT scan and is awaiting his surgical appointment prior to deciding on any surgical intervention. 09/25/16 patient presents today for  fault evaluation concerning his ongoing abdominal surgical wounds. Unfortunately they do not appear to be improving significantly but rather fluctuate from a little better to little worse week by week. Obviously this is somewhat frustrating for him. 10/02/2016 -- the patient has completed his CT scan, and has a review by the surgeons at the end of August. He has a new Michael, Jackson. ([id number]) area which has opened up a little below his left abdominal wound. 10/16/16 on evaluation today patient's wounds on the abdominal region appears to be doing about the same. He actually has an appointment with his surgeon on the 21st for evaluation regarding the required products appointment. Obviously he is having a difficult time with the feeling of these wounds. Fortunately there does not appear to be any evidence of local infection such as significant amount of erythema or streaking. She also has an audio, vomiting, or diarrhea. He is not have any discomfort at this point. 10/30/2016 -- the surgical review is tomorrow and I am awaiting the input of the surgeon. 11/16/2016 -- I had a call from his surgeon Dr. Payton Doughty, on phone number [phone] who was kind enough to call me regarding his care. After a thorough review and having consulted the abdominal wall reconstruction team they have decided that surgery will be too extensive and rather moribund for this gentleman and have asked him to continue with local care at the wound center. She will send me an official note via Epic. 03/09/2017 -- the patient comes for some abdominal wall wounds which were caused by protruding mesh and complete surgical review was done at  [facility] and no surgery has been recommended. He continues to get palliative care and the right abdominal wound is completely healed. He has a few open spots on the left which are being treated with palliative care and we see him once a month 05/04/17 on evaluation today Mr. Campi  presents for follow-up concerning his abdominal surgical ulcer. He does have some additional mashed taking out of the wound at this point although he has made a lot of progress since last time I saw him. There is good epithelialization in a lot of areas although there is still mesh poking through which is preventing complete closure. Fortunately there does not appear to be any evidence of infection. 05/18/17 on evaluation today patient appears to be doing very well in regard to his Adamo ulcer. Of the two areas that were previously open one has completely close in the proximal portion of the wound and the remainder of the opening which is the inferior portion is significantly smaller and there does not appear to be a lot of significant issue with mesh poking through the skin currently. In fact this is a nice rather circular indention where he has granulation in the base. There was some of the mesh still showing through but very little. Overall he has made significant improvements in my opinion since even two weeks ago when I saw him. 06/22/17 on evaluation today patient's abdominal ulcer on inspection appears to be doing much better. He still has 2 tiny openings at either end of the surgical site where he has scar tissue noted at this point. Fortunately there does not appear to be any evidence of infection which is good news. He does still have some evidence of the mesh showing which still seems to be preventing this from fully closing but fortunately this seems to be minimal even compared to my last evaluation. Overall he has been doing very well. 07/06/17 on evaluation today patient's abdominal ulcer actually appears to be doing much better. He's showing signs of improvement little by little. Overall I feel this is appropriate as far as the progression that has been made. With that being said it's still been a very slow process. There's no evidence of infection. 07/20/17 on evaluation today patient  appears to be doing rather well in regard to his left abdominal ulcer. He has been tolerating the dressing changes without complication fortunately there does not appear to be any evidence of infection. He still has two small openings although both of these appear to have filled in much more nicely compared to last week's evaluation. There is no obvious exposed mesh at this point. 08/03/17 on evaluation today the patient's wound on the abdominal region appears to be doing excellent. In fact the upper portion which was still open show signs of being completely epithelial eyes which is good news. He still does have a small opening on the inferior portion but overall things have progressed extremely well in my opinion. Overall I'm hoping that she will see this completely close over the next couple of weeks. Patient History Information obtained from Patient. Social History Former smoker - chew tobacco, Marital Status - Widowed, Alcohol Use - Never, Drug Use - No History, Caffeine Use - Daily. Medical And Surgical History Notes Michael, Jackson ([id number]) Oncologic 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 Review of Systems (ROS) Constitutional Symptoms (General Health) Denies complaints or symptoms of Fever, Chills. Respiratory The patient has no complaints or symptoms. Cardiovascular The patient  has no complaints or symptoms. Psychiatric The patient has no complaints or symptoms. Objective Constitutional Well-nourished and well-hydrated in no acute distress. Vitals Time Taken: 10:04 AM, Height: 65 in, Weight: 161 lbs, BMI: 26.8, Temperature: 98.3 F, Pulse: 101 bpm, Respiratory Rate: 16 breaths/min, Blood Pressure: 143/90 mmHg. Respiratory normal breathing without difficulty. clear to auscultation bilaterally. Cardiovascular regular rate and rhythm with normal S1, S2. Psychiatric this patient is able to make decisions and demonstrates good insight into disease process. Alert and  Oriented x 3. pleasant and cooperative. General Notes: Patient has just a very slight opening on the inferior portion of the wound which is still present at this time. With that being said in regard to the upper wound region this seems to be doing excellent in my opinion. I think that the biggest thing in that area would be just to utilize the mepitel. This will protect the area from further breakdown. Integumentary (Hair, Skin) Wound #2 status is Open. Original cause of wound was Gradually Appeared. The wound is located on the Left Abdomen - midline. The wound measures 0.2cm length x 0.3cm width x 0.1cm depth; 0.047cm^2 area and 0.005cm^3 volume. The wound is limited to skin breakdown. There is no tunneling or undermining noted. There is a medium amount of serous drainage noted. The wound margin is flat and intact. There is large (67-100%) red granulation within the wound bed. There is no necrotic tissue within the wound bed. The periwound skin appearance exhibited: Induration, Scarring. The periwound skin appearance did not exhibit: Callus, Crepitus, Excoriation, Rash, Dry/Scaly, Maceration, Atrophie Blanche, Cyanosis, Ecchymosis, Hemosiderin Staining, Mottled, Pallor, Rubor, Erythema. Periwound temperature was noted as No Abnormality. Michael, Jackson ([id number]) Assessment Active Problems ICD-10 E11.622 - Type 2 diabetes mellitus with other skin ulcer S31.100A - Unspecified open wound of abdominal wall, right upper quadrant without penetration into peritoneal cavity, initial encounter S31.102A - Unspecified open wound of abdominal wall, epigastric region without penetration into peritoneal cavity, initial encounter F17.228 - Nicotine dependence, chewing tobacco, with other nicotine-induced disorders Plan Wound Cleansing: Wound #2 Left Abdomen - midline: Clean wound with Normal Saline. May Shower, gently pat wound dry prior to applying new dressing. Skin Barriers/Peri-Wound Care: Wound  #2 Left Abdomen - midline: Skin Prep Primary Wound Dressing: Wound #2 Left Abdomen - midline: Mepitel One Contact layer Silver Collagen Secondary Dressing: Wound #2 Left Abdomen - midline: Other - [place] Dressing Change Frequency: Wound #2 Left Abdomen - midline: Change dressing every other day. Follow-up Appointments: Wound #2 Left Abdomen - midline: Return Appointment in 2 weeks. I'm gonna recommend currently that we continue with the Current wound care measures for the next week. He is in agreement with this plan. We will actually see him in fact for a fault visit in two weeks time. If all goes well I'm hopeful that he may be healed at that point. Please see above for specific wound care orders. We will see patient for re-evaluation in 2 week(s) here in the clinic. If anything worsens or changes patient will contact our office for additional recommendations. Electronic Signature(s) Signed: 08/03/2017 9:42:33 PM By: Worthy Keeler PA-C Entered By: Worthy Keeler on 08/03/2017 11:42:59 [name] ([id number]) -------------------------------------------------------------------------------- ROS/PFSH Details Patient Name: [name] Date of Service: 08/03/2017 10:00 AM Medical Record Number: [id number] Patient Account Number: [id number] Date of Birth/Sex: Dec 13, 1945 (72 y.o. M) Treating RN: Montey Hora Primary Care Provider: Thomes Lolling Other Clinician: Referring Provider: Thomes Lolling Treating Provider/Extender: Melburn Hake, HOYT Weeks in  Treatment: 40 Information Obtained From Patient Wound History Do you currently have one or more open woundso Yes How many open wounds do you currently haveo 2 Approximately how long have you had your woundso more than 1 year How have you been treating your wound(s) until nowo open to air Has your wound(s) ever healed and then re-openedo No Have you had any lab work done in the past montho No Have you tested positive for  an antibiotic resistant organism (MRSA, VRE)o No Have you tested positive for osteomyelitis (bone infection)o No Have you had any tests for circulation on your legso No Constitutional Symptoms (General Health) Complaints and Symptoms: Negative for: Fever; Chills Eyes Medical History: Positive for: Cataracts - removed Hematologic/Lymphatic Medical History: Negative for: Anemia; Hemophilia; Human Immunodeficiency Virus; Lymphedema; Sickle Cell Disease Respiratory Complaints and Symptoms: No Complaints or Symptoms Medical History: Positive for: Chronic Obstructive Pulmonary Disease (COPD) Negative for: Aspiration; Asthma; Pneumothorax; Sleep Apnea; Tuberculosis Cardiovascular Complaints and Symptoms: No Complaints or Symptoms Medical History: Positive for: Hypertension Negative for: Angina; Arrhythmia; Congestive Heart Failure; Coronary Artery Disease; Deep Vein Thrombosis; Hypotension; Myocardial Infarction; Peripheral Arterial Disease; Peripheral Venous Disease; Phlebitis; Vasculitis Gastrointestinal ADELAIDO, NICKLAUS ([id number]) Medical History: Negative for: Cirrhosis ; Colitis; Crohnos; Hepatitis A; Hepatitis B; Hepatitis C Endocrine Medical History: Positive for: Type II Diabetes Treated with: Oral agents Blood sugar tested every day: No Genitourinary Medical History: Negative for: End Stage Renal Disease Immunological Medical History: Negative for: Lupus Erythematosus; Raynaudos; Scleroderma Integumentary (Skin) Medical History: Negative for: History of Burn; History of pressure wounds Musculoskeletal Medical History: Positive for: Osteoarthritis Negative for: Gout; Rheumatoid Arthritis; Osteomyelitis Neurologic Medical History: Positive for: Neuropathy Negative for: Dementia; Quadriplegia; Paraplegia; Seizure Disorder Oncologic Medical History: Negative for: Received Chemotherapy; Received Radiation Past Medical History Notes: 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 Psychiatric Complaints and Symptoms: No Complaints or Symptoms Medical History: Negative for: Anorexia/bulimia; Confinement Anxiety HBO Extended History Items Eyes: Cataracts Immunizations Pneumococcal Vaccine: Michael, Jackson ([id number]) Received Pneumococcal Vaccination: Yes Immunization Notes: up to date Implantable Devices Family and Social History Former smoker - chew tobacco; Marital Status - Widowed; Alcohol Use: Never; Drug Use: No History; Caffeine Use: Daily; Financial Concerns: No; Food, Clothing or Shelter Needs: No; Support System Lacking: No; Transportation Concerns: No; Advanced Directives: No; Patient does not want information on Advanced Directives Physician Affirmation I have reviewed and agree with the above information. Electronic Signature(s) Signed: 08/03/2017 5:24:01 PM By: Montey Hora Signed: 08/03/2017 9:42:33 PM By: Worthy Keeler PA-C Entered By: Worthy Keeler on 08/03/2017 11:42:09 Michael, Jackson ([id number]) -------------------------------------------------------------------------------- SuperBill Details Patient Name: Michael, Jackson Date of Service: 08/03/2017 Medical Record Number: [id number] Patient Account Number: [id number] Date of Birth/Sex: October 22, 1945 (72 y.o. M) Treating RN: Montey Hora Primary Care Provider: Thomes Lolling Other Clinician: Referring Provider: Thomes Lolling Treating Provider/Extender: Melburn Hake, HOYT Weeks in Treatment: 34 Diagnosis Coding ICD-10 Codes Code Description E11.622 Type 2 diabetes mellitus with other skin ulcer Unspecified open wound of abdominal wall, right upper quadrant without penetration into peritoneal S31.100A cavity, initial encounter Unspecified open wound of abdominal wall, epigastric region without penetration into peritoneal cavity, S31.102A initial encounter F17.228 Nicotine dependence, chewing tobacco, with other nicotine-induced disorders Facility Procedures CPT4  Code: 00867619 Description: 50932 - WOUND CARE VISIT-LEV 2 EST PT Modifier: Quantity: 1 Physician Procedures CPT4: Description Modifier Quantity Code [id number] - WC PHYS LEVEL 3 - EST PT 1 ICD-10 Diagnosis Description E11.622 Type 2 diabetes mellitus with other  skin ulcer S31.100A Unspecified open wound of abdominal wall, right upper quadrant without  penetration into peritoneal cavity, initial encounter S31.102A Unspecified open wound of abdominal wall, epigastric region without penetration into peritoneal cavity, initial encounter F17.228 Nicotine dependence, chewing tobacco, with other  nicotine-induced disorders Electronic Signature(s) Signed: 08/03/2017 9:42:33 PM By: Worthy Keeler PA-C Entered By: Worthy Keeler on 08/03/2017 11:43:13

## 2017-08-13 MED ORDER — GABAPENTIN 300 MG CAPSULE
ORAL_CAPSULE | 3 refills | 0 days | Status: CP
Start: 2017-08-13 — End: 2018-06-17

## 2017-08-13 MED ORDER — GLIPIZIDE 5 MG TABLET
ORAL_TABLET | 1 refills | 0 days | Status: CP
Start: 2017-08-13 — End: 2018-02-19

## 2017-08-13 MED ORDER — ATORVASTATIN 40 MG TABLET
ORAL_TABLET | 3 refills | 0 days | Status: CP
Start: 2017-08-13 — End: 2018-08-14

## 2017-08-17 ENCOUNTER — Encounter: Payer: Medicare HMO | Attending: Nurse Practitioner | Admitting: Nurse Practitioner

## 2017-08-17 DIAGNOSIS — Y929 Unspecified place or not applicable: Secondary | ICD-10-CM | POA: Diagnosis not present

## 2017-08-17 DIAGNOSIS — I10 Essential (primary) hypertension: Secondary | ICD-10-CM | POA: Insufficient documentation

## 2017-08-17 DIAGNOSIS — Z9889 Other specified postprocedural states: Secondary | ICD-10-CM | POA: Diagnosis not present

## 2017-08-17 DIAGNOSIS — X58XXXA Exposure to other specified factors, initial encounter: Secondary | ICD-10-CM | POA: Insufficient documentation

## 2017-08-17 DIAGNOSIS — J449 Chronic obstructive pulmonary disease, unspecified: Secondary | ICD-10-CM | POA: Diagnosis not present

## 2017-08-17 DIAGNOSIS — Z87891 Personal history of nicotine dependence: Secondary | ICD-10-CM | POA: Insufficient documentation

## 2017-08-17 DIAGNOSIS — S31109A Unspecified open wound of abdominal wall, unspecified quadrant without penetration into peritoneal cavity, initial encounter: Secondary | ICD-10-CM | POA: Insufficient documentation

## 2017-08-17 DIAGNOSIS — E11622 Type 2 diabetes mellitus with other skin ulcer: Secondary | ICD-10-CM | POA: Insufficient documentation

## 2017-08-20 NOTE — Progress Notes (Signed)
[name] ([id number]) Visit Report for 08/17/2017 Chief Complaint Document Details Patient Name: [name], [name]. Date of Service: 08/17/2017 10:00 AM Medical Record Number: [id number] Patient Account Number: [id number] Date of Birth/Sex: 1945-07-04 (72 y.o. M) Treating RN: Montey Hora Primary Care Provider: Thomes Lolling Other Clinician: Referring Provider: Thomes Lolling Treating Provider/Extender: Cathie Olden in Treatment: 58 Information Obtained from: Patient Chief Complaint anterior abdominal wall in the epigastric region Electronic Signature(s) Signed: 08/17/2017 10:29:44 AM By: Lawanda Cousins Entered By: Lawanda Cousins on 08/17/2017 10:29:44 [name] ([id number]) -------------------------------------------------------------------------------- HPI Details Patient Name: [name] Date of Service: 08/17/2017 10:00 AM Medical Record Number: [id number] Patient Account Number: [id number] Date of Birth/Sex: 12/29/1945 (72 y.o. M) Treating RN: Montey Hora Primary Care Provider: Thomes Lolling Other Clinician: Referring Provider: Thomes Lolling Treating Provider/Extender: Cathie Olden in Treatment: 16 History of Present Illness HPI Description: 72 year old gentleman has been seen recently by his PCP Dr. Thomes Lolling, who sees him with a history of diabetes mellitus, peripheral neuropathy, B12 deficiency, hypertension, osteoarthritis and the patient had recent blood sugar checked which was 163. Past medical history includes essential hypertension, incisional hernia, diabetes mellitus, colon polyps, COPD, tobacco abuse in the past, chewing tobacco, GERD. He has also been treated for chronic back pain with no sciatica and is on oxycodone. His last hemoglobin A1c was 6.9% His abdominal surgery was done at [facility] over 15 years ago and gradually there was sutures which protruded and then there was mesh which protruded. He washes this during his shower but  does not put any dressing and the wound is covered with a lot of debris. He has never had a surgical consultation for this. 07/10/2016 -- the patient has spoken to his PCP who said he was going to call me but I have not yet had a phone conversation with the physician. From what I understand the physician was reluctant to have a surgical opinion because of the patient's several comorbidities and he feels that the surgery may be detrimental to his overall health 07/24/2016 -- the patient's daughter is at the bedside and we've had a detailed discussion regarding his options for a surgical opinion and possible surgical intervention before this becomes a emergent situation. She will seek primary care input and take the father to [facility] for a surgical opinion. 07/31/2016 -- the patient's daughter has organized for a surgical opinion at [facility] on June 14 08/14/2016 -- it has been about 3 weeks since he has stopped chewing tobacco. 08/28/2016 -- he was seen at [facility] by Dr. Roby Lofts -- the assessment was based on physical examination and review of his previous history of surgical repair. the patient had undergone a ventral hernia repair in 1998 with Dr. Quay Burow at [facility] at a prior open cholecystectomy incision. he was evaluated by Dr. Denice Paradise in 2013 and at that time it was decided to watch and wait. Dr. Payton Doughty recommended a repeat CT scan and requested records from dura and regional operative records and would follow-up the patient, after Mr. Sipp PCP Dr. Gwenlyn Saran reviewed him for overall fitness for surgery. 09/04/16 patient's wound actually appear to be doing well on evaluation today. He is not really experience any discomfort and the right abdominal wound is dry and almost appears to be healing up. He continues to have some drainage from the left but this seems to be more of a potential seroma which with milking is able to be cleared out. I discussed  with patient that when  he performs his dressing changes at home it would be beneficial for him to do this as well. Otherwise there's no evidence of infection. He is still waiting on his CT scan and appointments to determine whether he is going to have another surgery. 09/11/2016 -- the patient has had a CT scan and is awaiting his surgical appointment prior to deciding on any surgical intervention. 09/25/16 patient presents today for fault evaluation concerning his ongoing abdominal surgical wounds. Unfortunately they do not appear to be improving significantly but rather fluctuate from a little better to little worse week by week. Obviously this is somewhat frustrating for him. 10/02/2016 -- the patient has completed his CT scan, and has a review by the surgeons at the end of August. He has a new area which has opened up a little below his left abdominal wound. 10/16/16 on evaluation today patient's wounds on the abdominal region appears to be doing about the same. He actually has an appointment with his surgeon on the 21st for evaluation regarding the required products appointment. Obviously he is having a difficult time with the feeling of these wounds. Fortunately there does not appear to be any evidence of local infection such as significant amount of erythema or streaking. She also has an audio, vomiting, or diarrhea. He is not have any discomfort at this point. [name], [name] ([id number]) 10/30/2016 -- the surgical review is tomorrow and I am awaiting the input of the surgeon. 11/16/2016 -- I had a call from his surgeon Dr. Payton Doughty, on phone number [phone] who was kind enough to call me regarding his care. After a thorough review and having consulted the abdominal wall reconstruction team they have decided that surgery will be too extensive and rather moribund for this gentleman and have asked him to continue with local care at the wound center. She will send me an official note via Epic. 03/09/2017 -- the  patient comes for some abdominal wall wounds which were caused by protruding mesh and complete surgical review was done at [facility] and no surgery has been recommended. He continues to get palliative care and the right abdominal wound is completely healed. He has a few open spots on the left which are being treated with palliative care and we see him once a month 05/04/17 on evaluation today Mr. Brar presents for follow-up concerning his abdominal surgical ulcer. He does have some additional mashed taking out of the wound at this point although he has made a lot of progress since last time I saw him. There is good epithelialization in a lot of areas although there is still mesh poking through which is preventing complete closure. Fortunately there does not appear to be any evidence of infection. 05/18/17 on evaluation today patient appears to be doing very well in regard to his Adamo ulcer. Of the two areas that were previously open one has completely close in the proximal portion of the wound and the remainder of the opening which is the inferior portion is significantly smaller and there does not appear to be a lot of significant issue with mesh poking through the skin currently. In fact this is a nice rather circular indention where he has granulation in the base. There was some of the mesh still showing through but very little. Overall he has made significant improvements in my opinion since even two weeks ago when I saw him. 06/22/17 on evaluation today patient's abdominal ulcer on inspection appears to be  doing much better. He still has 2 tiny openings at either end of the surgical site where he has scar tissue noted at this point. Fortunately there does not appear to be any evidence of infection which is good news. He does still have some evidence of the mesh showing which still seems to be preventing this from fully closing but fortunately this seems to be minimal even compared to my  last evaluation. Overall he has been doing very well. 07/06/17 on evaluation today patient's abdominal ulcer actually appears to be doing much better. He's showing signs of improvement little by little. Overall I feel this is appropriate as far as the progression that has been made. With that being said it's still been a very slow process. There's no evidence of infection. 07/20/17 on evaluation today patient appears to be doing rather well in regard to his left abdominal ulcer. He has been tolerating the dressing changes without complication fortunately there does not appear to be any evidence of infection. He still has two small openings although both of these appear to have filled in much more nicely compared to last week's evaluation. There is no obvious exposed mesh at this point. 08/03/17 on evaluation today the patient's wound on the abdominal region appears to be doing excellent. In fact the upper portion which was still open show signs of being completely epithelial eyes which is good news. He still does have a small opening on the inferior portion but overall things have progressed extremely well in my opinion. Overall I'm hoping that she will see this completely close over the next couple of weeks. 08/17/17-He is here for preparation for an abdominal wound, there are 2 areas without complete epithelialization; the proximal area is fluid-filled and hyper granular, treated with silver nitrate, the distal aspect has a thin crusted layer of epithelium. There is no erythema to indicate infection. We will change treatment plan and he will follow-up next week Electronic Signature(s) Signed: 08/17/2017 10:31:00 AM By: Lawanda Cousins Entered By: Lawanda Cousins on 08/17/2017 10:30:59 [name] ([id number]) -------------------------------------------------------------------------------- Otelia Sergeant TISS Details Patient Name: [name] Date of Service: 08/17/2017 10:00 AM Medical  Record Number: [id number] Patient Account Number: [id number] Date of Birth/Sex: 1945-05-15 (72 y.o. M) Treating RN: Montey Hora Primary Care Provider: Thomes Lolling Other Clinician: Referring Provider: Thomes Lolling Treating Provider/Extender: Cathie Olden in Treatment: 58 Procedure Performed for: Wound #2 Left Abdomen - midline Performed By: Physician Lawanda Cousins, NP Post Procedure Diagnosis Same as Pre-procedure Notes silver nitrate stick used for chemical cauterization of hypergranular tissue Electronic Signature(s) Signed: 08/17/2017 10:29:14 AM By: Lawanda Cousins Entered By: Lawanda Cousins on 08/17/2017 10:29:14 [name] ([id number]) -------------------------------------------------------------------------------- Physician Orders Details Patient Name: [name] Date of Service: 08/17/2017 10:00 AM Medical Record Number: [id number] Patient Account Number: [id number] Date of Birth/Sex: January 31, 1946 (72 y.o. M) Treating RN: Montey Hora Primary Care Provider: Thomes Lolling Other Clinician: Referring Provider: Thomes Lolling Treating Provider/Extender: Cathie Olden in Treatment: 3 Verbal / Phone Orders: No Diagnosis Coding Wound Cleansing Wound #2 Left Abdomen - midline o Clean wound with Normal Saline. o May Shower, gently pat wound dry prior to applying new dressing. Skin Barriers/Peri-Wound Care Wound #2 Left Abdomen - midline o Skin Prep Primary Wound Dressing Wound #2 Left Abdomen - midline o Mepitel One Contact layer o Hydrafera Blue Ready Transfer Secondary Dressing Wound #2 Left Abdomen - midline o Other - telfa island Dressing Change Frequency Wound #2 Left Abdomen - midline   o Change dressing every other day. Follow-up Appointments Wound #2 Left Abdomen - midline o Return Appointment in 2 weeks. Electronic Signature(s) Signed: 08/17/2017 4:22:23 PM By: Montey Hora Signed: 08/17/2017 4:45:12 PM By: Lawanda Cousins Entered By: Montey Hora on 08/17/2017 10:25:18 [name] ([id number]) -------------------------------------------------------------------------------- Problem List Details Patient Name: [name], [name] Date of Service: 08/17/2017 10:00 AM Medical Record Number: [id number] Patient Account Number: [id number] Date of Birth/Sex: 1946-02-24 (72 y.o. M) Treating RN: Montey Hora Primary Care Provider: Thomes Lolling Other Clinician: Referring Provider: Thomes Lolling Treating Provider/Extender: Cathie Olden in Treatment: 58 Active Problems ICD-10 Impacting Encounter Code Description Active Date Wound Healing Diagnosis E11.622 Type 2 diabetes mellitus with other skin ulcer 07/03/2016 No Yes S31.102S Unspecified open wound of abdominal wall, epigastric region 07/03/2016 No Yes without penetration into peritoneal cavity, sequela F17.228 Nicotine dependence, chewing tobacco, with other nicotine- 07/03/2016 No Yes induced disorders Inactive Problems Resolved Problems ICD-10 Code Description Active Date Resolved Date S31.100A Unspecified open wound of abdominal wall, right upper quadrant 07/03/2016 07/03/2016 without penetration into peritoneal cavity, initial encounter Electronic Signature(s) Signed: 08/17/2017 10:28:33 AM By: Lawanda Cousins Entered By: Lawanda Cousins on 08/17/2017 10:28:33 [name] ([id number]) -------------------------------------------------------------------------------- Progress Note Details Patient Name: [name] Date of Service: 08/17/2017 10:00 AM Medical Record Number: [id number] Patient Account Number: [id number] Date of Birth/Sex: 1945-08-24 (72 y.o. M) Treating RN: Montey Hora Primary Care Provider: Thomes Lolling Other Clinician: Referring Provider: Thomes Lolling Treating Provider/Extender: Cathie Olden in Treatment: 9 Subjective Chief Complaint Information obtained from Patient anterior abdominal wall in the epigastric  region History of Present Illness (HPI) 72 year old gentleman has been seen recently by his PCP Dr. Thomes Lolling, who sees him with a history of diabetes mellitus, peripheral neuropathy, B12 deficiency, hypertension, osteoarthritis and the patient had recent blood sugar checked which was 163. Past medical history includes essential hypertension, incisional hernia, diabetes mellitus, colon polyps, COPD, tobacco abuse in the past, chewing tobacco, GERD. He has also been treated for chronic back pain with no sciatica and is on oxycodone. His last hemoglobin A1c was 6.9% His abdominal surgery was done at [facility] over 15 years ago and gradually there was sutures which protruded and then there was mesh which protruded. He washes this during his shower but does not put any dressing and the wound is covered with a lot of debris. He has never had a surgical consultation for this. 07/10/2016 -- the patient has spoken to his PCP who said he was going to call me but I have not yet had a phone conversation with the physician. From what I understand the physician was reluctant to have a surgical opinion because of the patient's several comorbidities and he feels that the surgery may be detrimental to his overall health 07/24/2016 -- the patient's daughter is at the bedside and we've had a detailed discussion regarding his options for a surgical opinion and possible surgical intervention before this becomes a emergent situation. She will seek primary care input and take the father to [facility] for a surgical opinion. 07/31/2016 -- the patient's daughter has organized for a surgical opinion at [facility] on June 14 08/14/2016 -- it has been about 3 weeks since he has stopped chewing tobacco. 08/28/2016 -- he was seen at [facility] by Dr. Roby Lofts -- the assessment was based on physical examination and review of his previous history of surgical repair. the patient had undergone a ventral hernia  repair in 1998 with  Dr. Quay Burow at [facility] at a prior open cholecystectomy incision. he was evaluated by Dr. Denice Paradise in 2013 and at that time it was decided to watch and wait. Dr. Payton Doughty recommended a repeat CT scan and requested records from dura and regional operative records and would follow-up the patient, after Mr. Dimattia PCP Dr. Gwenlyn Saran reviewed him for overall fitness for surgery. 09/04/16 patient's wound actually appear to be doing well on evaluation today. He is not really experience any discomfort and the right abdominal wound is dry and almost appears to be healing up. He continues to have some drainage from the left but this seems to be more of a potential seroma which with milking is able to be cleared out. I discussed with patient that when he performs his dressing changes at home it would be beneficial for him to do this as well. Otherwise there's no evidence of infection. He is still waiting on his CT scan and appointments to determine whether he is going to have another surgery. 09/11/2016 -- the patient has had a CT scan and is awaiting his surgical appointment prior to deciding on any surgical intervention. 09/25/16 patient presents today for fault evaluation concerning his ongoing abdominal surgical wounds. Unfortunately they do not appear to be improving significantly but rather fluctuate from a little better to little worse week by week. Obviously this is somewhat frustrating for him. 10/02/2016 -- the patient has completed his CT scan, and has a review by the surgeons at the end of August. He has a new area which has opened up a little below his left abdominal wound. [name], [name] ([id number]) 10/16/16 on evaluation today patient's wounds on the abdominal region appears to be doing about the same. He actually has an appointment with his surgeon on the 21st for evaluation regarding the required products appointment. Obviously he is having a difficult time with the feeling  of these wounds. Fortunately there does not appear to be any evidence of local infection such as significant amount of erythema or streaking. She also has an audio, vomiting, or diarrhea. He is not have any discomfort at this point. 10/30/2016 -- the surgical review is tomorrow and I am awaiting the input of the surgeon. 11/16/2016 -- I had a call from his surgeon Dr. Payton Doughty, on phone number [phone] who was kind enough to call me regarding his care. After a thorough review and having consulted the abdominal wall reconstruction team they have decided that surgery will be too extensive and rather moribund for this gentleman and have asked him to continue with local care at the wound center. She will send me an official note via Epic. 03/09/2017 -- the patient comes for some abdominal wall wounds which were caused by protruding mesh and complete surgical review was done at [facility] and no surgery has been recommended. He continues to get palliative care and the right abdominal wound is completely healed. He has a few open spots on the left which are being treated with palliative care and we see him once a month 05/04/17 on evaluation today Mr. Schappell presents for follow-up concerning his abdominal surgical ulcer. He does have some additional mashed taking out of the wound at this point although he has made a lot of progress since last time I saw him. There is good epithelialization in a lot of areas although there is still mesh poking through which is preventing complete closure. Fortunately there does not appear to be any evidence of infection. 05/18/17  on evaluation today patient appears to be doing very well in regard to his Adamo ulcer. Of the two areas that were previously open one has completely close in the proximal portion of the wound and the remainder of the opening which is the inferior portion is significantly smaller and there does not appear to be a lot of significant issue with  mesh poking through the skin currently. In fact this is a nice rather circular indention where he has granulation in the base. There was some of the mesh still showing through but very little. Overall he has made significant improvements in my opinion since even two weeks ago when I saw him. 06/22/17 on evaluation today patient's abdominal ulcer on inspection appears to be doing much better. He still has 2 tiny openings at either end of the surgical site where he has scar tissue noted at this point. Fortunately there does not appear to be any evidence of infection which is good news. He does still have some evidence of the mesh showing which still seems to be preventing this from fully closing but fortunately this seems to be minimal even compared to my last evaluation. Overall he has been doing very well. 07/06/17 on evaluation today patient's abdominal ulcer actually appears to be doing much better. He's showing signs of improvement little by little. Overall I feel this is appropriate as far as the progression that has been made. With that being said it's still been a very slow process. There's no evidence of infection. 07/20/17 on evaluation today patient appears to be doing rather well in regard to his left abdominal ulcer. He has been tolerating the dressing changes without complication fortunately there does not appear to be any evidence of infection. He still has two small openings although both of these appear to have filled in much more nicely compared to last week's evaluation. There is no obvious exposed mesh at this point. 08/03/17 on evaluation today the patient's wound on the abdominal region appears to be doing excellent. In fact the upper portion which was still open show signs of being completely epithelial eyes which is good news. He still does have a small opening on the inferior portion but overall things have progressed extremely well in my opinion. Overall I'm hoping that  she will see this completely close over the next couple of weeks. 08/17/17-He is here for preparation for an abdominal wound, there are 2 areas without complete epithelialization; the proximal area is fluid-filled and hyper granular, treated with silver nitrate, the distal aspect has a thin crusted layer of epithelium. There is no erythema to indicate infection. We will change treatment plan and he will follow-up next week Patient History Information obtained from Patient. Social History Former smoker - chew tobacco, Marital Status - Widowed, Alcohol Use - Never, Drug Use - No History, Caffeine Use - Daily. [name], [name] ([id number]) Medical And Surgical History Notes Oncologic 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 Objective Constitutional Vitals Time Taken: 10:15 AM, Height: 65 in, Weight: 161 lbs, BMI: 26.8, Temperature: 98.0 F, Pulse: 91 bpm, Respiratory Rate: 16 breaths/min, Blood Pressure: 121/62 mmHg. Integumentary (Hair, Skin) Wound #2 status is Open. Original cause of wound was Gradually Appeared. The wound is located on the Left Abdomen - midline. The wound measures 0.1cm length x 0.1cm width x 0.1cm depth; 0.008cm^2 area and 0.001cm^3 volume. The wound is limited to skin breakdown. There is no undermining noted. There is a none present amount of drainage noted.  The wound margin is flat and intact. There is no granulation within the wound bed. There is no necrotic tissue within the wound bed. The periwound skin appearance exhibited: Induration, Scarring. The periwound skin appearance did not exhibit: Callus, Crepitus, Excoriation, Rash, Dry/Scaly, Maceration, Atrophie Blanche, Cyanosis, Ecchymosis, Hemosiderin Staining, Mottled, Pallor, Rubor, Erythema. Periwound temperature was noted as No Abnormality. Assessment Active Problems ICD-10 Type 2 diabetes mellitus with other skin ulcer Unspecified open wound of abdominal wall, epigastric region without penetration into  peritoneal cavity, sequela Nicotine dependence, chewing tobacco, with other nicotine-induced disorders Procedures Wound #2 Pre-procedure diagnosis of Wound #2 is an Atypical located on the Left Abdomen - midline . An CHEM CAUT GRANULATION TISS procedure was performed by Lawanda Cousins, NP. Post procedure Diagnosis Wound #2: Same as Pre-Procedure Notes: silver nitrate stick used for chemical cauterization of hypergranular tissue [name], [name] ([id number]) Plan Wound Cleansing: Wound #2 Left Abdomen - midline: Clean wound with Normal Saline. May Shower, gently pat wound dry prior to applying new dressing. Skin Barriers/Peri-Wound Care: Wound #2 Left Abdomen - midline: Skin Prep Primary Wound Dressing: Wound #2 Left Abdomen - midline: Mepitel One Contact layer Hydrafera Blue Ready Transfer Secondary Dressing: Wound #2 Left Abdomen - midline: Other - telfa island Dressing Change Frequency: Wound #2 Left Abdomen - midline: Change dressing every other day. Follow-up Appointments: Wound #2 Left Abdomen - midline: Return Appointment in 2 weeks. Electronic Signature(s) Signed: 08/17/2017 10:32:24 AM By: Lawanda Cousins Entered By: Lawanda Cousins on 08/17/2017 10:32:24 [name] ([id number]) -------------------------------------------------------------------------------- ROS/PFSH Details Patient Name: [name] Date of Service: 08/17/2017 10:00 AM Medical Record Number: [id number] Patient Account Number: [id number] Date of Birth/Sex: 10-Sep-1945 (72 y.o. M) Treating RN: Montey Hora Primary Care Provider: Thomes Lolling Other Clinician: Referring Provider: Thomes Lolling Treating Provider/Extender: Cathie Olden in Treatment: 12 Information Obtained From Patient Wound History Do you currently have one or more open woundso Yes How many open wounds do you currently haveo 2 Approximately how long have you had your woundso more than 1 year How have you been treating your  wound(s) until nowo open to air Has your wound(s) ever healed and then re-openedo No Have you had any lab work done in the past montho No Have you tested positive for an antibiotic resistant organism (MRSA, VRE)o No Have you tested positive for osteomyelitis (bone infection)o No Have you had any tests for circulation on your legso No Eyes Medical History: Positive for: Cataracts - removed Hematologic/Lymphatic Medical History: Negative for: Anemia; Hemophilia; Human Immunodeficiency Virus; Lymphedema; Sickle Cell Disease Respiratory Medical History: Positive for: Chronic Obstructive Pulmonary Disease (COPD) Negative for: Aspiration; Asthma; Pneumothorax; Sleep Apnea; Tuberculosis Cardiovascular Medical History: Positive for: Hypertension Negative for: Angina; Arrhythmia; Congestive Heart Failure; Coronary Artery Disease; Deep Vein Thrombosis; Hypotension; Myocardial Infarction; Peripheral Arterial Disease; Peripheral Venous Disease; Phlebitis; Vasculitis Gastrointestinal Medical History: Negative for: Cirrhosis ; Colitis; Crohnos; Hepatitis A; Hepatitis B; Hepatitis C Endocrine Medical History: Positive for: Type II Diabetes Treated with: Oral agents Blood sugar tested every day: No [name], [name] ([id number]) Genitourinary Medical History: Negative for: End Stage Renal Disease Immunological Medical History: Negative for: Lupus Erythematosus; Raynaudos; Scleroderma Integumentary (Skin) Medical History: Negative for: History of Burn; History of pressure wounds Musculoskeletal Medical History: Positive for: Osteoarthritis Negative for: Gout; Rheumatoid Arthritis; Osteomyelitis Neurologic Medical History: Positive for: Neuropathy Negative for: Dementia; Quadriplegia; Paraplegia; Seizure Disorder Oncologic Medical History: Negative for: Received Chemotherapy; Received Radiation Past Medical History Notes: 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 Psychiatric Medical History: Negative for: Anorexia/bulimia; Confinement Anxiety HBO Extended History Items Eyes: Cataracts Immunizations Pneumococcal Vaccine: Received Pneumococcal Vaccination: Yes Immunization Notes: up to date Implantable Devices Family and Social History Former smoker - chew tobacco; Marital Status - Widowed; Alcohol Use: Never; Drug Use: No History; Caffeine Use: Daily; Financial Concerns: No; Food, Clothing or Shelter Needs: No; Support System Lacking: No; Transportation Concerns: No; Advanced Directives: No; Patient does not want information on Advanced Directives Physician Affirmation I have reviewed and agree with the above information. [name], [name] ([id number]) Electronic Signature(s) Signed: 08/17/2017 4:22:23 PM By: Montey Hora Signed: 08/17/2017 4:45:12 PM By: Lawanda Cousins Entered By: Lawanda Cousins on 08/17/2017 10:31:08 [name], [name] ([id number]) -------------------------------------------------------------------------------- [place] Details Patient Name: [name], [name] Date of Service: 08/17/2017 Medical Record Number: [id number] Patient Account Number: [id number] Date of Birth/Sex: 09/17/1945 (72 y.o. M) Treating RN: Montey Hora Primary Care Provider: Thomes Lolling Other Clinician: Referring Provider: Thomes Lolling Treating Provider/Extender: Cathie Olden in Treatment: 33 Diagnosis Coding ICD-10 Codes Code Description E11.622 Type 2 diabetes mellitus with other skin ulcer Unspecified open wound of abdominal wall, epigastric region without penetration into peritoneal cavity, S31.102S sequela F17.228 Nicotine dependence, chewing tobacco, with other nicotine-induced disorders Facility Procedures CPT4: Description Modifier Quantity Code 64680321 17250 - CHEM CAUT GRANULATION TISS 1 ICD-10 Diagnosis Description S31.102S Unspecified open wound of abdominal wall, epigastric region without penetration into peritoneal cavity,  sequela Physician Procedures CPT4: Description Modifier Quantity Code 2248250 03704 - WC PHYS CHEM CAUT GRAN TISSUE 1 ICD-10 Diagnosis Description S31.102S Unspecified open wound of abdominal wall, epigastric region without penetration into peritoneal cavity, sequela Electronic Signature(s) Signed: 08/17/2017 10:32:38 AM By: Lawanda Cousins Entered By: Lawanda Cousins on 08/17/2017 10:32:37

## 2017-08-23 NOTE — Progress Notes (Signed)
[name], [name] ([id number]) Visit Report for 08/17/2017 Arrival Information Details Patient Name: [name], [name]. Date of Service: 08/17/2017 10:00 AM Medical Record Number: [id number] Patient Account Number: [id number] Date of Birth/Sex: 08-Mar-1946 (72 y.o. M) Treating RN: Cornell Barman Primary Care Hill Mackie: Thomes Lolling Other Clinician: Referring Mckay Brandt: Thomes Lolling Treating Kainalu Heggs/Extender: Cathie Olden in Treatment: 56 Visit Information History Since Last Visit Added or deleted any medications: No Patient Arrived: Ambulatory Any new allergies or adverse reactions: No Arrival Time: 10:13 Had a fall or experienced change in No Accompanied By: self activities of daily living that may affect Transfer Assistance: None risk of falls: Patient Identification Verified: Yes Signs or symptoms of abuse/neglect since last visito No Secondary Verification Process Completed: Yes Hospitalized since last visit: No Patient Requires Transmission-Based No Implantable device outside of the clinic excluding No Precautions: cellular tissue based products placed in the center Patient Has Alerts: Yes since last visit: Has Dressing in Place as Prescribed: Yes Pain Present Now: No Electronic Signature(s) Signed: 08/21/2017 3:21:59 PM By: Gretta Cool, BSN, RN, CWS, Kim RN, BSN Entered By: Gretta Cool, BSN, RN, CWS, Kim on 08/17/2017 10:14:23 [name] ([id number]) -------------------------------------------------------------------------------- Encounter Discharge Information Details Patient Name: [name], [name]. Date of Service: 08/17/2017 10:00 AM Medical Record Number: [id number] Patient Account Number: [id number] Date of Birth/Sex: 15-Jan-1946 (72 y.o. M) Treating RN: Ahmed Prima Primary Care Zamariya Neal: Thomes Lolling Other Clinician: Referring Reynol Arnone: Thomes Lolling Treating Serina Nichter/Extender: Cathie Olden in Treatment: 99 Encounter Discharge Information Items Discharge Condition:  Stable Ambulatory Status: Ambulatory Discharge Destination: Home Transportation: Private Auto Accompanied By: self Schedule Follow-up Appointment: Yes Clinical Summary of Care: Electronic Signature(s) Signed: 08/17/2017 11:49:02 AM By: Alric Quan Entered By: Alric Quan on 08/17/2017 10:35:58 [name] ([id number]) -------------------------------------------------------------------------------- Lower Extremity Assessment Details Patient Name: [name] Date of Service: 08/17/2017 10:00 AM Medical Record Number: [id number] Patient Account Number: [id number] Date of Birth/Sex: [birth year] (72 y.o. M) Treating RN: Cornell Barman Primary Care Danisha Brassfield: Thomes Lolling Other Clinician: Referring Missael Ferrari: Thomes Lolling Treating Thaddeus Evitts/Extender: Cathie Olden in Treatment: 40 Electronic Signature(s) Signed: 08/21/2017 3:21:59 PM By: Gretta Cool, BSN, RN, CWS, Kim RN, BSN Entered By: Gretta Cool, BSN, RN, CWS, Kim on 08/17/2017 10:20:25 [name] ([id number]) -------------------------------------------------------------------------------- Multi Wound Chart Details Patient Name: [name], [name]. Date of Service: 08/17/2017 10:00 AM Medical Record Number: [id number] Patient Account Number: [id number] Date of Birth/Sex: [birth year] (71 y.o. M) Treating RN: Montey Hora Primary Care Jonah Gingras: Thomes Lolling Other Clinician: Referring Arlee Santosuosso: Thomes Lolling Treating Dareen Gutzwiller/Extender: Cathie Olden in Treatment: 72 Vital Signs Height(in): 65 Pulse(bpm): 60 Weight(lbs): 161 Blood Pressure(mmHg): 121/62 Body Mass Index(BMI): 27 Temperature(F): 98.0 Respiratory Rate 16 (breaths/min): Photos: [2:No Photos] [N/A:N/A] Wound Location: [2:Left Abdomen - midline] [N/A:N/A] Wounding Event: [2:Gradually Appeared] [N/A:N/A] Primary Etiology: [2:Atypical] [N/A:N/A] Comorbid History: [2:Cataracts, Chronic Obstructive N/A Pulmonary Disease (COPD), Hypertension, Type II  Diabetes, Osteoarthritis, Neuropathy] Date Acquired: [2:05/17/2015] [N/A:N/A] Weeks of Treatment: [2:58] [N/A:N/A] Wound Status: [2:Open] [N/A:N/A] Clustered Wound: [2:Yes] [N/A:N/A] Clustered Quantity: [2:1] [N/A:N/A] Measurements L x W x D [2:0.1x0.1x0.1] [N/A:N/A] (cm) Area (cm) : [2:0.008] [N/A:N/A] Volume (cm) : [2:0.001] [N/A:N/A] % Reduction in Area: [2:99.80%] [N/A:N/A] % Reduction in Volume: [2:99.80%] [N/A:N/A] Classification: [2:Full Thickness Without Exposed Support Structures] [N/A:N/A] Exudate Amount: [2:None Present] [N/A:N/A] Wound Margin: [2:Flat and Intact] [N/A:N/A] Granulation Amount: [2:None Present (0%)] [N/A:N/A] Necrotic Amount: [2:None Present (0%)] [N/A:N/A] Exposed Structures: [2:Fascia: No Fat Layer (Subcutaneous Tissue) Exposed: No Tendon: No Muscle: No Joint: No Bone: No Limited to Skin Breakdown] [N/A:N/A]  Epithelialization: [2:Large (67-100%)] [N/A:N/A] Periwound Skin Texture: [2:Induration: Yes Scarring: Yes] [N/A:N/A] Excoriation: No Callus: No Crepitus: No Rash: No Periwound Skin Moisture: Maceration: No N/A N/A Dry/Scaly: No Periwound Skin Color: Atrophie Blanche: No N/A N/A Cyanosis: No Ecchymosis: No Erythema: No Hemosiderin Staining: No Mottled: No Pallor: No Rubor: No Temperature: No Abnormality N/A N/A Tenderness on Palpation: No N/A N/A Wound Preparation: Ulcer Cleansing: N/A N/A Rinsed/Irrigated with Saline Topical Anesthetic Applied: None Procedures Performed: CHEM CAUT GRANULATION N/A N/A TISS Treatment Notes Electronic Signature(s) Signed: 08/17/2017 10:28:41 AM By: Lawanda Cousins Entered By: Lawanda Cousins on 08/17/2017 10:28:40 [name] ([id number]) -------------------------------------------------------------------------------- [place] Details Patient Name: [name] Date of Service: 08/17/2017 10:00 AM Medical Record Number: [id number] Patient Account Number: [id number] Date of  Birth/Sex: May 14, 1945 (72 y.o. M) Treating RN: Montey Hora Primary Care Sheran Newstrom: Thomes Lolling Other Clinician: Referring Venus Ruhe: Thomes Lolling Treating Chari Parmenter/Extender: Cathie Olden in Treatment: 15 Active Inactive ` Abuse / Safety / Falls / Self Care Management Nursing Diagnoses: Impaired physical mobility Goals: Patient will remain injury free Date Initiated: 07/03/2016 Target Resolution Date: 09/14/2016 Goal Status: Active Interventions: Assess fall risk on admission and as needed Notes: ` Orientation to the Wound Care Program Nursing Diagnoses: Knowledge deficit related to the wound healing center program Goals: Patient/caregiver will verbalize understanding of the [place] Date Initiated: 07/03/2016 Target Resolution Date: 09/15/2016 Goal Status: Active Interventions: Provide education on orientation to the wound center Notes: ` Wound/Skin Impairment Nursing Diagnoses: Impaired tissue integrity Goals: Patient/caregiver will verbalize understanding of skin care regimen Date Initiated: 07/03/2016 Target Resolution Date: 09/15/2016 Goal Status: Active Ulcer/skin breakdown will have a volume reduction of 30% by week 4 Date Initiated: 07/03/2016 Target Resolution Date: 09/15/2016 [name], [name] ([id number]) Goal Status: Active Ulcer/skin breakdown will have a volume reduction of 50% by week 8 Date Initiated: 07/03/2016 Target Resolution Date: 09/15/2016 Goal Status: Active Ulcer/skin breakdown will have a volume reduction of 80% by week 12 Date Initiated: 07/03/2016 Target Resolution Date: 09/15/2016 Goal Status: Active Ulcer/skin breakdown will heal within 14 weeks Date Initiated: 07/03/2016 Target Resolution Date: 09/15/2016 Goal Status: Active Interventions: Assess patient/caregiver ability to obtain necessary supplies Assess patient/caregiver ability to perform ulcer/skin care regimen upon admission and as needed Assess ulceration(s)  every visit Notes: Electronic Signature(s) Signed: 08/17/2017 4:22:23 PM By: Montey Hora Entered By: Montey Hora on 08/17/2017 10:23:01 [name] ([id number]) -------------------------------------------------------------------------------- Pain Assessment Details Patient Name: [name] Date of Service: 08/17/2017 10:00 AM Medical Record Number: [id number] Patient Account Number: [id number] Date of Birth/Sex: 07/01/1945 (72 y.o. M) Treating RN: Cornell Barman Primary Care Joslin Doell: Thomes Lolling Other Clinician: Referring Dannika Hilgeman: Thomes Lolling Treating Oree Mirelez/Extender: Cathie Olden in Treatment: 51 Active Problems Location of Pain Severity and Description of Pain Patient Has Paino No Site Locations Pain Management and Medication Current Pain Management: Goals for Pain Management Topical or injectable lidocaine is offered to patient for acute pain when surgical debridement is performed. If needed, Patient is instructed to use over the counter pain medication for the following 24-48 hours after debridement. Wound care MDs do not prescribed pain medications. Patient has chronic pain or uncontrolled pain. Patient has been instructed to make an appointment with their Primary Care Physician for pain management. Electronic Signature(s) Signed: 08/21/2017 3:21:59 PM By: Gretta Cool, BSN, RN, CWS, Kim RN, BSN Entered By: Gretta Cool, BSN, RN, CWS, Kim on 08/17/2017 10:15:39 [name] ([id number]) -------------------------------------------------------------------------------- Patient/Caregiver Education Details Patient Name: [name], [name] Date of Service:  08/17/2017 10:00 AM Medical Record Number: [id number] Patient Account Number: [id number] Date of Birth/Gender: 08/05/1945 (71 y.o. M) Treating RN: Ahmed Prima Primary Care Physician: Thomes Lolling Other Clinician: Referring Physician: Thomes Lolling Treating Physician/Extender: Cathie Olden in Treatment:  82 Education Assessment Education Provided To: Patient Education Topics Provided Wound/Skin Impairment: Handouts: Caring for Your Ulcer, Skin Care Do's and Dont's, Other: change dressing as ordered Methods: Demonstration, Explain/Verbal Responses: State content correctly Electronic Signature(s) Signed: 08/17/2017 11:49:02 AM By: Alric Quan Entered By: Alric Quan on 08/17/2017 10:36:14 [name] ([id number]) -------------------------------------------------------------------------------- Wound Assessment Details Patient Name: [name] Date of Service: 08/17/2017 10:00 AM Medical Record Number: [id number] Patient Account Number: [id number] Date of Birth/Sex: 1945/06/14 (72 y.o. M) Treating RN: Cornell Barman Primary Care Labron Bloodgood: Thomes Lolling Other Clinician: Referring Pierre Dellarocco: Thomes Lolling Treating Britini Garcilazo/Extender: Cathie Olden in Treatment: 21 Wound Status Wound Number: 2 Primary Atypical Etiology: Wound Location: Left Abdomen - midline Wound Open Wounding Event: Gradually Appeared Status: Date Acquired: 05/17/2015 Comorbid Cataracts, Chronic Obstructive Pulmonary Weeks Of Treatment: 58 History: Disease (COPD), Hypertension, Type II Clustered Wound: Yes Diabetes, Osteoarthritis, Neuropathy Photos Photo Uploaded By: Gretta Cool, BSN, RN, CWS, Kim on 08/17/2017 15:09:54 Wound Measurements Length: (cm) 0.1 Width: (cm) 0.1 Depth: (cm) 0.1 Clustered Quantity: 1 Area: (cm) 0.008 Volume: (cm) 0.001 % Reduction in Area: 99.8% % Reduction in Volume: 99.8% Epithelialization: Large (67-100%) Undermining: No Wound Description Full Thickness Without Exposed Support Foul Odor Classification: Structures Slough/Fi Wound Margin: Flat and Intact Exudate None Present Amount: After Cleansing: No brino No Wound Bed Granulation Amount: None Present (0%) Exposed Structure Necrotic Amount: None Present (0%) Fascia Exposed: No Fat Layer (Subcutaneous  Tissue) Exposed: No Tendon Exposed: No Muscle Exposed: No Joint Exposed: No Bone Exposed: No Limited to Skin Breakdown [name], [name] ([id number]) Periwound Skin Texture Texture Color No Abnormalities Noted: No No Abnormalities Noted: No Callus: No Atrophie Blanche: No Crepitus: No Cyanosis: No Excoriation: No Ecchymosis: No Induration: Yes Erythema: No Rash: No Hemosiderin Staining: No Scarring: Yes Mottled: No Pallor: No Moisture Rubor: No No Abnormalities Noted: No Dry / Scaly: No Temperature / Pain Maceration: No Temperature: No Abnormality Wound Preparation Ulcer Cleansing: Rinsed/Irrigated with Saline Topical Anesthetic Applied: None Treatment Notes Wound #2 (Left Abdomen - midline) 1. Cleansed with: Clean wound with Normal Saline 2. Anesthetic Topical Lidocaine 4% cream to wound bed prior to debridement 4. Dressing Applied: Hydrafera Blue 5. Secondary [place] Notes mepitel Electronic Signature(s) Signed: 08/21/2017 3:21:59 PM By: Gretta Cool, BSN, RN, CWS, Kim RN, BSN Entered By: Gretta Cool, BSN, RN, CWS, Kim on 08/17/2017 10:20:12 [name] ([id number]) -------------------------------------------------------------------------------- [place] Details Patient Name: [name], [name] Date of Service: 08/17/2017 10:00 AM Medical Record Number: [id number] Patient Account Number: [id number] Date of Birth/Sex: February 28, 1946 (73 y.o. M) Treating RN: Cornell Barman Primary Care Madellyn Denio: Thomes Lolling Other Clinician: Referring Martia Dalby: Thomes Lolling Treating Evelene Roussin/Extender: Cathie Olden in Treatment: 28 Vital Signs Time Taken: 10:15 Temperature (F): 98.0 Height (in): 65 Pulse (bpm): 91 Weight (lbs): 161 Respiratory Rate (breaths/min): 16 Body Mass Index (BMI): 26.8 Blood Pressure (mmHg): 121/62 Reference Range: 80 - 120 mg / dl Electronic Signature(s) Signed: 08/21/2017 3:21:59 PM By: Gretta Cool, BSN, RN, CWS, Kim RN, BSN Entered By:  Gretta Cool, BSN, RN, CWS, Kim on 08/17/2017 10:16:00

## 2017-08-24 ENCOUNTER — Encounter: Payer: Medicare HMO | Admitting: Physician Assistant

## 2017-08-24 DIAGNOSIS — E11622 Type 2 diabetes mellitus with other skin ulcer: Secondary | ICD-10-CM | POA: Diagnosis not present

## 2017-08-27 MED ORDER — OXYCODONE-ACETAMINOPHEN 5 MG-325 MG TABLET
ORAL_TABLET | ORAL | 0 refills | 0 days | Status: CP | PRN
Start: 2017-08-27 — End: 2017-09-26

## 2017-08-28 NOTE — Progress Notes (Signed)
Michael, Jackson ([id number]) Visit Report for 08/24/2017 Chief Complaint Document Details Patient Name: [name], [name]. Date of Service: 08/24/2017 10:30 AM Medical Record Number: [id number] Patient Account Number: [id number] Date of Birth/Sex: 1945-11-17 (72 y.o. M) Treating RN: Primary Care Provider: Thomes Lolling Other Clinician: Referring Provider: Thomes Lolling Treating Provider/Extender: Melburn Hake, HOYT Weeks in Treatment: 70 Information Obtained from: Patient Chief Complaint anterior abdominal wall in the epigastric region Electronic Signature(s) Signed: 08/26/2017 11:18:14 PM By: Worthy Keeler PA-C Entered By: Worthy Keeler on 08/24/2017 10:23:53 [name] ([id number]) -------------------------------------------------------------------------------- HPI Details Patient Name: [name] Date of Service: 08/24/2017 10:30 AM Medical Record Number: [id number] Patient Account Number: [id number] Date of Birth/Sex: 03-05-1946 (72 y.o. M) Treating RN: Primary Care Provider: Thomes Lolling Other Clinician: Referring Provider: Thomes Lolling Treating Provider/Extender: Melburn Hake, HOYT Weeks in Treatment: 73 History of Present Illness HPI Description: 72 year old gentleman has been seen recently by his PCP Dr. Thomes Lolling, who sees him with a history of diabetes mellitus, peripheral neuropathy, B12 deficiency, hypertension, osteoarthritis and the patient had recent blood sugar checked which was 163. Past medical history includes essential hypertension, incisional hernia, diabetes mellitus, colon polyps, COPD, tobacco abuse in the past, chewing tobacco, GERD. He has also been treated for chronic back pain with no sciatica and is on oxycodone. His last hemoglobin A1c was 6.9% His abdominal surgery was done at [facility] over 15 years ago and gradually there was sutures which protruded and then there was mesh which protruded. He washes this during his shower but does not put any  dressing and the wound is covered with a lot of debris. He has never had a surgical consultation for this. 07/10/2016 -- the patient has spoken to his PCP who said he was going to call me but I have not yet had a phone conversation with the physician. From what I understand the physician was reluctant to have a surgical opinion because of the patient's several comorbidities and he feels that the surgery may be detrimental to his overall health 07/24/2016 -- the patient's daughter is at the bedside and we've had a detailed discussion regarding his options for a surgical opinion and possible surgical intervention before this becomes a emergent situation. She will seek primary care input and take the father to [facility] for a surgical opinion. 07/31/2016 -- the patient's daughter has organized for a surgical opinion at [facility] on June 14 08/14/2016 -- it has been about 3 weeks since he has stopped chewing tobacco. 08/28/2016 -- he was seen at [facility] by Dr. Roby Lofts -- the assessment was based on physical examination and review of his previous history of surgical repair. the patient had undergone a ventral hernia repair in 1998 with Dr. Quay Burow at [facility] at a prior open cholecystectomy incision. he was evaluated by Dr. Denice Paradise in 2013 and at that time it was decided to watch and wait. Dr. Payton Doughty recommended a repeat CT scan and requested records from dura and regional operative records and would follow-up the patient, after Mr. Waterhouse PCP Dr. Gwenlyn Saran reviewed him for overall fitness for surgery. 09/04/16 patient's wound actually appear to be doing well on evaluation today. He is not really experience any discomfort and the right abdominal wound is dry and almost appears to be healing up. He continues to have some drainage from the left but this seems to be more of a potential seroma which with milking is able to be cleared out. I  discussed with patient that when he performs his  dressing changes at home it would be beneficial for him to do this as well. Otherwise there's no evidence of infection. He is still waiting on his CT scan and appointments to determine whether he is going to have another surgery. 09/11/2016 -- the patient has had a CT scan and is awaiting his surgical appointment prior to deciding on any surgical intervention. 09/25/16 patient presents today for fault evaluation concerning his ongoing abdominal surgical wounds. Unfortunately they do not appear to be improving significantly but rather fluctuate from a little better to little worse week by week. Obviously this is somewhat frustrating for him. 10/02/2016 -- the patient has completed his CT scan, and has a review by the surgeons at the end of August. He has a new area which has opened up a little below his left abdominal wound. 10/16/16 on evaluation today patient's wounds on the abdominal region appears to be doing about the same. He actually has an appointment with his surgeon on the 21st for evaluation regarding the required products appointment. Obviously he is having a difficult time with the feeling of these wounds. Fortunately there does not appear to be any evidence of local infection such as significant amount of erythema or streaking. She also has an audio, vomiting, or diarrhea. He is not have any discomfort at this point. [name], [name] ([id number]) 10/30/2016 -- the surgical review is tomorrow and I am awaiting the input of the surgeon. 11/16/2016 -- I had a call from his surgeon Dr. Payton Doughty, on phone number [phone] who was kind enough to call me regarding his care. After a thorough review and having consulted the abdominal wall reconstruction team they have decided that surgery will be too extensive and rather moribund for this gentleman and have asked him to continue with local care at the wound center. She will send me an official note via Epic. 03/09/2017 -- the patient comes for  some abdominal wall wounds which were caused by protruding mesh and complete surgical review was done at [facility] and no surgery has been recommended. He continues to get palliative care and the right abdominal wound is completely healed. He has a few open spots on the left which are being treated with palliative care and we see him once a month 05/04/17 on evaluation today Mr. Baize presents for follow-up concerning his abdominal surgical ulcer. He does have some additional mashed taking out of the wound at this point although he has made a lot of progress since last time I saw him. There is good epithelialization in a lot of areas although there is still mesh poking through which is preventing complete closure. Fortunately there does not appear to be any evidence of infection. 05/18/17 on evaluation today patient appears to be doing very well in regard to his Adamo ulcer. Of the two areas that were previously open one has completely close in the proximal portion of the wound and the remainder of the opening which is the inferior portion is significantly smaller and there does not appear to be a lot of significant issue with mesh poking through the skin currently. In fact this is a nice rather circular indention where he has granulation in the base. There was some of the mesh still showing through but very little. Overall he has made significant improvements in my opinion since even two weeks ago when I saw him. 06/22/17 on evaluation today patient's abdominal ulcer on inspection appears to  be doing much better. He still has 2 tiny openings at either end of the surgical site where he has scar tissue noted at this point. Fortunately there does not appear to be any evidence of infection which is good news. He does still have some evidence of the mesh showing which still seems to be preventing this from fully closing but fortunately this seems to be minimal even compared to my last evaluation.  Overall he has been doing very well. 07/06/17 on evaluation today patient's abdominal ulcer actually appears to be doing much better. He's showing signs of improvement little by little. Overall I feel this is appropriate as far as the progression that has been made. With that being said it's still been a very slow process. There's no evidence of infection. 07/20/17 on evaluation today patient appears to be doing rather well in regard to his left abdominal ulcer. He has been tolerating the dressing changes without complication fortunately there does not appear to be any evidence of infection. He still has two small openings although both of these appear to have filled in much more nicely compared to last week's evaluation. There is no obvious exposed mesh at this point. 08/03/17 on evaluation today the patient's wound on the abdominal region appears to be doing excellent. In fact the upper portion which was still open show signs of being completely epithelial eyes which is good news. He still does have a small opening on the inferior portion but overall things have progressed extremely well in my opinion. Overall I'm hoping that she will see this completely close over the next couple of weeks. 08/17/17-He is here for preparation for an abdominal wound, there are 2 areas without complete epithelialization; the proximal area is fluid-filled and hyper granular, treated with silver nitrate, the distal aspect has a thin crusted layer of epithelium. There is no erythema to indicate infection. We will change treatment plan and he will follow-up next week 08/24/17 on evaluation today patient actually appears to be doing very well in regard to his abdominal ulcer. Unfortunately the little area that had closed previous when I saw him actually reopened when Denny Peon saw him last week. Subsequently the lower portion seems to have almost completely resolved which is good news in fact it may be closed even at this point.  Nonetheless overall I'm very pleased with the progress that has been made up to this point. Electronic Signature(s) Signed: 08/26/2017 11:18:14 PM By: Worthy Keeler PA-C Entered By: Worthy Keeler on 08/24/2017 10:48:54 [name] ([id number]) -------------------------------------------------------------------------------- Physical Exam Details Patient Name: [name], [name] Date of Service: 08/24/2017 10:30 AM Medical Record Number: [id number] Patient Account Number: [id number] Date of Birth/Sex: 01-17-1946 (72 y.o. M) Treating RN: Primary Care Provider: Thomes Lolling Other Clinician: Referring Provider: Thomes Lolling Treating Provider/Extender: Melburn Hake, HOYT Weeks in Treatment: 7 Constitutional Well-nourished and well-hydrated in no acute distress. Respiratory normal breathing without difficulty. clear to auscultation bilaterally. Cardiovascular regular rate and rhythm with normal S1, S2. Psychiatric this patient is able to make decisions and demonstrates good insight into disease process. Alert and Oriented x 3. pleasant and cooperative. Notes Patient's wound did not require any debridement today there is no evidence of any of the mesh sticking out of the wound bed which is also good news. Overall I'm very happy with the in general appearance of the wound there is a little streak extending from the upper portion opening that I would like to keep an eye on at this  point this was discussed with the patient as well today. For that reason I'll likely see him in one weeks time for reevaluation just to ensure everything is all right. Electronic Signature(s) Signed: 08/26/2017 11:18:14 PM By: Worthy Keeler PA-C Entered By: Worthy Keeler on 08/24/2017 10:49:28 [name] ([id number]) -------------------------------------------------------------------------------- Physician Orders Details Patient Name: [name], [name] Date of Service: 08/24/2017 10:30 AM Medical Record  Number: [id number] Patient Account Number: [id number] Date of Birth/Sex: 1945/11/15 (72 y.o. M) Treating RN: Montey Hora Primary Care Provider: Thomes Lolling Other Clinician: Referring Provider: Thomes Lolling Treating Provider/Extender: Sharalyn Ink in Treatment: 57 Verbal / Phone Orders: No Diagnosis Coding ICD-10 Coding Code Description E11.622 Type 2 diabetes mellitus with other skin ulcer Unspecified open wound of abdominal wall, epigastric region without penetration into peritoneal cavity, S31.102S sequela F17.228 Nicotine dependence, chewing tobacco, with other nicotine-induced disorders Wound Cleansing Wound #2 Left Abdomen - midline o Clean wound with Normal Saline. o May Shower, gently pat wound dry prior to applying new dressing. Skin Barriers/Peri-Wound Care Wound #2 Left Abdomen - midline o Skin Prep Primary Wound Dressing Wound #2 Left Abdomen - midline o Mepitel One Contact layer o Hydrafera Blue Ready Transfer Secondary Dressing Wound #2 Left Abdomen - midline o Other - telfa island Dressing Change Frequency Wound #2 Left Abdomen - midline o Change dressing every other day. Follow-up Appointments Wound #2 Left Abdomen - midline o Return Appointment in 2 weeks. Electronic Signature(s) Signed: 08/24/2017 4:47:18 PM By: Montey Hora Signed: 08/26/2017 11:18:14 PM By: Worthy Keeler PA-C Entered By: Montey Hora on 08/24/2017 10:46:13 [name] ([id number]) -------------------------------------------------------------------------------- Problem List Details Patient Name: [name], [name] Date of Service: 08/24/2017 10:30 AM Medical Record Number: [id number] Patient Account Number: [id number] Date of Birth/Sex: 1946-02-02 (72 y.o. M) Treating RN: Primary Care Provider: Thomes Lolling Other Clinician: Referring Provider: Thomes Lolling Treating Provider/Extender: Sharalyn Ink in Treatment: 54 Active  Problems ICD-10 Impacting Encounter Code Description Active Date Wound Healing Diagnosis E11.622 Type 2 diabetes mellitus with other skin ulcer 07/03/2016 No Yes S31.102S Unspecified open wound of abdominal wall, epigastric region 07/03/2016 No Yes without penetration into peritoneal cavity, sequela F17.228 Nicotine dependence, chewing tobacco, with other nicotine- 07/03/2016 No Yes induced disorders Inactive Problems Resolved Problems ICD-10 Code Description Active Date Resolved Date S31.100A Unspecified open wound of abdominal wall, right upper quadrant 07/03/2016 07/03/2016 without penetration into peritoneal cavity, initial encounter Electronic Signature(s) Signed: 08/26/2017 11:18:14 PM By: Worthy Keeler PA-C Entered By: Worthy Keeler on 08/24/2017 10:23:45 [name] ([id number]) -------------------------------------------------------------------------------- Progress Note Details Patient Name: [name] Date of Service: 08/24/2017 10:30 AM Medical Record Number: [id number] Patient Account Number: [id number] Date of Birth/Sex: [birth year] (72 y.o. M) Treating RN: Primary Care Provider: Thomes Lolling Other Clinician: Referring Provider: Thomes Lolling Treating Provider/Extender: Melburn Hake, HOYT Weeks in Treatment: 53 Subjective Chief Complaint Information obtained from Patient anterior abdominal wall in the epigastric region History of Present Illness (HPI) 72 year old gentleman has been seen recently by his PCP Dr. Thomes Lolling, who sees him with a history of diabetes mellitus, peripheral neuropathy, B12 deficiency, hypertension, osteoarthritis and the patient had recent blood sugar checked which was 163. Past medical history includes essential hypertension, incisional hernia, diabetes mellitus, colon polyps, COPD, tobacco abuse in the past, chewing tobacco, GERD. He has also been treated for chronic back pain with no sciatica and is on oxycodone. His last hemoglobin  A1c was 6.9% His abdominal surgery was done at  [facility] over 15 years ago and gradually there was sutures which protruded and then there was mesh which protruded. He washes this during his shower but does not put any dressing and the wound is covered with a lot of debris. He has never had a surgical consultation for this. 07/10/2016 -- the patient has spoken to his PCP who said he was going to call me but I have not yet had a phone conversation with the physician. From what I understand the physician was reluctant to have a surgical opinion because of the patient's several comorbidities and he feels that the surgery may be detrimental to his overall health 07/24/2016 -- the patient's daughter is at the bedside and we've had a detailed discussion regarding his options for a surgical opinion and possible surgical intervention before this becomes a emergent situation. She will seek primary care input and take the father to [facility] for a surgical opinion. 07/31/2016 -- the patient's daughter has organized for a surgical opinion at [facility] on June 14 08/14/2016 -- it has been about 3 weeks since he has stopped chewing tobacco. 08/28/2016 -- he was seen at [facility] by Dr. Roby Lofts -- the assessment was based on physical examination and review of his previous history of surgical repair. the patient had undergone a ventral hernia repair in 1998 with Dr. Quay Burow at [facility] at a prior open cholecystectomy incision. he was evaluated by Dr. Denice Paradise in 2013 and at that time it was decided to watch and wait. Dr. Payton Doughty recommended a repeat CT scan and requested records from dura and regional operative records and would follow-up the patient, after Mr. Salceda PCP Dr. Gwenlyn Saran reviewed him for overall fitness for surgery. 09/04/16 patient's wound actually appear to be doing well on evaluation today. He is not really experience any discomfort and the right abdominal wound is dry and almost  appears to be healing up. He continues to have some drainage from the left but this seems to be more of a potential seroma which with milking is able to be cleared out. I discussed with patient that when he performs his dressing changes at home it would be beneficial for him to do this as well. Otherwise there's no evidence of infection. He is still waiting on his CT scan and appointments to determine whether he is going to have another surgery. 09/11/2016 -- the patient has had a CT scan and is awaiting his surgical appointment prior to deciding on any surgical intervention. 09/25/16 patient presents today for fault evaluation concerning his ongoing abdominal surgical wounds. Unfortunately they do not appear to be improving significantly but rather fluctuate from a little better to little worse week by week. Obviously this is somewhat frustrating for him. 10/02/2016 -- the patient has completed his CT scan, and has a review by the surgeons at the end of August. He has a new area which has opened up a little below his left abdominal wound. [name], [name] ([id number]) 10/16/16 on evaluation today patient's wounds on the abdominal region appears to be doing about the same. He actually has an appointment with his surgeon on the 21st for evaluation regarding the required products appointment. Obviously he is having a difficult time with the feeling of these wounds. Fortunately there does not appear to be any evidence of local infection such as significant amount of erythema or streaking. She also has an audio, vomiting, or diarrhea. He is not have any discomfort at this point. 10/30/2016 --  the surgical review is tomorrow and I am awaiting the input of the surgeon. 11/16/2016 -- I had a call from his surgeon Dr. Payton Doughty, on phone number [phone] who was kind enough to call me regarding his care. After a thorough review and having consulted the abdominal wall reconstruction team they have  decided that surgery will be too extensive and rather moribund for this gentleman and have asked him to continue with local care at the wound center. She will send me an official note via Epic. 03/09/2017 -- the patient comes for some abdominal wall wounds which were caused by protruding mesh and complete surgical review was done at [facility] and no surgery has been recommended. He continues to get palliative care and the right abdominal wound is completely healed. He has a few open spots on the left which are being treated with palliative care and we see him once a month 05/04/17 on evaluation today Mr. Supple presents for follow-up concerning his abdominal surgical ulcer. He does have some additional mashed taking out of the wound at this point although he has made a lot of progress since last time I saw him. There is good epithelialization in a lot of areas although there is still mesh poking through which is preventing complete closure. Fortunately there does not appear to be any evidence of infection. 05/18/17 on evaluation today patient appears to be doing very well in regard to his Adamo ulcer. Of the two areas that were previously open one has completely close in the proximal portion of the wound and the remainder of the opening which is the inferior portion is significantly smaller and there does not appear to be a lot of significant issue with mesh poking through the skin currently. In fact this is a nice rather circular indention where he has granulation in the base. There was some of the mesh still showing through but very little. Overall he has made significant improvements in my opinion since even two weeks ago when I saw him. 06/22/17 on evaluation today patient's abdominal ulcer on inspection appears to be doing much better. He still has 2 tiny openings at either end of the surgical site where he has scar tissue noted at this point. Fortunately there does not appear to be any  evidence of infection which is good news. He does still have some evidence of the mesh showing which still seems to be preventing this from fully closing but fortunately this seems to be minimal even compared to my last evaluation. Overall he has been doing very well. 07/06/17 on evaluation today patient's abdominal ulcer actually appears to be doing much better. He's showing signs of improvement little by little. Overall I feel this is appropriate as far as the progression that has been made. With that being said it's still been a very slow process. There's no evidence of infection. 07/20/17 on evaluation today patient appears to be doing rather well in regard to his left abdominal ulcer. He has been tolerating the dressing changes without complication fortunately there does not appear to be any evidence of infection. He still has two small openings although both of these appear to have filled in much more nicely compared to last week's evaluation. There is no obvious exposed mesh at this point. 08/03/17 on evaluation today the patient's wound on the abdominal region appears to be doing excellent. In fact the upper portion which was still open show signs of being completely epithelial eyes which is good news. He  still does have a small opening on the inferior portion but overall things have progressed extremely well in my opinion. Overall I'm hoping that she will see this completely close over the next couple of weeks. 08/17/17-He is here for preparation for an abdominal wound, there are 2 areas without complete epithelialization; the proximal area is fluid-filled and hyper granular, treated with silver nitrate, the distal aspect has a thin crusted layer of epithelium. There is no erythema to indicate infection. We will change treatment plan and he will follow-up next week 08/24/17 on evaluation today patient actually appears to be doing very well in regard to his abdominal ulcer. Unfortunately  the little area that had closed previous when I saw him actually reopened when Denny Peon saw him last week. Subsequently the lower portion seems to have almost completely resolved which is good news in fact it may be closed even at this point. Nonetheless overall I'm very pleased with the progress that has been made up to this point. [name], [name] ([id number]) Patient History Information obtained from Patient. Social History Former smoker - chew tobacco, Marital Status - Widowed, Alcohol Use - Never, Drug Use - No History, Caffeine Use - Daily. 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 Respiratory The patient has no complaints or symptoms. Cardiovascular The patient has no complaints or symptoms. Psychiatric The patient has no complaints or symptoms. Objective Constitutional Well-nourished and well-hydrated in no acute distress. Vitals Time Taken: 10:20 AM, Height: 65 in, Weight: 161 lbs, BMI: 26.8, Temperature: 98.3 F, Pulse: 70 bpm, Respiratory Rate: 18 breaths/min, Blood Pressure: 152/69 mmHg. Respiratory normal breathing without difficulty. clear to auscultation bilaterally. Cardiovascular regular rate and rhythm with normal S1, S2. Psychiatric this patient is able to make decisions and demonstrates good insight into disease process. Alert and Oriented x 3. pleasant and cooperative. General Notes: Patient's wound did not require any debridement today there is no evidence of any of the mesh sticking out of the wound bed which is also good news. Overall I'm very happy with the in general appearance of the wound there is a little streak extending from the upper portion opening that I would like to keep an eye on at this point this was discussed with the patient as well today. For that reason I'll likely see him in one  weeks time for reevaluation just to ensure everything is all right. Integumentary (Hair, Skin) Wound #2 status is Open. Original cause of wound was Gradually Appeared. The wound is located on the Left Abdomen - midline. The wound measures 0.3cm length x 0.3cm width x 0.3cm depth; 0.071cm^2 area and 0.021cm^3 volume. The wound is limited to skin breakdown. There is no tunneling or undermining noted. There is a medium amount of serous drainage noted. The wound margin is flat and intact. There is large (67-100%) red granulation within the wound bed. There is no necrotic tissue within the wound bed. The periwound skin appearance exhibited: Induration, Scarring. The periwound skin [name], [name] ([id number]) appearance did not exhibit: Callus, Crepitus, Excoriation, Rash, Dry/Scaly, Maceration, Atrophie Blanche, Cyanosis, Ecchymosis, Hemosiderin Staining, Mottled, Pallor, Rubor, Erythema. Periwound temperature was noted as No Abnormality. Assessment Active Problems ICD-10 Type 2 diabetes mellitus with other skin ulcer Unspecified open wound of abdominal wall, epigastric region without penetration into peritoneal cavity, sequela Nicotine dependence, chewing tobacco, with other nicotine-induced disorders Plan Wound Cleansing: Wound #  2 Left Abdomen - midline: Clean wound with Normal Saline. May Shower, gently pat wound dry prior to applying new dressing. Skin Barriers/Peri-Wound Care: Wound #2 Left Abdomen - midline: Skin Prep Primary Wound Dressing: Wound #2 Left Abdomen - midline: Mepitel One Contact layer Hydrafera Blue Ready Transfer Secondary Dressing: Wound #2 Left Abdomen - midline: Other - telfa island Dressing Change Frequency: Wound #2 Left Abdomen - midline: Change dressing every other day. Follow-up Appointments: Wound #2 Left Abdomen - midline: Return Appointment in 2 weeks. I'm gonna suggest currently that we go ahead and continue with the Current wound care measures.  Patients in agreement the plan. We will separately see were things stand at follow-up in one weeks time. Please see above for specific wound care orders. We will see patient for re-evaluation in 1 week(s) here in the clinic. If anything worsens or changes patient will contact our office for additional recommendations. Electronic Signature(s) Signed: 08/26/2017 11:18:14 PM By: Worthy Keeler PA-C Entered By: Worthy Keeler on 08/24/2017 10:50:08 [name], [name] ([id number], [name] ([id number]) -------------------------------------------------------------------------------- ROS/PFSH Details Patient Name: [name], [name] Date of Service: 08/24/2017 10:30 AM Medical Record Number: [id number] Patient Account Number: [id number] Date of Birth/Sex: July 10, 1945 (72 y.o. M) Treating RN: Primary Care Provider: Thomes Lolling Other Clinician: Referring Provider: Thomes Lolling Treating Provider/Extender: Melburn Hake, HOYT Weeks in Treatment: 71 Information Obtained From Patient Wound History Do you currently have one or more open woundso Yes How many open wounds do you currently haveo 2 Approximately how long have you had your woundso more than 1 year How have you been treating your wound(s) until nowo open to air Has your wound(s) ever healed and then re-openedo No Have you had any lab work done in the past montho No Have you tested positive for an antibiotic resistant organism (MRSA, VRE)o No Have you tested positive for osteomyelitis (bone infection)o No Have you had any tests for circulation on your legso No Constitutional Symptoms (General Health) Complaints and Symptoms: Negative for: Fever; Chills Eyes Medical History: Positive for: Cataracts - removed Hematologic/Lymphatic Medical History: Negative for: Anemia; Hemophilia; Human Immunodeficiency Virus; Lymphedema; Sickle Cell Disease Respiratory Complaints and Symptoms: No Complaints or Symptoms Medical History: Positive for:  Chronic Obstructive Pulmonary Disease (COPD) Negative for: Aspiration; Asthma; Pneumothorax; Sleep Apnea; Tuberculosis Cardiovascular Complaints and Symptoms: No Complaints or Symptoms Medical History: Positive for: Hypertension Negative for: Angina; Arrhythmia; Congestive Heart Failure; Coronary Artery Disease; Deep Vein Thrombosis; Hypotension; Myocardial Infarction; Peripheral Arterial Disease; Peripheral Venous Disease; Phlebitis; Vasculitis Gastrointestinal JAIVEER, PANAS ([id number]) Medical History: Negative for: Cirrhosis ; Colitis; Crohnos; Hepatitis A; Hepatitis B; Hepatitis C Endocrine Medical History: Positive for: Type II Diabetes Treated with: Oral agents Blood sugar tested every day: No Genitourinary Medical History: Negative for: End Stage Renal Disease Immunological Medical History: Negative for: Lupus Erythematosus; Raynaudos; Scleroderma Integumentary (Skin) Medical History: Negative for: History of Burn; History of pressure wounds Musculoskeletal Medical History: Positive for: Osteoarthritis Negative for: Gout; Rheumatoid Arthritis; Osteomyelitis Neurologic Medical History: Positive for: Neuropathy Negative for: Dementia; Quadriplegia; Paraplegia; Seizure Disorder Oncologic Medical History: Negative for: Received Chemotherapy; Received Radiation Past Medical History Notes: 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 Psychiatric Complaints and Symptoms: No Complaints or Symptoms Medical History: Negative for: Anorexia/bulimia; Confinement Anxiety HBO Extended History Items Eyes: Cataracts Immunizations Pneumococcal Vaccine: [name], [name] ([id number]) Received Pneumococcal Vaccination: Yes Immunization Notes: up to date Implantable Devices Family and Social History Former smoker - chew tobacco; Marital Status - Widowed;  Alcohol Use: Never; Drug Use: No History; Caffeine Use: Daily; Financial Concerns: No; Food, Clothing or Shelter Needs:  No; Support System Lacking: No; Transportation Concerns: No; Advanced Directives: No; Patient does not want information on Advanced Directives Physician Affirmation I have reviewed and agree with the above information. Electronic Signature(s) Signed: 08/26/2017 11:18:14 PM By: Worthy Keeler PA-C Entered By: Worthy Keeler on 08/24/2017 10:49:11 [name] ([id number]) -------------------------------------------------------------------------------- SuperBill Details Patient Name: [name] Date of Service: 08/24/2017 Medical Record Number: [id number] Patient Account Number: [id number] Date of Birth/Sex: Aug 17, 1945 (72 y.o. M) Treating RN: Primary Care Provider: Thomes Lolling Other Clinician: Referring Provider: Thomes Lolling Treating Provider/Extender: Melburn Hake, HOYT Weeks in Treatment: 40 Diagnosis Coding ICD-10 Codes Code Description E11.622 Type 2 diabetes mellitus with other skin ulcer Unspecified open wound of abdominal wall, epigastric region without penetration into peritoneal cavity, S31.102S sequela F17.228 Nicotine dependence, chewing tobacco, with other nicotine-induced disorders Facility Procedures CPT4 Code: [id number] Description: 01655 - WOUND CARE VISIT-LEV 2 EST PT Modifier: Quantity: 1 Physician Procedures CPT4: Description Modifier Quantity Code [id number] - WC PHYS LEVEL 3 - EST PT 1 ICD-10 Diagnosis Description E11.622 Type 2 diabetes mellitus with other skin ulcer S31.102S Unspecified open wound of abdominal wall, epigastric region without  penetration into peritoneal cavity, sequela F17.228 Nicotine dependence, chewing tobacco, with other nicotine-induced disorders Electronic Signature(s) Signed: 08/26/2017 11:18:14 PM By: Worthy Keeler PA-C Entered By: Worthy Keeler on 08/24/2017 10:50:22

## 2017-08-28 NOTE — Progress Notes (Signed)
[name], [name] ([id number]) Visit Report for 08/24/2017 Arrival Information Details Patient Name: [name], [name]. Date of Service: 08/24/2017 10:30 AM Medical Record Number: [id number] Patient Account Number: [id number] Date of Birth/Sex: Jul 29, 1945 (72 y.o. M) Treating RN: Roger Shelter Primary Care Aidyn Kellis: Thomes Lolling Other Clinician: Referring Roniel Halloran: Thomes Lolling Treating Marguerita Stapp/Extender: Sharalyn Ink in Treatment: 34 Visit Information History Since Last Visit All ordered tests and consults were completed: No Patient Arrived: Ambulatory Added or deleted any medications: No Arrival Time: 10:20 Any new allergies or adverse reactions: No Accompanied By: self Had a fall or experienced change in No Transfer Assistance: None activities of daily living that may affect Patient Identification Verified: Yes risk of falls: Secondary Verification Process Completed: Yes Signs or symptoms of abuse/neglect since last visito No Patient Requires Transmission-Based No Hospitalized since last visit: No Precautions: Implantable device outside of the clinic excluding No Patient Has Alerts: Yes cellular tissue based products placed in the center since last visit: Pain Present Now: No Electronic Signature(s) Signed: 08/24/2017 4:53:10 PM By: Roger Shelter Entered By: Roger Shelter on 08/24/2017 10:20:39 [name] ([id number]) -------------------------------------------------------------------------------- Clinic Level of Care Assessment Details Patient Name: [name] Date of Service: 08/24/2017 10:30 AM Medical Record Number: [id number] Patient Account Number: [id number] Date of Birth/Sex: [birth year] (72 y.o. M) Treating RN: Montey Hora Primary Care Dessiree Sze: Thomes Lolling Other Clinician: Referring Durwin Davisson: Thomes Lolling Treating Kelsie Kramp/Extender: Melburn Hake, HOYT Weeks in Treatment: 60 Clinic Level of Care Assessment Items TOOL 4 Quantity  Score []  - Use when only an EandM is performed on FOLLOW-UP visit 0 ASSESSMENTS - Nursing Assessment / Reassessment X - Reassessment of Co-morbidities (includes updates in patient status) 1 10 X- 1 5 Reassessment of Adherence to Treatment Plan ASSESSMENTS - Wound and Skin Assessment / Reassessment X - Simple Wound Assessment / Reassessment - one wound 1 5 []  - 0 Complex Wound Assessment / Reassessment - multiple wounds []  - 0 Dermatologic / Skin Assessment (not related to wound area) ASSESSMENTS - Focused Assessment []  - Circumferential Edema Measurements - multi extremities 0 []  - 0 Nutritional Assessment / Counseling / Intervention []  - 0 Lower Extremity Assessment (monofilament, tuning fork, pulses) []  - 0 Peripheral Arterial Disease Assessment (using hand held doppler) ASSESSMENTS - Ostomy and/or Continence Assessment and Care []  - Incontinence Assessment and Management 0 []  - 0 Ostomy Care Assessment and Management (repouching, etc.) PROCESS - Coordination of Care X - Simple Patient / Family Education for ongoing care 1 15 []  - 0 Complex (extensive) Patient / Family Education for ongoing care []  - 0 Staff obtains Programmer, systems, Records, Test Results / Process Orders []  - 0 Staff telephones HHA, Nursing Homes / Clarify orders / etc []  - 0 Routine Transfer to another Facility (non-emergent condition) []  - 0 Routine Hospital Admission (non-emergent condition) []  - 0 New Admissions / Biomedical engineer / Ordering NPWT, Apligraf, etc. []  - 0 Emergency Hospital Admission (emergent condition) X- 1 10 Simple Discharge Coordination [name], [name] ([id number]) []  - 0 Complex (extensive) Discharge Coordination PROCESS - Special Needs []  - Pediatric / Minor Patient Management 0 []  - 0 Isolation Patient Management []  - 0 Hearing / Language / Visual special needs []  - 0 Assessment of Community assistance (transportation, D/C planning, etc.) []  - 0 Additional assistance /  Altered mentation []  - 0 Support Surface(s) Assessment (bed, cushion, seat, etc.) INTERVENTIONS - Wound Cleansing / Measurement X - Simple Wound Cleansing - one wound 1 5 []  - 0 Complex  Wound Cleansing - multiple wounds X- 1 5 Wound Imaging (photographs - any number of wounds) []  - 0 Wound Tracing (instead of photographs) X- 1 5 Simple Wound Measurement - one wound []  - 0 Complex Wound Measurement - multiple wounds INTERVENTIONS - Wound Dressings X - Small Wound Dressing one or multiple wounds 1 10 []  - 0 Medium Wound Dressing one or multiple wounds []  - 0 Large Wound Dressing one or multiple wounds []  - 0 Application of Medications - topical []  - 0 Application of Medications - injection INTERVENTIONS - Miscellaneous []  - External ear exam 0 []  - 0 Specimen Collection (cultures, biopsies, blood, body fluids, etc.) []  - 0 Specimen(s) / Culture(s) sent or taken to Lab for analysis []  - 0 Patient Transfer (multiple staff / Civil Service fast streamer / Similar devices) []  - 0 Simple Staple / Suture removal (25 or less) []  - 0 Complex Staple / Suture removal (26 or more) []  - 0 Hypo / Hyperglycemic Management (close monitor of Blood Glucose) []  - 0 Ankle / Brachial Index (ABI) - do not check if billed separately X- 1 5 Vital Signs [name], [name]. ([id number]) Has the patient been seen at the hospital within the last three years: Yes Total Score: 75 Level Of Care: New/Established - Level 2 Electronic Signature(s) Signed: 08/24/2017 4:47:18 PM By: Montey Hora Entered By: Montey Hora on 08/24/2017 10:46:36 [name] ([id number]) -------------------------------------------------------------------------------- Encounter Discharge Information Details Patient Name: [name] Date of Service: 08/24/2017 10:30 AM Medical Record Number: [id number] Patient Account Number: [id number] Date of Birth/Sex: [birth year] (72 y.o. M) Treating RN: Montey Hora Primary Care Tristyn Pharris:  Thomes Lolling Other Clinician: Referring Ailynn Gow: Thomes Lolling Treating Aelyn Stanaland/Extender: Sharalyn Ink in Treatment: 28 Encounter Discharge Information Items Discharge Condition: Stable Ambulatory Status: Ambulatory Discharge Destination: Home Transportation: Private Auto Accompanied By: self Schedule Follow-up Appointment: Yes Clinical Summary of Care: Electronic Signature(s) Signed: 08/24/2017 10:54:10 AM By: Montey Hora Entered By: Montey Hora on 08/24/2017 10:54:10 [name] ([id number]) -------------------------------------------------------------------------------- Lower Extremity Assessment Details Patient Name: [name] Date of Service: 08/24/2017 10:30 AM Medical Record Number: [id number] Patient Account Number: [id number] Date of Birth/Sex: 1945/06/26 (72 y.o. M) Treating RN: Roger Shelter Primary Care Trygg Mantz: Thomes Lolling Other Clinician: Referring Dyer Klug: Thomes Lolling Treating Satoya Feeley/Extender: Melburn Hake, HOYT Weeks in Treatment: 51 Electronic Signature(s) Signed: 08/24/2017 4:53:10 PM By: Roger Shelter Entered By: Roger Shelter on 08/24/2017 10:22:38 [name], [name] ([id number]) -------------------------------------------------------------------------------- Multi Wound Chart Details Patient Name: [name] Date of Service: 08/24/2017 10:30 AM Medical Record Number: [id number] Patient Account Number: [id number] Date of Birth/Sex: February 24, 1946 (72 y.o. M) Treating RN: Montey Hora Primary Care Latika Kronick: Thomes Lolling Other Clinician: Referring Kaiser Belluomini: Thomes Lolling Treating Valree Feild/Extender: Melburn Hake, HOYT Weeks in Treatment: 64 Vital Signs Height(in): 65 Pulse(bpm): 70 Weight(lbs): 161 Blood Pressure(mmHg): 152/69 Body Mass Index(BMI): 27 Temperature(F): 98.3 Respiratory Rate 18 (breaths/min): Photos: [2:No Photos] [N/A:N/A] Wound Location: [2:Left Abdomen - midline] [N/A:N/A] Wounding Event:  [2:Gradually Appeared] [N/A:N/A] Primary Etiology: [2:Atypical] [N/A:N/A] Comorbid History: [2:Cataracts, Chronic Obstructive N/A Pulmonary Disease (COPD), Hypertension, Type II Diabetes, Osteoarthritis, Neuropathy] Date Acquired: [2:05/17/2015] [N/A:N/A] Weeks of Treatment: [2:59] [N/A:N/A] Wound Status: [2:Open] [N/A:N/A] Clustered Wound: [2:Yes] [N/A:N/A] Clustered Quantity: [2:1] [N/A:N/A] Measurements L x W x D [2:0.3x0.3x0.3] [N/A:N/A] (cm) Area (cm) : [2:0.071] [N/A:N/A] Volume (cm) : [2:0.021] [N/A:N/A] % Reduction in Area: [2:98.70%] [N/A:N/A] % Reduction in Volume: [2:96.00%] [N/A:N/A] Classification: [2:Full Thickness Without Exposed Support Structures] [N/A:N/A] Exudate Amount: [2:Medium] [N/A:N/A] Exudate Type: [2:Serous] [N/A:N/A] Exudate  Color: [2:amber] [N/A:N/A] Wound Margin: [2:Flat and Intact] [N/A:N/A] Granulation Amount: [2:Large (67-100%)] [N/A:N/A] Granulation Quality: [2:Red] [N/A:N/A] Necrotic Amount: [2:None Present (0%)] [N/A:N/A] Exposed Structures: [2:Fascia: No Fat Layer (Subcutaneous Tissue) Exposed: No Tendon: No Muscle: No Joint: No Bone: No Limited to Skin Breakdown] [N/A:N/A] Epithelialization: Large (67-100%) N/A N/A Periwound Skin Texture: Induration: Yes N/A N/A Scarring: Yes Excoriation: No Callus: No Crepitus: No Rash: No Periwound Skin Moisture: Maceration: No N/A N/A Dry/Scaly: No Periwound Skin Color: Atrophie Blanche: No N/A N/A Cyanosis: No Ecchymosis: No Erythema: No Hemosiderin Staining: No Mottled: No Pallor: No Rubor: No Temperature: No Abnormality N/A N/A Tenderness on Palpation: No N/A N/A Wound Preparation: Ulcer Cleansing: N/A N/A Rinsed/Irrigated with Saline Topical Anesthetic Applied: Other: lidocaine 4% Treatment Notes Electronic Signature(s) Signed: 08/24/2017 4:47:18 PM By: Montey Hora Entered By: Montey Hora on 08/24/2017 10:44:52 [name]  ([id number]) -------------------------------------------------------------------------------- [place] Details Patient Name: [name], [name] Date of Service: 08/24/2017 10:30 AM Medical Record Number: [id number] Patient Account Number: [id number] Date of Birth/Sex: 06/08/1945 (72 y.o. M) Treating RN: Montey Hora Primary Care Brandyn Thien: Thomes Lolling Other Clinician: Referring Rashonda Warrior: Thomes Lolling Treating Arlette Schaad/Extender: Melburn Hake, HOYT Weeks in Treatment: 76 Active Inactive ` Abuse / Safety / Falls / Self Care Management Nursing Diagnoses: Impaired physical mobility Goals: Patient will remain injury free Date Initiated: 07/03/2016 Target Resolution Date: 09/14/2016 Goal Status: Active Interventions: Assess fall risk on admission and as needed Notes: ` Orientation to the Wound Care Program Nursing Diagnoses: Knowledge deficit related to the wound healing center program Goals: Patient/caregiver will verbalize understanding of the [place] Date Initiated: 07/03/2016 Target Resolution Date: 09/15/2016 Goal Status: Active Interventions: Provide education on orientation to the wound center Notes: ` Wound/Skin Impairment Nursing Diagnoses: Impaired tissue integrity Goals: Patient/caregiver will verbalize understanding of skin care regimen Date Initiated: 07/03/2016 Target Resolution Date: 09/15/2016 Goal Status: Active Ulcer/skin breakdown will have a volume reduction of 30% by week 4 Date Initiated: 07/03/2016 Target Resolution Date: 09/15/2016 [name], [name] ([id number]) Goal Status: Active Ulcer/skin breakdown will have a volume reduction of 50% by week 8 Date Initiated: 07/03/2016 Target Resolution Date: 09/15/2016 Goal Status: Active Ulcer/skin breakdown will have a volume reduction of 80% by week 12 Date Initiated: 07/03/2016 Target Resolution Date: 09/15/2016 Goal Status: Active Ulcer/skin breakdown will heal within 14 weeks Date  Initiated: 07/03/2016 Target Resolution Date: 09/15/2016 Goal Status: Active Interventions: Assess patient/caregiver ability to obtain necessary supplies Assess patient/caregiver ability to perform ulcer/skin care regimen upon admission and as needed Assess ulceration(s) every visit Notes: Electronic Signature(s) Signed: 08/24/2017 4:47:18 PM By: Montey Hora Entered By: Montey Hora on 08/24/2017 10:44:43 [name] ([id number]) -------------------------------------------------------------------------------- Pain Assessment Details Patient Name: [name] Date of Service: 08/24/2017 10:30 AM Medical Record Number: [id number] Patient Account Number: [id number] Date of Birth/Sex: 07/11/1945 (72 y.o. M) Treating RN: Roger Shelter Primary Care Dago Jungwirth: Thomes Lolling Other Clinician: Referring Tonye Tancredi: Thomes Lolling Treating Harlei Lehrmann/Extender: Melburn Hake, HOYT Weeks in Treatment: 13 Active Problems Location of Pain Severity and Description of Pain Patient Has Paino No Site Locations Pain Management and Medication Current Pain Management: Electronic Signature(s) Signed: 08/24/2017 4:53:10 PM By: Roger Shelter Entered By: Roger Shelter on 08/24/2017 10:20:45 [name] ([id number]) -------------------------------------------------------------------------------- Patient/Caregiver Education Details Patient Name: [name] Date of Service: 08/24/2017 10:30 AM Medical Record Number: [id number] Patient Account Number: [id number] Date of Birth/Gender: 04/28/1945 (72 y.o. M) Treating RN: Montey Hora Primary Care Physician: Thomes Lolling Other Clinician: Referring Physician: Thomes Lolling  Treating Physician/Extender: Worthy Keeler Weeks in Treatment: 48 Education Assessment Education Provided To: Patient Education Topics Provided Wound/Skin Impairment: Handouts: Other: wound care as ordered Methods: Demonstration, Explain/Verbal Responses: State  content correctly Electronic Signature(s) Signed: 08/24/2017 4:47:18 PM By: Montey Hora Entered By: Montey Hora on 08/24/2017 10:54:31 [name] ([id number]) -------------------------------------------------------------------------------- Wound Assessment Details Patient Name: [name] Date of Service: 08/24/2017 10:30 AM Medical Record Number: [id number] Patient Account Number: [id number] Date of Birth/Sex: 03-Jan-1946 (72 y.o. M) Treating RN: Roger Shelter Primary Care Zamauri Nez: Thomes Lolling Other Clinician: Referring Lorri Fukuhara: Thomes Lolling Treating Shamar Kracke/Extender: Melburn Hake, HOYT Weeks in Treatment: 75 Wound Status Wound Number: 2 Primary Atypical Etiology: Wound Location: Left Abdomen - midline Wound Open Wounding Event: Gradually Appeared Status: Date Acquired: 05/17/2015 Comorbid Cataracts, Chronic Obstructive Pulmonary Weeks Of Treatment: 59 History: Disease (COPD), Hypertension, Type II Clustered Wound: Yes Diabetes, Osteoarthritis, Neuropathy Photos Photo Uploaded By: Roger Shelter on 08/24/2017 16:57:52 Wound Measurements Length: (cm) 0.3 Width: (cm) 0.3 Depth: (cm) 0.3 Clustered Quantity: 1 Area: (cm) 0.071 Volume: (cm) 0.021 % Reduction in Area: 98.7% % Reduction in Volume: 96% Epithelialization: Large (67-100%) Tunneling: No Undermining: No Wound Description Full Thickness Without Exposed Support Classification: Structures Wound Margin: Flat and Intact Exudate Medium Amount: Exudate Type: Serous Exudate Color: amber Foul Odor After Cleansing: No Slough/Fibrino No Wound Bed Granulation Amount: Large (67-100%) Exposed Structure Granulation Quality: Red Fascia Exposed: No Necrotic Amount: None Present (0%) Fat Layer (Subcutaneous Tissue) Exposed: No Tendon Exposed: No Muscle Exposed: No Joint Exposed: No [name], [name]. ([id number]) Bone Exposed: No Limited to Skin Breakdown Periwound Skin Texture Texture  Color No Abnormalities Noted: No No Abnormalities Noted: No Callus: No Atrophie Blanche: No Crepitus: No Cyanosis: No Excoriation: No Ecchymosis: No Induration: Yes Erythema: No Rash: No Hemosiderin Staining: No Scarring: Yes Mottled: No Pallor: No Moisture Rubor: No No Abnormalities Noted: No Dry / Scaly: No Temperature / Pain Maceration: No Temperature: No Abnormality Wound Preparation Ulcer Cleansing: Rinsed/Irrigated with Saline Topical Anesthetic Applied: Other: lidocaine 4%, Treatment Notes Wound #2 (Left Abdomen - midline) 1. Cleansed with: Clean wound with Normal Saline 4. Dressing Applied: Hydrafera Blue Mepitel 5. Secondary [place] Signature(s) Signed: 08/24/2017 4:53:10 PM By: Roger Shelter Entered By: Roger Shelter on 08/24/2017 10:22:29 [name], [name] ([id number]) -------------------------------------------------------------------------------- [name] Details Patient Name: [name] Date of Service: 08/24/2017 10:30 AM Medical Record Number: [id number] Patient Account Number: [id number] Date of Birth/Sex: Jul 02, 1945 (72 y.o. M) Treating RN: Roger Shelter Primary Care Ludwika Rodd: Thomes Lolling Other Clinician: Referring Maricella Filyaw: Thomes Lolling Treating Delbra Zellars/Extender: Melburn Hake, HOYT Weeks in Treatment: 68 Vital Signs Time Taken: 10:20 Temperature (F): 98.3 Height (in): 65 Pulse (bpm): 70 Weight (lbs): 161 Respiratory Rate (breaths/min): 18 Body Mass Index (BMI): 26.8 Blood Pressure (mmHg): 152/69 Reference Range: 80 - 120 mg / dl Electronic Signature(s) Signed: 08/24/2017 4:53:10 PM By: Roger Shelter Entered By: Roger Shelter on 08/24/2017 10:21:08

## 2017-08-31 ENCOUNTER — Encounter: Payer: Medicare HMO | Admitting: Physician Assistant

## 2017-08-31 DIAGNOSIS — E11622 Type 2 diabetes mellitus with other skin ulcer: Secondary | ICD-10-CM | POA: Diagnosis not present

## 2017-09-04 NOTE — Progress Notes (Signed)
JONOVAN, BOEDECKER ([id number]) Visit Report for 08/31/2017 Chief Complaint Document Details Patient Name: [name], [name]. Date of Service: 08/31/2017 10:15 AM Medical Record Number: [id number] Patient Account Number: [id number] Date of Birth/Sex: [birth year] (72 y.o. M) Treating RN: Montey Hora Primary Care Provider: Thomes Lolling Other Clinician: Referring Provider: Thomes Lolling Treating Provider/Extender: Melburn Hake, Wave Calzada Weeks in Treatment: 60 Information Obtained from: Patient Chief Complaint anterior abdominal wall in the epigastric region Electronic Signature(s) Signed: 09/03/2017 12:55:36 AM By: Worthy Keeler PA-C Entered By: Worthy Keeler on 08/31/2017 10:33:42 [name] ([id number]) -------------------------------------------------------------------------------- HPI Details Patient Name: [name] Date of Service: 08/31/2017 10:15 AM Medical Record Number: [id number] Patient Account Number: [id number] Date of Birth/Sex: 28-Jun-1945 (72 y.o. M) Treating RN: Montey Hora Primary Care Provider: Thomes Lolling Other Clinician: Referring Provider: Thomes Lolling Treating Provider/Extender: Melburn Hake, Isiaha Greenup Weeks in Treatment: 5 History of Present Illness HPI Description: 72 year old gentleman has been seen recently by his PCP Dr. Thomes Lolling, who sees him with a history of diabetes mellitus, peripheral neuropathy, B12 deficiency, hypertension, osteoarthritis and the patient had recent blood sugar checked which was 163. Past medical history includes essential hypertension, incisional hernia, diabetes mellitus, colon polyps, COPD, tobacco abuse in the past, chewing tobacco, GERD. He has also been treated for chronic back pain with no sciatica and is on oxycodone. His last hemoglobin A1c was 6.9% His abdominal surgery was done at [facility] over 15 years ago and gradually there was sutures which protruded and then there was mesh which protruded. He washes this during  his shower but does not put any dressing and the wound is covered with a lot of debris. He has never had a surgical consultation for this. 07/10/2016 -- the patient has spoken to his PCP who said he was going to call me but I have not yet had a phone conversation with the physician. From what I understand the physician was reluctant to have a surgical opinion because of the patient's several comorbidities and he feels that the surgery may be detrimental to his overall health 07/24/2016 -- the patient's daughter is at the bedside and we've had a detailed discussion regarding his options for a surgical opinion and possible surgical intervention before this becomes a emergent situation. She will seek primary care input and take the father to [facility] for a surgical opinion. 07/31/2016 -- the patient's daughter has organized for a surgical opinion at [facility] on June 14 08/14/2016 -- it has been about 3 weeks since he has stopped chewing tobacco. 08/28/2016 -- he was seen at [facility] by Dr. Roby Lofts -- the assessment was based on physical examination and review of his previous history of surgical repair. the patient had undergone a ventral hernia repair in 1998 with Dr. Quay Burow at [facility] at a prior open cholecystectomy incision. he was evaluated by Dr. Denice Paradise in 2013 and at that time it was decided to watch and wait. Dr. Payton Doughty recommended a repeat CT scan and requested records from dura and regional operative records and would follow-up the patient, after Mr. Urbas PCP Dr. Gwenlyn Saran reviewed him for overall fitness for surgery. 09/04/16 patient's wound actually appear to be doing well on evaluation today. He is not really experience any discomfort and the right abdominal wound is dry and almost appears to be healing up. He continues to have some drainage from the left but this seems to be more of a potential seroma which with milking is able to  be cleared out. I discussed with  patient that when he performs his dressing changes at home it would be beneficial for him to do this as well. Otherwise there's no evidence of infection. He is still waiting on his CT scan and appointments to determine whether he is going to have another surgery. 09/11/2016 -- the patient has had a CT scan and is awaiting his surgical appointment prior to deciding on any surgical intervention. 09/25/16 patient presents today for fault evaluation concerning his ongoing abdominal surgical wounds. Unfortunately they do not appear to be improving significantly but rather fluctuate from a little better to little worse week by week. Obviously this is somewhat frustrating for him. 10/02/2016 -- the patient has completed his CT scan, and has a review by the surgeons at the end of August. He has a new area which has opened up a little below his left abdominal wound. 10/16/16 on evaluation today patient's wounds on the abdominal region appears to be doing about the same. He actually has an appointment with his surgeon on the 21st for evaluation regarding the required products appointment. Obviously he is having a difficult time with the feeling of these wounds. Fortunately there does not appear to be any evidence of local infection such as significant amount of erythema or streaking. She also has an audio, vomiting, or diarrhea. He is not have any discomfort at this point. ARHAAN, CHESNUT ([id number]) 10/30/2016 -- the surgical review is tomorrow and I am awaiting the input of the surgeon. 11/16/2016 -- I had a call from his surgeon Dr. Payton Doughty, on phone number [phone] who was kind enough to call me regarding his care. After a thorough review and having consulted the abdominal wall reconstruction team they have decided that surgery will be too extensive and rather moribund for this gentleman and have asked him to continue with local care at the wound center. She will send me an official note via  Epic. 03/09/2017 -- the patient comes for some abdominal wall wounds which were caused by protruding mesh and complete surgical review was done at [facility] and no surgery has been recommended. He continues to get palliative care and the right abdominal wound is completely healed. He has a few open spots on the left which are being treated with palliative care and we see him once a month 05/04/17 on evaluation today Mr. Saltzman presents for follow-up concerning his abdominal surgical ulcer. He does have some additional mashed taking out of the wound at this point although he has made a lot of progress since last time I saw him. There is good epithelialization in a lot of areas although there is still mesh poking through which is preventing complete closure. Fortunately there does not appear to be any evidence of infection. 05/18/17 on evaluation today patient appears to be doing very well in regard to his Adamo ulcer. Of the two areas that were previously open one has completely close in the proximal portion of the wound and the remainder of the opening which is the inferior portion is significantly smaller and there does not appear to be a lot of significant issue with mesh poking through the skin currently. In fact this is a nice rather circular indention where he has granulation in the base. There was some of the mesh still showing through but very little. Overall he has made significant improvements in my opinion since even two weeks ago when I saw him. 06/22/17 on evaluation today patient's abdominal ulcer  on inspection appears to be doing much better. He still has 2 tiny openings at either end of the surgical site where he has scar tissue noted at this point. Fortunately there does not appear to be any evidence of infection which is good news. He does still have some evidence of the mesh showing which still seems to be preventing this from fully closing but fortunately this seems to be  minimal even compared to my last evaluation. Overall he has been doing very well. 07/06/17 on evaluation today patient's abdominal ulcer actually appears to be doing much better. He's showing signs of improvement little by little. Overall I feel this is appropriate as far as the progression that has been made. With that being said it's still been a very slow process. There's no evidence of infection. 07/20/17 on evaluation today patient appears to be doing rather well in regard to his left abdominal ulcer. He has been tolerating the dressing changes without complication fortunately there does not appear to be any evidence of infection. He still has two small openings although both of these appear to have filled in much more nicely compared to last week's evaluation. There is no obvious exposed mesh at this point. 08/03/17 on evaluation today the patient's wound on the abdominal region appears to be doing excellent. In fact the upper portion which was still open show signs of being completely epithelial eyes which is good news. He still does have a small opening on the inferior portion but overall things have progressed extremely well in my opinion. Overall I'm hoping that she will see this completely close over the next couple of weeks. 08/17/17-He is here for preparation for an abdominal wound, there are 2 areas without complete epithelialization; the proximal area is fluid-filled and hyper granular, treated with silver nitrate, the distal aspect has a thin crusted layer of epithelium. There is no erythema to indicate infection. We will change treatment plan and he will follow-up next week 08/24/17 on evaluation today patient actually appears to be doing very well in regard to his abdominal ulcer. Unfortunately the little area that had closed previous when I saw him actually reopened when Denny Peon saw him last week. Subsequently the lower portion seems to have almost completely resolved which is good news  in fact it may be closed even at this point. Nonetheless overall I'm very pleased with the progress that has been made up to this point. 08/31/17 on evaluation today patient's wound actually appears to be about the same as what I saw a previous. He does not seem to be showing any signs of worsening although there's no signs of necessarily improving eider. It does appear that the patient may actually have a little bit of cellulitis in the surrounding scar tissue region where the two small openings are at this point. I look back at pictures from prior weeks and it looks like that slowly this has gotten a little bit more erythematous and I've kind of been watching this but it's definite today compared to previous. For this reason I feel he made the at the point where antibiotic therapy would be beneficial for him. KAMIL, MCHAFFIE ([id number]) Electronic Signature(s) Signed: 09/03/2017 12:55:36 AM By: Worthy Keeler PA-C Entered By: Worthy Keeler on 09/03/2017 00:51:10 FREDRICK, GEOGHEGAN ([id number]) -------------------------------------------------------------------------------- Physical Exam Details Patient Name: SHERRELL, FARISH Date of Service: 08/31/2017 10:15 AM Medical Record Number: [id number] Patient Account Number: [id number] Date of Birth/Sex: 02-08-1946 (72 y.o. M) Treating RN: Montey Hora  Primary Care Provider: Thomes Lolling Other Clinician: Referring Provider: Thomes Lolling Treating Provider/Extender: Melburn Hake, Sayda Grable Weeks in Treatment: 23 Constitutional Well-nourished and well-hydrated in no acute distress. Respiratory normal breathing without difficulty. clear to auscultation bilaterally. Cardiovascular regular rate and rhythm with normal S1, S2. Psychiatric this patient is able to make decisions and demonstrates good insight into disease process. Alert and Oriented x 3. pleasant and cooperative. Notes Patient's wound did not require any sharp debridement today. He does seem  to have some evidence of cellulitis noted in the region of scar tissue this is something I noted last week as a possibility but was in 100% sure this week I feel like it's definitely a little worse. For this reason I do believe that antibiotic therapy would be appropriate at this time. Electronic Signature(s) Signed: 09/03/2017 12:55:36 AM By: Worthy Keeler PA-C Entered By: Worthy Keeler on 09/03/2017 00:52:05 [name] ([id number]) -------------------------------------------------------------------------------- Physician Orders Details Patient Name: ELDWIN, VOLKOV Date of Service: 08/31/2017 10:15 AM Medical Record Number: [id number] Patient Account Number: [id number] Date of Birth/Sex: September 06, 1945 (72 y.o. M) Treating RN: Montey Hora Primary Care Provider: Thomes Lolling Other Clinician: Referring Provider: Thomes Lolling Treating Provider/Extender: Melburn Hake, Zabria Liss Weeks in Treatment: 10 Verbal / Phone Orders: No Diagnosis Coding Wound Cleansing Wound #2 Left Abdomen - midline o Clean wound with Normal Saline. o May Shower, gently pat wound dry prior to applying new dressing. Skin Barriers/Peri-Wound Care Wound #2 Left Abdomen - midline o Skin Prep Primary Wound Dressing Wound #2 Left Abdomen - midline o Silver Collagen Secondary Dressing Wound #2 Left Abdomen - midline o Other - [place] Dressing Change Frequency Wound #2 Left Abdomen - midline o Change dressing every other day. Follow-up Appointments Wound #2 Left Abdomen - midline o Return Appointment in 1 week. Patient Medications Allergies: aspirin Notifications Medication Indication Start End doxycycline hyclate 08/31/2017 DOSE 1 - oral 100 mg capsule - 1 capsule oral taken 2 times a day for 10 days take along with a probiotic Electronic Signature(s) Signed: 08/31/2017 10:36:04 AM By: Worthy Keeler PA-C Entered By: Worthy Keeler on 08/31/2017 10:36:04 [name]  ([id number]) -------------------------------------------------------------------------------- Problem List Details Patient Name: [name] Date of Service: 08/31/2017 10:15 AM Medical Record Number: [id number] Patient Account Number: [id number] Date of Birth/Sex: 04-20-1945 (72 y.o. M) Treating RN: Montey Hora Primary Care Provider: Thomes Lolling Other Clinician: Referring Provider: Thomes Lolling Treating Provider/Extender: Sharalyn Ink in Treatment: 35 Active Problems ICD-10 Evaluated Encounter Code Description Active Date Today Diagnosis E11.622 Type 2 diabetes mellitus with other skin ulcer 07/03/2016 No Yes S31.102S Unspecified open wound of abdominal wall, epigastric region 07/03/2016 No Yes without penetration into peritoneal cavity, sequela F17.228 Nicotine dependence, chewing tobacco, with other nicotine- 07/03/2016 No Yes induced disorders Inactive Problems Resolved Problems ICD-10 Code Description Active Date Resolved Date S31.100A Unspecified open wound of abdominal wall, right upper quadrant 07/03/2016 07/03/2016 without penetration into peritoneal cavity, initial encounter Electronic Signature(s) Signed: 09/03/2017 12:55:36 AM By: Worthy Keeler PA-C Entered By: Worthy Keeler on 08/31/2017 10:33:36 [name] ([id number]) -------------------------------------------------------------------------------- Progress Note Details Patient Name: [name] Date of Service: 08/31/2017 10:15 AM Medical Record Number: [id number] Patient Account Number: [id number] Date of Birth/Sex: 1945/09/20 (72 y.o. M) Treating RN: Montey Hora Primary Care Provider: Thomes Lolling Other Clinician: Referring Provider: Thomes Lolling Treating Provider/Extender: Melburn Hake, Brennen Gardiner Weeks in Treatment: 60 Subjective Chief Complaint Information obtained from Patient anterior abdominal wall in the epigastric  region History of Present Illness (HPI) 72 year old gentleman has  been seen recently by his PCP Dr. Thomes Lolling, who sees him with a history of diabetes mellitus, peripheral neuropathy, B12 deficiency, hypertension, osteoarthritis and the patient had recent blood sugar checked which was 163. Past medical history includes essential hypertension, incisional hernia, diabetes mellitus, colon polyps, COPD, tobacco abuse in the past, chewing tobacco, GERD. He has also been treated for chronic back pain with no sciatica and is on oxycodone. His last hemoglobin A1c was 6.9% His abdominal surgery was done at [facility] over 15 years ago and gradually there was sutures which protruded and then there was mesh which protruded. He washes this during his shower but does not put any dressing and the wound is covered with a lot of debris. He has never had a surgical consultation for this. 07/10/2016 -- the patient has spoken to his PCP who said he was going to call me but I have not yet had a phone conversation with the physician. From what I understand the physician was reluctant to have a surgical opinion because of the patient's several comorbidities and he feels that the surgery may be detrimental to his overall health 07/24/2016 -- the patient's daughter is at the bedside and we've had a detailed discussion regarding his options for a surgical opinion and possible surgical intervention before this becomes a emergent situation. She will seek primary care input and take the father to [facility] for a surgical opinion. 07/31/2016 -- the patient's daughter has organized for a surgical opinion at [facility] on June 14 08/14/2016 -- it has been about 3 weeks since he has stopped chewing tobacco. 08/28/2016 -- he was seen at [facility] by Dr. Roby Lofts -- the assessment was based on physical examination and review of his previous history of surgical repair. the patient had undergone a ventral hernia repair in 1998 with Dr. Quay Burow at [facility] at a prior open  cholecystectomy incision. he was evaluated by Dr. Denice Paradise in 2013 and at that time it was decided to watch and wait. Dr. Payton Doughty recommended a repeat CT scan and requested records from dura and regional operative records and would follow-up the patient, after Mr. Dibert PCP Dr. Gwenlyn Saran reviewed him for overall fitness for surgery. 09/04/16 patient's wound actually appear to be doing well on evaluation today. He is not really experience any discomfort and the right abdominal wound is dry and almost appears to be healing up. He continues to have some drainage from the left but this seems to be more of a potential seroma which with milking is able to be cleared out. I discussed with patient that when he performs his dressing changes at home it would be beneficial for him to do this as well. Otherwise there's no evidence of infection. He is still waiting on his CT scan and appointments to determine whether he is going to have another surgery. 09/11/2016 -- the patient has had a CT scan and is awaiting his surgical appointment prior to deciding on any surgical intervention. 09/25/16 patient presents today for fault evaluation concerning his ongoing abdominal surgical wounds. Unfortunately they do not appear to be improving significantly but rather fluctuate from a little better to little worse week by week. Obviously this is somewhat frustrating for him. 10/02/2016 -- the patient has completed his CT scan, and has a review by the surgeons at the end of August. He has a new area which has opened up a little below  his left abdominal wound. SERGEY, ISHLER ([id number]) 10/16/16 on evaluation today patient's wounds on the abdominal region appears to be doing about the same. He actually has an appointment with his surgeon on the 21st for evaluation regarding the required products appointment. Obviously he is having a difficult time with the feeling of these wounds. Fortunately there does not appear to be any  evidence of local infection such as significant amount of erythema or streaking. She also has an audio, vomiting, or diarrhea. He is not have any discomfort at this point. 10/30/2016 -- the surgical review is tomorrow and I am awaiting the input of the surgeon. 11/16/2016 -- I had a call from his surgeon Dr. Payton Doughty, on phone number [phone] who was kind enough to call me regarding his care. After a thorough review and having consulted the abdominal wall reconstruction team they have decided that surgery will be too extensive and rather moribund for this gentleman and have asked him to continue with local care at the wound center. She will send me an official note via Epic. 03/09/2017 -- the patient comes for some abdominal wall wounds which were caused by protruding mesh and complete surgical review was done at [facility] and no surgery has been recommended. He continues to get palliative care and the right abdominal wound is completely healed. He has a few open spots on the left which are being treated with palliative care and we see him once a month 05/04/17 on evaluation today Mr. Embleton presents for follow-up concerning his abdominal surgical ulcer. He does have some additional mashed taking out of the wound at this point although he has made a lot of progress since last time I saw him. There is good epithelialization in a lot of areas although there is still mesh poking through which is preventing complete closure. Fortunately there does not appear to be any evidence of infection. 05/18/17 on evaluation today patient appears to be doing very well in regard to his Adamo ulcer. Of the two areas that were previously open one has completely close in the proximal portion of the wound and the remainder of the opening which is the inferior portion is significantly smaller and there does not appear to be a lot of significant issue with mesh poking through the skin currently. In fact this is a  nice rather circular indention where he has granulation in the base. There was some of the mesh still showing through but very little. Overall he has made significant improvements in my opinion since even two weeks ago when I saw him. 06/22/17 on evaluation today patient's abdominal ulcer on inspection appears to be doing much better. He still has 2 tiny openings at either end of the surgical site where he has scar tissue noted at this point. Fortunately there does not appear to be any evidence of infection which is good news. He does still have some evidence of the mesh showing which still seems to be preventing this from fully closing but fortunately this seems to be minimal even compared to my last evaluation. Overall he has been doing very well. 07/06/17 on evaluation today patient's abdominal ulcer actually appears to be doing much better. He's showing signs of improvement little by little. Overall I feel this is appropriate as far as the progression that has been made. With that being said it's still been a very slow process. There's no evidence of infection. 07/20/17 on evaluation today patient appears to be doing rather well in  regard to his left abdominal ulcer. He has been tolerating the dressing changes without complication fortunately there does not appear to be any evidence of infection. He still has two small openings although both of these appear to have filled in much more nicely compared to last week's evaluation. There is no obvious exposed mesh at this point. 08/03/17 on evaluation today the patient's wound on the abdominal region appears to be doing excellent. In fact the upper portion which was still open show signs of being completely epithelial eyes which is good news. He still does have a small opening on the inferior portion but overall things have progressed extremely well in my opinion. Overall I'm hoping that she will see this completely close over the next couple of  weeks. 08/17/17-He is here for preparation for an abdominal wound, there are 2 areas without complete epithelialization; the proximal area is fluid-filled and hyper granular, treated with silver nitrate, the distal aspect has a thin crusted layer of epithelium. There is no erythema to indicate infection. We will change treatment plan and he will follow-up next week 08/24/17 on evaluation today patient actually appears to be doing very well in regard to his abdominal ulcer. Unfortunately the little area that had closed previous when I saw him actually reopened when Denny Peon saw him last week. Subsequently the lower portion seems to have almost completely resolved which is good news in fact it may be closed even at this point. Nonetheless overall I'm very pleased with the progress that has been made up to this point. 08/31/17 on evaluation today patient's wound actually appears to be about the same as what I saw a previous. He does not LODEN, LAURENT. ([id number]) seem to be showing any signs of worsening although there's no signs of necessarily improving eider. It does appear that the patient may actually have a little bit of cellulitis in the surrounding scar tissue region where the two small openings are at this point. I look back at pictures from prior weeks and it looks like that slowly this has gotten a little bit more erythematous and I've kind of been watching this but it's definite today compared to previous. For this reason I feel he made the at the point where antibiotic therapy would be beneficial for him. Patient History Information obtained from Patient. Social History Former smoker - chew tobacco, Marital Status - Widowed, Alcohol Use - Never, Drug Use - No History, Caffeine Use - Daily. 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 Respiratory The patient has no complaints or symptoms. Cardiovascular The patient has no complaints or symptoms. Psychiatric The patient has no complaints or symptoms. Objective Constitutional Well-nourished and well-hydrated in no acute distress. Vitals Time Taken: 10:15 AM, Height: 65 in, Weight: 161 lbs, BMI: 26.8, Temperature: 97.7 F, Pulse: 72 bpm, Respiratory Rate: 18 breaths/min, Blood Pressure: 140/73 mmHg. Respiratory normal breathing without difficulty. clear to auscultation bilaterally. Cardiovascular regular rate and rhythm with normal S1, S2. Psychiatric this patient is able to make decisions and demonstrates good insight into disease process. Alert and Oriented x 3. pleasant and cooperative. General Notes: Patient's wound did not require any sharp debridement today. He does seem to have some evidence of cellulitis noted in the region of scar tissue this is something I noted last week as a possibility but was in 100% sure this week I feel  like it's definitely a little worse. For this reason I do believe that antibiotic therapy would be appropriate at this time. DEANGELO, BERNS ([id number]) Integumentary (Hair, Skin) Wound #2 status is Open. Original cause of wound was Gradually Appeared. The wound is located on the Left Abdomen - midline. The wound measures 4.4cm length x 0.3cm width x 0.2cm depth; 1.037cm^2 area and 0.207cm^3 volume. The wound is limited to skin breakdown. There is no tunneling or undermining noted. There is a large amount of serous drainage noted. The wound margin is flat and intact. There is large (67-100%) red granulation within the wound bed. There is no necrotic tissue within the wound bed. The periwound skin appearance exhibited: Induration, Scarring. The periwound skin appearance did not exhibit: Callus, Crepitus, Excoriation, Rash, Dry/Scaly, Maceration, Atrophie Blanche, Cyanosis, Ecchymosis, Hemosiderin Staining, Mottled, Pallor, Rubor,  Erythema. Periwound temperature was noted as No Abnormality. Assessment Active Problems ICD-10 Type 2 diabetes mellitus with other skin ulcer Unspecified open wound of abdominal wall, epigastric region without penetration into peritoneal cavity, sequela Nicotine dependence, chewing tobacco, with other nicotine-induced disorders Plan Wound Cleansing: Wound #2 Left Abdomen - midline: Clean wound with Normal Saline. May Shower, gently pat wound dry prior to applying new dressing. Skin Barriers/Peri-Wound Care: Wound #2 Left Abdomen - midline: Skin Prep Primary Wound Dressing: Wound #2 Left Abdomen - midline: Silver Collagen Secondary Dressing: Wound #2 Left Abdomen - midline: Other - [place] Dressing Change Frequency: Wound #2 Left Abdomen - midline: Change dressing every other day. Follow-up Appointments: Wound #2 Left Abdomen - midline: Return Appointment in 1 week. The following medication(s) was prescribed: doxycycline hyclate oral 100 mg capsule 1 1 capsule oral taken 2 times a day for 10 days take along with a probiotic starting 08/31/2017 I'm gonna suggest that we continue to treat the wound with the above wound care orders over the next week. Patient is in agreement that plan. Subsequently I will also go ahead and call in a prescription for doxycycline for him and this was sent electronically to the pharmacy. We will subsequently see were things stand at follow-up. BRAYTON, BAUMGARTNER ([id number]) Please see above for specific wound care orders. We will see patient for re-evaluation in 1 week(s) here in the clinic. If anything worsens or changes patient will contact our office for additional recommendations. Electronic Signature(s) Signed: 09/03/2017 12:55:36 AM By: Worthy Keeler PA-C Entered By: Worthy Keeler on 09/03/2017 00:52:29 NATTHEW, MARLATT ([id number]) -------------------------------------------------------------------------------- ROS/PFSH Details Patient  Name: GAMBLE, ENDERLE Date of Service: 08/31/2017 10:15 AM Medical Record Number: [id number] Patient Account Number: [id number] Date of Birth/Sex: 11-Aug-1945 (72 y.o. M) Treating RN: Montey Hora Primary Care Provider: Thomes Lolling Other Clinician: Referring Provider: Thomes Lolling Treating Provider/Extender: Melburn Hake, Travis Mastel Weeks in Treatment: 60 Information Obtained From Patient Wound History Do you currently have one or more open woundso Yes How many open wounds do you currently haveo 2 Approximately how long have you had your woundso more than 1 year How have you been treating your wound(s) until nowo open to air Has your wound(s) ever healed and then re-openedo No Have you had any lab work done in the past montho No Have you tested positive for an antibiotic resistant organism (MRSA, VRE)o No Have you tested positive for osteomyelitis (bone infection)o No Have you had any tests for circulation on your legso No Constitutional Symptoms (General Health) Complaints and Symptoms: Negative for: Fever; Chills Eyes Medical History: Positive for: Cataracts - removed Hematologic/Lymphatic Medical History:  Negative for: Anemia; Hemophilia; Human Immunodeficiency Virus; Lymphedema; Sickle Cell Disease Respiratory Complaints and Symptoms: No Complaints or Symptoms Medical History: Positive for: Chronic Obstructive Pulmonary Disease (COPD) Negative for: Aspiration; Asthma; Pneumothorax; Sleep Apnea; Tuberculosis Cardiovascular Complaints and Symptoms: No Complaints or Symptoms Medical History: Positive for: Hypertension Negative for: Angina; Arrhythmia; Congestive Heart Failure; Coronary Artery Disease; Deep Vein Thrombosis; Hypotension; Myocardial Infarction; Peripheral Arterial Disease; Peripheral Venous Disease; Phlebitis; Vasculitis Gastrointestinal BERNABE, DORCE ([id number]) Medical History: Negative for: Cirrhosis ; Colitis; Crohnos; Hepatitis A; Hepatitis B; Hepatitis  C Endocrine Medical History: Positive for: Type II Diabetes Treated with: Oral agents Blood sugar tested every day: No Genitourinary Medical History: Negative for: End Stage Renal Disease Immunological Medical History: Negative for: Lupus Erythematosus; Raynaudos; Scleroderma Integumentary (Skin) Medical History: Negative for: History of Burn; History of pressure wounds Musculoskeletal Medical History: Positive for: Osteoarthritis Negative for: Gout; Rheumatoid Arthritis; Osteomyelitis Neurologic Medical History: Positive for: Neuropathy Negative for: Dementia; Quadriplegia; Paraplegia; Seizure Disorder Oncologic Medical History: Negative for: Received Chemotherapy; Received Radiation Past Medical History Notes: 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 Psychiatric Complaints and Symptoms: No Complaints or Symptoms Medical History: Negative for: Anorexia/bulimia; Confinement Anxiety HBO Extended History Items Eyes: Cataracts Immunizations Pneumococcal Vaccine: BALDWIN, RACICOT ([id number]) Received Pneumococcal Vaccination: Yes Immunization Notes: up to date Implantable Devices Family and Social History Former smoker - chew tobacco; Marital Status - Widowed; Alcohol Use: Never; Drug Use: No History; Caffeine Use: Daily; Financial Concerns: No; Food, Clothing or Shelter Needs: No; Support System Lacking: No; Transportation Concerns: No; Advanced Directives: No; Patient does not want information on Advanced Directives Physician Affirmation I have reviewed and agree with the above information. Electronic Signature(s) Signed: 09/03/2017 12:55:36 AM By: Worthy Keeler PA-C Signed: 09/03/2017 4:53:29 PM By: Montey Hora Entered By: Worthy Keeler on 09/03/2017 00:51:39 [name] ([id number]) -------------------------------------------------------------------------------- SuperBill Details Patient Name: JOHNRYAN, SAO Date of Service: 08/31/2017 Medical  Record Number: [id number] Patient Account Number: [id number] Date of Birth/Sex: 1945/12/21 (72 y.o. M) Treating RN: Montey Hora Primary Care Provider: Thomes Lolling Other Clinician: Referring Provider: Thomes Lolling Treating Provider/Extender: Melburn Hake, Jenean Escandon Weeks in Treatment: 60 Diagnosis Coding ICD-10 Codes Code Description E11.622 Type 2 diabetes mellitus with other skin ulcer Unspecified open wound of abdominal wall, epigastric region without penetration into peritoneal cavity, S31.102S sequela F17.228 Nicotine dependence, chewing tobacco, with other nicotine-induced disorders Facility Procedures CPT4 Code: 38882800 Description: 34917 - WOUND CARE VISIT-LEV 2 EST PT Modifier: Quantity: 1 Physician Procedures CPT4: Description Modifier Quantity Code [id number] 99214 - WC PHYS LEVEL 4 - EST PT 1 ICD-10 Diagnosis Description E11.622 Type 2 diabetes mellitus with other skin ulcer S31.102S Unspecified open wound of abdominal wall, epigastric region without  penetration into peritoneal cavity, sequela F17.228 Nicotine dependence, chewing tobacco, with other nicotine-induced disorders Electronic Signature(s) Signed: 09/03/2017 11:50:23 PM By: Worthy Keeler PA-C Entered By: Worthy Keeler on 09/03/2017 23:28:55

## 2017-09-06 NOTE — Progress Notes (Signed)
KEYWON, MESTRE ([id number]) Visit Report for 08/31/2017 Arrival Information Details Patient Name: [name], [name]. Date of Service: 08/31/2017 10:15 AM Medical Record Number: [id number] Patient Account Number: [id number] Date of Birth/Sex: [birth year] (72 y.o. M) Treating RN: Ahmed Prima Primary Care Latiqua Daloia: Thomes Lolling Other Clinician: Referring Ronni Osterberg: Thomes Lolling Treating Mitch Arquette/Extender: Sharalyn Ink in Treatment: 83 Visit Information History Since Last Visit All ordered tests and consults were completed: No Patient Arrived: Ambulatory Added or deleted any medications: No Arrival Time: 10:14 Any new allergies or adverse reactions: No Accompanied By: daughter Had a fall or experienced change in No Transfer Assistance: None activities of daily living that may affect Patient Identification Verified: Yes risk of falls: Secondary Verification Process Completed: Yes Signs or symptoms of abuse/neglect since last visito No Patient Requires Transmission-Based No Hospitalized since last visit: No Precautions: Implantable device outside of the clinic excluding No Patient Has Alerts: Yes cellular tissue based products placed in the center since last visit: Has Dressing in Place as Prescribed: Yes Pain Present Now: No Electronic Signature(s) Signed: 09/05/2017 4:02:05 PM By: Alric Quan Entered By: Alric Quan on 08/31/2017 10:14:57 [name] ([id number]) -------------------------------------------------------------------------------- Clinic Level of Care Assessment Details Patient Name: [name] Date of Service: 08/31/2017 10:15 AM Medical Record Number: [id number] Patient Account Number: [id number] Date of Birth/Sex: 10/11/1945 (72 y.o. M) Treating RN: Montey Hora Primary Care Colan Laymon: Thomes Lolling Other Clinician: Referring Jerrie Schussler: Thomes Lolling Treating Apostolos Blagg/Extender: Melburn Hake, HOYT Weeks in Treatment: 60 Clinic Level of  Care Assessment Items TOOL 4 Quantity Score []  - Use when only an EandM is performed on FOLLOW-UP visit 0 ASSESSMENTS - Nursing Assessment / Reassessment X - Reassessment of Co-morbidities (includes updates in patient status) 1 10 X- 1 5 Reassessment of Adherence to Treatment Plan ASSESSMENTS - Wound and Skin Assessment / Reassessment X - Simple Wound Assessment / Reassessment - one wound 1 5 []  - 0 Complex Wound Assessment / Reassessment - multiple wounds []  - 0 Dermatologic / Skin Assessment (not related to wound area) ASSESSMENTS - Focused Assessment []  - Circumferential Edema Measurements - multi extremities 0 []  - 0 Nutritional Assessment / Counseling / Intervention []  - 0 Lower Extremity Assessment (monofilament, tuning fork, pulses) []  - 0 Peripheral Arterial Disease Assessment (using hand held doppler) ASSESSMENTS - Ostomy and/or Continence Assessment and Care []  - Incontinence Assessment and Management 0 []  - 0 Ostomy Care Assessment and Management (repouching, etc.) PROCESS - Coordination of Care X - Simple Patient / Family Education for ongoing care 1 15 []  - 0 Complex (extensive) Patient / Family Education for ongoing care []  - 0 Staff obtains Programmer, systems, Records, Test Results / Process Orders []  - 0 Staff telephones HHA, Nursing Homes / Clarify orders / etc []  - 0 Routine Transfer to another Facility (non-emergent condition) []  - 0 Routine Hospital Admission (non-emergent condition) []  - 0 New Admissions / Biomedical engineer / Ordering NPWT, Apligraf, etc. []  - 0 Emergency Hospital Admission (emergent condition) X- 1 10 Simple Discharge Coordination [name], [name] ([id number]) []  - 0 Complex (extensive) Discharge Coordination PROCESS - Special Needs []  - Pediatric / Minor Patient Management 0 []  - 0 Isolation Patient Management []  - 0 Hearing / Language / Visual special needs []  - 0 Assessment of Community assistance (transportation, D/C planning,  etc.) []  - 0 Additional assistance / Altered mentation []  - 0 Support Surface(s) Assessment (bed, cushion, seat, etc.) INTERVENTIONS - Wound Cleansing / Measurement X - Simple Wound Cleansing - one  wound 1 5 []  - 0 Complex Wound Cleansing - multiple wounds X- 1 5 Wound Imaging (photographs - any number of wounds) []  - 0 Wound Tracing (instead of photographs) X- 1 5 Simple Wound Measurement - one wound []  - 0 Complex Wound Measurement - multiple wounds INTERVENTIONS - Wound Dressings X - Small Wound Dressing one or multiple wounds 1 10 []  - 0 Medium Wound Dressing one or multiple wounds []  - 0 Large Wound Dressing one or multiple wounds []  - 0 Application of Medications - topical []  - 0 Application of Medications - injection INTERVENTIONS - Miscellaneous []  - External ear exam 0 []  - 0 Specimen Collection (cultures, biopsies, blood, body fluids, etc.) []  - 0 Specimen(s) / Culture(s) sent or taken to Lab for analysis []  - 0 Patient Transfer (multiple staff / Civil Service fast streamer / Similar devices) []  - 0 Simple Staple / Suture removal (25 or less) []  - 0 Complex Staple / Suture removal (26 or more) []  - 0 Hypo / Hyperglycemic Management (close monitor of Blood Glucose) []  - 0 Ankle / Brachial Index (ABI) - do not check if billed separately X- 1 5 Vital Signs [name], [name]. ([id number]) Has the patient been seen at the hospital within the last three years: Yes Total Score: 75 Level Of Care: New/Established - Level 2 Electronic Signature(s) Signed: 08/31/2017 4:49:47 PM By: Montey Hora Entered By: Montey Hora on 08/31/2017 10:55:16 [name] ([id number]) -------------------------------------------------------------------------------- Encounter Discharge Information Details Patient Name: [name] Date of Service: 08/31/2017 10:15 AM Medical Record Number: [id number] Patient Account Number: [id number] Date of Birth/Sex: 1945-10-04 (72 y.o. M) Treating RN:  Ahmed Prima Primary Care Deitrich Steve: Thomes Lolling Other Clinician: Referring Giorgia Wahler: Thomes Lolling Treating Priscila Bean/Extender: Sharalyn Ink in Treatment: 12 Encounter Discharge Information Items Discharge Condition: Stable Ambulatory Status: Ambulatory Discharge Destination: Home Transportation: Private Auto Accompanied By: daughter Schedule Follow-up Appointment: Yes Clinical Summary of Care: Electronic Signature(s) Signed: 08/31/2017 10:40:19 AM By: Alric Quan Entered By: Alric Quan on 08/31/2017 10:40:19 [name] ([id number]) -------------------------------------------------------------------------------- Lower Extremity Assessment Details Patient Name: [name] Date of Service: 08/31/2017 10:15 AM Medical Record Number: [id number] Patient Account Number: [id number] Date of Birth/Sex: 11-Jul-1945 (72 y.o. M) Treating RN: Ahmed Prima Primary Care Shiya Fogelman: Thomes Lolling Other Clinician: Referring Ajani Rineer: Thomes Lolling Treating Roshan Salamon/Extender: Melburn Hake, HOYT Weeks in Treatment: 60 Electronic Signature(s) Signed: 09/05/2017 4:02:05 PM By: Alric Quan Entered By: Alric Quan on 08/31/2017 10:17:03 [name] ([id number]) -------------------------------------------------------------------------------- Multi Wound Chart Details Patient Name: [name] Date of Service: 08/31/2017 10:15 AM Medical Record Number: [id number] Patient Account Number: [id number] Date of Birth/Sex: 04-12-1945 (72 y.o. M) Treating RN: Montey Hora Primary Care Nissi Doffing: Thomes Lolling Other Clinician: Referring Harolyn Cocker: Thomes Lolling Treating Rawley Harju/Extender: Melburn Hake, HOYT Weeks in Treatment: 60 Vital Signs Height(in): 65 Pulse(bpm): 72 Weight(lbs): 161 Blood Pressure(mmHg): 140/73 Body Mass Index(BMI): 27 Temperature(F): 97.7 Respiratory Rate 18 (breaths/min): Photos: [2:No Photos] [N/A:N/A] Wound Location: [2:Left  Abdomen - midline] [N/A:N/A] Wounding Event: [2:Gradually Appeared] [N/A:N/A] Primary Etiology: [2:Atypical] [N/A:N/A] Comorbid History: [2:Cataracts, Chronic Obstructive N/A Pulmonary Disease (COPD), Hypertension, Type II Diabetes, Osteoarthritis, Neuropathy] Date Acquired: [2:05/17/2015] [N/A:N/A] Weeks of Treatment: [2:60] [N/A:N/A] Wound Status: [2:Open] [N/A:N/A] Clustered Wound: [2:Yes] [N/A:N/A] Clustered Quantity: [2:1] [N/A:N/A] Measurements L x W x D [2:4.4x0.3x0.2] [N/A:N/A] (cm) Area (cm) : [2:1.037] [N/A:N/A] Volume (cm) : [2:0.207] [N/A:N/A] % Reduction in Area: [2:80.40%] [N/A:N/A] % Reduction in Volume: [2:60.80%] [N/A:N/A] Classification: [2:Full Thickness Without Exposed Support Structures] [N/A:N/A] Exudate Amount: [  2:Large] [N/A:N/A] Exudate Type: [2:Serous] [N/A:N/A] Exudate Color: [2:amber] [N/A:N/A] Wound Margin: [2:Flat and Intact] [N/A:N/A] Granulation Amount: [2:Large (67-100%)] [N/A:N/A] Granulation Quality: [2:Red] [N/A:N/A] Necrotic Amount: [2:None Present (0%)] [N/A:N/A] Exposed Structures: [2:Fascia: No Fat Layer (Subcutaneous Tissue) Exposed: No Tendon: No Muscle: No Joint: No Bone: No Limited to Skin Breakdown] [N/A:N/A] Epithelialization: Large (67-100%) N/A N/A Periwound Skin Texture: Induration: Yes N/A N/A Scarring: Yes Excoriation: No Callus: No Crepitus: No Rash: No Periwound Skin Moisture: Maceration: No N/A N/A Dry/Scaly: No Periwound Skin Color: Atrophie Blanche: No N/A N/A Cyanosis: No Ecchymosis: No Erythema: No Hemosiderin Staining: No Mottled: No Pallor: No Rubor: No Temperature: No Abnormality N/A N/A Tenderness on Palpation: No N/A N/A Wound Preparation: Ulcer Cleansing: N/A N/A Rinsed/Irrigated with Saline Topical Anesthetic Applied: Other: lidocaine 4% Treatment Notes Electronic Signature(s) Signed: 08/31/2017 4:49:47 PM By: Montey Hora Entered By: Montey Hora on 08/31/2017 10:26:25 [name]  ([id number]) -------------------------------------------------------------------------------- [place] Plan Details Patient Name: [name], [name] Date of Service: 08/31/2017 10:15 AM Medical Record Number: [id number] Patient Account Number: [id number] Date of Birth/Sex: 01-19-1946 (72 y.o. M) Treating RN: Montey Hora Primary Care Cerria Randhawa: Thomes Lolling Other Clinician: Referring Van [place]: Thomes Lolling Treating Kameron Glazebrook/Extender: Melburn Hake, HOYT Weeks in Treatment: 60 Active Inactive ` Abuse / Safety / Falls / Self Care Management Nursing Diagnoses: Impaired physical mobility Goals: Patient will remain injury free Date Initiated: 07/03/2016 Target Resolution Date: 09/14/2016 Goal Status: Active Interventions: Assess fall risk on admission and as needed Notes: ` Orientation to the Wound Care Program Nursing Diagnoses: Knowledge deficit related to the wound healing center program Goals: Patient/caregiver will verbalize understanding of the [place] Date Initiated: 07/03/2016 Target Resolution Date: 09/15/2016 Goal Status: Active Interventions: Provide education on orientation to the wound center Notes: ` Wound/Skin Impairment Nursing Diagnoses: Impaired tissue integrity Goals: Patient/caregiver will verbalize understanding of skin care regimen Date Initiated: 07/03/2016 Target Resolution Date: 09/15/2016 Goal Status: Active Ulcer/skin breakdown will have a volume reduction of 30% by week 4 Date Initiated: 07/03/2016 Target Resolution Date: 09/15/2016 [name], [name] ([id number]) Goal Status: Active Ulcer/skin breakdown will have a volume reduction of 50% by week 8 Date Initiated: 07/03/2016 Target Resolution Date: 09/15/2016 Goal Status: Active Ulcer/skin breakdown will have a volume reduction of 80% by week 12 Date Initiated: 07/03/2016 Target Resolution Date: 09/15/2016 Goal Status: Active Ulcer/skin breakdown will heal within 14 weeks Date  Initiated: 07/03/2016 Target Resolution Date: 09/15/2016 Goal Status: Active Interventions: Assess patient/caregiver ability to obtain necessary supplies Assess patient/caregiver ability to perform ulcer/skin care regimen upon admission and as needed Assess ulceration(s) every visit Notes: Electronic Signature(s) Signed: 08/31/2017 4:49:47 PM By: Montey Hora Entered By: Montey Hora on 08/31/2017 10:26:14 [name] ([id number]) -------------------------------------------------------------------------------- Pain Assessment Details Patient Name: [name] Date of Service: 08/31/2017 10:15 AM Medical Record Number: [id number] Patient Account Number: [id number] Date of Birth/Sex: 11-18-1945 (72 y.o. M) Treating RN: Ahmed Prima Primary Care Mallisa Alameda: Thomes Lolling Other Clinician: Referring Kiowa Hollar: Thomes Lolling Treating Terence Googe/Extender: Melburn Hake, HOYT Weeks in Treatment: 60 Active Problems Location of Pain Severity and Description of Pain Patient Has Paino No Site Locations Pain Management and Medication Current Pain Management: Electronic Signature(s) Signed: 09/05/2017 4:02:05 PM By: Alric Quan Entered By: Alric Quan on 08/31/2017 10:15:03 [name] ([id number]) -------------------------------------------------------------------------------- Patient/Caregiver Education Details Patient Name: [name] Date of Service: 08/31/2017 10:15 AM Medical Record Number: [id number] Patient Account Number: [id number] Date of Birth/Gender: 1945/06/18 (72 y.o. M) Treating RN: Ahmed Prima Primary Care Physician: Gwenlyn Saran,  MARCO Other Clinician: Referring Physician: Thomes Lolling Treating Physician/Extender: Sharalyn Ink in Treatment: 42 Education Assessment Education Provided To: Patient Education Topics Provided Wound/Skin Impairment: Handouts: Caring for Your Ulcer, Skin Care Do's and Dont's, Other: change dressing as  ordered Methods: Demonstration, Explain/Verbal Responses: State content correctly Electronic Signature(s) Signed: 09/05/2017 4:02:05 PM By: Alric Quan Entered By: Alric Quan on 08/31/2017 10:40:51 [name] ([id number]) -------------------------------------------------------------------------------- Wound Assessment Details Patient Name: [name] Date of Service: 08/31/2017 10:15 AM Medical Record Number: [id number] Patient Account Number: [id number] Date of Birth/Sex: 06/11/1945 (73 y.o. M) Treating RN: Ahmed Prima Primary Care Seville Downs: Thomes Lolling Other Clinician: Referring Kiondre Grenz: Thomes Lolling Treating Natash Berman/Extender: Melburn Hake, HOYT Weeks in Treatment: 60 Wound Status Wound Number: 2 Primary Atypical Etiology: Wound Location: Left Abdomen - midline Wound Open Wounding Event: Gradually Appeared Status: Date Acquired: 05/17/2015 Comorbid Cataracts, Chronic Obstructive Pulmonary Weeks Of Treatment: 60 History: Disease (COPD), Hypertension, Type II Clustered Wound: Yes Diabetes, Osteoarthritis, Neuropathy Photos Photo Uploaded By: Montey Hora on 08/31/2017 14:25:54 Wound Measurements Length: (cm) 4.4 Width: (cm) 0.3 Depth: (cm) 0.2 Clustered Quantity: 1 Area: (cm) 1.037 Volume: (cm) 0.207 % Reduction in Area: 80.4% % Reduction in Volume: 60.8% Epithelialization: Large (67-100%) Tunneling: No Undermining: No Wound Description Full Thickness Without Exposed Support Classification: Structures Wound Margin: Flat and Intact Exudate Large Amount: Exudate Type: Serous Exudate Color: amber Foul Odor After Cleansing: No Slough/Fibrino No Wound Bed Granulation Amount: Large (67-100%) Exposed Structure Granulation Quality: Red Fascia Exposed: No Necrotic Amount: None Present (0%) Fat Layer (Subcutaneous Tissue) Exposed: No Tendon Exposed: No Muscle Exposed: No Joint Exposed: No [name], LEDIN. ([id number]) Bone Exposed:  No Limited to Skin Breakdown Periwound Skin Texture Texture Color No Abnormalities Noted: No No Abnormalities Noted: No Callus: No Atrophie Blanche: No Crepitus: No Cyanosis: No Excoriation: No Ecchymosis: No Induration: Yes Erythema: No Rash: No Hemosiderin Staining: No Scarring: Yes Mottled: No Pallor: No Moisture Rubor: No No Abnormalities Noted: No Dry / Scaly: No Temperature / Pain Maceration: No Temperature: No Abnormality Wound Preparation Ulcer Cleansing: Rinsed/Irrigated with Saline Topical Anesthetic Applied: Other: lidocaine 4%, Treatment Notes Wound #2 (Left Abdomen - midline) 1. Cleansed with: Clean wound with Normal Saline 2. Anesthetic Topical Lidocaine 4% cream to wound bed prior to debridement 4. Dressing Applied: Prisma Ag 5. Secondary [place] Signature(s) Signed: 09/05/2017 4:02:05 PM By: Alric Quan Entered By: Alric Quan on 08/31/2017 10:21:16 [name] ([id number]) -------------------------------------------------------------------------------- [place] Details Patient Name: [name] Date of Service: 08/31/2017 10:15 AM Medical Record Number: [id number] Patient Account Number: [id number] Date of Birth/Sex: Feb 11, 1946 (72 y.o. M) Treating RN: Ahmed Prima Primary Care Jarel Cuadra: Thomes Lolling Other Clinician: Referring Katleen Carraway: Thomes Lolling Treating Oris Staffieri/Extender: Melburn Hake, HOYT Weeks in Treatment: 60 Vital Signs Time Taken: 10:15 Temperature (F): 97.7 Height (in): 65 Pulse (bpm): 72 Weight (lbs): 161 Respiratory Rate (breaths/min): 18 Body Mass Index (BMI): 26.8 Blood Pressure (mmHg): 140/73 Reference Range: 80 - 120 mg / dl Electronic Signature(s) Signed: 09/05/2017 4:02:05 PM By: Alric Quan Entered By: Alric Quan on 08/31/2017 10:16:52

## 2017-09-07 ENCOUNTER — Encounter: Payer: Medicare HMO | Admitting: Physician Assistant

## 2017-09-07 DIAGNOSIS — E11622 Type 2 diabetes mellitus with other skin ulcer: Secondary | ICD-10-CM | POA: Diagnosis not present

## 2017-09-09 NOTE — Progress Notes (Signed)
FUAD, FORGET ([id number]) Visit Report for 09/07/2017 Arrival Information Details Patient Name: [name], [name]. Date of Service: 09/07/2017 10:15 AM Medical Record Number: [id number] Patient Account Number: [id number] Date of Birth/Sex: 08-25-1945 (72 y.o. M) Treating RN: Cornell Barman Primary Care Bridger Pizzi: Thomes Lolling Other Clinician: Referring Omare Bilotta: Thomes Lolling Treating Armando Lauman/Extender: Sharalyn Ink in Treatment: 58 Visit Information History Since Last Visit Added or deleted any medications: No Patient Arrived: Ambulatory Any new allergies or adverse reactions: No Arrival Time: 10:14 Had a fall or experienced change in No Accompanied By: self activities of daily living that may affect Transfer Assistance: None risk of falls: Patient Identification Verified: Yes Signs or symptoms of abuse/neglect since last visito No Secondary Verification Process Completed: Yes Hospitalized since last visit: No Patient Requires Transmission-Based No Implantable device outside of the clinic excluding No Precautions: cellular tissue based products placed in the center Patient Has Alerts: Yes since last visit: Has Dressing in Place as Prescribed: Yes Pain Present Now: No Electronic Signature(s) Signed: 09/07/2017 4:55:51 PM By: Gretta Cool, BSN, RN, CWS, Kim RN, BSN Entered By: Gretta Cool, BSN, RN, CWS, Kim on 09/07/2017 10:14:31 [name] ([id number]) -------------------------------------------------------------------------------- Clinic Level of Care Assessment Details Patient Name: [name], [name]. Date of Service: 09/07/2017 10:15 AM Medical Record Number: [id number] Patient Account Number: [id number] Date of Birth/Sex: 1945/11/26 (72 y.o. M) Treating RN: Montey Hora Primary Care Harith Mccadden: Thomes Lolling Other Clinician: Referring Octavian Godek: Thomes Lolling Treating Rae Sutcliffe/Extender: Melburn Hake, HOYT Weeks in Treatment: 52 Clinic Level of Care Assessment Items TOOL 4 Quantity  Score []  - Use when only an EandM is performed on FOLLOW-UP visit 0 ASSESSMENTS - Nursing Assessment / Reassessment X - Reassessment of Co-morbidities (includes updates in patient status) 1 10 X- 1 5 Reassessment of Adherence to Treatment Plan ASSESSMENTS - Wound and Skin Assessment / Reassessment X - Simple Wound Assessment / Reassessment - one wound 1 5 []  - 0 Complex Wound Assessment / Reassessment - multiple wounds []  - 0 Dermatologic / Skin Assessment (not related to wound area) ASSESSMENTS - Focused Assessment []  - Circumferential Edema Measurements - multi extremities 0 []  - 0 Nutritional Assessment / Counseling / Intervention []  - 0 Lower Extremity Assessment (monofilament, tuning fork, pulses) []  - 0 Peripheral Arterial Disease Assessment (using hand held doppler) ASSESSMENTS - Ostomy and/or Continence Assessment and Care []  - Incontinence Assessment and Management 0 []  - 0 Ostomy Care Assessment and Management (repouching, etc.) PROCESS - Coordination of Care X - Simple Patient / Family Education for ongoing care 1 15 []  - 0 Complex (extensive) Patient / Family Education for ongoing care []  - 0 Staff obtains Programmer, systems, Records, Test Results / Process Orders []  - 0 Staff telephones HHA, Nursing Homes / Clarify orders / etc []  - 0 Routine Transfer to another Facility (non-emergent condition) []  - 0 Routine Hospital Admission (non-emergent condition) []  - 0 New Admissions / Biomedical engineer / Ordering NPWT, Apligraf, etc. []  - 0 Emergency Hospital Admission (emergent condition) X- 1 10 Simple Discharge Coordination [name], [name] ([id number]) []  - 0 Complex (extensive) Discharge Coordination PROCESS - Special Needs []  - Pediatric / Minor Patient Management 0 []  - 0 Isolation Patient Management []  - 0 Hearing / Language / Visual special needs []  - 0 Assessment of Community assistance (transportation, D/C planning, etc.) []  - 0 Additional assistance /  Altered mentation []  - 0 Support Surface(s) Assessment (bed, cushion, seat, etc.) INTERVENTIONS - Wound Cleansing / Measurement X - Simple Wound Cleansing - one  wound 1 5 []  - 0 Complex Wound Cleansing - multiple wounds X- 1 5 Wound Imaging (photographs - any number of wounds) []  - 0 Wound Tracing (instead of photographs) X- 1 5 Simple Wound Measurement - one wound []  - 0 Complex Wound Measurement - multiple wounds INTERVENTIONS - Wound Dressings X - Small Wound Dressing one or multiple wounds 1 10 []  - 0 Medium Wound Dressing one or multiple wounds []  - 0 Large Wound Dressing one or multiple wounds []  - 0 Application of Medications - topical []  - 0 Application of Medications - injection INTERVENTIONS - Miscellaneous []  - External ear exam 0 []  - 0 Specimen Collection (cultures, biopsies, blood, body fluids, etc.) []  - 0 Specimen(s) / Culture(s) sent or taken to Lab for analysis []  - 0 Patient Transfer (multiple staff / Civil Service fast streamer / Similar devices) []  - 0 Simple Staple / Suture removal (25 or less) []  - 0 Complex Staple / Suture removal (26 or more) []  - 0 Hypo / Hyperglycemic Management (close monitor of Blood Glucose) []  - 0 Ankle / Brachial Index (ABI) - do not check if billed separately X- 1 5 Vital Signs [name], CHO. ([id number]) Has the patient been seen at the hospital within the last three years: Yes Total Score: 75 Level Of Care: New/Established - Level 2 Electronic Signature(s) Signed: 09/07/2017 4:59:39 PM By: Montey Hora Entered By: Montey Hora on 09/07/2017 10:41:09 [name] ([id number]) -------------------------------------------------------------------------------- Encounter Discharge Information Details Patient Name: [name] Date of Service: 09/07/2017 10:15 AM Medical Record Number: [id number] Patient Account Number: [id number] Date of Birth/Sex: 1945/09/28 (72 y.o. M) Treating RN: Montey Hora Primary Care Lester Crickenberger:  Thomes Lolling Other Clinician: Referring Collin Hendley: Thomes Lolling Treating Cheikh Bramble/Extender: Sharalyn Ink in Treatment: 6 Encounter Discharge Information Items Discharge Condition: Stable Ambulatory Status: Ambulatory Discharge Destination: Home Transportation: Private Auto Accompanied By: self Schedule Follow-up Appointment: Yes Clinical Summary of Care: Electronic Signature(s) Signed: 09/07/2017 4:59:39 PM By: Montey Hora Entered By: Montey Hora on 09/07/2017 10:41:53 [name] ([id number]) -------------------------------------------------------------------------------- Lower Extremity Assessment Details Patient Name: [name] Date of Service: 09/07/2017 10:15 AM Medical Record Number: [id number] Patient Account Number: [id number] Date of Birth/Sex: 1945/06/22 (72 y.o. M) Treating RN: Cornell Barman Primary Care Tharon Bomar: Thomes Lolling Other Clinician: Referring Reeve Mallo: Thomes Lolling Treating Haydan Mansouri/Extender: Sharalyn Ink in Treatment: 78 Electronic Signature(s) Signed: 09/07/2017 4:55:51 PM By: Gretta Cool, BSN, RN, CWS, Kim RN, BSN Entered By: Gretta Cool, BSN, RN, CWS, Kim on 09/07/2017 10:17:45 [name] ([id number]) -------------------------------------------------------------------------------- Multi Wound Chart Details Patient Name: [name] Date of Service: 09/07/2017 10:15 AM Medical Record Number: [id number] Patient Account Number: [id number] Date of Birth/Sex: February 26, 1946 (72 y.o. M) Treating RN: Montey Hora Primary Care Nickolette Espinola: Thomes Lolling Other Clinician: Referring Ellionna Buckbee: Thomes Lolling Treating Esta Carmon/Extender: Melburn Hake, HOYT Weeks in Treatment: 5 Vital Signs Height(in): 65 Pulse(bpm): 40 Weight(lbs): 161 Blood Pressure(mmHg): 142/78 Body Mass Index(BMI): 27 Temperature(F): 97.7 Respiratory Rate 16 (breaths/min): Photos: [2:No Photos] [N/A:N/A] Wound Location: [2:Left Abdomen - midline]  [N/A:N/A] Wounding Event: [2:Gradually Appeared] [N/A:N/A] Primary Etiology: [2:Atypical] [N/A:N/A] Comorbid History: [2:Cataracts, Chronic Obstructive N/A Pulmonary Disease (COPD), Hypertension, Type II Diabetes, Osteoarthritis, Neuropathy] Date Acquired: [2:05/17/2015] [N/A:N/A] Weeks of Treatment: [2:61] [N/A:N/A] Wound Status: [2:Open] [N/A:N/A] Clustered Wound: [2:Yes] [N/A:N/A] Clustered Quantity: [2:1] [N/A:N/A] Measurements L x W x D [2:0.1x0.1x0.1] [N/A:N/A] (cm) Area (cm) : [2:0.008] [N/A:N/A] Volume (cm) : [2:0.001] [N/A:N/A] % Reduction in Area: [2:99.80%] [N/A:N/A] % Reduction in Volume: [2:99.80%] [N/A:N/A] Classification: [2:Full  Thickness Without Exposed Support Structures] [N/A:N/A] Exudate Amount: [2:Small] [N/A:N/A] Exudate Type: [2:Serous] [N/A:N/A] Exudate Color: [2:amber] [N/A:N/A] Wound Margin: [2:Flat and Intact] [N/A:N/A] Granulation Amount: [2:Large (67-100%)] [N/A:N/A] Granulation Quality: [2:Red] [N/A:N/A] Necrotic Amount: [2:None Present (0%)] [N/A:N/A] Exposed Structures: [2:Fascia: No Fat Layer (Subcutaneous Tissue) Exposed: No Tendon: No Muscle: No Joint: No Bone: No Limited to Skin Breakdown] [N/A:N/A] Epithelialization: Large (67-100%) N/A N/A Periwound Skin Texture: Induration: Yes N/A N/A Scarring: Yes Excoriation: No Callus: No Crepitus: No Rash: No Periwound Skin Moisture: Maceration: No N/A N/A Dry/Scaly: No Periwound Skin Color: Atrophie Blanche: No N/A N/A Cyanosis: No Ecchymosis: No Erythema: No Hemosiderin Staining: No Mottled: No Pallor: No Rubor: No Temperature: No Abnormality N/A N/A Tenderness on Palpation: No N/A N/A Wound Preparation: Ulcer Cleansing: N/A N/A Rinsed/Irrigated with Saline Topical Anesthetic Applied: None Treatment Notes Electronic Signature(s) Signed: 09/07/2017 4:59:39 PM By: Montey Hora Entered By: Montey Hora on 09/07/2017 10:39:13 [name]  ([id number]) -------------------------------------------------------------------------------- Multi-Disciplinary Care Plan Details Patient Name: [name] Date of Service: 09/07/2017 10:15 AM Medical Record Number: [id number] Patient Account Number: [id number] Date of Birth/Sex: 1945/03/21 (72 y.o. M) Treating RN: Montey Hora Primary Care Dequante Tremaine: Thomes Lolling Other Clinician: Referring Lavinia Mcneely: Thomes Lolling Treating Taresa [place]/Extender: Melburn Hake, HOYT Weeks in Treatment: 26 Active Inactive ` Abuse / Safety / Falls / Self Care Management Nursing Diagnoses: Impaired physical mobility Goals: Patient will remain injury free Date Initiated: 07/03/2016 Target Resolution Date: 09/14/2016 Goal Status: Active Interventions: Assess fall risk on admission and as needed Notes: ` Orientation to the Wound Care Program Nursing Diagnoses: Knowledge deficit related to the wound healing center program Goals: Patient/caregiver will verbalize understanding of the [place] Date Initiated: 07/03/2016 Target Resolution Date: 09/15/2016 Goal Status: Active Interventions: Provide education on orientation to the wound center Notes: ` Wound/Skin Impairment Nursing Diagnoses: Impaired tissue integrity Goals: Patient/caregiver will verbalize understanding of skin care regimen Date Initiated: 07/03/2016 Target Resolution Date: 09/15/2016 Goal Status: Active Ulcer/skin breakdown will have a volume reduction of 30% by week 4 Date Initiated: 07/03/2016 Target Resolution Date: 09/15/2016 [name], [name] ([id number]) Goal Status: Active Ulcer/skin breakdown will have a volume reduction of 50% by week 8 Date Initiated: 07/03/2016 Target Resolution Date: 09/15/2016 Goal Status: Active Ulcer/skin breakdown will have a volume reduction of 80% by week 12 Date Initiated: 07/03/2016 Target Resolution Date: 09/15/2016 Goal Status: Active Ulcer/skin breakdown will heal within 14 weeks Date  Initiated: 07/03/2016 Target Resolution Date: 09/15/2016 Goal Status: Active Interventions: Assess patient/caregiver ability to obtain necessary supplies Assess patient/caregiver ability to perform ulcer/skin care regimen upon admission and as needed Assess ulceration(s) every visit Notes: Electronic Signature(s) Signed: 09/07/2017 4:59:39 PM By: Montey Hora Entered By: Montey Hora on 09/07/2017 10:39:00 [name] ([id number]) -------------------------------------------------------------------------------- Pain Assessment Details Patient Name: [name] Date of Service: 09/07/2017 10:15 AM Medical Record Number: [id number] Patient Account Number: [id number] Date of Birth/Sex: 1945/09/14 (72 y.o. M) Treating RN: Cornell Barman Primary Care Raschelle Wisenbaker: Thomes Lolling Other Clinician: Referring Seini Lannom: Thomes Lolling Treating Bracha Frankowski/Extender: Sharalyn Ink in Treatment: 53 Active Problems Location of Pain Severity and Description of Pain Patient Has Paino No Site Locations With Dressing Change: No Pain Management and Medication Current Pain Management: Electronic Signature(s) Signed: 09/07/2017 4:55:51 PM By: Gretta Cool, BSN, RN, CWS, Kim RN, BSN Entered By: Gretta Cool, BSN, RN, CWS, Kim on 09/07/2017 10:14:38 [name] ([id number]) -------------------------------------------------------------------------------- Patient/Caregiver Education Details Patient Name: [name] Date of Service: 09/07/2017 10:15 AM Medical Record Number: [id number] Patient  Account Number: [id number] Date of Birth/Gender: 12/01/1945 (72 y.o. M) Treating RN: Montey Hora Primary Care Physician: Thomes Lolling Other Clinician: Referring Physician: Thomes Lolling Treating Physician/Extender: Sharalyn Ink in Treatment: 51 Education Assessment Education Provided To: Patient Education Topics Provided Wound/Skin Impairment: Handouts: Other: wound care as ordered Methods:  Demonstration, Explain/Verbal Responses: State content correctly Electronic Signature(s) Signed: 09/07/2017 4:59:39 PM By: Montey Hora Entered By: Montey Hora on 09/07/2017 10:42:12 [name] ([id number]) -------------------------------------------------------------------------------- Wound Assessment Details Patient Name: [name] Date of Service: 09/07/2017 10:15 AM Medical Record Number: [id number] Patient Account Number: [id number] Date of Birth/Sex: October 31, 1945 (72 y.o. M) Treating RN: Cornell Barman Primary Care Toby Breithaupt: Thomes Lolling Other Clinician: Referring Robbie Rideaux: Thomes Lolling Treating Annet Manukyan/Extender: Melburn Hake, HOYT Weeks in Treatment: 61 Wound Status Wound Number: 2 Primary Atypical Etiology: Wound Location: Left Abdomen - midline Wound Open Wounding Event: Gradually Appeared Status: Date Acquired: 05/17/2015 Comorbid Cataracts, Chronic Obstructive Pulmonary Weeks Of Treatment: 61 History: Disease (COPD), Hypertension, Type II Clustered Wound: Yes Diabetes, Osteoarthritis, Neuropathy Photos Photo Uploaded By: Gretta Cool, BSN, RN, CWS, Kim on 09/07/2017 13:15:28 Wound Measurements Length: (cm) 0.1 % Reduct Width: (cm) 0.1 % Reduct Depth: (cm) 0.1 Epitheli Clustered Quantity: 1 Tunnelin Area: (cm) 0.008 Undermi Volume: (cm) 0.001 ion in Area: 99.8% ion in Volume: 99.8% alization: Large (67-100%) g: No ning: No Wound Description Full Thickness Without Exposed Support Foul Od Classification: Structures Slough/ Wound Margin: Flat and Intact Exudate Small Amount: Exudate Type: Serous Exudate Color: amber or After Cleansing: No Fibrino No Wound Bed Granulation Amount: Large (67-100%) Exposed Structure Granulation Quality: Red Fascia Exposed: No Necrotic Amount: None Present (0%) Fat Layer (Subcutaneous Tissue) Exposed: No Tendon Exposed: No Muscle Exposed: No Joint Exposed: No [name], WINTLE. ([id number]) Bone Exposed: No Limited  to Skin Breakdown Periwound Skin Texture Texture Color No Abnormalities Noted: No No Abnormalities Noted: No Callus: No Atrophie Blanche: No Crepitus: No Cyanosis: No Excoriation: No Ecchymosis: No Induration: Yes Erythema: No Rash: No Hemosiderin Staining: No Scarring: Yes Mottled: No Pallor: No Moisture Rubor: No No Abnormalities Noted: No Dry / Scaly: No Temperature / Pain Maceration: No Temperature: No Abnormality Wound Preparation Ulcer Cleansing: Rinsed/Irrigated with Saline Topical Anesthetic Applied: None Treatment Notes Wound #2 (Left Abdomen - midline) 1. Cleansed with: Clean wound with Normal Saline 5. Secondary Arabi Signature(s) Signed: 09/07/2017 4:55:51 PM By: Gretta Cool, BSN, RN, CWS, Kim RN, BSN Entered By: Gretta Cool, BSN, RN, CWS, Kim on 09/07/2017 10:17:33 [name], [name] ([id number]) -------------------------------------------------------------------------------- Vitals Details Patient Name: [name], [name] Date of Service: 09/07/2017 10:15 AM Medical Record Number: [id number] Patient Account Number: [id number] Date of Birth/Sex: 03-09-1946 (72 y.o. M) Treating RN: Cornell Barman Primary Care Meril Dray: Thomes Lolling Other Clinician: Referring Mayan Kloepfer: Thomes Lolling Treating Baltazar Pekala/Extender: Melburn Hake, HOYT Weeks in Treatment: 76 Vital Signs Time Taken: 10:14 Temperature (F): 97.7 Height (in): 65 Pulse (bpm): 68 Weight (lbs): 161 Respiratory Rate (breaths/min): 16 Body Mass Index (BMI): 26.8 Blood Pressure (mmHg): 142/78 Reference Range: 80 - 120 mg / dl Electronic Signature(s) Signed: 09/07/2017 4:55:51 PM By: Gretta Cool, BSN, RN, CWS, Kim RN, BSN Entered By: Gretta Cool, BSN, RN, CWS, Kim on 09/07/2017 10:15:19

## 2017-09-10 ENCOUNTER — Encounter: Admit: 2017-09-10 | Discharge: 2017-09-11 | Payer: MEDICARE | Attending: Internal Medicine | Primary: Internal Medicine

## 2017-09-10 DIAGNOSIS — M545 Low back pain: Principal | ICD-10-CM

## 2017-09-10 DIAGNOSIS — G8929 Other chronic pain: Secondary | ICD-10-CM

## 2017-09-10 DIAGNOSIS — I10 Essential (primary) hypertension: Secondary | ICD-10-CM

## 2017-09-10 DIAGNOSIS — K219 Gastro-esophageal reflux disease without esophagitis: Secondary | ICD-10-CM

## 2017-09-10 DIAGNOSIS — M5090 Cervical disc disorder, unspecified, unspecified cervical region: Secondary | ICD-10-CM

## 2017-09-10 MED ORDER — OMEPRAZOLE 20 MG CAPSULE,DELAYED RELEASE
ORAL_CAPSULE | Freq: Every day | ORAL | 3 refills | 0 days | Status: CP
Start: 2017-09-10 — End: 2018-09-03

## 2017-09-11 NOTE — Progress Notes (Signed)
[name] ([id number]) Visit Report for 09/07/2017 Chief Complaint Document Details Patient Name: [name]. Date of Service: 09/07/2017 10:15 AM Medical Record Number: [id number] Patient Account Number: [id number] Date of Birth/Sex: [birth year] (72 y.o. M) Treating RN: Montey Hora Primary Care Provider: Thomes Lolling Other Clinician: Referring Provider: Thomes Lolling Treating Provider/Extender: Sharalyn Ink in Treatment: 30 Information Obtained from: Patient Chief Complaint anterior abdominal wall in the epigastric region Electronic Signature(s) Signed: 09/08/2017 12:34:34 PM By: Worthy Keeler PA-C Entered By: Worthy Keeler on 09/07/2017 10:18:22 [name] ([id number]) -------------------------------------------------------------------------------- HPI Details Patient Name: [name] Date of Service: 09/07/2017 10:15 AM Medical Record Number: [id number] Patient Account Number: [id number] Date of Birth/Sex: [birth year] (72 y.o. M) Treating RN: Montey Hora Primary Care Provider: Thomes Lolling Other Clinician: Referring Provider: Thomes Lolling Treating Provider/Extender: Melburn Hake, Ahmet Schank Weeks in Treatment: 66 History of Present Illness HPI Description: 72 year old gentleman has been seen recently by his PCP Dr. Thomes Lolling, who sees him with a history of diabetes mellitus, peripheral neuropathy, B12 deficiency, hypertension, osteoarthritis and the patient had recent blood sugar checked which was 163. Past medical history includes essential hypertension, incisional hernia, diabetes mellitus, colon polyps, COPD, tobacco abuse in the past, chewing tobacco, GERD. He has also been treated for chronic back pain with no sciatica and is on oxycodone. His last hemoglobin A1c was 6.9% His abdominal surgery was done at [facility] over 15 years ago and gradually there was sutures which protruded and then there was mesh which protruded. He washes this during  his shower but does not put any dressing and the wound is covered with a lot of debris. He has never had a surgical consultation for this. 07/10/2016 -- the patient has spoken to his PCP who said he was going to call me but I have not yet had a phone conversation with the physician. From what I understand the physician was reluctant to have a surgical opinion because of the patient's several comorbidities and he feels that the surgery may be detrimental to his overall health 07/24/2016 -- the patient's daughter is at the bedside and we've had a detailed discussion regarding his options for a surgical opinion and possible surgical intervention before this becomes a emergent situation. She will seek primary care input and take the father to [facility] for a surgical opinion. 07/31/2016 -- the patient's daughter has organized for a surgical opinion at [facility] on June 14 08/14/2016 -- it has been about 3 weeks since he has stopped chewing tobacco. 08/28/2016 -- he was seen at [facility] by Dr. Roby Lofts -- the assessment was based on physical examination and review of his previous history of surgical repair. the patient had undergone a ventral hernia repair in 1998 with Dr. Quay Burow at [facility] at a prior open cholecystectomy incision. he was evaluated by Dr. Denice Paradise in 2013 and at that time it was decided to watch and wait. Dr. Payton Doughty recommended a repeat CT scan and requested records from dura and regional operative records and would follow-up the patient, after [name] PCP Dr. Gwenlyn Saran reviewed him for overall fitness for surgery. 09/04/16 patient's wound actually appear to be doing well on evaluation today. He is not really experience any discomfort and the right abdominal wound is dry and almost appears to be healing up. He continues to have some drainage from the left but this seems to be more of a potential seroma which with milking is able to  be cleared out. I discussed with  patient that when he performs his dressing changes at home it would be beneficial for him to do this as well. Otherwise there's no evidence of infection. He is still waiting on his CT scan and appointments to determine whether he is going to have another surgery. 09/11/2016 -- the patient has had a CT scan and is awaiting his surgical appointment prior to deciding on any surgical intervention. 09/25/16 patient presents today for fault evaluation concerning his ongoing abdominal surgical wounds. Unfortunately they do not appear to be improving significantly but rather fluctuate from a little better to little worse week by week. Obviously this is somewhat frustrating for him. 10/02/2016 -- the patient has completed his CT scan, and has a review by the surgeons at the end of August. He has a new area which has opened up a little below his left abdominal wound. 10/16/16 on evaluation today patient's wounds on the abdominal region appears to be doing about the same. He actually has an appointment with his surgeon on the 21st for evaluation regarding the required products appointment. Obviously he is having a difficult time with the feeling of these wounds. Fortunately there does not appear to be any evidence of local infection such as significant amount of erythema or streaking. She also has an audio, vomiting, or diarrhea. He is not have any discomfort at this point. [name], [name] ([id number]) 10/30/2016 -- the surgical review is tomorrow and I am awaiting the input of the surgeon. 11/16/2016 -- I had a call from his surgeon Dr. Payton Doughty, on phone number [phone] who was kind enough to call me regarding his care. After a thorough review and having consulted the abdominal wall reconstruction team they have decided that surgery will be too extensive and rather moribund for this gentleman and have asked him to continue with local care at the wound center. She will send me an official note via  Epic. 03/09/2017 -- the patient comes for some abdominal wall wounds which were caused by protruding mesh and complete surgical review was done at [facility] and no surgery has been recommended. He continues to get palliative care and the right abdominal wound is completely healed. He has a few open spots on the left which are being treated with palliative care and we see him once a month 05/04/17 on evaluation today Mr. Kaupp presents for follow-up concerning his abdominal surgical ulcer. He does have some additional mashed taking out of the wound at this point although he has made a lot of progress since last time I saw him. There is good epithelialization in a lot of areas although there is still mesh poking through which is preventing complete closure. Fortunately there does not appear to be any evidence of infection. 05/18/17 on evaluation today patient appears to be doing very well in regard to his Adamo ulcer. Of the two areas that were previously open one has completely close in the proximal portion of the wound and the remainder of the opening which is the inferior portion is significantly smaller and there does not appear to be a lot of significant issue with mesh poking through the skin currently. In fact this is a nice rather circular indention where he has granulation in the base. There was some of the mesh still showing through but very little. Overall he has made significant improvements in my opinion since even two weeks ago when I saw him. 06/22/17 on evaluation today patient's abdominal ulcer  on inspection appears to be doing much better. He still has 2 tiny openings at either end of the surgical site where he has scar tissue noted at this point. Fortunately there does not appear to be any evidence of infection which is good news. He does still have some evidence of the mesh showing which still seems to be preventing this from fully closing but fortunately this seems to be  minimal even compared to my last evaluation. Overall he has been doing very well. 07/06/17 on evaluation today patient's abdominal ulcer actually appears to be doing much better. He's showing signs of improvement little by little. Overall I feel this is appropriate as far as the progression that has been made. With that being said it's still been a very slow process. There's no evidence of infection. 07/20/17 on evaluation today patient appears to be doing rather well in regard to his left abdominal ulcer. He has been tolerating the dressing changes without complication fortunately there does not appear to be any evidence of infection. He still has two small openings although both of these appear to have filled in much more nicely compared to last week's evaluation. There is no obvious exposed mesh at this point. 08/03/17 on evaluation today the patient's wound on the abdominal region appears to be doing excellent. In fact the upper portion which was still open show signs of being completely epithelial eyes which is good news. He still does have a small opening on the inferior portion but overall things have progressed extremely well in my opinion. Overall I'm hoping that she will see this completely close over the next couple of weeks. 08/17/17-He is here for preparation for an abdominal wound, there are 2 areas without complete epithelialization; the proximal area is fluid-filled and hyper granular, treated with silver nitrate, the distal aspect has a thin crusted layer of epithelium. There is no erythema to indicate infection. We will change treatment plan and he will follow-up next week 08/24/17 on evaluation today patient actually appears to be doing very well in regard to his abdominal ulcer. Unfortunately the little area that had closed previous when I saw him actually reopened when Denny Peon saw him last week. Subsequently the lower portion seems to have almost completely resolved which is good news  in fact it may be closed even at this point. Nonetheless overall I'm very pleased with the progress that has been made up to this point. 08/31/17 on evaluation today patient's wound actually appears to be about the same as what I saw a previous. He does not seem to be showing any signs of worsening although there's no signs of necessarily improving eider. It does appear that the patient may actually have a little bit of cellulitis in the surrounding scar tissue region where the two small openings are at this point. I look back at pictures from prior weeks and it looks like that slowly this has gotten a little bit more erythematous and I've kind of been watching this but it's definite today compared to previous. For this reason I feel he made the at the point where antibiotic therapy would be beneficial for him. [name], [name] ([id number]) 09/07/17 on evaluation today patient appears to be doing very well. In fact he has just what may be a very small opening at this point in the abdominal region. Nonetheless I'm not even 100% sure this is a completely close. He's been tolerating the dressing changes without complication I do believe the antibiotic has been extremely  helpful for him. In fact I think that was the biggest issue he was having with why these areas had been closed up last week. Overall I'm very happy with the appearance today. Electronic Signature(s) Signed: 09/08/2017 12:34:34 PM By: Worthy Keeler PA-C Entered By: Worthy Keeler on 09/08/2017 11:27:11 [name], [name] ([id number]) -------------------------------------------------------------------------------- Physical Exam Details Patient Name: [name], [name] Date of Service: 09/07/2017 10:15 AM Medical Record Number: [id number] Patient Account Number: [id number] Date of Birth/Sex: 1946/02/06 (72 y.o. M) Treating RN: Montey Hora Primary Care Provider: Thomes Lolling Other Clinician: Referring Provider: Thomes Lolling Treating  Provider/Extender: Melburn Hake, Cornie Mccomber Weeks in Treatment: 24 Constitutional Well-nourished and well-hydrated in no acute distress. Respiratory normal breathing without difficulty. clear to auscultation bilaterally. Cardiovascular regular rate and rhythm with normal S1, S2. Psychiatric this patient is able to make decisions and demonstrates good insight into disease process. Alert and Oriented x 3. pleasant and cooperative. Notes Patient's wound bed shows signs of excellent improvement at this point as far as the opening as well as the infection is concerned. In general I think he is doing excellent in this is almost completely closed. I am extremely pleased. The patient likewise is very happy Electronic Signature(s) Signed: 09/08/2017 12:34:34 PM By: Worthy Keeler PA-C Entered By: Worthy Keeler on 09/08/2017 11:33:24 [name] ([id number]) -------------------------------------------------------------------------------- Physician Orders Details Patient Name: [name] Date of Service: 09/07/2017 10:15 AM Medical Record Number: [id number] Patient Account Number: [id number] Date of Birth/Sex: 10-14-1945 (72 y.o. M) Treating RN: Montey Hora Primary Care Provider: Thomes Lolling Other Clinician: Referring Provider: Thomes Lolling Treating Provider/Extender: Sharalyn Ink in Treatment: 13 Verbal / Phone Orders: No Diagnosis Coding ICD-10 Coding Code Description E11.622 Type 2 diabetes mellitus with other skin ulcer Unspecified open wound of abdominal wall, epigastric region without penetration into peritoneal cavity, S31.102S sequela F17.228 Nicotine dependence, chewing tobacco, with other nicotine-induced disorders Wound Cleansing Wound #2 Left Abdomen - midline o Clean wound with Normal Saline. o May Shower, gently pat wound dry prior to applying new dressing. Skin Barriers/Peri-Wound Care Wound #2 Left Abdomen - midline o Skin Prep Secondary  Dressing Wound #2 Left Abdomen - midline o Other - [place] Dressing Change Frequency Wound #2 Left Abdomen - midline o Change dressing every other day. Follow-up Appointments Wound #2 Left Abdomen - midline o Return Appointment in 3 weeks. - July 19th Electronic Signature(s) Signed: 09/07/2017 4:59:39 PM By: Montey Hora Signed: 09/08/2017 12:34:34 PM By: Worthy Keeler PA-C Entered By: Montey Hora on 09/07/2017 10:39:50 [name] ([id number]) -------------------------------------------------------------------------------- Problem List Details Patient Name: CODI, KERTZ Date of Service: 09/07/2017 10:15 AM Medical Record Number: [id number] Patient Account Number: [id number] Date of Birth/Sex: [birth year] (72 y.o. M) Treating RN: Montey Hora Primary Care Provider: Thomes Lolling Other Clinician: Referring Provider: Thomes Lolling Treating Provider/Extender: Sharalyn Ink in Treatment: 65 Active Problems ICD-10 Evaluated Encounter Code Description Active Date Today Diagnosis E11.622 Type 2 diabetes mellitus with other skin ulcer 07/03/2016 No Yes S31.102S Unspecified open wound of abdominal wall, epigastric region 07/03/2016 No Yes without penetration into peritoneal cavity, sequela F17.228 Nicotine dependence, chewing tobacco, with other nicotine- 07/03/2016 No Yes induced disorders Inactive Problems Resolved Problems ICD-10 Code Description Active Date Resolved Date S31.100A Unspecified open wound of abdominal wall, right upper quadrant 07/03/2016 07/03/2016 without penetration into peritoneal cavity, initial encounter Electronic Signature(s) Signed: 09/08/2017 12:34:34 PM By: Worthy Keeler PA-C Entered By: Worthy Keeler on 09/07/2017 10:18:04 Santino,  Waneta Martins ([id number]) -------------------------------------------------------------------------------- Progress Note Details Patient Name: [name], [name]. Date of Service: 09/07/2017 10:15  AM Medical Record Number: [id number] Patient Account Number: [id number] Date of Birth/Sex: 11-12-1945 (72 y.o. M) Treating RN: Montey Hora Primary Care Provider: Thomes Lolling Other Clinician: Referring Provider: Thomes Lolling Treating Provider/Extender: Sharalyn Ink in Treatment: 70 Subjective Chief Complaint Information obtained from Patient anterior abdominal wall in the epigastric region History of Present Illness (HPI) 72 year old gentleman has been seen recently by his PCP Dr. Thomes Lolling, who sees him with a history of diabetes mellitus, peripheral neuropathy, B12 deficiency, hypertension, osteoarthritis and the patient had recent blood sugar checked which was 163. Past medical history includes essential hypertension, incisional hernia, diabetes mellitus, colon polyps, COPD, tobacco abuse in the past, chewing tobacco, GERD. He has also been treated for chronic back pain with no sciatica and is on oxycodone. His last hemoglobin A1c was 6.9% His abdominal surgery was done at [facility] over 15 years ago and gradually there was sutures which protruded and then there was mesh which protruded. He washes this during his shower but does not put any dressing and the wound is covered with a lot of debris. He has never had a surgical consultation for this. 07/10/2016 -- the patient has spoken to his PCP who said he was going to call me but I have not yet had a phone conversation with the physician. From what I understand the physician was reluctant to have a surgical opinion because of the patient's several comorbidities and he feels that the surgery may be detrimental to his overall health 07/24/2016 -- the patient's daughter is at the bedside and we've had a detailed discussion regarding his options for a surgical opinion and possible surgical intervention before this becomes a emergent situation. She will seek primary care input and take the father to [facility] for a  surgical opinion. 07/31/2016 -- the patient's daughter has organized for a surgical opinion at [facility] on June 14 08/14/2016 -- it has been about 3 weeks since he has stopped chewing tobacco. 08/28/2016 -- he was seen at [facility] by Dr. Roby Lofts -- the assessment was based on physical examination and review of his previous history of surgical repair. the patient had undergone a ventral hernia repair in 1998 with Dr. Quay Burow at [facility] at a prior open cholecystectomy incision. he was evaluated by Dr. Denice Paradise in 2013 and at that time it was decided to watch and wait. Dr. Payton Doughty recommended a repeat CT scan and requested records from dura and regional operative records and would follow-up the patient, after Mr. Recore PCP Dr. Gwenlyn Saran reviewed him for overall fitness for surgery. 09/04/16 patient's wound actually appear to be doing well on evaluation today. He is not really experience any discomfort and the right abdominal wound is dry and almost appears to be healing up. He continues to have some drainage from the left but this seems to be more of a potential seroma which with milking is able to be cleared out. I discussed with patient that when he performs his dressing changes at home it would be beneficial for him to do this as well. Otherwise there's no evidence of infection. He is still waiting on his CT scan and appointments to determine whether he is going to have another surgery. 09/11/2016 -- the patient has had a CT scan and is awaiting his surgical appointment prior to deciding on any surgical intervention. 09/25/16 patient presents today for  fault evaluation concerning his ongoing abdominal surgical wounds. Unfortunately they do not appear to be improving significantly but rather fluctuate from a little better to little worse week by week. Obviously this is somewhat frustrating for him. 10/02/2016 -- the patient has completed his CT scan, and has a review by the surgeons at  the end of August. He has a new area which has opened up a little below his left abdominal wound. [name], [name] ([id number]) 10/16/16 on evaluation today patient's wounds on the abdominal region appears to be doing about the same. He actually has an appointment with his surgeon on the 21st for evaluation regarding the required products appointment. Obviously he is having a difficult time with the feeling of these wounds. Fortunately there does not appear to be any evidence of local infection such as significant amount of erythema or streaking. She also has an audio, vomiting, or diarrhea. He is not have any discomfort at this point. 10/30/2016 -- the surgical review is tomorrow and I am awaiting the input of the surgeon. 11/16/2016 -- I had a call from his surgeon Dr. Payton Doughty, on phone number [phone] who was kind enough to call me regarding his care. After a thorough review and having consulted the abdominal wall reconstruction team they have decided that surgery will be too extensive and rather moribund for this gentleman and have asked him to continue with local care at the wound center. She will send me an official note via Epic. 03/09/2017 -- the patient comes for some abdominal wall wounds which were caused by protruding mesh and complete surgical review was done at [facility] and no surgery has been recommended. He continues to get palliative care and the right abdominal wound is completely healed. He has a few open spots on the left which are being treated with palliative care and we see him once a month 05/04/17 on evaluation today Mr. Lombardo presents for follow-up concerning his abdominal surgical ulcer. He does have some additional mashed taking out of the wound at this point although he has made a lot of progress since last time I saw him. There is good epithelialization in a lot of areas although there is still mesh poking through which is preventing complete closure.  Fortunately there does not appear to be any evidence of infection. 05/18/17 on evaluation today patient appears to be doing very well in regard to his Adamo ulcer. Of the two areas that were previously open one has completely close in the proximal portion of the wound and the remainder of the opening which is the inferior portion is significantly smaller and there does not appear to be a lot of significant issue with mesh poking through the skin currently. In fact this is a nice rather circular indention where he has granulation in the base. There was some of the mesh still showing through but very little. Overall he has made significant improvements in my opinion since even two weeks ago when I saw him. 06/22/17 on evaluation today patient's abdominal ulcer on inspection appears to be doing much better. He still has 2 tiny openings at either end of the surgical site where he has scar tissue noted at this point. Fortunately there does not appear to be any evidence of infection which is good news. He does still have some evidence of the mesh showing which still seems to be preventing this from fully closing but fortunately this seems to be minimal even compared to my last evaluation. Overall he  has been doing very well. 07/06/17 on evaluation today patient's abdominal ulcer actually appears to be doing much better. He's showing signs of improvement little by little. Overall I feel this is appropriate as far as the progression that has been made. With that being said it's still been a very slow process. There's no evidence of infection. 07/20/17 on evaluation today patient appears to be doing rather well in regard to his left abdominal ulcer. He has been tolerating the dressing changes without complication fortunately there does not appear to be any evidence of infection. He still has two small openings although both of these appear to have filled in much more nicely compared to last week's  evaluation. There is no obvious exposed mesh at this point. 08/03/17 on evaluation today the patient's wound on the abdominal region appears to be doing excellent. In fact the upper portion which was still open show signs of being completely epithelial eyes which is good news. He still does have a small opening on the inferior portion but overall things have progressed extremely well in my opinion. Overall I'm hoping that she will see this completely close over the next couple of weeks. 08/17/17-He is here for preparation for an abdominal wound, there are 2 areas without complete epithelialization; the proximal area is fluid-filled and hyper granular, treated with silver nitrate, the distal aspect has a thin crusted layer of epithelium. There is no erythema to indicate infection. We will change treatment plan and he will follow-up next week 08/24/17 on evaluation today patient actually appears to be doing very well in regard to his abdominal ulcer. Unfortunately the little area that had closed previous when I saw him actually reopened when Denny Peon saw him last week. Subsequently the lower portion seems to have almost completely resolved which is good news in fact it may be closed even at this point. Nonetheless overall I'm very pleased with the progress that has been made up to this point. 08/31/17 on evaluation today patient's wound actually appears to be about the same as what I saw a previous. He does not [name], [name]. ([id number]) seem to be showing any signs of worsening although there's no signs of necessarily improving eider. It does appear that the patient may actually have a little bit of cellulitis in the surrounding scar tissue region where the two small openings are at this point. I look back at pictures from prior weeks and it looks like that slowly this has gotten a little bit more erythematous and I've kind of been watching this but it's definite today compared to previous. For this reason  I feel he made the at the point where antibiotic therapy would be beneficial for him. 09/07/17 on evaluation today patient appears to be doing very well. In fact he has just what may be a very small opening at this point in the abdominal region. Nonetheless I'm not even 100% sure this is a completely close. He's been tolerating the dressing changes without complication I do believe the antibiotic has been extremely helpful for him. In fact I think that was the biggest issue he was having with why these areas had been closed up last week. Overall I'm very happy with the appearance today. Patient History Information obtained from Patient. Social History Former smoker - chew tobacco, Marital Status - Widowed, Alcohol Use - Never, Drug Use - No History, Caffeine Use - Daily. 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 Respiratory The patient has no complaints or symptoms. Cardiovascular The patient has no complaints or symptoms. Psychiatric The patient has no complaints or symptoms. Objective Constitutional Well-nourished and well-hydrated in no acute distress. Vitals Time Taken: 10:14 AM, Height: 65 in, Weight: 161 lbs, BMI: 26.8, Temperature: 97.7 F, Pulse: 68 bpm, Respiratory Rate: 16 breaths/min, Blood Pressure: 142/78 mmHg. Respiratory normal breathing without difficulty. clear to auscultation bilaterally. Cardiovascular regular rate and rhythm with normal S1, S2. Psychiatric this patient is able to make decisions and demonstrates good insight into disease process. Alert and Oriented x 3. pleasant and cooperative. [name], [name] ([id number]) General Notes: Patient's wound bed shows signs of excellent improvement at this point as far as the opening as well as the infection is concerned. In general I think he is doing  excellent in this is almost completely closed. I am extremely pleased. The patient likewise is very happy Integumentary (Hair, Skin) Wound #2 status is Open. Original cause of wound was Gradually Appeared. The wound is located on the Left Abdomen - midline. The wound measures 0.1cm length x 0.1cm width x 0.1cm depth; 0.008cm^2 area and 0.001cm^3 volume. The wound is limited to skin breakdown. There is no tunneling or undermining noted. There is a small amount of serous drainage noted. The wound margin is flat and intact. There is large (67-100%) red granulation within the wound bed. There is no necrotic tissue within the wound bed. The periwound skin appearance exhibited: Induration, Scarring. The periwound skin appearance did not exhibit: Callus, Crepitus, Excoriation, Rash, Dry/Scaly, Maceration, Atrophie Blanche, Cyanosis, Ecchymosis, Hemosiderin Staining, Mottled, Pallor, Rubor, Erythema. Periwound temperature was noted as No Abnormality. Assessment Active Problems ICD-10 Type 2 diabetes mellitus with other skin ulcer Unspecified open wound of abdominal wall, epigastric region without penetration into peritoneal cavity, sequela Nicotine dependence, chewing tobacco, with other nicotine-induced disorders Plan Wound Cleansing: Wound #2 Left Abdomen - midline: Clean wound with Normal Saline. May Shower, gently pat wound dry prior to applying new dressing. Skin Barriers/Peri-Wound Care: Wound #2 Left Abdomen - midline: Skin Prep Secondary Dressing: Wound #2 Left Abdomen - midline: Other - [place] Dressing Change Frequency: Wound #2 Left Abdomen - midline: Change dressing every other day. Follow-up Appointments: Wound #2 Left Abdomen - midline: Return Appointment in 3 weeks. - July 19th At this point I'm gonna recommend that we actually continue with the Current wound care measures for the next week per above. We will see him back in three weeks time on July 19 for hopefully what  will be the last evaluation before discharge. With whom I'm hoping that everything will stay open and do well. Otherwise we're gonna see were things stand at follow-up will make adjustments as necessary at that point. Patient is in agreement with the plan. [name], [name] ([id number]) Electronic Signature(s) Signed: 09/08/2017 12:34:34 PM By: Worthy Keeler PA-C Entered By: Worthy Keeler on 09/08/2017 11:34:30 [name], [name] ([id number]) -------------------------------------------------------------------------------- ROS/PFSH Details Patient Name: [name] Date of Service: 09/07/2017 10:15 AM Medical Record Number: [id number] Patient Account Number: [id number] Date of Birth/Sex: 11-07-1945 (72 y.o. M) Treating RN: Montey Hora Primary Care Provider: Thomes Lolling Other Clinician: Referring Provider: Thomes Lolling Treating Provider/Extender: Melburn Hake, Delailah Spieth Weeks in Treatment: 31 Information Obtained From Patient Wound History Do you currently have one or more open woundso Yes How many open wounds do you currently haveo 2 Approximately how long have you had your  woundso more than 1 year How have you been treating your wound(s) until nowo open to air Has your wound(s) ever healed and then re-openedo No Have you had any lab work done in the past montho No Have you tested positive for an antibiotic resistant organism (MRSA, VRE)o No Have you tested positive for osteomyelitis (bone infection)o No Have you had any tests for circulation on your legso No Constitutional Symptoms (General Health) Complaints and Symptoms: Negative for: Fever; Chills Eyes Medical History: Positive for: Cataracts - removed Hematologic/Lymphatic Medical History: Negative for: Anemia; Hemophilia; Human Immunodeficiency Virus; Lymphedema; Sickle Cell Disease Respiratory Complaints and Symptoms: No Complaints or Symptoms Medical History: Positive for: Chronic Obstructive Pulmonary Disease  (COPD) Negative for: Aspiration; Asthma; Pneumothorax; Sleep Apnea; Tuberculosis Cardiovascular Complaints and Symptoms: No Complaints or Symptoms Medical History: Positive for: Hypertension Negative for: Angina; Arrhythmia; Congestive Heart Failure; Coronary Artery Disease; Deep Vein Thrombosis; Hypotension; Myocardial Infarction; Peripheral Arterial Disease; Peripheral Venous Disease; Phlebitis; Vasculitis Gastrointestinal [name], [name] ([id number]) Medical History: Negative for: Cirrhosis ; Colitis; Crohnos; Hepatitis A; Hepatitis B; Hepatitis C Endocrine Medical History: Positive for: Type II Diabetes Treated with: Oral agents Blood sugar tested every day: No Genitourinary Medical History: Negative for: End Stage Renal Disease Immunological Medical History: Negative for: Lupus Erythematosus; Raynaudos; Scleroderma Integumentary (Skin) Medical History: Negative for: History of Burn; History of pressure wounds Musculoskeletal Medical History: Positive for: Osteoarthritis Negative for: Gout; Rheumatoid Arthritis; Osteomyelitis Neurologic Medical History: Positive for: Neuropathy Negative for: Dementia; Quadriplegia; Paraplegia; Seizure Disorder Oncologic Medical History: Negative for: Received Chemotherapy; Received Radiation Past Medical History Notes: 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 Psychiatric Complaints and Symptoms: No Complaints or Symptoms Medical History: Negative for: Anorexia/bulimia; Confinement Anxiety HBO Extended History Items Eyes: Cataracts Immunizations Pneumococcal Vaccine: [name], [name] ([id number]) Received Pneumococcal Vaccination: Yes Immunization Notes: up to date Implantable Devices Family and Social History Former smoker - chew tobacco; Marital Status - Widowed; Alcohol Use: Never; Drug Use: No History; Caffeine Use: Daily; Financial Concerns: No; Food, Clothing or Shelter Needs: No; Support System Lacking: No;  Transportation Concerns: No; Advanced Directives: No; Patient does not want information on Advanced Directives Physician Affirmation I have reviewed and agree with the above information. Electronic Signature(s) Signed: 09/08/2017 12:34:34 PM By: Worthy Keeler PA-C Signed: 09/10/2017 5:27:38 PM By: Montey Hora Entered By: Worthy Keeler on 09/08/2017 11:33:10 [name] ([id number]) -------------------------------------------------------------------------------- SuperBill Details Patient Name: [name] Date of Service: 09/07/2017 Medical Record Number: [id number] Patient Account Number: [id number] Date of Birth/Sex: 09/07/1945 (72 y.o. M) Treating RN: Montey Hora Primary Care Provider: Thomes Lolling Other Clinician: Referring Provider: Thomes Lolling Treating Provider/Extender: Sharalyn Ink in Treatment: 34 Diagnosis Coding ICD-10 Codes Code Description E11.622 Type 2 diabetes mellitus with other skin ulcer Unspecified open wound of abdominal wall, epigastric region without penetration into peritoneal cavity, S31.102S sequela F17.228 Nicotine dependence, chewing tobacco, with other nicotine-induced disorders Facility Procedures CPT4 Code: [id number] Description: 68032 - WOUND CARE VISIT-LEV 2 EST PT Modifier: Quantity: 1 Physician Procedures CPT4: Description Modifier Quantity Code [id number] - WC PHYS LEVEL 3 - EST PT 1 ICD-10 Diagnosis Description E11.622 Type 2 diabetes mellitus with other skin ulcer S31.102S Unspecified open wound of abdominal wall, epigastric region without  penetration into peritoneal cavity, sequela F17.228 Nicotine dependence, chewing tobacco, with other nicotine-induced disorders Electronic Signature(s) Signed: 09/08/2017 12:34:34 PM By: Worthy Keeler PA-C Entered By: Worthy Keeler on 09/08/2017 11:34:48

## 2017-09-17 MED ORDER — LISINOPRIL 10 MG TABLET
ORAL_TABLET | 3 refills | 0 days | Status: CP
Start: 2017-09-17 — End: 2018-09-12

## 2017-09-26 MED ORDER — LORATADINE 10 MG TABLET: 10 mg | each | 3 refills | 0 days

## 2017-09-26 MED ORDER — LORATADINE 10 MG TABLET
ORAL_TABLET | Freq: Every day | ORAL | 3 refills | 0.00000 days | Status: CP
Start: 2017-09-26 — End: 2017-09-26

## 2017-09-26 MED ORDER — OXYCODONE-ACETAMINOPHEN 5 MG-325 MG TABLET
ORAL_TABLET | ORAL | 0 refills | 0 days | Status: CP | PRN
Start: 2017-09-26 — End: 2017-10-26

## 2017-09-28 ENCOUNTER — Encounter: Payer: Medicare HMO | Attending: Physician Assistant | Admitting: Physician Assistant

## 2017-10-01 NOTE — Progress Notes (Signed)
BURLON, CENTRELLA ([id number]) Visit Report for 09/28/2017 Chief Complaint Document Details Patient Name: [name], [name]. Date of Service: 09/28/2017 10:00 AM Medical Record Number: [id number] Patient Account Number: [id number] Date of Birth/Sex: February 07, 1946 (72 [name].o. M) Treating RN: Montey Hora Primary Care Provider: Thomes Lolling Other Clinician: Referring Provider: Thomes Lolling Treating Provider/Extender: Sharalyn Ink in Treatment: 76 Information Obtained from: Patient Chief Complaint anterior abdominal wall in the epigastric region Electronic Signature(s) Signed: 09/29/2017 1:32:06 AM By: Worthy Keeler PA-C Entered By: Worthy Keeler on 09/28/2017 10:06:13 [name] ([id number]) -------------------------------------------------------------------------------- HPI Details Patient Name: [name] Date of Service: 09/28/2017 10:00 AM Medical Record Number: [id number] Patient Account Number: [id number] Date of Birth/Sex: [birth year] (42 [name].o. M) Treating RN: Montey Hora Primary Care Provider: Thomes Lolling Other Clinician: Referring Provider: Thomes Lolling Treating Provider/Extender: Melburn Hake, HOYT Weeks in Treatment: 22 History of Present Illness HPI Description: 72 year old gentleman has been seen recently by his PCP Dr. Thomes Lolling, who sees him with a history of diabetes mellitus, peripheral neuropathy, B12 deficiency, hypertension, osteoarthritis and the patient had recent blood sugar checked which was 163. Past medical history includes essential hypertension, incisional hernia, diabetes mellitus, colon polyps, COPD, tobacco abuse in the past, chewing tobacco, GERD. He has also been treated for chronic back pain with no sciatica and is on oxycodone. His last hemoglobin A1c was 6.9% His abdominal surgery was done at [facility] over 15 years ago and gradually there was sutures which protruded and then there was mesh which protruded. He washes this during  his shower but does not put any dressing and the wound is covered with a lot of debris. He has never had a surgical consultation for this. 07/10/2016 -- the patient has spoken to his PCP who said he was going to call me but I have not yet had a phone conversation with the physician. From what I understand the physician was reluctant to have a surgical opinion because of the patient's several comorbidities and he feels that the surgery may be detrimental to his overall health 07/24/2016 -- the patient's daughter is at the bedside and we've had a detailed discussion regarding his options for a surgical opinion and possible surgical intervention before this becomes a emergent situation. She will seek primary care input and take the father to [facility] for a surgical opinion. 07/31/2016 -- the patient's daughter has organized for a surgical opinion at [facility] on June 14 08/14/2016 -- it has been about 3 weeks since he has stopped chewing tobacco. 08/28/2016 -- he was seen at [facility] by Dr. Roby Lofts -- the assessment was based on physical examination and review of his previous history of surgical repair. the patient had undergone a ventral hernia repair in 1998 with Dr. Quay Burow at [facility] at a prior open cholecystectomy incision. he was evaluated by Dr. Denice Paradise in 2013 and at that time it was decided to watch and wait. Dr. Payton Doughty recommended a repeat CT scan and requested records from dura and regional operative records and would follow-up the patient, after Mr. Brunton PCP Dr. Gwenlyn Saran reviewed him for overall fitness for surgery. 09/04/16 patient's wound actually appear to be doing well on evaluation today. He is not really experience any discomfort and the right abdominal wound is dry and almost appears to be healing up. He continues to have some drainage from the left but this seems to be more of a potential seroma which with milking is able to  be cleared out. I discussed with  patient that when he performs his dressing changes at home it would be beneficial for him to do this as well. Otherwise there's no evidence of infection. He is still waiting on his CT scan and appointments to determine whether he is going to have another surgery. 09/11/2016 -- the patient has had a CT scan and is awaiting his surgical appointment prior to deciding on any surgical intervention. 09/25/16 patient presents today for fault evaluation concerning his ongoing abdominal surgical wounds. Unfortunately they do not appear to be improving significantly but rather fluctuate from a little better to little worse week by week. Obviously this is somewhat frustrating for him. 10/02/2016 -- the patient has completed his CT scan, and has a review by the surgeons at the end of August. He has a new area which has opened up a little below his left abdominal wound. 10/16/16 on evaluation today patient's wounds on the abdominal region appears to be doing about the same. He actually has an appointment with his surgeon on the 21st for evaluation regarding the required products appointment. Obviously he is having a difficult time with the feeling of these wounds. Fortunately there does not appear to be any evidence of local infection such as significant amount of erythema or streaking. She also has an audio, vomiting, or diarrhea. He is not have any discomfort at this point. [name], [name] ([id number]) 10/30/2016 -- the surgical review is tomorrow and I am awaiting the input of the surgeon. 11/16/2016 -- I had a call from his surgeon Dr. Payton Doughty, on phone number [phone] who was kind enough to call me regarding his care. After a thorough review and having consulted the abdominal wall reconstruction team they have decided that surgery will be too extensive and rather moribund for this gentleman and have asked him to continue with local care at the wound center. She will send me an official note via  Epic. 03/09/2017 -- the patient comes for some abdominal wall wounds which were caused by protruding mesh and complete surgical review was done at [facility] and no surgery has been recommended. He continues to get palliative care and the right abdominal wound is completely healed. He has a few open spots on the left which are being treated with palliative care and we see him once a month 05/04/17 on evaluation today Mr. Basso presents for follow-up concerning his abdominal surgical ulcer. He does have some additional mashed taking out of the wound at this point although he has made a lot of progress since last time I saw him. There is good epithelialization in a lot of areas although there is still mesh poking through which is preventing complete closure. Fortunately there does not appear to be any evidence of infection. 05/18/17 on evaluation today patient appears to be doing very well in regard to his Adamo ulcer. Of the two areas that were previously open one has completely close in the proximal portion of the wound and the remainder of the opening which is the inferior portion is significantly smaller and there does not appear to be a lot of significant issue with mesh poking through the skin currently. In fact this is a nice rather circular indention where he has granulation in the base. There was some of the mesh still showing through but very little. Overall he has made significant improvements in my opinion since even two weeks ago when I saw him. 06/22/17 on evaluation today patient's abdominal ulcer  on inspection appears to be doing much better. He still has 2 tiny openings at either end of the surgical site where he has scar tissue noted at this point. Fortunately there does not appear to be any evidence of infection which is good news. He does still have some evidence of the mesh showing which still seems to be preventing this from fully closing but fortunately this seems to be  minimal even compared to my last evaluation. Overall he has been doing very well. 07/06/17 on evaluation today patient's abdominal ulcer actually appears to be doing much better. He's showing signs of improvement little by little. Overall I feel this is appropriate as far as the progression that has been made. With that being said it's still been a very slow process. There's no evidence of infection. 07/20/17 on evaluation today patient appears to be doing rather well in regard to his left abdominal ulcer. He has been tolerating the dressing changes without complication fortunately there does not appear to be any evidence of infection. He still has two small openings although both of these appear to have filled in much more nicely compared to last week's evaluation. There is no obvious exposed mesh at this point. 08/03/17 on evaluation today the patient's wound on the abdominal region appears to be doing excellent. In fact the upper portion which was still open show signs of being completely epithelial eyes which is good news. He still does have a small opening on the inferior portion but overall things have progressed extremely well in my opinion. Overall I'm hoping that she will see this completely close over the next couple of weeks. 08/17/17-He is here for preparation for an abdominal wound, there are 2 areas without complete epithelialization; the proximal area is fluid-filled and hyper granular, treated with silver nitrate, the distal aspect has a thin crusted layer of epithelium. There is no erythema to indicate infection. We will change treatment plan and he will follow-up next week 08/24/17 on evaluation today patient actually appears to be doing very well in regard to his abdominal ulcer. Unfortunately the little area that had closed previous when I saw him actually reopened when Denny Peon saw him last week. Subsequently the lower portion seems to have almost completely resolved which is good news  in fact it may be closed even at this point. Nonetheless overall I'm very pleased with the progress that has been made up to this point. 08/31/17 on evaluation today patient's wound actually appears to be about the same as what I saw a previous. He does not seem to be showing any signs of worsening although there's no signs of necessarily improving eider. It does appear that the patient may actually have a little bit of cellulitis in the surrounding scar tissue region where the two small openings are at this point. I look back at pictures from prior weeks and it looks like that slowly this has gotten a little bit more erythematous and I've kind of been watching this but it's definite today compared to previous. For this reason I feel he made the at the point where antibiotic therapy would be beneficial for him. [name], [name] ([id number]) 09/07/17 on evaluation today patient appears to be doing very well. In fact he has just what may be a very small opening at this point in the abdominal region. Nonetheless I'm not even 100% sure this is a completely close. He's been tolerating the dressing changes without complication I do believe the antibiotic has been extremely  helpful for him. In fact I think that was the biggest issue he was having with why these areas had been closed up last week. Overall I'm very happy with the appearance today. 09/28/17 on evaluation today patient actually appears to be doing very well in regard to the inferior portion of his abdominal wound. He has been tolerating the dressing changes without complication. Unfortunately in the superior region he does have an area that open that was not as open dear in the last visit. Nonetheless this does not appear to be infected I feel like in general everything is doing well in that regard at least. Nonetheless he is somewhat frustrated he was really hoping I do believe that he would have this completely healed at this point. Electronic  Signature(s) Signed: 09/29/2017 1:32:06 AM By: Worthy Keeler PA-C Entered By: Worthy Keeler on 09/28/2017 23:47:27 [name] ([id number]) -------------------------------------------------------------------------------- Physical Exam Details Patient Name: [name], [name] Date of Service: 09/28/2017 10:00 AM Medical Record Number: [id number] Patient Account Number: [id number] Date of Birth/Sex: 21-Dec-1945 (15 [name].o. M) Treating RN: Montey Hora Primary Care Provider: Thomes Lolling Other Clinician: Referring Provider: Thomes Lolling Treating Provider/Extender: Melburn Hake, HOYT Weeks in Treatment: 89 Constitutional Well-nourished and well-hydrated in no acute distress. Respiratory normal breathing without difficulty. clear to auscultation bilaterally. Cardiovascular regular rate and rhythm with normal S1, S2. Psychiatric this patient is able to make decisions and demonstrates good insight into disease process. Alert and Oriented x 3. pleasant and cooperative. Notes At this point on evaluation there is no sharp debridement necessary currently. With that being said the patient does seem to be tolerating the dressing changes well I do think we want to reinitiate the collagen dressing which has been beneficial in the past. We will also initiate the use of skin prep in order to protect any healed region. Otherwise I think this is just a matter of time and it will be completely close. Electronic Signature(s) Signed: 09/29/2017 1:32:06 AM By: Worthy Keeler PA-C Entered By: Worthy Keeler on 09/28/2017 23:48:19 [name] ([id number]) -------------------------------------------------------------------------------- Physician Orders Details Patient Name: [name], [name] Date of Service: 09/28/2017 10:00 AM Medical Record Number: [id number] Patient Account Number: [id number] Date of Birth/Sex: 1945-05-13 (72 [name].o. M) Treating RN: Montey Hora Primary Care Provider: Thomes Lolling  Other Clinician: Referring Provider: Thomes Lolling Treating Provider/Extender: Sharalyn Ink in Treatment: 28 Verbal / Phone Orders: No Diagnosis Coding ICD-10 Coding Code Description E11.622 Type 2 diabetes mellitus with other skin ulcer Unspecified open wound of abdominal wall, epigastric region without penetration into peritoneal cavity, S31.102S sequela F17.228 Nicotine dependence, chewing tobacco, with other nicotine-induced disorders Wound Cleansing Wound #2 Left Abdomen - midline o Clean wound with Normal Saline. o May Shower, gently pat wound dry prior to applying new dressing. Skin Barriers/Peri-Wound Care Wound #2 Left Abdomen - midline o Skin Prep Primary Wound Dressing Wound #2 Left Abdomen - midline o Silver Collagen Secondary Dressing Wound #2 Left Abdomen - midline o Other - [place] Dressing Change Frequency Wound #2 Left Abdomen - midline o Change dressing every other day. Follow-up Appointments Wound #2 Left Abdomen - midline o Return Appointment in 2 weeks. Electronic Signature(s) Signed: 09/29/2017 1:32:06 AM By: Worthy Keeler PA-C Signed: 10/01/2017 2:22:32 PM By: Montey Hora Entered By: Montey Hora on 09/28/2017 10:26:45 [name] ([id number]) -------------------------------------------------------------------------------- Problem List Details Patient Name: [name], [name] Date of Service: 09/28/2017 10:00 AM Medical Record Number: [id number] Patient Account Number: [id number]  Date of Birth/Sex: 02/23/1946 (56 [name].o. M) Treating RN: Montey Hora Primary Care Provider: Thomes Lolling Other Clinician: Referring Provider: Thomes Lolling Treating Provider/Extender: Sharalyn Ink in Treatment: 66 Active Problems ICD-10 Evaluated Encounter Code Description Active Date Today Diagnosis E11.622 Type 2 diabetes mellitus with other skin ulcer 07/03/2016 No Yes S31.102S Unspecified open wound of abdominal wall,  epigastric region 07/03/2016 No Yes without penetration into peritoneal cavity, sequela F17.228 Nicotine dependence, chewing tobacco, with other nicotine- 07/03/2016 No Yes induced disorders Inactive Problems Resolved Problems ICD-10 Code Description Active Date Resolved Date S31.100A Unspecified open wound of abdominal wall, right upper quadrant 07/03/2016 07/03/2016 without penetration into peritoneal cavity, initial encounter Electronic Signature(s) Signed: 09/29/2017 1:32:06 AM By: Worthy Keeler PA-C Entered By: Worthy Keeler on 09/28/2017 10:06:05 [name] ([id number]) -------------------------------------------------------------------------------- Progress Note Details Patient Name: [name] Date of Service: 09/28/2017 10:00 AM Medical Record Number: [id number] Patient Account Number: [id number] Date of Birth/Sex: 03/03/1946 (14 [name].o. M) Treating RN: Montey Hora Primary Care Provider: Thomes Lolling Other Clinician: Referring Provider: Thomes Lolling Treating Provider/Extender: Sharalyn Ink in Treatment: 61 Subjective Chief Complaint Information obtained from Patient anterior abdominal wall in the epigastric region History of Present Illness (HPI) 72 year old gentleman has been seen recently by his PCP Dr. Thomes Lolling, who sees him with a history of diabetes mellitus, peripheral neuropathy, B12 deficiency, hypertension, osteoarthritis and the patient had recent blood sugar checked which was 163. Past medical history includes essential hypertension, incisional hernia, diabetes mellitus, colon polyps, COPD, tobacco abuse in the past, chewing tobacco, GERD. He has also been treated for chronic back pain with no sciatica and is on oxycodone. His last hemoglobin A1c was 6.9% His abdominal surgery was done at [facility] over 15 years ago and gradually there was sutures which protruded and then there was mesh which protruded. He washes this during his shower  but does not put any dressing and the wound is covered with a lot of debris. He has never had a surgical consultation for this. 07/10/2016 -- the patient has spoken to his PCP who said he was going to call me but I have not yet had a phone conversation with the physician. From what I understand the physician was reluctant to have a surgical opinion because of the patient's several comorbidities and he feels that the surgery may be detrimental to his overall health 07/24/2016 -- the patient's daughter is at the bedside and we've had a detailed discussion regarding his options for a surgical opinion and possible surgical intervention before this becomes a emergent situation. She will seek primary care input and take the father to [facility] for a surgical opinion. 07/31/2016 -- the patient's daughter has organized for a surgical opinion at [facility] on June 14 08/14/2016 -- it has been about 3 weeks since he has stopped chewing tobacco. 08/28/2016 -- he was seen at [facility] by Dr. Roby Lofts -- the assessment was based on physical examination and review of his previous history of surgical repair. the patient had undergone a ventral hernia repair in 1998 with Dr. Quay Burow at [facility] at a prior open cholecystectomy incision. he was evaluated by Dr. Denice Paradise in 2013 and at that time it was decided to watch and wait. Dr. Payton Doughty recommended a repeat CT scan and requested records from dura and regional operative records and would follow-up the patient, after Mr. Soulliere PCP Dr. Gwenlyn Saran reviewed him for overall fitness for surgery. 09/04/16 patient's wound  actually appear to be doing well on evaluation today. He is not really experience any discomfort and the right abdominal wound is dry and almost appears to be healing up. He continues to have some drainage from the left but this seems to be more of a potential seroma which with milking is able to be cleared out. I discussed with patient that  when he performs his dressing changes at home it would be beneficial for him to do this as well. Otherwise there's no evidence of infection. He is still waiting on his CT scan and appointments to determine whether he is going to have another surgery. 09/11/2016 -- the patient has had a CT scan and is awaiting his surgical appointment prior to deciding on any surgical intervention. 09/25/16 patient presents today for fault evaluation concerning his ongoing abdominal surgical wounds. Unfortunately they do not appear to be improving significantly but rather fluctuate from a little better to little worse week by week. Obviously this is somewhat frustrating for him. 10/02/2016 -- the patient has completed his CT scan, and has a review by the surgeons at the end of August. He has a new area which has opened up a little below his left abdominal wound. [name], [name] ([id number]) 10/16/16 on evaluation today patient's wounds on the abdominal region appears to be doing about the same. He actually has an appointment with his surgeon on the 21st for evaluation regarding the required products appointment. Obviously he is having a difficult time with the feeling of these wounds. Fortunately there does not appear to be any evidence of local infection such as significant amount of erythema or streaking. She also has an audio, vomiting, or diarrhea. He is not have any discomfort at this point. 10/30/2016 -- the surgical review is tomorrow and I am awaiting the input of the surgeon. 11/16/2016 -- I had a call from his surgeon Dr. Payton Doughty, on phone number [phone] who was kind enough to call me regarding his care. After a thorough review and having consulted the abdominal wall reconstruction team they have decided that surgery will be too extensive and rather moribund for this gentleman and have asked him to continue with local care at the wound center. She will send me an official note via Epic. 03/09/2017 --  the patient comes for some abdominal wall wounds which were caused by protruding mesh and complete surgical review was done at [facility] and no surgery has been recommended. He continues to get palliative care and the right abdominal wound is completely healed. He has a few open spots on the left which are being treated with palliative care and we see him once a month 05/04/17 on evaluation today Mr. Loera presents for follow-up concerning his abdominal surgical ulcer. He does have some additional mashed taking out of the wound at this point although he has made a lot of progress since last time I saw him. There is good epithelialization in a lot of areas although there is still mesh poking through which is preventing complete closure. Fortunately there does not appear to be any evidence of infection. 05/18/17 on evaluation today patient appears to be doing very well in regard to his Adamo ulcer. Of the two areas that were previously open one has completely close in the proximal portion of the wound and the remainder of the opening which is the inferior portion is significantly smaller and there does not appear to be a lot of significant issue with mesh poking through the  skin currently. In fact this is a nice rather circular indention where he has granulation in the base. There was some of the mesh still showing through but very little. Overall he has made significant improvements in my opinion since even two weeks ago when I saw him. 06/22/17 on evaluation today patient's abdominal ulcer on inspection appears to be doing much better. He still has 2 tiny openings at either end of the surgical site where he has scar tissue noted at this point. Fortunately there does not appear to be any evidence of infection which is good news. He does still have some evidence of the mesh showing which still seems to be preventing this from fully closing but fortunately this seems to be minimal even compared to my  last evaluation. Overall he has been doing very well. 07/06/17 on evaluation today patient's abdominal ulcer actually appears to be doing much better. He's showing signs of improvement little by little. Overall I feel this is appropriate as far as the progression that has been made. With that being said it's still been a very slow process. There's no evidence of infection. 07/20/17 on evaluation today patient appears to be doing rather well in regard to his left abdominal ulcer. He has been tolerating the dressing changes without complication fortunately there does not appear to be any evidence of infection. He still has two small openings although both of these appear to have filled in much more nicely compared to last week's evaluation. There is no obvious exposed mesh at this point. 08/03/17 on evaluation today the patient's wound on the abdominal region appears to be doing excellent. In fact the upper portion which was still open show signs of being completely epithelial eyes which is good news. He still does have a small opening on the inferior portion but overall things have progressed extremely well in my opinion. Overall I'm hoping that she will see this completely close over the next couple of weeks. 08/17/17-He is here for preparation for an abdominal wound, there are 2 areas without complete epithelialization; the proximal area is fluid-filled and hyper granular, treated with silver nitrate, the distal aspect has a thin crusted layer of epithelium. There is no erythema to indicate infection. We will change treatment plan and he will follow-up next week 08/24/17 on evaluation today patient actually appears to be doing very well in regard to his abdominal ulcer. Unfortunately the little area that had closed previous when I saw him actually reopened when Denny Peon saw him last week. Subsequently the lower portion seems to have almost completely resolved which is good news in fact it may be closed even  at this point. Nonetheless overall I'm very pleased with the progress that has been made up to this point. 08/31/17 on evaluation today patient's wound actually appears to be about the same as what I saw a previous. He does not [name], [name]. ([id number]) seem to be showing any signs of worsening although there's no signs of necessarily improving eider. It does appear that the patient may actually have a little bit of cellulitis in the surrounding scar tissue region where the two small openings are at this point. I look back at pictures from prior weeks and it looks like that slowly this has gotten a little bit more erythematous and I've kind of been watching this but it's definite today compared to previous. For this reason I feel he made the at the point where antibiotic therapy would be beneficial for him. 09/07/17 on  evaluation today patient appears to be doing very well. In fact he has just what may be a very small opening at this point in the abdominal region. Nonetheless I'm not even 100% sure this is a completely close. He's been tolerating the dressing changes without complication I do believe the antibiotic has been extremely helpful for him. In fact I think that was the biggest issue he was having with why these areas had been closed up last week. Overall I'm very happy with the appearance today. 09/28/17 on evaluation today patient actually appears to be doing very well in regard to the inferior portion of his abdominal wound. He has been tolerating the dressing changes without complication. Unfortunately in the superior region he does have an area that open that was not as open dear in the last visit. Nonetheless this does not appear to be infected I feel like in general everything is doing well in that regard at least. Nonetheless he is somewhat frustrated he was really hoping I do believe that he would have this completely healed at this point. Patient History Information obtained  from Patient. Social History Former smoker - chew tobacco, Marital Status - Widowed, Alcohol Use - Never, Drug Use - No History, Caffeine Use - Daily. 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 Respiratory The patient has no complaints or symptoms. Cardiovascular The patient has no complaints or symptoms. Psychiatric The patient has no complaints or symptoms. Objective Constitutional Well-nourished and well-hydrated in no acute distress. Vitals Time Taken: 9:59 AM, Height: 65 in, Weight: 161 lbs, BMI: 26.8, Temperature: 97.8 F, Respiratory Rate: 16 breaths/min. Respiratory normal breathing without difficulty. clear to auscultation bilaterally. [name], [name] ([id number]) Cardiovascular regular rate and rhythm with normal S1, S2. Psychiatric this patient is able to make decisions and demonstrates good insight into disease process. Alert and Oriented x 3. pleasant and cooperative. General Notes: At this point on evaluation there is no sharp debridement necessary currently. With that being said the patient does seem to be tolerating the dressing changes well I do think we want to reinitiate the collagen dressing which has been beneficial in the past. We will also initiate the use of skin prep in order to protect any healed region. Otherwise I think this is just a matter of time and it will be completely close. Integumentary (Hair, Skin) Wound #2 status is Open. Original cause of wound was Gradually Appeared. The wound is located on the Left Abdomen - midline. The wound measures 0.4cm length x 0.3cm width x 0.3cm depth; 0.094cm^2 area and 0.028cm^3 volume. The wound is limited to skin breakdown. There is no tunneling or undermining noted. There is a medium amount of serous drainage noted. The wound margin is flat and  intact. There is large (67-100%) red granulation within the wound bed. There is a small (1-33%) amount of necrotic tissue within the wound bed including Adherent Slough. The periwound skin appearance exhibited: Induration, Scarring. The periwound skin appearance did not exhibit: Callus, Crepitus, Excoriation, Rash, Dry/Scaly, Maceration, Atrophie Blanche, Cyanosis, Ecchymosis, Hemosiderin Staining, Mottled, Pallor, Rubor, Erythema. Periwound temperature was noted as No Abnormality. Assessment Active Problems ICD-10 Type 2 diabetes mellitus with other skin ulcer Unspecified open wound of abdominal wall, epigastric region without penetration into peritoneal cavity, sequela Nicotine dependence, chewing tobacco, with other nicotine-induced disorders Plan Wound Cleansing: Wound #2 Left Abdomen - midline:  Clean wound with Normal Saline. May Shower, gently pat wound dry prior to applying new dressing. Skin Barriers/Peri-Wound Care: Wound #2 Left Abdomen - midline: Skin Prep Primary Wound Dressing: Wound #2 Left Abdomen - midline: Silver Collagen Secondary Dressing: Wound #2 Left Abdomen - midline: Other - [place] Dressing Change Frequency: Wound #2 Left Abdomen - midline: Change dressing every other day. YOHANNES, WAIBEL ([id number]) Follow-up Appointments: Wound #2 Left Abdomen - midline: Return Appointment in 2 weeks. I am going to suggest at this point that we go ahead and continue with the Current wound care measures for the next week. The patient's agreement the plan. We will see were things stand actually in a two weeks. Time. In the meantime if anything worsens or changes he will contact our office as needed. Please see above for specific wound care orders. We will see patient for re-evaluation in 2 week(s) here in the clinic. If anything worsens or changes patient will contact our office for additional recommendations. Electronic Signature(s) Signed: 09/29/2017 1:32:06 AM By:  Worthy Keeler PA-C Entered By: Worthy Keeler on 09/28/2017 23:48:37 [name] ([id number]) -------------------------------------------------------------------------------- ROS/PFSH Details Patient Name: [name] Date of Service: 09/28/2017 10:00 AM Medical Record Number: [id number] Patient Account Number: [id number] Date of Birth/Sex: 05-30-1945 (7 [name].o. M) Treating RN: Montey Hora Primary Care Provider: Thomes Lolling Other Clinician: Referring Provider: Thomes Lolling Treating Provider/Extender: Melburn Hake, HOYT Weeks in Treatment: 39 Information Obtained From Patient Wound History Do you currently have one or more open woundso Yes How many open wounds do you currently haveo 2 Approximately how long have you had your woundso more than 1 year How have you been treating your wound(s) until nowo open to air Has your wound(s) ever healed and then re-openedo No Have you had any lab work done in the past montho No Have you tested positive for an antibiotic resistant organism (MRSA, VRE)o No Have you tested positive for osteomyelitis (bone infection)o No Have you had any tests for circulation on your legso No Constitutional Symptoms (General Health) Complaints and Symptoms: Negative for: Fever; Chills Eyes Medical History: Positive for: Cataracts - removed Hematologic/Lymphatic Medical History: Negative for: Anemia; Hemophilia; Human Immunodeficiency Virus; Lymphedema; Sickle Cell Disease Respiratory Complaints and Symptoms: No Complaints or Symptoms Medical History: Positive for: Chronic Obstructive Pulmonary Disease (COPD) Negative for: Aspiration; Asthma; Pneumothorax; Sleep Apnea; Tuberculosis Cardiovascular Complaints and Symptoms: No Complaints or Symptoms Medical History: Positive for: Hypertension Negative for: Angina; Arrhythmia; Congestive Heart Failure; Coronary Artery Disease; Deep Vein Thrombosis; Hypotension; Myocardial Infarction; Peripheral  Arterial Disease; Peripheral Venous Disease; Phlebitis; Vasculitis Gastrointestinal RODDIE, [name] ([id number]) Medical History: Negative for: Cirrhosis ; Colitis; Crohnos; Hepatitis A; Hepatitis B; Hepatitis C Endocrine Medical History: Positive for: Type II Diabetes Treated with: Oral agents Blood sugar tested every day: No Genitourinary Medical History: Negative for: End Stage Renal Disease Immunological Medical History: Negative for: Lupus Erythematosus; Raynaudos; Scleroderma Integumentary (Skin) Medical History: Negative for: History of Burn; History of pressure wounds Musculoskeletal Medical History: Positive for: Osteoarthritis Negative for: Gout; Rheumatoid Arthritis; Osteomyelitis Neurologic Medical History: Positive for: Neuropathy Negative for: Dementia; Quadriplegia; Paraplegia; Seizure Disorder Oncologic Medical History: Negative for: Received Chemotherapy; Received Radiation Past Medical History Notes: 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 Psychiatric Complaints and Symptoms: No Complaints or Symptoms Medical History: Negative for: Anorexia/bulimia; Confinement Anxiety HBO Extended History Items Eyes: Cataracts Immunizations Pneumococcal Vaccine: HENSON, FRATICELLI ([id number]) Received Pneumococcal Vaccination: Yes Immunization Notes: up to date Implantable Devices  Family and Social History Former smoker - chew tobacco; Marital Status - Widowed; Alcohol Use: Never; Drug Use: No History; Caffeine Use: Daily; Financial Concerns: No; Food, Clothing or Shelter Needs: No; Support System Lacking: No; Transportation Concerns: No; Advanced Directives: No; Patient does not want information on Advanced Directives Physician Affirmation I have reviewed and agree with the above information. Electronic Signature(s) Signed: 09/29/2017 1:32:06 AM By: Worthy Keeler PA-C Signed: 10/01/2017 2:22:32 PM By: Montey Hora Entered By: Worthy Keeler on  09/28/2017 23:47:56 Risinger, Waneta Martins ([id number]) -------------------------------------------------------------------------------- SuperBill Details Patient Name: TJ, KITCHINGS Date of Service: 09/28/2017 Medical Record Number: [id number] Patient Account Number: [id number] Date of Birth/Sex: [birth year] (89 [name].o. M) Treating RN: Montey Hora Primary Care Provider: Thomes Lolling Other Clinician: Referring Provider: Thomes Lolling Treating Provider/Extender: Sharalyn Ink in Treatment: 74 Diagnosis Coding ICD-10 Codes Code Description E11.622 Type 2 diabetes mellitus with other skin ulcer Unspecified open wound of abdominal wall, epigastric region without penetration into peritoneal cavity, S31.102S sequela F17.228 Nicotine dependence, chewing tobacco, with other nicotine-induced disorders Physician Procedures CPT4: Description Modifier Quantity Code [id number] - WC PHYS LEVEL 3 - EST PT 1 ICD-10 Diagnosis Description E11.622 Type 2 diabetes mellitus with other skin ulcer S31.102S Unspecified open wound of abdominal wall, epigastric region without  penetration into peritoneal cavity, sequela F17.228 Nicotine dependence, chewing tobacco, with other nicotine-induced disorders Electronic Signature(s) Signed: 09/29/2017 1:32:06 AM By: Worthy Keeler PA-C Entered By: Worthy Keeler on 09/28/2017 23:48:59

## 2017-10-02 NOTE — Progress Notes (Signed)
ETHERIDGE, GEIL ([id number]) Visit Report for 09/28/2017 Arrival Information Details Patient Name: [name], [name]. Date of Service: 09/28/2017 10:00 AM Medical Record Number: [id number] Patient Account Number: [id number] Date of Birth/Sex: [birth year] (72 y.o. M) Treating RN: Roger Shelter Primary Care Alasia Enge: Thomes Lolling Other Clinician: Referring Vail Vuncannon: Thomes Lolling Treating Jahmar Mckelvy/Extender: Sharalyn Ink in Treatment: 88 Visit Information History Since Last Visit All ordered tests and consults were completed: No Patient Arrived: Ambulatory Added or deleted any medications: No Arrival Time: 09:58 Any new allergies or adverse reactions: No Accompanied By: self Had a fall or experienced change in No Transfer Assistance: None activities of daily living that may affect Patient Identification Verified: Yes risk of falls: Secondary Verification Process Completed: Yes Signs or symptoms of abuse/neglect since last visito No Patient Requires Transmission-Based No Hospitalized since last visit: No Precautions: Implantable device outside of the clinic excluding No Patient Has Alerts: Yes cellular tissue based products placed in the center since last visit: Pain Present Now: No Electronic Signature(s) Signed: 09/28/2017 4:43:15 PM By: Roger Shelter Entered By: Roger Shelter on 09/28/2017 09:59:31 [name] ([id number]) -------------------------------------------------------------------------------- Encounter Discharge Information Details Patient Name: [name] Date of Service: 09/28/2017 10:00 AM Medical Record Number: [id number] Patient Account Number: [id number] Date of Birth/Sex: Oct 22, 1945 (72 y.o. M) Treating RN: Ahmed Prima Primary Care Ivyanna Sibert: Thomes Lolling Other Clinician: Referring Keaisha Sublette: Thomes Lolling Treating Catricia Scheerer/Extender: Sharalyn Ink in Treatment: 84 Encounter Discharge Information Items Discharge Condition:  Stable Ambulatory Status: Ambulatory Discharge Destination: Home Transportation: Private Auto Accompanied By: self Schedule Follow-up Appointment: Yes Clinical Summary of Care: Electronic Signature(s) Signed: 09/28/2017 10:37:02 AM By: Alric Quan Entered By: Alric Quan on 09/28/2017 10:37:02 [name] ([id number]) -------------------------------------------------------------------------------- Lower Extremity Assessment Details Patient Name: [name] Date of Service: 09/28/2017 10:00 AM Medical Record Number: [id number] Patient Account Number: [id number] Date of Birth/Sex: 28-Dec-1945 (72 y.o. M) Treating RN: Roger Shelter Primary Care Giuliano Preece: Thomes Lolling Other Clinician: Referring Cailyn Houdek: Thomes Lolling Treating Chancy Claros/Extender: Melburn Hake, HOYT Weeks in Treatment: 18 Electronic Signature(s) Signed: 09/28/2017 4:43:15 PM By: Roger Shelter Entered By: Roger Shelter on 09/28/2017 10:06:32 [name] ([id number]) -------------------------------------------------------------------------------- Multi Wound Chart Details Patient Name: [name] Date of Service: 09/28/2017 10:00 AM Medical Record Number: [id number] Patient Account Number: [id number] Date of Birth/Sex: 1945/03/24 (72 y.o. M) Treating RN: Montey Hora Primary Care Windie Marasco: Thomes Lolling Other Clinician: Referring Pier Laux: Thomes Lolling Treating Kassi Esteve/Extender: Melburn Hake, HOYT Weeks in Treatment: 64 Vital Signs Height(in): 65 Pulse(bpm): Weight(lbs): 161 Blood Pressure(mmHg): Body Mass Index(BMI): 27 Temperature(F): 97.8 Respiratory Rate 16 (breaths/min): Photos: [2:No Photos] [N/A:N/A] Wound Location: [2:Left Abdomen - midline] [N/A:N/A] Wounding Event: [2:Gradually Appeared] [N/A:N/A] Primary Etiology: [2:Atypical] [N/A:N/A] Comorbid History: [2:Cataracts, Chronic Obstructive N/A Pulmonary Disease (COPD), Hypertension, Type II Diabetes, Osteoarthritis,  Neuropathy] Date Acquired: [2:05/17/2015] [N/A:N/A] Weeks of Treatment: [2:64] [N/A:N/A] Wound Status: [2:Open] [N/A:N/A] Clustered Wound: [2:Yes] [N/A:N/A] Clustered Quantity: [2:1] [N/A:N/A] Measurements L x W x D [2:0.4x0.3x0.3] [N/A:N/A] (cm) Area (cm) : [2:0.094] [N/A:N/A] Volume (cm) : [2:0.028] [N/A:N/A] % Reduction in Area: [2:98.20%] [N/A:N/A] % Reduction in Volume: [2:94.70%] [N/A:N/A] Classification: [2:Full Thickness Without Exposed Support Structures] [N/A:N/A] Exudate Amount: [2:Medium] [N/A:N/A] Exudate Type: [2:Serous] [N/A:N/A] Exudate Color: [2:amber] [N/A:N/A] Wound Margin: [2:Flat and Intact] [N/A:N/A] Granulation Amount: [2:Large (67-100%)] [N/A:N/A] Granulation Quality: [2:Red] [N/A:N/A] Necrotic Amount: [2:Small (1-33%)] [N/A:N/A] Exposed Structures: [2:Fascia: No Fat Layer (Subcutaneous Tissue) Exposed: No Tendon: No Muscle: No Joint: No Bone: No Limited to Skin Breakdown] [N/A:N/A] Epithelialization: Medium (34-66%) N/A  N/A Periwound Skin Texture: Induration: Yes N/A N/A Scarring: Yes Excoriation: No Callus: No Crepitus: No Rash: No Periwound Skin Moisture: Maceration: No N/A N/A Dry/Scaly: No Periwound Skin Color: Atrophie Blanche: No N/A N/A Cyanosis: No Ecchymosis: No Erythema: No Hemosiderin Staining: No Mottled: No Pallor: No Rubor: No Temperature: No Abnormality N/A N/A Tenderness on Palpation: No N/A N/A Wound Preparation: Ulcer Cleansing: N/A N/A Rinsed/Irrigated with Saline Topical Anesthetic Applied: None Treatment Notes Electronic Signature(s) Signed: 10/01/2017 2:22:32 PM By: Montey Hora Entered By: Montey Hora on 09/28/2017 10:25:42 [name] ([id number]) -------------------------------------------------------------------------------- Multi-Disciplinary Care Plan Details Patient Name: [name] Date of Service: 09/28/2017 10:00 AM Medical Record Number: [id number] Patient Account Number: [id number] Date  of Birth/Sex: 05/02/1945 (72 y.o. M) Treating RN: Montey Hora Primary Care Aaro Meyers: Thomes Lolling Other Clinician: Referring Kameran Lallier: Thomes Lolling Treating Pama Roskos/Extender: Melburn Hake, HOYT Weeks in Treatment: 92 Active Inactive ` Abuse / Safety / Falls / Self Care Management Nursing Diagnoses: Impaired physical mobility Goals: Patient will remain injury free Date Initiated: 07/03/2016 Target Resolution Date: 09/14/2016 Goal Status: Active Interventions: Assess fall risk on admission and as needed Notes: ` Orientation to the Wound Care Program Nursing Diagnoses: Knowledge deficit related to the wound healing center program Goals: Patient/caregiver will verbalize understanding of the [place] Date Initiated: 07/03/2016 Target Resolution Date: 09/15/2016 Goal Status: Active Interventions: Provide education on orientation to the wound center Notes: ` Wound/Skin Impairment Nursing Diagnoses: Impaired tissue integrity Goals: Patient/caregiver will verbalize understanding of skin care regimen Date Initiated: 07/03/2016 Target Resolution Date: 09/15/2016 Goal Status: Active Ulcer/skin breakdown will have a volume reduction of 30% by week 4 Date Initiated: 07/03/2016 Target Resolution Date: 09/15/2016 [name], [name] ([id number]) Goal Status: Active Ulcer/skin breakdown will have a volume reduction of 50% by week 8 Date Initiated: 07/03/2016 Target Resolution Date: 09/15/2016 Goal Status: Active Ulcer/skin breakdown will have a volume reduction of 80% by week 12 Date Initiated: 07/03/2016 Target Resolution Date: 09/15/2016 Goal Status: Active Ulcer/skin breakdown will heal within 14 weeks Date Initiated: 07/03/2016 Target Resolution Date: 09/15/2016 Goal Status: Active Interventions: Assess patient/caregiver ability to obtain necessary supplies Assess patient/caregiver ability to perform ulcer/skin care regimen upon admission and as needed Assess ulceration(s)  every visit Notes: Electronic Signature(s) Signed: 10/01/2017 2:22:32 PM By: Montey Hora Entered By: Montey Hora on 09/28/2017 10:25:33 [name] ([id number]) -------------------------------------------------------------------------------- Pain Assessment Details Patient Name: [name] Date of Service: 09/28/2017 10:00 AM Medical Record Number: [id number] Patient Account Number: [id number] Date of Birth/Sex: 1946/02/29 (72 y.o. M) Treating RN: Roger Shelter Primary Care Prather Failla: Thomes Lolling Other Clinician: Referring Dhana Totton: Thomes Lolling Treating Loza Prell/Extender: Melburn Hake, HOYT Weeks in Treatment: 37 Active Problems Location of Pain Severity and Description of Pain Patient Has Paino No Site Locations Pain Management and Medication Current Pain Management: Electronic Signature(s) Signed: 09/28/2017 4:43:15 PM By: Roger Shelter Entered By: Roger Shelter on 09/28/2017 09:59:44 [name] ([id number]) -------------------------------------------------------------------------------- Patient/Caregiver Education Details Patient Name: [name] Date of Service: 09/28/2017 10:00 AM Medical Record Number: [id number] Patient Account Number: [id number] Date of Birth/Gender: 1945/12/30 (71 y.o. M) Treating RN: Ahmed Prima Primary Care Physician: Thomes Lolling Other Clinician: Referring Physician: Thomes Lolling Treating Physician/Extender: Sharalyn Ink in Treatment: 48 Education Assessment Education Provided To: Patient Education Topics Provided Wound/Skin Impairment: Handouts: Caring for Your Ulcer, Skin Care Do's and Dont's, Other: change dressing as ordered Methods: Demonstration, Explain/Verbal Responses: State content correctly Electronic Signature(s) Signed: 10/01/2017 5:20:31 PM By: Alric Quan Entered By:  Alric Quan on 09/28/2017 10:37:25 [name], [name]  ([id number]) -------------------------------------------------------------------------------- Wound Assessment Details Patient Name: [name], [name]. Date of Service: 09/28/2017 10:00 AM Medical Record Number: [id number] Patient Account Number: [id number] Date of Birth/Sex: 1945/05/08 (72 y.o. M) Treating RN: Roger Shelter Primary Care Velencia Lenart: Thomes Lolling Other Clinician: Referring Dirck Butch: Thomes Lolling Treating Svara Twyman/Extender: Melburn Hake, HOYT Weeks in Treatment: 29 Wound Status Wound Number: 2 Primary Atypical Etiology: Wound Location: Left Abdomen - midline Wound Open Wounding Event: Gradually Appeared Status: Date Acquired: 05/17/2015 Comorbid Cataracts, Chronic Obstructive Pulmonary Weeks Of Treatment: 64 History: Disease (COPD), Hypertension, Type II Clustered Wound: Yes Diabetes, Osteoarthritis, Neuropathy Photos Photo Uploaded By: Roger Shelter on 09/28/2017 16:33:04 Wound Measurements Length: (cm) 0.4 Width: (cm) 0.3 Depth: (cm) 0.3 Clustered Quantity: 1 Area: (cm) 0.094 Volume: (cm) 0.028 % Reduction in Area: 98.2% % Reduction in Volume: 94.7% Epithelialization: Medium (34-66%) Tunneling: No Undermining: No Wound Description Full Thickness Without Exposed Support Classification: Structures Wound Margin: Flat and Intact Exudate Medium Amount: Exudate Type: Serous Exudate Color: amber Foul Odor After Cleansing: No Slough/Fibrino No Wound Bed Granulation Amount: Large (67-100%) Exposed Structure Granulation Quality: Red Fascia Exposed: No Necrotic Amount: Small (1-33%) Fat Layer (Subcutaneous Tissue) Exposed: No Necrotic Quality: Adherent Slough Tendon Exposed: No Muscle Exposed: No Joint Exposed: No [name], [name] ([id number]) Bone Exposed: No Limited to Skin Breakdown Periwound Skin Texture Texture Color No Abnormalities Noted: No No Abnormalities Noted: No Callus: No Atrophie Blanche: No Crepitus: No Cyanosis:  No Excoriation: No Ecchymosis: No Induration: Yes Erythema: No Rash: No Hemosiderin Staining: No Scarring: Yes Mottled: No Pallor: No Moisture Rubor: No No Abnormalities Noted: No Dry / Scaly: No Temperature / Pain Maceration: No Temperature: No Abnormality Wound Preparation Ulcer Cleansing: Rinsed/Irrigated with Saline Topical Anesthetic Applied: None Treatment Notes Wound #2 (Left Abdomen - midline) 1. Cleansed with: Clean wound with Normal Saline 2. Anesthetic Topical Lidocaine 4% cream to wound bed prior to debridement 3. Peri-wound Care: Skin Prep 4. Dressing Applied: Prisma Ag 5. Secondary [place] Signature(s) Signed: 09/28/2017 4:43:15 PM By: Roger Shelter Entered By: Roger Shelter on 09/28/2017 10:06:10 [name] ([id number]) -------------------------------------------------------------------------------- [place] Details Patient Name: [name] Date of Service: 09/28/2017 10:00 AM Medical Record Number: [id number] Patient Account Number: [id number] Date of Birth/Sex: [birth year] (72 y.o. M) Treating RN: Roger Shelter Primary Care Brena Windsor: Thomes Lolling Other Clinician: Referring Israa Caban: Thomes Lolling Treating Sayyid Harewood/Extender: Melburn Hake, HOYT Weeks in Treatment: 43 Vital Signs Time Taken: 09:59 Temperature (F): 97.8 Height (in): 65 Respiratory Rate (breaths/min): 16 Weight (lbs): 161 Reference Range: 80 - 120 mg / dl Body Mass Index (BMI): 26.8 Electronic Signature(s) Signed: 09/28/2017 4:43:15 PM By: Roger Shelter Entered By: Roger Shelter on 09/28/2017 10:01:04

## 2017-10-12 ENCOUNTER — Encounter: Payer: Medicare HMO | Attending: Physician Assistant | Admitting: Physician Assistant

## 2017-10-12 DIAGNOSIS — Z09 Encounter for follow-up examination after completed treatment for conditions other than malignant neoplasm: Secondary | ICD-10-CM | POA: Insufficient documentation

## 2017-10-12 DIAGNOSIS — Z8631 Personal history of diabetic foot ulcer: Secondary | ICD-10-CM | POA: Insufficient documentation

## 2017-10-12 DIAGNOSIS — J449 Chronic obstructive pulmonary disease, unspecified: Secondary | ICD-10-CM | POA: Insufficient documentation

## 2017-10-12 DIAGNOSIS — F17220 Nicotine dependence, chewing tobacco, uncomplicated: Secondary | ICD-10-CM | POA: Diagnosis not present

## 2017-10-12 DIAGNOSIS — I10 Essential (primary) hypertension: Secondary | ICD-10-CM | POA: Diagnosis not present

## 2017-10-12 DIAGNOSIS — E1142 Type 2 diabetes mellitus with diabetic polyneuropathy: Secondary | ICD-10-CM | POA: Insufficient documentation

## 2017-10-22 NOTE — Progress Notes (Signed)
[name] ([id number]) Visit Report for 10/12/2017 Chief Complaint Document Details Patient Name: [name], [name]. Date of Service: 10/12/2017 10:00 AM Medical Record Number: [id number] Patient Account Number: [id number] Date of Birth/Sex: 04-Apr-1945 (72 y.o. M) Treating RN: Montey Hora Primary Care Provider: Thomes Lolling Other Clinician: Referring Provider: Thomes Lolling Treating Provider/Extender: Melburn Hake, HOYT Weeks in Treatment: 31 Information Obtained from: Patient Chief Complaint anterior abdominal wall in the epigastric region Electronic Signature(s) Signed: 10/15/2017 1:43:31 AM By: Worthy Keeler PA-C Entered By: Worthy Keeler on 10/12/2017 10:27:34 [name] ([id number]) -------------------------------------------------------------------------------- HPI Details Patient Name: [name] Date of Service: 10/12/2017 10:00 AM Medical Record Number: [id number] Patient Account Number: [id number] Date of Birth/Sex: 06-26-1945 (72 y.o. M) Treating RN: Montey Hora Primary Care Provider: Thomes Lolling Other Clinician: Referring Provider: Thomes Lolling Treating Provider/Extender: Melburn Hake, HOYT Weeks in Treatment: 27 History of Present Illness HPI Description: 72 year old gentleman has been seen recently by his PCP Dr. Thomes Lolling, who sees him with a history of diabetes mellitus, peripheral neuropathy, B12 deficiency, hypertension, osteoarthritis and the patient had recent blood sugar checked which was 163. Past medical history includes essential hypertension, incisional hernia, diabetes mellitus, colon polyps, COPD, tobacco abuse in the past, chewing tobacco, GERD. He has also been treated for chronic back pain with no sciatica and is on oxycodone. His last hemoglobin A1c was 6.9% His abdominal surgery was done at [facility] over 15 years ago and gradually there was sutures which protruded and then there was mesh which protruded. He washes this during his  shower but does not put any dressing and the wound is covered with a lot of debris. He has never had a surgical consultation for this. 07/10/2016 -- the patient has spoken to his PCP who said he was going to call me but I have not yet had a phone conversation with the physician. From what I understand the physician was reluctant to have a surgical opinion because of the patient's several comorbidities and he feels that the surgery may be detrimental to his overall health 07/24/2016 -- the patient's daughter is at the bedside and we've had a detailed discussion regarding his options for a surgical opinion and possible surgical intervention before this becomes a emergent situation. She will seek primary care input and take the father to [facility] for a surgical opinion. 07/31/2016 -- the patient's daughter has organized for a surgical opinion at [facility] on June 14 08/14/2016 -- it has been about 3 weeks since he has stopped chewing tobacco. 08/28/2016 -- he was seen at [facility] by Dr. Roby Lofts -- the assessment was based on physical examination and review of his previous history of surgical repair. the patient had undergone a ventral hernia repair in 1998 with Dr. Quay Burow at [facility] at a prior open cholecystectomy incision. he was evaluated by Dr. Denice Paradise in 2013 and at that time it was decided to watch and wait. Dr. Payton Doughty recommended a repeat CT scan and requested records from dura and regional operative records and would follow-up the patient, after Mr. Zwahlen PCP Dr. Gwenlyn Saran reviewed him for overall fitness for surgery. 09/04/16 patient's wound actually appear to be doing well on evaluation today. He is not really experience any discomfort and the right abdominal wound is dry and almost appears to be healing up. He continues to have some drainage from the left but this seems to be more of a potential seroma which with milking is able to  be cleared out. I discussed with  patient that when he performs his dressing changes at home it would be beneficial for him to do this as well. Otherwise there's no evidence of infection. He is still waiting on his CT scan and appointments to determine whether he is going to have another surgery. 09/11/2016 -- the patient has had a CT scan and is awaiting his surgical appointment prior to deciding on any surgical intervention. 09/25/16 patient presents today for fault evaluation concerning his ongoing abdominal surgical wounds. Unfortunately they do not appear to be improving significantly but rather fluctuate from a little better to little worse week by week. Obviously this is somewhat frustrating for him. 10/02/2016 -- the patient has completed his CT scan, and has a review by the surgeons at the end of August. He has a new area which has opened up a little below his left abdominal wound. 10/16/16 on evaluation today patient's wounds on the abdominal region appears to be doing about the same. He actually has an appointment with his surgeon on the 21st for evaluation regarding the required products appointment. Obviously he is having a difficult time with the feeling of these wounds. Fortunately there does not appear to be any evidence of local infection such as significant amount of erythema or streaking. She also has an audio, vomiting, or diarrhea. He is not have any discomfort at this point. [name], [name] ([id number]) 10/30/2016 -- the surgical review is tomorrow and I am awaiting the input of the surgeon. 11/16/2016 -- I had a call from his surgeon Dr. Payton Doughty, on phone number [phone] who was kind enough to call me regarding his care. After a thorough review and having consulted the abdominal wall reconstruction team they have decided that surgery will be too extensive and rather moribund for this gentleman and have asked him to continue with local care at the wound center. She will send me an official note via  Epic. 03/09/2017 -- the patient comes for some abdominal wall wounds which were caused by protruding mesh and complete surgical review was done at [facility] and no surgery has been recommended. He continues to get palliative care and the right abdominal wound is completely healed. He has a few open spots on the left which are being treated with palliative care and we see him once a month 05/04/17 on evaluation today Mr. Burich presents for follow-up concerning his abdominal surgical ulcer. He does have some additional mashed taking out of the wound at this point although he has made a lot of progress since last time I saw him. There is good epithelialization in a lot of areas although there is still mesh poking through which is preventing complete closure. Fortunately there does not appear to be any evidence of infection. 05/18/17 on evaluation today patient appears to be doing very well in regard to his Adamo ulcer. Of the two areas that were previously open one has completely close in the proximal portion of the wound and the remainder of the opening which is the inferior portion is significantly smaller and there does not appear to be a lot of significant issue with mesh poking through the skin currently. In fact this is a nice rather circular indention where he has granulation in the base. There was some of the mesh still showing through but very little. Overall he has made significant improvements in my opinion since even two weeks ago when I saw him. 06/22/17 on evaluation today patient's abdominal ulcer  on inspection appears to be doing much better. He still has 2 tiny openings at either end of the surgical site where he has scar tissue noted at this point. Fortunately there does not appear to be any evidence of infection which is good news. He does still have some evidence of the mesh showing which still seems to be preventing this from fully closing but fortunately this seems to be  minimal even compared to my last evaluation. Overall he has been doing very well. 07/06/17 on evaluation today patient's abdominal ulcer actually appears to be doing much better. He's showing signs of improvement little by little. Overall I feel this is appropriate as far as the progression that has been made. With that being said it's still been a very slow process. There's no evidence of infection. 07/20/17 on evaluation today patient appears to be doing rather well in regard to his left abdominal ulcer. He has been tolerating the dressing changes without complication fortunately there does not appear to be any evidence of infection. He still has two small openings although both of these appear to have filled in much more nicely compared to last week's evaluation. There is no obvious exposed mesh at this point. 08/03/17 on evaluation today the patient's wound on the abdominal region appears to be doing excellent. In fact the upper portion which was still open show signs of being completely epithelial eyes which is good news. He still does have a small opening on the inferior portion but overall things have progressed extremely well in my opinion. Overall I'm hoping that she will see this completely close over the next couple of weeks. 08/17/17-He is here for preparation for an abdominal wound, there are 2 areas without complete epithelialization; the proximal area is fluid-filled and hyper granular, treated with silver nitrate, the distal aspect has a thin crusted layer of epithelium. There is no erythema to indicate infection. We will change treatment plan and he will follow-up next week 08/24/17 on evaluation today patient actually appears to be doing very well in regard to his abdominal ulcer. Unfortunately the little area that had closed previous when I saw him actually reopened when Denny Peon saw him last week. Subsequently the lower portion seems to have almost completely resolved which is good news  in fact it may be closed even at this point. Nonetheless overall I'm very pleased with the progress that has been made up to this point. 08/31/17 on evaluation today patient's wound actually appears to be about the same as what I saw a previous. He does not seem to be showing any signs of worsening although there's no signs of necessarily improving eider. It does appear that the patient may actually have a little bit of cellulitis in the surrounding scar tissue region where the two small openings are at this point. I look back at pictures from prior weeks and it looks like that slowly this has gotten a little bit more erythematous and I've kind of been watching this but it's definite today compared to previous. For this reason I feel he made the at the point where antibiotic therapy would be beneficial for him. [name], [name] ([id number]) 09/07/17 on evaluation today patient appears to be doing very well. In fact he has just what may be a very small opening at this point in the abdominal region. Nonetheless I'm not even 100% sure this is a completely close. He's been tolerating the dressing changes without complication I do believe the antibiotic has been extremely  helpful for him. In fact I think that was the biggest issue he was having with why these areas had been closed up last week. Overall I'm very happy with the appearance today. 09/28/17 on evaluation today patient actually appears to be doing very well in regard to the inferior portion of his abdominal wound. He has been tolerating the dressing changes without complication. Unfortunately in the superior region he does have an area that open that was not as open dear in the last visit. Nonetheless this does not appear to be infected I feel like in general everything is doing well in that regard at least. Nonetheless he is somewhat frustrated he was really hoping I do believe that he would have this completely healed at this point. 10/12/17 on  evaluation today patient's wound actually appears to be completely healed which is excellent news. He has been having no discomfort and he is extremely happy to find that this is close. He states that has been no drainage noted for the past several days which is also good news. Overall I'm very pleased in this regard. Electronic Signature(s) Signed: 10/15/2017 1:43:31 AM By: Worthy Keeler PA-C Entered By: Worthy Keeler on 10/12/2017 13:55:59 [name] ([id number]) -------------------------------------------------------------------------------- Physical Exam Details Patient Name: LIBERATO, STANSBERY Date of Service: 10/12/2017 10:00 AM Medical Record Number: [id number] Patient Account Number: [id number] Date of Birth/Sex: 27-Jan-1946 (72 y.o. M) Treating RN: Montey Hora Primary Care Provider: Thomes Lolling Other Clinician: Referring Provider: Thomes Lolling Treating Provider/Extender: Melburn Hake, HOYT Weeks in Treatment: 75 Constitutional Well-nourished and well-hydrated in no acute distress. Respiratory normal breathing without difficulty. Psychiatric this patient is able to make decisions and demonstrates good insight into disease process. Alert and Oriented x 3. pleasant and cooperative. Notes At this point again I see no opening during evaluation that would have me concerned about anything more significant going on. The subsea is good news. The patient is very pleased to see and hear this. Electronic Signature(s) Signed: 10/15/2017 1:43:31 AM By: Worthy Keeler PA-C Entered By: Worthy Keeler on 10/12/2017 13:57:14 [name] ([id number]) -------------------------------------------------------------------------------- Physician Orders Details Patient Name: DAMARIOUS, HOLTSCLAW Date of Service: 10/12/2017 10:00 AM Medical Record Number: [id number] Patient Account Number: [id number] Date of Birth/Sex: 04/28/1945 (72 y.o. M) Treating RN: Montey Hora Primary Care Provider:  Thomes Lolling Other Clinician: Referring Provider: Thomes Lolling Treating Provider/Extender: Sharalyn Ink in Treatment: 32 Verbal / Phone Orders: No Diagnosis Coding ICD-10 Coding Code Description E11.622 Type 2 diabetes mellitus with other skin ulcer Unspecified open wound of abdominal wall, epigastric region without penetration into peritoneal cavity, S31.102S sequela F17.228 Nicotine dependence, chewing tobacco, with other nicotine-induced disorders Discharge From [facility] o Discharge from [facility] [place] Signature(s) Signed: 10/12/2017 5:18:28 PM By: Montey Hora Signed: 10/15/2017 1:43:31 AM By: Worthy Keeler PA-C Entered By: Montey Hora on 10/12/2017 11:32:38 [name] ([id number]) -------------------------------------------------------------------------------- Problem List Details Patient Name: [name] Date of Service: 10/12/2017 10:00 AM Medical Record Number: [id number] Patient Account Number: [id number] Date of Birth/Sex: 04/11/1945 (72 y.o. M) Treating RN: Montey Hora Primary Care Provider: Thomes Lolling Other Clinician: Referring Provider: Thomes Lolling Treating Provider/Extender: Sharalyn Ink in Treatment: 13 Active Problems ICD-10 Evaluated Encounter Code Description Active Date Today Diagnosis E11.622 Type 2 diabetes mellitus with other skin ulcer 07/03/2016 No Yes S31.102S Unspecified open wound of abdominal wall, epigastric region 07/03/2016 No Yes without penetration into peritoneal cavity, sequela F17.228 Nicotine dependence, chewing tobacco,  with other nicotine- 07/03/2016 No Yes induced disorders Inactive Problems Resolved Problems ICD-10 Code Description Active Date Resolved Date S31.100A Unspecified open wound of abdominal wall, right upper quadrant 07/03/2016 07/03/2016 without penetration into peritoneal cavity, initial encounter Electronic Signature(s) Signed: 10/15/2017 1:43:31 AM By: Worthy Keeler PA-C Entered By: Worthy Keeler on 10/12/2017 10:14:18 [name] ([id number]) -------------------------------------------------------------------------------- Progress Note Details Patient Name: [name] Date of Service: 10/12/2017 10:00 AM Medical Record Number: [id number] Patient Account Number: [id number] Date of Birth/Sex: Jul 25, 1945 (72 y.o. M) Treating RN: Montey Hora Primary Care Provider: Thomes Lolling Other Clinician: Referring Provider: Thomes Lolling Treating Provider/Extender: Sharalyn Ink in Treatment: 47 Subjective Chief Complaint Information obtained from Patient anterior abdominal wall in the epigastric region History of Present Illness (HPI) 72 year old gentleman has been seen recently by his PCP Dr. Thomes Lolling, who sees him with a history of diabetes mellitus, peripheral neuropathy, B12 deficiency, hypertension, osteoarthritis and the patient had recent blood sugar checked which was 163. Past medical history includes essential hypertension, incisional hernia, diabetes mellitus, colon polyps, COPD, tobacco abuse in the past, chewing tobacco, GERD. He has also been treated for chronic back pain with no sciatica and is on oxycodone. His last hemoglobin A1c was 6.9% His abdominal surgery was done at [facility] over 15 years ago and gradually there was sutures which protruded and then there was mesh which protruded. He washes this during his shower but does not put any dressing and the wound is covered with a lot of debris. He has never had a surgical consultation for this. 07/10/2016 -- the patient has spoken to his PCP who said he was going to call me but I have not yet had a phone conversation with the physician. From what I understand the physician was reluctant to have a surgical opinion because of the patient's several comorbidities and he feels that the surgery may be detrimental to his overall health 07/24/2016 -- the patient's daughter  is at the bedside and we've had a detailed discussion regarding his options for a surgical opinion and possible surgical intervention before this becomes a emergent situation. She will seek primary care input and take the father to [facility] for a surgical opinion. 07/31/2016 -- the patient's daughter has organized for a surgical opinion at [facility] on June 14 08/14/2016 -- it has been about 3 weeks since he has stopped chewing tobacco. 08/28/2016 -- he was seen at [facility] by Dr. Roby Lofts -- the assessment was based on physical examination and review of his previous history of surgical repair. the patient had undergone a ventral hernia repair in 1998 with Dr. Quay Burow at [facility] at a prior open cholecystectomy incision. he was evaluated by Dr. Denice Paradise in 2013 and at that time it was decided to watch and wait. Dr. Payton Doughty recommended a repeat CT scan and requested records from dura and regional operative records and would follow-up the patient, after Mr. Holzheimer PCP Dr. Gwenlyn Saran reviewed him for overall fitness for surgery. 09/04/16 patient's wound actually appear to be doing well on evaluation today. He is not really experience any discomfort and the right abdominal wound is dry and almost appears to be healing up. He continues to have some drainage from the left but this seems to be more of a potential seroma which with milking is able to be cleared out. I discussed with patient that when he performs his dressing changes at home it would be beneficial for him  to do this as well. Otherwise there's no evidence of infection. He is still waiting on his CT scan and appointments to determine whether he is going to have another surgery. 09/11/2016 -- the patient has had a CT scan and is awaiting his surgical appointment prior to deciding on any surgical intervention. 09/25/16 patient presents today for fault evaluation concerning his ongoing abdominal surgical wounds. Unfortunately they  do not appear to be improving significantly but rather fluctuate from a little better to little worse week by week. Obviously this is somewhat frustrating for him. 10/02/2016 -- the patient has completed his CT scan, and has a review by the surgeons at the end of August. He has a new area which has opened up a little below his left abdominal wound. [name], [name] ([id number]) 10/16/16 on evaluation today patient's wounds on the abdominal region appears to be doing about the same. He actually has an appointment with his surgeon on the 21st for evaluation regarding the required products appointment. Obviously he is having a difficult time with the feeling of these wounds. Fortunately there does not appear to be any evidence of local infection such as significant amount of erythema or streaking. She also has an audio, vomiting, or diarrhea. He is not have any discomfort at this point. 10/30/2016 -- the surgical review is tomorrow and I am awaiting the input of the surgeon. 11/16/2016 -- I had a call from his surgeon Dr. Payton Doughty, on phone number [phone] who was kind enough to call me regarding his care. After a thorough review and having consulted the abdominal wall reconstruction team they have decided that surgery will be too extensive and rather moribund for this gentleman and have asked him to continue with local care at the wound center. She will send me an official note via Epic. 03/09/2017 -- the patient comes for some abdominal wall wounds which were caused by protruding mesh and complete surgical review was done at [facility] and no surgery has been recommended. He continues to get palliative care and the right abdominal wound is completely healed. He has a few open spots on the left which are being treated with palliative care and we see him once a month 05/04/17 on evaluation today Mr. Hylton presents for follow-up concerning his abdominal surgical ulcer. He does have  some additional mashed taking out of the wound at this point although he has made a lot of progress since last time I saw him. There is good epithelialization in a lot of areas although there is still mesh poking through which is preventing complete closure. Fortunately there does not appear to be any evidence of infection. 05/18/17 on evaluation today patient appears to be doing very well in regard to his Adamo ulcer. Of the two areas that were previously open one has completely close in the proximal portion of the wound and the remainder of the opening which is the inferior portion is significantly smaller and there does not appear to be a lot of significant issue with mesh poking through the skin currently. In fact this is a nice rather circular indention where he has granulation in the base. There was some of the mesh still showing through but very little. Overall he has made significant improvements in my opinion since even two weeks ago when I saw him. 06/22/17 on evaluation today patient's abdominal ulcer on inspection appears to be doing much better. He still has 2 tiny openings at either end of the surgical site where  he has scar tissue noted at this point. Fortunately there does not appear to be any evidence of infection which is good news. He does still have some evidence of the mesh showing which still seems to be preventing this from fully closing but fortunately this seems to be minimal even compared to my last evaluation. Overall he has been doing very well. 07/06/17 on evaluation today patient's abdominal ulcer actually appears to be doing much better. He's showing signs of improvement little by little. Overall I feel this is appropriate as far as the progression that has been made. With that being said it's still been a very slow process. There's no evidence of infection. 07/20/17 on evaluation today patient appears to be doing rather well in regard to his left abdominal ulcer. He has  been tolerating the dressing changes without complication fortunately there does not appear to be any evidence of infection. He still has two small openings although both of these appear to have filled in much more nicely compared to last week's evaluation. There is no obvious exposed mesh at this point. 08/03/17 on evaluation today the patient's wound on the abdominal region appears to be doing excellent. In fact the upper portion which was still open show signs of being completely epithelial eyes which is good news. He still does have a small opening on the inferior portion but overall things have progressed extremely well in my opinion. Overall I'm hoping that she will see this completely close over the next couple of weeks. 08/17/17-He is here for preparation for an abdominal wound, there are 2 areas without complete epithelialization; the proximal area is fluid-filled and hyper granular, treated with silver nitrate, the distal aspect has a thin crusted layer of epithelium. There is no erythema to indicate infection. We will change treatment plan and he will follow-up next week 08/24/17 on evaluation today patient actually appears to be doing very well in regard to his abdominal ulcer. Unfortunately the little area that had closed previous when I saw him actually reopened when Denny Peon saw him last week. Subsequently the lower portion seems to have almost completely resolved which is good news in fact it may be closed even at this point. Nonetheless overall I'm very pleased with the progress that has been made up to this point. 08/31/17 on evaluation today patient's wound actually appears to be about the same as what I saw a previous. He does not [name], [name]. ([id number]) seem to be showing any signs of worsening although there's no signs of necessarily improving eider. It does appear that the patient may actually have a little bit of cellulitis in the surrounding scar tissue region where the two  small openings are at this point. I look back at pictures from prior weeks and it looks like that slowly this has gotten a little bit more erythematous and I've kind of been watching this but it's definite today compared to previous. For this reason I feel he made the at the point where antibiotic therapy would be beneficial for him. 09/07/17 on evaluation today patient appears to be doing very well. In fact he has just what may be a very small opening at this point in the abdominal region. Nonetheless I'm not even 100% sure this is a completely close. He's been tolerating the dressing changes without complication I do believe the antibiotic has been extremely helpful for him. In fact I think that was the biggest issue he was having with why these areas had been closed  up last week. Overall I'm very happy with the appearance today. 09/28/17 on evaluation today patient actually appears to be doing very well in regard to the inferior portion of his abdominal wound. He has been tolerating the dressing changes without complication. Unfortunately in the superior region he does have an area that open that was not as open dear in the last visit. Nonetheless this does not appear to be infected I feel like in general everything is doing well in that regard at least. Nonetheless he is somewhat frustrated he was really hoping I do believe that he would have this completely healed at this point. 10/12/17 on evaluation today patient's wound actually appears to be completely healed which is excellent news. He has been having no discomfort and he is extremely happy to find that this is close. He states that has been no drainage noted for the past several days which is also good news. Overall I'm very pleased in this regard. Patient History Information obtained from Patient. Social History Former smoker - chew tobacco, Marital Status - Widowed, Alcohol Use - Never, Drug Use - No History, Caffeine Use -  Daily. 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 Respiratory The patient has no complaints or symptoms. Cardiovascular The patient has no complaints or symptoms. Psychiatric The patient has no complaints or symptoms. Objective Constitutional Well-nourished and well-hydrated in no acute distress. Vitals Time Taken: 10:30 AM, Height: 65 in, Weight: 161 lbs, BMI: 26.8, Temperature: 98.2 F, Pulse: 70 bpm, Respiratory Rate: 16 breaths/min, Blood Pressure: 145/69 mmHg. [name], [name] ([id number]) Respiratory normal breathing without difficulty. Psychiatric this patient is able to make decisions and demonstrates good insight into disease process. Alert and Oriented x 3. pleasant and cooperative. General Notes: At this point again I see no opening during evaluation that would have me concerned about anything more significant going on. The subsea is good news. The patient is very pleased to see and hear this. Integumentary (Hair, Skin) Wound #2 status is Healed - Epithelialized. Original cause of wound was Gradually Appeared. The wound is located on the Left Abdomen - midline. The wound measures 0cm length x 0cm width x 0cm depth; 0cm^2 area and 0cm^3 volume. The wound is limited to skin breakdown. There is no tunneling or undermining noted. There is a none present amount of drainage noted. The wound margin is flat and intact. There is no granulation within the wound bed. There is a small (1-33%) amount of necrotic tissue within the wound bed including Adherent Slough. The periwound skin appearance exhibited: Scarring. The periwound skin appearance did not exhibit: Callus, Crepitus, Excoriation, Induration, Rash, Dry/Scaly, Maceration, Atrophie Blanche, Cyanosis, Ecchymosis, Hemosiderin Staining, Mottled, Pallor, Rubor, Erythema.  Periwound temperature was noted as No Abnormality. Assessment Active Problems ICD-10 Type 2 diabetes mellitus with other skin ulcer Unspecified open wound of abdominal wall, epigastric region without penetration into peritoneal cavity, sequela Nicotine dependence, chewing tobacco, with other nicotine-induced disorders Plan Discharge From [facility] Services: Discharge from [facility] suggest at this point that we discontinue wound care services as the patient again appears to be completely healed. Obviously if anything reopens or causes any problems he knows where we are and he can definitely come back for reevaluation as needed. With that being said I'm hopeful that this will not become necessary the patient likewise is hopeful as well. I  did recommend you keep this covered for the next two weeks order to ensure that it toughens up and hopefully will not reopen following. He's in agreement that plan. Electronic Signature(s) Signed: 10/15/2017 1:43:31 AM By: Worthy Keeler PA-C Entered By: Worthy Keeler on 10/12/2017 13:58:05 [name], [name] ([id number], [name] ([id number]) -------------------------------------------------------------------------------- ROS/PFSH Details Patient Name: [name], [name] Date of Service: 10/12/2017 10:00 AM Medical Record Number: [id number] Patient Account Number: [id number] Date of Birth/Sex: March 18, 1945 (72 y.o. M) Treating RN: Montey Hora Primary Care Provider: Thomes Lolling Other Clinician: Referring Provider: Thomes Lolling Treating Provider/Extender: Melburn Hake, HOYT Weeks in Treatment: 48 Information Obtained From Patient Wound History Do you currently have one or more open woundso Yes How many open wounds do you currently haveo 2 Approximately how long have you had your woundso more than 1 year How have you been treating your wound(s) until nowo open to air Has your wound(s) ever healed and then re-openedo No Have you had any  lab work done in the past montho No Have you tested positive for an antibiotic resistant organism (MRSA, VRE)o No Have you tested positive for osteomyelitis (bone infection)o No Have you had any tests for circulation on your legso No Constitutional Symptoms (General Health) Complaints and Symptoms: Negative for: Fever; Chills Eyes Medical History: Positive for: Cataracts - removed Hematologic/Lymphatic Medical History: Negative for: Anemia; Hemophilia; Human Immunodeficiency Virus; Lymphedema; Sickle Cell Disease Respiratory Complaints and Symptoms: No Complaints or Symptoms Medical History: Positive for: Chronic Obstructive Pulmonary Disease (COPD) Negative for: Aspiration; Asthma; Pneumothorax; Sleep Apnea; Tuberculosis Cardiovascular Complaints and Symptoms: No Complaints or Symptoms Medical History: Positive for: Hypertension Negative for: Angina; Arrhythmia; Congestive Heart Failure; Coronary Artery Disease; Deep Vein Thrombosis; Hypotension; Myocardial Infarction; Peripheral Arterial Disease; Peripheral Venous Disease; Phlebitis; Vasculitis Gastrointestinal [name], [name] ([id number]) Medical History: Negative for: Cirrhosis ; Colitis; Crohnos; Hepatitis A; Hepatitis B; Hepatitis C Endocrine Medical History: Positive for: Type II Diabetes Treated with: Oral agents Blood sugar tested every day: No Genitourinary Medical History: Negative for: End Stage Renal Disease Immunological Medical History: Negative for: Lupus Erythematosus; Raynaudos; Scleroderma Integumentary (Skin) Medical History: Negative for: History of Burn; History of pressure wounds Musculoskeletal Medical History: Positive for: Osteoarthritis Negative for: Gout; Rheumatoid Arthritis; Osteomyelitis Neurologic Medical History: Positive for: Neuropathy Negative for: Dementia; Quadriplegia; Paraplegia; Seizure Disorder Oncologic Medical History: Negative for: Received Chemotherapy; Received  Radiation Past Medical History Notes: 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 Psychiatric Complaints and Symptoms: No Complaints or Symptoms Medical History: Negative for: Anorexia/bulimia; Confinement Anxiety HBO Extended History Items Eyes: Cataracts Immunizations Pneumococcal Vaccine: [name], [name] ([id number]) Received Pneumococcal Vaccination: Yes Immunization Notes: up to date Implantable Devices Family and Social History Former smoker - chew tobacco; Marital Status - Widowed; Alcohol Use: Never; Drug Use: No History; Caffeine Use: Daily; Financial Concerns: No; Food, Clothing or Shelter Needs: No; Support System Lacking: No; Transportation Concerns: No; Advanced Directives: No; Patient does not want information on Advanced Directives Physician Affirmation I have reviewed and agree with the above information. Electronic Signature(s) Signed: 10/12/2017 5:18:28 PM By: Montey Hora Signed: 10/15/2017 1:43:31 AM By: Worthy Keeler PA-C Entered By: Worthy Keeler on 10/12/2017 13:56:30 [name], [name] ([id number]) -------------------------------------------------------------------------------- SuperBill Details Patient Name: [name] Date of Service: 10/12/2017 Medical Record Number: [id number] Patient Account Number: [id number] Date of Birth/Sex: [birth year] (72 y.o. M) Treating RN: Montey Hora Primary Care Provider: Thomes Lolling Other Clinician: Referring Provider: Thomes Lolling Treating Provider/Extender: Melburn Hake, HOYT  Weeks in Treatment: 66 Diagnosis Coding ICD-10 Codes Code Description E11.622 Type 2 diabetes mellitus with other skin ulcer Unspecified open wound of abdominal wall, epigastric region without penetration into peritoneal cavity, S31.102S sequela F17.228 Nicotine dependence, chewing tobacco, with other nicotine-induced disorders Facility Procedures CPT4 Code: [id number] Description: [phone] - WOUND CARE VISIT-LEV 2 EST  PT Modifier: Quantity: 1 Physician Procedures CPT4: Description Modifier Quantity Code [id number] - WC PHYS LEVEL 2 - EST PT 1 ICD-10 Diagnosis Description E11.622 Type 2 diabetes mellitus with other skin ulcer S31.102S Unspecified open wound of abdominal wall, epigastric region without  penetration into peritoneal cavity, sequela F17.228 Nicotine dependence, chewing tobacco, with other nicotine-induced disorders Electronic Signature(s) Signed: 10/15/2017 1:43:31 AM By: Worthy Keeler PA-C Entered By: Worthy Keeler on 10/12/2017 13:58:47

## 2017-10-22 NOTE — Progress Notes (Signed)
KATRINA, BROSH ([id number]) Visit Report for 10/12/2017 Arrival Information Details Patient Name: [name], [name]. Date of Service: 10/12/2017 10:00 AM Medical Record Number: [id number] Patient Account Number: [id number] Date of Birth/Sex: [birth year] (72 y.o. M) Treating RN: Secundino Ginger Primary Care Gracemarie Skeet: Thomes Lolling Other Clinician: Referring Marlis Oldaker: Thomes Lolling Treating Masiel Gentzler/Extender: Sharalyn Ink in Treatment: 44 Visit Information History Since Last Visit Added or deleted any medications: No Patient Arrived: Ambulatory Any new allergies or adverse reactions: No Arrival Time: 10:34 Had a fall or experienced change in No Accompanied By: self activities of daily living that may affect Transfer Assistance: None risk of falls: Patient Identification Verified: Yes Signs or symptoms of abuse/neglect since last visito No Secondary Verification Process Completed: Yes Hospitalized since last visit: No Patient Requires Transmission-Based No Implantable device outside of the clinic excluding No Precautions: cellular tissue based products placed in the center Patient Has Alerts: Yes since last visit: Has Dressing in Place as Prescribed: Yes Pain Present Now: No Electronic Signature(s) Signed: 10/12/2017 4:42:31 PM By: Secundino Ginger Entered By: Secundino Ginger on 10/12/2017 10:35:04 [name] ([id number]) -------------------------------------------------------------------------------- Clinic Level of Care Assessment Details Patient Name: [name] Date of Service: 10/12/2017 10:00 AM Medical Record Number: [id number] Patient Account Number: [id number] Date of Birth/Sex: [birth year] (72 y.o. M) Treating RN: Montey Hora Primary Care Rooney Gladwin: Thomes Lolling Other Clinician: Referring Doyce Stonehouse: Thomes Lolling Treating Kiondra Caicedo/Extender: Melburn Hake, HOYT Weeks in Treatment: 89 Clinic Level of Care Assessment Items TOOL 4 Quantity Score []  - Use when only an EandM is  performed on FOLLOW-UP visit 0 ASSESSMENTS - Nursing Assessment / Reassessment X - Reassessment of Co-morbidities (includes updates in patient status) 1 10 X- 1 5 Reassessment of Adherence to Treatment Plan ASSESSMENTS - Wound and Skin Assessment / Reassessment X - Simple Wound Assessment / Reassessment - one wound 1 5 []  - 0 Complex Wound Assessment / Reassessment - multiple wounds []  - 0 Dermatologic / Skin Assessment (not related to wound area) ASSESSMENTS - Focused Assessment []  - Circumferential Edema Measurements - multi extremities 0 []  - 0 Nutritional Assessment / Counseling / Intervention []  - 0 Lower Extremity Assessment (monofilament, tuning fork, pulses) []  - 0 Peripheral Arterial Disease Assessment (using hand held doppler) ASSESSMENTS - Ostomy and/or Continence Assessment and Care []  - Incontinence Assessment and Management 0 []  - 0 Ostomy Care Assessment and Management (repouching, etc.) PROCESS - Coordination of Care X - Simple Patient / Family Education for ongoing care 1 15 []  - 0 Complex (extensive) Patient / Family Education for ongoing care []  - 0 Staff obtains Programmer, systems, Records, Test Results / Process Orders []  - 0 Staff telephones HHA, Nursing Homes / Clarify orders / etc []  - 0 Routine Transfer to another Facility (non-emergent condition) []  - 0 Routine Hospital Admission (non-emergent condition) []  - 0 New Admissions / Biomedical engineer / Ordering NPWT, Apligraf, etc. []  - 0 Emergency Hospital Admission (emergent condition) X- 1 10 Simple Discharge Coordination [name], [name] ([id number]) []  - 0 Complex (extensive) Discharge Coordination PROCESS - Special Needs []  - Pediatric / Minor Patient Management 0 []  - 0 Isolation Patient Management []  - 0 Hearing / Language / Visual special needs []  - 0 Assessment of Community assistance (transportation, D/C planning, etc.) []  - 0 Additional assistance / Altered mentation []  - 0 Support  Surface(s) Assessment (bed, cushion, seat, etc.) INTERVENTIONS - Wound Cleansing / Measurement X - Simple Wound Cleansing - one wound 1 5 []  - 0 Complex Wound  Cleansing - multiple wounds X- 1 5 Wound Imaging (photographs - any number of wounds) []  - 0 Wound Tracing (instead of photographs) X- 1 5 Simple Wound Measurement - one wound []  - 0 Complex Wound Measurement - multiple wounds INTERVENTIONS - Wound Dressings []  - Small Wound Dressing one or multiple wounds 0 []  - 0 Medium Wound Dressing one or multiple wounds []  - 0 Large Wound Dressing one or multiple wounds []  - 0 Application of Medications - topical []  - 0 Application of Medications - injection INTERVENTIONS - Miscellaneous []  - External ear exam 0 []  - 0 Specimen Collection (cultures, biopsies, blood, body fluids, etc.) []  - 0 Specimen(s) / Culture(s) sent or taken to Lab for analysis []  - 0 Patient Transfer (multiple staff / Civil Service fast streamer / Similar devices) []  - 0 Simple Staple / Suture removal (25 or less) []  - 0 Complex Staple / Suture removal (26 or more) []  - 0 Hypo / Hyperglycemic Management (close monitor of Blood Glucose) []  - 0 Ankle / Brachial Index (ABI) - do not check if billed separately X- 1 5 Vital Signs [name], HYAMS. ([id number]) Has the patient been seen at the hospital within the last three years: Yes Total Score: 65 Level Of Care: New/Established - Level 2 Electronic Signature(s) Signed: 10/12/2017 5:18:28 PM By: Montey Hora Entered By: Montey Hora on 10/12/2017 11:37:42 [name] ([id number]) -------------------------------------------------------------------------------- Encounter Discharge Information Details Patient Name: [name] Date of Service: 10/12/2017 10:00 AM Medical Record Number: [id number] Patient Account Number: [id number] Date of Birth/Sex: June 14, 1945 (72 y.o. M) Treating RN: Montey Hora Primary Care Jamel Dunton: Thomes Lolling Other Clinician: Referring  Ian Cavey: Thomes Lolling Treating Srinidhi Landers/Extender: Sharalyn Ink in Treatment: 54 Encounter Discharge Information Items Discharge Condition: Stable Ambulatory Status: Ambulatory Discharge Destination: Home Transportation: Private Auto Accompanied By: self Schedule Follow-up Appointment: No Clinical Summary of Care: Electronic Signature(s) Signed: 10/12/2017 5:18:28 PM By: Montey Hora Entered By: Montey Hora on 10/12/2017 11:38:21 [name] ([id number]) -------------------------------------------------------------------------------- Lower Extremity Assessment Details Patient Name: [name] Date of Service: 10/12/2017 10:00 AM Medical Record Number: [id number] Patient Account Number: [id number] Date of Birth/Sex: 11-29-1945 (72 y.o. M) Treating RN: Secundino Ginger Primary Care Demita Tobia: Thomes Lolling Other Clinician: Referring Geneviene Tesch: Thomes Lolling Treating Shary Lamos/Extender: Melburn Hake, HOYT Weeks in Treatment: 01 Electronic Signature(s) Signed: 10/12/2017 4:42:31 PM By: Secundino Ginger Entered By: Secundino Ginger on 10/12/2017 10:37:48 [name] ([id number]) -------------------------------------------------------------------------------- Multi-Disciplinary Care Plan Details Patient Name: [name], [name] Date of Service: 10/12/2017 10:00 AM Medical Record Number: [id number] Patient Account Number: [id number] Date of Birth/Sex: 01/14/1946 (72 y.o. M) Treating RN: Montey Hora Primary Care Noel Henandez: Thomes Lolling Other Clinician: Referring Jennette Leask: Thomes Lolling Treating Darrel Baroni/Extender: Melburn Hake, HOYT Weeks in Treatment: 61 Active Inactive Electronic Signature(s) Signed: 10/12/2017 5:18:28 PM By: Montey Hora Entered By: Montey Hora on 10/12/2017 11:31:33 [name] ([id number]) -------------------------------------------------------------------------------- Pain Assessment Details Patient Name: [name] Date of Service: 10/12/2017 10:00  AM Medical Record Number: [id number] Patient Account Number: [id number] Date of Birth/Sex: 01/05/1946 (72 y.o. M) Treating RN: Secundino Ginger Primary Care Aly Hauser: Thomes Lolling Other Clinician: Referring Yeiren Whitecotton: Thomes Lolling Treating Amory Simonetti/Extender: Melburn Hake, HOYT Weeks in Treatment: 25 Active Problems Location of Pain Severity and Description of Pain Patient Has Paino No Site Locations Pain Management and Medication Current Pain Management: Goals for Pain Management pt denies any pain Electronic Signature(s) Signed: 10/12/2017 4:42:31 PM By: Secundino Ginger Entered By: Secundino Ginger on 10/12/2017 10:35:37 [name] ([id number]) --------------------------------------------------------------------------------  Patient/Caregiver Education Details Patient Name: [name], JERGER. Date of Service: 10/12/2017 10:00 AM Medical Record Number: [id number] Patient Account Number: [id number] Date of Birth/Gender: [birth year] (72 y.o. M) Treating RN: Montey Hora Primary Care Physician: Thomes Lolling Other Clinician: Referring Physician: Thomes Lolling Treating Physician/Extender: Sharalyn Ink in Treatment: 81 Education Assessment Education Provided To: Patient Education Topics Provided Basic Hygiene: Handouts: Other: care of newly healed ulcer site Methods: Explain/Verbal Responses: State content correctly Electronic Signature(s) Signed: 10/12/2017 5:18:28 PM By: Montey Hora Entered By: Montey Hora on 10/12/2017 11:38:49 [name] ([id number]) -------------------------------------------------------------------------------- Wound Assessment Details Patient Name: [name] Date of Service: 10/12/2017 10:00 AM Medical Record Number: [id number] Patient Account Number: [id number] Date of Birth/Sex: 16-Jul-1945 (72 y.o. M) Treating RN: Montey Hora Primary Care Newell Wafer: Thomes Lolling Other Clinician: Referring Duvan Mousel: Thomes Lolling Treating Hanzel Pizzo/Extender: Melburn Hake, HOYT Weeks in Treatment: 66 Wound Status Wound Number: 2 Primary Atypical Etiology: Wound Location: Left Abdomen - midline Wound Healed - Epithelialized Wounding Event: Gradually Appeared Status: Date Acquired: 05/17/2015 Comorbid Cataracts, Chronic Obstructive Pulmonary Weeks Of Treatment: 66 History: Disease (COPD), Hypertension, Type II Clustered Wound: Yes Diabetes, Osteoarthritis, Neuropathy Photos Photo Uploaded By: Secundino Ginger on 10/12/2017 10:42:29 Wound Measurements Length: (cm) 0 % Reduc Width: (cm) 0 % Reduc Depth: (cm) 0 Epithel Clustered Quantity: 1 Tunneli Area: (cm) 0 Underm Volume: (cm) 0 tion in Area: 100% tion in Volume: 100% ialization: None ng: No ining: No Wound Description Full Thickness Without Exposed Support Foul Od Classification: Structures Slough/ Wound Margin: Flat and Intact Exudate None Present Amount: or After Cleansing: No Fibrino No Wound Bed Granulation Amount: None Present (0%) Exposed Structure Necrotic Amount: Small (1-33%) Fascia Exposed: No Necrotic Quality: Adherent Slough Fat Layer (Subcutaneous Tissue) Exposed: No Tendon Exposed: No Muscle Exposed: No Joint Exposed: No Bone Exposed: No Limited to Skin Breakdown [name], [name] ([id number]) Periwound Skin Texture Texture Color No Abnormalities Noted: No No Abnormalities Noted: No Callus: No Atrophie Blanche: No Crepitus: No Cyanosis: No Excoriation: No Ecchymosis: No Induration: No Erythema: No Rash: No Hemosiderin Staining: No Scarring: Yes Mottled: No Pallor: No Moisture Rubor: No No Abnormalities Noted: No Dry / Scaly: No Temperature / Pain Maceration: No Temperature: No Abnormality Wound Preparation Ulcer Cleansing: Rinsed/Irrigated with Saline Topical Anesthetic Applied: None Electronic Signature(s) Signed: 10/12/2017 5:18:28 PM By: Montey Hora Entered By: Montey Hora on 10/12/2017 11:32:01 [name]  ([id number]) -------------------------------------------------------------------------------- Vitals Details Patient Name: [name] Date of Service: 10/12/2017 10:00 AM Medical Record Number: [id number] Patient Account Number: [id number] Date of Birth/Sex: [birth year] (72 y.o. M) Treating RN: Secundino Ginger Primary Care Deveion Denz: Thomes Lolling Other Clinician: Referring Cozette Braggs: Thomes Lolling Treating Lonell Stamos/Extender: Melburn Hake, HOYT Weeks in Treatment: 25 Vital Signs Time Taken: 10:30 Temperature (F): 98.2 Height (in): 65 Pulse (bpm): 70 Weight (lbs): 161 Respiratory Rate (breaths/min): 16 Body Mass Index (BMI): 26.8 Blood Pressure (mmHg): 145/69 Reference Range: 80 - 120 mg / dl Electronic Signature(s) Signed: 10/12/2017 4:42:31 PM By: Secundino Ginger Entered BySecundino Ginger on 10/12/2017 10:36:08

## 2017-10-25 MED ORDER — OXYCODONE-ACETAMINOPHEN 5 MG-325 MG TABLET
ORAL_TABLET | Freq: Four times a day (QID) | ORAL | 0 refills | 0 days | Status: CP | PRN
Start: 2017-10-25 — End: 2017-11-27

## 2017-11-20 MED ORDER — TAMSULOSIN 0.4 MG CAPSULE
ORAL_CAPSULE | 3 refills | 0 days | Status: CP
Start: 2017-11-20 — End: ?

## 2017-11-27 MED ORDER — OXYCODONE-ACETAMINOPHEN 5 MG-325 MG TABLET
ORAL_TABLET | Freq: Four times a day (QID) | ORAL | 0 refills | 0.00000 days | Status: CP | PRN
Start: 2017-11-27 — End: 2017-12-17

## 2017-12-17 ENCOUNTER — Encounter: Admit: 2017-12-17 | Discharge: 2017-12-17 | Payer: MEDICARE

## 2017-12-17 ENCOUNTER — Encounter: Admit: 2017-12-17 | Discharge: 2017-12-17 | Payer: MEDICARE | Attending: Internal Medicine | Primary: Internal Medicine

## 2017-12-17 DIAGNOSIS — I10 Essential (primary) hypertension: Secondary | ICD-10-CM

## 2017-12-17 DIAGNOSIS — E1142 Type 2 diabetes mellitus with diabetic polyneuropathy: Secondary | ICD-10-CM

## 2017-12-17 DIAGNOSIS — Z23 Encounter for immunization: Secondary | ICD-10-CM

## 2017-12-17 DIAGNOSIS — G8929 Other chronic pain: Secondary | ICD-10-CM

## 2017-12-17 DIAGNOSIS — M545 Low back pain: Secondary | ICD-10-CM

## 2017-12-17 DIAGNOSIS — M5090 Cervical disc disorder, unspecified, unspecified cervical region: Secondary | ICD-10-CM

## 2017-12-17 MED ORDER — MEDICAL SUPPLY ITEM
0 refills | 0 days | Status: CP
Start: 2017-12-17 — End: ?

## 2017-12-26 MED ORDER — OXYCODONE-ACETAMINOPHEN 5 MG-325 MG TABLET
ORAL_TABLET | ORAL | 0 refills | 0.00000 days | Status: CP | PRN
Start: 2017-12-26 — End: 2018-01-25

## 2018-01-23 MED ORDER — OXYCODONE-ACETAMINOPHEN 5 MG-325 MG TABLET
ORAL_TABLET | ORAL | 0 refills | 0.00000 days | Status: CP | PRN
Start: 2018-01-23 — End: 2018-03-19

## 2018-02-19 MED ORDER — GLIPIZIDE 5 MG TABLET
ORAL_TABLET | 1 refills | 0 days | Status: CP
Start: 2018-02-19 — End: 2018-08-21

## 2018-02-20 MED ORDER — OXYCODONE-ACETAMINOPHEN 5 MG-325 MG TABLET
ORAL_TABLET | ORAL | 0 refills | 0 days | Status: CP | PRN
Start: 2018-02-20 — End: 2018-03-19

## 2018-03-07 MED ORDER — METFORMIN 500 MG TABLET
ORAL_TABLET | 3 refills | 0 days | Status: CP
Start: 2018-03-07 — End: ?

## 2018-03-19 ENCOUNTER — Ambulatory Visit: Admit: 2018-03-19 | Discharge: 2018-03-20 | Payer: MEDICARE | Attending: Internal Medicine | Primary: Internal Medicine

## 2018-03-19 DIAGNOSIS — M5090 Cervical disc disorder, unspecified, unspecified cervical region: Secondary | ICD-10-CM

## 2018-03-19 DIAGNOSIS — J449 Chronic obstructive pulmonary disease, unspecified: Secondary | ICD-10-CM

## 2018-03-19 DIAGNOSIS — M545 Low back pain: Secondary | ICD-10-CM

## 2018-03-19 DIAGNOSIS — E1142 Type 2 diabetes mellitus with diabetic polyneuropathy: Secondary | ICD-10-CM

## 2018-03-19 DIAGNOSIS — E119 Type 2 diabetes mellitus without complications: Secondary | ICD-10-CM

## 2018-03-19 DIAGNOSIS — G8929 Other chronic pain: Secondary | ICD-10-CM

## 2018-03-19 DIAGNOSIS — I10 Essential (primary) hypertension: Principal | ICD-10-CM

## 2018-03-23 MED ORDER — OXYCODONE-ACETAMINOPHEN 5 MG-325 MG TABLET
ORAL_TABLET | ORAL | 0 refills | 0.00000 days | Status: CP | PRN
Start: 2018-03-23 — End: 2018-04-22

## 2018-04-23 MED ORDER — OXYCODONE-ACETAMINOPHEN 5 MG-325 MG TABLET
ORAL_TABLET | ORAL | 0 refills | 0.00000 days | Status: CP | PRN
Start: 2018-04-23 — End: 2018-06-17

## 2018-05-21 MED ORDER — OXYCODONE-ACETAMINOPHEN 5 MG-325 MG TABLET
ORAL_TABLET | ORAL | 0 refills | 0.00000 days | Status: CP | PRN
Start: 2018-05-21 — End: 2018-07-22

## 2018-06-17 ENCOUNTER — Encounter: Admit: 2018-06-17 | Discharge: 2018-06-18 | Payer: MEDICARE | Attending: Internal Medicine | Primary: Internal Medicine

## 2018-06-17 DIAGNOSIS — M5090 Cervical disc disorder, unspecified, unspecified cervical region: Principal | ICD-10-CM

## 2018-06-17 DIAGNOSIS — M545 Low back pain: Secondary | ICD-10-CM

## 2018-06-17 DIAGNOSIS — G8929 Other chronic pain: Secondary | ICD-10-CM

## 2018-06-17 MED ORDER — OXYCODONE-ACETAMINOPHEN 5 MG-325 MG TABLET
ORAL_TABLET | ORAL | 0 refills | 0 days | Status: CP | PRN
Start: 2018-06-17 — End: 2018-07-15

## 2018-06-17 MED ORDER — GABAPENTIN 300 MG CAPSULE
ORAL_CAPSULE | Freq: Two times a day (BID) | ORAL | 0 refills | 0 days
Start: 2018-06-17 — End: 2018-07-22

## 2018-06-17 MED ORDER — DULOXETINE 20 MG CAPSULE,DELAYED RELEASE
ORAL_CAPSULE | Freq: Every day | ORAL | 0 refills | 0 days | Status: CP
Start: 2018-06-17 — End: 2018-09-12

## 2018-07-15 MED ORDER — OXYCODONE-ACETAMINOPHEN 5 MG-325 MG TABLET
ORAL_TABLET | ORAL | 0 refills | 0 days | Status: CP | PRN
Start: 2018-07-15 — End: 2018-07-22

## 2018-07-22 ENCOUNTER — Encounter: Admit: 2018-07-22 | Discharge: 2018-07-23 | Payer: MEDICARE | Attending: Internal Medicine | Primary: Internal Medicine

## 2018-07-22 DIAGNOSIS — M545 Low back pain: Principal | ICD-10-CM

## 2018-07-22 DIAGNOSIS — G8929 Other chronic pain: Secondary | ICD-10-CM

## 2018-07-22 DIAGNOSIS — M5090 Cervical disc disorder, unspecified, unspecified cervical region: Secondary | ICD-10-CM

## 2018-07-22 MED ORDER — OXYCODONE-ACETAMINOPHEN 5 MG-325 MG TABLET
ORAL_TABLET | ORAL | 0 refills | 0.00000 days | Status: CP | PRN
Start: 2018-07-22 — End: 2018-08-21

## 2018-07-22 MED ORDER — GABAPENTIN 300 MG CAPSULE
ORAL_CAPSULE | Freq: Two times a day (BID) | ORAL | 0 refills | 0.00000 days
Start: 2018-07-22 — End: 2018-08-12

## 2018-08-12 MED ORDER — GABAPENTIN 300 MG CAPSULE
ORAL_CAPSULE | 3 refills | 0 days | Status: CP
Start: 2018-08-12 — End: ?

## 2018-08-15 MED ORDER — ATORVASTATIN 40 MG TABLET
ORAL_TABLET | 2 refills | 0 days | Status: CP
Start: 2018-08-15 — End: ?

## 2018-08-21 MED ORDER — GLIPIZIDE 5 MG TABLET
ORAL_TABLET | 1 refills | 0 days | Status: CP
Start: 2018-08-21 — End: ?

## 2018-08-21 MED ORDER — OXYCODONE-ACETAMINOPHEN 5 MG-325 MG TABLET
ORAL_TABLET | ORAL | 0 refills | 0 days | Status: CP | PRN
Start: 2018-08-21 — End: 2018-09-18

## 2018-09-03 MED ORDER — OMEPRAZOLE 20 MG CAPSULE,DELAYED RELEASE
ORAL_CAPSULE | Freq: Every day | ORAL | 3 refills | 0.00000 days | Status: CP
Start: 2018-09-03 — End: 2018-10-15

## 2018-09-04 MED ORDER — ALBUTEROL SULFATE HFA 90 MCG/ACTUATION AEROSOL INHALER
Freq: Four times a day (QID) | RESPIRATORY_TRACT | 3 refills | 0 days | Status: CP | PRN
Start: 2018-09-04 — End: ?

## 2018-09-12 MED ORDER — LISINOPRIL 10 MG TABLET
ORAL_TABLET | Freq: Every day | ORAL | 0 refills | 0 days | Status: CP
Start: 2018-09-12 — End: ?

## 2018-09-12 MED ORDER — DULOXETINE 20 MG CAPSULE,DELAYED RELEASE
ORAL_CAPSULE | 0 refills | 0 days | Status: CP
Start: 2018-09-12 — End: ?

## 2018-09-18 MED ORDER — OXYCODONE-ACETAMINOPHEN 5 MG-325 MG TABLET
ORAL_TABLET | ORAL | 0 refills | 0 days | Status: CP | PRN
Start: 2018-09-18 — End: 2018-10-17

## 2018-10-10 MED ORDER — SPIRIVA WITH HANDIHALER 18 MCG AND INHALATION CAPSULES
Freq: Every day | RESPIRATORY_TRACT | 3 refills | 90.00000 days | Status: CP
Start: 2018-10-10 — End: ?

## 2018-10-10 MED ORDER — FLUTICASONE PROPIONATE 50 MCG/ACTUATION NASAL SPRAY,SUSPENSION
Freq: Every day | NASAL | 4 refills | 123 days | Status: CP
Start: 2018-10-10 — End: ?

## 2018-10-15 MED ORDER — OMEPRAZOLE 20 MG CAPSULE,DELAYED RELEASE
ORAL_CAPSULE | Freq: Every day | ORAL | 3 refills | 90 days | Status: CP
Start: 2018-10-15 — End: ?

## 2018-10-18 MED ORDER — OXYCODONE-ACETAMINOPHEN 5 MG-325 MG TABLET
ORAL_TABLET | ORAL | 0 refills | 25 days | Status: CP | PRN
Start: 2018-10-18 — End: 2018-11-08

## 2018-11-08 MED ORDER — OXYCODONE-ACETAMINOPHEN 5 MG-325 MG TABLET
ORAL_TABLET | ORAL | 0 refills | 25.00000 days | Status: CP | PRN
Start: 2018-11-08 — End: 2018-12-08

## 2018-11-19 ENCOUNTER — Encounter: Admit: 2018-11-19 | Discharge: 2018-11-20 | Payer: MEDICAID | Attending: Internal Medicine | Primary: Internal Medicine

## 2018-11-19 DIAGNOSIS — M545 Low back pain: Secondary | ICD-10-CM

## 2018-11-19 DIAGNOSIS — I10 Essential (primary) hypertension: Secondary | ICD-10-CM

## 2018-11-19 DIAGNOSIS — M5090 Cervical disc disorder, unspecified, unspecified cervical region: Secondary | ICD-10-CM

## 2018-11-19 DIAGNOSIS — E1142 Type 2 diabetes mellitus with diabetic polyneuropathy: Secondary | ICD-10-CM

## 2018-11-19 DIAGNOSIS — G8929 Other chronic pain: Secondary | ICD-10-CM

## 2018-11-19 MED ORDER — OXYCODONE-ACETAMINOPHEN 5 MG-325 MG TABLET
ORAL_TABLET | ORAL | 0 refills | 25 days | Status: CP | PRN
Start: 2018-11-19 — End: 2018-12-19

## 2018-11-27 MED ORDER — LISINOPRIL 10 MG TABLET
ORAL_TABLET | 0 refills | 0 days | Status: CP
Start: 2018-11-27 — End: ?

## 2018-12-03 MED ORDER — TAMSULOSIN 0.4 MG CAPSULE
ORAL_CAPSULE | Freq: Every day | ORAL | 3 refills | 90.00000 days | Status: CP
Start: 2018-12-03 — End: ?

## 2018-12-05 DIAGNOSIS — N400 Enlarged prostate without lower urinary tract symptoms: Secondary | ICD-10-CM

## 2018-12-06 MED ORDER — FINASTERIDE 5 MG TABLET
ORAL_TABLET | Freq: Every day | ORAL | 3 refills | 90 days | Status: CP
Start: 2018-12-06 — End: 2019-12-06

## 2018-12-19 MED ORDER — OXYCODONE-ACETAMINOPHEN 5 MG-325 MG TABLET
ORAL_TABLET | ORAL | 0 refills | 25.00000 days | Status: CP | PRN
Start: 2018-12-19 — End: 2019-01-18

## 2019-01-18 MED ORDER — OXYCODONE-ACETAMINOPHEN 10 MG-325 MG TABLET
ORAL_TABLET | ORAL | 0 refills | 25 days | Status: CP | PRN
Start: 2019-01-18 — End: 2019-02-17

## 2019-02-17 DIAGNOSIS — G8929 Other chronic pain: Principal | ICD-10-CM

## 2019-02-17 DIAGNOSIS — M5090 Cervical disc disorder, unspecified, unspecified cervical region: Principal | ICD-10-CM

## 2019-02-17 DIAGNOSIS — M545 Low back pain: Principal | ICD-10-CM

## 2019-02-17 MED ORDER — OXYCODONE-ACETAMINOPHEN 5 MG-325 MG TABLET
ORAL_TABLET | ORAL | 0 refills | 25 days | Status: CP | PRN
Start: 2019-02-17 — End: 2019-03-19

## 2019-02-18 DIAGNOSIS — M545 Low back pain: Principal | ICD-10-CM

## 2019-02-18 DIAGNOSIS — M5090 Cervical disc disorder, unspecified, unspecified cervical region: Principal | ICD-10-CM

## 2019-02-18 DIAGNOSIS — G8929 Other chronic pain: Principal | ICD-10-CM

## 2019-02-20 MED ORDER — GLIPIZIDE 5 MG TABLET
ORAL_TABLET | 1 refills | 0 days | Status: CP
Start: 2019-02-20 — End: ?

## 2019-02-24 ENCOUNTER — Encounter
Admit: 2019-02-24 | Discharge: 2019-02-25 | Disposition: A | Discharge status: Home / Self Care | Payer: MEDICARE | Attending: Student in an Organized Health Care Education/Training Program

## 2019-02-24 ENCOUNTER — Encounter: Admit: 2019-02-24 | Discharge: 2019-02-24 | Payer: MEDICARE | Attending: Internal Medicine | Primary: Internal Medicine

## 2019-02-24 ENCOUNTER — Ambulatory Visit
Admit: 2019-02-24 | Discharge: 2019-02-24 | Payer: MEDICARE | Attending: Nurse Practitioner | Primary: Nurse Practitioner

## 2019-02-24 DIAGNOSIS — U071 COVID-19: Principal | ICD-10-CM

## 2019-02-24 DIAGNOSIS — J988 Other specified respiratory disorders: Principal | ICD-10-CM

## 2019-02-24 DIAGNOSIS — I10 Essential (primary) hypertension: Principal | ICD-10-CM

## 2019-03-04 MED ORDER — LISINOPRIL 10 MG TABLET
ORAL_TABLET | 0 refills | 0 days | Status: CP
Start: 2019-03-04 — End: ?

## 2019-03-08 ENCOUNTER — Encounter: Payer: Self-pay | Admitting: Intensive Care

## 2019-03-08 ENCOUNTER — Emergency Department
Admission: EM | Admit: 2019-03-08 | Discharge: 2019-03-08 | Disposition: A | Discharge status: Home / Self Care | Payer: Medicare Other | Attending: Emergency Medicine | Admitting: Emergency Medicine

## 2019-03-08 ENCOUNTER — Other Ambulatory Visit: Payer: Self-pay

## 2019-03-08 ENCOUNTER — Emergency Department: Payer: Medicare Other

## 2019-03-08 ENCOUNTER — Encounter
Admit: 2019-03-08 | Discharge: 2019-03-09 | Payer: MEDICARE | Attending: Physician Assistant | Primary: Physician Assistant

## 2019-03-08 DIAGNOSIS — J988 Other specified respiratory disorders: Principal | ICD-10-CM

## 2019-03-08 DIAGNOSIS — U071 COVID-19: Principal | ICD-10-CM

## 2019-03-08 DIAGNOSIS — R630 Anorexia: Principal | ICD-10-CM

## 2019-03-08 DIAGNOSIS — R197 Diarrhea, unspecified: Principal | ICD-10-CM

## 2019-03-08 DIAGNOSIS — R0609 Other forms of dyspnea: Principal | ICD-10-CM

## 2019-03-08 DIAGNOSIS — R531 Weakness: Secondary | ICD-10-CM | POA: Diagnosis present

## 2019-03-08 DIAGNOSIS — Z7984 Long term (current) use of oral hypoglycemic drugs: Secondary | ICD-10-CM | POA: Insufficient documentation

## 2019-03-08 DIAGNOSIS — N189 Chronic kidney disease, unspecified: Secondary | ICD-10-CM | POA: Insufficient documentation

## 2019-03-08 DIAGNOSIS — F17220 Nicotine dependence, chewing tobacco, uncomplicated: Secondary | ICD-10-CM | POA: Diagnosis not present

## 2019-03-08 DIAGNOSIS — R110 Nausea: Secondary | ICD-10-CM | POA: Insufficient documentation

## 2019-03-08 DIAGNOSIS — I129 Hypertensive chronic kidney disease with stage 1 through stage 4 chronic kidney disease, or unspecified chronic kidney disease: Secondary | ICD-10-CM | POA: Diagnosis not present

## 2019-03-08 DIAGNOSIS — E1122 Type 2 diabetes mellitus with diabetic chronic kidney disease: Secondary | ICD-10-CM | POA: Diagnosis not present

## 2019-03-08 DIAGNOSIS — J449 Chronic obstructive pulmonary disease, unspecified: Secondary | ICD-10-CM | POA: Diagnosis not present

## 2019-03-08 DIAGNOSIS — Z79899 Other long term (current) drug therapy: Secondary | ICD-10-CM | POA: Insufficient documentation

## 2019-03-08 HISTORY — DX: Chronic obstructive pulmonary disease, unspecified: J44.9

## 2019-03-08 LAB — CBC
HCT: 39.2 % (ref 39.0–52.0)
Hemoglobin: 13.7 g/dL (ref 13.0–17.0)
MCH: 30.1 pg (ref 26.0–34.0)
MCHC: 34.9 g/dL (ref 30.0–36.0)
MCV: 86.2 fL (ref 80.0–100.0)
Platelets: 281 10*3/uL (ref 150–400)
RBC: 4.55 MIL/uL (ref 4.22–5.81)
RDW: 13.1 % (ref 11.5–15.5)
WBC: 5.6 10*3/uL (ref 4.0–10.5)
nRBC: 0 % (ref 0.0–0.2)

## 2019-03-08 LAB — URINALYSIS, COMPLETE (UACMP) WITH MICROSCOPIC
Bilirubin Urine: NEGATIVE
Glucose, UA: NEGATIVE mg/dL
Hgb urine dipstick: NEGATIVE
Ketones, ur: NEGATIVE mg/dL
Leukocytes,Ua: NEGATIVE
Nitrite: NEGATIVE
Protein, ur: 100 mg/dL — AB
Specific Gravity, Urine: 1.021 (ref 1.005–1.030)
pH: 5 (ref 5.0–8.0)

## 2019-03-08 LAB — COMPREHENSIVE METABOLIC PANEL
ALT: 15 U/L (ref 0–44)
AST: 26 U/L (ref 15–41)
Albumin: 3.4 g/dL — ABNORMAL LOW (ref 3.5–5.0)
Alkaline Phosphatase: 54 U/L (ref 38–126)
Anion gap: 11 (ref 5–15)
BUN: 25 mg/dL — ABNORMAL HIGH (ref 8–23)
CO2: 25 mmol/L (ref 22–32)
Calcium: 8.8 mg/dL — ABNORMAL LOW (ref 8.9–10.3)
Chloride: 106 mmol/L (ref 98–111)
Creatinine, Ser: 1.46 mg/dL — ABNORMAL HIGH (ref 0.61–1.24)
GFR calc Af Amer: 54 mL/min — ABNORMAL LOW (ref >60)
GFR calc non Af Amer: 46 mL/min — ABNORMAL LOW (ref >60)
Glucose, Bld: 85 mg/dL (ref 70–99)
Potassium: 3.2 mmol/L — ABNORMAL LOW (ref 3.5–5.1)
Sodium: 142 mmol/L (ref 135–145)
Total Bilirubin: 1.1 mg/dL (ref 0.3–1.2)
Total Protein: 7.3 g/dL (ref 6.5–8.1)

## 2019-03-08 LAB — LIPASE, BLOOD: Lipase: 73 U/L — ABNORMAL HIGH (ref 11–51)

## 2019-03-08 MED ORDER — AZITHROMYCIN 250 MG PO TABS: ORAL_TABLET | ORAL | 0 refills | Status: AC

## 2019-03-08 MED ORDER — ONDANSETRON 4 MG PO TBDP: 4.0000 mg | ORAL_TABLET | Freq: Three times a day (TID) | ORAL | 0 refills | Status: AC | PRN

## 2019-03-08 MED ORDER — SODIUM CHLORIDE 0.9 % IV BOLUS
1000.0000 mL | Freq: Once | INTRAVENOUS | Status: AC
  Administered 2019-03-08: 1000 mL via INTRAVENOUS

## 2019-03-08 MED ORDER — ONDANSETRON HCL 4 MG/2ML IJ SOLN
4.0000 mg | Freq: Once | INTRAMUSCULAR | Status: AC
  Administered 2019-03-08: 14:00:00 4 mg via INTRAVENOUS
  Filled 2019-03-08: qty 2

## 2019-03-08 NOTE — ED Triage Notes (Signed)
Arrived by EMS from home for weakness and emesis. Diagnosed COVID + 02/24/19. Denies pain. Denies SOB

## 2019-03-08 NOTE — ED Provider Notes (Signed)
UA negative for UTI. Will d/c home per initial doctors plan.      Vanessa [place], MD 03/08/19 1620

## 2019-03-08 NOTE — ED Notes (Signed)
Pt had a bowel movement and produced approx 22ml of urine.

## 2019-03-08 NOTE — ED Notes (Signed)
Pt called out to be unhooked for the bathroom. Gave pt a urinal and told pt we needed him to urinate into the urinal. Pt yelling at this RN, "I told you I need to go over there." (to the toilet) I advised pt it was fine for him to go to the toilet but we needed a urine specimen. Pt states he is ready to go because he is freezing and no one has given him a blanket. Pt has a blanket on the bed. Offered to get pt a new blanket.

## 2019-03-08 NOTE — Discharge Instructions (Signed)
Person Under Monitoring Name: [name]  Location: [place] [address]   Infection Prevention Recommendations for Individuals Confirmed to have, or Being Evaluated for, 2019 Novel Coronavirus (COVID-19) Infection Who Receive Care at Home  Individuals who are confirmed to have, or are being evaluated for, COVID-19 should follow the prevention steps below until a healthcare provider or local or state health department says they can return to normal activities.  Stay home except to get medical care You should restrict activities outside your home, except for getting medical care. Do not go to work, school, or public areas, and do not use public transportation or taxis.  Call ahead before visiting your doctor Before your medical appointment, call the healthcare provider and tell them that you have, or are being evaluated for, COVID-19 infection. This will help the healthcare provider's office take steps to keep other people from getting infected. Ask your healthcare provider to call the local or state health department.  Monitor your symptoms Seek prompt medical attention if your illness is worsening (e.g., difficulty breathing). Before going to your medical appointment, call the healthcare provider and tell them that you have, or are being evaluated for, COVID-19 infection. Ask your healthcare provider to call the local or state health department.  Wear a facemask You should wear a facemask that covers your nose and mouth when you are in the same room with other people and when you visit a healthcare provider. People who live with or visit you should also wear a facemask while they are in the same room with you.  Separate yourself from other people in your home As much as possible, you should stay in a different room from other people in your home. Also, you should use a separate bathroom, if available.  Avoid sharing household items You should  not share dishes, drinking glasses, cups, eating utensils, towels, bedding, or other items with other people in your home. After using these items, you should wash them thoroughly with soap and water.  Cover your coughs and sneezes Cover your mouth and nose with a tissue when you cough or sneeze, or you can cough or sneeze into your sleeve. Throw used tissues in a lined trash can, and immediately wash your hands with soap and water for at least 20 seconds or use an alcohol-based hand rub.  Wash your [facility] your hands often and thoroughly with soap and water for at least 20 seconds. You can use an alcohol-based hand sanitizer if soap and water are not available and if your hands are not visibly dirty. Avoid touching your eyes, nose, and mouth with unwashed hands.   Prevention Steps for Caregivers and Household Members of Individuals Confirmed to have, or Being Evaluated for, COVID-19 Infection Being Cared for in the Home  If you live with, or provide care at home for, a person confirmed to have, or being evaluated for, COVID-19 infection please follow these guidelines to prevent infection:  Follow healthcare provider's instructions Make sure that you understand and can help the patient follow any healthcare provider instructions for all care.  Provide for the patient's basic needs You should help the patient with basic needs in the home and provide support for getting groceries, prescriptions, and other personal needs.  Monitor the patient's symptoms If they are getting sicker, call his or her medical provider and tell them that the patient has, or is being evaluated for, COVID-19 infection. This will help the healthcare provider's  office take steps to keep other people from getting infected. Ask the healthcare provider to call the local or state health department.  Limit the number of people who have contact with the patient If possible, have only one caregiver for the  patient. Other household members should stay in another home or place of residence. If this is not possible, they should stay in another room, or be separated from the patient as much as possible. Use a separate bathroom, if available. Restrict visitors who do not have an essential need to be in the home.  Keep older adults, very young children, and other sick people away from the patient Keep older adults, very young children, and those who have compromised immune systems or chronic health conditions away from the patient. This includes people with chronic heart, lung, or kidney conditions, diabetes, and cancer.  Ensure good ventilation Make sure that shared spaces in the home have good air flow, such as from an air conditioner or an opened window, weather permitting.  Wash your hands often Wash your hands often and thoroughly with soap and water for at least 20 seconds. You can use an alcohol based hand sanitizer if soap and water are not available and if your hands are not visibly dirty. Avoid touching your eyes, nose, and mouth with unwashed hands. Use disposable paper towels to dry your hands. If not available, use dedicated cloth towels and replace them when they become wet.  Wear a facemask and gloves Wear a disposable facemask at all times in the room and gloves when you touch or have contact with the patient's blood, body fluids, and/or secretions or excretions, such as sweat, saliva, sputum, nasal mucus, vomit, urine, or feces.  Ensure the mask fits over your nose and mouth tightly, and do not touch it during use. Throw out disposable facemasks and gloves after using them. Do not reuse. Wash your hands immediately after removing your facemask and gloves. If your personal clothing becomes contaminated, carefully remove clothing and launder. Wash your hands after handling contaminated clothing. Place all used disposable facemasks, gloves, and other waste in a lined container before  disposing them with other household waste. Remove gloves and wash your hands immediately after handling these items.  Do not share dishes, glasses, or other household items with the patient Avoid sharing household items. You should not share dishes, drinking glasses, cups, eating utensils, towels, bedding, or other items with a patient who is confirmed to have, or being evaluated for, COVID-19 infection. After the person uses these items, you should wash them thoroughly with soap and water.  Wash laundry thoroughly Immediately remove and wash clothes or bedding that have blood, body fluids, and/or secretions or excretions, such as sweat, saliva, sputum, nasal mucus, vomit, urine, or feces, on them. Wear gloves when handling laundry from the patient. Read and follow directions on labels of laundry or clothing items and detergent. In general, wash and dry with the warmest temperatures recommended on the label.  Clean all areas the individual has used often Clean all touchable surfaces, such as counters, tabletops, doorknobs, bathroom fixtures, toilets, phones, keyboards, tablets, and bedside tables, every day. Also, clean any surfaces that may have blood, body fluids, and/or secretions or excretions on them. Wear gloves when cleaning surfaces the patient has come in contact with. Use a diluted bleach solution (e.g., dilute bleach with 1 part bleach and 10 parts water) or a household disinfectant with a label that says EPA-registered for coronaviruses. To make a  bleach solution at home, add 1 tablespoon of bleach to 1 quart (4 cups) of water. For a larger supply, add  cup of bleach to 1 gallon (16 cups) of water. Read labels of cleaning products and follow recommendations provided on product labels. Labels contain instructions for safe and effective use of the cleaning product including precautions you should take when applying the product, such as wearing gloves or eye protection and making sure you  have good ventilation during use of the product. Remove gloves and wash hands immediately after cleaning.  Monitor yourself for signs and symptoms of illness Caregivers and household members are considered close contacts, should monitor their health, and will be asked to limit movement outside of the home to the extent possible. Follow the monitoring steps for close contacts listed on the symptom monitoring form.   ? If you have additional questions, contact your local health department or call the epidemiologist on call at [phone] (available 24/7). ? This guidance is subject to change. For the most up-to-date guidance from [facility], please refer to their website: YouBlogs.pl

## 2019-03-08 NOTE — ED Notes (Signed)
EKG from 03/08/2019 19:54 entered in this pt's chart in error. This EKG is not from this pt.

## 2019-03-08 NOTE — ED Provider Notes (Signed)
[facility] Emergency Department Provider Note  Time seen: 2:11 PM  I have reviewed the triage vital signs and the nursing notes.   HISTORY  Chief Complaint Weakness and Emesis   HPI [name] is a 73 y.o. male with a past medical history of COPD, diabetes, hypertension, recently diagnosed Covid approximately 10 days ago presents to the emergency department for generalized weakness and nausea.  According to the patient for the past week or so he has been feeling fatigued and weak with nausea being unable to eat due to the nausea but denies any vomiting.  Does state frequent loose stool.  Denies any recent fever, states minimal cough.   Past Medical History:  Diagnosis Date  . Arm pain    r/t neck issues-bilateral  . Arthritis   . Chronic kidney disease    slow to start when urinating  . COPD (chronic obstructive pulmonary disease) ([place])   . Diabetes mellitus    Metformin and Glipizide  . GERD (gastroesophageal reflux disease)    takes Omeprazole daily  . H/O: lung cancer   . Headache(784.0)   . History of colon polyps   . Hypertension   . Kidney stones    hx of    Patient Active Problem List   Diagnosis Date Noted  . Herniated cervical disc without myelopathy 02/17/2011    Past Surgical History:  Procedure Laterality Date  . ANTERIOR CERVICAL DECOMP/DISCECTOMY FUSION  02/17/2011   Procedure: ANTERIOR CERVICAL DECOMPRESSION/DISCECTOMY FUSION 2 LEVELS;  Surgeon: Olga Coaster Kritzer;  Location: [place] [facility];  Service: Neurosurgery;  Laterality: N/A;  Anterior Cervical Five-Six, Six-Seven Decompression and Fusion  . CATARACT EXTRACTION  4-21yrs ago   bilateral  . CHOLECYSTECTOMY  90's  . COLONOSCOPY    . CYSTOSCOPY W/ URETERAL STENT PLACEMENT  2011  . HERNIA REPAIR     as a child  . LOBECTOMY  90's   left lower lobe d/t lung cancer    Prior to Admission medications   Medication Sig Start Date End Date Taking? Authorizing Provider   amLODipine (NORVASC) 2.5 MG tablet Take 2.5 mg by mouth at bedtime.      [provider]  atenolol (TENORMIN) 50 MG tablet Take 25-50 mg by mouth daily. Takes 1/2 tab (25 mg) in the morning, and 1 tablet (50 mg) at bedtime    [provider]  enalapril-hydrochlorothiazide (VASERETIC) 10-25 MG per tablet Take 1 tablet by mouth daily.      [provider]  gabapentin (NEURONTIN) 100 MG capsule Take 100 mg by mouth 3 (three) times daily.      [provider]  glipiZIDE (GLUCOTROL) 10 MG tablet Take 20 mg by mouth 2 (two) times daily before a meal.      [provider]  metFORMIN (GLUCOPHAGE) 500 MG tablet Take 500 mg by mouth 2 (two) times daily with a meal.      [provider]  omeprazole (PRILOSEC) 20 MG capsule Take 20 mg by mouth daily.      [provider]  oxyCODONE-acetaminophen (PERCOCET) 5-325 MG per tablet Take 1 tablet by mouth daily as needed. For pain    [provider]    No Known Allergies  Family History  Problem Relation Age of Onset  . Anesthesia problems Neg Hx   . Hypotension Neg Hx   . Malignant hyperthermia Neg Hx   . Pseudochol deficiency Neg Hx     Social History Social History  Tobacco Use  . Smoking status: Former Smoker    Years: 40.00  . Smokeless tobacco: Current User    Types: Chew  Substance Use Topics  . Alcohol use: No  . Drug use: No    Review of Systems Constitutional: Positive for fever but not over the last few days per patient.  Positive for generalized fatigue. Cardiovascular: Negative for chest pain. Respiratory: Negative for shortness of breath.  Minimal cough. Gastrointestinal: Negative for abdominal pain.  Positive for nausea and diarrhea but negative for vomiting. Genitourinary: Negative for urinary compaints Musculoskeletal: Negative for musculoskeletal complaints Neurological: Negative for headache All other ROS  negative  ____________________________________________   PHYSICAL EXAM:  VITAL SIGNS: ED Triage Vitals [03/08/19 1237]  Enc Vitals Group     BP      Pulse Rate 85     Resp      Temp 98.7 F (37.1 C)     Temp Source Oral     SpO2 97 %     Weight 140 lb (63.5 kg)     Height 5\' 5"  (1.651 m)     Head Circumference      Peak Flow      Pain Score 0     Pain Loc      Pain Edu?      Excl. in [place]?    Constitutional: Alert and oriented. Well appearing and in no distress. Eyes: Normal exam ENT      Head: Normocephalic and atraumatic.      Mouth/Throat: Mucous membranes are moist. Cardiovascular: Normal rate, regular rhythm.  Respiratory: Normal respiratory effort without tachypnea nor retractions. Breath sounds are clear Gastrointestinal: Soft and nontender. No distention.  Musculoskeletal: Nontender with normal range of motion in all extremities.  Neurologic:  Normal speech and language. No gross focal neurologic deficits  Skin:  Skin is warm, dry and intact.  Psychiatric: Mood and affect are normal.   ____________________________________________    EKG  EKG viewed and interpreted by myself shows a normal sinus rhythm 87 bpm with a narrow QRS, normal axis, normal intervals, no concerning ST changes.  ____________________________________________    RADIOLOGY  Chest x-ray shows COPD questionable right lower lobe infiltrate.  ____________________________________________   INITIAL IMPRESSION / ASSESSMENT AND PLAN / ED COURSE  Pertinent labs & imaging results that were available during my care of the patient were reviewed by me and considered in my medical decision making (see chart for details).   Patient with recently diagnosed Covid presents to the emergency department for generalized fatigue and nausea.  Overall the patient appears well, reassuring physical exam reassuring vitals including 97% room air saturation with no respiratory distress or difficulty breathing.   We will check labs, IV hydrate and obtain a chest x-ray to further evaluate.  Patient agreeable to plan of care and asking how long it will be before he is able to go home.  [name] was evaluated in Emergency Department on 03/08/2019 for the symptoms described in the history of present illness. He was evaluated in the context of the global COVID-19 pandemic, which necessitated consideration that the patient might be at risk for infection with the SARS-CoV-2 virus that causes COVID-19. Institutional protocols and algorithms that pertain to the evaluation of patients at risk for COVID-19 are in a state of rapid change based on information released by regulatory bodies including the CDC and federal and state organizations. These policies and algorithms were followed during the patient's care in the ED.  ____________________________________________   FINAL CLINICAL IMPRESSION(S) / ED DIAGNOSES  Weakness Nausea   Harvest Dark, MD 03/11/19 [phone]

## 2019-03-17 MED ORDER — OXYCODONE-ACETAMINOPHEN 5 MG-325 MG TABLET
ORAL_TABLET | ORAL | 0 refills | 25 days | Status: CP | PRN
Start: 2019-03-17 — End: 2019-04-16

## 2019-03-18 ENCOUNTER — Encounter: Admit: 2019-03-18 | Discharge: 2019-03-19 | Payer: MEDICARE | Attending: Internal Medicine | Primary: Internal Medicine

## 2019-03-18 DIAGNOSIS — U071 COVID-19 virus infection: Principal | ICD-10-CM

## 2019-03-18 DIAGNOSIS — I10 Essential (primary) hypertension: Principal | ICD-10-CM

## 2019-03-18 DIAGNOSIS — M5090 Cervical disc disorder, unspecified, unspecified cervical region: Principal | ICD-10-CM

## 2019-03-18 DIAGNOSIS — M545 Low back pain: Secondary | ICD-10-CM

## 2019-03-18 DIAGNOSIS — G8929 Other chronic pain: Principal | ICD-10-CM

## 2019-03-18 DIAGNOSIS — E1142 Type 2 diabetes mellitus with diabetic polyneuropathy: Principal | ICD-10-CM

## 2019-03-18 MED ORDER — METFORMIN 500 MG TABLET
ORAL_TABLET | Freq: Two times a day (BID) | ORAL | 3 refills | 90.00000 days | Status: CP
Start: 2019-03-18 — End: ?

## 2019-04-17 MED ORDER — OXYCODONE-ACETAMINOPHEN 5 MG-325 MG TABLET
ORAL_TABLET | ORAL | 0 refills | 25 days | Status: CP | PRN
Start: 2019-04-17 — End: 2019-05-17

## 2019-05-14 MED ORDER — ATORVASTATIN 40 MG TABLET
ORAL_TABLET | 3 refills | 0 days | Status: CP
Start: 2019-05-14 — End: ?

## 2019-05-17 MED ORDER — OXYCODONE-ACETAMINOPHEN 5 MG-325 MG TABLET
ORAL_TABLET | ORAL | 0 refills | 25 days | Status: CP | PRN
Start: 2019-05-17 — End: 2019-06-16

## 2019-05-28 MED ORDER — LISINOPRIL 10 MG TABLET
ORAL_TABLET | 0 refills | 0 days | Status: CP
Start: 2019-05-28 — End: ?

## 2019-06-16 ENCOUNTER — Encounter: Admit: 2019-06-16 | Discharge: 2019-06-16 | Payer: MEDICARE

## 2019-06-16 ENCOUNTER — Encounter: Admit: 2019-06-16 | Discharge: 2019-06-16 | Payer: MEDICARE | Attending: Internal Medicine | Primary: Internal Medicine

## 2019-06-16 DIAGNOSIS — N183 Chronic kidney disease, stage 3 (moderate): Principal | ICD-10-CM

## 2019-06-16 DIAGNOSIS — M545 Low back pain: Principal | ICD-10-CM

## 2019-06-16 DIAGNOSIS — M5090 Cervical disc disorder, unspecified, unspecified cervical region: Principal | ICD-10-CM

## 2019-06-16 DIAGNOSIS — G8929 Other chronic pain: Secondary | ICD-10-CM

## 2019-06-16 DIAGNOSIS — R600 Localized edema: Principal | ICD-10-CM

## 2019-06-16 DIAGNOSIS — E1142 Type 2 diabetes mellitus with diabetic polyneuropathy: Principal | ICD-10-CM

## 2019-06-16 DIAGNOSIS — R634 Abnormal weight loss: Principal | ICD-10-CM

## 2019-06-16 DIAGNOSIS — I10 Essential (primary) hypertension: Principal | ICD-10-CM

## 2019-06-16 DIAGNOSIS — N1831 Chronic kidney disease, stage 3a: Secondary | ICD-10-CM | POA: Diagnosis not present

## 2019-06-16 MED ORDER — OXYCODONE-ACETAMINOPHEN 5 MG-325 MG TABLET
ORAL_TABLET | ORAL | 0 refills | 25.00000 days | Status: CP | PRN
Start: 2019-06-16 — End: 2019-07-16

## 2019-06-16 MED ORDER — FLUTICASONE PROPIONATE 50 MCG/ACTUATION NASAL SPRAY,SUSPENSION
4 refills | 0 days | Status: CP
Start: 2019-06-16 — End: ?

## 2019-06-16 MED ORDER — LISINOPRIL 20 MG TABLET
ORAL_TABLET | Freq: Every day | ORAL | 0 refills | 90.00000 days | Status: CP
Start: 2019-06-16 — End: 2019-09-14

## 2019-06-23 ENCOUNTER — Ambulatory Visit: Admit: 2019-06-23 | Discharge: 2019-06-24 | Payer: MEDICARE

## 2019-06-23 DIAGNOSIS — I10 Essential (primary) hypertension: Principal | ICD-10-CM

## 2019-06-23 MED ORDER — HYDROCHLOROTHIAZIDE 12.5 MG TABLET
ORAL_TABLET | Freq: Every day | ORAL | 0 refills | 30.00000 days | Status: CP
Start: 2019-06-23 — End: 2019-07-23

## 2019-06-23 MED ORDER — AMLODIPINE 5 MG TABLET
ORAL_TABLET | Freq: Every day | ORAL | 0 refills | 30 days | Status: CP
Start: 2019-06-23 — End: 2019-07-23

## 2019-06-25 ENCOUNTER — Encounter
Admit: 2019-06-25 | Discharge: 2019-06-26 | Payer: MEDICARE | Attending: Student in an Organized Health Care Education/Training Program | Primary: Student in an Organized Health Care Education/Training Program

## 2019-06-25 DIAGNOSIS — I10 Essential (primary) hypertension: Principal | ICD-10-CM

## 2019-06-27 ENCOUNTER — Encounter: Admit: 2019-06-27 | Discharge: 2019-06-28 | Payer: MEDICARE | Attending: Internal Medicine | Primary: Internal Medicine

## 2019-06-27 DIAGNOSIS — M545 Low back pain: Principal | ICD-10-CM

## 2019-06-27 DIAGNOSIS — G8929 Other chronic pain: Secondary | ICD-10-CM

## 2019-06-27 DIAGNOSIS — M5090 Cervical disc disorder, unspecified, unspecified cervical region: Principal | ICD-10-CM

## 2019-07-02 MED ORDER — POLYETHYLENE GLYCOL 3350 17 GRAM/DOSE ORAL POWDER
Freq: Two times a day (BID) | ORAL | 1 refills | 25 days | Status: CP
Start: 2019-07-02 — End: ?

## 2019-07-02 MED ORDER — SENNOSIDES 8.6 MG TABLET
ORAL_TABLET | Freq: Every day | ORAL | 11 refills | 30.00000 days
Start: 2019-07-02 — End: 2020-07-01

## 2019-07-03 ENCOUNTER — Encounter
Admit: 2019-07-03 | Discharge: 2019-07-04 | Payer: MEDICARE | Attending: Student in an Organized Health Care Education/Training Program | Primary: Student in an Organized Health Care Education/Training Program

## 2019-07-03 DIAGNOSIS — I10 Essential (primary) hypertension: Principal | ICD-10-CM

## 2019-07-03 DIAGNOSIS — M5090 Cervical disc disorder, unspecified, unspecified cervical region: Principal | ICD-10-CM

## 2019-07-03 DIAGNOSIS — R519 Nonintractable headache, unspecified chronicity pattern, unspecified headache type: Principal | ICD-10-CM

## 2019-07-03 DIAGNOSIS — R1310 Dysphagia, unspecified: Principal | ICD-10-CM

## 2019-07-03 DIAGNOSIS — R159 Full incontinence of feces: Principal | ICD-10-CM

## 2019-07-03 MED ORDER — CALCIUM POLYCARBOPHIL 625 MG TABLET
ORAL_TABLET | Freq: Every day | ORAL | 3 refills | 90 days | Status: CP
Start: 2019-07-03 — End: 2020-07-02

## 2019-07-14 MED ORDER — OXYCODONE-ACETAMINOPHEN 5 MG-325 MG TABLET
ORAL_TABLET | ORAL | 0 refills | 25.00000 days | Status: CP | PRN
Start: 2019-07-14 — End: 2019-08-13

## 2019-07-21 ENCOUNTER — Encounter: Admit: 2019-07-21 | Discharge: 2019-07-22 | Payer: MEDICARE

## 2019-07-21 DIAGNOSIS — M5011 Cervical disc disorder with radiculopathy,  high cervical region: Secondary | ICD-10-CM | POA: Diagnosis not present

## 2019-07-21 DIAGNOSIS — M4722 Other spondylosis with radiculopathy, cervical region: Secondary | ICD-10-CM | POA: Diagnosis not present

## 2019-07-21 DIAGNOSIS — M4802 Spinal stenosis, cervical region: Secondary | ICD-10-CM | POA: Diagnosis not present

## 2019-07-21 DIAGNOSIS — J339 Nasal polyp, unspecified: Secondary | ICD-10-CM | POA: Diagnosis not present

## 2019-07-21 DIAGNOSIS — Q892 Congenital malformations of other endocrine glands: Secondary | ICD-10-CM | POA: Diagnosis not present

## 2019-07-21 DIAGNOSIS — M9971 Connective tissue and disc stenosis of intervertebral foramina of cervical region: Secondary | ICD-10-CM | POA: Diagnosis not present

## 2019-07-25 DIAGNOSIS — I10 Essential (primary) hypertension: Principal | ICD-10-CM

## 2019-07-28 ENCOUNTER — Encounter: Admit: 2019-07-28 | Discharge: 2019-07-29 | Payer: MEDICARE | Attending: Internal Medicine | Primary: Internal Medicine

## 2019-07-28 DIAGNOSIS — E1142 Type 2 diabetes mellitus with diabetic polyneuropathy: Principal | ICD-10-CM

## 2019-07-28 DIAGNOSIS — G8929 Other chronic pain: Secondary | ICD-10-CM

## 2019-07-28 DIAGNOSIS — M5090 Cervical disc disorder, unspecified, unspecified cervical region: Principal | ICD-10-CM

## 2019-07-28 DIAGNOSIS — I10 Essential (primary) hypertension: Principal | ICD-10-CM

## 2019-07-28 DIAGNOSIS — N1831 Chronic kidney disease, stage 3a: Secondary | ICD-10-CM | POA: Diagnosis not present

## 2019-07-28 DIAGNOSIS — M545 Low back pain: Secondary | ICD-10-CM

## 2019-07-28 MED ORDER — GABAPENTIN 300 MG CAPSULE
ORAL_CAPSULE | Freq: Three times a day (TID) | ORAL | 3 refills | 90 days | Status: CP
Start: 2019-07-28 — End: 2020-07-22

## 2019-07-28 MED ORDER — HYDROCHLOROTHIAZIDE 12.5 MG TABLET
ORAL_TABLET | Freq: Every day | ORAL | 0 refills | 30.00000 days
Start: 2019-07-28 — End: 2020-07-27

## 2019-07-28 MED ORDER — OXYCODONE-ACETAMINOPHEN 5 MG-325 MG TABLET
ORAL_TABLET | ORAL | 0 refills | 25 days | Status: CP | PRN
Start: 2019-07-28 — End: 2019-08-27

## 2019-07-28 MED ORDER — GLIPIZIDE 5 MG TABLET
ORAL_TABLET | Freq: Every day | ORAL | 1 refills | 90 days | Status: CP
Start: 2019-07-28 — End: ?

## 2019-08-10 DIAGNOSIS — M5090 Cervical disc disorder, unspecified, unspecified cervical region: Principal | ICD-10-CM

## 2019-08-10 DIAGNOSIS — M542 Cervicalgia: Principal | ICD-10-CM

## 2019-08-10 DIAGNOSIS — G8929 Other chronic pain: Principal | ICD-10-CM

## 2019-08-10 DIAGNOSIS — M545 Low back pain: Principal | ICD-10-CM

## 2019-08-12 DIAGNOSIS — I10 Essential (primary) hypertension: Principal | ICD-10-CM

## 2019-08-12 MED ORDER — GABAPENTIN 300 MG CAPSULE
ORAL_CAPSULE | 3 refills | 0 days | Status: CP
Start: 2019-08-12 — End: ?

## 2019-08-18 ENCOUNTER — Encounter: Admit: 2019-08-18 | Discharge: 2019-08-19 | Payer: MEDICARE

## 2019-08-18 ENCOUNTER — Ambulatory Visit
Admit: 2019-08-18 | Discharge: 2019-08-19 | Payer: MEDICARE | Attending: Orthopaedic Surgery | Primary: Orthopaedic Surgery

## 2019-08-18 ENCOUNTER — Ambulatory Visit: Admit: 2019-08-18 | Discharge: 2019-08-19 | Payer: MEDICARE

## 2019-08-18 DIAGNOSIS — M4802 Spinal stenosis, cervical region: Principal | ICD-10-CM

## 2019-08-18 DIAGNOSIS — M5090 Cervical disc disorder, unspecified, unspecified cervical region: Principal | ICD-10-CM

## 2019-08-18 DIAGNOSIS — Z85118 Personal history of other malignant neoplasm of bronchus and lung: Secondary | ICD-10-CM | POA: Diagnosis not present

## 2019-08-18 DIAGNOSIS — Z86010 Personal history of colonic polyps: Secondary | ICD-10-CM | POA: Diagnosis not present

## 2019-08-18 DIAGNOSIS — J449 Chronic obstructive pulmonary disease, unspecified: Secondary | ICD-10-CM | POA: Diagnosis not present

## 2019-08-18 DIAGNOSIS — Z9049 Acquired absence of other specified parts of digestive tract: Secondary | ICD-10-CM | POA: Diagnosis not present

## 2019-08-18 DIAGNOSIS — I10 Essential (primary) hypertension: Secondary | ICD-10-CM | POA: Diagnosis not present

## 2019-08-18 DIAGNOSIS — Z961 Presence of intraocular lens: Secondary | ICD-10-CM | POA: Diagnosis not present

## 2019-08-18 DIAGNOSIS — Z87442 Personal history of urinary calculi: Secondary | ICD-10-CM | POA: Diagnosis not present

## 2019-08-18 DIAGNOSIS — M50321 Other cervical disc degeneration at C4-C5 level: Secondary | ICD-10-CM | POA: Diagnosis not present

## 2019-08-18 DIAGNOSIS — Z79899 Other long term (current) drug therapy: Secondary | ICD-10-CM | POA: Diagnosis not present

## 2019-08-18 DIAGNOSIS — M419 Scoliosis, unspecified: Secondary | ICD-10-CM | POA: Diagnosis not present

## 2019-08-18 DIAGNOSIS — Z902 Acquired absence of lung [part of]: Secondary | ICD-10-CM | POA: Diagnosis not present

## 2019-08-18 DIAGNOSIS — G8929 Other chronic pain: Secondary | ICD-10-CM | POA: Diagnosis not present

## 2019-08-18 DIAGNOSIS — Z7984 Long term (current) use of oral hypoglycemic drugs: Secondary | ICD-10-CM | POA: Diagnosis not present

## 2019-08-18 DIAGNOSIS — H9190 Unspecified hearing loss, unspecified ear: Secondary | ICD-10-CM | POA: Diagnosis not present

## 2019-08-18 DIAGNOSIS — Z9842 Cataract extraction status, left eye: Secondary | ICD-10-CM | POA: Diagnosis not present

## 2019-08-18 DIAGNOSIS — E119 Type 2 diabetes mellitus without complications: Secondary | ICD-10-CM | POA: Diagnosis not present

## 2019-08-18 DIAGNOSIS — Z981 Arthrodesis status: Secondary | ICD-10-CM | POA: Diagnosis not present

## 2019-08-18 DIAGNOSIS — Z9841 Cataract extraction status, right eye: Secondary | ICD-10-CM | POA: Diagnosis not present

## 2019-08-21 ENCOUNTER — Ambulatory Visit: Admit: 2019-08-21 | Discharge: 2019-08-22 | Payer: MEDICARE

## 2019-08-21 DIAGNOSIS — I10 Essential (primary) hypertension: Principal | ICD-10-CM

## 2019-08-21 DIAGNOSIS — Z01818 Encounter for other preprocedural examination: Secondary | ICD-10-CM | POA: Diagnosis not present

## 2019-08-21 DIAGNOSIS — M50321 Other cervical disc degeneration at C4-C5 level: Secondary | ICD-10-CM | POA: Diagnosis not present

## 2019-08-21 DIAGNOSIS — M4802 Spinal stenosis, cervical region: Secondary | ICD-10-CM | POA: Diagnosis not present

## 2019-08-22 ENCOUNTER — Encounter: Admit: 2019-08-22 | Discharge: 2019-08-23 | Payer: MEDICARE

## 2019-08-22 DIAGNOSIS — I10 Essential (primary) hypertension: Secondary | ICD-10-CM | POA: Diagnosis not present

## 2019-08-22 DIAGNOSIS — E119 Type 2 diabetes mellitus without complications: Secondary | ICD-10-CM | POA: Diagnosis not present

## 2019-08-24 DIAGNOSIS — K219 Gastro-esophageal reflux disease without esophagitis: Principal | ICD-10-CM

## 2019-08-25 MED ORDER — OMEPRAZOLE 20 MG CAPSULE,DELAYED RELEASE
ORAL_CAPSULE | 3 refills | 0 days | Status: CP
Start: 2019-08-25 — End: ?

## 2019-08-29 ENCOUNTER — Encounter: Admit: 2019-08-29 | Discharge: 2019-08-29 | Payer: MEDICARE | Attending: Internal Medicine | Primary: Internal Medicine

## 2019-08-29 ENCOUNTER — Encounter: Admit: 2019-08-29 | Discharge: 2019-08-29 | Payer: MEDICARE

## 2019-08-29 DIAGNOSIS — I10 Essential (primary) hypertension: Principal | ICD-10-CM

## 2019-08-29 DIAGNOSIS — M545 Low back pain: Principal | ICD-10-CM

## 2019-08-29 DIAGNOSIS — M5090 Cervical disc disorder, unspecified, unspecified cervical region: Principal | ICD-10-CM

## 2019-08-29 DIAGNOSIS — G8929 Other chronic pain: Principal | ICD-10-CM

## 2019-08-29 DIAGNOSIS — E1142 Type 2 diabetes mellitus with diabetic polyneuropathy: Secondary | ICD-10-CM | POA: Diagnosis not present

## 2019-08-29 MED ORDER — OXYCODONE-ACETAMINOPHEN 5 MG-325 MG TABLET
ORAL_TABLET | ORAL | 0 refills | 25.00000 days | Status: CP | PRN
Start: 2019-08-29 — End: 2019-09-28

## 2019-08-29 MED ORDER — AMLODIPINE 5 MG TABLET
ORAL_TABLET | Freq: Every day | ORAL | 0 refills | 30 days
Start: 2019-08-29 — End: 2020-08-28

## 2019-09-12 ENCOUNTER — Encounter
Admit: 2019-09-12 | Discharge: 2019-09-13 | Payer: MEDICARE | Attending: Orthopaedic Surgery | Primary: Orthopaedic Surgery

## 2019-09-12 DIAGNOSIS — M4802 Spinal stenosis, cervical region: Secondary | ICD-10-CM | POA: Diagnosis not present

## 2019-09-22 DIAGNOSIS — I10 Essential (primary) hypertension: Principal | ICD-10-CM

## 2019-09-22 MED ORDER — LISINOPRIL 10 MG TABLET
ORAL_TABLET | Freq: Every day | ORAL | 0 refills | 30 days | Status: CP
Start: 2019-09-22 — End: 2019-10-22

## 2019-09-29 ENCOUNTER — Ambulatory Visit: Admit: 2019-09-29 | Discharge: 2019-09-30 | Payer: MEDICARE | Attending: Internal Medicine | Primary: Internal Medicine

## 2019-09-29 DIAGNOSIS — M545 Low back pain: Secondary | ICD-10-CM

## 2019-09-29 DIAGNOSIS — R109 Unspecified abdominal pain: Principal | ICD-10-CM

## 2019-09-29 DIAGNOSIS — G8929 Other chronic pain: Secondary | ICD-10-CM

## 2019-09-29 DIAGNOSIS — I10 Essential (primary) hypertension: Principal | ICD-10-CM

## 2019-09-29 DIAGNOSIS — M5090 Cervical disc disorder, unspecified, unspecified cervical region: Principal | ICD-10-CM

## 2019-09-29 MED ORDER — OXYCODONE-ACETAMINOPHEN 5 MG-325 MG TABLET
ORAL_TABLET | ORAL | 0 refills | 25.00000 days | Status: CP | PRN
Start: 2019-09-29 — End: 2019-10-29

## 2019-09-30 DIAGNOSIS — M79602 Pain in left arm: Principal | ICD-10-CM

## 2019-09-30 DIAGNOSIS — G959 Disease of spinal cord, unspecified: Principal | ICD-10-CM

## 2019-09-30 DIAGNOSIS — M79601 Pain in right arm: Principal | ICD-10-CM

## 2019-09-30 MED ORDER — CHLORHEXIDINE GLUCONATE 4 % TOPICAL LIQUID
Freq: Two times a day (BID) | TOPICAL | 0 refills | 59.00000 days | Status: CP
Start: 2019-09-30 — End: 2019-10-03

## 2019-09-30 MED ORDER — MUPIROCIN 2 % TOPICAL OINTMENT
Freq: Two times a day (BID) | TOPICAL | 0 refills | 0 days | Status: CP
Start: 2019-09-30 — End: 2019-10-03

## 2019-10-03 DIAGNOSIS — T84226A Displacement of internal fixation device of vertebrae, initial encounter: Principal | ICD-10-CM

## 2019-10-03 DIAGNOSIS — M4802 Spinal stenosis, cervical region: Principal | ICD-10-CM

## 2019-10-03 DIAGNOSIS — G959 Disease of spinal cord, unspecified: Principal | ICD-10-CM

## 2019-10-04 ENCOUNTER — Other Ambulatory Visit: Payer: Self-pay

## 2019-10-04 ENCOUNTER — Emergency Department: Payer: Medicare Other

## 2019-10-04 ENCOUNTER — Encounter: Payer: Self-pay | Admitting: Emergency Medicine

## 2019-10-04 ENCOUNTER — Emergency Department
Admission: EM | Admit: 2019-10-04 | Discharge: 2019-10-04 | Disposition: A | Discharge status: Home / Self Care | Payer: Medicare Other | Attending: Emergency Medicine | Admitting: Emergency Medicine

## 2019-10-04 DIAGNOSIS — S60932A Unspecified superficial injury of left thumb, initial encounter: Secondary | ICD-10-CM | POA: Diagnosis present

## 2019-10-04 DIAGNOSIS — N189 Chronic kidney disease, unspecified: Secondary | ICD-10-CM | POA: Insufficient documentation

## 2019-10-04 DIAGNOSIS — S61012A Laceration without foreign body of left thumb without damage to nail, initial encounter: Secondary | ICD-10-CM | POA: Insufficient documentation

## 2019-10-04 DIAGNOSIS — F17220 Nicotine dependence, chewing tobacco, uncomplicated: Secondary | ICD-10-CM | POA: Diagnosis not present

## 2019-10-04 DIAGNOSIS — I129 Hypertensive chronic kidney disease with stage 1 through stage 4 chronic kidney disease, or unspecified chronic kidney disease: Secondary | ICD-10-CM | POA: Insufficient documentation

## 2019-10-04 DIAGNOSIS — Z7984 Long term (current) use of oral hypoglycemic drugs: Secondary | ICD-10-CM | POA: Insufficient documentation

## 2019-10-04 DIAGNOSIS — Z85118 Personal history of other malignant neoplasm of bronchus and lung: Secondary | ICD-10-CM | POA: Insufficient documentation

## 2019-10-04 DIAGNOSIS — W278XXA Contact with other nonpowered hand tool, initial encounter: Secondary | ICD-10-CM | POA: Insufficient documentation

## 2019-10-04 DIAGNOSIS — Z79899 Other long term (current) drug therapy: Secondary | ICD-10-CM | POA: Diagnosis not present

## 2019-10-04 DIAGNOSIS — Y929 Unspecified place or not applicable: Secondary | ICD-10-CM | POA: Insufficient documentation

## 2019-10-04 DIAGNOSIS — Y999 Unspecified external cause status: Secondary | ICD-10-CM | POA: Diagnosis not present

## 2019-10-04 DIAGNOSIS — E1122 Type 2 diabetes mellitus with diabetic chronic kidney disease: Secondary | ICD-10-CM | POA: Diagnosis not present

## 2019-10-04 DIAGNOSIS — S6702XA Crushing injury of left thumb, initial encounter: Secondary | ICD-10-CM | POA: Diagnosis not present

## 2019-10-04 DIAGNOSIS — J449 Chronic obstructive pulmonary disease, unspecified: Secondary | ICD-10-CM | POA: Insufficient documentation

## 2019-10-04 DIAGNOSIS — Y939 Activity, unspecified: Secondary | ICD-10-CM | POA: Insufficient documentation

## 2019-10-04 DIAGNOSIS — S6992XA Unspecified injury of left wrist, hand and finger(s), initial encounter: Secondary | ICD-10-CM | POA: Diagnosis not present

## 2019-10-04 NOTE — ED Triage Notes (Signed)
Pt states hit thumb yesterday with a hammer and busted it open. Bandage applied PTA, no bleeding noted with bandage in place.

## 2019-10-04 NOTE — Discharge Instructions (Addendum)
You have a smash injury to the thumb. There is no evidence of a fracture. You have had you laceration repaired with Steri-strips. They will remain in place to help the skin heal. Follow-up with your provider or this ED as needed.

## 2019-10-04 NOTE — ED Notes (Signed)
Declined discharge vital signs.

## 2019-10-04 NOTE — ED Provider Notes (Signed)
[facility] Emergency Department Provider Note ____________________________________________  Time seen: 1224  I have reviewed the triage vital signs and the nursing notes.  HISTORY  Chief Complaint  Thumb Injury  HPI [name] is a 74 y.o. male presents himself to the ED for evaluation of a left thumb injury.  Patient apparently hit his left thumb with a hammer yesterday, presents today with pain and swelling.   He reports a laceration caused by the contusion to the thumb fat pad.  Large linear laceration and soft tissue swelling noted.  No active bleeding is appreciated.  Past Medical History:  Diagnosis Date  . Arm pain    r/t neck issues-bilateral  . Arthritis   . Chronic kidney disease    slow to start when urinating  . COPD (chronic obstructive pulmonary disease) ([place])   . Diabetes mellitus    Metformin and Glipizide  . GERD (gastroesophageal reflux disease)    takes Omeprazole daily  . H/O: lung cancer   . Headache(784.0)   . History of colon polyps   . Hypertension   . Kidney stones    hx of    Patient Active Problem List   Diagnosis Date Noted  . Herniated cervical disc without myelopathy 02/17/2011    Past Surgical History:  Procedure Laterality Date  . ANTERIOR CERVICAL DECOMP/DISCECTOMY FUSION  02/17/2011   Procedure: ANTERIOR CERVICAL DECOMPRESSION/DISCECTOMY FUSION 2 LEVELS;  Surgeon: Olga Coaster Kritzer;  Location: [place] [facility];  Service: Neurosurgery;  Laterality: N/A;  Anterior Cervical Five-Six, Six-Seven Decompression and Fusion  . CATARACT EXTRACTION  4-75yrs ago   bilateral  . CHOLECYSTECTOMY  90's  . COLONOSCOPY    . CYSTOSCOPY W/ URETERAL STENT PLACEMENT  2011  . HERNIA REPAIR     as a child  . LOBECTOMY  90's   left lower lobe d/t lung cancer    Prior to Admission medications   Medication Sig Start Date End Date Taking? Authorizing Provider  amLODipine (NORVASC) 2.5 MG tablet Take 2.5 mg by mouth at bedtime.       [provider]  atenolol (TENORMIN) 50 MG tablet Take 25-50 mg by mouth daily. Takes 1/2 tab (25 mg) in the morning, and 1 tablet (50 mg) at bedtime    [provider]  enalapril-hydrochlorothiazide (VASERETIC) 10-25 MG per tablet Take 1 tablet by mouth daily.      [provider]  gabapentin (NEURONTIN) 100 MG capsule Take 100 mg by mouth 3 (three) times daily.      [provider]  glipiZIDE (GLUCOTROL) 10 MG tablet Take 20 mg by mouth 2 (two) times daily before a meal.      [provider]  metFORMIN (GLUCOPHAGE) 500 MG tablet Take 500 mg by mouth 2 (two) times daily with a meal.      [provider]  omeprazole (PRILOSEC) 20 MG capsule Take 20 mg by mouth daily.      [provider]  ondansetron (ZOFRAN ODT) 4 MG disintegrating tablet Take 1 tablet (4 mg total) by mouth every 8 (eight) hours as needed for nausea or vomiting. 03/08/19   Harvest Dark, MD  oxyCODONE-acetaminophen (PERCOCET) 5-325 MG per tablet Take 1 tablet by mouth daily as needed. For pain    [provider]    Allergies Patient has no known allergies.  Family History  Problem Relation Age of Onset  . Anesthesia problems Neg Hx   . Hypotension Neg Hx   . Malignant hyperthermia  Neg Hx   . Pseudochol deficiency Neg Hx     Social History Social History   Tobacco Use  . Smoking status: Former Smoker    Years: 40.00  . Smokeless tobacco: Current User    Types: Chew  Substance Use Topics  . Alcohol use: No  . Drug use: No    Review of Systems  Constitutional: Negative for fever. Cardiovascular: Negative for chest pain. Respiratory: Negative for shortness of breath. Musculoskeletal: Negative for back pain.  Left thumb laceration as above. Skin: Negative for rash. Neurological: Negative for headaches, focal weakness or numbness. ____________________________________________  PHYSICAL EXAM:  VITAL SIGNS: ED Triage Vitals  Enc  Vitals Group     BP 10/04/19 1058 (!) 169/90     Pulse Rate 10/04/19 1058 75     Resp 10/04/19 1058 20     Temp 10/04/19 1058 98.6 F (37 C)     Temp Source 10/04/19 1058 Oral     SpO2 10/04/19 1058 97 %     Weight 10/04/19 1059 140 lb (63.5 kg)     Height 10/04/19 1059 5\' 5"  (1.651 m)     Head Circumference --      Peak Flow --      Pain Score 10/04/19 1058 3     Pain Loc --      Pain Edu? --      Excl. in [place]? --     Constitutional: Alert and oriented. Well appearing and in no distress. Head: Normocephalic and atraumatic. Eyes: Conjunctivae are normal. Normal extraocular movements Cardiovascular: Normal rate, regular rhythm. Normal distal pulses. Respiratory: Normal respiratory effort.  Musculoskeletal: No deformity or dislocation noted to the left thumb.  left thumb laceration to the thumb fat pad. Normal composite fist on exam. Nontender with normal range of motion in all extremities.  Neurologic:  Normal gross sensation. Normal speech and language. No gross focal neurologic deficits are appreciated. Skin:  Skin is warm, dry and intact. No rash noted. ___________________________________________   RADIOLOGY  DG Left Thumb  IMPRESSION: No acute osseous findings. Possible scattered small foreign bodies within the soft tissues of the thumb ____________________________________________  PROCEDURES  .Marland KitchenLaceration Repair  Date/Time: 10/04/2019 1:06 PM Performed by: Melvenia Needles, PA-C Authorized by: Melvenia Needles, PA-C   Consent:    Consent obtained:  Verbal   Consent given by:  Patient   Risks discussed:  Pain and poor wound healing   Alternatives discussed:  No treatment Anesthesia (see MAR for exact dosages):    Anesthesia method:  None Laceration details:    Location:  Finger   Finger location:  L thumb   Length (cm):  2.5   Depth (mm):  2 Repair type:    Repair type:  Simple Exploration:    Contaminated: no   Treatment:    Area cleansed  with:  Saline and soap and water   Amount of cleaning:  Standard Skin repair:    Repair method:  Steri-Strips   Number of Steri-Strips:  4 Approximation:    Approximation:  Close Post-procedure details:    Dressing:  Adhesive bandage   Patient tolerance of procedure:  Tolerated well, no immediate complications  ____________________________________________  INITIAL IMPRESSION / ASSESSMENT AND PLAN / ED COURSE  Patient with ED evaluation of an accidental contusion to the left thumb.  Patient presents 24 hours after the accidental self-inflicted injury.  No radiologic evidence of fracture or dislocation.  Patient presents with soft tissue swelling and  laceration to the thumb pad.  Given the greater than 20 hours since the initial injury, patient superficial wound is closed using Steri-Strips.  Good wound edge approximation is achieved rating discharged with wound care instructions and supplies.  He will follow-up with his primary provider for ongoing symptoms.  He has decided to defer his Tdap until his preop visit next month.  [name] was evaluated in Emergency Department on 10/04/2019 for the symptoms described in the history of present illness. He was evaluated in the context of the global COVID-19 pandemic, which necessitated consideration that the patient might be at risk for infection with the SARS-CoV-2 virus that causes COVID-19. Institutional protocols and algorithms that pertain to the evaluation of patients at risk for COVID-19 are in a state of rapid change based on information released by regulatory bodies including the CDC and federal and state organizations. These policies and algorithms were followed during the patient's care in the ED. ____________________________________________  FINAL CLINICAL IMPRESSION(S) / ED DIAGNOSES  Final diagnoses:  Laceration of left thumb without foreign body without damage to nail, initial encounter  Crush injury to thumb, left, initial  encounter      Melvenia Needles, PA-C 10/04/19 [place]    Earleen Newport, MD 10/05/19 [phone]

## 2019-10-04 NOTE — ED Notes (Signed)
First Nurse Note: Pt to ED for left thumb injury. Pt states that he hit it with a hammer yesterday. Pt is in NAD.

## 2019-10-20 ENCOUNTER — Ambulatory Visit: Admit: 2019-10-20 | Discharge: 2019-10-20 | Payer: MEDICARE | Attending: Internal Medicine | Primary: Internal Medicine

## 2019-10-20 ENCOUNTER — Encounter
Admit: 2019-10-20 | Discharge: 2019-10-20 | Payer: MEDICARE | Attending: Orthopaedic Surgery | Primary: Orthopaedic Surgery

## 2019-10-20 ENCOUNTER — Encounter: Admit: 2019-10-20 | Discharge: 2019-10-20 | Payer: MEDICARE

## 2019-10-20 DIAGNOSIS — E785 Hyperlipidemia, unspecified: Principal | ICD-10-CM

## 2019-10-20 DIAGNOSIS — T84226A Displacement of internal fixation device of vertebrae, initial encounter: Principal | ICD-10-CM

## 2019-10-20 DIAGNOSIS — Z01818 Encounter for other preprocedural examination: Principal | ICD-10-CM

## 2019-10-20 DIAGNOSIS — M79602 Pain in left arm: Principal | ICD-10-CM

## 2019-10-20 DIAGNOSIS — E1142 Type 2 diabetes mellitus with diabetic polyneuropathy: Principal | ICD-10-CM

## 2019-10-20 DIAGNOSIS — N1831 Chronic kidney disease, stage 3a: Secondary | ICD-10-CM | POA: Diagnosis not present

## 2019-10-20 DIAGNOSIS — M4802 Spinal stenosis, cervical region: Principal | ICD-10-CM

## 2019-10-20 DIAGNOSIS — K219 Gastro-esophageal reflux disease without esophagitis: Principal | ICD-10-CM

## 2019-10-20 DIAGNOSIS — J4520 Mild intermittent asthma, uncomplicated: Principal | ICD-10-CM

## 2019-10-20 DIAGNOSIS — M79601 Pain in right arm: Principal | ICD-10-CM

## 2019-10-20 DIAGNOSIS — I10 Essential (primary) hypertension: Principal | ICD-10-CM

## 2019-10-20 DIAGNOSIS — J449 Chronic obstructive pulmonary disease, unspecified: Principal | ICD-10-CM

## 2019-10-20 DIAGNOSIS — G959 Disease of spinal cord, unspecified: Principal | ICD-10-CM

## 2019-10-21 ENCOUNTER — Encounter: Admit: 2019-10-21 | Discharge: 2019-10-22 | Payer: MEDICARE | Attending: Internal Medicine | Primary: Internal Medicine

## 2019-10-21 ENCOUNTER — Encounter: Admit: 2019-10-21 | Discharge: 2019-10-22 | Payer: MEDICARE

## 2019-10-21 DIAGNOSIS — N1831 Chronic kidney disease, stage 3a: Secondary | ICD-10-CM | POA: Diagnosis not present

## 2019-10-21 DIAGNOSIS — R809 Proteinuria, unspecified: Principal | ICD-10-CM

## 2019-10-21 DIAGNOSIS — I10 Essential (primary) hypertension: Principal | ICD-10-CM

## 2019-10-21 DIAGNOSIS — M5090 Cervical disc disorder, unspecified, unspecified cervical region: Principal | ICD-10-CM

## 2019-10-21 DIAGNOSIS — I5189 Other ill-defined heart diseases: Secondary | ICD-10-CM | POA: Diagnosis not present

## 2019-10-21 DIAGNOSIS — I129 Hypertensive chronic kidney disease with stage 1 through stage 4 chronic kidney disease, or unspecified chronic kidney disease: Secondary | ICD-10-CM | POA: Diagnosis not present

## 2019-10-21 MED ORDER — AMLODIPINE 5 MG TABLET
ORAL_TABLET | Freq: Every day | ORAL | 1 refills | 30 days | Status: CP
Start: 2019-10-21 — End: 2019-12-20

## 2019-10-22 DIAGNOSIS — I10 Essential (primary) hypertension: Principal | ICD-10-CM

## 2019-10-23 MED ORDER — LISINOPRIL 10 MG TABLET
ORAL_TABLET | 0 refills | 0 days | Status: CP
Start: 2019-10-23 — End: ?

## 2019-10-28 ENCOUNTER — Encounter: Admit: 2019-10-28 | Discharge: 2019-10-28 | Payer: MEDICARE | Attending: Internal Medicine | Primary: Internal Medicine

## 2019-10-28 DIAGNOSIS — I10 Essential (primary) hypertension: Principal | ICD-10-CM

## 2019-10-28 DIAGNOSIS — G8929 Other chronic pain: Secondary | ICD-10-CM

## 2019-10-28 DIAGNOSIS — M545 Low back pain: Principal | ICD-10-CM

## 2019-10-28 DIAGNOSIS — M5090 Cervical disc disorder, unspecified, unspecified cervical region: Principal | ICD-10-CM

## 2019-10-29 ENCOUNTER — Encounter: Admit: 2019-10-29 | Discharge: 2019-10-30 | Payer: MEDICARE | Attending: Internal Medicine | Primary: Internal Medicine

## 2019-10-31 ENCOUNTER — Encounter: Admit: 2019-10-31 | Discharge: 2019-11-01 | Payer: MEDICARE

## 2019-10-31 DIAGNOSIS — I10 Essential (primary) hypertension: Principal | ICD-10-CM

## 2019-11-03 MED ORDER — OXYCODONE-ACETAMINOPHEN 5 MG-325 MG TABLET
ORAL_TABLET | ORAL | 0 refills | 25.00000 days | Status: CP | PRN
Start: 2019-11-03 — End: 2019-12-03

## 2019-11-06 ENCOUNTER — Ambulatory Visit
Admit: 2019-11-06 | Discharge: 2019-11-09 | Disposition: A | Discharge status: Home / Self Care | Payer: MEDICARE | Admitting: Orthopaedic Surgery

## 2019-11-06 ENCOUNTER — Encounter
Admit: 2019-11-06 | Discharge: 2019-11-09 | Disposition: A | Discharge status: Home / Self Care | Payer: MEDICARE | Admitting: Orthopaedic Surgery

## 2019-11-06 DIAGNOSIS — M5090 Cervical disc disorder, unspecified, unspecified cervical region: Secondary | ICD-10-CM | POA: Diagnosis not present

## 2019-11-06 DIAGNOSIS — S129XXA Fracture of neck, unspecified, initial encounter: Secondary | ICD-10-CM | POA: Diagnosis not present

## 2019-11-06 DIAGNOSIS — Z85118 Personal history of other malignant neoplasm of bronchus and lung: Secondary | ICD-10-CM | POA: Diagnosis not present

## 2019-11-06 DIAGNOSIS — Z79899 Other long term (current) drug therapy: Secondary | ICD-10-CM | POA: Diagnosis not present

## 2019-11-06 DIAGNOSIS — Z9849 Cataract extraction status, unspecified eye: Secondary | ICD-10-CM | POA: Diagnosis not present

## 2019-11-06 DIAGNOSIS — K219 Gastro-esophageal reflux disease without esophagitis: Secondary | ICD-10-CM | POA: Diagnosis not present

## 2019-11-06 DIAGNOSIS — E785 Hyperlipidemia, unspecified: Secondary | ICD-10-CM | POA: Diagnosis not present

## 2019-11-06 DIAGNOSIS — I10 Essential (primary) hypertension: Secondary | ICD-10-CM | POA: Diagnosis not present

## 2019-11-06 DIAGNOSIS — G4489 Other headache syndrome: Secondary | ICD-10-CM | POA: Diagnosis not present

## 2019-11-06 DIAGNOSIS — Z8669 Personal history of other diseases of the nervous system and sense organs: Secondary | ICD-10-CM | POA: Diagnosis not present

## 2019-11-06 DIAGNOSIS — G8929 Other chronic pain: Secondary | ICD-10-CM | POA: Diagnosis not present

## 2019-11-06 DIAGNOSIS — Z9049 Acquired absence of other specified parts of digestive tract: Secondary | ICD-10-CM | POA: Diagnosis not present

## 2019-11-06 DIAGNOSIS — E1151 Type 2 diabetes mellitus with diabetic peripheral angiopathy without gangrene: Secondary | ICD-10-CM | POA: Diagnosis not present

## 2019-11-06 DIAGNOSIS — Z86010 Personal history of colonic polyps: Secondary | ICD-10-CM | POA: Diagnosis not present

## 2019-11-06 DIAGNOSIS — J449 Chronic obstructive pulmonary disease, unspecified: Secondary | ICD-10-CM | POA: Diagnosis not present

## 2019-11-06 DIAGNOSIS — Z902 Acquired absence of lung [part of]: Secondary | ICD-10-CM | POA: Diagnosis not present

## 2019-11-06 DIAGNOSIS — M4722 Other spondylosis with radiculopathy, cervical region: Secondary | ICD-10-CM | POA: Diagnosis not present

## 2019-11-06 DIAGNOSIS — J309 Allergic rhinitis, unspecified: Secondary | ICD-10-CM | POA: Diagnosis not present

## 2019-11-06 DIAGNOSIS — Z7984 Long term (current) use of oral hypoglycemic drugs: Secondary | ICD-10-CM | POA: Diagnosis not present

## 2019-11-06 DIAGNOSIS — E1122 Type 2 diabetes mellitus with diabetic chronic kidney disease: Secondary | ICD-10-CM | POA: Diagnosis not present

## 2019-11-06 DIAGNOSIS — Z981 Arthrodesis status: Secondary | ICD-10-CM | POA: Diagnosis not present

## 2019-11-06 DIAGNOSIS — T84226A Displacement of internal fixation device of vertebrae, initial encounter: Secondary | ICD-10-CM | POA: Diagnosis not present

## 2019-11-06 DIAGNOSIS — H04123 Dry eye syndrome of bilateral lacrimal glands: Secondary | ICD-10-CM | POA: Diagnosis not present

## 2019-11-06 DIAGNOSIS — N189 Chronic kidney disease, unspecified: Secondary | ICD-10-CM | POA: Diagnosis not present

## 2019-11-06 DIAGNOSIS — I129 Hypertensive chronic kidney disease with stage 1 through stage 4 chronic kidney disease, or unspecified chronic kidney disease: Secondary | ICD-10-CM | POA: Diagnosis not present

## 2019-11-06 DIAGNOSIS — M4712 Other spondylosis with myelopathy, cervical region: Secondary | ICD-10-CM | POA: Diagnosis not present

## 2019-11-06 DIAGNOSIS — M4802 Spinal stenosis, cervical region: Secondary | ICD-10-CM | POA: Diagnosis not present

## 2019-11-06 DIAGNOSIS — Z87891 Personal history of nicotine dependence: Secondary | ICD-10-CM | POA: Diagnosis not present

## 2019-11-06 MED ORDER — POLYETHYLENE GLYCOL 3350 17 GRAM ORAL POWDER PACKET
PACK | Freq: Every day | ORAL | 0 refills | 7.00000 days | Status: CP
Start: 2019-11-06 — End: 2019-11-13
  Filled 2019-11-09: qty 119, 7d supply, fill #0

## 2019-11-06 MED ORDER — OXYCODONE 5 MG TABLET
ORAL_TABLET | Freq: Four times a day (QID) | ORAL | 0 refills | 7 days | Status: CP | PRN
Start: 2019-11-06 — End: 2019-11-13

## 2019-11-06 MED ORDER — ACETAMINOPHEN 500 MG TABLET
ORAL_TABLET | Freq: Three times a day (TID) | ORAL | 0 refills | 7.00000 days | Status: CP
Start: 2019-11-06 — End: 2019-11-13
  Filled 2019-11-09: qty 42, 7d supply, fill #0

## 2019-11-06 MED ORDER — SENNOSIDES 8.6 MG TABLET
ORAL_TABLET | Freq: Every evening | ORAL | 0 refills | 10 days | Status: CP | PRN
Start: 2019-11-06 — End: 2019-11-16
  Filled 2019-11-09: qty 20, 10d supply, fill #0

## 2019-11-09 MED FILL — CLEARLAX 17 GRAM/DOSE ORAL POWDER: 7 days supply | Qty: 119 | Fill #0 | Status: AC

## 2019-11-09 MED FILL — ACETAMINOPHEN 500 MG TABLET: 7 days supply | Qty: 42 | Fill #0 | Status: AC

## 2019-11-09 MED FILL — SENNA 8.6 MG TABLET: 10 days supply | Qty: 20 | Fill #0 | Status: AC

## 2019-11-10 DIAGNOSIS — Z09 Encounter for follow-up examination after completed treatment for conditions other than malignant neoplasm: Principal | ICD-10-CM

## 2019-11-11 DIAGNOSIS — J449 Chronic obstructive pulmonary disease, unspecified: Principal | ICD-10-CM

## 2019-11-12 MED ORDER — ALBUTEROL SULFATE HFA 90 MCG/ACTUATION AEROSOL INHALER
3 refills | 0 days | Status: CP
Start: 2019-11-12 — End: ?

## 2019-11-13 DIAGNOSIS — I10 Essential (primary) hypertension: Principal | ICD-10-CM

## 2019-11-13 MED ORDER — AMLODIPINE 5 MG TABLET
ORAL_TABLET | 3 refills | 0 days | Status: CP
Start: 2019-11-13 — End: ?

## 2019-11-20 DIAGNOSIS — N400 Enlarged prostate without lower urinary tract symptoms: Principal | ICD-10-CM

## 2019-11-20 MED ORDER — TAMSULOSIN 0.4 MG CAPSULE
ORAL_CAPSULE | ORAL | 3 refills | 0.00000 days | Status: CP
Start: 2019-11-20 — End: ?

## 2019-11-20 MED ORDER — FINASTERIDE 5 MG TABLET
ORAL_TABLET | 3 refills | 0 days | Status: CP
Start: 2019-11-20 — End: ?

## 2019-11-22 DIAGNOSIS — I10 Essential (primary) hypertension: Principal | ICD-10-CM

## 2019-11-24 ENCOUNTER — Encounter: Admit: 2019-11-24 | Discharge: 2019-11-24 | Payer: MEDICARE

## 2019-11-24 ENCOUNTER — Encounter
Admit: 2019-11-24 | Discharge: 2019-11-24 | Payer: MEDICARE | Attending: Orthopaedic Surgery | Primary: Orthopaedic Surgery

## 2019-11-24 DIAGNOSIS — M4802 Spinal stenosis, cervical region: Principal | ICD-10-CM

## 2019-11-24 DIAGNOSIS — Z7984 Long term (current) use of oral hypoglycemic drugs: Secondary | ICD-10-CM | POA: Diagnosis not present

## 2019-11-24 DIAGNOSIS — Z981 Arthrodesis status: Secondary | ICD-10-CM | POA: Diagnosis not present

## 2019-11-24 DIAGNOSIS — M79601 Pain in right arm: Secondary | ICD-10-CM | POA: Diagnosis not present

## 2019-11-24 DIAGNOSIS — Z902 Acquired absence of lung [part of]: Secondary | ICD-10-CM | POA: Diagnosis not present

## 2019-11-24 DIAGNOSIS — C3490 Malignant neoplasm of unspecified part of unspecified bronchus or lung: Secondary | ICD-10-CM | POA: Diagnosis not present

## 2019-11-24 DIAGNOSIS — G959 Disease of spinal cord, unspecified: Secondary | ICD-10-CM | POA: Diagnosis not present

## 2019-11-24 DIAGNOSIS — Z79891 Long term (current) use of opiate analgesic: Secondary | ICD-10-CM | POA: Diagnosis not present

## 2019-11-24 DIAGNOSIS — N189 Chronic kidney disease, unspecified: Secondary | ICD-10-CM | POA: Diagnosis not present

## 2019-11-24 DIAGNOSIS — E1151 Type 2 diabetes mellitus with diabetic peripheral angiopathy without gangrene: Secondary | ICD-10-CM | POA: Diagnosis not present

## 2019-11-24 DIAGNOSIS — M25519 Pain in unspecified shoulder: Secondary | ICD-10-CM | POA: Diagnosis not present

## 2019-11-24 DIAGNOSIS — Z9049 Acquired absence of other specified parts of digestive tract: Secondary | ICD-10-CM | POA: Diagnosis not present

## 2019-11-24 DIAGNOSIS — Z961 Presence of intraocular lens: Secondary | ICD-10-CM | POA: Diagnosis not present

## 2019-11-24 DIAGNOSIS — Z79899 Other long term (current) drug therapy: Secondary | ICD-10-CM | POA: Diagnosis not present

## 2019-11-24 DIAGNOSIS — E1122 Type 2 diabetes mellitus with diabetic chronic kidney disease: Secondary | ICD-10-CM | POA: Diagnosis not present

## 2019-11-24 DIAGNOSIS — G8929 Other chronic pain: Secondary | ICD-10-CM | POA: Diagnosis not present

## 2019-11-24 DIAGNOSIS — M79602 Pain in left arm: Secondary | ICD-10-CM | POA: Diagnosis not present

## 2019-11-24 DIAGNOSIS — Z86010 Personal history of colonic polyps: Secondary | ICD-10-CM | POA: Diagnosis not present

## 2019-11-24 DIAGNOSIS — K219 Gastro-esophageal reflux disease without esophagitis: Secondary | ICD-10-CM | POA: Diagnosis not present

## 2019-11-24 DIAGNOSIS — H9190 Unspecified hearing loss, unspecified ear: Secondary | ICD-10-CM | POA: Diagnosis not present

## 2019-11-24 DIAGNOSIS — J449 Chronic obstructive pulmonary disease, unspecified: Secondary | ICD-10-CM | POA: Diagnosis not present

## 2019-11-24 DIAGNOSIS — I129 Hypertensive chronic kidney disease with stage 1 through stage 4 chronic kidney disease, or unspecified chronic kidney disease: Secondary | ICD-10-CM | POA: Diagnosis not present

## 2019-11-24 DIAGNOSIS — Z87891 Personal history of nicotine dependence: Secondary | ICD-10-CM | POA: Diagnosis not present

## 2019-11-24 DIAGNOSIS — M419 Scoliosis, unspecified: Secondary | ICD-10-CM | POA: Diagnosis not present

## 2019-11-24 MED ORDER — LISINOPRIL 10 MG TABLET
ORAL_TABLET | ORAL | 0 refills | 0.00000 days | Status: CP
Start: 2019-11-24 — End: ?

## 2019-12-01 ENCOUNTER — Encounter: Admit: 2019-12-01 | Discharge: 2019-12-02 | Payer: MEDICARE | Attending: Internal Medicine | Primary: Internal Medicine

## 2019-12-01 DIAGNOSIS — G8929 Other chronic pain: Principal | ICD-10-CM

## 2019-12-01 DIAGNOSIS — M545 Low back pain: Secondary | ICD-10-CM

## 2019-12-01 DIAGNOSIS — E1142 Type 2 diabetes mellitus with diabetic polyneuropathy: Principal | ICD-10-CM

## 2019-12-01 DIAGNOSIS — M5090 Cervical disc disorder, unspecified, unspecified cervical region: Principal | ICD-10-CM

## 2019-12-01 DIAGNOSIS — Z09 Encounter for follow-up examination after completed treatment for conditions other than malignant neoplasm: Principal | ICD-10-CM

## 2019-12-01 MED ORDER — OXYCODONE-ACETAMINOPHEN 5 MG-325 MG TABLET
ORAL_TABLET | ORAL | 0 refills | 25.00000 days | Status: CP | PRN
Start: 2019-12-01 — End: 2019-12-31

## 2019-12-18 DIAGNOSIS — I10 Essential (primary) hypertension: Principal | ICD-10-CM

## 2019-12-18 MED ORDER — LISINOPRIL 10 MG TABLET
ORAL_TABLET | ORAL | 3 refills | 0.00000 days | Status: CP
Start: 2019-12-18 — End: ?

## 2019-12-19 ENCOUNTER — Ambulatory Visit: Admit: 2019-12-19 | Discharge: 2019-12-20 | Payer: MEDICARE

## 2019-12-19 DIAGNOSIS — N1831 Chronic kidney disease, stage 3a: Secondary | ICD-10-CM | POA: Diagnosis not present

## 2019-12-19 DIAGNOSIS — I10 Essential (primary) hypertension: Secondary | ICD-10-CM | POA: Diagnosis not present

## 2019-12-29 MED ORDER — OXYCODONE-ACETAMINOPHEN 5 MG-325 MG TABLET
ORAL_TABLET | ORAL | 0 refills | 25 days | Status: CP | PRN
Start: 2019-12-29 — End: 2020-01-28

## 2020-01-09 ENCOUNTER — Ambulatory Visit: Admit: 2020-01-09 | Discharge: 2020-01-10 | Payer: MEDICARE | Attending: Pharmacotherapy | Primary: Pharmacotherapy

## 2020-01-09 DIAGNOSIS — I10 Essential (primary) hypertension: Principal | ICD-10-CM

## 2020-01-09 MED ORDER — AMLODIPINE 5 MG TABLET
ORAL_TABLET | Freq: Every evening | ORAL | 1 refills | 90 days | Status: CP
Start: 2020-01-09 — End: ?

## 2020-01-09 MED ORDER — DOXAZOSIN 2 MG TABLET
ORAL_TABLET | Freq: Every evening | ORAL | 1 refills | 90.00000 days | Status: CP
Start: 2020-01-09 — End: 2020-07-07

## 2020-01-09 MED ORDER — LISINOPRIL 20 MG TABLET
ORAL_TABLET | Freq: Every evening | ORAL | 1 refills | 90.00000 days | Status: CP
Start: 2020-01-09 — End: ?

## 2020-01-14 MED ORDER — OXYCODONE-ACETAMINOPHEN 5 MG-325 MG TABLET
ORAL_TABLET | ORAL | 0 refills | 25.00000 days | Status: CP | PRN
Start: 2020-01-14 — End: 2020-03-01

## 2020-01-14 MED ORDER — FLUTICASONE PROPIONATE 50 MCG/ACTUATION NASAL SPRAY,SUSPENSION
1 refills | 0 days | Status: CP
Start: 2020-01-14 — End: ?

## 2020-01-16 ENCOUNTER — Encounter: Admit: 2020-01-16 | Discharge: 2020-01-17 | Payer: MEDICARE | Attending: Pharmacotherapy | Primary: Pharmacotherapy

## 2020-01-16 DIAGNOSIS — I10 Essential (primary) hypertension: Principal | ICD-10-CM

## 2020-01-16 MED ORDER — LISINOPRIL 20 MG TABLET
ORAL_TABLET | Freq: Every evening | ORAL | 1 refills | 45.00000 days
Start: 2020-01-16 — End: 2020-03-26

## 2020-01-19 DIAGNOSIS — E1142 Type 2 diabetes mellitus with diabetic polyneuropathy: Principal | ICD-10-CM

## 2020-01-23 ENCOUNTER — Encounter: Admit: 2020-01-23 | Discharge: 2020-01-24 | Payer: MEDICARE | Attending: Pharmacotherapy | Primary: Pharmacotherapy

## 2020-01-23 DIAGNOSIS — I10 Essential (primary) hypertension: Principal | ICD-10-CM

## 2020-01-23 MED ORDER — AMLODIPINE 5 MG TABLET
ORAL_TABLET | Freq: Every evening | ORAL | 1 refills | 45.00000 days
Start: 2020-01-23 — End: 2020-01-30

## 2020-01-26 MED ORDER — OXYCODONE-ACETAMINOPHEN 5 MG-325 MG TABLET
ORAL_TABLET | ORAL | 0 refills | 25.00000 days | Status: CP | PRN
Start: 2020-01-26 — End: 2020-02-25

## 2020-01-30 ENCOUNTER — Encounter: Admit: 2020-01-30 | Discharge: 2020-01-31 | Payer: MEDICARE | Attending: Pharmacotherapy | Primary: Pharmacotherapy

## 2020-01-30 DIAGNOSIS — I10 Essential (primary) hypertension: Principal | ICD-10-CM

## 2020-01-30 MED ORDER — METOPROLOL SUCCINATE ER 25 MG TABLET,EXTENDED RELEASE 24 HR
ORAL_TABLET | Freq: Every evening | ORAL | 5 refills | 30.00000 days | Status: CP
Start: 2020-01-30 — End: 2021-01-29

## 2020-01-30 MED ORDER — METOPROLOL SUCCINATE ER 25 MG TABLET,EXTENDED RELEASE 24 HR: 25 mg | tablet | Freq: Every evening | 5 refills | 30 days | Status: AC

## 2020-01-30 MED ORDER — NIFEDIPINE ER 60 MG TABLET,EXTENDED RELEASE 24 HR
ORAL_TABLET | Freq: Every evening | ORAL | 5 refills | 30 days | Status: CP
Start: 2020-01-30 — End: 2021-01-29

## 2020-02-11 MED ORDER — OXYCODONE-ACETAMINOPHEN 5 MG-325 MG TABLET
ORAL_TABLET | ORAL | 0 refills | 25.00000 days | Status: CP | PRN
Start: 2020-02-11 — End: 2020-03-01

## 2020-02-13 ENCOUNTER — Encounter: Admit: 2020-02-13 | Discharge: 2020-02-14 | Payer: MEDICARE | Attending: Pharmacotherapy | Primary: Pharmacotherapy

## 2020-03-01 ENCOUNTER — Encounter: Admit: 2020-03-01 | Discharge: 2020-03-02 | Payer: MEDICARE | Attending: Internal Medicine | Primary: Internal Medicine

## 2020-03-01 DIAGNOSIS — M5090 Cervical disc disorder, unspecified, unspecified cervical region: Principal | ICD-10-CM

## 2020-03-01 DIAGNOSIS — G8929 Other chronic pain: Principal | ICD-10-CM

## 2020-03-01 DIAGNOSIS — I10 Essential (primary) hypertension: Principal | ICD-10-CM

## 2020-03-01 DIAGNOSIS — E1142 Type 2 diabetes mellitus with diabetic polyneuropathy: Principal | ICD-10-CM

## 2020-03-01 DIAGNOSIS — M545 Chronic midline low back pain without sciatica: Principal | ICD-10-CM

## 2020-03-01 MED ORDER — MEDICAL SUPPLY ITEM
0 refills | 0.00000 days | Status: CP
Start: 2020-03-01 — End: ?

## 2020-03-12 DIAGNOSIS — E1142 Type 2 diabetes mellitus with diabetic polyneuropathy: Principal | ICD-10-CM

## 2020-03-13 MED ORDER — OXYCODONE-ACETAMINOPHEN 5 MG-325 MG TABLET
ORAL_TABLET | ORAL | 0 refills | 25.00000 days | Status: CP | PRN
Start: 2020-03-13 — End: 2020-03-26

## 2020-03-14 MED ORDER — METFORMIN 500 MG TABLET
ORAL_TABLET | Freq: Two times a day (BID) | ORAL | 3 refills | 90 days | Status: CP
Start: 2020-03-14 — End: ?

## 2020-03-26 ENCOUNTER — Ambulatory Visit: Admit: 2020-03-26 | Discharge: 2020-03-27 | Payer: MEDICARE

## 2020-03-26 DIAGNOSIS — I10 Essential (primary) hypertension: Principal | ICD-10-CM

## 2020-03-26 MED ORDER — LISINOPRIL 40 MG TABLET
ORAL_TABLET | Freq: Every evening | ORAL | 3 refills | 90.00000 days | Status: CP
Start: 2020-03-26 — End: ?

## 2020-03-27 DIAGNOSIS — I10 Essential (primary) hypertension: Principal | ICD-10-CM

## 2020-03-27 DIAGNOSIS — R42 Dizziness and giddiness: Principal | ICD-10-CM

## 2020-03-27 DIAGNOSIS — H903 Sensorineural hearing loss, bilateral: Principal | ICD-10-CM

## 2020-04-10 MED ORDER — OXYCODONE-ACETAMINOPHEN 5 MG-325 MG TABLET
ORAL_TABLET | ORAL | 0 refills | 25 days | Status: CP | PRN
Start: 2020-04-10 — End: 2020-03-26

## 2020-04-23 DIAGNOSIS — H9190 Unspecified hearing loss, unspecified ear: Principal | ICD-10-CM

## 2020-04-26 ENCOUNTER — Encounter: Admit: 2020-04-26 | Discharge: 2020-04-26 | Payer: MEDICARE | Attending: Audiologist | Primary: Audiologist

## 2020-04-26 ENCOUNTER — Encounter: Admit: 2020-04-26 | Discharge: 2020-04-26 | Payer: MEDICARE | Attending: Family | Primary: Family

## 2020-04-26 DIAGNOSIS — H903 Sensorineural hearing loss, bilateral: Principal | ICD-10-CM

## 2020-04-26 DIAGNOSIS — R42 Dizziness and giddiness: Principal | ICD-10-CM

## 2020-04-26 DIAGNOSIS — H9313 Tinnitus, bilateral: Principal | ICD-10-CM

## 2020-05-03 ENCOUNTER — Encounter: Admit: 2020-05-03 | Discharge: 2020-05-04 | Payer: MEDICARE | Attending: Audiologist | Primary: Audiologist

## 2020-05-04 MED ORDER — ATORVASTATIN 40 MG TABLET
ORAL_TABLET | 3 refills | 0 days | Status: CP
Start: 2020-05-04 — End: ?

## 2020-05-08 MED ORDER — OXYCODONE-ACETAMINOPHEN 5 MG-325 MG TABLET
ORAL_TABLET | ORAL | 0 refills | 25 days | Status: CP | PRN
Start: 2020-05-08 — End: 2020-06-07

## 2020-05-20 ENCOUNTER — Ambulatory Visit: Admit: 2020-05-20 | Payer: MEDICARE

## 2020-05-21 ENCOUNTER — Encounter: Admit: 2020-05-21 | Discharge: 2020-05-22 | Payer: MEDICARE

## 2020-05-31 ENCOUNTER — Institutional Professional Consult (permissible substitution): Admit: 2020-05-31 | Discharge: 2020-05-31 | Payer: MEDICARE | Attending: Internal Medicine | Primary: Internal Medicine

## 2020-05-31 ENCOUNTER — Ambulatory Visit: Admit: 2020-05-31 | Discharge: 2020-05-31 | Payer: MEDICARE

## 2020-05-31 ENCOUNTER — Encounter: Admit: 2020-05-31 | Discharge: 2020-05-31 | Payer: MEDICARE | Attending: Internal Medicine | Primary: Internal Medicine

## 2020-05-31 DIAGNOSIS — M5090 Cervical disc disorder, unspecified, unspecified cervical region: Principal | ICD-10-CM

## 2020-05-31 DIAGNOSIS — I10 Essential (primary) hypertension: Principal | ICD-10-CM

## 2020-05-31 DIAGNOSIS — H903 Sensorineural hearing loss, bilateral: Principal | ICD-10-CM

## 2020-05-31 DIAGNOSIS — M545 Chronic midline low back pain without sciatica: Principal | ICD-10-CM

## 2020-05-31 DIAGNOSIS — N1831 Chronic kidney disease (CKD) stage G3a/A1, moderately decreased glomerular filtration rate (GFR) between 45-59 mL/min/1.73 square meter and albuminuria creatinine ratio less than 30 mg/g (CMS-HCC): Principal | ICD-10-CM

## 2020-05-31 DIAGNOSIS — G8929 Other chronic pain: Principal | ICD-10-CM

## 2020-05-31 DIAGNOSIS — R413 Other amnesia: Principal | ICD-10-CM

## 2020-05-31 DIAGNOSIS — E1142 Type 2 diabetes mellitus with diabetic polyneuropathy: Principal | ICD-10-CM

## 2020-06-01 ENCOUNTER — Encounter: Admit: 2020-06-01 | Discharge: 2020-06-02 | Payer: MEDICARE | Attending: Internal Medicine | Primary: Internal Medicine

## 2020-06-01 DIAGNOSIS — I10 Essential (primary) hypertension: Principal | ICD-10-CM

## 2020-06-02 MED ORDER — OXYCODONE-ACETAMINOPHEN 5 MG-325 MG TABLET
ORAL_TABLET | ORAL | 0 refills | 25 days | Status: CP | PRN
Start: 2020-06-02 — End: 2020-07-02

## 2020-06-23 ENCOUNTER — Ambulatory Visit: Admit: 2020-06-23 | Discharge: 2020-06-24 | Payer: MEDICARE

## 2020-06-30 ENCOUNTER — Encounter: Admit: 2020-06-30 | Discharge: 2020-07-01 | Disposition: A | Discharge status: Home / Self Care | Payer: MEDICARE

## 2020-06-30 MED ORDER — OXYCODONE-ACETAMINOPHEN 5 MG-325 MG TABLET
ORAL_TABLET | ORAL | 0 refills | 25.00000 days | Status: CP | PRN
Start: 2020-06-30 — End: 2020-07-30

## 2020-07-01 MED ORDER — NYSTATIN 100,000 UNIT/ML ORAL SUSPENSION
Freq: Four times a day (QID) | ORAL | 0 refills | 9 days | Status: CP
Start: 2020-07-01 — End: 2020-07-10

## 2020-07-12 ENCOUNTER — Encounter: Admit: 2020-07-12 | Discharge: 2020-07-13 | Payer: MEDICARE | Attending: Internal Medicine | Primary: Internal Medicine

## 2020-07-12 ENCOUNTER — Encounter: Admit: 2020-07-12 | Discharge: 2020-07-13 | Payer: MEDICARE | Attending: Pharmacist | Primary: Pharmacist

## 2020-07-12 DIAGNOSIS — Z23 Encounter for immunization: Principal | ICD-10-CM

## 2020-07-12 DIAGNOSIS — M5090 Cervical disc disorder, unspecified, unspecified cervical region: Principal | ICD-10-CM

## 2020-07-12 DIAGNOSIS — I10 Essential (primary) hypertension: Principal | ICD-10-CM

## 2020-07-12 DIAGNOSIS — G8929 Other chronic pain: Principal | ICD-10-CM

## 2020-07-12 DIAGNOSIS — Z0000 Encounter for general adult medical examination without abnormal findings: Principal | ICD-10-CM

## 2020-07-12 DIAGNOSIS — M545 Chronic midline low back pain without sciatica: Principal | ICD-10-CM

## 2020-07-12 MED ORDER — NIFEDIPINE ER 60 MG TABLET,EXTENDED RELEASE 24 HR
ORAL_TABLET | Freq: Every evening | ORAL | 1 refills | 90 days | Status: CP
Start: 2020-07-12 — End: 2021-01-08

## 2020-07-12 MED ORDER — SHINGRIX (PF) 50 MCG/0.5 ML INTRAMUSCULAR SUSPENSION, KIT
Freq: Once | INTRAMUSCULAR | 1 refills | 1.00000 days | Status: CP
Start: 2020-07-12 — End: 2020-07-12

## 2020-07-12 MED ORDER — OXYCODONE-ACETAMINOPHEN 5 MG-325 MG TABLET
ORAL_TABLET | ORAL | 0 refills | 25.00000 days | Status: CP | PRN
Start: 2020-07-12 — End: 2020-08-11

## 2020-07-15 DIAGNOSIS — J449 Chronic obstructive pulmonary disease, unspecified: Principal | ICD-10-CM

## 2020-07-15 MED ORDER — ALBUTEROL SULFATE HFA 90 MCG/ACTUATION AEROSOL INHALER
0 refills | 0.00000 days | Status: CP
Start: 2020-07-15 — End: ?

## 2020-08-06 ENCOUNTER — Ambulatory Visit: Admit: 2020-08-06 | Payer: MEDICARE

## 2020-08-09 MED ORDER — OXYCODONE-ACETAMINOPHEN 5 MG-325 MG TABLET
ORAL_TABLET | ORAL | 0 refills | 25 days | Status: CP | PRN
Start: 2020-08-09 — End: 2020-09-08

## 2020-08-18 DIAGNOSIS — K219 Gastro-esophageal reflux disease without esophagitis: Principal | ICD-10-CM

## 2020-08-19 MED ORDER — OMEPRAZOLE 20 MG CAPSULE,DELAYED RELEASE
ORAL_CAPSULE | 3 refills | 0 days | Status: CP
Start: 2020-08-19 — End: ?

## 2020-08-24 ENCOUNTER — Encounter: Admit: 2020-08-24 | Discharge: 2020-08-25 | Payer: MEDICARE | Attending: Internal Medicine | Primary: Internal Medicine

## 2020-08-24 DIAGNOSIS — G8929 Other chronic pain: Principal | ICD-10-CM

## 2020-08-24 DIAGNOSIS — M5090 Cervical disc disorder, unspecified, unspecified cervical region: Principal | ICD-10-CM

## 2020-08-24 DIAGNOSIS — M545 Chronic midline low back pain without sciatica: Principal | ICD-10-CM

## 2020-08-24 DIAGNOSIS — E1142 Type 2 diabetes mellitus with diabetic polyneuropathy: Principal | ICD-10-CM

## 2020-08-24 DIAGNOSIS — R42 Dizziness and giddiness: Principal | ICD-10-CM

## 2020-08-24 DIAGNOSIS — E119 Type 2 diabetes mellitus without complications: Principal | ICD-10-CM

## 2020-08-24 MED ORDER — GABAPENTIN 300 MG CAPSULE
ORAL_CAPSULE | Freq: Two times a day (BID) | ORAL | 1 refills | 90.00000 days | Status: CP
Start: 2020-08-24 — End: ?

## 2020-08-24 MED ORDER — OXYCODONE-ACETAMINOPHEN 5 MG-325 MG TABLET
ORAL_TABLET | ORAL | 0 refills | 25 days | Status: CP | PRN
Start: 2020-08-24 — End: 2020-09-23

## 2020-09-01 DIAGNOSIS — J449 Chronic obstructive pulmonary disease, unspecified: Principal | ICD-10-CM

## 2020-09-01 MED ORDER — ALBUTEROL SULFATE HFA 90 MCG/ACTUATION AEROSOL INHALER
5 refills | 0.00000 days | Status: CP
Start: 2020-09-01 — End: ?

## 2020-09-23 MED ORDER — OXYCODONE-ACETAMINOPHEN 5 MG-325 MG TABLET
ORAL_TABLET | ORAL | 0 refills | 25 days | Status: CP | PRN
Start: 2020-09-23 — End: 2020-10-23

## 2020-11-15 ENCOUNTER — Ambulatory Visit: Admit: 2020-11-15 | Discharge: 2020-11-15 | Disposition: A | Discharge status: Home / Self Care | Payer: MEDICARE

## 2020-11-15 DIAGNOSIS — H5789 Other specified disorders of eye and adnexa: Principal | ICD-10-CM

## 2020-11-18 ENCOUNTER — Ambulatory Visit: Admit: 2020-11-18 | Discharge: 2020-11-19 | Payer: MEDICARE

## 2020-11-18 DIAGNOSIS — H53131 Sudden visual loss, right eye: Principal | ICD-10-CM

## 2020-11-18 DIAGNOSIS — H547 Unspecified visual loss: Principal | ICD-10-CM

## 2020-11-18 DIAGNOSIS — H5789 Other specified disorders of eye and adnexa: Principal | ICD-10-CM

## 2020-11-26 ENCOUNTER — Ambulatory Visit: Admit: 2020-11-26 | Discharge: 2020-11-27 | Payer: MEDICARE | Attending: Internal Medicine | Primary: Internal Medicine

## 2020-11-26 DIAGNOSIS — N400 Enlarged prostate without lower urinary tract symptoms: Principal | ICD-10-CM

## 2020-11-26 DIAGNOSIS — I10 Essential (primary) hypertension: Principal | ICD-10-CM

## 2020-11-26 DIAGNOSIS — E1142 Type 2 diabetes mellitus with diabetic polyneuropathy: Principal | ICD-10-CM

## 2020-11-26 DIAGNOSIS — M5090 Cervical disc disorder, unspecified, unspecified cervical region: Principal | ICD-10-CM

## 2020-11-26 DIAGNOSIS — M545 Chronic midline low back pain without sciatica: Principal | ICD-10-CM

## 2020-11-26 DIAGNOSIS — G8929 Other chronic pain: Principal | ICD-10-CM

## 2020-11-26 MED ORDER — FINASTERIDE 5 MG TABLET
ORAL_TABLET | Freq: Every day | ORAL | 3 refills | 90 days | Status: CP
Start: 2020-11-26 — End: ?

## 2020-11-26 MED ORDER — OXYCODONE-ACETAMINOPHEN 5 MG-325 MG TABLET
ORAL_TABLET | ORAL | 0 refills | 25 days | Status: CP | PRN
Start: 2020-11-26 — End: ?

## 2020-11-26 MED ORDER — TAMSULOSIN 0.4 MG CAPSULE
ORAL_CAPSULE | Freq: Every day | ORAL | 0 refills | 90.00000 days | Status: CP
Start: 2020-11-26 — End: ?

## 2020-12-24 MED ORDER — OXYCODONE-ACETAMINOPHEN 5 MG-325 MG TABLET
ORAL_TABLET | ORAL | 0 refills | 25 days | Status: CP | PRN
Start: 2020-12-24 — End: 2021-01-23

## 2020-12-28 DIAGNOSIS — I10 Essential (primary) hypertension: Principal | ICD-10-CM

## 2020-12-28 MED ORDER — NIFEDIPINE ER 60 MG TABLET,EXTENDED RELEASE 24 HR
ORAL_TABLET | 2 refills | 0 days | Status: CP
Start: 2020-12-28 — End: ?

## 2021-01-21 MED ORDER — OXYCODONE-ACETAMINOPHEN 5 MG-325 MG TABLET
ORAL_TABLET | ORAL | 0 refills | 25.00000 days | Status: CP | PRN
Start: 2021-01-21 — End: 2021-02-20

## 2021-01-31 MED ORDER — SPIRIVA WITH HANDIHALER 18 MCG AND INHALATION CAPSULES
ORAL_CAPSULE | Freq: Every day | RESPIRATORY_TRACT | 3 refills | 90 days | Status: CP
Start: 2021-01-31 — End: ?

## 2021-02-25 ENCOUNTER — Ambulatory Visit: Admit: 2021-02-25 | Discharge: 2021-02-26 | Payer: MEDICARE | Attending: Internal Medicine | Primary: Internal Medicine

## 2021-02-25 DIAGNOSIS — M5090 Cervical disc disorder, unspecified, unspecified cervical region: Principal | ICD-10-CM

## 2021-02-25 DIAGNOSIS — D126 Benign neoplasm of colon, unspecified: Principal | ICD-10-CM

## 2021-02-25 DIAGNOSIS — I10 Essential (primary) hypertension: Principal | ICD-10-CM

## 2021-02-25 DIAGNOSIS — B354 Tinea corporis: Principal | ICD-10-CM

## 2021-02-25 DIAGNOSIS — L853 Xerosis cutis: Principal | ICD-10-CM

## 2021-02-25 DIAGNOSIS — M545 Chronic midline low back pain without sciatica: Principal | ICD-10-CM

## 2021-02-25 DIAGNOSIS — E1142 Type 2 diabetes mellitus with diabetic polyneuropathy: Principal | ICD-10-CM

## 2021-02-25 DIAGNOSIS — N400 Enlarged prostate without lower urinary tract symptoms: Principal | ICD-10-CM

## 2021-02-25 DIAGNOSIS — G8929 Other chronic pain: Principal | ICD-10-CM

## 2021-02-25 MED ORDER — OXYCODONE-ACETAMINOPHEN 5 MG-325 MG TABLET
ORAL_TABLET | ORAL | 0 refills | 25 days | Status: CP | PRN
Start: 2021-02-25 — End: 2021-03-27

## 2021-02-25 MED ORDER — TAMSULOSIN 0.4 MG CAPSULE
ORAL_CAPSULE | Freq: Every day | ORAL | 3 refills | 90 days | Status: CP
Start: 2021-02-25 — End: ?

## 2021-02-25 MED ORDER — ECONAZOLE 1 % TOPICAL CREAM
0 refills | 0 days | Status: CP
Start: 2021-02-25 — End: ?

## 2021-02-25 MED ORDER — LISINOPRIL 40 MG TABLET
ORAL_TABLET | Freq: Every evening | ORAL | 3 refills | 90 days | Status: CP
Start: 2021-02-25 — End: ?

## 2021-03-12 DIAGNOSIS — E1142 Type 2 diabetes mellitus with diabetic polyneuropathy: Principal | ICD-10-CM

## 2021-03-15 MED ORDER — METFORMIN 500 MG TABLET
ORAL_TABLET | 3 refills | 0.00000 days | Status: CP
Start: 2021-03-15 — End: ?

## 2021-03-25 MED ORDER — OXYCODONE-ACETAMINOPHEN 5 MG-325 MG TABLET
ORAL_TABLET | ORAL | 0 refills | 25 days | Status: CP | PRN
Start: 2021-03-25 — End: ?

## 2021-04-22 MED ORDER — OXYCODONE-ACETAMINOPHEN 5 MG-325 MG TABLET
ORAL_TABLET | ORAL | 0 refills | 25 days | Status: CP | PRN
Start: 2021-04-22 — End: 2021-05-22

## 2021-04-25 MED ORDER — ATORVASTATIN 40 MG TABLET
ORAL_TABLET | 3 refills | 0 days | Status: CP
Start: 2021-04-25 — End: ?

## 2021-05-12 MED ORDER — METFORMIN 500 MG TABLET
ORAL_TABLET | Freq: Every day | ORAL | 3 refills | 90 days | Status: CP
Start: 2021-05-12 — End: ?

## 2021-05-23 ENCOUNTER — Ambulatory Visit: Admit: 2021-05-23 | Discharge: 2021-05-24 | Payer: MEDICARE | Attending: Internal Medicine | Primary: Internal Medicine

## 2021-05-23 DIAGNOSIS — I10 Essential (primary) hypertension: Principal | ICD-10-CM

## 2021-05-23 DIAGNOSIS — G8929 Other chronic pain: Principal | ICD-10-CM

## 2021-05-23 DIAGNOSIS — M5090 Cervical disc disorder, unspecified, unspecified cervical region: Principal | ICD-10-CM

## 2021-05-23 DIAGNOSIS — E1142 Type 2 diabetes mellitus with diabetic polyneuropathy: Principal | ICD-10-CM

## 2021-05-23 DIAGNOSIS — R6889 Other general symptoms and signs: Principal | ICD-10-CM

## 2021-05-23 DIAGNOSIS — M545 Chronic midline low back pain without sciatica: Principal | ICD-10-CM

## 2021-05-23 MED ORDER — OXYCODONE-ACETAMINOPHEN 5 MG-325 MG TABLET
ORAL_TABLET | ORAL | 0 refills | 25 days | Status: CP | PRN
Start: 2021-05-23 — End: 2021-06-22

## 2021-06-23 MED ORDER — OXYCODONE-ACETAMINOPHEN 5 MG-325 MG TABLET
ORAL_TABLET | ORAL | 0 refills | 25 days | Status: CP | PRN
Start: 2021-06-23 — End: ?

## 2021-07-22 MED ORDER — OXYCODONE-ACETAMINOPHEN 5 MG-325 MG TABLET
ORAL_TABLET | ORAL | 0 refills | 25 days | Status: CP | PRN
Start: 2021-07-22 — End: 2021-08-21

## 2021-07-26 MED ORDER — FLUTICASONE PROPIONATE 50 MCG/ACTUATION NASAL SPRAY,SUSPENSION
7 refills | 0 days | Status: CP
Start: 2021-07-26 — End: ?

## 2021-08-26 ENCOUNTER — Ambulatory Visit: Admit: 2021-08-26 | Discharge: 2021-08-26 | Payer: MEDICARE

## 2021-08-26 ENCOUNTER — Ambulatory Visit: Admit: 2021-08-26 | Discharge: 2021-08-26 | Payer: MEDICARE | Attending: Internal Medicine | Primary: Internal Medicine

## 2021-08-26 DIAGNOSIS — J309 Allergic rhinitis, unspecified: Principal | ICD-10-CM

## 2021-08-26 DIAGNOSIS — R053 Chronic cough: Principal | ICD-10-CM

## 2021-08-26 DIAGNOSIS — R0602 Shortness of breath: Principal | ICD-10-CM

## 2021-08-26 DIAGNOSIS — R1310 Dysphagia, unspecified: Principal | ICD-10-CM

## 2021-08-26 DIAGNOSIS — M5090 Cervical disc disorder, unspecified, unspecified cervical region: Principal | ICD-10-CM

## 2021-08-26 DIAGNOSIS — E1142 Type 2 diabetes mellitus with diabetic polyneuropathy: Principal | ICD-10-CM

## 2021-08-26 DIAGNOSIS — M545 Chronic midline low back pain without sciatica: Principal | ICD-10-CM

## 2021-08-26 DIAGNOSIS — I10 Essential (primary) hypertension: Principal | ICD-10-CM

## 2021-08-26 DIAGNOSIS — G8929 Other chronic pain: Principal | ICD-10-CM

## 2021-08-26 DIAGNOSIS — J449 Chronic obstructive pulmonary disease, unspecified: Principal | ICD-10-CM

## 2021-08-26 DIAGNOSIS — Z720 Tobacco use: Principal | ICD-10-CM

## 2021-08-26 MED ORDER — METFORMIN 500 MG TABLET
ORAL_TABLET | Freq: Two times a day (BID) | ORAL | 3 refills | 90 days | Status: CP
Start: 2021-08-26 — End: ?

## 2021-08-26 MED ORDER — OXYCODONE-ACETAMINOPHEN 5 MG-325 MG TABLET
ORAL_TABLET | ORAL | 0 refills | 25 days | Status: CP | PRN
Start: 2021-08-26 — End: 2021-09-25

## 2021-09-14 DIAGNOSIS — I10 Essential (primary) hypertension: Principal | ICD-10-CM

## 2021-09-14 MED ORDER — NIFEDIPINE ER 60 MG TABLET,EXTENDED RELEASE 24 HR
ORAL_TABLET | 3 refills | 0 days | Status: CP
Start: 2021-09-14 — End: ?

## 2021-09-26 MED ORDER — OXYCODONE-ACETAMINOPHEN 5 MG-325 MG TABLET
ORAL_TABLET | ORAL | 0 refills | 25 days | Status: CP | PRN
Start: 2021-09-26 — End: ?

## 2021-09-30 ENCOUNTER — Ambulatory Visit: Admit: 2021-09-30 | Discharge: 2021-10-01 | Payer: MEDICARE | Attending: Internal Medicine | Primary: Internal Medicine

## 2021-09-30 DIAGNOSIS — G8929 Other chronic pain: Principal | ICD-10-CM

## 2021-09-30 DIAGNOSIS — J449 Chronic obstructive pulmonary disease, unspecified: Principal | ICD-10-CM

## 2021-09-30 DIAGNOSIS — N400 Enlarged prostate without lower urinary tract symptoms: Principal | ICD-10-CM

## 2021-09-30 DIAGNOSIS — R590 Localized enlarged lymph nodes: Principal | ICD-10-CM

## 2021-09-30 DIAGNOSIS — M545 Chronic midline low back pain without sciatica: Principal | ICD-10-CM

## 2021-09-30 DIAGNOSIS — R634 Abnormal weight loss: Principal | ICD-10-CM

## 2021-09-30 DIAGNOSIS — M5090 Cervical disc disorder, unspecified, unspecified cervical region: Principal | ICD-10-CM

## 2021-09-30 DIAGNOSIS — R5383 Other fatigue: Principal | ICD-10-CM

## 2021-10-12 ENCOUNTER — Ambulatory Visit: Admit: 2021-10-12 | Discharge: 2021-10-13 | Payer: MEDICARE

## 2021-10-13 DIAGNOSIS — R590 Localized enlarged lymph nodes: Principal | ICD-10-CM

## 2021-10-13 DIAGNOSIS — R634 Abnormal weight loss: Principal | ICD-10-CM

## 2021-10-13 DIAGNOSIS — R918 Other nonspecific abnormal finding of lung field: Principal | ICD-10-CM

## 2021-10-13 DIAGNOSIS — Z85118 Personal history of other malignant neoplasm of bronchus and lung: Principal | ICD-10-CM

## 2021-10-24 ENCOUNTER — Ambulatory Visit: Admit: 2021-10-24 | Discharge: 2021-10-25 | Payer: MEDICARE

## 2021-10-24 ENCOUNTER — Ambulatory Visit: Admit: 2021-10-24 | Discharge: 2021-10-25 | Payer: MEDICARE | Attending: Internal Medicine | Primary: Internal Medicine

## 2021-10-24 DIAGNOSIS — R0602 Shortness of breath: Principal | ICD-10-CM

## 2021-10-24 DIAGNOSIS — R9389 Abnormal findings on diagnostic imaging of other specified body structures: Principal | ICD-10-CM

## 2021-10-24 DIAGNOSIS — J449 Chronic obstructive pulmonary disease, unspecified: Principal | ICD-10-CM

## 2021-10-24 DIAGNOSIS — K5900 Constipation, unspecified: Principal | ICD-10-CM

## 2021-10-24 DIAGNOSIS — M25511 Pain in right shoulder: Principal | ICD-10-CM

## 2021-10-24 DIAGNOSIS — L309 Dermatitis, unspecified: Principal | ICD-10-CM

## 2021-10-24 DIAGNOSIS — G8929 Other chronic pain: Principal | ICD-10-CM

## 2021-10-24 DIAGNOSIS — R634 Abnormal weight loss: Principal | ICD-10-CM

## 2021-10-24 MED ORDER — TRIAMCINOLONE ACETONIDE 0.1 % TOPICAL CREAM
Freq: Two times a day (BID) | TOPICAL | 0 refills | 0 days | Status: CP
Start: 2021-10-24 — End: ?

## 2021-10-24 MED ORDER — OXYCODONE-ACETAMINOPHEN 5 MG-325 MG TABLET
ORAL_TABLET | ORAL | 0 refills | 25 days | Status: CP | PRN
Start: 2021-10-24 — End: 2021-11-23

## 2021-11-01 ENCOUNTER — Ambulatory Visit: Admit: 2021-11-01 | Discharge: 2021-11-02 | Payer: MEDICARE

## 2021-11-01 DIAGNOSIS — R918 Other nonspecific abnormal finding of lung field: Principal | ICD-10-CM

## 2021-11-01 DIAGNOSIS — R634 Abnormal weight loss: Principal | ICD-10-CM

## 2021-11-01 DIAGNOSIS — J449 Chronic obstructive pulmonary disease, unspecified: Principal | ICD-10-CM

## 2021-11-01 DIAGNOSIS — R0602 Shortness of breath: Principal | ICD-10-CM

## 2021-11-01 DIAGNOSIS — Z85118 Personal history of other malignant neoplasm of bronchus and lung: Principal | ICD-10-CM

## 2021-11-01 MED ORDER — TRELEGY ELLIPTA 200 MCG-62.5 MCG-25 MCG POWDER FOR INHALATION
Freq: Every day | RESPIRATORY_TRACT | 5 refills | 0 days | Status: CP
Start: 2021-11-01 — End: ?

## 2021-11-09 ENCOUNTER — Ambulatory Visit: Admit: 2021-11-09 | Discharge: 2021-11-09 | Payer: MEDICARE

## 2021-11-09 DIAGNOSIS — J339 Nasal polyp, unspecified: Principal | ICD-10-CM

## 2021-11-09 DIAGNOSIS — Z720 Tobacco use: Principal | ICD-10-CM

## 2021-11-09 DIAGNOSIS — R590 Localized enlarged lymph nodes: Principal | ICD-10-CM

## 2021-11-09 DIAGNOSIS — R1310 Dysphagia, unspecified: Principal | ICD-10-CM

## 2021-11-09 DIAGNOSIS — R634 Abnormal weight loss: Principal | ICD-10-CM

## 2021-11-09 DIAGNOSIS — R490 Dysphonia: Principal | ICD-10-CM

## 2021-11-10 ENCOUNTER — Ambulatory Visit: Admit: 2021-11-10 | Discharge: 2021-11-11 | Payer: MEDICARE

## 2021-11-13 DIAGNOSIS — N400 Enlarged prostate without lower urinary tract symptoms: Principal | ICD-10-CM

## 2021-11-15 ENCOUNTER — Ambulatory Visit: Admit: 2021-11-15 | Discharge: 2021-11-16 | Payer: MEDICARE

## 2021-11-17 MED ORDER — FINASTERIDE 5 MG TABLET
ORAL_TABLET | Freq: Every day | ORAL | 3 refills | 90 days | Status: CP
Start: 2021-11-17 — End: ?

## 2021-11-21 ENCOUNTER — Encounter: Admit: 2021-11-21 | Discharge: 2021-11-21 | Payer: MEDICARE

## 2021-11-21 ENCOUNTER — Ambulatory Visit: Admit: 2021-11-21 | Discharge: 2021-11-21 | Payer: MEDICARE

## 2021-11-21 MED ORDER — OXYCODONE-ACETAMINOPHEN 5 MG-325 MG TABLET
ORAL_TABLET | ORAL | 0 refills | 25 days | Status: CP | PRN
Start: 2021-11-21 — End: ?

## 2021-11-27 DIAGNOSIS — K219 Gastro-esophageal reflux disease without esophagitis: Principal | ICD-10-CM

## 2021-11-28 MED ORDER — OMEPRAZOLE 20 MG CAPSULE,DELAYED RELEASE
ORAL_CAPSULE | 3 refills | 0 days | Status: CP
Start: 2021-11-28 — End: ?

## 2021-12-08 DIAGNOSIS — R918 Other nonspecific abnormal finding of lung field: Principal | ICD-10-CM

## 2021-12-12 ENCOUNTER — Ambulatory Visit: Admit: 2021-12-12 | Discharge: 2021-12-13 | Payer: MEDICARE | Attending: Internal Medicine | Primary: Internal Medicine

## 2021-12-12 ENCOUNTER — Ambulatory Visit
Admit: 2021-12-12 | Discharge: 2021-12-31 | Payer: MEDICARE | Attending: Radiation Oncology | Primary: Radiation Oncology

## 2021-12-12 DIAGNOSIS — E1142 Type 2 diabetes mellitus with diabetic polyneuropathy: Principal | ICD-10-CM

## 2021-12-12 DIAGNOSIS — M545 Chronic midline low back pain without sciatica: Principal | ICD-10-CM

## 2021-12-12 DIAGNOSIS — M5090 Cervical disc disorder, unspecified, unspecified cervical region: Principal | ICD-10-CM

## 2021-12-12 DIAGNOSIS — R9389 Abnormal findings on diagnostic imaging of other specified body structures: Principal | ICD-10-CM

## 2021-12-12 DIAGNOSIS — I10 Essential (primary) hypertension: Principal | ICD-10-CM

## 2021-12-12 DIAGNOSIS — G8929 Other chronic pain: Principal | ICD-10-CM

## 2021-12-15 ENCOUNTER — Ambulatory Visit
Admit: 2021-12-12 | Discharge: 2021-12-16 | Payer: MEDICARE | Attending: Radiation Oncology | Primary: Radiation Oncology

## 2021-12-15 ENCOUNTER — Ambulatory Visit: Admit: 2021-12-22 | Discharge: 2021-12-22 | Payer: MEDICARE

## 2021-12-15 ENCOUNTER — Ambulatory Visit: Admit: 2021-12-12 | Payer: MEDICARE

## 2021-12-15 ENCOUNTER — Ambulatory Visit
Admit: 2021-12-15 | Discharge: 2021-12-23 | Payer: MEDICARE | Attending: Radiation Oncology | Primary: Radiation Oncology

## 2021-12-23 MED ORDER — OXYCODONE-ACETAMINOPHEN 5 MG-325 MG TABLET
ORAL_TABLET | ORAL | 0 refills | 25 days | Status: CP | PRN
Start: 2021-12-23 — End: 2022-01-22

## 2022-01-11 ENCOUNTER — Ambulatory Visit
Admit: 2022-01-11 | Discharge: 2022-01-24 | Payer: MEDICAID | Attending: Radiation Oncology | Primary: Radiation Oncology

## 2022-01-11 ENCOUNTER — Ambulatory Visit: Admit: 2022-01-11 | Payer: MEDICAID | Attending: Radiation Oncology | Primary: Radiation Oncology

## 2022-01-11 ENCOUNTER — Ambulatory Visit
Admit: 2022-01-11 | Discharge: 2022-01-21 | Payer: MEDICAID | Attending: Radiation Oncology | Primary: Radiation Oncology

## 2022-01-11 ENCOUNTER — Ambulatory Visit: Admit: 2022-01-11 | Discharge: 2022-01-19 | Payer: MEDICAID | Attending: Internal Medicine | Primary: Internal Medicine

## 2022-01-11 ENCOUNTER — Ambulatory Visit
Admit: 2022-01-11 | Discharge: 2022-01-14 | Payer: MEDICARE | Attending: Radiation Oncology | Primary: Radiation Oncology

## 2022-01-11 ENCOUNTER — Ambulatory Visit: Admit: 2022-01-11 | Discharge: 2022-01-12 | Payer: MEDICARE

## 2022-01-11 ENCOUNTER — Ambulatory Visit
Admit: 2022-01-11 | Discharge: 2022-01-17 | Payer: MEDICARE | Attending: Radiation Oncology | Primary: Radiation Oncology

## 2022-01-23 MED ORDER — OXYCODONE-ACETAMINOPHEN 5 MG-325 MG TABLET
ORAL_TABLET | ORAL | 0 refills | 25 days | Status: CP | PRN
Start: 2022-01-23 — End: 2022-02-22

## 2022-02-18 DIAGNOSIS — N400 Enlarged prostate without lower urinary tract symptoms: Principal | ICD-10-CM

## 2022-02-18 DIAGNOSIS — I10 Essential (primary) hypertension: Principal | ICD-10-CM

## 2022-02-21 MED ORDER — LISINOPRIL 40 MG TABLET
ORAL_TABLET | Freq: Every evening | 3 refills | 90 days | Status: CP
Start: 2022-02-21 — End: ?

## 2022-02-21 MED ORDER — TAMSULOSIN 0.4 MG CAPSULE
ORAL_CAPSULE | Freq: Every day | ORAL | 3 refills | 90 days | Status: CP
Start: 2022-02-21 — End: ?

## 2022-02-23 MED ORDER — OXYCODONE-ACETAMINOPHEN 5 MG-325 MG TABLET
ORAL_TABLET | ORAL | 0 refills | 25 days | Status: CP | PRN
Start: 2022-02-23 — End: ?

## 2022-03-02 DIAGNOSIS — L309 Dermatitis, unspecified: Principal | ICD-10-CM

## 2022-03-03 MED ORDER — TRIAMCINOLONE ACETONIDE 0.1 % TOPICAL CREAM
0 refills | 0 days | Status: CP
Start: 2022-03-03 — End: ?

## 2022-03-17 DIAGNOSIS — M545 Chronic midline low back pain without sciatica: Principal | ICD-10-CM

## 2022-03-17 DIAGNOSIS — G8929 Other chronic pain: Principal | ICD-10-CM

## 2022-03-17 DIAGNOSIS — M5090 Cervical disc disorder, unspecified, unspecified cervical region: Principal | ICD-10-CM

## 2022-03-17 MED ORDER — OXYCODONE-ACETAMINOPHEN 5 MG-325 MG TABLET
ORAL_TABLET | ORAL | 0 refills | 25 days | Status: CP | PRN
Start: 2022-03-17 — End: ?

## 2022-03-26 MED ORDER — OXYCODONE-ACETAMINOPHEN 5 MG-325 MG TABLET
ORAL_TABLET | ORAL | 0 refills | 25 days | Status: CP | PRN
Start: 2022-03-26 — End: ?

## 2022-04-17 DIAGNOSIS — G8929 Other chronic pain: Principal | ICD-10-CM

## 2022-04-17 DIAGNOSIS — M5090 Cervical disc disorder, unspecified, unspecified cervical region: Principal | ICD-10-CM

## 2022-04-17 DIAGNOSIS — M545 Chronic midline low back pain without sciatica: Principal | ICD-10-CM

## 2022-04-17 MED ORDER — OXYCODONE-ACETAMINOPHEN 5 MG-325 MG TABLET
ORAL_TABLET | ORAL | 0 refills | 25 days | Status: CP | PRN
Start: 2022-04-17 — End: ?

## 2022-04-19 ENCOUNTER — Ambulatory Visit: Admit: 2022-04-19 | Discharge: 2022-04-20 | Payer: MEDICAID

## 2022-04-19 DIAGNOSIS — J441 Chronic obstructive pulmonary disease with (acute) exacerbation: Principal | ICD-10-CM

## 2022-04-19 DIAGNOSIS — J189 Pneumonia, unspecified organism: Principal | ICD-10-CM

## 2022-04-19 DIAGNOSIS — R6889 Other general symptoms and signs: Principal | ICD-10-CM

## 2022-04-19 MED ORDER — AMOXICILLIN 875 MG-POTASSIUM CLAVULANATE 125 MG TABLET
ORAL_TABLET | Freq: Two times a day (BID) | ORAL | 0 refills | 5 days | Status: CP
Start: 2022-04-19 — End: 2022-04-24

## 2022-04-19 MED ORDER — AZITHROMYCIN 500 MG TABLET
ORAL_TABLET | Freq: Every day | ORAL | 0 refills | 3 days | Status: CP
Start: 2022-04-19 — End: 2022-04-22

## 2022-04-19 MED ORDER — PREDNISONE 20 MG TABLET
ORAL_TABLET | Freq: Every day | ORAL | 0 refills | 5 days | Status: CP
Start: 2022-04-19 — End: ?

## 2022-04-19 MED ORDER — ATORVASTATIN 40 MG TABLET
ORAL_TABLET | 3 refills | 0 days | Status: CP
Start: 2022-04-19 — End: ?

## 2022-04-25 ENCOUNTER — Ambulatory Visit
Admit: 2022-04-25 | Discharge: 2022-04-26 | Payer: MEDICAID | Attending: Radiation Oncology | Primary: Radiation Oncology

## 2022-04-25 ENCOUNTER — Ambulatory Visit: Admit: 2022-04-25 | Discharge: 2022-04-25 | Payer: MEDICAID

## 2022-04-25 MED ORDER — PREDNISONE 10 MG TABLET
ORAL_TABLET | ORAL | 0 refills | 28 days | Status: CP
Start: 2022-04-25 — End: 2022-05-23

## 2022-04-25 MED ORDER — CLOTRIMAZOLE 1 % TOPICAL CREAM
Freq: Two times a day (BID) | TOPICAL | 1 refills | 0 days | Status: CP
Start: 2022-04-25 — End: 2023-04-25

## 2022-04-27 DIAGNOSIS — E1142 Type 2 diabetes mellitus with diabetic polyneuropathy: Principal | ICD-10-CM

## 2022-04-27 MED ORDER — LANCETS
11 refills | 0 days | Status: CP
Start: 2022-04-27 — End: 2023-04-27

## 2022-04-27 MED ORDER — BLOOD-GLUCOSE METER KIT WRAPPER
11 refills | 0 days | Status: CP
Start: 2022-04-27 — End: 2023-04-27

## 2022-04-27 MED ORDER — BLOOD GLUCOSE TEST STRIPS
ORAL_STRIP | 11 refills | 0 days | Status: CP
Start: 2022-04-27 — End: 2023-04-27

## 2022-05-08 ENCOUNTER — Ambulatory Visit: Admit: 2022-05-08 | Discharge: 2022-05-09 | Payer: MEDICAID | Attending: Internal Medicine | Primary: Internal Medicine

## 2022-05-08 DIAGNOSIS — M545 Chronic midline low back pain without sciatica: Principal | ICD-10-CM

## 2022-05-08 DIAGNOSIS — E1142 Type 2 diabetes mellitus with diabetic polyneuropathy: Principal | ICD-10-CM

## 2022-05-08 DIAGNOSIS — G8929 Other chronic pain: Principal | ICD-10-CM

## 2022-05-08 DIAGNOSIS — I10 Essential (primary) hypertension: Principal | ICD-10-CM

## 2022-05-08 DIAGNOSIS — M5090 Cervical disc disorder, unspecified, unspecified cervical region: Principal | ICD-10-CM

## 2022-05-08 MED ORDER — NIFEDIPINE ER 90 MG TABLET,EXTENDED RELEASE 24 HR
ORAL_TABLET | Freq: Every day | ORAL | 3 refills | 90 days | Status: CP
Start: 2022-05-08 — End: 2023-05-08

## 2022-05-19 MED ORDER — TRELEGY ELLIPTA 200 MCG-62.5 MCG-25 MCG POWDER FOR INHALATION
Freq: Every day | RESPIRATORY_TRACT | 5 refills | 0 days | Status: CP
Start: 2022-05-19 — End: ?

## 2022-05-19 MED ORDER — OXYCODONE-ACETAMINOPHEN 5 MG-325 MG TABLET
ORAL_TABLET | ORAL | 0 refills | 25 days | Status: CP | PRN
Start: 2022-05-19 — End: ?

## 2022-06-09 ENCOUNTER — Ambulatory Visit: Admit: 2022-06-09 | Discharge: 2022-06-10 | Payer: MEDICAID

## 2022-06-09 ENCOUNTER — Ambulatory Visit
Admit: 2022-06-09 | Discharge: 2022-06-10 | Payer: MEDICAID | Attending: Student in an Organized Health Care Education/Training Program | Primary: Student in an Organized Health Care Education/Training Program

## 2022-06-09 DIAGNOSIS — N183 Stage 3 chronic kidney disease, unspecified whether stage 3a or 3b CKD (CMS-HCC): Principal | ICD-10-CM

## 2022-06-09 DIAGNOSIS — R600 Localized edema: Principal | ICD-10-CM

## 2022-06-09 DIAGNOSIS — E861 Hypovolemia: Principal | ICD-10-CM

## 2022-06-09 DIAGNOSIS — K529 Noninfective gastroenteritis and colitis, unspecified: Principal | ICD-10-CM

## 2022-06-09 DIAGNOSIS — D649 Anemia, unspecified: Principal | ICD-10-CM

## 2022-06-09 DIAGNOSIS — R7989 Other specified abnormal findings of blood chemistry: Principal | ICD-10-CM

## 2022-06-09 DIAGNOSIS — R0600 Dyspnea, unspecified: Principal | ICD-10-CM

## 2022-06-09 DIAGNOSIS — R000 Tachycardia, unspecified: Principal | ICD-10-CM

## 2022-06-09 DIAGNOSIS — J449 Chronic obstructive pulmonary disease, unspecified: Principal | ICD-10-CM

## 2022-06-09 DIAGNOSIS — R058 Productive cough: Principal | ICD-10-CM

## 2022-06-09 MED ORDER — ALBUTEROL SULFATE 2.5 MG/3 ML (0.083 %) SOLUTION FOR NEBULIZATION
RESPIRATORY_TRACT | 2 refills | 4 days | Status: CP | PRN
Start: 2022-06-09 — End: 2023-06-09

## 2022-06-16 ENCOUNTER — Ambulatory Visit: Admit: 2022-06-16 | Discharge: 2022-06-17 | Payer: MEDICAID

## 2022-06-16 MED ORDER — OXYCODONE-ACETAMINOPHEN 5 MG-325 MG TABLET
ORAL_TABLET | ORAL | 0 refills | 2 days | Status: CP | PRN
Start: 2022-06-16 — End: ?

## 2022-06-22 DIAGNOSIS — G8929 Other chronic pain: Principal | ICD-10-CM

## 2022-06-22 DIAGNOSIS — M5090 Cervical disc disorder, unspecified, unspecified cervical region: Principal | ICD-10-CM

## 2022-06-22 DIAGNOSIS — M545 Chronic midline low back pain without sciatica: Principal | ICD-10-CM

## 2022-06-22 MED ORDER — OXYCODONE-ACETAMINOPHEN 5 MG-325 MG TABLET
ORAL_TABLET | ORAL | 0 refills | 25 days | Status: CP | PRN
Start: 2022-06-22 — End: ?

## 2022-06-25 ENCOUNTER — Encounter: Admit: 2022-06-25 | Discharge: 2022-06-29 | Disposition: A | Discharge status: Home Health | Payer: MEDICARE

## 2022-06-25 ENCOUNTER — Ambulatory Visit: Admit: 2022-06-25 | Discharge: 2022-06-29 | Disposition: A | Discharge status: Home Health | Payer: MEDICARE

## 2022-06-29 MED ORDER — CEFDINIR 300 MG CAPSULE
ORAL_CAPSULE | Freq: Two times a day (BID) | ORAL | 0 refills | 1 days | Status: CP
Start: 2022-06-29 — End: 2022-06-29

## 2022-06-29 MED ORDER — MAGNESIUM OXIDE 400 MG (241.3 MG MAGNESIUM) TABLET
ORAL_TABLET | Freq: Two times a day (BID) | ORAL | 1 refills | 30 days | Status: CP
Start: 2022-06-29 — End: 2022-08-28

## 2022-06-29 MED ORDER — DOXYCYCLINE HYCLATE 100 MG TABLET
ORAL_TABLET | Freq: Two times a day (BID) | ORAL | 0 refills | 1 days | Status: CP
Start: 2022-06-29 — End: ?
  Filled 2022-06-29: qty 2, 1d supply, fill #0

## 2022-07-12 ENCOUNTER — Ambulatory Visit: Admit: 2022-07-12 | Discharge: 2022-07-13 | Payer: MEDICAID | Attending: Internal Medicine | Primary: Internal Medicine

## 2022-07-12 DIAGNOSIS — J700 Acute pulmonary manifestations due to radiation: Principal | ICD-10-CM

## 2022-07-12 DIAGNOSIS — E876 Hypokalemia: Principal | ICD-10-CM

## 2022-07-12 DIAGNOSIS — J449 Chronic obstructive pulmonary disease, unspecified: Principal | ICD-10-CM

## 2022-07-12 DIAGNOSIS — E119 Type 2 diabetes mellitus without complications: Principal | ICD-10-CM

## 2022-07-12 DIAGNOSIS — J690 Pneumonitis due to inhalation of food and vomit: Principal | ICD-10-CM

## 2022-07-12 DIAGNOSIS — J90 Pleural effusion, not elsewhere classified: Principal | ICD-10-CM

## 2022-07-12 DIAGNOSIS — E785 Hyperlipidemia, unspecified: Principal | ICD-10-CM

## 2022-07-12 DIAGNOSIS — R634 Abnormal weight loss: Principal | ICD-10-CM

## 2022-07-12 DIAGNOSIS — R42 Dizziness and giddiness: Principal | ICD-10-CM

## 2022-07-12 DIAGNOSIS — N1831 Stage 3a chronic kidney disease (CMS-HCC): Principal | ICD-10-CM

## 2022-07-12 DIAGNOSIS — I10 Essential (primary) hypertension: Principal | ICD-10-CM

## 2022-07-12 DIAGNOSIS — Z09 Encounter for follow-up examination after completed treatment for conditions other than malignant neoplasm: Principal | ICD-10-CM

## 2022-07-12 DIAGNOSIS — R0902 Hypoxemia: Principal | ICD-10-CM

## 2022-07-14 MED ORDER — OXYCODONE-ACETAMINOPHEN 5 MG-325 MG TABLET
ORAL_TABLET | ORAL | 0 refills | 25 days | Status: CP | PRN
Start: 2022-07-14 — End: 2022-08-13

## 2022-07-17 ENCOUNTER — Ambulatory Visit: Admit: 2022-07-17 | Discharge: 2022-07-17 | Payer: MEDICAID | Attending: Internal Medicine | Primary: Internal Medicine

## 2022-07-17 ENCOUNTER — Ambulatory Visit: Admit: 2022-07-17 | Discharge: 2022-07-17 | Payer: MEDICAID

## 2022-07-17 DIAGNOSIS — N179 Acute kidney failure, unspecified: Principal | ICD-10-CM

## 2022-07-17 DIAGNOSIS — I10 Essential (primary) hypertension: Principal | ICD-10-CM

## 2022-07-17 DIAGNOSIS — N1831 Stage 3a chronic kidney disease (CMS-HCC): Principal | ICD-10-CM

## 2022-07-17 DIAGNOSIS — E119 Type 2 diabetes mellitus without complications: Principal | ICD-10-CM

## 2022-07-17 DIAGNOSIS — J690 Pneumonitis due to inhalation of food and vomit: Principal | ICD-10-CM

## 2022-07-17 DIAGNOSIS — M5090 Cervical disc disorder, unspecified, unspecified cervical region: Principal | ICD-10-CM

## 2022-07-17 DIAGNOSIS — G8929 Other chronic pain: Principal | ICD-10-CM

## 2022-07-17 DIAGNOSIS — M545 Chronic midline low back pain without sciatica: Principal | ICD-10-CM

## 2022-07-25 ENCOUNTER — Ambulatory Visit
Admit: 2022-07-25 | Discharge: 2022-07-26 | Payer: MEDICAID | Attending: Radiation Oncology | Primary: Radiation Oncology

## 2022-07-25 ENCOUNTER — Ambulatory Visit: Admit: 2022-07-25 | Discharge: 2022-07-25 | Payer: MEDICAID

## 2022-07-31 DIAGNOSIS — J449 Chronic obstructive pulmonary disease, unspecified: Principal | ICD-10-CM

## 2022-08-08 ENCOUNTER — Ambulatory Visit: Admit: 2022-08-08 | Discharge: 2022-08-09 | Payer: MEDICARE

## 2022-08-10 ENCOUNTER — Ambulatory Visit: Admit: 2022-08-10 | Discharge: 2022-08-11 | Payer: MEDICARE

## 2022-08-10 MED ORDER — OXYCODONE-ACETAMINOPHEN 5 MG-325 MG TABLET
ORAL_TABLET | ORAL | 0 refills | 25 days | Status: CP | PRN
Start: 2022-08-10 — End: ?

## 2022-08-14 ENCOUNTER — Ambulatory Visit: Admit: 2022-08-14 | Discharge: 2022-08-15 | Payer: MEDICARE | Attending: Internal Medicine | Primary: Internal Medicine

## 2022-08-14 DIAGNOSIS — I10 Essential (primary) hypertension: Principal | ICD-10-CM

## 2022-08-14 DIAGNOSIS — K224 Dyskinesia of esophagus: Principal | ICD-10-CM

## 2022-08-14 DIAGNOSIS — R634 Abnormal weight loss: Principal | ICD-10-CM

## 2022-08-14 DIAGNOSIS — C3431 Malignant neoplasm of lower lobe, right bronchus or lung: Principal | ICD-10-CM

## 2022-08-14 DIAGNOSIS — M545 Chronic midline low back pain without sciatica: Principal | ICD-10-CM

## 2022-08-14 DIAGNOSIS — G8929 Other chronic pain: Principal | ICD-10-CM

## 2022-08-14 DIAGNOSIS — J449 Chronic obstructive pulmonary disease, unspecified: Principal | ICD-10-CM

## 2022-08-14 DIAGNOSIS — R630 Anorexia: Principal | ICD-10-CM

## 2022-08-14 MED ORDER — LISINOPRIL 5 MG TABLET
ORAL_TABLET | Freq: Every day | ORAL | 2 refills | 30 days | Status: CP
Start: 2022-08-14 — End: ?

## 2022-08-14 NOTE — Unmapped (Signed)
error 

## 2022-08-14 NOTE — Unmapped (Signed)
[facility]     Type of visit: face to face    Are you located in [place]? (for virtual visits only) N/A    Reason for visit: Follow up    Questions / Concerns that need to be addressed: abdominal pain    Screening BP- 103/61      PTHomeBP       HCDM reviewed and updated in Epic:    We are working to make sure all of our patients??? wishes are updated in Epic and part of that is documenting a Environmental health practitioner for each patient  A Health Care Decision Maker is someone you choose who can make health care decisions for you if you are not able - who would you most want to do this for you????  is already up to date.    HCDM (patient stated preference): Luna Kitchens - Daughter - [phone]    BPAs completed:      COVID-19 Vaccine Summary  Which COVID-19 Vaccine was administered  Pfizer  Type:  Dates Given:                   If no: Are you interested in scheduling?     Immunization History   Administered Date(s) Administered    COVID-19 VAC,BIVALENT(46YR UP),PFIZER 12/07/2020    COVID-19 VACC,MRNA,(PFIZER)(PF) 05/12/2019, 06/02/2019, 12/10/2019    COVID-19 VACCINE,MRNA(MODERNA)(PF) 07/12/2020    INFLUENZA QUAD ADJUVANTED 56YR UP(FLUAD) 11/26/2020    INFLUENZA QUAD HIGH DOSE 56YRS+(FLUZONE) 11/04/2018    INFLUENZA TIV (TRI) PF (IM) 03/12/2006, 12/18/2008, 11/23/2009    Influenza Vaccine Quad(IM)6 MO-Adult(PF) 12/29/2011, 12/02/2012, 11/20/2013, 11/17/2014, 12/17/2017, 05/08/2022    Influenza Virus Vaccine, unspecified formulation 11/19/2015, 11/13/2016, 11/07/2018, 11/26/2019    PNEUMOCOCCAL POLYSACCHARIDE 23-VALENT 06/01/1994, 01/15/1997, 08/30/2012    Pneumococcal Conjugate 13-Valent 11/20/2013    Td (adult) unspecified formulation 07/17/2003    TdaP 02/23/2012    ZOSTAVAX - ZOSTER VACCINE, LIVE, SQ 02/19/2014       __________________________________________________________________________________________    SCREENINGS COMPLETED IN FLOWSHEETS    HARK Screening       AUDIT PHQ2       PHQ9          P4 Suicidality Screener                GAD7       COPD Assessment       Falls Risk

## 2022-08-15 ENCOUNTER — Ambulatory Visit: Admit: 2022-08-15 | Discharge: 2022-08-16 | Payer: MEDICARE

## 2022-08-15 NOTE — Unmapped (Signed)
HISTORY: [name] is a 77 year old gentleman with a history of diabetes, hypertension, remote history of lung cancer status post left partial lobectomy, and in 12/2021 with radiographic diagnosis of synchronous RLL NSCLC 2.9 and 2.5 cm lesions without pathologic confirmation (nondiagnostic) s/p RT 023, stage 3a CKD, COPD who presents for follow-up.  He is accompanied by his  teenage great granddaughter, Lee Baxter.     Since his last visit on 07/17/2022, patient states that he continues to have pain in his upper abdomen 10 minutes after eating.  He has decreased taste, therefore cannot have an interest in eating.  No nausea or vomiting.  He has lost a few more pounds.  He has normal bowel movements without hematochezia and last BM was recently.  No diarrhea, no blood in the stool.  No dysphagia.  He is drinking the boost drink every day.  He is trying to avoid adding salt or other condiments to his food to make it taste better because he does not want his blood sugar or blood pressure to get high.    Regarding the hypertension, he restarted lisinopril 20 mg and takes it most days but does not take it if SBP is less than 110.  He is no longer having the dizziness episodes like before.  He is not having any vertigo.  He is not taking nifedipine XL 90 mg.    He started cardiopulmonary physical therapy and he finds it enjoyable.  He still uses oxygen 2 L/min but when he does not use it his O2 saturations drop to 85%.  He wants to be able to get outside more but does not want to take the larger portable tank and ask for a smaller oxygen unit.    He is using all of your medications listed below that were written reviewed with him and his great granddaughter.      Patient Active Problem List   Diagnosis    Primary hypertension    Incisional hernia    Hyperlipidemia    Sensorineural hearing loss    Back pain    Allergic rhinitis    Adenomatous colon polyp    COPD, severe (CMS-HCC)    Tobacco abuse    Chewing tobacco use GERD without esophagitis    History of lung cancer    Diabetes mellitus type 2 without retinopathy (CMS-HCC)    Dry eye syndrome of bilateral lacrimal glands    Pseudophakia of both eyes    Myopia with astigmatism and presbyopia, bilateral    Stage 3a chronic kidney disease (CMS-HCC)    Tinnitus of both ears    Lung cancer (CMS-HCC)    Radiation pneumonitis (CMS-HCC)    UTI (urinary tract infection)    Pleural effusion, right    Hypoxemia    Hypokalemia    Hypomagnesemia    Hypophosphatemia    Aspiration pneumonitis (CMS-HCC)    COPD exacerbation (CMS-HCC)      Reviewed and reflects modifications made at the end of the visit.  Current Outpatient Medications   Medication Sig Dispense Refill    atorvastatin (LIPITOR) 40 MG tablet TAKE 1 TABLET BY MOUTH EVERY DAY IN THE EVENING 90 tablet 3    metFORMIN (GLUCOPHAGE) 500 MG tablet Take 1 tablet (500 mg total) by mouth in the morning and 1 tablet (500 mg total) in the evening. Take with meals.      albuterol 2.5 mg /3 mL (0.083 %) nebulizer solution Inhale 3 mL (2.5 mg total) by nebulization every four (  4) hours as needed for wheezing or shortness of breath. (Patient not taking: Reported on 08/14/2022) 60 mL 2    albuterol HFA 90 mcg/actuation inhaler TAKE 2 PUFFS BY MOUTH EVERY 6 HOURS AS NEEDED FOR WHEEZE (Patient not taking: Reported on 07/12/2022) 8 g 5    blood sugar diagnostic (GLUCOSE BLOOD) Strp Disp test strips preferred by insurance plan. Testing qday, Dx: E11.9 (Type 2 DM- controlled) 50 strip 11    blood-glucose meter kit Disp. blood glucose meter kit preferred by patient's insurance. Dx: Diabetes, E11.9 1 each 11    clotrimazole (LOTRIMIN) 1 % cream Apply topically two (2) times a day. 60 g 1    finasteride (PROSCAR) 5 mg tablet TAKE 1 TABLET (5 MG TOTAL) BY MOUTH DAILY. 90 tablet 3    fluticasone propionate (FLONASE) 50 mcg/actuation nasal spray 2 sprays into each nostril daily. 48 mL 7    lancets Misc Disp. lancets #100 or amount allowed, Testing Qday. Dx: E11.9 (Diabetes- controlled) 100 each 11    lisinopril (PRINIVIL,ZESTRIL) 5 MG tablet Take 1 tablet (5 mg total) by mouth daily. 30 tablet 2    loratadine (CLARITIN) 10 mg tablet Take 1 tablet (10 mg total) by mouth daily. (Patient not taking: Reported on 07/12/2022) 30 tablet 11    magnesium oxide (MAG-OX) 400 mg (241.3 mg elemental magnesium) tablet Take 1 tablet (400 mg total) by mouth two (2) times a day. (Patient not taking: Reported on 07/12/2022) 60 tablet 1    MEDICAL SUPPLY ITEM Pt has DM hx of neuropathy w/ callus. Followed for comprehensive care. For med records, fax release to [phone]. 2 Units 0    MEDICAL SUPPLY ITEM 1 pair of diabetic shoes and 3 pairs of inserts.  Pt has DM with neuropathy w/ callus. Followed for comprehensive care. For med records, fax release to [phone] 2 each 0    oxyCODONE-acetaminophen (PERCOCET) 5-325 mg per tablet Take 1 tablet by mouth every four (4) hours as needed for pain. 150 tablet 0    [START ON 09/07/2022] oxyCODONE-acetaminophen (PERCOCET) 5-325 mg per tablet Take 1 tablet by mouth every four (4) hours as needed for pain. 150 tablet 0    SENNA 8.6 mg tablet Take 1 tablet by mouth daily. 30 tablet 11    tamsulosin (FLOMAX) 0.4 mg capsule TAKE 1 CAPSULE BY MOUTH EVERY DAY 90 capsule 3    TRELEGY ELLIPTA 200-62.5-25 mcg DsDv INHALE 1 PUFF IN THE MORNING (Patient not taking: Reported on 07/12/2022) 60 each 5     No current facility-administered medications for this visit.     Allergies   Allergen Reactions    Latex      Added based on information entered during case entry, please review and add reactions, type, and severity as needed        SOCIAL History: as above.  See previous notes.    PHYSICAL:  General: Well-developed, slim and chronically ill-appearing gentleman who is in no respiratory distress.  Vitals:   Vitals:    08/14/22 1108   BP: 103/61   Pulse: 109   Resp: 16   Temp: 35.9 ??C (96.7 ??F)   TempSrc: Temporal   SpO2: 91%   Weight: 54.4 kg (120 lb)   Height: 165.1 cm (5' 5)      BP Readings from Last 3 Encounters:   08/14/22 103/61   08/10/22 112/60   08/08/22 98/50     Wt Readings from Last 3 Encounters:   08/14/22 54.4 kg (120  lb)   08/10/22 54.9 kg (121 lb)   07/25/22 56.4 kg (124 lb 6.4 oz)   07/17/2022 no weight.  07/12/2022 60.3 kg (133 pounds)    HEENT:Atraumatic, EOMI, PERRLA, EACs normal, TMs normal. Oropharynx  Neck:  Supple  Chest: Right basilar crackles that do not clear.  Otherwise, rest of fields clear to auscultation.   CV: Normal S1 and S2, no S3 or murmur.  Abdomen: Healed surgical scars.  Nontender to palpation.  No palpable spleen or liver.  Nontender  Ext:  No pedal edema.  Psychiatric: Bright affect, jovial, joking.  Limited insight, no derailment speech which is fluid.    DIAGNOSTIC TESTS:     07/17/2022: 737, potassium 4.7, BUN 29, creatinine 1.42, EGFR 51, glucose 200.  Magnesium 1.6, normal.  Phosphorus 2.3, slightly low.      ASSESSMENT AND PLANS:    -- 1.  Primary hypertension, with very low blood pressure readings.  Decrease lisinopril to 5 mg a day, new prescription provided.  Asked patient to discard previous lisinopril pills.  Check BMP at next visit.    -- 2.  Chronic low back pain, continue using oxycodone-acetaminophen 5-3 25 every 4 hours for pain.    -- 3.  Weight loss, due to cancer, decreased appetite, poor taste.  He has lost at least 13 more pounds since the last visit.  Addressed this with the patient, educated him regarding this process.  Informed him that at this point, I do not want him to worry about tight blood glucose or BP control.  Asked him to eat whatever he wishes and that he should be seasoned with either salt, Mrs. Dash or other condiments that will make his food more palatable.  Also, I advised that he can take a Boost and mix it with ice cream and make a milkshake and he states he would like a chocolate one that his great granddaughter will make for him.  Increase Boost to 2 a day if not consuming enough food.  He was referred to GI to evaluate GI processes but appointments are very distant so I will change to E-consult.  If weight loss persists, consider megestrol acetate.    -- 4.  Lung carcinoma, status post radiation to the lung.  Being followed by radiation oncology.    --5.  COPD, complicated by radiation pneumonitis, resolved aspiration pneumonitis.  Advised him to use his oxygen 2 L/min at all times even while walking to the bathroom.  Will work with his family to identify the best portable oxygen tank that he allows him to get outside more.  However, I did advise that they would have to bring a back up larger portable oxygen tank in the car with him in case this smaller tank ran out of oxygen.  Will discuss palliative care with the patient at next visit.      This note was dictated using voice recognition software.  Please forgive typos.      Addendum: Spoke with the patient's daughter, Jennette Kettle informing the patient's oxygen company is [employer], [phone] and I will contact him regarding the above portable oxygen system.

## 2022-08-17 DIAGNOSIS — R634 Abnormal weight loss: Principal | ICD-10-CM

## 2022-08-22 ENCOUNTER — Ambulatory Visit: Admit: 2022-08-22 | Payer: MEDICARE

## 2022-08-23 ENCOUNTER — Ambulatory Visit: Admit: 2022-08-23 | Discharge: 2022-08-23 | Payer: MEDICARE

## 2022-08-23 DIAGNOSIS — R0600 Dyspnea, unspecified: Principal | ICD-10-CM

## 2022-08-23 DIAGNOSIS — J449 Chronic obstructive pulmonary disease, unspecified: Principal | ICD-10-CM

## 2022-08-23 DIAGNOSIS — J479 Bronchiectasis, uncomplicated: Principal | ICD-10-CM

## 2022-08-23 MED ORDER — IPRATROPIUM 0.5 MG-ALBUTEROL 3 MG (2.5 MG BASE)/3 ML NEBULIZATION SOLN
Freq: Four times a day (QID) | RESPIRATORY_TRACT | 1 refills | 90 days | Status: CP
Start: 2022-08-23 — End: 2023-02-19
  Filled 2022-08-29: qty 1080, 90d supply, fill #0

## 2022-08-23 MED ORDER — SODIUM CHLORIDE 3 % FOR NEBULIZATION
Freq: Two times a day (BID) | RESPIRATORY_TRACT | 3 refills | 90 days | Status: CP
Start: 2022-08-23 — End: 2023-08-18
  Filled 2022-08-29: qty 720, 90d supply, fill #0

## 2022-08-24 ENCOUNTER — Ambulatory Visit: Admit: 2022-08-24 | Payer: MEDICARE

## 2022-08-29 ENCOUNTER — Ambulatory Visit: Admit: 2022-08-29 | Discharge: 2022-08-30 | Payer: MEDICARE

## 2022-08-31 ENCOUNTER — Ambulatory Visit: Admit: 2022-08-31 | Discharge: 2022-09-01 | Payer: MEDICARE

## 2022-09-04 DIAGNOSIS — J8283 Eosinophilic asthma: Principal | ICD-10-CM

## 2022-09-04 DIAGNOSIS — J4489 Asthma-COPD overlap syndrome (CMS-HCC): Principal | ICD-10-CM

## 2022-09-04 DIAGNOSIS — J4550 Severe persistent asthma, uncomplicated: Principal | ICD-10-CM

## 2022-09-04 MED ORDER — DUPILUMAB 300 MG/2 ML SUBCUTANEOUS PEN INJECTOR
Freq: Once | SUBCUTANEOUS | 0 refills | 0.00000 days | Status: CP
Start: 2022-09-04 — End: 2022-09-04
  Filled 2022-09-12: qty 4, 14d supply, fill #0

## 2022-09-07 ENCOUNTER — Ambulatory Visit: Admit: 2022-09-07 | Discharge: 2022-09-08 | Payer: MEDICARE

## 2022-09-07 MED ORDER — OXYCODONE-ACETAMINOPHEN 5 MG-325 MG TABLET
ORAL_TABLET | ORAL | 0 refills | 25 days | Status: CP | PRN
Start: 2022-09-07 — End: 2022-10-07

## 2022-09-10 DIAGNOSIS — N400 Enlarged prostate without lower urinary tract symptoms: Principal | ICD-10-CM

## 2022-09-11 MED ORDER — FINASTERIDE 5 MG TABLET
ORAL_TABLET | Freq: Every day | ORAL | 3 refills | 90 days | Status: CP
Start: 2022-09-11 — End: 2023-08-14

## 2022-09-11 MED ORDER — EPINEPHRINE 0.3 MG/0.3 ML INJECTION, AUTO-INJECTOR
Freq: Once | INTRAMUSCULAR | 0 refills | 0 days | Status: CP | PRN
Start: 2022-09-11 — End: ?

## 2022-09-11 NOTE — Unmapped (Signed)
[facility]   Patient Onboarding/Medication Counseling    Lee Baxter is a 77 y.o. male with asthma-COPD overlap who I am counseling today on initiation of therapy.  I am speaking to the patient's family member, daughter .    Was a Nurse, learning disability used for this call? No    Verified patient's date of birth / HIPAA.    Specialty medication(s) to be sent: CF/Pulmonary/Asthma: Dupixent 300mg       Non-specialty medications/supplies to be sent: n/a      Medications not needed at this time: n/a       The patient declined counseling on medication administration and missed dose instructions because  they will receive counseling in clinic . The information in the declined sections below are for informational purposes only and was not discussed with patient.      Dupixent (dupilumab)    Medication & Administration     Dosage: Asthma, moderate to severe: Inject 600mg  under the skin as a loading dose followed by 300mg  every 14 days thereafter    Administration:     Dupixent Pen  1. Gather all supplies needed for injection on a clean, flat working surface: medication syringe removed from packaging, alcohol swab, sharps container, etc.  2. Look at the medication label - look for correct medication, correct dose, and check the expiration date  3. Look at the medication - the liquid in the pen should appear clear and colorless to pale yellow  4. Lay the pen on a flat surface and allow it to warm up to room temperature for at least 45 minutes  5. Select injection site - you can use the front of your thigh or your belly (but not the area 2 inches around your belly button); if someone else is giving you the injection you can also use your upper arm in the skin covering your triceps muscle  6. Prepare injection site - wash your hands and clean the skin at the injection site with an alcohol swab and let it air dry, do not touch the injection site again before the injection  7. Hold the middle of the body of the pen and gently pull the needle safety cap straight out. Be careful not to bend the needle. Do not remove until immediately prior to injection  8. Press the pen down onto the injection site at a 90 degree angle.   9. You will hear a click as the injection starts, and then a second click when the injection is ALMOST done. Keep holding the pen against the skin for 5 more seconds after the second click.   10. Check that the pen is empty by looking in the viewing window - the yellow indicator bar should be stopped, and should fill the window.   11. Remove the pen from the skin by lifting straight up.   12. Dispose of the used pen immediately in your sharps disposal container  13. If you see any blood at the injection site, press a cotton ball or gauze on the site and maintain pressure until the bleeding stops, do not rub the injection site    Adherence/Missed dose instructions:  If a dose is missed, administer within 7 days from the missed dose and then resume the original schedule. If the missed dose is not administered within 7 days, you can either wait until the next dose on the original schedule or take your dose now and resume every 14 days from the new injection date. Do  not use 2 doses at the same time or extra doses.      Goals of Therapy     -Reduce impairment - few night-time awakenings; maintenance of normal daily activities; optimization of lung function  -Minimal need (less than 2 days per week) of inhaled short acting beta agonists to relieve symptoms  -Prevention of recurrent exacerbations and need for emergency department/hospital care  -Prevention of reduced lung growth in children  -Maintenance of effective psychosocial functioning    Side Effects & Monitoring Parameters     Injection site reaction (redness, irritation, inflammation localized to the site of administration)  Signs of a common cold - minor sore throat, runny or stuffy nose, etc.  Recurrence of cold sores (herpes simplex)      The following side effects should be reported to the provider:  Signs of a hypersensitivity reaction - rash; hives; itching; red, swollen, blistered, or peeling skin; wheezing; tightness in the chest or throat; difficulty breathing, swallowing, or talking; swelling of the mouth, face, lips, tongue, or throat; etc.  Eye pain or irritation or any visual disturbances  Shortness of breath or worsening of breathing      Contraindications, Warnings, & Precautions     Have your bloodwork checked as you have been told by your prescriber   Birth control pills and other hormone-based birth control may not work as well to prevent pregnancy  Talk with your doctor if you are pregnant, planning to become pregnant, or breastfeeding  Discuss the possible need for holding your dose(s) of Dupixent?? when a planned procedure is scheduled with the prescriber as it may delay healing/recovery timeline       Drug/Food Interactions     Medication list reviewed in Epic. The patient was instructed to inform the care team before taking any new medications or supplements. No drug interactions identified.   Talk with you prescriber or pharmacist before receiving any live vaccinations while taking this medication and after you stop taking it    Storage, Handling Precautions, & Disposal     Store this medication in the refrigerator.  Do not freeze  If needed, you may store at room temperature for up to 14 days  Store in original packaging, protected from light  Do not shake  Dispose of used syringes in a sharps disposal container            Current Medications (including OTC/herbals), Comorbidities and Allergies     Current Outpatient Medications   Medication Sig Dispense Refill    albuterol 2.5 mg /3 mL (0.083 %) nebulizer solution Inhale 3 mL (2.5 mg total) by nebulization every four (4) hours as needed for wheezing or shortness of breath. (Patient not taking: Reported on 08/14/2022) 60 mL 2    albuterol HFA 90 mcg/actuation inhaler TAKE 2 PUFFS BY MOUTH EVERY 6 HOURS AS NEEDED FOR WHEEZE (Patient not taking: Reported on 07/12/2022) 8 g 5    atorvastatin (LIPITOR) 40 MG tablet TAKE 1 TABLET BY MOUTH EVERY DAY IN THE EVENING 90 tablet 3    blood sugar diagnostic (GLUCOSE BLOOD) Strp Disp test strips preferred by insurance plan. Testing qday, Dx: E11.9 (Type 2 DM- controlled) 50 strip 11    blood-glucose meter kit Disp. blood glucose meter kit preferred by patient's insurance. Dx: Diabetes, E11.9 1 each 11    clotrimazole (LOTRIMIN) 1 % cream Apply topically two (2) times a day. 60 g 1    dupilumab 300 mg/2 mL PnIj Inject the contents of 2  pens (600 mg) under the skin once for 1 dose. Loading dose. 4 mL 0    dupilumab 300 mg/2 mL PnIj Inject the contents of 1 pen (300 mg) under the skin every fourteen (14) days. Maintenance dose. 4 mL 11    finasteride (PROSCAR) 5 mg tablet Take 1 tablet (5 mg total) by mouth daily. 90 tablet 3    fluticasone propionate (FLONASE) 50 mcg/actuation nasal spray 2 sprays into each nostril daily. 48 mL 7    ipratropium-albuterol (DUO-NEB) 0.5-2.5 mg/3 mL nebulizer Inhale 1 vial ( 3 mL) by nebulization every six (6) hours. Use as directed 1080 mL 1    lancets Misc Disp. lancets #100 or amount allowed, Testing Qday. Dx: E11.9 (Diabetes- controlled) 100 each 11    lisinopril (PRINIVIL,ZESTRIL) 5 MG tablet Take 1 tablet (5 mg total) by mouth daily. 30 tablet 2    MEDICAL SUPPLY ITEM Pt has DM hx of neuropathy w/ callus. Followed for comprehensive care. For med records, fax release to [phone]. 2 Units 0    MEDICAL SUPPLY ITEM 1 pair of diabetic shoes and 3 pairs of inserts.  Pt has DM with neuropathy w/ callus. Followed for comprehensive care. For med records, fax release to [phone] 2 each 0    metFORMIN (GLUCOPHAGE) 500 MG tablet Take 1 tablet (500 mg total) by mouth in the morning and 1 tablet (500 mg total) in the evening. Take with meals.      oxyCODONE-acetaminophen (PERCOCET) 5-325 mg per tablet Take 1 tablet by mouth every four (4) hours as needed for pain. 150 tablet 0    oxyCODONE-acetaminophen (PERCOCET) 5-325 mg per tablet Take 1 tablet by mouth every four (4) hours as needed for pain. 150 tablet 0    SENNA 8.6 mg tablet Take 1 tablet by mouth daily. 30 tablet 11    sodium chloride 3 % NEBULIZER solution Inhale 1 vial ( 4 mL) by nebulization two (2) times a day. 720 mL 3    tamsulosin (FLOMAX) 0.4 mg capsule TAKE 1 CAPSULE BY MOUTH EVERY DAY 90 capsule 3    TRELEGY ELLIPTA 200-62.5-25 mcg DsDv INHALE 1 PUFF IN THE MORNING (Patient not taking: Reported on 07/12/2022) 60 each 5     No current facility-administered medications for this visit.       Allergies   Allergen Reactions    Latex      Added based on information entered during case entry, please review and add reactions, type, and severity as needed       Patient Active Problem List   Diagnosis    Primary hypertension    Incisional hernia    Hyperlipidemia    Sensorineural hearing loss    Back pain    Allergic rhinitis    Adenomatous colon polyp    COPD, severe (CMS-HCC)    Tobacco abuse    Chewing tobacco use    GERD without esophagitis    History of lung cancer    Diabetes mellitus type 2 without retinopathy (CMS-HCC)    Dry eye syndrome of bilateral lacrimal glands    Pseudophakia of both eyes    Myopia with astigmatism and presbyopia, bilateral    Stage 3a chronic kidney disease (CMS-HCC)    Tinnitus of both ears    Lung cancer (CMS-HCC)    Radiation pneumonitis (CMS-HCC)    UTI (urinary tract infection)    Pleural effusion, right    Hypoxemia    Hypokalemia    Hypomagnesemia  Hypophosphatemia    Aspiration pneumonitis (CMS-HCC)    COPD exacerbation (CMS-HCC)       Reviewed and up to date in Epic.    Appropriateness of Therapy     Acute infections noted within Epic:  No active infections  Patient reported infection: None    Is medication and dose appropriate based on diagnosis and infection status? Yes    Prescription has been clinically reviewed: Yes      Baseline Quality of Life Assessment      How many days over the past month did your asthma-COPD overlap  keep you from your normal activities? For example, brushing your teeth or getting up in the morning. Daughter states asthma is not well controlled but unsure how frequently he requires rescue inhaler. She states is experiences SOB often.    Financial Information     Medication Assistance provided: Prior Authorization    Anticipated copay of $0 reviewed with patient. Verified delivery address.    Delivery Information     Scheduled delivery date: 09/13/22    Expected start date: 09/13/22    Medication will be delivered via UPS to the prescription address in [facility].  This shipment will not require a signature.      Explained the services we provide at [facility] and that each month we would call to set up refills.  Stressed importance of returning phone calls so that we could ensure they receive their medications in time each month.  Informed patient that we should be setting up refills 7-10 days prior to when they will run out of medication.  A pharmacist will reach out to perform a clinical assessment periodically.  Informed patient that a welcome packet, containing information about our pharmacy and other support services, a Notice of Privacy Practices, and a drug information handout will be sent.      The patient or caregiver noted above participated in the development of this care plan and knows that they can request review of or adjustments to the care plan at any time.      Patient or caregiver verbalized understanding of the above information as well as how to contact the pharmacy at [phone] option 4 with any questions/concerns.  The pharmacy is open Monday through Friday 8:30am-4:30pm.  A pharmacist is available 24/7 via pager to answer any clinical questions they may have.    Patient Specific Needs     Does the patient have any physical, cognitive, or cultural barriers? No    Does the patient have adequate living arrangements? (i.e. the ability to store and take their medication appropriately) Yes    Did you identify any home environmental safety or security hazards? No    Patient prefers to have medications discussed with  Family Member     Is the patient or caregiver able to read and understand education materials at a high school level or above? No    Patient's primary language is  English     Is the patient high risk? No    SOCIAL DETERMINANTS OF HEALTH     At the [facility], we have learned that life circumstances - like trouble affording food, housing, utilities, or transportation can affect the health of many of our patients.   That is why we wanted to ask: are you currently experiencing any life circumstances that are negatively impacting your health and/or quality of life? Patient declined to answer    Social Determinants of Health  Financial Resource Strain: Low Risk  (06/28/2022)    Overall Financial Resource Strain (CARDIA)     Difficulty of Paying Living Expenses: Not hard at all   Internet Connectivity: No Internet connectivity concern identified (09/30/2021)    Internet Connectivity     Do you have access to internet services: Yes     How do you connect to the internet: Personal Device at home     Is your internet connection strong enough for you to watch video on your device without major problems?: Yes     Do you have enough data to get through the month?: Yes     Does at least one of the devices have a camera that you can use for video chat?: Yes   Food Insecurity: No Food Insecurity (06/28/2022)    Hunger Vital Sign     Worried About Running Out of Food in the Last Year: Never true     Ran Out of Food in the Last Year: Never true   Tobacco Use: High Risk (09/07/2022)    Patient History     Smoking Tobacco Use: Former     Smokeless Tobacco Use: Current     Passive Exposure: Not on file   Housing/Utilities: Low Risk  (06/28/2022)    Housing/Utilities     Within the past 12 months, have you ever stayed: outside, in a car, in a tent, in an overnight shelter, or temporarily in someone else's home (i.e. couch-surfing)?: No     Are you worried about losing your housing?: No     Within the past 12 months, have you been unable to get utilities (heat, electricity) when it was really needed?: No   Alcohol Use: Not At Risk (05/23/2021)    Alcohol Use     How often do you have a drink containing alcohol?: Never     How many drinks containing alcohol do you have on a typical day when you are drinking?: 1 - 2     How often do you have 5 or more drinks on one occasion?: Never   Transportation Needs: No Transportation Needs (06/28/2022)    PRAPARE - Transportation     Lack of Transportation (Medical): No     Lack of Transportation (Non-Medical): No   Substance Use: Low Risk  (09/30/2021)    Substance Use     Taken prescription drugs for non-medical reasons: Never     Taken illegal drugs: Never     Patient indicated they have taken drugs in the past year for non-medical reasons: Yes, [positive answer(s)]: Not on file   Health Literacy: Not on file   Physical Activity: Not on file   Interpersonal Safety: Not on file   Stress: Not on file   Intimate Partner Violence: Not At Risk (05/23/2021)    Humiliation, Afraid, Rape, and Kick questionnaire     Fear of Current or Ex-Partner: No     Emotionally Abused: No     Physically Abused: No     Sexually Abused: No   Depression: At risk (07/17/2022)    PHQ-2     PHQ-2 Score: 3   Social Connections: Not on file       Would you be willing to receive help with any of the needs that you have identified today? Not applicable       Oliva Bustard, PharmD  [facility] Specialty Pharmacist

## 2022-09-11 NOTE — Unmapped (Signed)
[facility] Specialty Medication Onboarding    Specialty Medication: DUPIXENT PEN 300 mg/2 mL Pnij (dupilumab)  Prior Authorization: Approved   Financial Assistance: No - copay  <$25  Final Copay/Day Supply: $0 / 14 loading and 28 maintenance.    Insurance Restrictions: Yes - max 1 month supply     Notes to Pharmacist:    Credit Card on File: not applicable    The triage team has completed the benefits investigation and has determined that the patient is able to fill this medication at [facility]. Please contact the patient to complete the onboarding or follow up with the prescribing physician as needed.

## 2022-09-12 NOTE — Unmapped (Signed)
Addended by: Darnelle Bos on: 09/11/2022 05:29 PM     Modules accepted: Orders

## 2022-09-15 ENCOUNTER — Ambulatory Visit: Admit: 2022-09-15 | Discharge: 2022-09-16 | Payer: MEDICARE

## 2022-09-15 ENCOUNTER — Institutional Professional Consult (permissible substitution): Admit: 2022-09-15 | Discharge: 2022-09-16 | Payer: MEDICARE

## 2022-09-15 DIAGNOSIS — R0902 Hypoxemia: Principal | ICD-10-CM

## 2022-09-15 DIAGNOSIS — J449 Chronic obstructive pulmonary disease, unspecified: Principal | ICD-10-CM

## 2022-09-15 LAB — BLOOD GAS, ARTERIAL
BASE EXCESS ARTERIAL: 1.6 (ref -2.0–2.0)
HCO3 ARTERIAL: 26 mmol/L (ref 22–27)
O2 SATURATION ARTERIAL: 92.1 % — ABNORMAL LOW (ref 94.0–100.0)
PCO2 ARTERIAL: 37.2 mmHg (ref 35.0–45.0)
PH ARTERIAL: 7.45 (ref 7.35–7.45)
PO2 ARTERIAL: 60.2 mmHg — ABNORMAL LOW (ref 80.0–110.0)

## 2022-09-15 MED ORDER — EMPTY CONTAINER
2 refills | 0 days
Start: 2022-09-15 — End: ?

## 2022-09-15 NOTE — Unmapped (Signed)
Pt arrived to clinic for teaching of Dupixent. Pt was given education on the purpose of medication and was informed of potential side effects. Pt was given instruction of how to prep, inject, and dispose of the medication. Pt was able to verify understanding using the teach back method.     Patient's daughter was able to safely inject medication into patient's left and right arms. No complications noted during injection. Pt stayed one hour after injection for monitoring. No signs of moderate/severe reaction noted during stay.      All questions and concerns appropriately answered during visit. Pt instructed to call the clinic nurse line should they develop any questions or concerns regarding medication.       Nursing Assessment completed.   Drug allergies assessed.     Med supplied: [facility]Patient Supplied     Drug Administered: Dupixent 600mg  (loading dose)  Route: SubQ  Frequency: 14 days     Right arm: 300 mg  Lot #: [id number] / Exp 05/10/2024    Left arm: 300 mg  Lot #: [id number] / Exp 05/10/2024    Assessment:  Tolerated procedure well, no side effects. Patient has epi pen.   Patient aware to contact office if side effects occur.

## 2022-09-15 NOTE — Unmapped (Signed)
Patient here in clinic for pulmonary testing and first Dupixient injection.  Daughter Optometrist) with patient today.    -asking for an order for a portable oxygen concentrator.  Six minute walk reviewed and order placed per order.  Faxed to [facility].

## 2022-09-15 NOTE — Unmapped (Signed)
[name]    Lee Baxter, DOB: 1945-08-25  Phone: [phone] (home)     All above HIPAA information was verified with patient's family member, daughter Lee Baxter).     Was a Nurse, learning disability used for this call? No    Specialty Medication(s):   Inflammatory Disorders: Dupixent     Current Outpatient Medications   Medication Sig Dispense Refill    albuterol 2.5 mg /3 mL (0.083 %) nebulizer solution Inhale 3 mL (2.5 mg total) by nebulization every four (4) hours as needed for wheezing or shortness of breath. (Patient not taking: Reported on 08/14/2022) 60 mL 2    albuterol HFA 90 mcg/actuation inhaler TAKE 2 PUFFS BY MOUTH EVERY 6 HOURS AS NEEDED FOR WHEEZE (Patient not taking: Reported on 07/12/2022) 8 g 5    atorvastatin (LIPITOR) 40 MG tablet TAKE 1 TABLET BY MOUTH EVERY DAY IN THE EVENING 90 tablet 3    blood sugar diagnostic (GLUCOSE BLOOD) Strp Disp test strips preferred by insurance plan. Testing qday, Dx: E11.9 (Type 2 DM- controlled) 50 strip 11    blood-glucose meter kit Disp. blood glucose meter kit preferred by patient's insurance. Dx: Diabetes, E11.9 1 each 11    clotrimazole (LOTRIMIN) 1 % cream Apply topically two (2) times a day. 60 g 1    dupilumab 300 mg/2 mL PnIj Inject the contents of 1 pen (300 mg) under the skin every fourteen (14) days. 4 mL 11    empty container Misc Use as directed to dispose of Dupixent pens 1 each 2    EPINEPHrine (EPIPEN) 0.3 mg/0.3 mL injection Inject 0.3 mL (0.3 mg total) into the muscle once as needed for anaphylaxis for up to 1 dose. 1 each 0    finasteride (PROSCAR) 5 mg tablet Take 1 tablet (5 mg total) by mouth daily. 90 tablet 3    fluticasone propionate (FLONASE) 50 mcg/actuation nasal spray 2 sprays into each nostril daily. 48 mL 7    ipratropium-albuterol (DUO-NEB) 0.5-2.5 mg/3 mL nebulizer Inhale 1 vial ( 3 mL) by nebulization every six (6) hours. Use as directed 1080 mL 1    lancets Misc Disp. lancets #100 or amount allowed, Testing Qday. Dx: E11.9 (Diabetes- controlled) 100 each 11    lisinopril (PRINIVIL,ZESTRIL) 5 MG tablet Take 1 tablet (5 mg total) by mouth daily. 30 tablet 2    MEDICAL SUPPLY ITEM Pt has DM hx of neuropathy w/ callus. Followed for comprehensive care. For med records, fax release to [phone]. 2 Units 0    MEDICAL SUPPLY ITEM 1 pair of diabetic shoes and 3 pairs of inserts.  Pt has DM with neuropathy w/ callus. Followed for comprehensive care. For med records, fax release to [phone] 2 each 0    metFORMIN (GLUCOPHAGE) 500 MG tablet Take 1 tablet (500 mg total) by mouth in the morning and 1 tablet (500 mg total) in the evening. Take with meals.      oxyCODONE-acetaminophen (PERCOCET) 5-325 mg per tablet Take 1 tablet by mouth every four (4) hours as needed for pain. 150 tablet 0    oxyCODONE-acetaminophen (PERCOCET) 5-325 mg per tablet Take 1 tablet by mouth every four (4) hours as needed for pain. 150 tablet 0    SENNA 8.6 mg tablet Take 1 tablet by mouth daily. 30 tablet 11    sodium chloride 3 % NEBULIZER solution Inhale 1 vial ( 4 mL) by nebulization two (2) times a day. 720  mL 3    tamsulosin (FLOMAX) 0.4 mg capsule TAKE 1 CAPSULE BY MOUTH EVERY DAY 90 capsule 3    TRELEGY ELLIPTA 200-62.5-25 mcg DsDv INHALE 1 PUFF IN THE MORNING (Patient not taking: Reported on 07/12/2022) 60 each 5     No current facility-administered medications for this visit.        Changes to medications: Olyn reports no changes at this time.    Allergies   Allergen Reactions    Latex      Added based on information entered during case entry, please review and add reactions, type, and severity as needed       Changes to allergies: No    SPECIALTY MEDICATION ADHERENCE     Dupixent 300  mg/49mL : 0 days of medicine on hand     Medication Adherence    Patient reported X missed doses in the last month: 0  Specialty Medication: Dupixent 300 mg/46mL - inject 600mg  once (loading dose), then 300mg  Q14d thereafter  Patient is on additional specialty medications: No  Patient is on more than two specialty medications: No  Any gaps in refill history greater than 2 weeks in the last 3 months: no  Demonstrates understanding of importance of adherence: yes  Informant: child/children          Specialty medication(s) dose(s) confirmed: Patient reports changes to the regimen as follows: Completed Dupixent loading dose today (7/5). Will start maintenance dose of 300mg  Q14d on 7/19      Are there any concerns with adherence? No    Adherence counseling provided? Not needed    CLINICAL MANAGEMENT AND INTERVENTION      Clinical Benefit Assessment:    Do you feel the medicine is effective or helping your condition? No    Clinical Benefit counseling provided? Reasonable expectations discussed: He received first dose today in clinic. We discussed it can take up to 4 months to determine clinical benefit of Dupixent    Adverse Effects Assessment:    Are you experiencing any side effects? No    Are you experiencing difficulty administering your medicine? No    Quality of Life Assessment:    Quality of Life    Rheumatology  Oncology  Dermatology  Cystic Fibrosis          How many days over the past month did your asthma  keep you from your normal activities? For example, brushing your teeth or getting up in the morning. Patient declined to answer    Have you discussed this with your provider? Not needed    Acute Infection Status:    Acute infections noted within Epic:  No active infections  Patient reported infection: None    Therapy Appropriateness:    Is therapy appropriate and patient progressing towards therapeutic goals? Yes, therapy is appropriate and should be continued    DISEASE/MEDICATION-SPECIFIC INFORMATION      For patients on injectable medications: Patient currently has 0 doses left.  Next injection is scheduled for 7/19.    Asthma and Allergy: Have you had an asthma exacerbation in the last 30 days? No  Have you needed to use your rescue inhaler more often than usual in the last 30 days? No  Have you needed to take steroids for your asthma in the last 30 days? No    PATIENT SPECIFIC NEEDS     Does the patient have any physical, cognitive, or cultural barriers? No    Is the patient high risk? No  Did the patient require a clinical intervention? No    Does the patient require physician intervention or other additional services (i.e., nutrition, smoking cessation, social work)? No    SOCIAL DETERMINANTS OF HEALTH     At the [facility], we have learned that life circumstances - like trouble affording food, housing, utilities, or transportation can affect the health of many of our patients.   That is why we wanted to ask: are you currently experiencing any life circumstances that are negatively impacting your health and/or quality of life? Patient declined to answer    Social Determinants of Health     Financial Resource Strain: Low Risk  (06/28/2022)    Overall Financial Resource Strain (CARDIA)     Difficulty of Paying Living Expenses: Not hard at all   Internet Connectivity: No Internet connectivity concern identified (09/30/2021)    Internet Connectivity     Do you have access to internet services: Yes     How do you connect to the internet: Personal Device at home     Is your internet connection strong enough for you to watch video on your device without major problems?: Yes     Do you have enough data to get through the month?: Yes     Does at least one of the devices have a camera that you can use for video chat?: Yes   Food Insecurity: No Food Insecurity (06/28/2022)    Hunger Vital Sign     Worried About Running Out of Food in the Last Year: Never true     Ran Out of Food in the Last Year: Never true   Tobacco Use: High Risk (09/07/2022)    Patient History     Smoking Tobacco Use: Former     Smokeless Tobacco Use: Current     Passive Exposure: Not on file   Housing/Utilities: Low Risk  (06/28/2022)    Housing/Utilities Within the past 12 months, have you ever stayed: outside, in a car, in a tent, in an overnight shelter, or temporarily in someone else's home (i.e. couch-surfing)?: No     Are you worried about losing your housing?: No     Within the past 12 months, have you been unable to get utilities (heat, electricity) when it was really needed?: No   Alcohol Use: Not At Risk (05/23/2021)    Alcohol Use     How often do you have a drink containing alcohol?: Never     How many drinks containing alcohol do you have on a typical day when you are drinking?: 1 - 2     How often do you have 5 or more drinks on one occasion?: Never   Transportation Needs: No Transportation Needs (06/28/2022)    PRAPARE - Transportation     Lack of Transportation (Medical): No     Lack of Transportation (Non-Medical): No   Substance Use: Low Risk  (09/30/2021)    Substance Use     Taken prescription drugs for non-medical reasons: Never     Taken illegal drugs: Never     Patient indicated they have taken drugs in the past year for non-medical reasons: Yes, [positive answer(s)]: Not on file   Health Literacy: Not on file   Physical Activity: Not on file   Interpersonal Safety: Not on file   Stress: Not on file   Intimate Partner Violence: Not At Risk (05/23/2021)    Humiliation, Afraid, Rape, and Kick questionnaire     Fear of  Current or Ex-Partner: No     Emotionally Abused: No     Physically Abused: No     Sexually Abused: No   Depression: At risk (07/17/2022)    PHQ-2     PHQ-2 Score: 3   Social Connections: Not on file       Would you be willing to receive help with any of the needs that you have identified today? Not applicable       SHIPPING     Specialty Medication(s) to be Shipped:   CF/Pulmonary/Asthma: Dupixent 300 mg/49mL    Other medication(s) to be shipped:  sharps container     Changes to insurance: No    Delivery Scheduled: Yes, Expected medication delivery date: 09/26/22.     Medication will be delivered via UPS to the confirmed prescription address in [facility].    The patient will receive a drug information handout for each medication shipped and additional FDA Medication Guides as required.  Verified that patient has previously received a Conservation officer, [name], mining.    The patient or caregiver noted above participated in the development of this care plan and knows that they can request review of or adjustments to the care plan at any time.      All of the patient's questions and concerns have been addressed.    Oliva Bustard, PharmD   [facility] Specialty Pharmacist

## 2022-09-15 NOTE — Unmapped (Signed)
ARTERIAL BLOOD GAS    Date: 09/15/2022    Time: 1008    Draw site: []  Right radial [x]  Left radial    FiO2: 21%    Positive Modified Allen's test: [x]  YES []  NO    Comments:  ABG was drawn from L. radial artery without incident.

## 2022-09-18 DIAGNOSIS — E1142 Type 2 diabetes mellitus with diabetic polyneuropathy: Principal | ICD-10-CM

## 2022-09-18 MED ORDER — METFORMIN 500 MG TABLET
ORAL_TABLET | 3 refills | 0 days
Start: 2022-09-18 — End: ?

## 2022-09-18 NOTE — Unmapped (Signed)
Last ordered: 2 months ago (06/29/2022) by Simone Curia, MD    With 180 tabs/3 refills

## 2022-09-19 ENCOUNTER — Ambulatory Visit: Admit: 2022-09-19 | Discharge: 2022-09-20 | Payer: MEDICARE

## 2022-09-19 NOTE — Unmapped (Signed)
[facility] CARDIOPULMONARY PHYSICAL THERAPY [place]  OUTPATIENT PHYSICAL THERAPY  09/19/2022  Note Type: Treatment Note  Note Date Range: Visit 7    Patient Name: Lee Baxter  Date of Birth:1945/03/30  Diagnosis:   Encounter Diagnosis   Name Primary?    COPD, severe (CMS-HCC) Yes     Referring MD:  Deeann Cree, *     Date of Onset of Impairment-06/25/2022  Date PT Care Plan Established or Reviewed-08/28/2022  Date PT Treatment Started-08/08/2022   Plan of Care Effective Date: 08/08/2022 - 11/03/2022         Assessment/Plan:    Assessment  Assessment details:    Pt tolerated session well. Pt performed 3 biking bouts for 5 min each today. Pt's vitals remained stable for the first 2 biking bouts; however, during the 3rd session his SpO2 dropped to 87%. Cueing was provided for PLB and pt was capable of recovering without titration of O2. HEP was developed today and h/o and theraband were provided. Patient will continue to benefit from skilled PT intervention to address the above impairments and maximize his strength, endurance, balance, safety, and independence.             Impairments: decreased endurance, decreased strength and shortness of breath      Personal Factors/Comorbidities: 3+    Specific Comorbidities: HTN, COPD severe, lung CA, GERD, T2DM, HLD            Positive Prognosis Rationale: motivated for treatment.  Negative Prognosis Rationale: medical status/condition, chronicity of condition and severity of symptoms.      Therapy Goals      Goals:      STG  1. 6 minute walk test distance will be greater than 432ft to demonstrate improved aerobic capacity to be able to complete activities at home and in the community.  2. Patient will ambulate for 10 minutes without rest breaks to demonstrate improved endurance and ability to access the community.   3. Pt will perform 11 chair stands in 30 seconds in order to demonstrate improvements in functional strength.     LTG  1. 6 minute walk test distance will be greater than 760ft to demonstrate improved aerobic capacity to be able to complete activities at home and in the community.   2. Pt will perform >11 chair stands in 30 seconds in order to demonstrate improvements in functional strength.  3. SpO2 will be >/=88% on least amount of supplemental O2 with all activities to demonstrate adequate pacing and titration of supplemental oxygen for safety.  4. Pt will demonstrate knowledge regarding safe monitoring and titration of supplemental O2.    Plan    Therapy options: will be seen for skilled physical therapy services    Planned therapy interventions: Home Exercise Program, Gait Training, Endurance Activites, Education - Patient, Diaphragmatic/Pursed-lip Breathing, Civil engineer, contracting, Location manager, Airway Clearance, Manual Therapy, Neuromuscular Re-education, Self-Care/Home Training, Therapeutic Activities and Therapeutic Exercises      Frequency: 2x week    Duration in weeks: 12 weeks    Education provided to: patient.    Education provided: Treatment options and plan, Symptom management, Importance of Therapy, Body awareness, Body mechanics, HEP, Indications/contraindications to exercises and Role of therapy in Rehabilitation    Education results: verbalized good understanding and needs further instruction.        Total Session Time: 40    Treatment rendered today:      TE: 40 min    Recumbent stepper  Level 1  x 5 min  HR: 112bpm  SpO2: 93% on 3L    2 min recovery    Level 1 x 5 min  HR: 115bpm  SpO2: 89% on 3L    2 min recovery    Level 1 x 5 min  HR: 123bpm  SpO2: 87% on 3L, cueing for PLB pt recovered to >88% quickly    Seated hip abduction GTB 2x12  LAQ 2x12  Hip flexion 2x12    HEP developed, h/o and theraband provided    =       Subjective:     Subjective:  Pt continues to report breathlessness with exertion. He notes some stomach pain today.  Pain:     Current pain rating:  0  Pain Comments: Only hurts when he eats    Location:  Lower abdomen    Red Flags:  None    Precautions:  O2 desaturation and Fall risk    Current Braces/Orthoses:  None    Equipment Currently Used:  Shower chair and Supplemental Oxygen    No physical limitations    Current Functional Status:  Limited exercise, limited standing tolerance, limited walking tolerance and limited household activities  Social Support:     Lives Environment:  One-story house, stairs and hand rails (4 STE)    Lives with:  Family members  Barriers to Learning:  Visual (wears glasses)  Treatments:     None      Current treatment: physical therapy        Objective:   Objective    Vitals:    09/19/22 1633   BP: 132/70   BP Site: L Arm   BP Position: Sitting   BP Cuff Size: Medium   Pulse: 112   SpO2: (!) 89%                       Therapeutic Interventions Charges  $$ Therapeutic Exercise [mins]: 40                 I attest that I have reviewed the above information.  Signed: Bennie Dallas, PT  09/19/2022 5:12 PM

## 2022-09-21 ENCOUNTER — Ambulatory Visit: Admit: 2022-09-21 | Discharge: 2022-09-22 | Payer: MEDICARE

## 2022-09-21 NOTE — Unmapped (Signed)
[facility] CARDIOPULMONARY PHYSICAL THERAPY [place]  OUTPATIENT PHYSICAL THERAPY  09/21/2022  Note Type: Treatment Note  Note Date Range: Visit 8    Patient Name: Lee Baxter  Date of Birth:[birth year]  Diagnosis:   Encounter Diagnosis   Name Primary?    COPD, severe (CMS-HCC) Yes     Referring MD:  Deeann Cree, *     Date of Onset of Impairment-06/25/2022  Date PT Care Plan Established or Reviewed-08/28/2022  Date PT Treatment Started-08/08/2022   Plan of Care Effective Date: 08/08/2022 - 11/03/2022         Assessment/Plan:    Assessment  Assessment details:    Pt tolerated session well. Pt was capable of progressing his first biking time to 6 min (1 min increase). He desaturated to 88% during 2nd biking bout and was titrated to 4L for the final session. Cueing provided throughout session for PLB and paced breathing. Patient will continue to benefit from skilled PT intervention to address the above impairments and maximize his strength, endurance, balance, safety, and independence.             Impairments: decreased endurance, decreased strength and shortness of breath      Personal Factors/Comorbidities: 3+    Specific Comorbidities: HTN, COPD severe, lung CA, GERD, T2DM, HLD            Positive Prognosis Rationale: motivated for treatment.  Negative Prognosis Rationale: medical status/condition, chronicity of condition and severity of symptoms.      Therapy Goals      Goals:      STG  1. 6 minute walk test distance will be greater than 440ft to demonstrate improved aerobic capacity to be able to complete activities at home and in the community.  2. Patient will ambulate for 10 minutes without rest breaks to demonstrate improved endurance and ability to access the community.   3. Pt will perform 11 chair stands in 30 seconds in order to demonstrate improvements in functional strength.     LTG  1. 6 minute walk test distance will be greater than 735ft to demonstrate improved aerobic capacity to be able to complete activities at home and in the community.   2. Pt will perform >11 chair stands in 30 seconds in order to demonstrate improvements in functional strength.  3. SpO2 will be >/=88% on least amount of supplemental O2 with all activities to demonstrate adequate pacing and titration of supplemental oxygen for safety.  4. Pt will demonstrate knowledge regarding safe monitoring and titration of supplemental O2.    Plan    Therapy options: will be seen for skilled physical therapy services    Planned therapy interventions: Home Exercise Program, Gait Training, Endurance Activites, Education - Patient, Diaphragmatic/Pursed-lip Breathing, Civil engineer, contracting, Location manager, Airway Clearance, Manual Therapy, Neuromuscular Re-education, Self-Care/Home Training, Therapeutic Activities and Therapeutic Exercises      Frequency: 2x week    Duration in weeks: 12 weeks    Education provided to: patient.    Education provided: Treatment options and plan, Symptom management, Importance of Therapy, Body awareness, Body mechanics, HEP, Indications/contraindications to exercises and Role of therapy in Rehabilitation    Education results: verbalized good understanding and needs further instruction.        Total Session Time: 40    Treatment rendered today:      TE: 40 min    Recumbent stepper  Level 1 x 6 min  HR: 115bpm  SpO2: 90% on 3L  2 min recovery    Level 1 x 5 min  HR: 117bpm  SpO2: 88% on 3L    2 min recovery    Level 1 x 5 min  HR: 117bpm  SpO2: 90% on 4L    Standing hip abduction 2x15  Standing hip extension 2x12  Hip flexion 2x15  HR: 113bpm  SpO2: 93% on 4L    2x6 STS  HR: 113bpm  SpO2: 97% on 4L    =       Subjective:     Subjective:  Pt reports swimmy headedness today. He denies any other new symptoms. Pt denies any soreness after his last session and notes performing his HEP as instructed.  Pain:     Current pain rating:  0  Pain Comments: Only hurts when he eats    Location:  Lower abdomen    Red Flags: None    Precautions:  O2 desaturation and Fall risk    Current Braces/Orthoses:  None    Equipment Currently Used:  Shower chair and Supplemental Oxygen    No physical limitations    Current Functional Status:  Limited exercise, limited standing tolerance, limited walking tolerance and limited household activities  Social Support:     Lives Environment:  One-story house, stairs and hand rails (4 STE)    Lives with:  Family members  Barriers to Learning:  Visual (wears glasses)  Treatments:     None      Current treatment: physical therapy        Objective:   Objective    Vitals:    09/21/22 1529   BP: 140/80   BP Site: L Arm   BP Position: Sitting   BP Cuff Size: Medium   Pulse: 109   SpO2: 91%   Weight: 56 kg (123 lb 8 oz)                         Therapeutic Interventions Charges  $$ Therapeutic Exercise [mins]: 40                 I attest that I have reviewed the above information.  Signed: Bennie Dallas, PT  09/21/2022 4:12 PM

## 2022-09-25 MED FILL — EMPTY CONTAINER: 120 days supply | Qty: 1 | Fill #0

## 2022-09-25 MED FILL — DUPIXENT 300 MG/2 ML SUBCUTANEOUS PEN INJECTOR: SUBCUTANEOUS | 28 days supply | Qty: 4 | Fill #0

## 2022-09-26 ENCOUNTER — Ambulatory Visit: Admit: 2022-09-26 | Discharge: 2022-09-27 | Payer: MEDICARE

## 2022-09-26 NOTE — Unmapped (Signed)
[facility] CARDIOPULMONARY PHYSICAL THERAPY [place]  OUTPATIENT PHYSICAL THERAPY  09/26/2022  Note Type: Treatment Note  Note Date Range: Visit 9    Patient Name: Lee Baxter  Date of Birth:[birth year]  Diagnosis:   Encounter Diagnosis   Name Primary?    COPD, severe (CMS-HCC) Yes     Referring MD:  Deeann Cree, *     Date of Onset of Impairment-06/25/2022  Date PT Care Plan Established or Reviewed-08/28/2022  Date PT Treatment Started-08/08/2022   Plan of Care Effective Date: 08/08/2022 - 11/03/2022         Assessment/Plan:    Assessment  Assessment details:    Pt tolerated session well and is making progress toward goals. His SpO2 remained >90% during today's session on 3L. Strengthening exercises were progressed and resistance was adjusted to achieve optimal muscle fatigue and tolerance. Cueing was provided throughout session for PLB and paced breathing. Rest breaks were taken between exercises for recovery. Patient will continue to benefit from skilled PT intervention to address the above impairments and maximize his strength, endurance, balance, safety, and independence.             Impairments: decreased endurance, decreased strength and shortness of breath      Personal Factors/Comorbidities: 3+    Specific Comorbidities: HTN, COPD severe, lung CA, GERD, T2DM, HLD            Positive Prognosis Rationale: motivated for treatment.  Negative Prognosis Rationale: medical status/condition, chronicity of condition and severity of symptoms.      Therapy Goals      Goals:      STG  1. 6 minute walk test distance will be greater than 443ft to demonstrate improved aerobic capacity to be able to complete activities at home and in the community.  2. Patient will ambulate for 10 minutes without rest breaks to demonstrate improved endurance and ability to access the community.   3. Pt will perform 11 chair stands in 30 seconds in order to demonstrate improvements in functional strength.     LTG  1. 6 minute walk test distance will be greater than 771ft to demonstrate improved aerobic capacity to be able to complete activities at home and in the community.   2. Pt will perform >11 chair stands in 30 seconds in order to demonstrate improvements in functional strength.  3. SpO2 will be >/=88% on least amount of supplemental O2 with all activities to demonstrate adequate pacing and titration of supplemental oxygen for safety.  4. Pt will demonstrate knowledge regarding safe monitoring and titration of supplemental O2.    Plan    Therapy options: will be seen for skilled physical therapy services    Planned therapy interventions: Home Exercise Program, Gait Training, Endurance Activites, Education - Patient, Diaphragmatic/Pursed-lip Breathing, Civil engineer, contracting, Location manager, Airway Clearance, Manual Therapy, Neuromuscular Re-education, Self-Care/Home Training, Therapeutic Activities and Therapeutic Exercises      Frequency: 2x week    Duration in weeks: 12 weeks    Education provided to: patient.    Education provided: Treatment options and plan, Symptom management, Importance of Therapy, Body awareness, Body mechanics, HEP, Indications/contraindications to exercises and Role of therapy in Rehabilitation    Education results: verbalized good understanding and needs further instruction.        Total Session Time: 40    Treatment rendered today:      TE: 40 min    Recumbent stepper  Level 1 x 6 min  HR: 120bpm  SpO2: 93% on 3L    2 min recovery    Level 1 x 5 min  HR: 116bpm  SpO2: 93% on 3L    2 min recovery    Level 1 x 5 min  HR: 124bpm  SpO2: 92% on 4L    Leg press GTB 2x20 each LE  DF GTB 2x20  Seated hip abduction step-outs GTB 2x10    =       Subjective:     Subjective:  Pt reports that his HR has been elevated all morning. He denies any chest pain/tightness or any other abnormal symptoms.  Pain:     Current pain rating:  0  Pain Comments: Only hurts when he eats    Location:  Lower abdomen    Red Flags:  None Precautions:  O2 desaturation and Fall risk    Current Braces/Orthoses:  None    Equipment Currently Used:  Shower chair and Supplemental Oxygen    No physical limitations    Current Functional Status:  Limited exercise, limited standing tolerance, limited walking tolerance and limited household activities  Social Support:     Lives Environment:  One-story house, stairs and hand rails (4 STE)    Lives with:  Family members  Barriers to Learning:  Visual (wears glasses)  Treatments:     None      Current treatment: physical therapy        Objective:   Objective    Vitals:    09/26/22 1426   BP: 130/60   BP Site: L Arm   BP Position: Sitting   BP Cuff Size: Medium   Pulse: 119   SpO2: 91%                         Therapeutic Interventions Charges  $$ Therapeutic Exercise [mins]: 40                 I attest that I have reviewed the above information.  SignedBennie Dallas, PT  09/26/2022 3:14 PM

## 2022-09-28 ENCOUNTER — Ambulatory Visit: Admit: 2022-09-28 | Discharge: 2022-09-29 | Payer: MEDICARE

## 2022-09-28 NOTE — Unmapped (Signed)
[facility] PHYSICAL THERAPY [place]  OUTPATIENT PHYSICAL THERAPY  09/28/2022  Note Type: Progress Note  Note Date Range: Visit 10    Patient Name: Marge Duncans  Date of Birth:August 22, 1945  Diagnosis:   Encounter Diagnosis   Name Primary?    COPD, severe (CMS-HCC) Yes     Referring MD:  Deeann Cree, *     Date of Onset of Impairment-06/25/2022  Date PT Care Plan Established or Reviewed-08/28/2022  Date PT Treatment Started-08/08/2022   Plan of Care Effective Date: 08/08/2022 - 11/03/2022         Assessment/Plan:    Assessment  Assessment details:    Pt tolerated session well and is making steady progress toward goals. Pt significantly improved on the by 46ft; however, this distance is 46% of predicted distance for his demographic indicating continued impairments in aerobic capacity. He demonstrated improved functional strength by performing 4 additional chair stands during the 30 sec chair stand test; however, continues to be at an increased risk of falling. Overall, pt has made a significant improvement since the start of therapy and will continue to benefit from skilled PT intervention to address the above impairments and maximize his strength, endurance, balance, safety, and independence.             Impairments: decreased endurance, decreased strength and shortness of breath      Personal Factors/Comorbidities: 3+    Specific Comorbidities: HTN, COPD severe, lung CA, GERD, T2DM, HLD            Positive Prognosis Rationale: motivated for treatment.  Negative Prognosis Rationale: medical status/condition, chronicity of condition and severity of symptoms.      Therapy Goals      Goals:      STG  1. 6 minute walk test distance will be greater than 455ft to demonstrate improved aerobic capacity to be able to complete activities at home and in the community. (MET- 645ft)  2. Patient will ambulate for 10 minutes without rest breaks to demonstrate improved endurance and ability to access the community.  (NOT MET- 4 min)  3. Pt will perform 11 chair stands in 30 seconds in order to demonstrate improvements in functional strength. (NOT MET- 10)     LTG  1. 6 minute walk test distance will be greater than 726ft to demonstrate improved aerobic capacity to be able to complete activities at home and in the community.   2. Pt will perform >11 chair stands in 30 seconds in order to demonstrate improvements in functional strength.  3. SpO2 will be >/=88% on least amount of supplemental O2 with all activities to demonstrate adequate pacing and titration of supplemental oxygen for safety.  4. Pt will demonstrate knowledge regarding safe monitoring and titration of supplemental O2.    Plan    Therapy options: will be seen for skilled physical therapy services    Planned therapy interventions: Home Exercise Program, Gait Training, Endurance Activites, Education - Patient, Diaphragmatic/Pursed-lip Breathing, Civil engineer, contracting, Location manager, Airway Clearance, Manual Therapy, Neuromuscular Re-education, Self-Care/Home Training, Therapeutic Activities and Therapeutic Exercises      Frequency: 2x week    Duration in weeks: 12 weeks    Education provided to: patient.    Education provided: Treatment options and plan, Symptom management, Importance of Therapy, Body awareness, Body mechanics, HEP, Indications/contraindications to exercises and Role of therapy in Rehabilitation    Education results: verbalized good understanding and needs further instruction.        Total  Session Time: 40    Treatment rendered today:      Physical Performance Test: 15 min  , 30 sec STS, discussion regarding progress and goals.    TE: 20 min    Recumbent stepper  Level 1  2 x 5 min  HR: 103bpm  SpO2: 94% on 3L    2 min recovery between             Subjective:     History of Present Condition     History of Present Condition/Chief Complaint:  Severe COPD  Subjective:  Pt notes no issues since his last treatment session. Pt notes that he is moving around a lot more than he was when he started therapy.   Pain:     Current pain rating:  0  Pain Comments: Only hurts when he eats    Location:  Lower abdomen    Red Flags:  None    Precautions:  O2 desaturation and Fall risk    Current Braces/Orthoses:  None    Equipment Currently Used:  Shower chair and Supplemental Oxygen    No physical limitations    Current Functional Status:  Limited exercise, limited standing tolerance, limited walking tolerance and limited household activities  Social Support:     Lives Environment:  One-story house, stairs and hand rails (4 STE)    Lives with:  Family members  Barriers to Learning:  Visual (wears glasses)  Treatments:     None      Current treatment: physical therapy        Objective:   Objective    Vitals:    09/28/22 1528   BP: 160/80   BP Site: L Arm   BP Position: Sitting   BP Cuff Size: Medium   Pulse: 98   SpO2: 97%       6 Minute Walk Test:     Post   FiO2 3L   Oxygen Delivery Device High flow nasal cannula   SpO2 89 %   HR 112 bpm   Blood pressure 182/90   RPE 4/10   DOE 4/10   Assistive Device none   Rest breaks 1 seated   Distance (ft) 680   Reason for Early Termination    % Predicted Distance 46 %   METs 2.0 METs     BP: 156/80 mmHg after 4 min seated rest    30 sec STS: 10  Pt performed 10 chair stands in 30 seconds indicating mild impairments in functional strength.  This is a change of 4 chair stands from the last assessment indicating a significant improvement in functional strength.    *A below average score indicates risk for falls  Age Men Women   60-64 <14 <12   65-69 <12 <11   70-74 <12 <10   75-79 <11 <10   80-84 <10 <9   85-89 <8 <8                   PT Evaluation Charges  $$ Physical Performance Test/Measurement [mins]: 15     Therapeutic Interventions Charges  $$ Therapeutic Exercise [mins]: 20                 I attest that I have reviewed the above information.  Signed: Bennie Dallas, PT  09/28/2022 4:21 PM

## 2022-10-03 ENCOUNTER — Ambulatory Visit: Admit: 2022-10-03 | Discharge: 2022-10-04 | Payer: MEDICARE

## 2022-10-04 NOTE — Unmapped (Signed)
[facility]  [address]  [address]  [place], [place] [address]  Phone: [phone]     Lee Baxter presented for PT treatment session reporting consistent HTN readings at home with his home BP machine. BP assessed to and was 176/49mmHg. Pt rested for 5 min and was reassessed: 174/46mmHg. PT session was held and pt was instructed to call his MD.                  Signed: Bennie Dallas, PT  10/04/2022 12:03 PM

## 2022-10-09 DIAGNOSIS — J449 Chronic obstructive pulmonary disease, unspecified: Principal | ICD-10-CM

## 2022-10-09 NOTE — Unmapped (Signed)
Daughter called for discontinue order for oxygen with [employer]. Patient is set up with [employer] and happy with his equipment.  Order faxed to [phone]

## 2022-10-10 ENCOUNTER — Ambulatory Visit: Admit: 2022-10-10 | Payer: MEDICARE

## 2022-10-12 ENCOUNTER — Ambulatory Visit: Admit: 2022-10-12 | Payer: MEDICARE

## 2022-10-12 NOTE — Unmapped (Signed)
[facility]  [address]  [address]  [place], [place] [address]  Phone: [phone]     KHARSON RASMUSSON has been unable to attend rehab the last 2 sessions d/t continued elevated BP. Pt plans to see his MD early next week to address this.                    Signed: Bennie Dallas, PT  10/12/2022 2:31 PM

## 2022-10-16 ENCOUNTER — Ambulatory Visit: Admit: 2022-10-16 | Discharge: 2022-10-17 | Payer: MEDICARE | Attending: Internal Medicine | Primary: Internal Medicine

## 2022-10-16 DIAGNOSIS — G8929 Other chronic pain: Principal | ICD-10-CM

## 2022-10-16 DIAGNOSIS — I10 Essential (primary) hypertension: Principal | ICD-10-CM

## 2022-10-16 DIAGNOSIS — I872 Venous insufficiency (chronic) (peripheral): Principal | ICD-10-CM

## 2022-10-16 DIAGNOSIS — M545 Chronic midline low back pain without sciatica: Principal | ICD-10-CM

## 2022-10-16 DIAGNOSIS — M5090 Cervical disc disorder, unspecified, unspecified cervical region: Principal | ICD-10-CM

## 2022-10-16 MED ORDER — LISINOPRIL 10 MG TABLET
ORAL_TABLET | Freq: Every day | ORAL | 3 refills | 90 days | Status: CP
Start: 2022-10-16 — End: 2023-10-16

## 2022-10-16 MED ORDER — NIFEDIPINE ER 30 MG TABLET,EXTENDED RELEASE 24 HR
ORAL_TABLET | Freq: Every day | ORAL | 3 refills | 90 days | Status: CP
Start: 2022-10-16 — End: 2023-10-16

## 2022-10-16 MED ORDER — OXYCODONE-ACETAMINOPHEN 5 MG-325 MG TABLET
ORAL | 0 refills | 25 days | Status: CP | PRN
Start: 2022-10-16 — End: ?

## 2022-10-16 NOTE — Unmapped (Signed)
1.  Increase your lisinopril to 10 mg a day (2 pills of the 5 mg tablets until you run out and pick up the new 2 mg tablet).  2.  Start taking the nifedipine 30 mg pill they are sent to your pharmacy.  3.  Start checking your blood pressure readings and send them to me in about 4-6 weeks.  We want your blood pressure to be 130/80 or less.  4.  Use your new support stockings on your legs during the day, take them off to go to bed.

## 2022-10-16 NOTE — Unmapped (Signed)
HISTORY: [name] is a 77 year old gentleman with a history of diabetes, hypertension, remote history of lung cancer status post left partial lobectomy, and in 12/2021 with radiographic diagnosis of synchronous RLL NSCLC 2.9 and 2.5 cm lesions without pathologic confirmation (nondiagnostic) s/p RT 023, stage 3a CKD, newly diagnosed asthma-COPD overlap on oxygen 3 L/min who presents for follow-up.  He is accompanied by his  teenage great granddaughter, Lee Baxter.     Patient last visit, patient was seen by Dr. Maia Plan of pulmonary on 08/23/2018 for who diagnosed him with asthma-COPD overlap syndrome.  Patient was prescribed dupilumab that he is tolerated very well and he thinks it is helping him.  He is also has an [employer] oxygen portable tank that allows him to get about better.  He is taking all of his pulmonary medications and other medications.    He was undergoing cardiopulmonary rehabilitation but this last few sessions were not performed because his blood pressure readings were very high.  On 10/03/2022 BP was 176/96 and 174/94 at home.  Has had episodic higher BP readings since August 24, 2022, reviewing the records.  Patient states that on his own he tried to modify his antihypertensive regimen, first increasing lisinopril to 10 mg a day for 3 days and then to 40 mg for 2-3 days without improvement.  2 days ago he took a nifedipine XL 90 mg pill he thinks helped his blood pressure but there is no record of his BP reading he cannot recall it.  Today before taking medicines the pressure was 145/55 at home he took lisinopril 5 mg.  His 10/12/2022 rehab session was postponed due to elevated blood pressure readings.  He states that sometimes he has leg swelling mostly during the day but and when he wakes in the morning legs are nonswollen.  No change in breathing.    Regarding his diabetes, he recalls being at 120 this morning and no other readings to report are available.  He takes metformin 1000 mg twice a day.    He still has chronic pain of his neck despite cervical spine surgery, no sciatica reported.  He request refills of his oxycodone-acetaminophen 5-3 25 that he uses 5 times a day and it helps him.    He has been eating more and has gained some weight, by our records 2 pounds.  He is still having some of the liquid protein drinks.    He still chews tobacco.    Patient Active Problem List   Diagnosis    Primary hypertension    Incisional hernia    Hyperlipidemia    Sensorineural hearing loss    Back pain    Allergic rhinitis    Adenomatous colon polyp    COPD, severe (CMS-HCC)    Tobacco abuse    Chewing tobacco use    GERD without esophagitis    History of lung cancer    Diabetes mellitus type 2 without retinopathy (CMS-HCC)    Dry eye syndrome of bilateral lacrimal glands    Pseudophakia of both eyes    Myopia with astigmatism and presbyopia, bilateral    Stage 3a chronic kidney disease (CMS-HCC)    Tinnitus of both ears    Lung cancer (CMS-HCC)    Radiation pneumonitis (CMS-HCC)    UTI (urinary tract infection)    Pleural effusion, right    Hypoxemia    Hypokalemia    Hypomagnesemia    Hypophosphatemia    Aspiration pneumonitis (CMS-HCC)    COPD  exacerbation (CMS-HCC)      Reviewed and reflex modifications made at the end of the visit.  Current Outpatient Medications   Medication Sig Dispense Refill    atorvastatin (LIPITOR) 40 MG tablet TAKE 1 TABLET BY MOUTH EVERY DAY IN THE EVENING 90 tablet 3    blood sugar diagnostic (GLUCOSE BLOOD) Strp Disp test strips preferred by insurance plan. Testing qday, Dx: E11.9 (Type 2 DM- controlled) 50 strip 11    blood-glucose meter kit Disp. blood glucose meter kit preferred by patient's insurance. Dx: Diabetes, E11.9 1 each 11    dupilumab 300 mg/2 mL PnIj Inject the contents of 1 pen (300 mg) under the skin every fourteen (14) days. 4 mL 11    empty container Misc Use as directed to dispose of Dupixent pens 1 each 2    finasteride (PROSCAR) 5 mg tablet Take 1 tablet (5 mg total) by mouth daily. 90 tablet 3    fluticasone propionate (FLONASE) 50 mcg/actuation nasal spray 2 sprays into each nostril daily. 48 mL 7    ipratropium-albuterol (DUO-NEB) 0.5-2.5 mg/3 mL nebulizer Inhale 1 vial ( 3 mL) by nebulization every six (6) hours. Use as directed 1080 mL 1    lancets Misc Disp. lancets #100 or amount allowed, Testing Qday. Dx: E11.9 (Diabetes- controlled) 100 each 11    metFORMIN (GLUCOPHAGE) 500 MG tablet Take 1 tablet (500 mg total) by mouth in the morning and 1 tablet (500 mg total) in the evening. Take with meals.      SENNA 8.6 mg tablet Take 1 tablet by mouth daily. 30 tablet 11    sodium chloride 3 % NEBULIZER solution Inhale 1 vial ( 4 mL) by nebulization two (2) times a day. 720 mL 3    tamsulosin (FLOMAX) 0.4 mg capsule TAKE 1 CAPSULE BY MOUTH EVERY DAY 90 capsule 3    albuterol 2.5 mg /3 mL (0.083 %) nebulizer solution Inhale 3 mL (2.5 mg total) by nebulization every four (4) hours as needed for wheezing or shortness of breath. (Patient not taking: Reported on 08/14/2022) 60 mL 2    albuterol HFA 90 mcg/actuation inhaler TAKE 2 PUFFS BY MOUTH EVERY 6 HOURS AS NEEDED FOR WHEEZE (Patient not taking: Reported on 07/12/2022) 8 g 5    clotrimazole (LOTRIMIN) 1 % cream Apply topically two (2) times a day. (Patient not taking: Reported on 10/16/2022) 60 g 1    EPINEPHrine (EPIPEN) 0.3 mg/0.3 mL injection Inject 0.3 mL (0.3 mg total) into the muscle once as needed for anaphylaxis for up to 1 dose. (Patient not taking: Reported on 10/16/2022) 1 each 0    lisinopril (PRINIVIL,ZESTRIL) 10 MG tablet Take 1 tablet (10 mg total) by mouth daily. 90 tablet 3    MEDICAL SUPPLY ITEM Pt has DM hx of neuropathy w/ callus. Followed for comprehensive care. For med records, fax release to [phone]. (Patient not taking: Reported on 10/16/2022) 2 Units 0    MEDICAL SUPPLY ITEM 1 pair of diabetic shoes and 3 pairs of inserts.  Pt has DM with neuropathy w/ callus. Followed for comprehensive care. For med records, fax release to [phone] (Patient not taking: Reported on 10/16/2022) 2 each 0    NIFEdipine (PROCARDIA XL) 30 MG 24 hr tablet Take 1 tablet (30 mg total) by mouth daily. 90 tablet 3    [START ON 12/11/2022] oxyCODONE-acetaminophen (PERCOCET) 5-325 mg per tablet Take 1 tablet by mouth every four (4) hours as needed for pain. 150 tablet 0    [  START ON 11/11/2022] oxyCODONE-acetaminophen (PERCOCET) 5-325 mg per tablet Take 1 tablet by mouth every four (4) hours as needed for pain. 150 tablet 0    oxyCODONE-acetaminophen (PERCOCET) 5-325 mg per tablet Take 1 tablet by mouth every four (4) hours as needed for pain. 150 tablet 0    TRELEGY ELLIPTA 200-62.5-25 mcg DsDv INHALE 1 PUFF IN THE MORNING (Patient not taking: Reported on 07/12/2022) 60 each 5     No current facility-administered medications for this visit.     Allergies   Allergen Reactions    Latex      Added based on information entered during case entry, please review and add reactions, type, and severity as needed        SOCIAL History: as above.  He has a widower, lives with great-granddaughter Lee Baxter who cooks for him and looks out for him.  His daughter Jennette Kettle checks on him often.    PHYSICAL:  General: Well-developed, well-nourished gentleman who is in no respiratory distress, sitting calmly in the chair.  Vitals:   Vitals:    10/16/22 1112   BP: 133/75   Pulse: 97   Resp: 16   Temp: 36.3 ??C (97.3 ??F)   TempSrc: Temporal   SpO2: 94%   Weight: 56.7 kg (125 lb)   Height: 165.1 cm (5' 5)      BP Readings from Last 3 Encounters:   10/16/22 133/75   09/28/22 160/80   09/26/22 130/60     Wt Readings from Last 3 Encounters:   10/16/22 56.7 kg (125 lb)   09/21/22 56 kg (123 lb 8 oz)   09/15/22 55.3 kg (122 lb)       HEENT: Conjunctivae normal.  Anicteric sclerae.    Chest:  Clear to asucultation bilaterally, good air entry, no crackles.  CV: Normal S1 and S2, no S3 or murmur.  Ext: Trace bilateral edema  Psychiatric: Bright affect, smiles, jokes.    DIAGNOSTIC TESTS:     Reviewed in epic.        ASSESSMENT AND PLANS:    -- 1.  Primary hypertension, in a patient with prior difficulty control blood pressure within became hypotensive due to cancer, now blood pressure have been elevated of late.  As he is made various changes to his blood pressure regimen and we have limited information from home BP readings, we will make modifications that may not be ideal.    --I have asked him to increase lisinopril to 10 mg a day (for now can take 2 lisinopril 5 mg tablets, new prescription for 10 mg prescribed.    --Start nifedipine XL 30 mg a day, prescription sent to his pharmacy.    --Would have like to see him in 6 weeks but he prefers to return in 3 months.  Needs BMP at next visit.    --2.  Chronic neck pain status post surgery.  Refilled oxycodone 5-3 25 #150 for first month, #150 for the second month, #150 for the third month.    -- 3.  Asthma-overlap syndrome, breathing more comfortably with dupilumab and he will continue along with other pulmonary medications listed above.  Continue oxygen 3 L/min.  Continue cardiopulmonary rehab now that BP readings have improved.    -- 4.  Leg edema, probable element of venous insufficiency.  Prescribed support stockings to be worn during the day removed at night.    Follow-up with me in 3 months or as needed.    This note was dictated using  voice recognition software.  Please forgive typos.

## 2022-10-16 NOTE — Unmapped (Signed)
[facility] Refill Coordination Note    Specialty Medication(s) to be Shipped:   Inflammatory Disorders: Dupixent    Other medication(s) to be shipped: No additional medications requested for fill at this time     Lee Baxter, DOB: September 29, 1945  Phone: [phone] (home)       All above HIPAA information was verified with patient's family member, Lee Baxter.     Was a Nurse, learning disability used for this call? No    Completed refill call assessment today to schedule patient's medication shipment from the [facility] [phone]).  All relevant notes have been reviewed.     Specialty medication(s) and dose(s) confirmed: Regimen is correct and unchanged.   Changes to medications: Lee Baxter reports no changes at this time.  Changes to insurance: No  New side effects reported not previously addressed with a pharmacist or physician: None reported  Questions for the pharmacist: No    Confirmed patient received a Conservation officer, [name], mining with first shipment. The patient will receive a drug information handout for each medication shipped and additional FDA Medication Guides as required.       DISEASE/MEDICATION-SPECIFIC INFORMATION        For patients on injectable medications: Patient currently has 0 doses left.  Next injection is scheduled for 10/24/2022.    SPECIALTY MEDICATION ADHERENCE              Were doses missed due to medication being on hold? No    Dupixent 300mg /16mL : 0 days of medicine on hand       REFERRAL TO PHARMACIST     Referral to the pharmacist: Not needed      [facility]     Shipping address confirmed in Epic.       Delivery Scheduled: Yes, Expected medication delivery date: 10/18/2022.     Medication will be delivered via UPS to the prescription address in Epic WAM.    Lee Baxter, PharmD   [facility] Specialty Pharmacist

## 2022-10-16 NOTE — Unmapped (Signed)
[facility] at [facility]     Type of visit: face to face    Are you located in [place]? (for virtual visits only) N/A    Reason for visit: Follow up    Questions / Concerns that need to be addressed:     Screening BP- 133/75         PTHomeBP         HCDM reviewed and updated in Epic:    We are working to make sure all of our patients??? wishes are updated in Epic and part of that is documenting a Environmental health practitioner for each patient  A Health Care Decision Maker is someone you choose who can make health care decisions for you if you are not able - who would you most want to do this for you????  is already up to date.    HCDM (patient stated preference): Luna Kitchens - Daughter - [phone]    BPAs completed:      COVID-19 Vaccine Summary  Which COVID-19 Vaccine was administered  Pfizer  Type:  Dates Given:                   If no: Are you interested in scheduling?     Immunization History   Administered Date(s) Administered    COVID-19 VAC,BIVALENT(56YR UP),PFIZER 12/07/2020    COVID-19 VACC,MRNA,(PFIZER)(PF) 05/12/2019, 06/02/2019, 12/10/2019    COVID-19 VACCINE,MRNA(MODERNA)(PF) 07/12/2020    INFLUENZA QUAD ADJUVANTED 2YR UP(FLUAD) 11/26/2020    INFLUENZA QUAD HIGH DOSE 2YRS+(FLUZONE) 11/04/2018    INFLUENZA TIV (TRI) PF (IM) 03/12/2006, 12/18/2008, 11/23/2009    Influenza Vaccine Quad(IM)6 MO-Adult(PF) 12/29/2011, 12/02/2012, 11/20/2013, 11/17/2014, 12/17/2017, 05/08/2022    Influenza Virus Vaccine, unspecified formulation 11/19/2015, 11/13/2016, 11/07/2018, 11/26/2019    PNEUMOCOCCAL POLYSACCHARIDE 23-VALENT 06/01/1994, 01/15/1997, 08/30/2012    Pneumococcal Conjugate 13-Valent 11/20/2013    Td (adult) unspecified formulation 07/17/2003    TdaP 02/23/2012    ZOSTAVAX - ZOSTER VACCINE, LIVE, SQ 02/19/2014       __________________________________________________________________________________________    SCREENINGS COMPLETED IN FLOWSHEETS    HARK Screening       AUDIT  AUDIT - C Score (Part 1): 0    PHQ2       PHQ9          P4 Suicidality Screener                GAD7       COPD Assessment       Falls Risk

## 2022-10-17 MED FILL — DUPIXENT 300 MG/2 ML SUBCUTANEOUS PEN INJECTOR: SUBCUTANEOUS | 28 days supply | Qty: 4 | Fill #1

## 2022-10-26 ENCOUNTER — Ambulatory Visit: Admit: 2022-10-26 | Discharge: 2022-10-26 | Payer: MEDICARE

## 2022-11-06 DIAGNOSIS — E1142 Type 2 diabetes mellitus with diabetic polyneuropathy: Principal | ICD-10-CM

## 2022-11-06 MED ORDER — METFORMIN 500 MG TABLET
ORAL_TABLET | 3 refills | 0 days | Status: CP
Start: 2022-11-06 — End: ?

## 2022-11-07 NOTE — Unmapped (Signed)
Refilled metformin 500 mg, #180, 3 refills

## 2022-11-08 NOTE — Unmapped (Signed)
[facility] Refill Coordination Note    Specialty Medication(s) to be Shipped:   CF/Pulmonary/Asthma: DUPIXENT PEN 300 mg/2 mL Pnij (dupilumab)    Other medication(s) to be shipped: No additional medications requested for fill at this time     Lee Baxter, DOB: 01-06-1946  Phone: [phone] (home)       All above HIPAA information was verified with patient's family member, daughter Lee Baxter.     Was a Nurse, learning disability used for this call? No    Completed refill call assessment today to schedule patient's medication shipment from the [facility] [phone]).  All relevant notes have been reviewed.     Specialty medication(s) and dose(s) confirmed: Regimen is correct and unchanged.   Changes to medications: Lee Baxter reports no changes at this time.  Changes to insurance: No  New side effects reported not previously addressed with a pharmacist or physician: None reported  Questions for the pharmacist: No    Confirmed patient received a Conservation officer, [name], mining with first shipment. The patient will receive a drug information handout for each medication shipped and additional FDA Medication Guides as required.       DISEASE/MEDICATION-SPECIFIC INFORMATION        For patients on injectable medications: Patient currently has 0 doses left.  Next injection is scheduled for 11/16/22.    SPECIALTY MEDICATION ADHERENCE     Medication Adherence    Patient reported X missed doses in the last month: 0  Specialty Medication: DUPIXENT PEN 300 mg/2 mL Pnij (dupilumab)  Patient is on additional specialty medications: No  Patient is on more than two specialty medications: No  Any gaps in refill history greater than 2 weeks in the last 3 months: no  Demonstrates understanding of importance of adherence: yes  Informant: child/children  Reliability of informant: reliable  Provider-estimated medication adherence level: good  Patient is at risk for Non-Adherence: No  Reasons for non-adherence: no problems identified              Were doses missed due to medication being on hold? No    DUPIXENT PEN 300 mg/2 mL Pnij (dupilumab)   : 0 doses of medicine on hand       REFERRAL TO PHARMACIST     Referral to the pharmacist: Not needed      SHIPPING     Shipping address confirmed in Epic.       Delivery Scheduled: Yes, Expected medication delivery date: 11/16/22.     Medication will be delivered via UPS to the prescription address in Epic WAM.    Lee Baxter Shared [facility] Specialty Technician

## 2022-11-11 MED ORDER — OXYCODONE-ACETAMINOPHEN 5 MG-325 MG TABLET
ORAL | 0 refills | 25 days | Status: CP | PRN
Start: 2022-11-11 — End: 2022-12-11

## 2022-11-15 ENCOUNTER — Ambulatory Visit: Admit: 2022-11-15 | Discharge: 2022-11-16 | Payer: MEDICAID

## 2022-11-15 DIAGNOSIS — J479 Bronchiectasis, uncomplicated: Principal | ICD-10-CM

## 2022-11-15 DIAGNOSIS — J4489 Asthma-COPD overlap syndrome (CMS-HCC): Principal | ICD-10-CM

## 2022-11-15 DIAGNOSIS — J8283 Eosinophilic asthma: Principal | ICD-10-CM

## 2022-11-15 MED FILL — DUPIXENT 300 MG/2 ML SUBCUTANEOUS PEN INJECTOR: SUBCUTANEOUS | 28 days supply | Qty: 4 | Fill #2

## 2022-11-15 NOTE — Unmapped (Signed)
Pulmonary Clinic -follow-up visit    Referring Physician :  Marlyne Beards  PCP:     Deeann Cree, MD  Reason for Consult:   Advanced destructive emphysema/COPD    HISTORY:     History of Present Illness:  Mr. Gable is a 77 y.o. male with a history of COPD, hypertension, prior history of left upper lobe adenocarcinoma status post partial lobectomy followed by right lower lobe non-small cell lung cancer not pathologically confirmed status post radiation in November 2023, complicated by radiation pneumonitis status post receiving steroids in February 2024, history of GERD and CKD as well, whom we are seeing in consultation requested by Marlyne Beards for evaluation of  advanced obstructive emphysema/COPD .    Patient comes in here today with his daughter Jennette Kettle, to establish care.    Significant medical history: Does not recollect having asthma as a child.  Smoked up to a pack a day for 35 years, quit 25 years ago.  However thereafter started chewing tobacco and she was a pack over 2 days.  Denies any vaping or any other inhalational products.  He was diagnosed with left upper lobe adenocarcinoma in the 1990s and underwent partial lobectomy.  During follow-up, he was found to have 2 nodules, in the right lower lobe, which were mildly PET avid, underwent a transbronchial biopsy however was not diagnostic.  With this being very concerning for cancer, he underwent radiation in November 2023.  Thereafter he developed complication from radiation and radiation pneumonitis receiving steroids in the month of February, with a weekly taper of 10 mg starting at 40 mg.  The empiric radiation was with 1000 cGy for 5 fractions, completed 09 February 2022.  Radiation pneumonitis was diagnosed in February 2024 with his symptoms being shortness of breath cough and phlegm.  He started requiring oxygen thereafter and now uses 2 L of oxygen at rest on exertion and at nighttime.    He was admitted in April 2024, due to similar symptoms of cough and shortness of breath when pulmonary was consulted.  A pleural effusion was also noted on the right side which was drained, cultures were negative, cytology negative for cancer.  Bronchoscopy with BAL was also performed with negative cultures, negative Fungitell, negative BAL galactomannan and negative cytology for malignancy.    Pulmonary was asked if he required no steroids at that time and due to the fact that he had completed a steroid taper, it was thought not necessary.  He was treated with a COPD exacerbation with a short course of steroids and antibiotics.  He was also given 3% saline during the hospitalization for clearance of his airways as he had developed traction bronchiectasis in his right lower lobe from the radiation pneumonitis.    Currently, he is on Trelegy Ellipta, does 1 puff every day.  Is not on any chronic steroids or azithromycin.    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  Since our last visit, we have done a few things.  A 6-minute walk test was done which showed that patient required 2 to 3 L oxygen on exertion.  Arterial blood gas was done which did not show hypercapnia.  Pulmonary function testing showed moderate obstruction with some reversibility, with gas trapping, and a severely low DLCO. We also had started the patient on Dupixent since her last visit.  Symptomatically, patient notes that he is doing a lot better than last time, very happy and cheerful, not on the wheelchair anymore and walked himself with a cane with his granddaughter.  He notes that the nebulizer treatment and the shots along with the inhaler have really helped his breathing symptoms and that he has clear mucus production now and not congested all the time.  He does not note any use of steroids or antibiotics in the last 3 months since my last visit.  The shortness of breath is still present however he says he is able to manage his activities better than before.  He also notes completing his pulmonary rehab however as his blood pressure increased he had to stop it.  He is getting his blood pressure managed better by his primary care doctor now.    Past Medical History:  Past Medical History:   Diagnosis Date    Adenomatous colon polyp     Asthma     Cervical disc disorder 04/17/2013    cervical spine MRI from February 2014 discharge DJD and foraminal stenosis at C4- 5, C3- 4 and C6- 7.     Cervical stenosis of spinal canal 11/06/2019    COPD (chronic obstructive pulmonary disease) (CMS-HCC)     Diabetes mellitus (CMS-HCC) Dx 1990    Type II    Diverticulosis     Hemorrhoids, internal     HL (hearing loss)     Hypertension     Lung cancer (CMS-HCC) 1992    PVD (posterior vitreous detachment), left eye     Sinusitis     Temporomandibular joint disorders 01/15/2012     Past Surgical History:   Procedure Laterality Date    CATARACT EXTRACTION W/ INTRAOCULAR LENS IMPLANT Left 02/13/2006    Hecker MD    CATARACT EXTRACTION W/ INTRAOCULAR LENS IMPLANT Right 03/27/2006    Hecker MD    CHOLECYSTECTOMY      HERNIA REPAIR      KIDNEY STONE SURGERY      LUNG LOBECTOMY Left 1992    PR ALLOGRAFT FOR SPINE SURGERY ONLY MORSELIZED N/A 11/06/2019    Procedure: ALLOGRAFT FOR SPINE SURGERY ONLY; MORSELIZED;  Surgeon: Nemiah Commander, MD;  Location: [facility] OR [facility];  Service: Orthopedics    PR ARTHRD PST/PSTLAT TQ 1NTRSPC CRV BELW C2 SEGMENT N/A 11/06/2019    Procedure: ARTHRODESIS, POSTERIOR OR POSTEROLATERAL TECHNIQUE, SINGLE LEVEL; CERVICAL BELOW C2 SEGMENT;  Surgeon: Nemiah Commander, MD;  Location: [facility] OR [facility];  Service: Orthopedics    PR ARTHRODESIS PST/PSTLAT TQ 1NTRSPC EA ADDL NTRSPC N/A 11/06/2019    Procedure: ARTHRODESIS, POSTERIOR OR POSTEROLATERAL TECHNIQUE, SINGLE LEVEL; EACH ADDITIONAL VERTEBRAL SEGMENT   x4;  Surgeon: Nemiah Commander, MD;  Location: [facility] OR [facility];  Service: Orthopedics    PR AUTOGRAFT SPINE SURGERY LOCAL FROM SAME INCISION N/A 11/06/2019    Procedure: AUTOGRAFT/SPINE SURG  ONLY (W/HARVEST GRAFT); LOCAL (EG, RIB/SPINOUS PROC, LAM FRGMT) OBTAIN FROM SAME INCIS;  Surgeon: Nemiah Commander, MD;  Location: [facility] OR [facility];  Service: Orthopedics    PR BRNSCHSC TNDSC EBUS DX/TX INTERVENTION [facility] LES Right 11/21/2021    Procedure: BRONCH, RIGID OR FLEXIBLE, INCLUDING FLUORO GUIDANCE, WHEN PERFORMED; WITH TRANSENDOSCOPIC EBUS DURING BRONCHOSCOPIC DIAGNOSTIC OR THERAPEUTIC INTERVENTION(S) FOR PERIPHERAL LESION(S);  Surgeon: Jerelyn Charles, MD;  Location: MAIN OR [place];  Service: Pulmonary    PR BRONCHOSCOPY,COMPUTER ASSIST/IMAGE-GUIDED NAVIGATION N/A 11/21/2021    Procedure: ROBOT ION BRONCHOSCOPY,RIGID OR FLEXIBLE,INCLUDE FLUORO WHEN PERFORMED; W/COMPUTER-ASSIST,IMAGE-GUIDED NAVIGATION;  Surgeon: Jerelyn Charles, MD;  Location: MAIN OR [place];  Service: Pulmonary    PR BRONCHOSCOPY,COMPUTER ASSIST/IMAGE-GUIDED NAVIGATION N/A 11/21/2021    Procedure: BRONCHOSCOPY, RIGID OR FLEXIBLE, INCLUDE FLUORO WHEN PERFORMED; W/COMPUTER-ASSIST, IMAGE-GUIDED NAVIGATION;  Surgeon: Jerelyn Charles, MD;  Location: MAIN OR [place];  Service: Pulmonary    PR BRONCHOSCOPY,DIAGNOSTIC W LAVAGE Right 06/27/2022    Procedure: BRONCHOSCOPY, RIGID OR FLEXIBLE, INCLUDE FLUOROSCOPIC GUIDANCE WHEN PERFORMED; W/BRONCHIAL ALVEOLAR LAVAGE;  Surgeon: Truett Mainland, MD;  Location: MAIN OR [place];  Service: Pulmonary    PR BRONCHOSCOPY,PLACEMENT FIDUCIAL MARKERS, 1/MULT N/A 11/21/2021    Procedure: BRONCHOSCOPY, RIGID OR FLEXIBLE, FLOURO WHEN PERFORMED; PLACEMENT OF FIDUCIAL MARKERS, SINGLE OR MULTIPLE;  Surgeon: Jerelyn Charles, MD;  Location: MAIN OR [place];  Service: Pulmonary    PR BRONCHOSCOPY,TRANSBRON ASPIR BX Right 11/21/2021    Procedure: BRONCHOSCOPY, RIGID/FLEX, INCL FLUORO; W/TRANSBRONCH NDL ASPIRAT BX, TRACHEA, MAIN STEM &/OR LOBAR BRONCHUS;  Surgeon: Jerelyn Charles, MD;  Location: MAIN OR [place];  Service: Pulmonary    PR BRONCHOSCOPY,TRANSBRONCH BIOPSY Right 11/21/2021    Procedure: BRONCHOSCOPY, RIGID/FLEXIBLE, INCLUDE FLUORO GUIDANCE WHEN PERFORMED; W/TRANSBRONCHIAL LUNG BX, SINGLE LOBE;  Surgeon: Jerelyn Charles, MD;  Location: MAIN OR [place];  Service: Pulmonary    PR C-LAMINOPLASTY W/GRAFT/PLATE, 2 OR MORE N/A 11/06/2019    Procedure: LAMINOPLASTY, CERVICAL, DECOMPRESS SPINAL CORD, 2+ VERTEBRAL SEGMENTS; W/RECONSTRUCT POSTERIOR BONY ELEMENT;  Surgeon: Nemiah Commander, MD;  Location: [facility] OR [facility];  Service: Orthopedics    PR COLONOSCOPY W/BIOPSY SINGLE/MULTIPLE N/A 07/05/2017    Procedure: COLONOSCOPY, FLEXIBLE, PROXIMAL TO SPLENIC FLEXURE; WITH BIOPSY, SINGLE OR MULTIPLE;  Surgeon: Rona Ravens, MD;  [facility];  Service: Gastroenterology    PR IONM 1 ON 1 IN OR W/ATTENDANCE EACH 15 MINUTES N/A 11/06/2019    Procedure: CONTINUOUS INTRAOPERATIVE NEUROPHYSIOLOGY MONITORING IN OR;  Surgeon: Nemiah Commander, MD;  Location: [facility] OR [facility];  Service: Orthopedics    PR LAM W/O FACETEC FORAMOT/DSKC 1/2 VRT SEG, CERVICAL N/A 11/06/2019    Procedure: LAMINECT W/EXPLOR WO FACETECT 1-2 VERTEB; CERV;  Surgeon: Nemiah Commander, MD;  Location: [facility] OR [facility];  Service: Orthopedics    PR POSTERIOR SEGMENTAL INSTRUMENTATION 3-6 VRT SEG N/A 11/06/2019    Procedure: POST SEGMT INSTRUM; 3 TO 6 VERTEB SEGMT CERVICAL;  Surgeon: Nemiah Commander, MD;  Location: [facility] OR [facility];  Service: Orthopedics    SINUS SURGERY         Other History:  The social history and family history were personally reviewed and updated in the patient's electronic medical record.    Family History   Problem Relation Age of Onset    Cancer Daughter     Strabismus Daughter     Hypertension Daughter     No Known Problems Mother     No Known Problems Father  No Known Problems Sister     No Known Problems Brother     No Known Problems Maternal Aunt     No Known Problems Maternal Uncle     No Known Problems Paternal Aunt     No Known Problems Paternal Uncle     No Known Problems Maternal Grandmother     No Known Problems Maternal Grandfather     No Known Problems Paternal Grandmother     No Known Problems Paternal Grandfather     Amblyopia Neg Hx     Blindness Neg Hx     Cataracts Neg Hx     Diabetes Neg Hx     Glaucoma Neg Hx     Macular degeneration Neg Hx     Retinal detachment Neg Hx     Stroke Neg Hx     Thyroid disease Neg Hx      Social History     Socioeconomic History    Marital status: Widowed   Tobacco Use    Smoking status: Former     Current packs/day: 0.00     Average packs/day: 1 pack/day for 35.0 years (35.0 ttl pk-yrs)     Types: Cigarettes     Start date: 02/19/1953     Quit date: 08/12/1987     Years since quitting: 35.2    Smokeless tobacco: Current     Types: Chew   Vaping Use    Vaping status: Never Used   Substance and Sexual Activity    Alcohol use: No     Alcohol/week: 0.0 standard drinks of alcohol    Drug use: No   Social History Narrative    Lives w/ two granddaughters (2 and 8 yrs old). Is a widower. Was married 29yrs. Used to work in [employer]. His daughter buys his groceries but he cooks and pays bills. Former smoker, smoked for 35 yrs, quit 20 yrs ago. Has been cheewing tobacco since.     Social Determinants of Health     Financial Resource Strain: Low Risk  (06/28/2022)    Overall Financial Resource Strain (CARDIA)     Difficulty of Paying Living Expenses: Not hard at all   Food Insecurity: No Food Insecurity (06/28/2022)    Hunger Vital Sign     Worried About Running Out of Food in the Last Year: Never true     Ran Out of Food in the Last Year: Never true   Transportation Needs: No Transportation Needs (06/28/2022)    PRAPARE - Therapist, art (Medical): No     Lack of Transportation (Non-Medical): No       Home Medications:  Current Outpatient Medications on File Prior to Visit   Medication Sig Dispense Refill    albuterol 2.5 mg /3 mL (0.083 %) nebulizer solution Inhale 3 mL (2.5 mg total) by nebulization every four (4) hours as needed for wheezing or shortness of breath. 60 mL 2    albuterol HFA 90 mcg/actuation inhaler TAKE 2 PUFFS BY MOUTH EVERY 6 HOURS AS NEEDED FOR WHEEZE 8 g 5    atorvastatin (LIPITOR) 40 MG tablet TAKE 1 TABLET BY MOUTH EVERY DAY IN THE EVENING 90 tablet 3    blood sugar diagnostic (GLUCOSE BLOOD) Strp Disp test strips preferred by insurance plan. Testing qday, Dx: E11.9 (Type 2 DM- controlled) 50 strip 11    blood-glucose meter kit Disp. blood glucose meter kit preferred by patient's insurance. Dx: Diabetes, E11.9 1 each 11  clotrimazole (LOTRIMIN) 1 % cream Apply topically two (2) times a day. 60 g 1    dupilumab 300 mg/2 mL PnIj Inject the contents of 1 pen (300 mg) under the skin every fourteen (14) days. 4 mL 11    empty container Misc Use as directed to dispose of Dupixent pens 1 each 2    EPINEPHrine (EPIPEN) 0.3 mg/0.3 mL injection Inject 0.3 mL (0.3 mg total) into the muscle once as needed for anaphylaxis for up to 1 dose. 1 each 0    finasteride (PROSCAR) 5 mg tablet Take 1 tablet (5 mg total) by mouth daily. 90 tablet 3    fluticasone propionate (FLONASE) 50 mcg/actuation nasal spray 2 sprays into each nostril daily. 48 mL 7    ipratropium-albuterol (DUO-NEB) 0.5-2.5 mg/3 mL nebulizer Inhale 1 vial ( 3 mL) by nebulization every six (6) hours. Use as directed 1080 mL 1    lancets Misc Disp. lancets #100 or amount allowed, Testing Qday. Dx: E11.9 (Diabetes- controlled) 100 each 11    lisinopril (PRINIVIL,ZESTRIL) 10 MG tablet Take 1 tablet (10 mg total) by mouth daily. 90 tablet 3    MEDICAL SUPPLY ITEM Pt has DM hx of neuropathy w/ callus. Followed for comprehensive care. For med records, fax release to [phone]. 2 Units 0    MEDICAL SUPPLY ITEM 1 pair of diabetic shoes and 3 pairs of inserts.  Pt has DM with neuropathy w/ callus. Followed for comprehensive care. For med records, fax release to [phone] 2 each 0    metFORMIN (GLUCOPHAGE) 500 MG tablet TAKE 1 TABLET (500MG  TOTAL) BY MOUTH IN THE MORNING AND TAKE 1 TABLET IN THE EVENING WITH MEALS 180 tablet 3    NIFEdipine (PROCARDIA XL) 30 MG 24 hr tablet Take 1 tablet (30 mg total) by mouth daily. 90 tablet 3    [START ON 12/11/2022] oxyCODONE-acetaminophen (PERCOCET) 5-325 mg per tablet Take 1 tablet by mouth every four (4) hours as needed for pain. 150 tablet 0    oxyCODONE-acetaminophen (PERCOCET) 5-325 mg per tablet Take 1 tablet by mouth every four (4) hours as needed for pain. 150 tablet 0    oxyCODONE-acetaminophen (PERCOCET) 5-325 mg per tablet Take 1 tablet by mouth every four (4) hours as needed for pain. 150 tablet 0    sodium chloride 3 % NEBULIZER solution Inhale 1 vial ( 4 mL) by nebulization two (2) times a day. 720 mL 3    tamsulosin (FLOMAX) 0.4 mg capsule TAKE 1 CAPSULE BY MOUTH EVERY DAY 90 capsule 3    TRELEGY ELLIPTA 200-62.5-25 mcg DsDv INHALE 1 PUFF IN THE MORNING 60 each 5    [EXPIRED] SENNA 8.6 mg tablet Take 1 tablet by mouth daily. 30 tablet 11     No current facility-administered medications on file prior to visit.       Allergies:  Allergies as of 11/15/2022 - Reviewed 11/15/2022   Allergen Reaction Noted    Latex  06/26/2022       Review of Systems:  A comprehensive review of systems was completed and negative except as noted in HPI.    PHYSICAL EXAM:   BP 130/71 (BP Site: L Arm, BP Position: Sitting, BP Cuff Size: Small) Comment: HAS NOT HAD BREAKAFST TOAY JUSTA MILKSHAKE - Pulse 102  - SpO2 98%       Gen: Patient is an elderly male, accompanied by his granddaughter, on 2 L oxygen awake, alert, oriented to time place and person, no pallor, icterus,  cyanosis,  mild clubbing.  Eyes: Pupils equal round and reacting light bilaterally  Head neck ENT: No JVD, no lymphadenopathy-cervical or supraclavicular, no thyromegaly, moist mucosa, normal oropharynx  CVS: S1-S2 heard normally, no murmurs appreciated, no rubs or gallops  Respiratory: Air entry reduced bilaterally with coarse breath sounds at the right base  Abdomen: Soft, nontender, nondistended, no organomegaly, bowel sounds present  Neuro: Nonfocal neurological exam, no sensory deficits, cranial nerves grossly intact  Skin extremities: No rash, no pedal edema     LABORATORY and RADIOLOGY DATA:     Pulmonary Function Tests/Interpretation:      7/5/024:            Pertinent Laboratory Data:     Latest Reference Range & Units 08/23/22 12:29   Aspergillus fumigatus IgE <0.35 kUA/L 1.26 (H)   Fusarium Proliferatum IgE <0.35 kUA/L 0.37 (H)   German Cockroach IgE <0.35 kUA/L 0.41 (H)   P chrysogenum (P notatum) IgE <0.35 kUA/L 0.53 (H)   Trichophyton rubrum IgE <0.35 kUA/L 2.69 (H)   Total IgE 3 - 48 IU/mL 787 (H)   A. fumigatus IgE <0.35 kU/L 1.22 (H)   (H): Data is abnormally high     Latest Reference Range & Units 08/23/22 12:29   Absolute Eosinophils 0.0 - 0.5 10*9/L 0.3        Latest Reference Range & Units 09/15/22 10:08   FIO2 Arterial  Room Air   pH, Arterial 7.35 - 7.45  7.45   pCO2, Arterial 35.0 - 45.0 mm Hg 37.2   pO2, Arterial 80.0 - 110.0 mm Hg 60.2 (L)   HCO3 Art 22 - 27 mmol/L 26   Base Excess, Arterial -2.0 - 2.0  1.6   O2 Sat, Arterial 94.0 - 100.0 % 92.1 (L)   Specimen Source  Arterial, left radial   (L): Data is abnormally low      Lab Results   Component Value Date    WBC 8.7 08/23/2022    HGB 11.9 (L) 08/23/2022    HCT 35.4 (L) 08/23/2022    PLT 384 08/23/2022       Lab Results   Component Value Date    NA 137 08/23/2022    K 5.0 (H) 08/23/2022    CL 101 08/23/2022    CO2 30.2 08/23/2022    BUN 24 (H) 08/23/2022    CREATININE 1.37 (H) 08/23/2022    GLU 204 (H) 08/23/2022    CALCIUM 9.5 08/23/2022    MG 1.6 07/17/2022    PHOS 2.3 (L) 07/17/2022       Lab Results   Component Value Date    BILITOT 0.4 08/23/2022    PROT 6.5 08/23/2022    ALBUMIN 3.1 (L) 08/23/2022    ALT 10 08/23/2022    AST 16 08/23/2022    ALKPHOS 91 08/23/2022    GGT 19 01/11/2012       Lab Results   Component Value Date    INR 1.07 10/20/2019    APTT 35.1 10/20/2019      Latest Reference Range & Units 06/30/20 20:40 09/30/21 11:57 04/19/22 11:58   Absolute Eosinophils 0.0 - 0.5 10*9/L 0.4 0.3 0.2     AFB smear and culture: 4/16-17/24: negative (pleural and bronchial)  Bronchial and pleural fluid culture: no growth  Fungal: negative  BAL GAL <0.5  Fungitell <31  RPP negative    A. Pleural fluid , cytology:  - Negative for malignant cells  - Reactive mesothelial cells, macrophages and chronic inflammation  BAL, right middle lobe, cytology:  - No cytologically malignant cells seen  - Abundant macrophages and mixed inflammatory cells    Pertinent Imaging Data:    CT chest 10/26/2022:  Impression      1. No findings to suggest progression of neoplastic disease involving the thorax      2. No findings of infectious pneumonia or other acute pulmonary process      3. Notable chronic findings include:   - Advanced destructive centrilobular emphysema   - Calcified atherosclerotic disease within the coronary arterial distribution         Echo 06/16/22: Summary    1. The left ventricle is relatively small in size with upper normal wall  thickness.    2. The left ventricular systolic function is normal, LVEF is visually  estimated at > 55%.    3. The right ventricle is normal in size, with normal systolic function.       CT chest: 07/25/22:   Impression      --Persistent but improved right middle and lower lobe airspace disease likely related to radiation therapy with nonvisualization of previously treated lesions.      --Increased left lower lobe airspace disease which could be treatment related or secondary to superimposed infection.      --Interval decrease in size of right apical nodule.      --Small right pleural effusion.         CTA 06/25/22:   Impression      * No acute pulmonary emboli.   * Increased right middle and right lower lobe airspace disease which could be treatment related or secondary to superimposed pneumonia or aspiration given airway debris.   * Scattered new irregular nodular opacities in both lungs are likely infectious/inflammatory in etiology with neoplasm considered less likely. Attention on follow-up imaging is recommended.   * Increased size of small right pleural effusion.   * Advanced destructive emphysema.       CT 04/25/22:   Impression      1. New extensive consolidation in the lower lobe of the right lung like reflects radiation therapy related pneumonitis. Underlying treated lesion cannot be differentiated from the surrounding consolidation.      2. New single irregular nodular lesions in the apices of both lungs likely relate to benign change. However, consider CT imaging surveillance in 2-3 months to ensure resolution.      3. Calcified atherosclerotic disease within the coronary arterial distribution (heavy)       CT 11/10/21: Extensive centrilobular emphysema and mild bronchial wall thickening consistent with COPD. Postsurgical changes of left upper lobectomy. Stable 2.9 cm and 2.5 cm groundglass opacities within the emphysema in the right lower lobe (series 4, image 49, 42). Scattered calcified granuloma in the right upper and right lower lobes. Central airways are patent. No pleural effusion or pneumothorax.         ASSESSMENT and PLAN     TROTTER REMSON is a 77 y.o. male with history of COPD, hypertension, prior history of left upper lobe adenocarcinoma status post partial lobectomy followed by right lower lobe non-small cell lung cancer not pathologically confirmed status post radiation in November 2023, complicated by radiation pneumonitis status post receiving steroids in February 2024, history of GERD and CKD as well Is here for management and optimization of his COPD and radiation pneumonitis related bronchiectatic changes in the right lower lobe.    Overall, patient has advanced destructive emphysema on his CT scan, and clinically  he has COPD Gold stage IIE,  He is currently on 2-3 L oxygen based on his formal 6-minute evaluation for daytime oxygen.  He also uses the same amount of oxygen at nighttime.     Since last visit, patient notes that he is doing a lot better overall symptomatically with the airway clearance twice a day with saline, Trelegy Ellipta as well as the Dupixent shots.    I discussed with him the timing of these therapies and have given him detailed instructions today in clinic as well.  I have also given him sputum cups to go home with, since he produces whitish clear mucus in the morning, which can be dropped off at a [facility] near him.    He has tolerated Dupixent very well and has not noted any side effects from the same.  His daughter gives him the shot at home without any difficulty.         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  -Genotype for alpha-1 antitrypsin - MM  -I have also placed orders for lower respiratory culture and sputum culture and given them cups to go home with, which the daughter will drop off to [facility] once he starts coughing up more sputum with the saline.  -Overall, patient doing really well and therefore has had better ability to perform his day-to-day activities.  I will continue the same regimen as above and follow-up in 6 months.      Oxygen Requirement  -Patient is currently on 2 L oxygen at rest, on exertion and at night.      Chronic NIV:  -No hypercapnia noted on ABG    Chronic steroid and macrolide requirement:  -Currently the patient does not require either, will reassess at next visit    Health Maintenance:    Most Recent Immunizations   Administered Date(s) Administered    COVID-19 VAC,BIVALENT(18YR UP),PFIZER 12/07/2020    COVID-19 VACC,MRNA,(PFIZER)(PF) 12/10/2019    COVID-19 VACCINE,MRNA(MODERNA)(PF) 07/12/2020    INFLUENZA QUAD ADJUVANTED 32YR UP(FLUAD) 11/26/2020    INFLUENZA QUAD HIGH DOSE 32YRS+(FLUZONE) 11/04/2018    INFLUENZA TIV (TRI) PF (IM) 11/23/2009    Influenza Vaccine Quad(IM)6 MO-Adult(PF) 05/08/2022    Influenza Virus Vaccine, unspecified formulation 11/26/2019    PNEUMOCOCCAL POLYSACCHARIDE 23-VALENT 08/30/2012    Pneumococcal Conjugate 13-Valent 11/20/2013    Td (adult) unspecified formulation 07/17/2003    TdaP 02/23/2012    ZOSTAVAX - ZOSTER VACCINE, LIVE, SQ 02/19/2014       Smoking Cessation:  Patient is currently not smoking cigarettes but he is chewing tobacco for the last 30 years.  I counseled him that if it was only a matter of having something in his mouth as he did not want to do candy chewing because of diabetes, there are sugar-free candies available that he can suck on and try to quit chewing tobacco.  I told him that chewing tobacco is also very high risk for causing oral cancer, lung cancer as well as pancreatic and stomach cancer.    Pulmonary Rehab:  Currently patient is already enrolled in a rehab program and is feeling better about it.  However since his blood pressure increased, he had to discontinue rehab.  He then graduated from rehab and he is supposed to  be doing exercises at home.  I encouraged him to do them at home every day.     Lung Cancer Screening  The USPSTF recommends annual screening for lung cancer with low-dose computed tomography in adults ages 19 to 71 years who have a 20 pack-year smoking history and currently smoke or have quit within the past 15 years. Screening should be discontinued once a person has not smoked for 15 years or develops a health problem that substantially limits life expectancy or the ability or willingness to have curative lung surgery.    Mr.Mcfadden does not meet criteria for lung cancer screening according to the USPSTF guidelines as he already has lung cancer and is treated for it and will require surveillance CT scans, scan in August showed stability of his disease.  Dr. Edwyna Ready continues to follow his scans.        Diagnoses and all orders for this visit:    Bronchiectasis without complication (CMS-HCC)  -     Sputum Culture; Future  -     AFB culture; Future  -     Sputum Culture  -     AFB culture    Asthma-COPD overlap syndrome (CMS-HCC)    Eosinophilic asthma        Patient is 3% hypertonic saline/ nebulized bronchodilator and steroids and it is needed for their bronchiectasis/COPD    Plan of care was discussed with the patient who acknowledged understanding and is in agreement.    Patient will return to clinic in 6 months or sooner if needed.    ZO:XWRUE Kishor Ronnell Fresh[place], Alma Friendly, MD    Maia Plan, MD  Assistant Professor, Pulmonary and Critical Care  Pager: [phone]  November 15, 2022 11:09 AM         I am located on-site and the patient is located on-site for this visit.

## 2022-11-15 NOTE — Unmapped (Signed)
You were seen in clinic today by Dr Laurette Schimke the same treatment:    In the morning do the 3% saline nebulizaton, and then do the trelegy     In the evening do the 3% saline again    Gargle after trelegy use    Collect your sputum in the cup and drop it off at a [facility] near you    Continue the shots as well    Your scan from 15 days ago is better than before, and no new areas of cancer are noted    It does show significant emphysema that we already knew    We will call you with abnormal results if any labs or imaging ordered. We would encourage you to use MyChart.  Please call the clinic if you have any further questions or call 911 for emergency.  Thank you for giving me the opportunity to manage your health.    If a sleep study was ordered call [facility] [phone]     If sleep clinic referral was made, call [facility] SLEEP/Neurology clinic  [phone] to schedule this appointment.    If any imaging studies were ordered, if you are not contacted by the schedulers in 7-10 days, I would recommend calling [phone] to reach the imaging schedulers.    [facility] [phone]  or [phone]    [facility] CARE specialists: [phone]     Between appointments, you can reach [place] at these numbers:    For appointments or the Pulmonary Nurse: Call Selinda Eon, COPD Clinical nurse: [phone])     [phone]  Fax: [phone] / [phone]    For nurse line call [phone] and tell them you are my patient.    For urgent issues after hours:    Hospital Operator: [phone], ask for Pulmonary Fellow on call        I don't have a MyChart. Why should I get one?   - It's encrypted, so your information is secure  - It's a quick, easy way to contact the care team, manage appointments, see test results, and more!    How do I sign-up for MyChart?   - Download the MyChart app from the [employer] or [employer] and sign-up in the app  - Sign-up online at [email or web address]          Your COPD Action Plan      Usual activity and exercise level. I sometimes have trouble breathing that improves with my medications (rescue inhaler or nebulizer). Usual amounts of cough and phlegm/mucus. Sleeping well at night.    Action:   Take below medications  Avoid cigarette smoke, inhaled irritants, and sick contacts  Use oxygen (if prescribed)  Continue regular exercise/diet plan    Your scheduled COPD medication is:  Trelegy DPI (fluticasone/umeclidinium/vilanterol)    Your COPD medication to take as needed if you become short of breath is:  Albuterol MDI (ProAir, Proventil, Ventolin, Xopenex)         I have any of the following complaints:  My cough is more frequent or severe  I am more short of breath than usual  I have more or thicker/greener mucus    Action:  Call Harriett Sine in pulmonary office [phone]) to discuss possible treatment with antibiotics or steroids.    If your breathing is not improved after 2-3 days or your symptoms worsen, call Harriett Sine for a reassessment. If it the weekend or holiday, call the hospital  operator [phone]) and ask for the Pulmonary Fellow On Call        I have any of the following:   Very short of breath even at rest  Confusion or difficulty staying awake  Coughing up blood  Chest pain  New or worsening leg swelling   Unable to eat or take care of myself    Action:   CALL 911 or have someone drive you to the closest emergency department.  Bring your medicines with you  Use your rescue inhaler while seeking help every 2 hours if needed  Use oxygen (if prescribed)         Chronic Obstructive Pulmonary Disease (COPD): Care Instructions  Your Care Instructions     Chronic obstructive pulmonary disease (COPD) is a general term for a group of lung diseases, including emphysema and chronic bronchitis. People with COPD have decreased airflow in and out of the lungs, which makes it hard to breathe. The airways also can get clogged with thick mucus. Cigarette smoking is a major cause of COPD.  Although there is no cure for COPD, you can slow its progress. Following your treatment plan and taking care of yourself can help you feel better and live longer.  Follow-up care is a key part of your treatment and safety. Be sure to make and go to all appointments, and call your doctor if you are having problems. It???s also a good idea to know your test results and keep a list of the medicines you take.  How can you care for yourself at home?  Staying healthy  Do not smoke. This is the most important step you can take to prevent more damage to your lungs. If you need help quitting, talk to your doctor about stop-smoking programs and medicines. These can increase your chances of quitting for good.  Avoid colds and flu. Get a pneumococcal vaccine shot. If you have had one before, ask your doctor whether you need a second dose. Get the flu vaccine every fall. If you must be around people with colds or the flu, wash your hands often.  Avoid secondhand smoke, air pollution, and high altitudes. Also avoid cold, dry air and hot, humid air. Stay at home with your windows closed when air pollution is bad.  Medicines and oxygen therapy  Take your medicines exactly as prescribed. Call your doctor if you think you are having a problem with your medicine.  You may be taking medicines such as:  Bronchodilators. These help open your airways and make breathing easier. Bronchodilators are either short-acting (work for 6 to 9 hours) or long-acting (work for 24 hours). You inhale most bronchodilators, so they start to act quickly. Always carry your quick-relief inhaler with you in case you need it while you are away from home.  Corticosteroids (prednisone, budesonide). These reduce airway inflammation. They come in pill or inhaled form. You must take these medicines every day for them to work well.  A spacer may help you get more inhaled medicine to your lungs. Ask your doctor or pharmacist if a spacer is right for you. If it is, ask how to use it properly.  Do not take any vitamins, over-the-counter medicine, or herbal products without talking to your doctor first.  If your doctor prescribed antibiotics, take them as directed. Do not stop taking them just because you feel better. You need to take the full course of antibiotics.  Oxygen therapy boosts the amount of oxygen in your blood and helps  you breathe easier. Use the flow rate your doctor has recommended, and do not change it without talking to your doctor first.  Activity  Get regular exercise. Walking is an easy way to get exercise. Start out slowly, and walk a little more each day.  Pay attention to your breathing. You are exercising too hard if you cannot talk while you are exercising.  Take short rest breaks when doing household chores and other activities.  Learn breathing methods--such as breathing through pursed lips--to help you become less short of breath.  If your doctor has not set you up with a pulmonary rehabilitation program, talk to him or her about whether rehab is right for you. Rehab includes exercise programs, education about your disease and how to manage it, help with diet and other changes, and emotional support.  Diet  Eat regular, healthy meals. Use bronchodilators about 1 hour before you eat to make it easier to eat. Eat several small meals instead of three large ones. Drink beverages at the end of the meal. Avoid foods that are hard to chew.  Eat foods that contain fat and protein so that you do not lose weight and muscle mass. These foods include ice cream, pudding, cheese, eggs, and peanut butter.  Use less salt. Too much salt can cause you to retain fluids, which makes it harder to breathe. Do not add salt while you are cooking or at the table. Eat fewer processed foods and foods from restaurants, including fast foods. Use fresh or frozen foods instead of canned foods.  Mental health  Talk to your family, friends, or a therapist about your feelings. It is normal to feel frightened, angry, hopeless, helpless, and even guilty. Talking openly about bad feelings can help you cope. If these feelings last, talk to your doctor.  When should you call for help?  Call 911 anytime you think you may need emergency care. For example, call if:  You have severe trouble breathing.  Call your doctor now or seek immediate medical care if:  You have new or worse trouble breathing.  You cough up blood.  You have a fever.  Watch closely for changes in your health, and be sure to contact your doctor if:  You cough more deeply or more often, especially if you notice more mucus or a change in the color of your mucus.  You have new or worse swelling in your legs or belly.  You are not getting better as expected.  Where can you learn more?  Go to CurvePoint.com.pt  ?? 2006-2016 Healthwise, Inc[employer]. Care instructions adapted under license by [facility]. This care instruction is for use with your licensed healthcare professional. If you have questions about a medical condition or this instruction, always ask your healthcare professional. Healthwise, Incorporated disclaims any warranty or liability for your use of this information.  Content Version: 10.9.538570; Current as of: October 31, 2013     COPD Exacerbation Plan: Care Instructions     Your Care Instructions   If you have chronic obstructive pulmonary disease (COPD), your usual shortness of breath could suddenly get worse. You may start coughing more and have more mucus. This flare-up is called a COPD exacerbation (say [id number]-ZAS-[id number]-[id number]).   A lung infection or air pollution could set off an exacerbation. Sometimes it can happen after a quick change in temperature or being around chemicals.   Work with your doctor to make a plan for dealing with an exacerbation. You can better manage it  if you plan ahead. Follow-up care is a key part of your treatment and safety. Be sure to make and go to all appointments, and call your doctor if you are having problems. It's also a good idea to know your test results and keep a list of the medicines you take.     How can you care for yourself at home?   During an exacerbation   Do not panic if you start to have one. Quick treatment at home may help you prevent serious breathing problems. If you have a COPD exacerbation plan that you developed with your doctor, follow it.   Take your medicines exactly as your doctor tells you.   Use your inhaler as directed by your doctor. If your symptoms do not get better after you use your medicine, have someone take you to the emergency room. Call an ambulance if necessary.   With inhaled medicines, a spacer or a nebulizer may help you get more medicine to your lungs. Ask your doctor or pharmacist how to use them properly. Practice using the spacer in front of a mirror before you have an exacerbation. This may help you get the medicine into your lungs quickly.   If your doctor has given you steroid pills, take them as directed.   Your doctor may have given you a prescription for antibiotics, which you are to fill if you need it. Call your doctor if you use the prescription.   Talk to your doctor if you have any problems with your medicine.    Preventing an exacerbation   Do not smoke. This is the most important step you can take to prevent more damage to your lungs and prevent problems. If you already smoke, it is never too late to stop. If you need help quitting, talk to your doctor about stop-smoking programs and medicines. These can increase your chances of quitting for good.   Take your daily medicines as prescribed.   Avoid colds and flu.   Get a pneumococcal vaccine.   Get a flu vaccine each year, as soon as it is available. Ask those you live or work with to do the same, so they will not get the flu and infect you.   Try to stay away from people with colds or the flu.   Wash your hands often.  Avoid secondhand smoke; air pollution; cold, dry air; hot, humid air; and high altitudes. Stay at home with your windows closed when air pollution is bad.   Learn breathing techniques for COPD, such as breathing through pursed lips. These techniques can help you breathe easier during an exacerbation.    When should you call for help?   Call 911 anytime you think you may need emergency care. For example, call if:   You have severe trouble breathing.   You have severe chest pain.  Call your doctor now or seek immediate medical care if:   You have new or worse shortness of breath.   You develop new chest pain.   You are coughing more deeply or more often, especially if you notice more mucus or a change in the color of your mucus.   You cough up blood.   You have new or increased swelling in your legs or belly.   You have a fever.  Watch closely for changes in your health, and be sure to contact your doctor if:   You use your antibiotic prescription.   Your symptoms are getting worse.  Where can you learn more?   Go to https://myuncchart.org   Enter ([phone] in the search box to learn more about COPD Exacerbation Plan: Care Instructions.   ?? 2006-2016 Healthwise, Inc[employer]. Care instructions adapted under license by [facility]. This care instruction is for use with your licensed healthcare professional. If you have questions about a medical condition or this instruction, always ask your healthcare professional. Healthwise, Incorporated disclaims any warranty or liability for your use of this information.   Content Version: 10.9.538570; Current as of: October 31, 2013

## 2022-11-22 DIAGNOSIS — K219 Gastro-esophageal reflux disease without esophagitis: Principal | ICD-10-CM

## 2022-11-22 MED ORDER — OMEPRAZOLE 20 MG CAPSULE,DELAYED RELEASE
ORAL_CAPSULE | 3 refills | 0 days
Start: 2022-11-22 — End: ?

## 2022-11-23 MED ORDER — OMEPRAZOLE 20 MG CAPSULE,DELAYED RELEASE
ORAL_CAPSULE | 0 refills | 0 days | Status: CP
Start: 2022-11-23 — End: ?

## 2022-11-23 NOTE — Unmapped (Signed)
Refilled omeprazole, needs reevaluation before next refill.

## 2022-12-01 NOTE — Unmapped (Unsigned)
***  DRAFT***    Radiation Oncology Follow Up Visit Note    Patient Name: Lee Baxter  Patient Age: 77 y.o.  Encounter Date: 12/08/2022    Referring Physician:   Marshell Garfinkel, MD  [address]  [id number]  [place],  [place] [address]    Primary Care Provider:  Deeann Cree, MD    Diagnoses:  1. Malignant neoplasm of lower lobe of right lung (CMS-HCC)          Treatment Site: TWO RLL lesions, 1000cGy x 5 fractions = 5000cGy, completed 01/23/22    Interval Since Completion of Treatment: 9 months      Assessment: 77 y.o. 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     Interval History:  [name] returns today for a regularly scheduled follow-up, he was last seen in clinic in June.      He had been hospitalized from 4/14-4/18/24 for dyspnea and found to have hypoxemia suspected to be multifocal possibly related to RT pneumonitis (s/p steroids and antibiotics), aspiration pneumonia, COPD exacerbation. Uses 2 L [place].     He has had difficulty eating and is starting to supplement with boosts. Barium swallow study on 06/29/22 with esophageal dysmotility and tertiary contractions and delayed clearance of esophageal contents. Pending referral to GI with primary. Using famotidine as needed.    He is managed by pulm with airway clearance twice a day with saline, Trelegy Ellipta as well as the Dupixent shots     ***    Review of Systems: All other systems reviewed are negative. Pertinent positives and negatives are above in interval history.    Past Medical, Surgical, Family and Social Histories reviewed and updated in the electronic medical record.    Hematology/Oncology History    No history exists. Physical Exam:  Vital Signs for this encounter:  There were no vitals taken for this visit.  Last weight:    Wt Readings from Last 4 Encounters:   10/16/22 56.7 kg (125 lb)   09/21/22 56 kg (123 lb 8 oz)   09/15/22 55.3 kg (122 lb)   08/23/22 55.2 kg (121 lb 12.8 oz)     Karnofsky/Lansky Performance Status: 70, Cares for self; unable to carry on normal activity or to do active work (ECOG equivalent 1)  General:   No acute distress, alert and oriented X 4   HEENT:  Normocephalic and atraumatic.  Neck/lymphatics:  Supple.  Respiratory:  Breathing is non-labored.  Extremities:  No cyanosis or edema.  Neuro:   Alert and oriented x4.         Radiology  ***        Electronically signed by:  Sydnee Levans, PA-C  Department of Radiation Oncology  [facility]  [id number]  [place], [place] [address]  O: [phone]  12/01/22 11:09 AM

## 2022-12-08 DIAGNOSIS — C3431 Malignant neoplasm of lower lobe, right bronchus or lung: Principal | ICD-10-CM

## 2022-12-08 NOTE — Unmapped (Signed)
Patient was notified of operational disruptions. Patient opted to: schedule their refill with understanding of potential delay until 10/1 or later. This was facilitated by pharmacy staff     [facility] Refill Coordination Note    Specialty Medication(s) to be Shipped:   CF/Pulmonary/Asthma: DUPIXENT PEN 300 mg/2 mL Pnij (dupilumab)    Other medication(s) to be shipped: No additional medications requested for fill at this time     Lee Baxter, DOB: 04/15/1945  Phone: [phone] (home)       All above HIPAA information was verified with patient's family member, [name].     Was a Nurse, learning disability used for this call? No    Completed refill call assessment today to schedule patient's medication shipment from the [facility]  [phone]).  All relevant notes have been reviewed.     Specialty medication(s) and dose(s) confirmed: Regimen is correct and unchanged.   Changes to medications: [place] reports no changes at this time.  Changes to insurance: No  New side effects reported not previously addressed with a pharmacist or physician: None reported  Questions for the pharmacist: No    Confirmed patient received a Conservation officer, [name], mining with first shipment. The patient will receive a drug information handout for each medication shipped and additional FDA Medication Guides as required.       DISEASE/MEDICATION-SPECIFIC INFORMATION        For patients on injectable medications: Patient currently has 1 doses left.  Next injection is scheduled for 12/08/22.    SPECIALTY MEDICATION ADHERENCE     Medication Adherence    Patient reported X missed doses in the last month: 0  Specialty Medication: DUPIXENT PEN 300 mg/2 mL Pnij (dupilumab)  Patient is on additional specialty medications: No  Patient is on more than two specialty medications: No  Any gaps in refill history greater than 2 weeks in the last 3 months: no  Demonstrates understanding of importance of adherence: yes  Informant: child/children  Reliability of informant: reliable  Provider-estimated medication adherence level: good  Patient is at risk for Non-Adherence: No  Reasons for non-adherence: no problems identified              Were doses missed due to medication being on hold? No    DUPIXENT PEN 300 mg/2 mL Pnij (dupilumab)   : 1 doses of medicine on hand       REFERRAL TO PHARMACIST     Referral to the pharmacist: Not needed      [facility]     Shipping address confirmed in Epic.       Delivery Scheduled: Yes, Expected medication delivery date: 12/13/22.     Medication will be delivered via UPS to the prescription address in Epic WAM.    [facility]  Specialty Technician

## 2022-12-11 MED ORDER — OXYCODONE-ACETAMINOPHEN 5 MG-325 MG TABLET
ORAL | 0 refills | 25 days | Status: CP | PRN
Start: 2022-12-11 — End: ?

## 2022-12-12 MED FILL — DUPIXENT 300 MG/2 ML SUBCUTANEOUS PEN INJECTOR: SUBCUTANEOUS | 28 days supply | Qty: 4 | Fill #3

## 2022-12-27 DIAGNOSIS — K219 Gastro-esophageal reflux disease without esophagitis: Principal | ICD-10-CM

## 2022-12-27 MED ORDER — OMEPRAZOLE 20 MG CAPSULE,DELAYED RELEASE
ORAL_CAPSULE | 1 refills | 0 days
Start: 2022-12-27 — End: ?

## 2022-12-28 MED ORDER — OMEPRAZOLE 20 MG CAPSULE,DELAYED RELEASE
ORAL_CAPSULE | 1 refills | 0 days
Start: 2022-12-28 — End: ?

## 2022-12-28 NOTE — Unmapped (Signed)
Last ordered: 1 month ago (11/23/2022) by Deeann Cree, MD   With 1 refill/ 90 caps

## 2023-01-08 NOTE — Unmapped (Signed)
[facility] and [facility] Delivery Pharmacy Refill Coordination Note    Specialty Medication(s) to be Shipped:   Inflammatory Disorders: Dupixent    Other medication(s) to be shipped: No additional medications requested for fill at this time     Lee Baxter, DOB: Aug 14, 1945  Phone: [phone] (home) [phone] (work)      All above HIPAA information was verified with patient.     Was a Nurse, learning disability used for this call? No    Completed refill call assessment today to schedule patient's medication shipment from the [facility] Delivery Pharm[facility]  [phone]).  All relevant notes have been reviewed.     Specialty medication(s) and dose(s) confirmed: Regimen is correct and unchanged.   Changes to medications: [name] reports no changes at this time.  Changes to insurance: No  New side effects reported not previously addressed with a pharmacist or physician: None reported  Questions for the pharmacist: No    Confirmed patient received a Conservation officer, [name], mining with first shipment. The patient will receive a drug information handout for each medication shipped and additional FDA Medication Guides as required.       DISEASE/MEDICATION-SPECIFIC INFORMATION        For patients on injectable medications: Patient currently has 0 doses left.  Next injection is scheduled for 01/19/23.    SPECIALTY MEDICATION ADHERENCE     Medication Adherence    Patient reported X missed doses in the last month: 0  Specialty Medication: DUPIXENT PEN 300 mg/2 mL Pnij (dupilumab)  Patient is on additional specialty medications: No              Were doses missed due to medication being on hold? No    Dupixent 300/2 mg/ml: 0 doses of medicine on hand     REFERRAL TO PHARMACIST     Referral to the pharmacist: Not needed      [name]     Shipping address confirmed in Epic.       Delivery Scheduled: Yes, Expected medication delivery date: 01/17/23.     Medication will be delivered via UPS to the prescription address in Epic WAM.    [name]   [facility] Delivery Pharm[facility]  Specialty Technician

## 2023-01-12 NOTE — Unmapped (Signed)
Patients daughter left a voicemail requesting a refill of the patients Percocet. She states that the will run out of medication on 01/14/23 and is next appointment isn't until 01/22/23. She would like this refilled at [facility].

## 2023-01-16 DIAGNOSIS — M545 Chronic midline low back pain without sciatica: Principal | ICD-10-CM

## 2023-01-16 DIAGNOSIS — G8929 Other chronic pain: Principal | ICD-10-CM

## 2023-01-16 DIAGNOSIS — M5090 Cervical disc disorder, unspecified, unspecified cervical region: Principal | ICD-10-CM

## 2023-01-16 MED ORDER — OXYCODONE-ACETAMINOPHEN 5 MG-325 MG TABLET
ORAL_TABLET | ORAL | 0 refills | 25 days | Status: CP | PRN
Start: 2023-01-16 — End: ?

## 2023-01-16 MED FILL — DUPIXENT 300 MG/2 ML SUBCUTANEOUS PEN INJECTOR: SUBCUTANEOUS | 28 days supply | Qty: 4 | Fill #4

## 2023-01-16 NOTE — Unmapped (Signed)
Refilled oxycodone-acetaminophen 5-325, #150, no ref.PDMP reviewed.

## 2023-01-16 NOTE — Unmapped (Signed)
Request med refill on pain med.  Out 01/14/2023 and has appt on 01/22/2023

## 2023-01-22 ENCOUNTER — Ambulatory Visit: Admit: 2023-01-22 | Discharge: 2023-01-23 | Payer: MEDICARE | Attending: Internal Medicine | Primary: Internal Medicine

## 2023-01-22 DIAGNOSIS — E119 Type 2 diabetes mellitus without complications: Principal | ICD-10-CM

## 2023-01-22 DIAGNOSIS — M545 Chronic midline low back pain without sciatica: Principal | ICD-10-CM

## 2023-01-22 DIAGNOSIS — G8929 Other chronic pain: Principal | ICD-10-CM

## 2023-01-22 DIAGNOSIS — I10 Essential (primary) hypertension: Principal | ICD-10-CM

## 2023-01-22 DIAGNOSIS — Z23 Encounter for immunization: Principal | ICD-10-CM

## 2023-01-22 MED ORDER — DIPHTH,PERTUSSIS(ACEL),TETANUS 2.5 LF UNIT-8 MCG-5 LF/0.5ML IM SYRINGE
Freq: Once | INTRAMUSCULAR | 0 refills | 1 days | Status: CP
Start: 2023-01-22 — End: 2023-01-22

## 2023-01-22 NOTE — Unmapped (Addendum)
Glad to see that you are doing and feeling better.  Obtain your Tdap vaccine at the pharmacy, along with the new RSV vaccine on the same day.  Schedule your appointment.  Also that over today that okay let me know if you have any problems or questions.

## 2023-01-22 NOTE — Unmapped (Signed)
 Internal Medicine at [facility]     Reason for visit: Follow up    Questions / Concerns that need to be addressed:     Screening BP- 130/75      PTHomeBP         HCDM reviewed and updated in Epic:    We are working to make sure all of our patients??? wishes are updated in Epic and part of that is documenting a Environmental health practitioner for each patient  A Health Care Decision Maker is someone you choose who can make health care decisions for you if you are not able - who would you most want to do this for you????  is already up to date.    HCDM (patient stated preference): Lee Baxter - Daughter - [phone]    BPAs completed:  COPD - MMRC symptom assessment and Diabetes - urine albumin/creatinine ratio    Annual Screenings:     __________________________________________________________________________________________    SCREENINGS COMPLETED IN FLOWSHEETS      AUDIT       PHQ2       PHQ9          GAD7       COPD Assessment       Falls Risk

## 2023-01-23 NOTE — Unmapped (Unsigned)
Called mobile number listed in chart which went to his daughter, as patient's appt was scheduled for 1:45 and had not arrived by 2:17.  Patient's daughter noted that he would not come to clinic as he had to wait too long to be seen last time.  I will send patient a note via mail with respect to findings and recommendation for repeat CT in 3 months. history.    Past Medical, Surgical, Family and Social Histories reviewed and updated in the electronic medical record.    Hematology/Oncology History    No history exists.         Physical Exam:  Vital Signs for this encounter:  There were no vitals taken for this visit.  Last weight:    Wt Readings from Last 4 Encounters:   01/22/23 57.6 kg (127 lb)   10/16/22 56.7 kg (125 lb)   09/21/22 56 kg (123 lb 8 oz)   09/15/22 55.3 kg (122 lb)     Karnofsky/Lansky Performance Status: 70, Cares for self; unable to carry on normal activity or to do active work (ECOG equivalent 1)  General:   No acute distress, alert and oriented X 4   HEENT:  Normocephalic and atraumatic.  Neck/lymphatics:  Supple.  Respiratory:  Breathing is non-labored.  Extremities:  No cyanosis or edema.  Neuro:   Alert and oriented x4.         Radiology  ***    Electronically signed by:  [name]

## 2023-01-23 NOTE — Unmapped (Signed)
HISTORY: [name] is a 77 year old gentleman with a history of diabetes, hypertension, remote history of lung cancer status post left partial lobectomy, and in 12/2021 with radiographic diagnosis of synchronous RLL NSCLC 2.9 and 2.5 cm lesions without pathologic confirmation (nondiagnostic) s/p RT 2023, stage 3a CKD, newly diagnosed asthma-COPD overlap on oxygen 3 L/min who presents for follow-up.     Since his last visit, the patient continues to improve and he no longer needs to use his oxygen for the last 2 weeks.  He checks his pulse oximetry and reports O2 saturation 93-95%.  He is taking all his medications.  He is also on dipulimab every 14 days.  He has a little cough with morning phlegm that is mostly clear and sometimes is pale green.  Recently saw his pulmonologist Dr. Macie Burows on 11/15/2022 who continued his current regimen.    Patient states he is eating well and still has occasional trouble with some food not going down but after drinking some water, he is able to swallow the food.  No regurgitation, no vomiting.    Regarding his hypertension, he reports that today's blood pressure was 130s/83 at home.  He is taking the lisinopril 10 mg and nifedipine XL 30 mg that was prescribed at the last visit and his blood pressure has been much better controlled.    For his chronic pain, he feels he still has some pain at the base of his neck where he had the prior neck surgery.  He also has pain in his entire thoracic and lumbar spine but no sciatica reported.  He has not fallen since the last visit.  He still takes oxycodone-acetaminophen 5-3 25, four pills and sometimes 5 pills a day which helps control his pain.  Prior duloxetine 20 mg did not help and he felt sick and sleepy with the medication that was stopped in 2020.    Regarding his diabetes, he reports that today his blood glucose was 160 this morning but he thinks it is slightly higher because he had a lot of sweets yesterday.  He is taking metformin 500 mg twice a day.Last hemoglobin A1c was 7.4% on 06/25/2022.    Patient Active Problem List   Diagnosis    Primary hypertension    Incisional hernia    Hyperlipidemia    Sensorineural hearing loss    Back pain    Allergic rhinitis    Adenomatous colon polyp    COPD, severe (CMS-HCC)    Tobacco abuse    Chewing tobacco use    GERD without esophagitis    History of lung cancer    Diabetes mellitus type 2 without retinopathy (CMS-HCC)    Dry eye syndrome of bilateral lacrimal glands    Pseudophakia of both eyes    Myopia with astigmatism and presbyopia, bilateral    Stage 3a chronic kidney disease (CMS-HCC)    Tinnitus of both ears    Lung cancer (CMS-HCC)    Radiation pneumonitis (CMS-HCC)    UTI (urinary tract infection)    Pleural effusion, right    Hypoxemia    Hypokalemia    Hypomagnesemia    Hypophosphatemia    Aspiration pneumonitis (CMS-HCC)    COPD exacerbation (CMS-HCC)      Reviewed and reflects modifications made at the end of the visit.  Current Outpatient Medications   Medication Sig Dispense Refill    albuterol 2.5 mg /3 mL (0.083 %) nebulizer solution Inhale 3 mL (2.5 mg total) by nebulization every  four (4) hours as needed for wheezing or shortness of breath. 60 mL 2    albuterol HFA 90 mcg/actuation inhaler TAKE 2 PUFFS BY MOUTH EVERY 6 HOURS AS NEEDED FOR WHEEZE 8 g 5    atorvastatin (LIPITOR) 40 MG tablet TAKE 1 TABLET BY MOUTH EVERY DAY IN THE EVENING 90 tablet 3    blood sugar diagnostic (GLUCOSE BLOOD) Strp Disp test strips preferred by insurance plan. Testing qday, Dx: E11.9 (Type 2 DM- controlled) 50 strip 11    blood-glucose meter kit Disp. blood glucose meter kit preferred by patient's insurance. Dx: Diabetes, E11.9 1 each 11    clotrimazole (LOTRIMIN) 1 % cream Apply topically two (2) times a day. 60 g 1    dupilumab 300 mg/2 mL PnIj Inject the contents of 1 pen (300 mg) under the skin every fourteen (14) days. 4 mL 11    empty container Misc Use as directed to dispose of Dupixent pens 1 each 2    finasteride (PROSCAR) 5 mg tablet Take 1 tablet (5 mg total) by mouth daily. 90 tablet 3    metFORMIN (GLUCOPHAGE) 500 MG tablet TAKE 1 TABLET (500MG  TOTAL) BY MOUTH IN THE MORNING AND TAKE 1 TABLET IN THE EVENING WITH MEALS 180 tablet 3    diph,pertuss,acel,,tetanus vaccine-Tdap (BOOSTRIX) 2.5-8-5 Lf-mcg-Lf/0.39mL injection Inject 0.5 mL into the muscle once for 1 dose. 0.5 mL 0    EPINEPHrine (EPIPEN) 0.3 mg/0.3 mL injection Inject 0.3 mL (0.3 mg total) into the muscle once as needed for anaphylaxis for up to 1 dose. (Patient not taking: Reported on 01/22/2023) 1 each 0    fluticasone propionate (FLONASE) 50 mcg/actuation nasal spray 2 sprays into each nostril daily. 48 mL 7    ipratropium-albuterol (DUO-NEB) 0.5-2.5 mg/3 mL nebulizer Inhale 1 vial ( 3 mL) by nebulization every six (6) hours. Use as directed 1080 mL 1    lancets Misc Disp. lancets #100 or amount allowed, Testing Qday. Dx: E11.9 (Diabetes- controlled) 100 each 11    lisinopril (PRINIVIL,ZESTRIL) 10 MG tablet Take 1 tablet (10 mg total) by mouth daily. 90 tablet 3    MEDICAL SUPPLY ITEM Pt has DM hx of neuropathy w/ callus. Followed for comprehensive care. For med records, fax release to [phone]. 2 Units 0    MEDICAL SUPPLY ITEM 1 pair of diabetic shoes and 3 pairs of inserts.  Pt has DM with neuropathy w/ callus. Followed for comprehensive care. For med records, fax release to [phone] 2 each 0    NIFEdipine (PROCARDIA XL) 30 MG 24 hr tablet Take 1 tablet (30 mg total) by mouth daily. 90 tablet 3    omeprazole (PRILOSEC) 20 MG capsule TAKE 1 CAPSULE BY MOUTH EVERY DAY 30 capsule 0    oxyCODONE-acetaminophen (PERCOCET) 5-325 mg per tablet Take 1 tablet by mouth every four (4) hours as needed for pain. 150 tablet 0    [START ON 02/15/2023] oxyCODONE-acetaminophen (PERCOCET) 5-325 mg per tablet Take 1 tablet by mouth every four (4) hours as needed for pain. 150 tablet 0    [START ON 03/18/2023] oxyCODONE-acetaminophen (PERCOCET) 5-325 mg per tablet Take 1 tablet by mouth every four (4) hours as needed for pain. 150 tablet 0    [START ON 04/16/2023] oxyCODONE-acetaminophen (PERCOCET) 5-325 mg per tablet Take 1 tablet by mouth every four (4) hours as needed for pain. 150 tablet 0    sodium chloride 3 % NEBULIZER solution Inhale 1 vial ( 4 mL) by nebulization two (2) times a day.  720 mL 3    tamsulosin (FLOMAX) 0.4 mg capsule TAKE 1 CAPSULE BY MOUTH EVERY DAY 90 capsule 3    TRELEGY ELLIPTA 200-62.5-25 mcg DsDv INHALE 1 PUFF IN THE MORNING 60 each 5     No current facility-administered medications for this visit.     Allergies   Allergen Reactions    Latex      Added based on information entered during case entry, please review and add reactions, type, and severity as needed        SOCIAL History: as above.    PHYSICAL:  General: Well-developed, well-groomed and dressed gentleman who is in no respiratory distress.  Vitals:   Vitals:    01/22/23 1141   BP: 130/75   Pulse: 106   Resp: 16   Temp: 35.8 ??C (96.5 ??F)   TempSrc: Temporal   SpO2: 98%   Weight: 57.6 kg (127 lb)   Height: 165.1 cm (5' 5)      BP Readings from Last 3 Encounters:   01/22/23 130/75   11/15/22 130/71   10/16/22 133/75     Wt Readings from Last 3 Encounters:   01/22/23 57.6 kg (127 lb)   10/16/22 56.7 kg (125 lb)   09/21/22 56 kg (123 lb 8 oz)       Chest:  Clear to auscultation bilaterally.  No crackles or wheezes.  CV: Normal S1 and S2, no S3 or murmur.   Ext:  No pedal edema.  Psychiatric: More animated, speaking fluently in full sentences without difficulty.  Smiling, joking, more like himself.      ASSESSMENT AND PLANS:    -- 1.  Type 2 diabetes mellitus, despite limited home BG data, patient seems to remain controlled.  He did not want repeat A1c today.  Continue metformin 500 mg twice a day.  He states he will see his own eye doctor very soon (December 2024) for regular checkup.  Ordered urine albumin creatinine ratio.    -- 2.  Primary hypertension, much better controlled with current regimen that he will continue.  This includes lisinopril 10 mg a day, nifedipine XL 30 mg a day.  Patient declines BMP.    -- 3.  Chronic low back pain, refilled oxycodone-acetaminophen 5-3 25 #150 for the first month, #150 for the second month, #150 for the third month.  Has filled duloxetine low-dose.  If pain worsens will assess for metastasis and if negative, consider venlafaxine.    -- 4.  Lung carcinoma, has done well with this therapy and recuperated from radiation pneumonitis.  He will follow-up with radiation oncology on 02/02/2023.    --5.  COPD/emphysema/eosinophilic asthma, much improved with all medicines that he should continue using.  Currently not requiring oxygen supplementation.  Provided flu vaccine today.  He declines COVID booster.  Recommended RSV at the local pharmacy as well as Tdap, the latter of which was prescribed to his pharmacy.    Follow-up with me in 3 months or as needed.    This note was dictated using voice recognition software.  Please forgive typos.

## 2023-02-02 ENCOUNTER — Ambulatory Visit: Admit: 2023-02-02 | Discharge: 2023-02-02 | Payer: MEDICARE

## 2023-02-13 DIAGNOSIS — C3431 Malignant neoplasm of lower lobe, right bronchus or lung: Principal | ICD-10-CM

## 2023-02-13 NOTE — Unmapped (Signed)
[facility] Specialty and Home Delivery Pharmacy Refill Coordination Note    Specialty Medication(s) to be Shipped:   CF/Pulmonary/Asthma: Dupixent 300mg /47ml    Other medication(s) to be shipped: No additional medications requested for fill at this time     Lee Baxter, DOB: 1945/10/22  Phone: [phone] (home) [phone] (work)      All above HIPAA information was verified with patient's family member, Yetta Glassman.     Was a Nurse, learning disability used for this call? No    Completed refill call assessment today to schedule patient's medication shipment from the [facility] and [facility] Delivery Pharmacy  [phone]).  All relevant notes have been reviewed.     Specialty medication(s) and dose(s) confirmed: Regimen is correct and unchanged.   Changes to medications: Jalien reports no changes at this time.  Changes to insurance: No  New side effects reported not previously addressed with a pharmacist or physician: None reported  Questions for the pharmacist: No    Confirmed patient received a Conservation officer, [name], mining with first shipment. The patient will receive a drug information handout for each medication shipped and additional FDA Medication Guides as required.       DISEASE/MEDICATION-SPECIFIC INFORMATION        For patients on injectable medications: Patient currently has 0 doses left.  Next injection is scheduled for 02/23/23.    SPECIALTY MEDICATION ADHERENCE     Medication Adherence    Patient reported X missed doses in the last month: 0  Specialty Medication: DUPIXENT PEN 300 mg/2 mL Pnij (dupilumab)  Patient is on additional specialty medications: No  Patient is on more than two specialty medications: No  Any gaps in refill history greater than 2 weeks in the last 3 months: no  Demonstrates understanding of importance of adherence: yes              Were doses missed due to medication being on hold? No    DUPIXENT PEN 300   mg/ml: 0 days of medicine on hand       REFERRAL TO PHARMACIST Referral to the pharmacist: Not needed      [facility]     Shipping address confirmed in Epic.       Delivery Scheduled: Yes, Expected medication delivery date: 02/20/23.     Medication will be delivered via UPS to the prescription address in Epic WAM.    Moshe Salisbury   [facility] Special[facility] and Home Delivery Pharmacy  Specialty Technician

## 2023-02-15 MED ORDER — OXYCODONE-ACETAMINOPHEN 5 MG-325 MG TABLET
ORAL_TABLET | ORAL | 0 refills | 25 days | Status: CP | PRN
Start: 2023-02-15 — End: 2023-03-17

## 2023-02-19 MED FILL — DUPIXENT 300 MG/2 ML SUBCUTANEOUS PEN INJECTOR: SUBCUTANEOUS | 28 days supply | Qty: 4 | Fill #5

## 2023-02-21 NOTE — Unmapped (Signed)
RADIATION ONCOLOGY TREATMENT COMPLETION NOTE    Encounter Date: 01/23/2022  Patient Name: Lee Baxter  Medical Record Number: [id number]    DIAGNOSIS: Lung cancer    TREATMENT INTENT: curative    CLINICAL TRIAL: no    CHEMOTHERAPY: not administered    RADIATION TREATMENT SUMMARY:    Treatment site Treatment Technique/Modality Energy Dose per fraction Total number  of fractions Total dose Start date End date   2 RLL lesions Cyberknife Xrays 1000 cGy 5 5000 cGy 01/13/22 01/23/22               TOLERANCE TO TREATMENT:  No toxicities or complications    TREATMENT BREAKS:  no    PLAN FOR FOLLOW-UP: Follow up in 3 months with chest CT    ----------------------------------------------------------------------------------------------------------------------  February 21, 2023 1:32 PM. Documentation assistance provided by Hetty Blend, medical scribe, at the direction of Marshell Garfinkel, MD.  ----------------------------------------------------------------------------------------------------------------------     Garey Ham. Wyline Mood, MD, PhD  Assistant Professor  Department of Radiation Oncology  [facility]  [id number]  [place], [place] [address]  02/21/23 4:48 PM

## 2023-03-18 MED ORDER — OXYCODONE-ACETAMINOPHEN 5 MG-325 MG TABLET
ORAL_TABLET | ORAL | 0 refills | 25 days | Status: CP | PRN
Start: 2023-03-18 — End: 2023-04-17

## 2023-03-19 NOTE — Unmapped (Signed)
[facility] Specialty and Home Delivery Pharmacy Refill Coordination Note    Specialty Medication(s) to be Shipped:   CF/Pulmonary/Asthma: Dupixent 300mg     Other medication(s) to be shipped: No additional medications requested for fill at this time     Lee Baxter, DOB: January 25, 1946  Phone: [phone] (home) [phone] (work)      All above HIPAA information was verified with patient's family member, daughter.     Was a Nurse, learning disability used for this call? No    Completed refill call assessment today to schedule patient's medication shipment from the [facility] and Home Delivery Pharmacy  [phone]).  All relevant notes have been reviewed.     Specialty medication(s) and dose(s) confirmed: Regimen is correct and unchanged.   Changes to medications: Lee Baxter reports no changes at this time.  Changes to insurance: No  New side effects reported not previously addressed with a pharmacist or physician: None reported  Questions for the pharmacist: No    Confirmed patient received a Conservation officer, [name], mining with first shipment. The patient will receive a drug information handout for each medication shipped and additional FDA Medication Guides as required.       DISEASE/MEDICATION-SPECIFIC INFORMATION        N/A    SPECIALTY MEDICATION ADHERENCE              Were doses missed due to medication being on hold? Yes - forgot on friday    Dupixent 300 mg: 1 doses of medicine on hand       REFERRAL TO PHARMACIST     Referral to the pharmacist: Not needed      [facility]     Shipping address confirmed in Epic.       Delivery Scheduled: Yes, Expected medication delivery date: 1/8.     Medication will be delivered via UPS to the prescription address in Epic WAM.    [name], PharmD   [facility] Special[facility] and Home Delivery Pharmacy  Specialty Pharmacist

## 2023-03-20 DIAGNOSIS — J4550 Severe persistent asthma, uncomplicated: Principal | ICD-10-CM

## 2023-03-20 DIAGNOSIS — J8283 Eosinophilic asthma: Principal | ICD-10-CM

## 2023-03-20 DIAGNOSIS — J4489 Asthma-chronic obstructive pulmonary disease overlap syndrome (CMS-HCC): Principal | ICD-10-CM

## 2023-03-21 MED FILL — DUPIXENT 300 MG/2 ML SUBCUTANEOUS PEN INJECTOR: SUBCUTANEOUS | 28 days supply | Qty: 4 | Fill #6

## 2023-03-21 NOTE — Unmapped (Signed)
 Separate encounter created

## 2023-03-30 MED ORDER — ATORVASTATIN 40 MG TABLET
ORAL_TABLET | Freq: Every evening | ORAL | 3 refills | 90 days | Status: CP
Start: 2023-03-30 — End: ?

## 2023-04-16 MED ORDER — OXYCODONE-ACETAMINOPHEN 5 MG-325 MG TABLET
ORAL_TABLET | ORAL | 0 refills | 25 days | Status: CP | PRN
Start: 2023-04-16 — End: 2023-05-16

## 2023-04-21 NOTE — Unmapped (Signed)
[facility] Specialty and Home Delivery Pharmacy Refill Coordination Note    Specialty Medication(s) to be Shipped:   CF/Pulmonary/Asthma: Dupixent 300mg /22ml    Other medication(s) to be shipped: No additional medications requested for fill at this time     Lee Baxter, DOB: 05/25/1945  Phone: [phone] (home) [phone] (work)      All above HIPAA information was verified with patient.     Was a Nurse, learning disability used for this call? No    Completed refill call assessment today to schedule patient's medication shipment from the [facility] and Home Delivery Pharmacy  [phone]).  All relevant notes have been reviewed.     Specialty medication(s) and dose(s) confirmed: Regimen is correct and unchanged.   Changes to medications: Lee Baxter reports no changes at this time.  Changes to insurance: No  New side effects reported not previously addressed with a pharmacist or physician: None reported  Questions for the pharmacist: No    Confirmed patient received a Conservation officer, [name], mining with first shipment. The patient will receive a drug information handout for each medication shipped and additional FDA Medication Guides as required.       DISEASE/MEDICATION-SPECIFIC INFORMATION        For patients on injectable medications: Patient currently has 0 doses left.  Next injection is scheduled for 05/04/23 .    SPECIALTY MEDICATION ADHERENCE     Medication Adherence    Patient reported X missed doses in the last month: 0  Specialty Medication: dupilumab: DUPIXENT PEN 300 mg/2 mL Pnij  Patient is on additional specialty medications: No  Patient is on more than two specialty medications: No  Any gaps in refill history greater than 2 weeks in the last 3 months: no  Demonstrates understanding of importance of adherence: yes              Were doses missed due to medication being on hold? No    DUPIXENT PEN 300   mg/ml: 0 days of medicine on hand       REFERRAL TO PHARMACIST     Referral to the pharmacist: Not needed      [facility]     Shipping address confirmed in Epic.       Delivery Scheduled: Yes, Expected medication delivery date: 04/27/23 .     Medication will be delivered via UPS to the prescription address in Epic WAM.    Lee Baxter   [facility] Specialty and Home Delivery Pharmacy  Specialty Technician

## 2023-04-26 MED FILL — DUPIXENT 300 MG/2 ML SUBCUTANEOUS PEN INJECTOR: SUBCUTANEOUS | 28 days supply | Qty: 4 | Fill #7

## 2023-04-30 ENCOUNTER — Ambulatory Visit: Admit: 2023-04-30 | Discharge: 2023-04-30 | Payer: MEDICARE | Attending: Internal Medicine | Primary: Internal Medicine

## 2023-04-30 ENCOUNTER — Ambulatory Visit: Admit: 2023-04-30 | Discharge: 2023-04-30 | Payer: MEDICARE

## 2023-04-30 DIAGNOSIS — Z79891 Long term (current) use of opiate analgesic: Principal | ICD-10-CM

## 2023-04-30 DIAGNOSIS — G8929 Other chronic pain: Principal | ICD-10-CM

## 2023-04-30 DIAGNOSIS — E119 Type 2 diabetes mellitus without complications: Principal | ICD-10-CM

## 2023-04-30 DIAGNOSIS — M545 Chronic midline low back pain without sciatica: Principal | ICD-10-CM

## 2023-04-30 DIAGNOSIS — Z0289 Encounter for other administrative examinations: Principal | ICD-10-CM

## 2023-04-30 DIAGNOSIS — I10 Essential (primary) hypertension: Principal | ICD-10-CM

## 2023-04-30 DIAGNOSIS — M5090 Cervical disc disorder, unspecified, unspecified cervical region: Principal | ICD-10-CM

## 2023-04-30 LAB — TOXICOLOGY SCREEN, URINE
AMPHETAMINE SCREEN URINE: NEGATIVE
BARBITURATE SCREEN URINE: NEGATIVE
BENZODIAZEPINE SCREEN, URINE: NEGATIVE
BUPRENORPHINE, URINE SCREEN: NEGATIVE
CANNABINOID SCREEN URINE: NEGATIVE
COCAINE(METAB.)SCREEN, URINE: NEGATIVE
METHADONE SCREEN, URINE: NEGATIVE
OPIATE SCREEN URINE: NEGATIVE
OXYCODONE SCREEN URINE: NEGATIVE

## 2023-04-30 LAB — ALBUMIN / CREATININE URINE RATIO
ALBUMIN QUANT URINE: 1 mg/dL
ALBUMIN/CREATININE RATIO: 9.5 ug/mg (ref 0.0–30.0)
CREATININE, URINE: 105 mg/dL

## 2023-04-30 MED ORDER — OXYCODONE-ACETAMINOPHEN 5 MG-325 MG TABLET
ORAL_TABLET | ORAL | 0 refills | 25 days | Status: CP | PRN
Start: 2023-04-30 — End: ?

## 2023-04-30 NOTE — Unmapped (Signed)
 Stop the blood pressure medication nifedipine 30 mg (Procardia).  Keep taking the lisinopril 10 mg blood pressure medication every day.  Check your blood pressure readings and if you are still low (less than 100/60) in a few weeks, let me know.

## 2023-04-30 NOTE — Unmapped (Signed)
 [facility]     Reason for visit: Follow up    Questions / Concerns that need to be addressed: no    Screening BP- 129/66 HR 108    Diabetes:  Regularly checking blood sugars?: yes  If yes, when? Complete log for past 7 days  Date Before Breakfast After Breakfast Before Lunch After Lunch Before Dinner After Dinner Before Bed    04/30/23 150                                                                                                                               Dexcom or Libre CGM in use? If so, pull appropriate reporting through portal (Dexcom) or EPIC order Josephine Igo).    HCDM reviewed and updated in Epic:    We are working to make sure all of our patients??? wishes are updated in Epic and part of that is documenting a Environmental health practitioner for each patient  A Health Care Decision Maker is someone you choose who can make health care decisions for you if you are not able - who would you most want to do this for you????  is already up to date.    HCDM (patient stated preference): Lee Baxter - Daughter - [phone]    BPAs completed:  PHQ2, PHQ9, GAD7, Diabetes - POC A1C, Diabetes - urine albumin/creatinine ratio, and Opioid agreement, urine toxicology, opioid confirmation    Annual Screenings:   SDOH , Tobacco, Substance Use, Domestic Abuse, Education, and Health Literacy

## 2023-04-30 NOTE — Unmapped (Signed)
 HISTORY:[name] is a 78 year old gentleman with a history of diabetes, hypertension, stage 3a CKD and newly diagnosed asthma-COPD overlap who presents for follow-up.     Since his last visit, the patient states he continues to be breathing well and has not used his oxygen.  He is using all of his inhalers as well as dipulimab injection every 14 days.    He states that of late his blood pressures have been low, recalls a reading 109/73 but up slightly dizzy.  He has not taken his blood pressure medications today.  He thinks he is drinking enough water, 2-3 bottles a day.  No bleeding from his mouth, rectum or urine.  He did not take his blood pressure medications or any medicines before this visit today.      Regarding his diabetes, this morning's fasting glucose level was 150.  He checks his sugar levels infrequently and they are usually well-controlled.  Since last visit, he has regained the weight he previously lost when he was ill and being treated for his lung cancer.  He has a good diet and appetite.    His chronic pain continues to be well-controlled with oxycodone-acetaminophen 5-325, 4-5 a day.  He had failed low-dose duloxetine before and he had to stop it because it made him feel sick and sleepy.      Patient Active Problem List   Diagnosis    Primary hypertension    Incisional hernia    Hyperlipidemia    Sensorineural hearing loss    Back pain    Allergic rhinitis    Adenomatous colon polyp    Asthma-COPD overlap syndrome (CMS-HCC)    Tobacco abuse    Chewing tobacco use    GERD without esophagitis    History of lung cancer    Diabetes mellitus type 2 without retinopathy (CMS-HCC)    Dry eye syndrome of bilateral lacrimal glands    Pseudophakia of both eyes    Myopia with astigmatism and presbyopia, bilateral    Stage 3a chronic kidney disease (CMS-HCC)    Tinnitus of both ears    Lung cancer (CMS-HCC)    Radiation pneumonitis (CMS-HCC)    Aspiration pneumonitis (CMS-HCC)      Reviewed and reflects modifications made at the visit.  Current Outpatient Medications   Medication Sig Dispense Refill    albuterol 2.5 mg /3 mL (0.083 %) nebulizer solution Inhale 3 mL (2.5 mg total) by nebulization every four (4) hours as needed for wheezing or shortness of breath. 60 mL 2    albuterol HFA 90 mcg/actuation inhaler TAKE 2 PUFFS BY MOUTH EVERY 6 HOURS AS NEEDED FOR WHEEZE 8 g 5    atorvastatin (LIPITOR) 40 MG tablet TAKE 1 TABLET BY MOUTH EVERY DAY IN THE EVENING 90 tablet 3    dupilumab 300 mg/2 mL PnIj Inject the contents of 1 pen (300 mg) under the skin every fourteen (14) days. 4 mL 11    empty container Misc Use as directed to dispose of Dupixent pens 1 each 2    EPINEPHrine (EPIPEN) 0.3 mg/0.3 mL injection Inject 0.3 mL (0.3 mg total) into the muscle once as needed for anaphylaxis for up to 1 dose. 1 each 0    finasteride (PROSCAR) 5 mg tablet Take 1 tablet (5 mg total) by mouth daily. 90 tablet 3    fluticasone propionate (FLONASE) 50 mcg/actuation nasal spray 2 sprays into each nostril daily. 48 mL 7    lisinopril (PRINIVIL,ZESTRIL) 10 MG tablet  Take 1 tablet (10 mg total) by mouth daily. 90 tablet 3    MEDICAL SUPPLY ITEM Pt has DM hx of neuropathy w/ callus. Followed for comprehensive care. For med records, fax release to [phone]. 2 Units 0    MEDICAL SUPPLY ITEM 1 pair of diabetic shoes and 3 pairs of inserts.  Pt has DM with neuropathy w/ callus. Followed for comprehensive care. For med records, fax release to [phone] 2 each 0    metFORMIN (GLUCOPHAGE) 500 MG tablet TAKE 1 TABLET (500MG  TOTAL) BY MOUTH IN THE MORNING AND TAKE 1 TABLET IN THE EVENING WITH MEALS 180 tablet 3    ofloxacin (OCUFLOX) 0.3 % ophthalmic solution APPLY 2 DROPS (OPHTHALMIC (EYE)) 4 TIMES PER DAY FOR 5 DAYS      sodium chloride 3 % NEBULIZER solution Inhale 1 vial ( 4 mL) by nebulization two (2) times a day. 720 mL 3    tamsulosin (FLOMAX) 0.4 mg capsule TAKE 1 CAPSULE BY MOUTH EVERY DAY 90 capsule 3    TRELEGY ELLIPTA 200-62.5-25 mcg DsDv INHALE 1 PUFF IN THE MORNING 60 each 5    blood-glucose meter kit Disp. blood glucose meter kit preferred by patient's insurance. Dx: Diabetes, E11.9 1 each 11    ipratropium-albuterol (DUO-NEB) 0.5-2.5 mg/3 mL nebulizer Inhale 1 vial ( 3 mL) by nebulization every six (6) hours. Use as directed 1080 mL 1    oxyCODONE-acetaminophen (PERCOCET) 5-325 mg per tablet Take 1 tablet by mouth every four (4) hours as needed for pain. 150 tablet 0    [START ON 05/28/2023] oxyCODONE-acetaminophen (PERCOCET) 5-325 mg per tablet Take 1 tablet by mouth every four (4) hours as needed for pain. 150 tablet 0    [START ON 06/29/2023] oxyCODONE-acetaminophen (PERCOCET) 5-325 mg per tablet Take 1 tablet by mouth every four (4) hours as needed for pain. 150 tablet 0     No current facility-administered medications for this visit.     Allergies   Allergen Reactions    Latex      Added based on information entered during case entry, please review and add reactions, type, and severity as needed        SOCIAL History: as above.  See previous notes.    PHYSICAL:  General: Well-developed, well-groomed and dressed gentleman who is in no respiratory distress  Vitals:   Vitals:    04/30/23 1138 04/30/23 1234   BP: 129/66 120/84   BP Site: L Arm    BP Position: Sitting Sitting   BP Cuff Size: Medium Medium   Pulse: 108 99   Resp: 18    Temp: 36.7 ??C (98.1 ??F)    TempSrc: Oral    SpO2: 97%    Weight: 61 kg (134 lb 6.4 oz)    Height: 165.1 cm (5' 5)       BP Readings from Last 3 Encounters:   04/30/23 120/84   01/22/23 130/75   11/15/22 130/71     Wt Readings from Last 3 Encounters:   04/30/23 61 kg (134 lb 6.4 oz)   01/22/23 57.6 kg (127 lb)   10/16/22 56.7 kg (125 lb)       Chest: Right basilar crackles, otherwise clear to auscultation.  CV: Normal S1 and S2, no murmur.  Ext:  No pedal edema.  Psychiatric: Bright mood, laughs.    DIAGNOSTIC TESTS:     04/30/2023: POC A1c 7.2%.        ASSESSMENT AND PLANS:    -- 1. Primary  hypertension, with occasional low blood pressure readings and some dizziness.  Will stop Procardia XL 30 mg.  Continue lisinopril 10 mg.  Check BMP and CBC.    -- 2.  Chronic pain due to low back pain and cervical spine.  He is status post cervical spine surgery.  Refilled oxycodone 5-acetaminophen 325 #150 for the first month, #150 for the second month, and #150 for the third month.  Has failed duloxetine low-dose in the past.  Signed chronic pain contract.  Ordered urine toxicology test.    -- 3.Diabetes mellitus type 2, today's A1c was 7.2%, showing continued good control.  Check urine albumin creatinine ratio.   continue metformin 100 mg twice a day, atorvastatin 40 mg.    -- 4.  Asthma-overlap syndrome, well-controlled with current regimen that he will continue.  Not needing oxygen at this time.  He will follow-up with his pulmonologist as scheduled.    --5.  Right lower lobe non-small cell lung cancer not pathologically confirmed, status post radiation therapy.  Will follow-up with radiation oncologist and obtain CT chest on 05/15/2023.    --6.  General health, recommended Tdap and RSV vaccine that he can obtain at the local pharmacy.    Follow-up with me in 3 months or as needed    This note was dictated using voice recognition software.  Please forgive typos.

## 2023-05-01 NOTE — Unmapped (Signed)
 Radiation Oncology Follow Up Visit Note    Patient Name: Lee Baxter  Patient Age: 78 y.o.  Encounter Date: 05/15/2023    Referring Physician:   Deeann Cree, MD  [address]  [place],  [place] [address]    Primary Care Provider:  Deeann Cree, MD    Diagnoses:  1. Malignant neoplasm of lower lobe of right lung (CMS-HCC)      Treatment Site: TWO RLL lesions, 1000cGy x 5 fractions = 5000cGy, completed 01/23/22    Interval Since Completion of Treatment: 1 year and 3 months      Assessment: 78 y.o. male with radiographic diagnosis of synchronous RLL NSCLC 2.9 and 2.5 cm lesions without pathologic confirmation (nondiagnostic) but in the setting of significant smoking history    CT Chest today was personally reviewed and reviewed and discussed with Dr Wyline Mood, demonstrating good control of previously treated RLL lesions. Waxing and waning of other pulm nodules in the right lung. Significant lung changes due to COPD.      Plan:  RTC in 3 months with repeat non con CT Chest.       Interval History:  Lee Baxter returns today for a regularly scheduled follow-up, he was last seen in clinic 07/25/22.      Since last visit, he re[ports doing well. He has been checking his pulse ox at home and will use the oxygen if his sats drop.  He is using his inhaler daily and overall feels his respiratory status is stable.  He spends his time around the house piddling and watching TV. Prefers westerns but sometimes watches the soaps.    Review of Systems: All other systems reviewed are negative. Pertinent positives and negatives are above in interval history.    Past Medical, Surgical, Family and Social Histories reviewed and updated in the electronic medical record.    Hematology/Oncology History   Lung cancer (CMS-HCC)   12/15/2021 -  Cancer Staged    Staging form: Lung, AJCC 8th Edition  - Clinical stage from 12/15/2021: cT1, cN0, cM0 - Signed by Janell Quiet, PA on 05/01/2023       01/17/2022 Initial Diagnosis Lung cancer (CMS-HCC)           Physical Exam:  Vital Signs for this encounter:  There were no vitals taken for this visit.  Last weight:    Wt Readings from Last 4 Encounters:   04/30/23 61 kg (134 lb 6.4 oz)   01/22/23 57.6 kg (127 lb)   10/16/22 56.7 kg (125 lb)   09/21/22 56 kg (123 lb 8 oz)     Karnofsky/Lansky Performance Status: 70, Cares for self; unable to carry on normal activity or to do active work (ECOG equivalent 1)  General:   No acute distress, alert and oriented X 4   HEENT:  Normocephalic and atraumatic.  Neck/lymphatics:  Supple.  Respiratory:  Breathing is non-labored.  Extremities:  No cyanosis or edema.  Neuro:   Alert and oriented x4.         Radiology  Results for orders placed during the hospital encounter of 05/15/23    CT Chest Wo Contrast    Impression  1. Anticipated changes associated radiation therapy in the lower lobe of the right lung without findings to suggest locally recurrent or new neoplastic disease within the thorax.    2. Improved, near resolution of a small focus of consolidation in the upper lobe of the right lung evident  on the comparison exam from November 2024 (image 49 series 6)    3. Notable chronic findings include:  - Advanced obstructive changes of centrilobular emphysema  - Calcified atherosclerotic disease within the coronary arterial distribution (heavy)    Signed by: Delsa Grana on 05/15/2023  6:05 PM          Electronically signed by:  Sydnee Levans, PA-C  Department of Radiation Oncology  [facility]  [id number]  [place], [place] [address]  O: [phone]  05/15/23 9:53 AM

## 2023-05-02 LAB — OPIATE, URINE, QUANTITATIVE
6-MONOACETYLMRPH: <5 ng/mL (ref <5)
BUPRENORPHINE: <5 ng/mL (ref <5)
CODEINE GC/MS CONF: <25 ng/mL (ref <25)
FENTANYL, URINE GC/MS: <0.5 ng/mL (ref <0.5)
HYDROCODONE GC/MS CONF: <25 ng/mL (ref <25)
HYDROMORPHONE GC/MS CONF: <25 ng/mL (ref <25)
MORPHINE GC/MS CONF: <25 ng/mL (ref <25)
NORBUPRENORPHINE: <5 ng/mL (ref <5)
NORFENTANYL, UR GC/MS: <1 ng/mL (ref <1.0)
OPIATE INTERP: NEGATIVE
OXYCODONE (GC/MS): <25 ng/mL (ref <25)
OXYMORPHONE: <25 ng/mL (ref <25)

## 2023-05-02 NOTE — Unmapped (Signed)
 Informed the patient of acceptable urine albumin creatinine ratio.  Informed him that his urine toxicology test did not show that he is taking his pain medication but he states that he is (last dose the night before the test was taken).  He is aware of his A1c being 7.2% and he will continue with his current diabetes management.

## 2023-05-09 NOTE — Unmapped (Signed)
 [facility] Refill Coordination Note    Lee Baxter, DOB: 1946-02-09  Phone: [phone] (home) [phone] (work)      All above HIPAA information was verified with patient.         05/09/2023     8:45 AM   Specialty Rx Medication Refill Questionnaire   Which Medications would you like refilled and shipped? Dupixent Pen 300mg /34ml Pnij   Please list all current allergies: None   Have you missed any doses in the last 30 days? No   Have you had any changes to your medication(s) since your last refill? No   How many days remaining of each medication do you have at home? 103   If receiving an injectable medication, next injection date is 05/18/2023   Have you experienced any side effects in the last 30 days? No   Please enter the full address (street address, city, state, zip code) where you would like your medication(s) to be delivered to. [address] [place] [address]   Please specify on which day you would like your medication(s) to arrive. Note: if you need your medication(s) within 3 days, please call the pharmacy to schedule your order at [phone]  05/17/2023   Has your insurance changed since your last refill? No   Would you like a pharmacist to call you to discuss your medication(s)? No   Do you require a signature for your package? (Note: if we are billing Medicare Part B or your order contains a controlled substance, we will require a signature) No         Completed refill call assessment today to schedule patient's medication shipment from the [facility] [phone]).  All relevant notes have been reviewed.       Confirmed patient received a Conservation officer, [name], mining with first shipment. The patient will receive a drug information handout for each medication shipped and additional FDA Medication Guides as required.         REFERRAL TO PHARMACIST     Referral to the pharmacist: Not needed      [facility] Shipping address confirmed in Epic.     Delivery Scheduled: Yes, Expected medication delivery date: 05/17/23.     Medication will be delivered via UPS to the prescription address in Epic WAM.    Lee Baxter   [facility] Specialty Technician

## 2023-05-11 MED ORDER — TRELEGY ELLIPTA 200 MCG-62.5 MCG-25 MCG POWDER FOR INHALATION
Freq: Every day | RESPIRATORY_TRACT | 5 refills | 0.00 days
Start: 2023-05-11 — End: ?

## 2023-05-11 NOTE — Unmapped (Signed)
Please refill if appropriate.    Most recent clinic visit: Visit date not found    Next clinic visit: Visit date not found

## 2023-05-13 MED ORDER — TRELEGY ELLIPTA 200 MCG-62.5 MCG-25 MCG POWDER FOR INHALATION
Freq: Every day | RESPIRATORY_TRACT | 5 refills | 0 days | Status: CP
Start: 2023-05-13 — End: ?

## 2023-05-15 ENCOUNTER — Inpatient Hospital Stay
Admit: 2023-05-15 | Discharge: 2023-05-16 | Payer: MEDICARE | Attending: Radiation Oncology | Primary: Radiation Oncology

## 2023-05-15 ENCOUNTER — Inpatient Hospital Stay: Admit: 2023-05-15 | Discharge: 2023-05-15 | Payer: MEDICARE

## 2023-05-15 NOTE — Unmapped (Signed)
 05/15/2023    Dose Site Summary:    SiteLast TxDose  Rx:Two RLL L: 01/23/2022: 5,000/5,000 cGy    Subjective/Assessment/Recommendations:    1. Nutrition: Eating well and Weight stable  2. Esophagitis: No problems  3. Chemo: Not administered  4. Smoking Cessation: N/A  5. Fatigue: No issues  6. Pain: Denies pain  7. Elimination: No issues  8. Prescription Needs: None  9. Psychosocial: Has home support, here with great grand daughter  90. Other: Patient seems to be very active and engaged, denies using home O2, but does have available    Wt Readings from Last 6 Encounters:   05/15/23 60.7 kg (133 lb 12.8 oz)   04/30/23 61 kg (134 lb 6.4 oz)   01/22/23 57.6 kg (127 lb)   10/16/22 56.7 kg (125 lb)   09/21/22 56 kg (123 lb 8 oz)   09/15/22 55.3 kg (122 lb)

## 2023-05-16 MED FILL — DUPIXENT 300 MG/2 ML SUBCUTANEOUS PEN INJECTOR: SUBCUTANEOUS | 28 days supply | Qty: 4 | Fill #8

## 2023-05-23 NOTE — Unmapped (Incomplete)
 Pulmonary Clinic -follow-up visit    Referring Physician :  Marlyne Baxter  PCP:     Lee Cree, MD  Reason for Consult:   Advanced destructive emphysema/COPD    HISTORY:     History of Present Illness:  Lee Baxter is a 78 y.o. male with a history of COPD, hypertension, prior history of left upper lobe adenocarcinoma status post partial lobectomy followed by right lower lobe non-small cell lung cancer not pathologically confirmed status post radiation in November 2023, complicated by radiation pneumonitis status post receiving steroids in February 2024, history of GERD and CKD as well, whom we are seeing in consultation requested by Lee Baxter for evaluation of  advanced obstructive emphysema/COPD .    Patient comes in here today with his daughter Lee Baxter, to establish care.    Significant medical history: Does not recollect having asthma as a child.  Smoked up to a pack a day for 35 years, quit 25 years ago.  However thereafter started chewing tobacco and she was a pack over 2 days.  Denies any vaping or any other inhalational products.  He was diagnosed with left upper lobe adenocarcinoma in the 1990s and underwent partial lobectomy.  During follow-up, he was found to have 2 nodules, in the right lower lobe, which were mildly PET avid, underwent a transbronchial biopsy however was not diagnostic.  With this being very concerning for cancer, he underwent radiation in November 2023.  Thereafter he developed complication from radiation and radiation pneumonitis receiving steroids in the month of February, with a weekly taper of 10 mg starting at 40 mg.  The empiric radiation was with 1000 cGy for 5 fractions, completed 09 February 2022.  Radiation pneumonitis was diagnosed in February 2024 with his symptoms being shortness of breath cough and phlegm.  He started requiring oxygen thereafter and now uses 2 L of oxygen at rest on exertion and at nighttime.    He was admitted in April 2024, due to similar symptoms of cough and shortness of breath when pulmonary was consulted.  A pleural effusion was also noted on the right side which was drained, cultures were negative, cytology negative for cancer.  Bronchoscopy with BAL was also performed with negative cultures, negative Fungitell, negative BAL galactomannan and negative cytology for malignancy.    Pulmonary was asked if he required no steroids at that time and due to the fact that he had completed a steroid taper, it was thought not necessary.  He was treated with a COPD exacerbation with a short course of steroids and antibiotics.  He was also given 3% saline during the hospitalization for clearance of his airways as he had developed traction bronchiectasis in his right lower lobe from the radiation pneumonitis.    Currently, he is on Trelegy Ellipta, does 1 puff every day.  Is not on any chronic steroids or azithromycin.    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  Since our last visit, we have done a few things.  A 6-minute walk test was done which showed that patient required 2 to 3 L oxygen on exertion.  Arterial blood gas was done which did not show hypercapnia.  Pulmonary function testing showed moderate obstruction with some reversibility, with gas trapping, and a severely low DLCO. We also had started the patient on Dupixent since her last visit.  Symptomatically, patient notes that he is doing a lot better than last time, very happy and cheerful, not on the wheelchair anymore and walked himself with a cane with his granddaughter.  He notes that the nebulizer treatment and the shots along with the inhaler have really helped his breathing symptoms and that he has clear mucus production now and not congested all the time.  He does not note any use of steroids or antibiotics in the last 3 months since my last visit.  The shortness of breath is still present however he says he is able to manage his activities better than before.  He also notes completing his pulmonary rehab however as his blood pressure increased he had to stop it.  He is getting his blood pressure managed better by his primary care doctor now.    Interval History 3/12/20225        Past Medical History:  Past Medical History:   Diagnosis Date    Adenomatous colon polyp     Asthma     Cervical disc disorder 04/17/2013    cervical spine MRI from February 2014 discharge DJD and foraminal stenosis at C4- 5, C3- 4 and C6- 7.     Cervical stenosis of spinal canal 11/06/2019    COPD (chronic obstructive pulmonary disease) (CMS-HCC)     Diabetes mellitus (CMS-HCC) Dx 1990    Type II    Diverticulosis     Hemorrhoids, internal     HL (hearing loss)     Hypertension     Lung cancer (CMS-HCC) 1992    Pleural effusion, right 06/25/2022    PVD (posterior vitreous detachment), left eye     Sinusitis     Temporomandibular joint disorders 01/15/2012     Past Surgical History:   Procedure Laterality Date    CATARACT EXTRACTION W/ INTRAOCULAR LENS IMPLANT Left 02/13/2006    Hecker MD    CATARACT EXTRACTION W/ INTRAOCULAR LENS IMPLANT Right 03/27/2006    Hecker MD    CHOLECYSTECTOMY      HERNIA REPAIR      KIDNEY STONE SURGERY      LUNG LOBECTOMY Left 1992    PR ALLOGRAFT FOR SPINE SURGERY ONLY MORSELIZED N/A 11/06/2019    Procedure: ALLOGRAFT FOR SPINE SURGERY ONLY; MORSELIZED;  Surgeon: Nemiah Commander, MD;  [facility]: [facility];  Service: Orthopedics    PR ARTHRD PST/PSTLAT TQ 1NTRSPC CRV BELW C2 SEGMENT N/A 11/06/2019    Procedure: ARTHRODESIS, POSTERIOR OR POSTEROLATERAL TECHNIQUE, SINGLE LEVEL; CERVICAL BELOW C2 SEGMENT;  Surgeon: Nemiah Commander, MD;  Location: [facility] OR [facility];  Service: Orthopedics    PR ARTHRODESIS PST/PSTLAT TQ 1NTRSPC EA ADDL NTRSPC N/A 11/06/2019    Procedure: ARTHRODESIS, POSTERIOR OR POSTEROLATERAL TECHNIQUE, SINGLE LEVEL; EACH ADDITIONAL VERTEBRAL SEGMENT   x4;  Surgeon: Nemiah Commander, MD;  Location: [facility] OR [facility];  Service: Orthopedics  PR AUTOGRAFT SPINE SURGERY LOCAL FROM SAME INCISION N/A 11/06/2019    Procedure: AUTOGRAFT/SPINE SURG ONLY (W/HARVEST GRAFT); LOCAL (EG, RIB/SPINOUS PROC, LAM FRGMT) OBTAIN FROM SAME INCIS;  Surgeon: Nemiah Commander, MD;  Location: [facility] OR [facility];  Service: Orthopedics    PR BRNSCHSC TNDSC EBUS DX/TX INTERVENTION [facility] LES Right 11/21/2021    Procedure: BRONCH, RIGID OR FLEXIBLE, INCLUDING FLUORO GUIDANCE, WHEN PERFORMED; WITH TRANSENDOSCOPIC EBUS DURING BRONCHOSCOPIC DIAGNOSTIC OR THERAPEUTIC INTERVENTION(S) FOR PERIPHERAL LESION(S);  Surgeon: Jerelyn Charles, MD;  Location: MAIN OR [place];  Service: Pulmonary    PR BRONCHOSCOPY,COMPUTER ASSIST/IMAGE-GUIDED NAVIGATION N/A 11/21/2021    Procedure: ROBOT ION BRONCHOSCOPY,RIGID OR FLEXIBLE,INCLUDE FLUORO WHEN PERFORMED; W/COMPUTER-ASSIST,IMAGE-GUIDED NAVIGATION;  Surgeon: Jerelyn Charles, MD;  Location: MAIN OR [place];  Service: Pulmonary    PR BRONCHOSCOPY,COMPUTER ASSIST/IMAGE-GUIDED NAVIGATION N/A 11/21/2021    Procedure: BRONCHOSCOPY, RIGID OR FLEXIBLE, INCLUDE FLUORO WHEN PERFORMED; W/COMPUTER-ASSIST, IMAGE-GUIDED NAVIGATION;  Surgeon: Jerelyn Charles, MD;  Location: MAIN OR [place];  Service: Pulmonary    PR BRONCHOSCOPY,DIAGNOSTIC W LAVAGE Right 06/27/2022    Procedure: BRONCHOSCOPY, RIGID OR FLEXIBLE, INCLUDE FLUOROSCOPIC GUIDANCE WHEN PERFORMED; W/BRONCHIAL ALVEOLAR LAVAGE;  Surgeon: Truett Mainland, MD;  Location: MAIN OR [place];  Service: Pulmonary    PR BRONCHOSCOPY,PLACEMENT FIDUCIAL MARKERS, 1/MULT N/A 11/21/2021    Procedure: BRONCHOSCOPY, RIGID OR FLEXIBLE, FLOURO WHEN PERFORMED; PLACEMENT OF FIDUCIAL MARKERS, SINGLE OR MULTIPLE;  Surgeon: Jerelyn Charles, MD;  Location: MAIN OR [place];  Service: Pulmonary    PR BRONCHOSCOPY,TRANSBRON ASPIR BX Right 11/21/2021    Procedure: BRONCHOSCOPY, RIGID/FLEX, INCL FLUORO; W/TRANSBRONCH NDL ASPIRAT BX, TRACHEA, MAIN STEM &/OR LOBAR BRONCHUS;  Surgeon: Jerelyn Charles, MD;  Location: MAIN OR [place];  Service: Pulmonary    PR BRONCHOSCOPY,TRANSBRONCH BIOPSY Right 11/21/2021    Procedure: BRONCHOSCOPY, RIGID/FLEXIBLE, INCLUDE FLUORO GUIDANCE WHEN PERFORMED; W/TRANSBRONCHIAL LUNG BX, SINGLE LOBE;  Surgeon: Jerelyn Charles, MD;  Location: MAIN OR [place];  Service: Pulmonary    PR C-LAMINOPLASTY W/GRAFT/PLATE, 2 OR MORE N/A 11/06/2019    Procedure: LAMINOPLASTY, CERVICAL, DECOMPRESS SPINAL CORD, 2+ VERTEBRAL SEGMENTS; W/RECONSTRUCT POSTERIOR BONY ELEMENT;  Surgeon: Nemiah Commander, MD;  Location: [facility] OR [facility];  Service: Orthopedics    PR COLONOSCOPY W/BIOPSY SINGLE/MULTIPLE N/A 07/05/2017    Procedure: COLONOSCOPY, FLEXIBLE, PROXIMAL TO SPLENIC FLEXURE; WITH BIOPSY, SINGLE OR MULTIPLE;  Surgeon: Rona Ravens, MD;  [facility];  Service: Gastroenterology    PR IONM 1 ON 1 IN OR W/ATTENDANCE EACH 15 MINUTES N/A 11/06/2019    Procedure: CONTINUOUS INTRAOPERATIVE NEUROPHYSIOLOGY MONITORING IN OR;  Surgeon: Nemiah Commander, MD;  Location: [facility] OR [facility];  Service: Orthopedics    PR LAM W/O FACETEC FORAMOT/DSKC 1/2 VRT SEG, CERVICAL N/A 11/06/2019    Procedure: LAMINECT W/EXPLOR WO FACETECT 1-2 VERTEB; CERV;  Surgeon: Nemiah Commander, MD;  Location: [facility] OR [facility];  Service: Orthopedics    PR POSTERIOR SEGMENTAL INSTRUMENTATION 3-6 VRT SEG N/A 11/06/2019 Procedure: POST SEGMT INSTRUM; 3 TO 6 VERTEB SEGMT CERVICAL;  Surgeon: Nemiah Commander, MD;  Location: [facility] OR [facility];  Service: Orthopedics    SINUS SURGERY         Other History:  The social history and family history were personally reviewed and updated in the patient's electronic medical record.    Family History   Problem Relation Age of Onset    Cancer Daughter     Strabismus Daughter     Hypertension Daughter     No Known Problems  Mother     No Known Problems Father     No Known Problems Sister     No Known Problems Brother     No Known Problems Maternal Aunt     No Known Problems Maternal Uncle     No Known Problems Paternal Aunt     No Known Problems Paternal Uncle     No Known Problems Maternal Grandmother     No Known Problems Maternal Grandfather     No Known Problems Paternal Grandmother     No Known Problems Paternal Grandfather     Amblyopia Neg Hx     Blindness Neg Hx     Cataracts Neg Hx     Diabetes Neg Hx     Glaucoma Neg Hx     Macular degeneration Neg Hx     Retinal detachment Neg Hx     Stroke Neg Hx     Thyroid disease Neg Hx      Social History     Socioeconomic History    Marital status: Widowed   Tobacco Use    Smoking status: Former     Current packs/day: 0.00     Average packs/day: 1 pack/day for 35.0 years (35.0 ttl pk-yrs)     Types: Cigarettes     Start date: 02/19/1953     Quit date: 08/12/1987     Years since quitting: 35.8    Smokeless tobacco: Current     Types: Chew   Vaping Use    Vaping status: Never Used   Substance and Sexual Activity    Alcohol use: No     Alcohol/week: 0.0 standard drinks of alcohol    Drug use: No   Social History Narrative    Lives w/ two granddaughters (2 and 8 yrs old). Is a widower. Was married 46yrs. Used to work in Holiday representative. His daughter buys his groceries but he cooks and pays bills. Former smoker, smoked for 35 yrs, quit 20 yrs ago. Has been cheewing tobacco since.     Social Drivers of Psychologist, prison and probation services Strain: Low Risk (06/28/2022)    Overall Financial Resource Strain (CARDIA)     Difficulty of Paying Living Expenses: Not hard at all   Food Insecurity: No Food Insecurity (06/28/2022)    Hunger Vital Sign     Worried About Running Out of Food in the Last Year: Never true     Ran Out of Food in the Last Year: Never true   Transportation Needs: No Transportation Needs (06/28/2022)    PRAPARE - Therapist, art (Medical): No     Lack of Transportation (Non-Medical): No   Housing: Low Risk  (06/28/2022)    Housing     Within the past 12 months, have you ever stayed: outside, in a car, in a tent, in an overnight shelter, or temporarily in someone else's home (i.e. couch-surfing)?: No     Are you worried about losing your housing?: No       Home Medications:  Current Outpatient Medications on File Prior to Visit   Medication Sig Dispense Refill    albuterol 2.5 mg /3 mL (0.083 %) nebulizer solution Inhale 3 mL (2.5 mg total) by nebulization every four (4) hours as needed for wheezing or shortness of breath. 60 mL 2    albuterol HFA 90 mcg/actuation inhaler TAKE 2 PUFFS BY MOUTH EVERY 6 HOURS AS NEEDED FOR WHEEZE 8 g 5  atorvastatin (LIPITOR) 40 MG tablet TAKE 1 TABLET BY MOUTH EVERY DAY IN THE EVENING 90 tablet 3    [EXPIRED] blood sugar diagnostic (GLUCOSE BLOOD) Strp Disp test strips preferred by insurance plan. Testing qday, Dx: E11.9 (Type 2 DM- controlled) 50 strip 11    blood-glucose meter kit Disp. blood glucose meter kit preferred by patient's insurance. Dx: Diabetes, E11.9 1 each 11    [EXPIRED] clotrimazole (LOTRIMIN) 1 % cream Apply topically two (2) times a day. 60 g 1    dupilumab 300 mg/2 mL PnIj Inject the contents of 1 pen (300 mg) under the skin every fourteen (14) days. 4 mL 11    empty container Misc Use as directed to dispose of Dupixent pens 1 each 2    EPINEPHrine (EPIPEN) 0.3 mg/0.3 mL injection Inject 0.3 mL (0.3 mg total) into the muscle once as needed for anaphylaxis for up to 1 dose. 1 each 0    finasteride (PROSCAR) 5 mg tablet Take 1 tablet (5 mg total) by mouth daily. 90 tablet 3    fluticasone propionate (FLONASE) 50 mcg/actuation nasal spray 2 sprays into each nostril daily. 48 mL 7    ipratropium-albuterol (DUO-NEB) 0.5-2.5 mg/3 mL nebulizer Inhale 1 vial ( 3 mL) by nebulization every six (6) hours. Use as directed 1080 mL 1    [EXPIRED] lancets Misc Disp. lancets #100 or amount allowed, Testing Qday. Dx: E11.9 (Diabetes- controlled) 100 each 11    lisinopril (PRINIVIL,ZESTRIL) 10 MG tablet Take 1 tablet (10 mg total) by mouth daily. 90 tablet 3    MEDICAL SUPPLY ITEM Pt has DM hx of neuropathy w/ callus. Followed for comprehensive care. For med records, fax release to [phone]. 2 Units 0    MEDICAL SUPPLY ITEM 1 pair of diabetic shoes and 3 pairs of inserts.  Pt has DM with neuropathy w/ callus. Followed for comprehensive care. For med records, fax release to [phone] 2 each 0    metFORMIN (GLUCOPHAGE) 500 MG tablet TAKE 1 TABLET (500MG  TOTAL) BY MOUTH IN THE MORNING AND TAKE 1 TABLET IN THE EVENING WITH MEALS 180 tablet 3    ofloxacin (OCUFLOX) 0.3 % ophthalmic solution APPLY 2 DROPS (OPHTHALMIC (EYE)) 4 TIMES PER DAY FOR 5 DAYS      oxyCODONE-acetaminophen (PERCOCET) 5-325 mg per tablet Take 1 tablet by mouth every four (4) hours as needed for pain. 150 tablet 0    [START ON 05/28/2023] oxyCODONE-acetaminophen (PERCOCET) 5-325 mg per tablet Take 1 tablet by mouth every four (4) hours as needed for pain. 150 tablet 0    [START ON 06/29/2023] oxyCODONE-acetaminophen (PERCOCET) 5-325 mg per tablet Take 1 tablet by mouth every four (4) hours as needed for pain. 150 tablet 0    sodium chloride 3 % NEBULIZER solution Inhale 1 vial ( 4 mL) by nebulization two (2) times a day. 720 mL 3    tamsulosin (FLOMAX) 0.4 mg capsule TAKE 1 CAPSULE BY MOUTH EVERY DAY 90 capsule 3    TRELEGY ELLIPTA 200-62.5-25 mcg DsDv INHALE 1 PUFF IN THE MORNING 60 each 5     No current facility-administered medications on file prior to visit.       Allergies:  Allergies as of 05/23/2023 - Reviewed 04/30/2023   Allergen Reaction Noted    Latex  06/26/2022       Review of Systems:  A comprehensive review of systems was completed and negative except as noted in HPI.    PHYSICAL EXAM:   There were no  vitals taken for this visit.    ***  Gen: Patient is an elderly male, accompanied by his granddaughter, on 2 L oxygen awake, alert, oriented to time place and person, no pallor, icterus,  cyanosis, mild clubbing.  Eyes: Pupils equal round and reacting light bilaterally  Head neck ENT: No JVD, no lymphadenopathy-cervical or supraclavicular, no thyromegaly, moist mucosa, normal oropharynx  CVS: S1-S2 heard normally, no murmurs appreciated, no rubs or gallops  Respiratory: Air entry reduced bilaterally with coarse breath sounds at the right base  Abdomen: Soft, nontender, nondistended, no organomegaly, bowel sounds present  Neuro: Nonfocal neurological exam, no sensory deficits, cranial nerves grossly intact  Skin extremities: No rash, no pedal edema     LABORATORY and RADIOLOGY DATA:     Pulmonary Function Tests/Interpretation:      7/5/024:            Pertinent Laboratory Data:     Latest Reference Range & Units 08/23/22 12:29   Aspergillus fumigatus IgE <0.35 kUA/L 1.26 (H)   Fusarium Proliferatum IgE <0.35 kUA/L 0.37 (H)   German Cockroach IgE <0.35 kUA/L 0.41 (H)   P chrysogenum (P notatum) IgE <0.35 kUA/L 0.53 (H)   Trichophyton rubrum IgE <0.35 kUA/L 2.69 (H)   Total IgE 3 - 48 IU/mL 787 (H)   A. fumigatus IgE <0.35 kU/L 1.22 (H)   (H): Data is abnormally high     Latest Reference Range & Units 08/23/22 12:29   Absolute Eosinophils 0.0 - 0.5 10*9/L 0.3        Latest Reference Range & Units 09/15/22 10:08   FIO2 Arterial  Room Air   pH, Arterial 7.35 - 7.45  7.45   pCO2, Arterial 35.0 - 45.0 mm Hg 37.2   pO2, Arterial 80.0 - 110.0 mm Hg 60.2 (L)   HCO3 Art 22 - 27 mmol/L 26   Base Excess, Arterial -2.0 - 2.0  1.6   O2 Sat, Arterial 94.0 - 100.0 % 92.1 (L)   Specimen Source  Arterial, left radial   (L): Data is abnormally low      Lab Results   Component Value Date    WBC 8.7 08/23/2022    HGB 11.9 (L) 08/23/2022    HCT 35.4 (L) 08/23/2022    PLT 384 08/23/2022       Lab Results   Component Value Date    NA 137 08/23/2022    K 5.0 (H) 08/23/2022    CL 101 08/23/2022    CO2 30.2 08/23/2022    BUN 24 (H) 08/23/2022    CREATININE 1.37 (H) 08/23/2022    GLU 204 (H) 08/23/2022    CALCIUM 9.5 08/23/2022    MG 1.6 07/17/2022    PHOS 2.3 (L) 07/17/2022       Lab Results   Component Value Date    BILITOT 0.4 08/23/2022    PROT 6.5 08/23/2022    ALBUMIN 3.1 (L) 08/23/2022    ALT 10 08/23/2022    AST 16 08/23/2022    ALKPHOS 91 08/23/2022    GGT 19 01/11/2012       Lab Results   Component Value Date    INR 1.07 10/20/2019    APTT 35.1 10/20/2019      Latest Reference Range & Units 06/30/20 20:40 09/30/21 11:57 04/19/22 11:58   Absolute Eosinophils 0.0 - 0.5 10*9/L 0.4 0.3 0.2     AFB smear and culture: 4/16-17/24: negative (pleural and bronchial)  Bronchial and pleural fluid culture: no growth  Fungal: negative  BAL GAL <0.5  Fungitell <31  RPP negative    A. Pleural fluid , cytology:  - Negative for malignant cells  - Reactive mesothelial cells, macrophages and chronic inflammation    BAL, right middle lobe, cytology:  - No cytologically malignant cells seen  - Abundant macrophages and mixed inflammatory cells    Pertinent Imaging Data:      CT chest 05/15/2023:  1. Anticipated changes associated radiation therapy in the lower lobe of the right lung without findings to suggest locally recurrent or new neoplastic disease within the thorax.      2. Improved, near resolution of a small focus of consolidation in the upper lobe of the right lung evident on the comparison exam from November 2024 (image 49 series 6)      3. Notable chronic findings include:   - Advanced obstructive changes of centrilobular emphysema   - Calcified atherosclerotic disease within the coronary arterial distribution (heavy)     CT chest 02/02/2023:  Post treatment changes of the right lower lobe without evidence of local recurrence.      New clustered nodularity in the anterior right upper lobe which can be infectious or inflammatory, however, follow-up CT chest in 4 to 6 weeks is recommended to assess change and ensure resolution.      Advanced destructive centrilobular emphysema.         CT chest 10/26/2022:  Impression      1. No findings to suggest progression of neoplastic disease involving the thorax      2. No findings of infectious pneumonia or other acute pulmonary process      3. Notable chronic findings include:   - Advanced destructive centrilobular emphysema   - Calcified atherosclerotic disease within the coronary arterial distribution         Echo 06/16/22: Summary    1. The left ventricle is relatively small in size with upper normal wall  thickness.    2. The left ventricular systolic function is normal, LVEF is visually  estimated at > 55%.    3. The right ventricle is normal in size, with normal systolic function.       CT chest: 07/25/22:   Impression      --Persistent but improved right middle and lower lobe airspace disease likely related to radiation therapy with nonvisualization of previously treated lesions.      --Increased left lower lobe airspace disease which could be treatment related or secondary to superimposed infection.      --Interval decrease in size of right apical nodule.      --Small right pleural effusion.         CTA 06/25/22:   Impression      * No acute pulmonary emboli.   * Increased right middle and right lower lobe airspace disease which could be treatment related or secondary to superimposed pneumonia or aspiration given airway debris.   * Scattered new irregular nodular opacities in both lungs are likely infectious/inflammatory in etiology with neoplasm considered less likely. Attention on follow-up imaging is recommended.   * Increased size of small right pleural effusion.   * Advanced destructive emphysema.       CT 04/25/22:   Impression      1. New extensive consolidation in the lower lobe of the right lung like reflects radiation therapy related pneumonitis. Underlying treated lesion cannot be differentiated from the surrounding consolidation.      2. New single irregular nodular lesions in  the apices of both lungs likely relate to benign change. However, consider CT imaging surveillance in 2-3 months to ensure resolution.      3. Calcified atherosclerotic disease within the coronary arterial distribution (heavy)       CT 11/10/21: Extensive centrilobular emphysema and mild bronchial wall thickening consistent with COPD. Postsurgical changes of left upper lobectomy. Stable 2.9 cm and 2.5 cm groundglass opacities within the emphysema in the right lower lobe (series 4, image 49, 42). Scattered calcified granuloma in the right upper and right lower lobes. Central airways are patent. No pleural effusion or pneumothorax.         ASSESSMENT and PLAN     OLUWATIMILEYIN VIVIER is a 78 y.o. male with history of COPD, hypertension, prior history of left upper lobe adenocarcinoma status post partial lobectomy followed by right lower lobe non-small cell lung cancer not pathologically confirmed status post radiation in November 2023, complicated by radiation pneumonitis status post receiving steroids in February 2024, history of GERD and CKD as well Is here for management and optimization of his COPD and radiation pneumonitis related bronchiectatic changes in the right lower lobe.    Overall, patient has advanced destructive emphysema on his CT scan, and clinically he has COPD Gold stage IIE,  He is currently on 2-3 L oxygen based on his formal 6-minute evaluation for daytime oxygen.  He also uses the same amount of oxygen at nighttime.     Since last visit, patient notes that he is doing a lot better overall symptomatically with the airway clearance twice a day with saline, Trelegy Ellipta as well as the Dupixent shots.    I discussed with him the timing of these therapies and have given him detailed instructions today in clinic as well.  I have also given him sputum cups to go home with, since he produces whitish clear mucus in the morning, which can be dropped off at a [facility] near him.    He has tolerated Dupixent very well and has not noted any side effects from the same.  His daughter gives him the shot at home without any difficulty.         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  -Genotype for alpha-1 antitrypsin - MM  -I have also placed orders for lower respiratory culture and sputum culture and given them cups to go home with, which the daughter will drop off to [facility] once he starts coughing up more sputum with the saline.  -Overall, patient doing really well and therefore has had better ability to perform his day-to-day activities.  I will continue the same regimen as above and follow-up in 6 months.      Oxygen Requirement  -Patient is currently on 2 L oxygen at rest, on exertion and at night.      Chronic NIV:  -No hypercapnia noted on ABG    Chronic steroid and macrolide requirement:  -Currently the patient does not require either, will reassess at next visit    Health Maintenance:    Most Recent Immunizations   Administered Date(s) Administered    COVID-19 VAC,BIVALENT(47YR UP),PFIZER 12/07/2020    COVID-19 VACC,MRNA,(PFIZER)(PF) 12/10/2019    COVID-19 VACCINE,MRNA(MODERNA)(PF) 07/12/2020    INFLUENZA IIV3 HIGH DOSE 46YRS+(FLUZONE)  01/22/2023    INFLUENZA QUAD ADJUVANTED 9YR UP(FLUAD) 11/26/2020    INFLUENZA QUAD HIGH DOSE 9YRS+(FLUZONE) 11/04/2018    INFLUENZA TIV (TRI) PF (IM)(HISTORICAL) 11/23/2009    Influenza Vaccine Quad(IM)6 MO-Adult(PF) 05/08/2022    Influenza Virus Vaccine, unspecified formulation 11/26/2019    PNEUMOCOCCAL POLYSACCHARIDE 23-VALENT 08/30/2012    Pneumococcal Conjugate 13-Valent 11/20/2013    Td (adult) unspecified formulation 07/17/2003    TdaP 02/23/2012    ZOSTAVAX - ZOSTER VACCINE, LIVE, SQ 02/19/2014       Smoking Cessation:  Patient is currently not smoking cigarettes but he is chewing tobacco for the last 30 years.  I counseled him that if it was only a matter of having something in his mouth as he did not want to do candy chewing because of diabetes, there are sugar-free candies available that he can suck on and try to quit chewing tobacco.  I told him that chewing tobacco is also very high risk for causing oral cancer, lung cancer as well as pancreatic and stomach cancer.    Pulmonary Rehab:  Currently patient is already enrolled in a rehab program and is feeling better about it.  However since his blood pressure increased, he had to discontinue rehab.  He then graduated from rehab and he is supposed to be doing exercises at home.  I encouraged him to do them at home every day.     Lung Cancer Screening  The USPSTF recommends annual screening for lung cancer with low-dose computed tomography in adults ages 58 to 52 years who have a 20 pack-year smoking history and currently smoke or have quit within the past 15 years. Screening should be discontinued once a person has not smoked for 15 years or develops a health problem that substantially limits life expectancy or the ability or willingness to have curative lung surgery.    LeeNoto does not meet criteria for lung cancer screening according to the USPSTF guidelines as he already has lung cancer and is treated for it and will require surveillance CT scans, scan in August showed stability of his disease.  Dr. Edwyna Ready continues to follow his scans.        There are no diagnoses linked to this encounter.      Patient is 3% hypertonic saline/ nebulized bronchodilator and steroids and it is needed for their bronchiectasis/COPD    Plan of care was discussed with the patient who acknowledged understanding and is in agreement.    Patient will return to clinic in 6 months or sooner if needed.    WJ:XBJYN Kishor Ronnell [place], Lee Friendly, MD    Maia Plan, MD  Assistant Professor, Pulmonary and Critical Care  Pager: [phone]  May 23, 2023 6:19 AM         I am located on-site and the patient is located on-site for this visit.

## 2023-05-28 MED ORDER — OXYCODONE-ACETAMINOPHEN 5 MG-325 MG TABLET
ORAL_TABLET | ORAL | 0 refills | 25 days | Status: CP | PRN
Start: 2023-05-28 — End: 2023-06-27

## 2023-06-05 NOTE — Unmapped (Signed)
 Pulmonary Clinic -follow-up visit    Referring Physician :  Marlyne Beards  PCP:     Deeann Cree, MD  Reason for Consult:   Advanced destructive emphysema/COPD    HISTORY:     History of Present Illness:  Lee Baxter is a 78 y.o. male with a history of COPD, hypertension, prior history of left upper lobe adenocarcinoma status post partial lobectomy followed by right lower lobe non-small cell lung cancer not pathologically confirmed status post radiation in November 2023, complicated by radiation pneumonitis status post receiving steroids in February 2024, history of GERD and CKD as well, whom we are seeing in consultation requested by Marlyne Beards for evaluation of  advanced obstructive emphysema/COPD .    Patient comes in here today with his daughter Lee Baxter, to establish care.    Significant medical history: Does not recollect having asthma as a child.  Smoked up to a pack a day for 35 years, quit 25 years ago.  However thereafter started chewing tobacco and she was a pack over 2 days.  Denies any vaping or any other inhalational products.  He was diagnosed with left upper lobe adenocarcinoma in the 1990s and underwent partial lobectomy.  During follow-up, he was found to have 2 nodules, in the right lower lobe, which were mildly PET avid, underwent a transbronchial biopsy however was not diagnostic.  With this being very concerning for cancer, he underwent radiation in November 2023.  Thereafter he developed complication from radiation and radiation pneumonitis receiving steroids in the month of February, with a weekly taper of 10 mg starting at 40 mg.  The empiric radiation was with 1000 cGy for 5 fractions, completed 09 February 2022.  Radiation pneumonitis was diagnosed in February 2024 with his symptoms being shortness of breath cough and phlegm.  He started requiring oxygen thereafter and now uses 2 L of oxygen at rest on exertion and at nighttime.    He was admitted in April 2024, due to similar symptoms of cough and shortness of breath when pulmonary was consulted.  A pleural effusion was also noted on the right side which was drained, cultures were negative, cytology negative for cancer.  Bronchoscopy with BAL was also performed with negative cultures, negative Fungitell, negative BAL galactomannan and negative cytology for malignancy.    Pulmonary was asked if he required no steroids at that time and due to the fact that he had completed a steroid taper, it was thought not necessary.  He was treated with a COPD exacerbation with a short course of steroids and antibiotics.  He was also given 3% saline during the hospitalization for clearance of his airways as he had developed traction bronchiectasis in his right lower lobe from the radiation pneumonitis.    Currently, he is on Trelegy Ellipta, does 1 puff every day.  Is not on any chronic steroids or azithromycin.    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  Since our last visit, we have done a few things.  A 6-minute walk test was done which showed that patient required 2 to 3 L oxygen on exertion.  Arterial blood gas was done which did not show hypercapnia.  Pulmonary function testing showed moderate obstruction with some reversibility, with gas trapping, and a severely low DLCO. We also had started the patient on Dupixent since her last visit.  Symptomatically, patient notes that he is doing a lot better than last time, very happy and cheerful, not on the wheelchair anymore and walked himself with a cane with his granddaughter.  He notes that the nebulizer treatment and the shots along with the inhaler have really helped his breathing symptoms and that he has clear mucus production now and not congested all the time.  He does not note any use of steroids or antibiotics in the last 3 months since my last visit.  The shortness of breath is still present however he says he is able to manage his activities better than before.  He also notes completing his pulmonary rehab however as his blood pressure increased he had to stop it.  He is getting his blood pressure managed better by his primary care doctor now.    Interval History 06/06/2023  -Continues to be on Trelegy and Dupixent.    -Patient is doing very well overall, is no longer using his oxygen during the day or nighttime.  Continues to use Trelegy once a day without any problems.  He feels that the Trelegy and the Dupixent together have really changed his life and he is able to do a lot more and exert a lot more and does not get short of breath as he used to.  -Denies any fevers or chills or cough or shortness of breath.        Past Medical History:  Past Medical History:   Diagnosis Date    Adenomatous colon polyp     Asthma     Cervical disc disorder 04/17/2013    cervical spine MRI from February 2014 discharge DJD and foraminal stenosis at C4- 5, C3- 4 and C6- 7.     Cervical stenosis of spinal canal 11/06/2019    COPD (chronic obstructive pulmonary disease) (CMS-HCC)     Diabetes mellitus (CMS-HCC) Dx 1990    Type II    Diverticulosis     Hemorrhoids, internal     HL (hearing loss)     Hypertension     Lung cancer (CMS-HCC) 1992    Pleural effusion, right 06/25/2022    PVD (posterior vitreous detachment), left eye     Sinusitis Temporomandibular joint disorders 01/15/2012     Past Surgical History:   Procedure Laterality Date    CATARACT EXTRACTION W/ INTRAOCULAR LENS IMPLANT Left 02/13/2006    Hecker MD    CATARACT EXTRACTION W/ INTRAOCULAR LENS IMPLANT Right 03/27/2006    Hecker MD    CHOLECYSTECTOMY      HERNIA REPAIR      KIDNEY STONE SURGERY      LUNG LOBECTOMY Left 1992    PR ALLOGRAFT FOR SPINE SURGERY ONLY MORSELIZED N/A 11/06/2019    Procedure: ALLOGRAFT FOR SPINE SURGERY ONLY; MORSELIZED;  Surgeon: Nemiah Commander, MD;  Location: [facility] OR [facility];  Service: Orthopedics    PR ARTHRD  PST/PSTLAT TQ 1NTRSPC CRV BELW C2 SEGMENT N/A 11/06/2019    Procedure: ARTHRODESIS, POSTERIOR OR POSTEROLATERAL TECHNIQUE, SINGLE LEVEL; CERVICAL BELOW C2 SEGMENT;  Surgeon: Nemiah Commander, MD;  Location: [facility] OR [facility];  Service: Orthopedics    PR ARTHRODESIS PST/PSTLAT TQ 1NTRSPC EA ADDL NTRSPC N/A 11/06/2019    Procedure: ARTHRODESIS, POSTERIOR OR POSTEROLATERAL TECHNIQUE, SINGLE LEVEL; EACH ADDITIONAL VERTEBRAL SEGMENT   x4;  Surgeon: Nemiah Commander, MD;  Location: [facility] OR [facility];  Service: Orthopedics    PR AUTOGRAFT SPINE SURGERY LOCAL FROM SAME INCISION N/A 11/06/2019    Procedure: AUTOGRAFT/SPINE SURG ONLY (W/HARVEST GRAFT); LOCAL (EG, RIB/SPINOUS PROC, LAM FRGMT) OBTAIN FROM SAME INCIS;  Surgeon: Nemiah Commander, MD;  Location: [facility] OR [facility];  Service: Orthopedics    PR BRNSCHSC TNDSC EBUS DX/TX INTERVENTION [facility] LES Right 11/21/2021    Procedure: BRONCH, RIGID OR FLEXIBLE, INCLUDING FLUORO GUIDANCE, WHEN PERFORMED; WITH TRANSENDOSCOPIC EBUS DURING BRONCHOSCOPIC DIAGNOSTIC OR THERAPEUTIC INTERVENTION(S) FOR PERIPHERAL LESION(S);  Surgeon: Jerelyn Charles, MD;  Location: MAIN OR [place];  Service: Pulmonary    PR BRONCHOSCOPY,COMPUTER ASSIST/IMAGE-GUIDED NAVIGATION N/A 11/21/2021    Procedure: ROBOT ION BRONCHOSCOPY,RIGID OR FLEXIBLE,INCLUDE FLUORO WHEN PERFORMED; W/COMPUTER-ASSIST,IMAGE-GUIDED NAVIGATION;  Surgeon: Jerelyn Charles, MD; Location: MAIN OR [place];  Service: Pulmonary    PR BRONCHOSCOPY,COMPUTER ASSIST/IMAGE-GUIDED NAVIGATION N/A 11/21/2021    Procedure: BRONCHOSCOPY, RIGID OR FLEXIBLE, INCLUDE FLUORO WHEN PERFORMED; W/COMPUTER-ASSIST, IMAGE-GUIDED NAVIGATION;  Surgeon: Jerelyn Charles, MD;  Location: MAIN OR [place];  Service: Pulmonary    PR BRONCHOSCOPY,DIAGNOSTIC W LAVAGE Right 06/27/2022    Procedure: BRONCHOSCOPY, RIGID OR FLEXIBLE, INCLUDE FLUOROSCOPIC GUIDANCE WHEN PERFORMED; W/BRONCHIAL ALVEOLAR LAVAGE;  Surgeon: Truett Mainland, MD;  Location: MAIN OR [place];  Service: Pulmonary    PR BRONCHOSCOPY,PLACEMENT FIDUCIAL MARKERS, 1/MULT N/A 11/21/2021    Procedure: BRONCHOSCOPY, RIGID OR FLEXIBLE, FLOURO WHEN PERFORMED; PLACEMENT OF FIDUCIAL MARKERS, SINGLE OR MULTIPLE;  Surgeon: Jerelyn Charles, MD;  Location: MAIN OR [place];  Service: Pulmonary    PR BRONCHOSCOPY,TRANSBRON ASPIR BX Right 11/21/2021    Procedure: BRONCHOSCOPY, RIGID/FLEX, INCL FLUORO; W/TRANSBRONCH NDL ASPIRAT BX, TRACHEA, MAIN STEM &/OR LOBAR BRONCHUS;  Surgeon: Jerelyn Charles, MD;  Location: MAIN OR [place];  Service: Pulmonary    PR BRONCHOSCOPY,TRANSBRONCH BIOPSY Right 11/21/2021    Procedure: BRONCHOSCOPY, RIGID/FLEXIBLE, INCLUDE FLUORO GUIDANCE WHEN PERFORMED; W/TRANSBRONCHIAL LUNG BX, SINGLE LOBE;  Surgeon: Jerelyn Charles, MD;  Location: MAIN OR [place];  Service: Pulmonary    PR C-LAMINOPLASTY W/GRAFT/PLATE, 2 OR MORE N/A 11/06/2019    Procedure: LAMINOPLASTY, CERVICAL, DECOMPRESS SPINAL CORD, 2+ VERTEBRAL SEGMENTS; W/RECONSTRUCT POSTERIOR BONY ELEMENT;  Surgeon: Nemiah Commander, MD;  Location: [facility] OR [facility];  Service: Orthopedics    PR COLONOSCOPY W/BIOPSY SINGLE/MULTIPLE N/A 07/05/2017    Procedure: COLONOSCOPY, FLEXIBLE, PROXIMAL TO SPLENIC FLEXURE; WITH BIOPSY, SINGLE OR MULTIPLE;  Surgeon: Rona Ravens, MD;  [facility];  Service: Gastroenterology    PR IONM 1 ON 1 IN OR W/ATTENDANCE EACH 15 MINUTES N/A 11/06/2019    Procedure: CONTINUOUS INTRAOPERATIVE NEUROPHYSIOLOGY MONITORING IN OR;  Surgeon: Nemiah Commander, MD;  Location: [facility] OR [facility];  Service: Orthopedics    PR LAM W/O FACETEC FORAMOT/DSKC 1/2 VRT SEG, CERVICAL N/A 11/06/2019    Procedure: LAMINECT W/EXPLOR WO FACETECT 1-2 VERTEB; CERV;  Surgeon: Nemiah Commander, MD;  Location: [facility] OR [facility];  Service: Orthopedics    PR POSTERIOR SEGMENTAL INSTRUMENTATION 3-6 VRT SEG N/A 11/06/2019    Procedure: POST SEGMT INSTRUM;  3 TO 6 VERTEB SEGMT CERVICAL;  Surgeon: Nemiah Commander, MD;  Location: [facility] OR [facility];  Service: Orthopedics    SINUS SURGERY         Other History:  The social history and family history were personally reviewed and updated in the patient's electronic medical record.    Family History   Problem Relation Age of Onset    Cancer Daughter     Strabismus Daughter     Hypertension Daughter     No Known Problems Mother     No Known Problems Father     No Known Problems Sister     No Known Problems Brother     No Known Problems Maternal Aunt     No Known Problems Maternal Uncle     No Known Problems Paternal Aunt     No Known Problems Paternal Uncle     No Known Problems Maternal Grandmother     No Known Problems Maternal Grandfather     No Known Problems Paternal Grandmother     No Known Problems Paternal Grandfather     Amblyopia Neg Hx     Blindness Neg Hx     Cataracts Neg Hx     Diabetes Neg Hx     Glaucoma Neg Hx     Macular degeneration Neg Hx     Retinal detachment Neg Hx     Stroke Neg Hx     Thyroid disease Neg Hx      Social History     Socioeconomic History    Marital status: Widowed   Tobacco Use    Smoking status: Former     Current packs/day: 0.00     Average packs/day: 1 pack/day for 35.0 years (35.0 ttl pk-yrs)     Types: Cigarettes     Start date: 02/19/1953     Quit date: 08/12/1987     Years since quitting: 35.8    Smokeless tobacco: Current     Types: Chew   Vaping Use    Vaping status: Never Used   Substance and Sexual Activity Alcohol use: No     Alcohol/week: 0.0 standard drinks of alcohol    Drug use: No   Social History Narrative    Lives w/ two granddaughters (2 and 8 yrs old). Is a widower. Was married 66yrs. Used to work in Holiday representative. His daughter buys his groceries but he cooks and pays bills. Former smoker, smoked for 35 yrs, quit 20 yrs ago. Has been cheewing tobacco since.     Social Drivers of Psychologist, prison and probation services Strain: Low Risk  (06/28/2022)    Overall Financial Resource Strain (CARDIA)     Difficulty of Paying Living Expenses: Not hard at all   Food Insecurity: No Food Insecurity (06/28/2022)    Hunger Vital Sign     Worried About Running Out of Food in the Last Year: Never true     Ran Out of Food in the Last Year: Never true   Transportation Needs: No Transportation Needs (06/28/2022)    PRAPARE - Therapist, art (Medical): No     Lack of Transportation (Non-Medical): No   Housing: Low Risk  (06/28/2022)    Housing     Within the past 12 months, have you ever stayed: outside, in a car, in a tent, in an overnight shelter, or temporarily in someone else's home (i.e. couch-surfing)?: No     Are you worried  about losing your housing?: No       Home Medications:  Current Outpatient Medications on File Prior to Visit   Medication Sig Dispense Refill    albuterol 2.5 mg /3 mL (0.083 %) nebulizer solution Inhale 3 mL (2.5 mg total) by nebulization every four (4) hours as needed for wheezing or shortness of breath. 60 mL 2    albuterol HFA 90 mcg/actuation inhaler TAKE 2 PUFFS BY MOUTH EVERY 6 HOURS AS NEEDED FOR WHEEZE 8 g 5    atorvastatin (LIPITOR) 40 MG tablet TAKE 1 TABLET BY MOUTH EVERY DAY IN THE EVENING 90 tablet 3    blood-glucose meter kit Disp. blood glucose meter kit preferred by patient's insurance. Dx: Diabetes, E11.9 1 each 11    dupilumab 300 mg/2 mL PnIj Inject the contents of 1 pen (300 mg) under the skin every fourteen (14) days. 4 mL 11    empty container Misc Use as directed to dispose of Dupixent pens 1 each 2    EPINEPHrine (EPIPEN) 0.3 mg/0.3 mL injection Inject 0.3 mL (0.3 mg total) into the muscle once as needed for anaphylaxis for up to 1 dose. 1 each 0    finasteride (PROSCAR) 5 mg tablet Take 1 tablet (5 mg total) by mouth daily. 90 tablet 3    fluticasone propionate (FLONASE) 50 mcg/actuation nasal spray 2 sprays into each nostril daily. 48 mL 7    ipratropium-albuterol (DUO-NEB) 0.5-2.5 mg/3 mL nebulizer Inhale 1 vial ( 3 mL) by nebulization every six (6) hours. Use as directed 1080 mL 1    lisinopril (PRINIVIL,ZESTRIL) 10 MG tablet Take 1 tablet (10 mg total) by mouth daily. 90 tablet 3    MEDICAL SUPPLY ITEM Pt has DM hx of neuropathy w/ callus. Followed for comprehensive care. For med records, fax release to [phone]. 2 Units 0    MEDICAL SUPPLY ITEM 1 pair of diabetic shoes and 3 pairs of inserts.  Pt has DM with neuropathy w/ callus. Followed for comprehensive care. For med records, fax release to [phone] 2 each 0    metFORMIN (GLUCOPHAGE) 500 MG tablet TAKE 1 TABLET (500MG  TOTAL) BY MOUTH IN THE MORNING AND TAKE 1 TABLET IN THE EVENING WITH MEALS 180 tablet 3    ofloxacin (OCUFLOX) 0.3 % ophthalmic solution APPLY 2 DROPS (OPHTHALMIC (EYE)) 4 TIMES PER DAY FOR 5 DAYS      oxyCODONE-acetaminophen (PERCOCET) 5-325 mg per tablet Take 1 tablet by mouth every four (4) hours as needed for pain. 150 tablet 0    oxyCODONE-acetaminophen (PERCOCET) 5-325 mg per tablet Take 1 tablet by mouth every four (4) hours as needed for pain. 150 tablet 0    [START ON 06/29/2023] oxyCODONE-acetaminophen (PERCOCET) 5-325 mg per tablet Take 1 tablet by mouth every four (4) hours as needed for pain. 150 tablet 0    sodium chloride 3 % NEBULIZER solution Inhale 1 vial ( 4 mL) by nebulization two (2) times a day. 720 mL 3    tamsulosin (FLOMAX) 0.4 mg capsule TAKE 1 CAPSULE BY MOUTH EVERY DAY 90 capsule 3    TRELEGY ELLIPTA 200-62.5-25 mcg DsDv INHALE 1 PUFF IN THE MORNING 60 each 5     No current facility-administered medications on file prior to visit.       Allergies:  Allergies as of 06/06/2023 - Reviewed 04/30/2023   Allergen Reaction Noted    Latex  06/26/2022       Review of Systems:  A comprehensive review of systems was  completed and negative except as noted in HPI.    PHYSICAL EXAM:   BP 124/53 (BP Site: L Arm, BP Position: Sitting, BP Cuff Size: Medium)  - Pulse 93  - Temp 36.2 ??C (97.2 ??F) (Temporal)  - Wt 60.1 kg (132 lb 6.4 oz)  - SpO2 97%  - BMI 22.03 kg/m??       Gen: Patient is an elderly male, presenting today by himself without his family member, not on any oxygen awake, alert, oriented to time place and person, no pallor, icterus,  cyanosis, mild clubbing.  Eyes: Pupils equal round and reacting light bilaterally  Head neck ENT: No JVD, no lymphadenopathy-cervical or supraclavicular, no thyromegaly, moist mucosa, normal oropharynx  CVS: S1-S2 heard normally, no murmurs appreciated, no rubs or gallops  Respiratory: Air entry reduced bilaterally with coarse breath sounds at the right base  Abdomen: Soft, nontender, nondistended, no organomegaly, bowel sounds present  Neuro: Nonfocal neurological exam, no sensory deficits, cranial nerves grossly intact  Skin extremities: No rash, no pedal edema     LABORATORY and RADIOLOGY DATA:     Pulmonary Function Tests/Interpretation:      7/5/024:            Pertinent Laboratory Data:     Latest Reference Range & Units 08/23/22 12:29   Aspergillus fumigatus IgE <0.35 kUA/L 1.26 (H)   Fusarium Proliferatum IgE <0.35 kUA/L 0.37 (H)   German Cockroach IgE <0.35 kUA/L 0.41 (H)   P chrysogenum (P notatum) IgE <0.35 kUA/L 0.53 (H)   Trichophyton rubrum IgE <0.35 kUA/L 2.69 (H)   Total IgE 3 - 48 IU/mL 787 (H)   A. fumigatus IgE <0.35 kU/L 1.22 (H)   (H): Data is abnormally high     Latest Reference Range & Units 08/23/22 12:29   Absolute Eosinophils 0.0 - 0.5 10*9/L 0.3        Latest Reference Range & Units 09/15/22 10:08   FIO2 Arterial  Room Air   pH, Arterial 7.35 - 7.45  7.45   pCO2, Arterial 35.0 - 45.0 mm Hg 37.2   pO2, Arterial 80.0 - 110.0 mm Hg 60.2 (L)   HCO3 Art 22 - 27 mmol/L 26   Base Excess, Arterial -2.0 - 2.0  1.6   O2 Sat, Arterial 94.0 - 100.0 % 92.1 (L)   Specimen Source  Arterial, left radial   (L): Data is abnormally low      Lab Results   Component Value Date    WBC 8.7 08/23/2022    HGB 11.9 (L) 08/23/2022    HCT 35.4 (L) 08/23/2022    PLT 384 08/23/2022       Lab Results   Component Value Date    NA 137 08/23/2022    K 5.0 (H) 08/23/2022    CL 101 08/23/2022    CO2 30.2 08/23/2022    BUN 24 (H) 08/23/2022    CREATININE 1.37 (H) 08/23/2022    GLU 204 (H) 08/23/2022    CALCIUM 9.5 08/23/2022    MG 1.6 07/17/2022    PHOS 2.3 (L) 07/17/2022       Lab Results   Component Value Date    BILITOT 0.4 08/23/2022    PROT 6.5 08/23/2022    ALBUMIN 3.1 (L) 08/23/2022    ALT 10 08/23/2022    AST 16 08/23/2022    ALKPHOS 91 08/23/2022    GGT 19 01/11/2012       Lab Results   Component Value Date  INR 1.07 10/20/2019    APTT 35.1 10/20/2019      Latest Reference Range & Units 06/30/20 20:40 09/30/21 11:57 04/19/22 11:58   Absolute Eosinophils 0.0 - 0.5 10*9/L 0.4 0.3 0.2     AFB smear and culture: 4/16-17/24: negative (pleural and bronchial)  Bronchial and pleural fluid culture: no growth  Fungal: negative  BAL GAL <0.5  Fungitell <31  RPP negative    A. Pleural fluid , cytology:  - Negative for malignant cells  - Reactive mesothelial cells, macrophages and chronic inflammation    BAL, right middle lobe, cytology:  - No cytologically malignant cells seen  - Abundant macrophages and mixed inflammatory cells    Pertinent Imaging Data:      CT chest 05/15/2023:  1. Anticipated changes associated radiation therapy in the lower lobe of the right lung without findings to suggest locally recurrent or new neoplastic disease within the thorax.      2. Improved, near resolution of a small focus of consolidation in the upper lobe of the right lung evident on the comparison exam from November 2024 (image 49 series 6)      3. Notable chronic findings include:   - Advanced obstructive changes of centrilobular emphysema   - Calcified atherosclerotic disease within the coronary arterial distribution (heavy)     CT chest 02/02/2023:  Post treatment changes of the right lower lobe without evidence of local recurrence.      New clustered nodularity in the anterior right upper lobe which can be infectious or inflammatory, however, follow-up CT chest in 4 to 6 weeks is recommended to assess change and ensure resolution.      Advanced destructive centrilobular emphysema.         CT chest 10/26/2022:  Impression      1. No findings to suggest progression of neoplastic disease involving the thorax      2. No findings of infectious pneumonia or other acute pulmonary process      3. Notable chronic findings include:   - Advanced destructive centrilobular emphysema   - Calcified atherosclerotic disease within the coronary arterial distribution         Echo 06/16/22: Summary    1. The left ventricle is relatively small in size with upper normal wall  thickness.    2. The left ventricular systolic function is normal, LVEF is visually  estimated at > 55%.    3. The right ventricle is normal in size, with normal systolic function.       CT chest: 07/25/22:   Impression      --Persistent but improved right middle and lower lobe airspace disease likely related to radiation therapy with nonvisualization of previously treated lesions.      --Increased left lower lobe airspace disease which could be treatment related or secondary to superimposed infection.      --Interval decrease in size of right apical nodule.      --Small right pleural effusion.         CTA 06/25/22:   Impression      * No acute pulmonary emboli.   * Increased right middle and right lower lobe airspace disease which could be treatment related or secondary to superimposed pneumonia or aspiration given airway debris.   * Scattered new irregular nodular opacities in both lungs are likely infectious/inflammatory in etiology with neoplasm considered less likely. Attention on follow-up imaging is recommended.   * Increased size of small right pleural effusion.   * Advanced  destructive emphysema.       CT 04/25/22:   Impression      1. New extensive consolidation in the lower lobe of the right lung like reflects radiation therapy related pneumonitis. Underlying treated lesion cannot be differentiated from the surrounding consolidation.      2. New single irregular nodular lesions in the apices of both lungs likely relate to benign change. However, consider CT imaging surveillance in 2-3 months to ensure resolution.      3. Calcified atherosclerotic disease within the coronary arterial distribution (heavy)       CT 11/10/21: Extensive centrilobular emphysema and mild bronchial wall thickening consistent with COPD. Postsurgical changes of left upper lobectomy. Stable 2.9 cm and 2.5 cm groundglass opacities within the emphysema in the right lower lobe (series 4, image 49, 42). Scattered calcified granuloma in the right upper and right lower lobes. Central airways are patent. No pleural effusion or pneumothorax.         ASSESSMENT and PLAN     Lee Baxter is a 78 y.o. male with history of COPD, hypertension, prior history of left upper lobe adenocarcinoma status post partial lobectomy followed by right lower lobe non-small cell lung cancer not pathologically confirmed status post radiation in November 2023, complicated by radiation pneumonitis status post receiving steroids in February 2024, history of GERD and CKD as well Is here for management and optimization of his COPD and radiation pneumonitis related bronchiectatic changes in the right lower lobe.    Overall, patient has advanced destructive emphysema on his CT scan, and clinically he has COPD Gold stage IIE,  He was on 2-3 L oxygen based on his formal 6-minute evaluation for daytime oxygen.  He also used the same amount of oxygen at nighttime.     Since last visit, patient notes that he is doing a lot better overall symptomatically  with Trelegy Ellipta as well as the Dupixent shots.    He has tolerated Dupixent very well and has not noted any side effects from the same.  His daughter gives him the shot at home without any difficulty.    Since then, he does a lot of physical activity than he used to before, has stopped using oxygen during the day and nighttime.  Although he did try pulmonary rehab in the past but due to elevated blood pressures he could not complete it.    I have asked him to start using oxygen at nighttime 2 L given his severe COPD and low DLCO he would be hypoxemic at nighttime.  He wants to avoid doing an overnight oximetry at this stage.    I have also counseled him to start doing pulmonary rehab again.  Referral placed again.       Eosinophilic Asthma and COPD, advanced obstructive emphysema: Gold COPD 2E, along with traction bronchiectasis mostly in the right lower lobe in the setting of radiation pneumonitis and mucous plugging  -Continue Trelegy Ellipta  - patient stopped doing airway clearance with DuoNeb followed by 3% saline twice a day - ok to hold for now  -Continue Dupixent bimonthly.  -Genotype for alpha-1 antitrypsin - MM  -Overall, patient doing really well and therefore has had better ability to perform his day-to-day activities.  I will continue the same regimen as above and follow-up in 6 months.      Oxygen Requirement  -Patient is currently not on 2 L oxygen at rest, on exertion and at night.  I have asked him to restart  his 2 L oxygen at nighttime and monitor his saturations during daytime on exertion.    Chronic NIV:  -No hypercapnia noted on ABG    Chronic steroid and macrolide requirement:  -Currently the patient does not require either, will reassess at next visit    Health Maintenance:    Most Recent Immunizations   Administered Date(s) Administered    COVID-19 VAC,BIVALENT(80YR UP),PFIZER 12/07/2020    COVID-19 VACC,MRNA,(PFIZER)(PF) 12/10/2019    COVID-19 VACCINE,MRNA(MODERNA)(PF) 07/12/2020    INFLUENZA IIV3 HIGH DOSE 72YRS+(FLUZONE) 01/22/2023    INFLUENZA QUAD ADJUVANTED 88YR UP(FLUAD) 11/26/2020    INFLUENZA QUAD HIGH DOSE 72YRS+(FLUZONE) 11/04/2018    INFLUENZA TIV (TRI) PF (IM)(HISTORICAL) 11/23/2009    Influenza Vaccine Quad(IM)6 MO-Adult(PF) 05/08/2022    Influenza Virus Vaccine, unspecified formulation 11/26/2019    PNEUMOCOCCAL POLYSACCHARIDE 23-VALENT 08/30/2012    Pneumococcal Conjugate 13-Valent 11/20/2013    Td (adult) unspecified formulation 07/17/2003    TdaP 02/23/2012    ZOSTAVAX - ZOSTER VACCINE, LIVE, SQ 02/19/2014       Smoking Cessation:  Patient is currently not smoking cigarettes but he is chewing tobacco for the last 30 years.  I counseled him that if it was only a matter of having something in his mouth as he did not want to do candy chewing because of diabetes, there are sugar-free candies available that he can suck on and try to quit chewing tobacco.  I told him that chewing tobacco is also very high risk for causing oral cancer, lung cancer as well as pancreatic and stomach cancer.    Pulmonary Rehab:  Will retry pulmonary rehab, order placed for [place].  Last time he could not complete due to elevated blood pressures which seem to be a lot better controlled now.     Lung Cancer Screening  The USPSTF recommends annual screening for lung cancer with low-dose computed tomography in adults ages 29 to 36 years who have a 20 pack-year smoking history and currently smoke or have quit within the past 15 years. Screening should be discontinued once a person has not smoked for 15 years or develops a health problem that substantially limits life expectancy or the ability or willingness to have curative lung surgery.    Lee Baxter does not meet criteria for lung cancer screening according to the USPSTF guidelines as he already has lung cancer and is treated for it and will require surveillance CT scans, scan in August showed stability of his disease as well as scan in March 2025.  Next scan in June 2025..  Dr. Edwyna Ready continues to follow his scans.        Diagnoses and all orders for this visit:    COPD, severe  -     Ambulatory referral to [facility]; Future    Eosinophilic asthma  -     Ambulatory referral to [facility]; Future    Asthma-COPD overlap syndrome (CMS-HCC)  -     Ambulatory referral to [facility]; Future    Bronchiectasis without complication          Patient is nebulized bronchodilator and steroids and it is needed for their bronchiectasis/COPD    Plan of care was discussed with the patient who acknowledged understanding and is in agreement.    Patient will return to clinic in 6 months or sooner if needed.    ZO:XWRUE Kishor Ronnell [place], Alma Friendly, MD    Maia Plan, MD  Assistant Professor, Pulmonary and Critical Care  Pager: [phone]  June 06, 2023 10:50 AM           I am located on-site and the patient is located on-site for this visit.

## 2023-06-06 ENCOUNTER — Ambulatory Visit: Admit: 2023-06-06 | Discharge: 2023-06-07 | Payer: MEDICARE

## 2023-06-06 DIAGNOSIS — J479 Bronchiectasis, uncomplicated: Principal | ICD-10-CM

## 2023-06-06 DIAGNOSIS — J4489 Asthma-COPD overlap syndrome: Principal | ICD-10-CM

## 2023-06-06 DIAGNOSIS — J449 Chronic obstructive pulmonary disease, unspecified: Principal | ICD-10-CM

## 2023-06-06 DIAGNOSIS — J8283 Eosinophilic asthma: Principal | ICD-10-CM

## 2023-06-06 NOTE — Unmapped (Addendum)
 You were seen in clinic today by Dr Marcos Eke       Please call the pulmonary rehab center tomorrow : [facility] [address] [place], [place] [address] [phone]     Continue trelegy once a day    Continue dupixent shots    Lets meet again in 6 months    We will call you with abnormal results if any labs or imaging ordered. We would encourage you to use MyChart.  Please call the clinic if you have any further questions or call 911 for emergency.  Thank you for giving me the opportunity to manage your health.    If a sleep study was ordered call [facility] [phone]     If sleep clinic referral was made, call [facility] SLEEP/Neurology clinic  [phone] to schedule this appointment.    If any imaging studies were ordered, if you are not contacted by the schedulers in 7-10 days, I would recommend calling [phone] to reach the imaging schedulers.    [facility] [phone]  or [phone]    [facility] CARE specialists: [phone]     Between appointments, you can reach [place] at these numbers:    For appointments or the Pulmonary Nurse: Call Selinda Eon, COPD Clinical nurse: [phone])     [phone]  Fax: [phone] / [phone]    For nurse line call [phone] and tell them you are my patient.    For urgent issues after hours:    Hospital Operator: [phone], ask for Pulmonary Fellow on call        I don't have a MyChart. Why should I get one?   - It's encrypted, so your information is secure  - It's a quick, easy way to contact the care team, manage appointments, see test results, and more!    How do I sign-up for MyChart?   - Download the MyChart app from the [employer] or [employer] and sign-up in the app  - Sign-up online at [email or web address]        Please note that if your next visit is scheduled after September 11, 2023, we may need to contact you to adjust your visit date/time if the doctor's schedule changes. We will do this as far in advance as possible to avoid any inconvenience on your schedule.      Your COPD Action Plan      Usual activity and exercise level. I sometimes have trouble breathing that improves with my medications (rescue inhaler or nebulizer). Usual amounts of cough and phlegm/mucus. Sleeping well at night.    Action:   Take below medications  Avoid cigarette smoke, inhaled irritants, and sick contacts  Use oxygen (if prescribed)  Continue regular exercise/diet plan    Your scheduled COPD medication is:  Trelegy DPI (fluticasone/umeclidinium/vilanterol)    Your COPD medication to take as needed if you become short of breath is:  Albuterol MDI (ProAir, Proventil, Ventolin, Xopenex)         I have any of the following complaints:  My cough is more frequent or severe  I am more short of breath than usual  I have more or thicker/greener mucus    Action:  Call Harriett Sine in pulmonary office [phone]) to discuss possible treatment with antibiotics or steroids.    If your breathing is not improved after 2-3 days or your symptoms worsen, call Harriett Sine for a reassessment. If it the weekend or holiday,  call the hospital operator [phone]) and ask for the Pulmonary Fellow On Call        I have any of the following:   Very short of breath even at rest  Confusion or difficulty staying awake  Coughing up blood  Chest pain  New or worsening leg swelling   Unable to eat or take care of myself    Action:   CALL 911 or have someone drive you to the closest emergency department.  Bring your medicines with you  Use your rescue inhaler while seeking help every 2 hours if needed  Use oxygen (if prescribed)         Chronic Obstructive Pulmonary Disease (COPD): Care Instructions  Your Care Instructions     Chronic obstructive pulmonary disease (COPD) is a general term for a group of lung diseases, including emphysema and chronic bronchitis. People with COPD have decreased airflow in and out of the lungs, which makes it hard to breathe. The airways also can get clogged with thick mucus. Cigarette smoking is a major cause of COPD.  Although there is no cure for COPD, you can slow its progress. Following your treatment plan and taking care of yourself can help you feel better and live longer.  Follow-up care is a key part of your treatment and safety. Be sure to make and go to all appointments, and call your doctor if you are having problems. It???s also a good idea to know your test results and keep a list of the medicines you take.  How can you care for yourself at home?  Staying healthy  Do not smoke. This is the most important step you can take to prevent more damage to your lungs. If you need help quitting, talk to your doctor about stop-smoking programs and medicines. These can increase your chances of quitting for good.  Avoid colds and flu. Get a pneumococcal vaccine shot. If you have had one before, ask your doctor whether you need a second dose. Get the flu vaccine every fall. If you must be around people with colds or the flu, wash your hands often.  Avoid secondhand smoke, air pollution, and high altitudes. Also avoid cold, dry air and hot, humid air. Stay at home with your windows closed when air pollution is bad.  Medicines and oxygen therapy  Take your medicines exactly as prescribed. Call your doctor if you think you are having a problem with your medicine.  You may be taking medicines such as:  Bronchodilators. These help open your airways and make breathing easier. Bronchodilators are either short-acting (work for 6 to 9 hours) or long-acting (work for 24 hours). You inhale most bronchodilators, so they start to act quickly. Always carry your quick-relief inhaler with you in case you need it while you are away from home.  Corticosteroids (prednisone, budesonide). These reduce airway inflammation. They come in pill or inhaled form. You must take these medicines every day for them to work well.  A spacer may help you get more inhaled medicine to your lungs. Ask your doctor or pharmacist if a spacer is right for you. If it is, ask how to use it properly.  Do not take any vitamins, over-the-counter medicine, or herbal products without talking to your doctor first.  If your doctor prescribed antibiotics, take them as directed. Do not stop taking them just because you feel better. You need to take the full course of antibiotics.  Oxygen therapy boosts the amount of oxygen in your  blood and helps you breathe easier. Use the flow rate your doctor has recommended, and do not change it without talking to your doctor first.  Activity  Get regular exercise. Walking is an easy way to get exercise. Start out slowly, and walk a little more each day.  Pay attention to your breathing. You are exercising too hard if you cannot talk while you are exercising.  Take short rest breaks when doing household chores and other activities.  Learn breathing methods--such as breathing through pursed lips--to help you become less short of breath.  If your doctor has not set you up with a pulmonary rehabilitation program, talk to him or her about whether rehab is right for you. Rehab includes exercise programs, education about your disease and how to manage it, help with diet and other changes, and emotional support.  Diet  Eat regular, healthy meals. Use bronchodilators about 1 hour before you eat to make it easier to eat. Eat several small meals instead of three large ones. Drink beverages at the end of the meal. Avoid foods that are hard to chew.  Eat foods that contain fat and protein so that you do not lose weight and muscle mass. These foods include ice cream, pudding, cheese, eggs, and peanut butter.  Use less salt. Too much salt can cause you to retain fluids, which makes it harder to breathe. Do not add salt while you are cooking or at the table. Eat fewer processed foods and foods from restaurants, including fast foods. Use fresh or frozen foods instead of canned foods.  Mental health  Talk to your family, friends, or a therapist about your feelings. It is normal to feel frightened, angry, hopeless, helpless, and even guilty. Talking openly about bad feelings can help you cope. If these feelings last, talk to your doctor.  When should you call for help?  Call 911 anytime you think you may need emergency care. For example, call if:  You have severe trouble breathing.  Call your doctor now or seek immediate medical care if:  You have new or worse trouble breathing.  You cough up blood.  You have a fever.  Watch closely for changes in your health, and be sure to contact your doctor if:  You cough more deeply or more often, especially if you notice more mucus or a change in the color of your mucus.  You have new or worse swelling in your legs or belly.  You are not getting better as expected.  Where can you learn more?  Go to CurvePoint.com.pt  ?? 2006-2016 Healthwise, Incorporated. Care instructions adapted under license by [facility]. This care instruction is for use with your licensed healthcare professional. If you have questions about a medical condition or this instruction, always ask your healthcare professional. Healthwise, Incorporated disclaims any warranty or liability for your use of this information.  Content Version: 10.9.538570; Current as of: October 31, 2013     COPD Exacerbation Plan: Care Instructions     Your Care Instructions   If you have chronic obstructive pulmonary disease (COPD), your usual shortness of breath could suddenly get worse. You may start coughing more and have more mucus. This flare-up is called a COPD exacerbation (say [id number]-ZAS-[id number]-BAY-[id number]).   A lung infection or air pollution could set off an exacerbation. Sometimes it can happen after a quick change in temperature or being around chemicals.   Work with your doctor to make a plan for dealing with an exacerbation. You can  better manage it if you plan ahead.     Follow-up care is a key part of your treatment and safety. Be sure to make and go to all appointments, and call your doctor if you are having problems. It's also a good idea to know your test results and keep a list of the medicines you take.     How can you care for yourself at home?   During an exacerbation   Do not panic if you start to have one. Quick treatment at home may help you prevent serious breathing problems. If you have a COPD exacerbation plan that you developed with your doctor, follow it.   Take your medicines exactly as your doctor tells you.   Use your inhaler as directed by your doctor. If your symptoms do not get better after you use your medicine, have someone take you to the emergency room. Call an ambulance if necessary.   With inhaled medicines, a spacer or a nebulizer may help you get more medicine to your lungs. Ask your doctor or pharmacist how to use them properly. Practice using the spacer in front of a mirror before you have an exacerbation. This may help you get the medicine into your lungs quickly.   If your doctor has given you steroid pills, take them as directed.   Your doctor may have given you a prescription for antibiotics, which you are to fill if you need it. Call your doctor if you use the prescription.   Talk to your doctor if you have any problems with your medicine.    Preventing an exacerbation   Do not smoke. This is the most important step you can take to prevent more damage to your lungs and prevent problems. If you already smoke, it is never too late to stop. If you need help quitting, talk to your doctor about stop-smoking programs and medicines. These can increase your chances of quitting for good.   Take your daily medicines as prescribed.   Avoid colds and flu.   Get a pneumococcal vaccine.   Get a flu vaccine each year, as soon as it is available. Ask those you live or work with to do the same, so they will not get the flu and infect you.   Try to stay away from people with colds or the flu.   Wash your hands often.  Avoid secondhand smoke; air pollution; cold, dry air; hot, humid air; and high altitudes. Stay at home with your windows closed when air pollution is bad.   Learn breathing techniques for COPD, such as breathing through pursed lips. These techniques can help you breathe easier during an exacerbation.    When should you call for help?   Call 911 anytime you think you may need emergency care. For example, call if:   You have severe trouble breathing.   You have severe chest pain.  Call your doctor now or seek immediate medical care if:   You have new or worse shortness of breath.   You develop new chest pain.   You are coughing more deeply or more often, especially if you notice more mucus or a change in the color of your mucus.   You cough up blood.   You have new or increased swelling in your legs or belly.   You have a fever.  Watch closely for changes in your health, and be sure to contact your doctor if:   You use your antibiotic prescription.  Your symptoms are getting worse.    Where can you learn more?   Go to https://myuncchart.org   Enter [phone] [phone] in the search box to learn more about COPD Exacerbation Plan: Care Instructions.   ?? 2006-2016 Healthwise, Incorporated. Care instructions adapted under license by [facility]. This care instruction is for use with your licensed healthcare professional. If you have questions about a medical condition or this instruction, always ask your healthcare professional. Healthwise, Incorporated disclaims any warranty or liability for your use of this information.   Content Version: 10.9.538570; Current as of: October 31, 2013

## 2023-06-11 NOTE — Unmapped (Signed)
 ORDER FOR PULMONARY REHAB FAXED TO [facility], ALSO SENT ELECTRONICALLY.

## 2023-06-20 NOTE — Unmapped (Signed)
 [facility] Refill Coordination Note    Specialty Medication(s) to be Shipped:   CF/Pulmonary/Asthma: Dupixent    Other medication(s) to be shipped: No additional medications requested for fill at this time     Lee Baxter, DOB: [birth year]  Phone: [phone] (home) [phone] (work)      All above HIPAA information was verified with patient's family member, daughter.     Was a Nurse, learning disability used for this call? No    Completed refill call assessment today to schedule patient's medication shipment from the [facility]  [phone]).  All relevant notes have been reviewed.     Specialty medication(s) and dose(s) confirmed: Regimen is correct and unchanged.   Changes to medications: Froylan reports no changes at this time.  Changes to insurance: No  New side effects reported not previously addressed with a pharmacist or physician: None reported  Questions for the pharmacist: No    Confirmed patient received a Conservation officer, [name], mining with first shipment. The patient will receive a drug information handout for each medication shipped and additional FDA Medication Guides as required.       DISEASE/MEDICATION-SPECIFIC INFORMATION        For patients on injectable medications: Patient currently has 0 doses left.  Next injection is scheduled for 04/23.    SPECIALTY MEDICATION ADHERENCE     Medication Adherence    Patient reported X missed doses in the last month: 0  Specialty Medication: DUPIXENT PEN 300 mg/2 mL Pnij (dupilumab)  Patient is on additional specialty medications: No              Were doses missed due to medication being on hold? No    DUPIXENT PEN 300 mg/2 mL Pnij (dupilumab): 0 doses of medicine on hand       REFERRAL TO PHARMACIST     Referral to the pharmacist: Not needed      [facility]     Shipping address confirmed in Epic.     Cost and Payment: Patient has a $0 copay, payment information is not required.    Delivery Scheduled: Yes, Expected medication delivery date: 06/26/23.     Medication will be delivered via UPS to the prescription address in Epic WAM.    Alania Overholt   [facility]  Specialty Technician

## 2023-06-25 MED FILL — DUPIXENT 300 MG/2 ML SUBCUTANEOUS PEN INJECTOR: SUBCUTANEOUS | 28 days supply | Qty: 4 | Fill #9

## 2023-06-29 MED ORDER — OXYCODONE-ACETAMINOPHEN 5 MG-325 MG TABLET
ORAL_TABLET | ORAL | 0 refills | 25 days | Status: CP | PRN
Start: 2023-06-29 — End: 2023-07-29

## 2023-07-02 ENCOUNTER — Ambulatory Visit: Admit: 2023-07-02 | Payer: Medicare (Managed Care)

## 2023-07-02 NOTE — Unmapped (Unsigned)
Orientation to Cardiac/Pulmonary rehab program        The patient has been oriented to the following:  Waiting room, Bathrooms, Towels, Appropriate clothing, Availability of water, staff offices, education classes daily and materials, Fire exit, Emergency procedures, Telemetry, daily weights, Cancellation policy-such as inclement weather-or patient planned absences, activity guidelines and precautions, Tour of the facility, Patient informed to report to staff if they have symptoms- such as chest pain, pain anywhere, and or dizziness, Patient has been informed of RPE scale, and THRR, Patient has been informed when BP, HR and or (SA02 done for PR) .  Above has been reviewed with patient and allowed patient to ask questions.     Patient has signed and witnessed consents to participate in the program, privacy waiver, attendance policy,vocational rehab form for CR only.  Patient  has been provided with an orientation folder that includes a patient rights, welcome letter, sanitize handout, and phone numbers for home reference.    ORIENTATION TO GYM EQUIPMENT:  Treadmill: How to get on and off, safety clip, etc.  Arm Egometer: Adjust height for comfort, Avoid excess speed, increase and decrease tension.  Stationary Bike/Recumbent Bike: Adjust seat height, proper settings indicated by staff, exercising arm and legs or rest. Dumbbells: Use only prescribed pounds, use in open area with sufficient space, perform exercises as instructed by staff, replace securely in track when finished.  Bands: Use slow moderate pace, keep back straight. Elliplitical/seated Ellipitical:How to get onto machine, position seat properly, hold onto handle as you step up onto machine, proper settings indicated by staff.

## 2023-07-20 NOTE — Unmapped (Signed)
 [facility] Clinical Assessment & Refill Coordination Note    Lee Baxter, DOB: [birth year]  Phone: [phone] (home) [phone] (work)    All above HIPAA information was verified with patient's family member, daughter Lee Baxter).     Was a Nurse, learning disability used for this call? No    Specialty Medication(s):   CF/Pulmonary/Asthma: Dupixent     Current Outpatient Medications   Medication Sig Dispense Refill    albuterol 2.5 mg /3 mL (0.083 %) nebulizer solution Inhale 3 mL (2.5 mg total) by nebulization every four (4) hours as needed for wheezing or shortness of breath. 60 mL 2    albuterol HFA 90 mcg/actuation inhaler TAKE 2 PUFFS BY MOUTH EVERY 6 HOURS AS NEEDED FOR WHEEZE 8 g 5    atorvastatin (LIPITOR) 40 MG tablet TAKE 1 TABLET BY MOUTH EVERY DAY IN THE EVENING 90 tablet 3    blood-glucose meter kit Disp. blood glucose meter kit preferred by patient's insurance. Dx: Diabetes, E11.9 1 each 11    dupilumab (DUPIXENT) 300 mg/2 mL pen injector Inject the contents of 1 pen (300 mg) under the skin every fourteen (14) days. 4 mL 11    empty container Misc Use as directed to dispose of Dupixent pens 1 each 2    EPINEPHrine (EPIPEN) 0.3 mg/0.3 mL injection Inject 0.3 mL (0.3 mg total) into the muscle once as needed for anaphylaxis for up to 1 dose. 1 each 0    finasteride (PROSCAR) 5 mg tablet Take 1 tablet (5 mg total) by mouth daily. 90 tablet 3    fluticasone propionate (FLONASE) 50 mcg/actuation nasal spray 2 sprays into each nostril daily. 48 mL 7    ipratropium-albuterol (DUO-NEB) 0.5-2.5 mg/3 mL nebulizer Inhale 1 vial ( 3 mL) by nebulization every six (6) hours. Use as directed 1080 mL 1    lisinopril (PRINIVIL,ZESTRIL) 10 MG tablet Take 1 tablet (10 mg total) by mouth daily. 90 tablet 3    MEDICAL SUPPLY ITEM Pt has DM hx of neuropathy w/ callus. Followed for comprehensive care. For med records, fax release to [phone]. 2 Units 0    MEDICAL SUPPLY ITEM 1 pair of diabetic shoes and 3 pairs of inserts.  Pt has DM with neuropathy w/ callus. Followed for comprehensive care. For med records, fax release to [phone] 2 each 0    metFORMIN (GLUCOPHAGE) 500 MG tablet TAKE 1 TABLET (500MG  TOTAL) BY MOUTH IN THE MORNING AND TAKE 1 TABLET IN THE EVENING WITH MEALS 180 tablet 3    ofloxacin (OCUFLOX) 0.3 % ophthalmic solution APPLY 2 DROPS (OPHTHALMIC (EYE)) 4 TIMES PER DAY FOR 5 DAYS      oxyCODONE-acetaminophen (PERCOCET) 5-325 mg per tablet Take 1 tablet by mouth every four (4) hours as needed for pain. 150 tablet 0    oxyCODONE-acetaminophen (PERCOCET) 5-325 mg per tablet Take 1 tablet by mouth every four (4) hours as needed for pain. 150 tablet 0    sodium chloride 3 % NEBULIZER solution Inhale 1 vial ( 4 mL) by nebulization two (2) times a day. 720 mL 3    tamsulosin (FLOMAX) 0.4 mg capsule TAKE 1 CAPSULE BY MOUTH EVERY DAY 90 capsule 3    TRELEGY ELLIPTA 200-62.5-25 mcg DsDv INHALE 1 PUFF IN THE MORNING 60 each 5     No current facility-administered medications for this visit.        Changes to medications: Lee Baxter reports no changes at this time.    Medication list has  been reviewed and updated in Epic: Yes    Allergies   Allergen Reactions    Latex      Added based on information entered during case entry, please review and add reactions, type, and severity as needed       Changes to allergies: No    Allergies have been reviewed and updated in Epic: Yes    SPECIALTY MEDICATION ADHERENCE     Dupixent 300 mg/2mL: 0 doses of medicine on hand     Medication Adherence    Patient reported X missed doses in the last month: 0  Specialty Medication: Dupixent 300 mg/2mL Q14d  Patient is on additional specialty medications: No  Patient is on more than two specialty medications: No  Any gaps in refill history greater than 2 weeks in the last 3 months: no  Demonstrates understanding of importance of adherence: yes  Informant: child/children          Specialty medication(s) dose(s) confirmed: Regimen is correct and unchanged.     Are there any concerns with adherence? No    Adherence counseling provided? Not needed    CLINICAL MANAGEMENT AND INTERVENTION      Clinical Benefit Assessment:    Do you feel the medicine is effective or helping your condition? Yes    Clinical Benefit counseling provided? Progress note from 3/26 shows evidence of clinical benefit    Adverse Effects Assessment:    Are you experiencing any side effects? No    Are you experiencing difficulty administering your medicine? No    Quality of Life Assessment:    Quality of Life    Rheumatology  Oncology  Dermatology  Cystic Fibrosis          How many days over the past month did your asthma-COPD  keep you from your normal activities? For example, brushing your teeth or getting up in the morning. Patient declined to answer    Have you discussed this with your provider? Not needed    Acute Infection Status:    Acute infections noted within Epic:  No active infections    Patient reported infection: None    Therapy Appropriateness:    Is therapy appropriate based on current medication list, adverse reactions, adherence, clinical benefit and progress toward achieving therapeutic goals? Yes, therapy is appropriate and should be continued     Clinical Intervention:    Was an intervention completed as part of this clinical assessment? No    DISEASE/MEDICATION-SPECIFIC INFORMATION      For patients on injectable medications: Patient currently has 0 doses left.  Next injection is scheduled for 5/20.    Asthma/COPD: Have you had an asthma exacerbation in the last 30 days? No  Have you needed to use your rescue inhaler more often than usual in the last 30 days? No  Have you needed to take steroids for your asthma in the last 30 days? No    PATIENT SPECIFIC NEEDS     Does the patient have any physical, cognitive, or cultural barriers? No    Is the patient high risk? No    Does the patient require physician intervention or other additional services (i.e., nutrition, smoking cessation, social work)? No    Does the patient have an additional or emergency contact listed in their chart? Yes    SOCIAL DETERMINANTS OF HEALTH     At the [facility], we have learned that life circumstances - like trouble affording food, housing, utilities, or transportation can affect  the health of many of our patients.   That is why we wanted to ask: are you currently experiencing any life circumstances that are negatively impacting your health and/or quality of life? Patient declined to answer    Social Drivers of Health     Food Insecurity: No Food Insecurity (06/28/2022)    Hunger Vital Sign     Worried About Running Out of Food in the Last Year: Never true     Ran Out of Food in the Last Year: Never true   Tobacco Use: High Risk (04/30/2023)    Patient History     Smoking Tobacco Use: Former     Smokeless Tobacco Use: Current     Passive Exposure: Not on file   Transportation Needs: No Transportation Needs (06/28/2022)    PRAPARE - Therapist, art (Medical): No     Lack of Transportation (Non-Medical): No   Alcohol Use: Not At Risk (10/16/2022)    Alcohol Use     How often do you have a drink containing alcohol?: Never     How many drinks containing alcohol do you have on a typical day when you are drinking?: 1 - 2     How often do you have 5 or more drinks on one occasion?: Never   Housing: Low Risk  (06/28/2022)    Housing     Within the past 12 months, have you ever stayed: outside, in a car, in a tent, in an overnight shelter, or temporarily in someone else's home (i.e. couch-surfing)?: No     Are you worried about losing your housing?: No   Physical Activity: Not on file   Utilities: Low Risk  (06/28/2022)    Utilities     Within the past 12 months, have you been unable to get utilities (heat, electricity) when it was really needed?: No   Stress: Not on file   Interpersonal Safety: Not At Risk (04/30/2023)    Interpersonal Safety     Unsafe Where You Currently Live: No     Physically Hurt by Anyone: No     Abused by Anyone: No   Substance Use: Low Risk  (04/30/2023)    Substance Use     In the past year, how often have you used prescription drugs for non-medical reasons?: Never     In the past year, how often have you used illegal drugs?: Never     In the past year, have you used any substance for non-medical reasons?: No   Intimate Partner Violence: Not At Risk (10/16/2022)    Humiliation, Afraid, Rape, and Kick questionnaire     Fear of Current or Ex-Partner: No     Emotionally Abused: No     Physically Abused: No     Sexually Abused: No   Social Connections: Not on file   Financial Resource Strain: Low Risk  (06/28/2022)    Overall Financial Resource Strain (CARDIA)     Difficulty of Paying Living Expenses: Not hard at all   Health Literacy: Low Risk  (04/30/2023)    Health Literacy     : Never   Internet Connectivity: No Internet connectivity concern identified (04/30/2023)    Internet Connectivity     Do you have access to internet services: Yes     How do you connect to the internet: Personal Device at home     Is your internet connection strong enough for you to watch video on your device  without major problems?: Yes     Do you have enough data to get through the month?: Yes     Does at least one of the devices have a camera that you can use for video chat?: Yes       Would you be willing to receive help with any of the needs that you have identified today? Not applicable       SHIPPING     Specialty Medication(s) to be Shipped:   CF/Pulmonary/Asthma: Dupixent    Other medication(s) to be shipped: No additional medications requested for fill at this time     Changes to insurance: No    Cost and Payment: Patient has a $0 copay, payment information is not required.    Delivery Scheduled: Yes, Expected medication delivery date: 07/25/23.     Medication will be delivered via UPS to the confirmed prescription address in [facility].    The patient will receive a drug information handout for each medication shipped and additional FDA Medication Guides as required.  Verified that patient has previously received a Conservation officer, [name], mining.    The patient or caregiver noted above participated in the development of this care plan and knows that they can request review of or adjustments to the care plan at any time.      All of the patient's questions and concerns have been addressed.    Joseph Nickel, PharmD   [facility] Specialty Pharmacist

## 2023-07-24 MED FILL — DUPIXENT 300 MG/2 ML SUBCUTANEOUS PEN INJECTOR: SUBCUTANEOUS | 28 days supply | Qty: 4 | Fill #10

## 2023-07-29 NOTE — Unmapped (Signed)
 HISTORY:[name] is a 78 year old gentleman with a history of lung malignancy s/p lobectomy and radiation with resultant radiation pneumonitis, diabetes, hypertension, stage 3a CKD and asthma-COPD overlap who presents for follow-up.  He is accompanied by his daughter,Lee Baxter.    LS: 04/30/23 by Dr. Johnny Nanas for follow-up for his diabetes, HTN, stage 3a CKD and newly diagnosed asthma-COPD at which time his Procardia XR 30mg  was stopped due to dizziness and low BP and Lisinopril 10mg  continued.   06/06/2023 by Dr. Constantine Delude w/ Pulmonology and continued on pulmonology medication regimen with Trelegy Ellipta and Dupixent bimonthly. Restarted on 2L home oxygen at nighttime (patient states he has not started) and asked to monitor O2 sats at home.     Today he shares some concerns over diffuse abdominal pain and urinary frequency that started about 2 weeks ago worse when he eats any meals. He has had early satiety and a wt loss of 8 lbs in total since his last visit.   He shares having to urinate multiple times within an hour and not getting much urine out each time. He shares his urine color is a little pink and creamy and that it does not necessarily hurt but it feels differently than normal. He denies any fevers.  He does have BPH and uses tamsulosin 0.4 mg a day and finasteride 5 mg a day.    He endorses chronic back pains and shoulder pains that he treats with oxycodone 5mg / Tylenol 325mg  every 4 hours. He shares continuing to take this medication daily and has not recently stopped or increased his frequency. He endorses constipation with stools the consistency of sometimes sausages and sometimes little balls. He denies any diarrhea. He denies any hematochezia, nausea, or emesis.  Because of the sensation of feeling full, he has been restricting his food intake.    He has had dizziness for many years and no episodes of falling. Dizziness occurs when he stands up and lasts for seconds to minutes. He does not use any cane or walking assistance.     Since his last visit, the patient states he continues to be breathing well and has not used his oxygen.  He is not using his 2L oxygen at night because he does not believe he needs it. He is using all of his inhalers as well as dupulimab injection every 14 days.    He does not take his blood pressure at home frequently. He takes Lisinopril 10mg  monotherapy for HTN.     He checks his BG at home infrequently. He believes he had a reading of 140 on check at home.     He shares his pain at times can still be severe, despite taking his pain medications every 4 hours.      His chronic pain continues to be well-controlled with oxycodone-acetaminophen 5-325, 4-5 a day.  He had failed low-dose duloxetine before and he had to stop it because it made him feel sick and sleepy.    He shares that he did obtain a CT chest in 05/2023 and has another upcoming scan CT w/ w/o contrast for monitoring pulmonary malignancy in June.      Patient Active Problem List   Diagnosis    Primary hypertension    Incisional hernia    Hyperlipidemia    Sensorineural hearing loss    Back pain    Allergic rhinitis    Adenomatous colon polyp    Asthma-COPD overlap syndrome      Tobacco abuse  Chewing tobacco use    GERD without esophagitis    History of lung cancer    Diabetes mellitus type 2 without retinopathy      Dry eye syndrome of bilateral lacrimal glands    Pseudophakia of both eyes    Myopia with astigmatism and presbyopia, bilateral    Stage 3a chronic kidney disease (CMS-HCC)    Tinnitus of both ears    Lung cancer      Radiation pneumonitis (HHS-HCC)    Aspiration pneumonitis        Reviewed and reflects modifications made at the visit.  Current Outpatient Medications   Medication Sig Dispense Refill    atorvastatin (LIPITOR) 40 MG tablet TAKE 1 TABLET BY MOUTH EVERY DAY IN THE EVENING 90 tablet 3    blood-glucose meter kit Disp. blood glucose meter kit preferred by patient's insurance. Dx: Diabetes, E11.9 1 each 11    dupilumab (DUPIXENT) 300 mg/2 mL pen injector Inject the contents of 1 pen (300 mg) under the skin every fourteen (14) days. 4 mL 11    empty container Misc Use as directed to dispose of Dupixent pens 1 each 2    finasteride (PROSCAR) 5 mg tablet Take 1 tablet (5 mg total) by mouth daily. 90 tablet 3    lisinopril (PRINIVIL,ZESTRIL) 10 MG tablet Take 1 tablet (10 mg total) by mouth daily. 90 tablet 3    metFORMIN (GLUCOPHAGE) 500 MG tablet TAKE 1 TABLET (500MG  TOTAL) BY MOUTH IN THE MORNING AND TAKE 1 TABLET IN THE EVENING WITH MEALS 180 tablet 3    ofloxacin (OCUFLOX) 0.3 % ophthalmic solution APPLY 2 DROPS (OPHTHALMIC (EYE)) 4 TIMES PER DAY FOR 5 DAYS      oxyCODONE-acetaminophen (PERCOCET) 5-325 mg per tablet Take 1 tablet by mouth every four (4) hours as needed for pain. 150 tablet 0    sodium chloride 3 % NEBULIZER solution Inhale 1 vial ( 4 mL) by nebulization two (2) times a day. 720 mL 3    TRELEGY ELLIPTA 200-62.5-25 mcg DsDv INHALE 1 PUFF IN THE MORNING 60 each 5    albuterol 2.5 mg /3 mL (0.083 %) nebulizer solution Inhale 3 mL (2.5 mg total) by nebulization every four (4) hours as needed for wheezing or shortness of breath. 60 mL 2    albuterol HFA 90 mcg/actuation inhaler TAKE 2 PUFFS BY MOUTH EVERY 6 HOURS AS NEEDED FOR WHEEZE (Patient not taking: Reported on 07/30/2023) 8 g 5    EPINEPHrine (EPIPEN) 0.3 mg/0.3 mL injection Inject 0.3 mL (0.3 mg total) into the muscle once as needed for anaphylaxis for up to 1 dose. (Patient not taking: Reported on 07/30/2023) 1 each 0    fluticasone propionate (FLONASE) 50 mcg/actuation nasal spray 2 sprays into each nostril daily. (Patient not taking: Reported on 07/30/2023) 48 mL 7    ipratropium-albuterol (DUO-NEB) 0.5-2.5 mg/3 mL nebulizer Inhale 1 vial ( 3 mL) by nebulization every six (6) hours. Use as directed 1080 mL 1    MEDICAL SUPPLY ITEM Pt has DM hx of neuropathy w/ callus. Followed for comprehensive care. For med records, fax release to [phone]. (Patient not taking: Reported on 07/30/2023) 2 Units 0    MEDICAL SUPPLY ITEM 1 pair of diabetic shoes and 3 pairs of inserts.  Pt has DM with neuropathy w/ callus. Followed for comprehensive care. For med records, fax release to [phone] (Patient not taking: Reported on 07/30/2023) 2 each 0    tamsulosin (FLOMAX) 0.4 mg capsule Take 1  capsule (0.4 mg total) by mouth two (2) times a day. 180 capsule 3     No current facility-administered medications for this visit.     Allergies   Allergen Reactions    Latex      Added based on information entered during case entry, please review and add reactions, type, and severity as needed        SOCIAL History: as above.  See previous notes.    PHYSICAL:  General: Well-developed, well-groomed and dressed gentleman who is in no respiratory distress  Vitals:   Vitals:    07/30/23 1051   BP: 126/84   BP Position: Sitting   Pulse: 84   Resp: 16   Temp: 35.4 ??C (95.7 ??F)   TempSrc: Temporal   SpO2: 99%   Weight: 56.7 kg (125 lb)   Height: 165.1 cm (5' 5)        BP Readings from Last 3 Encounters:   07/30/23 126/84   06/06/23 124/53   05/15/23 118/55     Wt Readings from Last 3 Encounters:   07/30/23 56.7 kg (125 lb)   06/06/23 60.1 kg (132 lb 6.4 oz)   05/15/23 60.7 kg (133 lb 12.8 oz)     HEENT: Conjunctivae normal, no icterus.    Neck: No cervical lymphadenopathy. No thyromegaly. TM clear b/l. Normal oropharynx.  Pulm: B/l basilar crackles w/ decreased air movement, no dullness to percussion. Minimal tactile fremitus on lower posterior lung fields.  CV: Normal S1 and S2, no murmur.  Abd: Liver moderately decreased in size by percussion. No hepatosplenomegaly. Tenderness to palpation diffusely. Normoactive bowel sounds. Central abdominal hernia noted and prominent during valsalva but reducible. Multiple surgical scars noted.   Ext:  No pedal edema.  Psychiatric: Bright mood, laughs.    DIAGNOSTIC TESTS:     04/30/2023: POC A1c 7.2%.        ASSESSMENT AND PLANS:    --1. Constipation, abdominal pain, and early satiety. Patient presenting with 2-3 weeks of constipation and abdominal sx w/ urinary frequency and reports of urine color change and known hx of BPH. He believes his wt changes have been due to decreased appetite from his abdominal pain. Ddx includes constipation 2/2 chronic opioid use or abdominal pain and urinary frequency from T2DM; however w/ known hx of malignancy and sx's concerning for malignancy, will evaluate further with lab work-up (CBC w/ diff and CMP) in addition to UA w/ culture, CXR, and KUB.  He was instructed patient to start Senna and Miralax for constipation treatment to be continued chronically.     --2.Aaron Aas  Dysuria and pelvic pain in a patient with BPH.  Rule out UTI/prostatitis.  Ordered urinalysis with culture.Plan to increase Tamsulosin 0.4mg  to BID dosing, continue Finasteride.     -- 3.  Primary hypertension appears well managed with Lisinopril 10 mg. In office BP 126/84.  BMP and CBC obtained in 08/2022.    -- 4.  Chronic pain due to low back pain and cervical spine OA s/p cervical spine surgery.  Percocet refills on 07/30/2023. Refilled oxycodone 5-acetaminophen 325 #150 for the first month, #150 for the second month, and #150 for the third month.  Has failed duloxetine low-dose in the past.  Chronic pain contract previously signed.    -- 5. Diabetes mellitus type 2, well-managed from previous visit in 04/2023 A1C 7.2%, showing continued good control.  Urine A/C 9.5 in 04/2023. Continue metformin 1000 mg twice a day, atorvastatin 40 mg. Will obtain A1C  with labs today.    -- 6.  Eosinophilic asthma and COPD, well-controlled with current regimen of Trelegy and Dupixent that he will continue. Patient has not been on any home oxygen but I reminded him that he can use the oxygen at bedtime as suggested by his pulmonologist.  Following with pulmonologist most recently seen in 05/2023    --7.  Right lower lobe non-small cell lung cancer not pathologically confirmed, status post radiation therapy. Most recently obtained CT in 05/15/2023; and following up with imaging every 3 months. Patient will obtain repeat scan in 08/2023 and follow-up with radiation oncologist.  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    --8.  General health, recommended Tdap and RSV vaccine that he can obtain at the local pharmacy at previous visit.    Follow-up with me in 3 months or as needed    Charliene Conte MS4    I have verified all student documentation or findings performed by Charliene Conte, MS4.  I have personally performed or re-performed the physical exam and medical decision making. Suzzanna Estimable Alem??n, MD

## 2023-07-30 ENCOUNTER — Inpatient Hospital Stay: Admit: 2023-07-30 | Discharge: 2023-07-31 | Disposition: A | Discharge status: Home / Self Care | Payer: MEDICAID

## 2023-07-30 ENCOUNTER — Ambulatory Visit: Admit: 2023-07-30 | Discharge: 2023-07-31 | Disposition: A | Discharge status: Home / Self Care | Payer: MEDICAID

## 2023-07-30 ENCOUNTER — Ambulatory Visit
Admit: 2023-07-30 | Discharge: 2023-07-30 | Disposition: A | Discharge status: Home / Self Care | Payer: MEDICAID | Attending: Internal Medicine | Primary: Internal Medicine

## 2023-07-30 ENCOUNTER — Inpatient Hospital Stay: Admit: 2023-07-30 | Discharge: 2023-07-30 | Disposition: A | Discharge status: Home / Self Care | Payer: MEDICAID

## 2023-07-30 DIAGNOSIS — K5900 Constipation, unspecified: Principal | ICD-10-CM

## 2023-07-30 DIAGNOSIS — R350 Frequency of micturition: Principal | ICD-10-CM

## 2023-07-30 DIAGNOSIS — E119 Type 2 diabetes mellitus without complications: Principal | ICD-10-CM

## 2023-07-30 DIAGNOSIS — N400 Enlarged prostate without lower urinary tract symptoms: Principal | ICD-10-CM

## 2023-07-30 DIAGNOSIS — N1831 Stage 3a chronic kidney disease (CMS-HCC): Principal | ICD-10-CM

## 2023-07-30 DIAGNOSIS — R630 Anorexia: Principal | ICD-10-CM

## 2023-07-30 DIAGNOSIS — E785 Hyperlipidemia, unspecified: Principal | ICD-10-CM

## 2023-07-30 DIAGNOSIS — I10 Essential (primary) hypertension: Principal | ICD-10-CM

## 2023-07-30 DIAGNOSIS — J4489 Asthma-COPD overlap syndrome: Principal | ICD-10-CM

## 2023-07-30 DIAGNOSIS — R634 Abnormal weight loss: Principal | ICD-10-CM

## 2023-07-30 LAB — COMPREHENSIVE METABOLIC PANEL
ALBUMIN: 3.8 g/dL (ref 3.4–5.0)
ALKALINE PHOSPHATASE: 95 U/L (ref 46–116)
ALT (SGPT): 18 U/L (ref 10–49)
ANION GAP: 9 mmol/L (ref 5–14)
AST (SGOT): 20 U/L (ref <=34)
BILIRUBIN TOTAL: 0.4 mg/dL (ref 0.3–1.2)
BLOOD UREA NITROGEN: 50 mg/dL — ABNORMAL HIGH (ref 9–23)
BUN / CREAT RATIO: 23
CALCIUM: 10.3 mg/dL (ref 8.7–10.4)
CHLORIDE: 104 mmol/L (ref 98–107)
CO2: 23.9 mmol/L (ref 20.0–31.0)
CREATININE: 2.21 mg/dL — ABNORMAL HIGH (ref 0.73–1.18)
EGFR CKD-EPI (2021) MALE: 30 mL/min/1.73m2 — ABNORMAL LOW (ref >=60)
GLUCOSE RANDOM: 184 mg/dL — ABNORMAL HIGH (ref 70–179)
POTASSIUM: 7 mmol/L (ref 3.4–4.8)
PROTEIN TOTAL: 7.7 g/dL (ref 5.7–8.2)
SODIUM: 137 mmol/L (ref 135–145)

## 2023-07-30 LAB — CBC W/ AUTO DIFF
BASOPHILS ABSOLUTE COUNT: 0.1 10*9/L (ref 0.0–0.1)
BASOPHILS ABSOLUTE COUNT: 0.1 10*9/L (ref 0.0–0.1)
BASOPHILS RELATIVE PERCENT: 0.6 %
BASOPHILS RELATIVE PERCENT: 0.5 %
EOSINOPHILS ABSOLUTE COUNT: 0.4 10*9/L (ref 0.0–0.5)
EOSINOPHILS ABSOLUTE COUNT: 0.4 10*9/L (ref 0.0–0.5)
EOSINOPHILS RELATIVE PERCENT: 3.6 %
EOSINOPHILS RELATIVE PERCENT: 4.4 %
HEMATOCRIT: 39.2 % (ref 39.0–48.0)
HEMATOCRIT: 39.1 % (ref 39.0–48.0)
HEMOGLOBIN: 13.8 g/dL (ref 12.9–16.5)
HEMOGLOBIN: 13.1 g/dL (ref 12.9–16.5)
LYMPHOCYTES ABSOLUTE COUNT: 1.1 10*9/L (ref 1.1–3.6)
LYMPHOCYTES ABSOLUTE COUNT: 1.4 10*9/L (ref 1.1–3.6)
LYMPHOCYTES RELATIVE PERCENT: 11.5 %
LYMPHOCYTES RELATIVE PERCENT: 13.6 %
MEAN CORPUSCULAR HEMOGLOBIN CONC: 35.2 g/dL (ref 32.0–36.0)
MEAN CORPUSCULAR HEMOGLOBIN CONC: 33.5 g/dL (ref 32.0–36.0)
MEAN CORPUSCULAR HEMOGLOBIN: 32.3 pg (ref 25.9–32.4)
MEAN CORPUSCULAR HEMOGLOBIN: 31.6 pg (ref 25.9–32.4)
MEAN CORPUSCULAR VOLUME: 91.8 fL (ref 77.6–95.7)
MEAN CORPUSCULAR VOLUME: 94.5 fL (ref 77.6–95.7)
MEAN PLATELET VOLUME: 8.2 fL (ref 6.8–10.7)
MEAN PLATELET VOLUME: 8.1 fL (ref 6.8–10.7)
MONOCYTES ABSOLUTE COUNT: 0.8 10*9/L (ref 0.3–0.8)
MONOCYTES ABSOLUTE COUNT: 0.9 10*9/L — ABNORMAL HIGH (ref 0.3–0.8)
MONOCYTES RELATIVE PERCENT: 8.3 %
MONOCYTES RELATIVE PERCENT: 9.5 %
NEUTROPHILS ABSOLUTE COUNT: 7.6 10*9/L (ref 1.8–7.8)
NEUTROPHILS ABSOLUTE COUNT: 7.2 10*9/L (ref 1.8–7.8)
NEUTROPHILS RELATIVE PERCENT: 76 %
NEUTROPHILS RELATIVE PERCENT: 72 %
PLATELET COUNT: 298 10*9/L (ref 150–450)
PLATELET COUNT: 296 10*9/L (ref 150–450)
RED BLOOD CELL COUNT: 4.27 10*12/L (ref 4.26–5.60)
RED BLOOD CELL COUNT: 4.14 10*12/L — ABNORMAL LOW (ref 4.26–5.60)
RED CELL DISTRIBUTION WIDTH: 13.7 % (ref 12.2–15.2)
RED CELL DISTRIBUTION WIDTH: 13.9 % (ref 12.2–15.2)
WBC ADJUSTED: 9.9 10*9/L (ref 3.6–11.2)
WBC ADJUSTED: 9.9 10*9/L (ref 3.6–11.2)

## 2023-07-30 LAB — LACTATE DEHYDROGENASE: LACTATE DEHYDROGENASE: 149 U/L (ref 120–246)

## 2023-07-30 LAB — BASIC METABOLIC PANEL
ANION GAP: 7 mmol/L (ref 5–14)
ANION GAP: 9 mmol/L (ref 5–14)
BLOOD UREA NITROGEN: 47 mg/dL — ABNORMAL HIGH (ref 9–23)
BLOOD UREA NITROGEN: 48 mg/dL — ABNORMAL HIGH (ref 9–23)
BUN / CREAT RATIO: 19
BUN / CREAT RATIO: 21
CALCIUM: 10.2 mg/dL (ref 8.7–10.4)
CALCIUM: 10.3 mg/dL (ref 8.7–10.4)
CHLORIDE: 110 mmol/L — ABNORMAL HIGH (ref 98–107)
CHLORIDE: 110 mmol/L — ABNORMAL HIGH (ref 98–107)
CO2: 23 mmol/L (ref 20.0–31.0)
CO2: 22 mmol/L (ref 20.0–31.0)
CREATININE: 2.47 mg/dL — ABNORMAL HIGH (ref 0.73–1.18)
CREATININE: 2.29 mg/dL — ABNORMAL HIGH (ref 0.73–1.18)
EGFR CKD-EPI (2021) MALE: 26 mL/min/1.73m2 — ABNORMAL LOW (ref >=60)
EGFR CKD-EPI (2021) MALE: 28 mL/min/1.73m2 — ABNORMAL LOW (ref >=60)
GLUCOSE RANDOM: 246 mg/dL — ABNORMAL HIGH (ref 70–179)
GLUCOSE RANDOM: 109 mg/dL (ref 70–179)
POTASSIUM: 6 mmol/L — ABNORMAL HIGH (ref 3.4–4.8)
POTASSIUM: 5.4 mmol/L — ABNORMAL HIGH (ref 3.4–4.8)
SODIUM: 140 mmol/L (ref 135–145)
SODIUM: 141 mmol/L (ref 135–145)

## 2023-07-30 LAB — HEMOGLOBIN A1C
ESTIMATED AVERAGE GLUCOSE: 160 mg/dL
HEMOGLOBIN A1C: 7.2 % — ABNORMAL HIGH (ref 4.8–5.6)

## 2023-07-30 LAB — URINALYSIS WITH MICROSCOPY WITH CULTURE REFLEX PERFORMABLE
BACTERIA: NONE SEEN /HPF
BILIRUBIN UA: NEGATIVE
BILIRUBIN UA: NEGATIVE
BLOOD UA: NEGATIVE
GLUCOSE UA: 500 — AB
GLUCOSE UA: >1000 — AB
KETONES UA: NEGATIVE
KETONES UA: NEGATIVE
NITRITE UA: POSITIVE — AB
NITRITE UA: NEGATIVE
PH UA: 5.5 (ref 5.0–9.0)
PH UA: 5.5 (ref 5.0–9.0)
RBC UA: 30 /HPF — ABNORMAL HIGH (ref <=3)
RBC UA: 16 /HPF — ABNORMAL HIGH (ref <=3)
RENAL TUBULAR EPITHELIAL CELLS: <1 /HPF — ABNORMAL HIGH (ref <=0)
SPECIFIC GRAVITY UA: 1.016 (ref 1.003–1.030)
SPECIFIC GRAVITY UA: 1.014 (ref 1.003–1.030)
SQUAMOUS EPITHELIAL: <1 /HPF (ref 0–5)
SQUAMOUS EPITHELIAL: <1 /HPF (ref 0–5)
UROBILINOGEN UA: <2
UROBILINOGEN UA: <2
WBC UA: >182 /HPF — ABNORMAL HIGH (ref <=2)
WBC UA: >182 /HPF — ABNORMAL HIGH (ref <=2)

## 2023-07-30 LAB — PHOSPHORUS: PHOSPHORUS: 3.4 mg/dL (ref 2.4–5.1)

## 2023-07-30 LAB — BLOOD GAS, VENOUS
BASE EXCESS VENOUS: -1.5 (ref -2.0–2.0)
HCO3 VENOUS: 24 mmol/L (ref 22–27)
O2 SATURATION VENOUS: 63.5 % (ref 40.0–85.0)
PCO2 VENOUS: 41 mmHg (ref 40–60)
PH VENOUS: 7.37 (ref 7.32–7.43)
PO2 VENOUS: 35 mmHg (ref 30–55)

## 2023-07-30 LAB — MAGNESIUM: MAGNESIUM: 2 mg/dL (ref 1.6–2.6)

## 2023-07-30 LAB — URIC ACID: URIC ACID: 9.3 mg/dL — ABNORMAL HIGH (ref 3.7–9.2)

## 2023-07-30 MED ORDER — TAMSULOSIN 0.4 MG CAPSULE
ORAL_CAPSULE | Freq: Two times a day (BID) | ORAL | 3 refills | 90.00000 days | Status: CP
Start: 2023-07-30 — End: ?

## 2023-07-30 MED ADMIN — dextrose 50 % in water (D50W) 50 % solution 25 g: 25 g | INTRAVENOUS | @ 23:00:00 | Stop: 2023-07-30

## 2023-07-30 MED ADMIN — cefTRIAXone (ROCEPHIN) 1 g in sodium chloride 0.9 % (NS) 100 mL IVPB-MBP: 1 g | INTRAVENOUS | Stop: 2023-07-30

## 2023-07-30 MED ADMIN — calcium gluconate 2 g in sodium chloride (NS) 0.9 % 100 mL IVPB: 2 g | INTRAVENOUS | Stop: 2023-07-30

## 2023-07-30 MED ADMIN — insulin regular (HumuLIN,NovoLIN) injection 5 Units: 5 [IU] | INTRAVENOUS | @ 23:00:00 | Stop: 2023-07-30

## 2023-07-30 NOTE — Unmapped (Signed)
 Pt sent from [facility] for possible admission per pt's daughter. Per note:    His potassium level is very high (7) and he has an acute kidney on chronic kidney dysfunction that may be due to UTI/prostatitis given abnormal urinalysis and symptomatology. Advised that he go to [facility] emergency department today for evaluation, treatment and possible admission.

## 2023-07-30 NOTE — Unmapped (Signed)
 Here from PCP for concern for hyperkalemia with a level of 7 as well as kidney dysfunction from from UTI. Denies any CP, SOB. Does endorse some weakness

## 2023-07-30 NOTE — Unmapped (Signed)
 Internal Medicine (MEDL) History & Physical    Assessment & Plan:   Lee Baxter is a 78 y.o. male with pertinent PMHx of asthma-COPD overlap, HTN, CKD 3a, T2DM, HLD, GERD, BPH, prior LUL adenocarcinoma (s/p partial lobectomy), and RLL NSCLC (s/p radiation c/b radiation pneumonitis) who presented to [facility] due to hyperkalemia and AKI identified on outpatient labs in the setting of 2 weeks of  urinary frequency/hesitancy/incomplete voiding, mild dysuria, and mild suprapubic discomfort, also found to have R renal mass.    Principal Problem:    Hyperkalemia  Active Problems:    Primary hypertension    Hyperlipidemia    AKI (acute kidney injury)    Asthma-COPD overlap syndrome      Chewing tobacco use    GERD without esophagitis    History of lung cancer    Diabetes mellitus type 2 without retinopathy      Dry eye syndrome of bilateral lacrimal glands    Stage 3a chronic kidney disease (CMS-HCC)    Acute cystitis with hematuria      Active Problems  Urinary retention - BPH - Possible cystitis - AKI on  CKD 3a  Patient with known BPH (on tamsulosin and finasteride), presenting with 2 weeks of urinary hesitancy/urgency/incomplete voiding, found to have PVR of >218mL after voiding in the ED, all of which is concerning for urinary retention/LUTS. Additionally, reported to PCP that he sometimes has hard, ball-like stools, so suspect constipation is playing a role. Interestingly, no hydronephrosis on CT, but given convincing history will place Foley for resolution of obstruction. Cr on presentation 2.47 from baseline ~1.1 (though has fluctuated significantly up to ~1.6 in the past). Suspect post-renal in etiology, though patient also endorses poor appetite for the past 2 weeks, so pre-renal etiology could be playing a role though not grossly volume down on exam. Finally, suprapubic pain and mild possible dysuria in setting of suspected retention, along with UA with pyuria/large leuk esterase (though no bacteria/nitrite) raises concern for cystitis, though no CVA tenderness, fever, or leukocytosis to suggest pyelonephritis.  - Nephrology consulted by ED, appreciate recommendations  - Place Foley now  - 1L NS now  - s/p CTX --> switch to cephalexin 500mg  q24h for 7 day course (renally adjusted), consider discontinuing if Ucx negative  - Home finasteride 5mg  daily  - Home tamsulosin 0.4mg  nightly    Hyperkalemia  K 7 on outpatient labs, improved to 6 on presentation to the ED, and 5.4 on recheck s/p insulin/glucose. Suspect due to AKI as above, as well as home lisinopril. No chest pain or palpitations. ECG with NSR without stigmata of hyperkalemia (baseline borderline prolonged PR interval), though slight diffuse notching of QRS complex noted which does not appear to been present on prior ECGs.  - s/p CaGluconate 2g, regular insulin 5u with d50  - BMP in AM  - Consider Lokelma if K >5.5 (will need to improve constipation to use)  - Telemetry    Right renal lesion  Patient with 1.1 cm exophytic left upper pole hyperattenuating lesion noted on CT A/P, with differential for radiology including proteinaceous cyst or RCC.  Patient has reported ~3 months of weight loss (though on review of weight trend, appears he has ranged from 122-134 lbs over the past year, currently 123lbs).  Additionally, he has had increasing borderline hypercalcemia, which could go along with RCC. UA with 16 RBC, though also in the setting of possible cystitis as above.  - Consider MRI inpatient vs outpatient  -  Consider Urology consultation to discuss further evaluation    Constipation  - Miralax daily    Advanced care planning  Confirmed with patient on admission that he would not want resuscitation or intubation in the setting of cardiopulmonary arrest.  - DNR/DNI    Chronic Problems  HTN: hold home lisinopril iso AKI/hyperkalemia  COPD-Asthma: Breo Ellipta and Incruse Ellipta for home Trelegy, HTS 3% nebs BID, DuoNebs PRN; hold home dupilumab (q2weeks)  T2DM: SSI, hold home metformin iso AKI  HLD: home atorvastatin 40mg   Dry eyes: Theratears TID      The patient's presentation is complicated by the following clinically significant conditions requiring additional evaluation and treatment: - Chronic kidney disease POA requiring further investigation, treatment, or monitoring   - Disorders of electrolytes, volume status, and acid/base status: - Hyperkalemia POA requiring further investigation, treatment, or monitoring     Issues Impacting Complexity of Management:  -The patient is at high risk from Hospital immobility in an elderly patient given baseline poor functional status with a high risk of causing delirium and further decline in function      Checklist:  Diet: Regular Diet and K restricted  DVT PPx: Heparin 5000units q8h  Code Status: DNR and DNI  Dispo: Patient appropriate for Inpatient based on expectation of ongoing need for hospitalization greater than two midnights based on severity of presentation/services including telemetry monitoring for hyperkalemia, investigation and management of AKI, antibiotics for suspected UTI    Team Contact Information:   Primary Team: Internal Medicine (MEDL)  Primary Resident: Kristina Pfeiffer, MD  Resident's Pager: [phone] (Gen MedL Intern - [place])    Chief Concern:   Hyperkalemia      Subjective:   Lee Baxter is a 78 y.o. male with pertinent PMHx of asthma-COPD overlap, HTN, CKD 3a, T2DM, HLD, GERD, BPH, prior LUL adenocarcinoma (s/p partial lobectomy), and RLL NSCLC (s/p radiation c/b radiation pneumonitis) who presents due to hyperkalemia and AKI identified on outpatient labs.    History obtained by Patient interview.     HPI:  Patient was seen by his PCP on 5/19 and reported 2 weeks of urinary frequency, urinary hesitancy, incomplete voiding, and not frank pain/burning but mild discomfort with urination.  He has also had some suprapubic pain, no flank/back pain.  He has also had generalized weakness during this time, decreased appetite, some bloating, and ~10 pound weight loss over the past 3 months.  Labs performed at PCP demonstrated hyperkalemia 7 and AKI on CKD with creatinine 2.21, and he was directed to present to the ED.    Otherwise, patient denies fevers, diarrhea, swelling, dyspnea, lightheadedness.  He reports occasional brief, random substernal chest discomfort that is not associated with any other symptoms and is nonexertional (symptoms occurring while sitting on the couch), but no chest pain or palpitations today.    In the ED:  Vitals: Afeb, HR 89, BP 115/69 > 96/60, 99% RA  Labs:   - CBC unremarkable  - Cr 2.47 (baseline ~1.1), K 6  - A1c 7.2%  - VBG 7.37/41  - UA > 182 WBC, 16 RBC, larg leuk est, no bacteria, negative nitrites  ECG: ECG with NSR, borderline prolonged PR (baselinne), new diffuse notching of QRS complex widened widened QRS  Imaging:   - KUB mildly dilated loops of small bowel up to 3.2cm, possible developing ileus vs early obstruction, mil-moderate colonic stool  - CXR no acute abnormality  - CT A/P wo contrast: no acute abnormality, new left upper pole  exophytic 1.1cm hyperattenuating lesion, proteinaceous cyst vs RCC, recommended nonemergent MRI  Interventions:   - CaGluconate 2g, insulin regular 5u w/ d50, CTX    Pertinent Surgical Hx  Past Surgical History:   Procedure Laterality Date    CATARACT EXTRACTION W/ INTRAOCULAR LENS IMPLANT Left 02/13/2006    Hecker MD    CATARACT EXTRACTION W/ INTRAOCULAR LENS IMPLANT Right 03/27/2006    Hecker MD    CHOLECYSTECTOMY      HERNIA REPAIR      KIDNEY STONE SURGERY      LUNG LOBECTOMY Left 1992    PR ALLOGRAFT FOR SPINE SURGERY ONLY MORSELIZED N/A 11/06/2019    Procedure: ALLOGRAFT FOR SPINE SURGERY ONLY; MORSELIZED;  Surgeon: Kristie Pew, MD;  [facility];  Service: Orthopedics    PR ARTHRD PST/PSTLAT TQ 1NTRSPC CRV BELW C2 SEGMENT N/A 11/06/2019    Procedure: ARTHRODESIS, POSTERIOR OR POSTEROLATERAL TECHNIQUE, SINGLE LEVEL; CERVICAL BELOW C2 SEGMENT;  Surgeon: Kristie Pew, MD;  Location: [facility] OR [facility];  Service: Orthopedics    PR ARTHRODESIS PST/PSTLAT TQ 1NTRSPC EA ADDL NTRSPC N/A 11/06/2019    Procedure: ARTHRODESIS, POSTERIOR OR POSTEROLATERAL TECHNIQUE, SINGLE LEVEL; EACH ADDITIONAL VERTEBRAL SEGMENT   x4;  Surgeon: Kristie Pew, MD;  Location: [facility] OR [facility];  Service: Orthopedics    PR AUTOGRAFT SPINE SURGERY LOCAL FROM SAME INCISION N/A 11/06/2019    Procedure: AUTOGRAFT/SPINE SURG ONLY (W/HARVEST GRAFT); LOCAL (EG, RIB/SPINOUS PROC, LAM FRGMT) OBTAIN FROM SAME INCIS;  Surgeon: Kristie Pew, MD;  [facility] OR [facility];  Service: Orthopedics    PR BRNSCHSC TNDSC EBUS DX/TX INTERVENTION [facility] LES Right 11/21/2021    Procedure: BRONCH, RIGID OR FLEXIBLE, INCLUDING FLUORO GUIDANCE, WHEN PERFORMED; WITH TRANSENDOSCOPIC EBUS DURING BRONCHOSCOPIC DIAGNOSTIC OR THERAPEUTIC INTERVENTION(S) FOR PERIPHERAL LESION(S);  Surgeon: Vera Gip, MD;  Location: MAIN OR [place];  Service: Pulmonary    PR BRONCHOSCOPY,COMPUTER ASSIST/IMAGE-GUIDED NAVIGATION N/A 11/21/2021    Procedure: ROBOT ION BRONCHOSCOPY,RIGID OR FLEXIBLE,INCLUDE FLUORO WHEN PERFORMED; W/COMPUTER-ASSIST,IMAGE-GUIDED NAVIGATION;  Surgeon: Vera Gip, MD;  Location: MAIN OR [place];  Service: Pulmonary    PR BRONCHOSCOPY,COMPUTER ASSIST/IMAGE-GUIDED NAVIGATION N/A 11/21/2021    Procedure: BRONCHOSCOPY, RIGID OR FLEXIBLE, INCLUDE FLUORO WHEN PERFORMED; W/COMPUTER-ASSIST, IMAGE-GUIDED NAVIGATION;  Surgeon: Vera Gip, MD;  Location: MAIN OR [place];  Service: Pulmonary    PR BRONCHOSCOPY,DIAGNOSTIC W LAVAGE Right 06/27/2022    Procedure: BRONCHOSCOPY, RIGID OR FLEXIBLE, INCLUDE FLUOROSCOPIC GUIDANCE WHEN PERFORMED; W/BRONCHIAL ALVEOLAR LAVAGE;  Surgeon: Filbert Huff, MD;  Location: MAIN OR [place];  Service: Pulmonary    PR BRONCHOSCOPY,PLACEMENT FIDUCIAL MARKERS, 1/MULT N/A 11/21/2021    Procedure: BRONCHOSCOPY, RIGID OR FLEXIBLE, FLOURO WHEN PERFORMED; PLACEMENT OF FIDUCIAL MARKERS, SINGLE OR MULTIPLE;  Surgeon: Vera Gip, MD;  Location: MAIN OR [place];  Service: Pulmonary    PR BRONCHOSCOPY,TRANSBRON ASPIR BX Right 11/21/2021    Procedure: BRONCHOSCOPY, RIGID/FLEX, INCL FLUORO; W/TRANSBRONCH NDL ASPIRAT BX, TRACHEA, MAIN STEM &/OR LOBAR BRONCHUS;  Surgeon: Vera Gip, MD;  Location: MAIN OR [place];  Service: Pulmonary    PR BRONCHOSCOPY,TRANSBRONCH BIOPSY Right 11/21/2021    Procedure: BRONCHOSCOPY, RIGID/FLEXIBLE, INCLUDE FLUORO GUIDANCE WHEN PERFORMED; W/TRANSBRONCHIAL LUNG BX, SINGLE LOBE;  Surgeon: Vera Gip, MD;  Location: MAIN OR [place];  Service: Pulmonary    PR C-LAMINOPLASTY W/GRAFT/PLATE, 2 OR MORE N/A 11/06/2019    Procedure: LAMINOPLASTY, CERVICAL, DECOMPRESS SPINAL CORD, 2+ VERTEBRAL SEGMENTS; W/RECONSTRUCT POSTERIOR BONY ELEMENT;  Surgeon: Kristie Pew, MD;  Location: [facility] OR [facility];  Service: Orthopedics    PR COLONOSCOPY W/BIOPSY SINGLE/MULTIPLE N/A 07/05/2017    Procedure: COLONOSCOPY, FLEXIBLE, PROXIMAL TO SPLENIC FLEXURE; WITH BIOPSY, SINGLE OR MULTIPLE;  Surgeon: Marsh Skeans, MD;  [facility];  Service: Gastroenterology    PR IONM 1 ON 1 IN OR W/ATTENDANCE EACH 15 MINUTES N/A 11/06/2019    Procedure: CONTINUOUS INTRAOPERATIVE NEUROPHYSIOLOGY MONITORING IN OR;  Surgeon: Kristie Pew, MD;  [facility]: [facility] OR [facility];  Service: Orthopedics    PR LAM W/O FACETEC FORAMOT/DSKC 1/2 VRT SEG, CERVICAL N/A 11/06/2019    Procedure: LAMINECT W/EXPLOR WO FACETECT 1-2 VERTEB; CERV;  Surgeon: Kristie Pew, MD;  Location: [facility] OR [facility];  Service: Orthopedics    PR POSTERIOR SEGMENTAL INSTRUMENTATION 3-6 VRT SEG N/A 11/06/2019    Procedure: POST SEGMT INSTRUM; 3 TO 6 VERTEB SEGMT CERVICAL;  Surgeon: Kristie Pew, MD;  [facility] OR [facility];  Service: Orthopedics    SINUS SURGERY         Pertinent Family Hx  Family History   Problem Relation Age of Onset    Cancer Daughter Strabismus Daughter     Hypertension Daughter     No Known Problems Mother     No Known Problems Father     No Known Problems Sister     No Known Problems Brother     No Known Problems Maternal Aunt     No Known Problems Maternal Uncle     No Known Problems Paternal Aunt     No Known Problems Paternal Uncle     No Known Problems Maternal Grandmother     No Known Problems Maternal Grandfather     No Known Problems Paternal Grandmother     No Known Problems Paternal Grandfather     Amblyopia Neg Hx     Blindness Neg Hx     Cataracts Neg Hx     Diabetes Neg Hx     Glaucoma Neg Hx     Macular degeneration Neg Hx     Retinal detachment Neg Hx     Stroke Neg Hx     Thyroid disease Neg Hx        Pertinent Social Hx   Lives alone and is independent in ADLs and IADLs.  Ambulates without assistance.  His daughter lives nearby.  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  Quit smoking ~35 years ago, but uses chewing tobacco.  No EtOH or drugs.    Allergies  Latex    I reviewed the Medication List. The current list is Accurate  Prior to Admission medications    Medication Dose, Route, Frequency   albuterol 2.5 mg /3 mL (0.083 %) nebulizer solution 2.5 mg, Nebulization, Every 4 hours PRN   albuterol HFA 90 mcg/actuation inhaler TAKE 2 PUFFS BY MOUTH EVERY 6 HOURS AS NEEDED FOR WHEEZE  Patient not taking: Reported on 07/30/2023   atorvastatin (LIPITOR) 40 MG tablet 40 mg, Oral, Every evening   blood-glucose meter kit Disp. blood glucose meter kit preferred by patient's insurance. Dx: Diabetes, E11.9   dupilumab (DUPIXENT) 300 mg/2 mL pen injector Inject the contents of 1 pen (300 mg) under the skin every fourteen (14) days.   empty container Misc Use as directed to dispose of Dupixent pens   EPINEPHrine (EPIPEN) 0.3 mg/0.3 mL injection 0.3 mg, Intramuscular, Once as needed  Patient not taking: Reported on 07/30/2023   finasteride (PROSCAR) 5 mg tablet 5  mg, Oral, Daily (standard)   fluticasone propionate (FLONASE) 50 mcg/actuation nasal spray 2 sprays, Each Nare, Daily (standard)  Patient not taking: Reported on 07/30/2023   ipratropium-albuterol (DUO-NEB) 0.5-2.5 mg/3 mL nebulizer Inhale 1 vial ( 3 mL) by nebulization every six (6) hours. Use as directed   lisinopril (PRINIVIL,ZESTRIL) 10 MG tablet 10 mg, Oral, Daily (standard)   MEDICAL SUPPLY ITEM Pt has DM hx of neuropathy w/ callus. Followed for comprehensive care. For med records, fax release to [phone].  Patient not taking: Reported on 07/30/2023   MEDICAL SUPPLY ITEM 1 pair of diabetic shoes and 3 pairs of inserts.<BR>Pt has DM with neuropathy w/ callus. Followed for comprehensive care. For med records, fax release to [phone]  Patient not taking: Reported on 07/30/2023   metFORMIN (GLUCOPHAGE) 500 MG tablet TAKE 1 TABLET (500MG  TOTAL) BY MOUTH IN THE MORNING AND TAKE 1 TABLET IN THE EVENING WITH MEALS   ofloxacin (OCUFLOX) 0.3 % ophthalmic solution APPLY 2 DROPS (OPHTHALMIC (EYE)) 4 TIMES PER DAY FOR 5 DAYS   oxyCODONE-acetaminophen (PERCOCET) 5-325 mg per tablet 1 tablet, Oral, Every 4 hours PRN   sodium chloride 3 % NEBULIZER solution Inhale 1 vial ( 4 mL) by nebulization two (2) times a day.   tamsulosin (FLOMAX) 0.4 mg capsule 0.4 mg, Oral, 2 times a day (standard)   TRELEGY ELLIPTA 200-62.5-25 mcg DsDv 1 puff, Inhalation, Daily       Designated Healthcare Decision Maker:  Mr. Tingler currently has decisional capacity for healthcare decision-making and is able to designate a surrogate healthcare decision maker. Mr. Kluger designated healthcare decision maker(s) is/are Flordia Hung (the patient's adult child) as denoted by stated patient preference.    Objective:   Physical Exam:  Temp:  [35.4 ??C (95.7 ??F)-36.8 ??C (98.2 ??F)] 36.8 ??C (98.2 ??F)  Pulse:  [81-115] 81  SpO2 Pulse:  [77-121] 79  Resp:  [16-20] 18  BP: (93-126)/(55-96) 105/58  SpO2:  [94 %-100 %] 94 %    Gen: Elderly man conversing in NAD  Eyes: Mild conjunctival injection bilaterally, EOMI  HENT: MMM  Heart: RRR, no M/R/G, cap refill <2s  Lungs: CTAB, no wheezes appreciated  Abdomen: Soft, nondistended, mild suprapubic tenderness, no CVA tenderness  Extremities: RLE slightly swollen compared to left though no pitting edema/erythema/tenderness to palpation  Neuro: Grossly symmetric, non-focal    Skin:  No rashes, lesions on clothed exam  Psych: Alert, oriented

## 2023-07-30 NOTE — Unmapped (Signed)
 [facility]  Emergency Department Provider Note     ED Clinical Impression     Final diagnoses:   Acute cystitis with hematuria (Primary)   Abdominal pain, unspecified abdominal location   Left renal mass   Hyperkalemia      HPI, Medical Decision Making, ED Course     HPI: 78 y.o. male who has a past medical history of Adenomatous colon polyp, Asthma (HHS-HCC), Cervical disc disorder (04/17/2013), Cervical stenosis of spinal canal (11/06/2019), COPD (chronic obstructive pulmonary disease), Diabetes mellitus (Dx 1990), Diverticulosis, Hemorrhoids, internal, HL (hearing loss), Hypertension, Lung cancer (1992), Pleural effusion, right (06/25/2022), PVD (posterior vitreous detachment), left eye, Sinusitis, and Temporomandibular joint disorders (01/15/2012). who presents with abdominal pain and increased urinary frequency that started approximately 2 to weeks ago.  He states that he has had increased urinary frequency with multiple urinary movements in an hour .  Urine is typically pink in color feels different than normal.  He denies any fevers or back pain.  He does admit to some increased abdominal discomfort but denies any fevers, chills, nausea or vomiting.    Labs were obtained by his PCM at the [facility] clinic which confirmed evidence of hyperkalemia, a urinary tract infection in addition to evidence of worsening acute kidney injury, acutely worse than his CKD stage III.    DDx/MDM: Patient presenting with 2 weeks of dysuria consistent with urinary tract infection, acute hyperkalemia, and now showing evidence of acute renal injury with worsening creatinine and GFR.  Differential to include UTI, obstructive nephrolithiasis, infected stone, metastatic cancer secondary to his known history of lung and renal cancer.  Laboratory studies is obtained and seen below.  Patient's elevated potassium concerning for arrhythmia, though he is not actively having symptoms of chest pain, shortness of breath or palpitations. EKG shows no evidence of acute arrhythmia.  2 g calcium gluconate provided followed by IV fluids followed by initiation of intracellular potassium shift with glucose and insulin.  Urinalysis confirms presence of a urinary tract infection and ceftriaxone is administered.  Noncontrasted CT scan shows evidence of a left 1.1 cm hyperattenuating structure along the left kidney concerning for cyst versus malignant lesion.    Diagnostic workup as below. Will treat patient with potassium shift, calcium gluconate, ceftriaxone for urinary tract infection, and admission.    Orders Placed This Encounter   Procedures    Commode chair    Blood Culture #1    Blood Culture #2    Urine Culture    CT Abdomen Pelvis Wo Contrast    CBC w/ Differential    Basic metabolic panel    Magnesium    Urinalysis with Microscopy with Culture Reflex (Clean Catch)    Phosphorus    Uric acid    LDH, Lactate dehydrogenase    Blood Gas, Venous    Basic metabolic panel    Basic Metabolic Panel    Magnesium Level    Creatinine, Urine    Sodium, Random Urine    Basic Metabolic Panel    CBC w/ Differential    Nutrition Therapy Regular/House; Potassium Restricted (3 gm K+)    Measure post void residual    Vital signs    Notify Provider    Notify Provider    Measure height    Weigh patient    Notify Provider    Indwelling urinary catheter - Insert    Remote Tech Telemetry Monitoring    Strict intake and output    Indwelling urinary catheter -  Discontinue    DNR (Do Not Resuscitate) and DNI (Do Not Intubate)    Inpatient consult to Nephrology    Consult to Nutrition Services    ECG 12 Lead    ECG 12 Lead    ECG 12 Lead    Place Patient in Bed    ED Admit Decision    PVL Venous Duplex Lower Extremity Right       ED Course  ED Course as of 07/31/23 1011   Mon Jul 30, 2023   2000 No evidence of significant leukocytosis or anemia, no evidence of significant pH imbalance.  LDH within normal limits and uric acid mildly elevated.  BMP shows evidence of a hyperkalemia with elevated creatinine and decreased GFR suggestive of acute on chronic AKI.  Glucose elevated to 246 but there is no evidence of an anion gap.   2120 Urinalysis with Microscopy with Culture Reflex (Clean Catch)(!):    Color, UA Light Yellow   Clarity, UA Turbid   Spec Grav, UA 1.014   pH, UA 5.5   Leukocyte Esterase, UA Large(!)   Nitrite, UA Negative   Protein, UA Trace(!)   Glucose, UA >1000 mg/dL(!)   Ketones, UA Negative   Urobilinogen, UA <2.0 mg/dL   Bilirubin, UA Negative   Blood, UA Negative   RBC, UA 16(!)   WBC, UA >182(!)   Squam Epithel, UA <1   Bacteria, UA None Seen   WBC Clumps Rare(!)   Renal Epithel, UA <1(!)   Mucus, UA Rare(!)  Urinalysis with evidence of significant leuk esterases, white blood cells, blood, white blood cell clumps.   2244 CT Abdomen Pelvis Wo Contrast  No evidence of acute intra-abdominal/pelvic pathology.     New exophytic left upper pole hyperattenuating lesion measuring 1.1 cm which could represent the proteinaceous cyst or renal cell carcinoma. Further evaluation with nonemergent/MRI is recommended.        2244 Informed family of the findings of new renal mass in addition to confirmed hyperkalemia now improving, and urinary tract infection.  Patient to be admitted.         Independent Interpretation of Studies: I have independently interpreted the following studies:  Interpretation of studies as seen above    Discussion of Management With Other Providers or Support Staff: I discussed the management of this patient with the:  Discussed with attending physician    Considerations Regarding Disposition/Escalation of Care and Critical Care:  Patient to be admitted    Additional History Elements     Chief Complaint  Chief Complaint   Patient presents with    Abnormal Lab     Additional Historian(s): a family member    Past Medical History:   Diagnosis Date    Adenomatous colon polyp     Asthma (HHS-HCC)     Cervical disc disorder 04/17/2013    cervical spine MRI from February 2014 discharge DJD and foraminal stenosis at C4- 5, C3- 4 and C6- 7.     Cervical stenosis of spinal canal 11/06/2019    COPD (chronic obstructive pulmonary disease)       Diabetes mellitus   Dx 1990    Type II    Diverticulosis     Hemorrhoids, internal     HL (hearing loss)     Hypertension     Lung cancer   1992    Pleural effusion, right 06/25/2022    PVD (posterior vitreous detachment), left eye     Sinusitis  Temporomandibular joint disorders 01/15/2012       Past Surgical History:   Procedure Laterality Date    CATARACT EXTRACTION W/ INTRAOCULAR LENS IMPLANT Left 02/13/2006    Hecker MD    CATARACT EXTRACTION W/ INTRAOCULAR LENS IMPLANT Right 03/27/2006    Lasandra Points MD    CHOLECYSTECTOMY      HERNIA REPAIR      KIDNEY STONE SURGERY      LUNG LOBECTOMY Left 1992    PR ALLOGRAFT FOR SPINE SURGERY ONLY MORSELIZED N/A 11/06/2019    Procedure: ALLOGRAFT FOR SPINE SURGERY ONLY; MORSELIZED;  Surgeon: Kristie Pew, MD;  Location: [facility] OR [facility];  Service: Orthopedics    PR ARTHRD PST/PSTLAT TQ 1NTRSPC CRV BELW C2 SEGMENT N/A 11/06/2019    Procedure: ARTHRODESIS, POSTERIOR OR POSTEROLATERAL TECHNIQUE, SINGLE LEVEL; CERVICAL BELOW C2 SEGMENT;  Surgeon: Kristie Pew, MD;  Location: [facility] OR [facility];  Service: Orthopedics    PR ARTHRODESIS PST/PSTLAT TQ 1NTRSPC EA ADDL NTRSPC N/A 11/06/2019    Procedure: ARTHRODESIS, POSTERIOR OR POSTEROLATERAL TECHNIQUE, SINGLE LEVEL; EACH ADDITIONAL VERTEBRAL SEGMENT   x4;  Surgeon: Kristie Pew, MD;  Location: [facility] OR [facility];  Service: Orthopedics    PR AUTOGRAFT SPINE SURGERY LOCAL FROM SAME INCISION N/A 11/06/2019    Procedure: AUTOGRAFT/SPINE SURG ONLY (W/HARVEST GRAFT); LOCAL (EG, RIB/SPINOUS PROC, LAM FRGMT) OBTAIN FROM SAME INCIS;  Surgeon: Kristie Pew, MD;  Location: [facility] OR [facility];  Service: Orthopedics    PR BRNSCHSC TNDSC EBUS DX/TX INTERVENTION [facility] LES Right 11/21/2021    Procedure: BRONCH, RIGID OR FLEXIBLE, INCLUDING FLUORO GUIDANCE, WHEN PERFORMED; WITH TRANSENDOSCOPIC EBUS DURING BRONCHOSCOPIC DIAGNOSTIC OR THERAPEUTIC INTERVENTION(S) FOR PERIPHERAL LESION(S);  Surgeon: Vera Gip, MD;  Location: MAIN OR [place];  Service: Pulmonary    PR BRONCHOSCOPY,COMPUTER ASSIST/IMAGE-GUIDED NAVIGATION N/A 11/21/2021    Procedure: ROBOT ION BRONCHOSCOPY,RIGID OR FLEXIBLE,INCLUDE FLUORO WHEN PERFORMED; W/COMPUTER-ASSIST,IMAGE-GUIDED NAVIGATION;  Surgeon: Vera Gip, MD;  Location: MAIN OR [place];  Service: Pulmonary    PR BRONCHOSCOPY,COMPUTER ASSIST/IMAGE-GUIDED NAVIGATION N/A 11/21/2021    Procedure: BRONCHOSCOPY, RIGID OR FLEXIBLE, INCLUDE FLUORO WHEN PERFORMED; W/COMPUTER-ASSIST, IMAGE-GUIDED NAVIGATION;  Surgeon: Vera Gip, MD;  Location: MAIN OR [place];  Service: Pulmonary    PR BRONCHOSCOPY,DIAGNOSTIC W LAVAGE Right 06/27/2022    Procedure: BRONCHOSCOPY, RIGID OR FLEXIBLE, INCLUDE FLUOROSCOPIC GUIDANCE WHEN PERFORMED; W/BRONCHIAL ALVEOLAR LAVAGE;  Surgeon: Filbert Huff, MD;  Location: MAIN OR [place];  Service: Pulmonary    PR BRONCHOSCOPY,PLACEMENT FIDUCIAL MARKERS, 1/MULT N/A 11/21/2021    Procedure: BRONCHOSCOPY, RIGID OR FLEXIBLE, FLOURO WHEN PERFORMED; PLACEMENT OF FIDUCIAL MARKERS, SINGLE OR MULTIPLE;  Surgeon: Vera Gip, MD;  Location: MAIN OR [place];  Service: Pulmonary    PR BRONCHOSCOPY,TRANSBRON ASPIR BX Right 11/21/2021    Procedure: BRONCHOSCOPY, RIGID/FLEX, INCL FLUORO; W/TRANSBRONCH NDL ASPIRAT BX, TRACHEA, MAIN STEM &/OR LOBAR BRONCHUS;  Surgeon: Vera Gip, MD;  Location: MAIN OR [place];  Service: Pulmonary    PR BRONCHOSCOPY,TRANSBRONCH BIOPSY Right 11/21/2021    Procedure: BRONCHOSCOPY, RIGID/FLEXIBLE, INCLUDE FLUORO GUIDANCE WHEN PERFORMED; W/TRANSBRONCHIAL LUNG BX, SINGLE LOBE;  Surgeon: Vera Gip, MD;  Location: MAIN OR [place];  Service: Pulmonary    PR C-LAMINOPLASTY W/GRAFT/PLATE, 2 OR MORE N/A 11/06/2019    Procedure: LAMINOPLASTY, CERVICAL, DECOMPRESS SPINAL CORD, 2+ VERTEBRAL SEGMENTS; W/RECONSTRUCT POSTERIOR BONY ELEMENT;  Surgeon: Kristie Pew, MD;  Location: [facility] OR [facility];  Service: Orthopedics    PR COLONOSCOPY W/BIOPSY SINGLE/MULTIPLE N/A 07/05/2017    Procedure: COLONOSCOPY, FLEXIBLE, PROXIMAL TO SPLENIC  FLEXURE; WITH BIOPSY, SINGLE OR MULTIPLE;  Surgeon: Marsh Skeans, MD;  [facility];  Service: Gastroenterology    PR IONM 1 ON 1 IN OR W/ATTENDANCE EACH 15 MINUTES N/A 11/06/2019    Procedure: CONTINUOUS INTRAOPERATIVE NEUROPHYSIOLOGY MONITORING IN OR;  Surgeon: Kristie Pew, MD;  Location: [facility] OR [facility];  Service: Orthopedics    PR LAM W/O FACETEC FORAMOT/DSKC 1/2 VRT SEG, CERVICAL N/A 11/06/2019    Procedure: LAMINECT W/EXPLOR WO FACETECT 1-2 VERTEB; CERV;  Surgeon: Kristie Pew, MD;  Location: [facility] OR [facility];  Service: Orthopedics    PR POSTERIOR SEGMENTAL INSTRUMENTATION 3-6 VRT SEG N/A 11/06/2019    Procedure: POST SEGMT INSTRUM; 3 TO 6 VERTEB SEGMT CERVICAL;  Surgeon: Kristie Pew, MD;  Location: [facility] OR [facility];  Service: Orthopedics    SINUS SURGERY         Allergies  Latex    Family History  Family History   Problem Relation Age of Onset    Cancer Daughter     Strabismus Daughter     Hypertension Daughter     No Known Problems Mother     No Known Problems Father     No Known Problems Sister     No Known Problems Brother     No Known Problems Maternal Aunt     No Known Problems Maternal Uncle     No Known Problems Paternal Aunt     No Known Problems Paternal Uncle     No Known Problems Maternal Grandmother     No Known Problems Maternal Grandfather     No Known Problems Paternal Grandmother     No Known Problems Paternal Grandfather     Amblyopia Neg Hx     Blindness Neg Hx     Cataracts Neg Hx     Diabetes Neg Hx     Glaucoma Neg Hx     Macular degeneration Neg Hx     Retinal detachment Neg Hx     Stroke Neg Hx     Thyroid disease Neg Hx        Social History  Social History     Tobacco Use    Smoking status: Former     Current packs/day: 0.00     Average packs/day: 1 pack/day for 35.0 years (35.0 ttl pk-yrs)     Types: Cigarettes     Start date: 02/19/1953     Quit date: 08/12/1987     Years since quitting: 35.9    Smokeless tobacco: Current     Types: Chew   Vaping Use    Vaping status: Never Used   Substance Use Topics    Alcohol use: No     Alcohol/week: 0.0 standard drinks of alcohol    Drug use: No        Physical Exam     VITAL SIGNS:      Vitals:    07/31/23 0400 07/31/23 0439 07/31/23 0506 07/31/23 0929   BP: 111/57  136/68 128/68   Pulse: 73  72 86   Resp: 17  17    Temp:   36.4 ??C (97.5 ??F) 36.5 ??C (97.7 ??F)   TempSrc:   Oral Oral   SpO2: 98%  98%    Weight:  56.7 kg (125 lb)     Height:  165.1 cm (5' 5)       Constitutional: Alert and oriented. No acute distress.  Eyes: Conjunctivae are normal.  HEENT: Normocephalic and atraumatic. No congestion. Moist mucous membranes.   Cardiovascular: Rate as above, regular rhythm. Normal and symmetric distal pulses.  Respiratory: Normal respiratory effort. Breath sounds are normal. There are no wheezing or crackles heard.  Gastrointestinal: Soft, non-distended, mild tenderness to palpation in the right abdomen.  Genitourinary: Deferred.  Musculoskeletal: Non-tender with normal range of motion in all extremities.  Neurologic: Normal speech and language. No gross focal neurologic deficits are appreciated. Patient is moving all extremities equally, face is symmetric at rest and with speech.  Skin: Skin is warm, dry and intact. No rash noted.  Psychiatric: Mood and affect are normal. Speech and behavior are normal.     Radiology     PVL Venous Duplex Lower Extremity Right         CT Abdomen Pelvis Wo Contrast   Final Result   No evidence of acute intra-abdominal/pelvic pathology.      New exophytic left upper pole hyperattenuating lesion measuring 1.1 cm which could represent the proteinaceous cyst or renal cell carcinoma. Further evaluation with nonemergent/MRI is recommended.         FOLLOW-UP RECOMMENDATION:      Item for Follow Up:   1. Acuity: Non-urgent   2. Modality: MR   3. Anatomy: Abdomen   4. TimeFrame: 3 Months             Pertinent labs & imaging results that were available during my care of the patient were independently interpreted by me and considered in my medical decision making (see chart for details).    Portions of this record have been created using Scientist, clinical (histocompatibility and immunogenetics). Dictation errors have been sought, but may not have been identified and corrected.         Jan Olano S, DO  Resident  07/31/23 [phone]

## 2023-07-30 NOTE — Unmapped (Addendum)
 Lee Baxter is a 78 y.o. male with pertinent PMHx of asthma-COPD overlap, HTN, CKD 3a, T2DM, HLD, GERD, BPH, prior LUL adenocarcinoma (s/p partial lobectomy), and RLL NSCLC (s/p radiation c/b radiation pneumonitis) who presented to [facility] due to hyperkalemia and AKI identified on outpatient labs in the setting of 2 weeks of urinary frequency/hesitancy/incomplete voiding, mild dysuria, and mild suprapubic discomfort, also found to have Right renal mass.     Urinary retention (resolved) - BPH  Patient with known BPH (on tamsulosin and finasteride), presented with 2 weeks of urinary hesitancy/urgency/incomplete voiding, found to have PVR of >234mL after voiding in the ED, all of which was concerning for urinary retention. Additionally, reported having infrequent, hard, ball-like stools, so suspect constipation was playing a role. No hydronephrosis seen on CT, but given convincing history, foley was placed with resolution of obstruction. The day following admission, foley was removed and patient passed his trial of void and stated that he no longer had the sensation of incomplete emptying.     AKI on  CKD 3a (improving)  Creatinine on presentation elevated to 2.47 from baseline ~1.3 (though has fluctuated significantly up to ~1.6 in the past). Suspected to be post-renal in etiology due to bladder outlet obstruction as above, though patient presented with poor PO intake so received 1L NS on arrival. His creatinine steadily improved after fluids and placement of foley with relief of obstruction. On discharge, he was making good urine without retention, creatinine was downtrending, and his LUTS were significantly improving.     Complicated cystitis  Patient presenting with suprapubic pain and mild possible dysuria in setting of suspected retention, along with UA with pyuria/large leuk esterase, positive nitrites, consistent with cystitis, though no CVA tenderness, fever, or leukocytosis to suggest pyelonephritis. No reported or documented history of urinary tract infections in the past. Suspect bladder outlet obstruction was precipitant for retrograde bacterial invasion. He received one dose of ceftriaxone in ED, switched to cephalexin 500 mg BID to start on 5/20 for total 7 day course of antibiotics (including ceftriaxone). Received first day's doses inpatient and will complete course as outpatient (5 more days).      Hyperkalemia (improving)  K 7 on outpatient labs, improved to 6 on presentation to the ED, and 5.4 on recheck s/p insulin/glucose. Suspect due to AKI as above, as well as home lisinopril. No chest pain or palpitations. ECG with NSR without stigmata of hyperkalemia (baseline borderline prolonged PR interval), though slight diffuse notching of QRS complex noted- unclear significance. He additionally received a 10g dose of Lokelma with improvement of his potassium to 5.1 prior to discharge. Will follow up with PCP for lab recheck.    Right renal lesion  Patient with 1.1 cm exophytic left upper pole hyperattenuating lesion noted on CT A/P, with differential for radiology including proteinaceous cyst or RCC. Recommended non-urgent MRI abdomen/pelvis for better characterization. Patient also has reported ~3 months of weight loss (though on review of weight trend, appears he has ranged from 122-134 lbs over the past year, currently 123lbs).  Additionally, he has had increasing borderline hypercalcemia, which could go along with RCC. On discharge, advised PCP follow-up for further imaging/work-up. Ambulatory referral to urology also placed.     Constipation (resolved)  Patient presented with worsening constipation, which may have been contributing to his urinary symptoms as above. Received a SMOG enema with immediate resolution. Encouraged continued home Miralax use as directed by his PCP.    Advanced care planning  Confirmed with  patient on this admission that he would not want resuscitation or intubation in the setting of cardiopulmonary arrest. His code status was changed to DNR/DNI.     Chronic Problems  HTN: home lisinopril held both during admission and on discharge in setting of AKI/hyperkalemia. Plan for PCP follow-up to discuss antihypertensive regimen.  COPD-Asthma: Continue home inhaler regimen.  T2DM: Metformin held on admission in setting of AKI. Resumed home regimen on discharge.  HLD: Continue home atorvastatin 40mg .  Dry eyes: Continue home Theratears TID.

## 2023-07-30 NOTE — Unmapped (Signed)
 [facility] at [facility]     Reason for visit: Follow up    Questions / Concerns that need to be addressed: abdominal pain    Screening BP- 126/84      PTHomeBP       Dexcom or Libre CGM in use? If so, pull appropriate reporting through portal (Dexcom) or EPIC order [name]).    HCDM reviewed and updated in Epic:    We are working to make sure all of our patients??? wishes are updated in Epic and part of that is documenting a Environmental health practitioner for each patient  A Health Care Decision Maker is someone you choose who can make health care decisions for you if you are not able - who would you most want to do this for you????  is already up to date.    HCDM (patient stated preference): Lee Baxter - Daughter - [phone]    BPAs completed:  Diabetes - Foot Exam    Annual Screenings:   SDOH , Tobacco, Audit/Alcohol , Substance Use, Domestic Abuse, and Health Literacy  __________________________________________________________________________________________    SCREENINGS COMPLETED IN FLOWSHEETS      AUDIT  AUDIT - C Score (Part 1): 0    PHQ2  PHQ-2 Total Score : 0    PHQ9          GAD7       COPD Assessment       Falls Risk

## 2023-07-30 NOTE — Unmapped (Signed)
 Patient and family informed of this abdominal x-ray results showing ileus versus early obstruction and mild to moderate colonic stool burden.  Patient is currently at the [facility] ED for evaluation of AKI on CKD and hyperkalemia.

## 2023-07-30 NOTE — Unmapped (Signed)
 Spoke with patient's daughter and left detailed VM with patient.  His potassium level is very high (7) and he has an acute kidney on chronic kidney dysfunction that may be due to UTI/prostatitis given abnormal urinalysis and symptomatology.  Advised that he go to [facility] emergency department today for evaluation, treatment and possible admission.  Patient's daughter acknowledged this and will take him to the emergency department.

## 2023-07-30 NOTE — Unmapped (Signed)
 1.  Start taking the senna twice a day for 3 days then 1 a day until I tell you to stop.  2.  Start taking your MiraLAX powder and water/juice or coffee twice a day for the first 3 days then 1 dose a day.  3.  Increase your tamsulosin 0.4 mg to twice a day, new prescription was sent to your pharmacy.  Continue with your finasteride pill for the prostate.

## 2023-07-30 NOTE — Unmapped (Signed)
 Patient and family informed that this chest x-ray did not show any concerning finding.

## 2023-07-31 LAB — CBC W/ AUTO DIFF
BASOPHILS ABSOLUTE COUNT: 0.1 10*9/L (ref 0.0–0.1)
BASOPHILS RELATIVE PERCENT: 1 %
EOSINOPHILS ABSOLUTE COUNT: 0.5 10*9/L (ref 0.0–0.5)
EOSINOPHILS RELATIVE PERCENT: 5 %
HEMATOCRIT: 36.7 % — ABNORMAL LOW (ref 39.0–48.0)
HEMOGLOBIN: 12.5 g/dL — ABNORMAL LOW (ref 12.9–16.5)
LYMPHOCYTES ABSOLUTE COUNT: 1.2 10*9/L (ref 1.1–3.6)
LYMPHOCYTES RELATIVE PERCENT: 13 %
MEAN CORPUSCULAR HEMOGLOBIN CONC: 34.1 g/dL (ref 32.0–36.0)
MEAN CORPUSCULAR HEMOGLOBIN: 31.7 pg (ref 25.9–32.4)
MEAN CORPUSCULAR VOLUME: 93.2 fL (ref 77.6–95.7)
MEAN PLATELET VOLUME: 8.8 fL (ref 6.8–10.7)
MONOCYTES ABSOLUTE COUNT: 0.9 10*9/L — ABNORMAL HIGH (ref 0.3–0.8)
MONOCYTES RELATIVE PERCENT: 10 %
NEUTROPHILS ABSOLUTE COUNT: 6.6 10*9/L (ref 1.8–7.8)
NEUTROPHILS RELATIVE PERCENT: 71 %
PLATELET COUNT: 261 10*9/L (ref 150–450)
RED BLOOD CELL COUNT: 3.94 10*12/L — ABNORMAL LOW (ref 4.26–5.60)
RED CELL DISTRIBUTION WIDTH: 13.5 % (ref 12.2–15.2)
WBC ADJUSTED: 9.4 10*9/L (ref 3.6–11.2)

## 2023-07-31 LAB — BASIC METABOLIC PANEL
ANION GAP: 9 mmol/L (ref 5–14)
ANION GAP: 2 mmol/L — ABNORMAL LOW (ref 5–14)
BLOOD UREA NITROGEN: 48 mg/dL — ABNORMAL HIGH (ref 9–23)
BLOOD UREA NITROGEN: 42 mg/dL — ABNORMAL HIGH (ref 9–23)
BUN / CREAT RATIO: 23
BUN / CREAT RATIO: 22
CALCIUM: 9.6 mg/dL (ref 8.7–10.4)
CALCIUM: 9.6 mg/dL (ref 8.7–10.4)
CHLORIDE: 110 mmol/L — ABNORMAL HIGH (ref 98–107)
CHLORIDE: 110 mmol/L — ABNORMAL HIGH (ref 98–107)
CO2: 23 mmol/L (ref 20.0–31.0)
CO2: 24 mmol/L (ref 20.0–31.0)
CREATININE: 2.13 mg/dL — ABNORMAL HIGH (ref 0.73–1.18)
CREATININE: 1.9 mg/dL — ABNORMAL HIGH (ref 0.73–1.18)
EGFR CKD-EPI (2021) MALE: 31 mL/min/1.73m2 — ABNORMAL LOW (ref >=60)
EGFR CKD-EPI (2021) MALE: 35 mL/min/1.73m2 — ABNORMAL LOW (ref >=60)
GLUCOSE RANDOM: 179 mg/dL (ref 70–179)
GLUCOSE RANDOM: 149 mg/dL (ref 70–179)
POTASSIUM: 5.8 mmol/L — ABNORMAL HIGH (ref 3.4–4.8)
POTASSIUM: 5.1 mmol/L — ABNORMAL HIGH (ref 3.4–4.8)
SODIUM: 142 mmol/L (ref 135–145)
SODIUM: 136 mmol/L (ref 135–145)

## 2023-07-31 LAB — CREATININE, URINE: CREATININE, URINE: 79 mg/dL

## 2023-07-31 LAB — SODIUM, URINE, RANDOM: SODIUM URINE: 105 mmol/L

## 2023-07-31 LAB — MAGNESIUM: MAGNESIUM: 1.9 mg/dL (ref 1.6–2.6)

## 2023-07-31 MED ORDER — SENNOSIDES 8.6 MG TABLET
ORAL_TABLET | Freq: Every evening | ORAL | 0 refills | 30.00000 days | Status: CP
Start: 2023-07-31 — End: 2023-08-30

## 2023-07-31 MED ORDER — OXYCODONE-ACETAMINOPHEN 5 MG-325 MG TABLET
ORAL_TABLET | ORAL | 0 refills | 25.00000 days | Status: CP | PRN
Start: 2023-07-31 — End: ?

## 2023-07-31 MED ORDER — IPRATROPIUM 0.5 MG-ALBUTEROL 3 MG (2.5 MG BASE)/3 ML NEBULIZATION SOLN
Freq: Four times a day (QID) | RESPIRATORY_TRACT | 1 refills | 90.00000 days | Status: CP
Start: 2023-07-31 — End: 2024-01-27

## 2023-07-31 MED ORDER — CEPHALEXIN 500 MG CAPSULE
ORAL_CAPSULE | Freq: Two times a day (BID) | ORAL | 0 refills | 5.00000 days | Status: CP
Start: 2023-07-31 — End: 2023-08-05

## 2023-07-31 MED ORDER — BLOOD-GLUCOSE METER KIT WRAPPER
ORAL | 11 refills | 0.00000 days | Status: CP
Start: 2023-07-31 — End: 2024-07-30

## 2023-07-31 MED ORDER — POLYETHYLENE GLYCOL 3350 17 GRAM ORAL POWDER PACKET
PACK | Freq: Two times a day (BID) | ORAL | 0 refills | 30.00000 days | Status: CP
Start: 2023-07-31 — End: 2023-08-30

## 2023-07-31 MED ADMIN — lidocaine 2% gel (XYLOCAINE) jelly urojet 20 mL: 20 mL | URETHRAL | @ 06:00:00 | Stop: 2023-07-31

## 2023-07-31 MED ADMIN — sodium chloride 3 % NEBULIZER solution 4 mL: 4 mL | RESPIRATORY_TRACT | @ 13:00:00 | Stop: 2023-07-31

## 2023-07-31 MED ADMIN — finasteride (PROSCAR) tablet 5 mg: 5 mg | ORAL | @ 13:00:00 | Stop: 2023-07-31

## 2023-07-31 MED ADMIN — umeclidinium (INCRUSE ELLIPTA) 62.5 mcg/actuation inhaler 1 puff: 1 | RESPIRATORY_TRACT | @ 13:00:00 | Stop: 2023-07-31

## 2023-07-31 MED ADMIN — fluticasone furoate-vilanterol (BREO ELLIPTA) 200-25 mcg/dose inhaler 1 puff: 1 | RESPIRATORY_TRACT | @ 13:00:00 | Stop: 2023-07-31

## 2023-07-31 MED ADMIN — insulin lispro (HumaLOG) injection 0-20 Units: 0-20 [IU] | SUBCUTANEOUS | @ 16:00:00 | Stop: 2023-07-31

## 2023-07-31 MED ADMIN — carboxymethylcellulose sodium (THERATEARS) 0.25 % ophthalmic solution 1 drop: 1 [drp] | OPHTHALMIC | @ 18:00:00 | Stop: 2023-07-31

## 2023-07-31 MED ADMIN — tamsulosin (FLOMAX) 24 hr capsule 0.4 mg: .4 mg | ORAL | @ 05:00:00 | Stop: 2023-07-31

## 2023-07-31 MED ADMIN — SMOG ENEMA: 240 mL | RECTAL | @ 14:00:00 | Stop: 2023-07-31

## 2023-07-31 MED ADMIN — polyethylene glycol (MIRALAX) packet 17 g: 17 g | ORAL | @ 11:00:00 | Stop: 2023-07-31

## 2023-07-31 MED ADMIN — atorvastatin (LIPITOR) tablet 40 mg: 40 mg | ORAL | @ 05:00:00 | Stop: 2023-07-31

## 2023-07-31 MED ADMIN — cephalexin (KEFLEX) capsule 500 mg: 500 mg | ORAL | @ 13:00:00 | Stop: 2023-07-31

## 2023-07-31 MED ADMIN — sodium chloride 3 % NEBULIZER solution 4 mL: 4 mL | RESPIRATORY_TRACT | @ 05:00:00 | Stop: 2023-07-31

## 2023-07-31 MED ADMIN — heparin (porcine) 5,000 unit/mL injection 5,000 Units: 5000 [IU] | SUBCUTANEOUS | @ 13:00:00 | Stop: 2023-07-31

## 2023-07-31 MED ADMIN — cephalexin (KEFLEX) capsule 500 mg: 500 mg | ORAL | @ 22:00:00 | Stop: 2023-07-31

## 2023-07-31 MED ADMIN — sodium zirconium cyclosilicate (LOKELMA) packet 10 g: 10 g | ORAL | @ 11:00:00 | Stop: 2023-07-31

## 2023-07-31 MED ADMIN — sodium chloride 0.9% (NS) bolus 1,000 mL: 1000 mL | INTRAVENOUS | @ 06:00:00 | Stop: 2023-07-31

## 2023-07-31 MED ADMIN — insulin lispro (HumaLOG) injection 0-20 Units: 0-20 [IU] | SUBCUTANEOUS | @ 11:00:00 | Stop: 2023-07-31

## 2023-07-31 MED ADMIN — carboxymethylcellulose sodium (THERATEARS) 0.25 % ophthalmic solution 1 drop: 1 [drp] | OPHTHALMIC | @ 13:00:00 | Stop: 2023-07-31

## 2023-07-31 NOTE — Unmapped (Incomplete)
 The patient was seen being transferred via wheelchair by the transport. He was observed     Shift Summary   Urinary retention required intervention with a catheter due to significant post-void volume.    Blood pressure showed fluctuations but stabilized by the end of the shift.    Oral intake was adequate after a blood glucose drop, with the patient consuming all provided food and juice.    Telemetry alarms were set and monitored throughout the shift.    Overall, the patient's condition showed improvement in blood pressure stability and effective urinary elimination was achieved with catheterization.     Fluid and Electrolyte Balance: Blood pressure fluctuated throughout the shift, with a notable drop at midnight, but stabilized by the end of the shift. MAP values followed a similar pattern, indicating some instability but eventual improvement.     Improved Oral Intake: Oral intake was supported with food and juice after a blood glucose drop, and the patient was able to consume all provided nourishment.     Effective Urinary Elimination: Urinary retention was noted with increased frequency, and a post-void bladder scan showed significant retention, leading to catheterization.     Blood Pressure in Desired Range: Blood pressure was initially low but improved towards the end of the shift, reaching a more stable range.

## 2023-07-31 NOTE — Unmapped (Signed)
 The patient was seen being wheeled to his room via wheelchair by the transport. He was noted to have been an Indwelling catheter hooked to a urine bag draining well. Patient is currently receiving fluid replacement (to consume). He is able to verbalize his needs. Patient hooked to telemetry. Vital signs taken and recorded. Monitored accordingly. Needs attended. Blood sugar monitored. Placed call light within reach. Plan of care ongoing.     Shift Summary   Urinary retention required intervention with a catheter due to significant post-void volume.    Blood pressure showed fluctuations but stabilized by the end of the shift.    Oral intake was adequate after a blood glucose drop, with the patient consuming all provided food and juice.    Telemetry alarms were set and monitored throughout the shift.    Overall, the patient's condition showed improvement in blood pressure stability and effective urinary elimination was achieved with catheterization.     Fluid and Electrolyte Balance: Blood pressure fluctuated throughout the shift, with a notable drop at midnight, but stabilized by the end of the shift. MAP values followed a similar pattern, indicating some instability but eventual improvement.     Improved Oral Intake: Oral intake was supported with food and juice after a blood glucose drop, and the patient was able to consume all provided nourishment.     Effective Urinary Elimination: Urinary retention was noted with increased frequency, and a post-void bladder scan showed significant retention, leading to catheterization.     Blood Pressure in Desired Range: Blood pressure was initially low but improved towards the end of the shift, reaching a more stable range.

## 2023-07-31 NOTE — Unmapped (Signed)
 Shift Summary   Urinary output monitored after removal of Foley catheter, good urine output and post void residual monitored.   Pt received Enema and had large bowel movement.    Pain management was effective, with no reported pain throughout the shift.    Fall risk interventions were consistently applied, with no falls or injuries reported.    Oral intake was stable, with the patient able to feed himself throughout the shift.    Overall, the patient maintained stable comfort and safety, but urinary elimination issues persist.   Pt to be discharged home.    Absence of Hospital-Acquired Illness or Injury: Pressure-redistributing mattress was consistently utilized throughout the shift to prevent pressure injuries.     Optimal Comfort and Wellbeing: Pain was consistently reported as 0 on the pain scale, indicating effective pain management.     Absence of Fall and Fall-Related Injury: Side rails were maintained at 2/4 throughout the shift, and hourly visual checks confirmed the patient was awake and in bed, minimizing fall risk.     Improved Oral Intake: The patient was able to feed himself consistently throughout the shift, indicating stable oral intake.     Effective Urinary Elimination: Urinary output decreased significantly from 600 mL to 100 mL, and post-void catheter residual was high at 554 mL, indicating incomplete bladder emptying.

## 2023-07-31 NOTE — Unmapped (Addendum)
 Care Management  Initial Transition Planning Assessment    CM talked with patient at bedside to complete assessment as well as to confirm all information pertaining to any discharge planning needs. CM introduced self, explained role, and confirmed demographic information.Patient lives with alone in [facility]) in a 1 level home with 3 steps to enter.  At baseline patient is independent with ADLs. He does not receive home health. DME is none.  Flordia Hung (daughter)  will provide transportation and will assist with basic care at home.    Type of Residence: Mailing Address:  [address] [place] [address]  Contacts: Accompanied by: Alone  Password: declines  Patient Phone Number: [phone] (home) [phone] (work)         Medical Provider(s): Lucy Sack, MD  Reason for Admission: Admitting Diagnosis:  Hyperkalemia [E87.5]  Left renal mass [N28.89]  Acute cystitis with hematuria [N30.01]  Abdominal pain, unspecified abdominal location [R10.9]  Past Medical History:   has a past medical history of Adenomatous colon polyp, Asthma (HHS-HCC), Cervical disc disorder (04/17/2013), Cervical stenosis of spinal canal (11/06/2019), COPD (chronic obstructive pulmonary disease), Diabetes mellitus (Dx 1990), Diverticulosis, Hemorrhoids, internal, HL (hearing loss), Hypertension, Lung cancer (1992), Pleural effusion, right (06/25/2022), PVD (posterior vitreous detachment), left eye, Sinusitis, and Temporomandibular joint disorders (01/15/2012).  Past Surgical History:   has a past surgical history that includes Cholecystectomy; Kidney stone surgery; Sinus surgery; Cataract extraction w/ intraocular lens implant (Left, 02/13/2006); Cataract extraction w/ intraocular lens implant (Right, 03/27/2006); Lung lobectomy (Left, 1992); pr colonoscopy w/biopsy single/multiple (N/A, 07/05/2017); Hernia repair; pr c-laminoplasty w/graft/plate, 2 or more (N/A, 11/06/2019); pr lam w/o facetec foramot/dskc 1/2 vrt seg, cervical (N/A, 11/06/2019); pr arthrd pst/pstlat tq 1ntrspc crv belw c2 segment (N/A, 11/06/2019); pr arthrodesis pst/pstlat tq 1ntrspc ea addl ntrspc (N/A, 11/06/2019); pr posterior segmental instrumentation 3-6 vrt seg (N/A, 11/06/2019); pr allograft for spine surgery only morselized (N/A, 11/06/2019); pr autograft spine surgery local from same incision (N/A, 11/06/2019); pr ionm 1 on 1 in or w/attendance each 15 minutes (N/A, 11/06/2019); pr bronchoscopy,computer assist/image-guided navigation (N/A, 11/21/2021); pr bronchoscopy,computer assist/image-guided navigation (N/A, 11/21/2021); pr bronchoscopy,placement fiducial markers, 1/mult (N/A, 11/21/2021); pr bronchoscopy,transbronch biopsy (Right, 11/21/2021); pr bronchoscopy,transbron aspir bx (Right, 11/21/2021); pr brnschsc tndsc ebus dx/tx intervention perph les (Right, 11/21/2021); and pr bronchoscopy,diagnostic w lavage (Right, 06/27/2022).   Previous admit date: 06/25/2022    Primary Insurance- Payor: Advertising copywriter MEDICARE ADV / Plan: UNITED HEALTHCARE DUAL COMPLETE HMO / Product Type: *No Product type* /   Secondary Insurance - Secondary Insurance  MEDICAID [place]  Prescription Coverage -   Preferred Pharmacy - [id number] [place] [place] - [address] [facility]  [id number] [place] - [address]  [id number] - [place] - [address]  [facility]  [facility]    Transportation home: Energy manager / Social Worker assessed the patient by : In person interview with patient  Orientation Level: Oriented X4  Functional level prior to admission: Independent  Reason for referral: Discharge Planning    Contact/Decision Maker  Extended Emergency Contact Information  Primary Emergency Contact: Miguel Albino  Address: [address]  [place], [place] [address] [place]  of [employer]: [phone]  Relation: Daughter    Legal Next of Kin / Guardian / POA / Advance Directives     HCDM (patient stated preference): Miguel Albino - Daughter - [phone]    Advance Directive (Medical Treatment)  Does patient have an advance directive covering medical treatment?: Patient has advance directive covering medical treatment, copy not in chart.  Advance directive covering medical treatment not in Chart:: Copy requested from family  Reason patient does not have an advance directive covering medical treatment:: Patient does not wish to complete one at this time.    Health Care Decision Maker [HCDM] (Medical & Mental Health Treatment)  Healthcare Decision Maker: HCDM documented in the HCDM/Contact Info section.  Information offered on HCDM, Medical & Mental Health advance directives:: Patient declined information.    Advance Directive (Mental Health Treatment)  Does patient have an advance directive covering mental health treatment?: Patient does not have advance directive covering mental health treatment.  Reason patient does not have an advance directive covering mental health treatment:: Patient does not wish to complete one at this time.    Readmission Information    Have you been hospitalized in the last 30 days?: No        Patient Information  Lives with: Alone    Type of Residence: Private residence        Location/Detail: Patient address [address]  [place] [address]    Support Systems/Concerns: Children ([name] Rachel Budds (daughter))    Responsibilities/Dependents at home?: No    Home Care services in place prior to admission?: No  Type of Home Care services in place prior to admission: Home nursing visits  Current Home Care provider (Name/Phone #): [employer] Currently Used at [employer]: none       Currently receiving outpatient dialysis?: No       Financial Information       Need for financial assistance?: No       Social Determinants of Health  Social Drivers of Health     Food Insecurity: No Food Insecurity (07/31/2023)    Hunger Vital Sign     Worried About Running Out of Food in the Last Year: Never true     Ran Out of Food in the Last Year: Never true   Tobacco Use: High Risk (07/31/2023)    Patient History     Smoking Tobacco Use: Former     Smokeless Tobacco Use: Current     Passive Exposure: Not on file   Transportation Needs: No Transportation Needs (07/31/2023)    PRAPARE - Therapist, art (Medical): No     Lack of Transportation (Non-Medical): No   Alcohol Use: Not At Risk (07/30/2023)    Alcohol Use     How often do you have a drink containing alcohol?: Never     How many drinks containing alcohol do you have on a typical day when you are drinking?: 1 - 2     How often do you have 5 or more drinks on one occasion?: Never   Housing: Low Risk  (07/31/2023)    Housing     Within the past 12 months, have you ever stayed: outside, in a car, in a tent, in an overnight shelter, or temporarily in someone else's home (i.e. couch-surfing)?: No     Are you worried about  losing your housing?: No   Physical Activity: Not on file   Utilities: Low Risk  (07/31/2023)    Utilities     Within the past 12 months, have you been unable to get utilities (heat, electricity) when it was really needed?: No   Stress: Not on file   Interpersonal Safety: Not At Risk (07/30/2023)    Interpersonal Safety     Unsafe Where You Currently Live: No     Physically Hurt by Anyone: No     Abused by Anyone: No   Substance Use: Low Risk  (07/30/2023)    Substance Use     In the past year, how often have you used prescription drugs for non-medical reasons?: Never     In the past year, how often have you used illegal drugs?: Never     In the past year, have you used any substance for non-medical reasons?: No   Intimate Partner Violence: Not At Risk (07/30/2023)    Humiliation, Afraid, Rape, and Kick questionnaire     Fear of Current or Ex-Partner: No     Emotionally Abused: No     Physically Abused: No     Sexually Abused: No   Social Connections: Not on file   Financial Resource Strain: Low Risk  (07/31/2023)    Overall Financial Resource Strain (CARDIA)     Difficulty of Paying Living Expenses: Not hard at all   Health Literacy: Low Risk  (04/30/2023)    Health Literacy     : Never   Internet Connectivity: No Internet connectivity concern identified (04/30/2023)    Internet Connectivity     Do you have access to internet services: Yes     How do you connect to the internet: Personal Device at home     Is your internet connection strong enough for you to watch video on your device without major problems?: Yes     Do you have enough data to get through the month?: Yes     Does at least one of the devices have a camera that you can use for video chat?: Yes       Complex Discharge Information    Is patient identified as a difficult/complex discharge?: No       Discharge Needs Assessment  Concerns to be Addressed: discharge planning, care coordination/care conferences    Clinical Risk Factors: > 65, Lives Alone or Absence of Caregiver to Assist with Discharge and Home Care, Multiple Diagnoses (Chronic)    Barriers to taking medications: No    Prior overnight hospital stay or ED visit in last 90 days: No       Anticipated Changes Related to Illness: other (see comments) (Likely will need time to recover to resume prior level of functioning.)    Equipment Needed After Discharge: other (see comments) (CM will follow for DME needs.)    Discharge Facility/Level of Care Needs: other (see comments) (CM will follow for DC needs.)    Readmission  Risk of Unplanned Readmission Score: UNPLANNED READMISSION SCORE: 20.89%  Predictive Model Details          21% (Medium)  Factor Value    Calculated 07/31/2023 12:04 23% Number of active inpatient medication orders 36    [place] Risk of Unplanned Readmission Model 10% Diagnosis of cancer present     9% ECG/EKG order present in last 6 months     7% Latest BUN high (48 mg/dL)     7% Diagnosis of electrolyte disorder present  6% Imaging order present in last 6 months     6% Age 30     6% Latest hemoglobin low (12.5 g/dL)     6% Phosphorous result present     5% Charlson Comorbidity Index 5     5% Number of ED visits in last six months 1     4% Active anticoagulant inpatient medication order present     4% Latest creatinine high (2.13 mg/dL)     2% Future appointment scheduled     1% Current length of stay 0.507 days      Readmitted Within the Last 30 Days? (No if blank)   Patient at risk for readmission?: No    Discharge Plan  Screen findings are: Care Manager reviewed the plan of the patient's care with the Multidisciplinary Team. No discharge planning needs identified at this time. Care Manager will continue to manage plan and monitor patient's progress with the team.    Expected Discharge Date: 07/31/2023    Expected Transfer from Critical Care:      Quality data for continuing care services shared with patient and/or representative?: N/A  Patient and/or family were provided with choice of facilities / services that are available and appropriate to meet post hospital care needs?: Other (Comment) (CM will provide choice of facilities/services if deemed medically necessary.)       Initial Assessment complete?: Yes

## 2023-07-31 NOTE — Unmapped (Signed)
 Physician Discharge Summary [facility]  1 [facility] OBSERVATION [facility]  [address]  [place] [address]  Dept: [phone]  Loc: [phone]     Identifying Information:   Lee Baxter  1945/05/18  [id number]    Primary Care Physician: Lucy Sack, MD     Code Status: DNR and DNI    Admit Date: 07/30/2023    Discharge Date: 07/31/2023     Discharge To: Home    Discharge Service: [facility] - General Medicine Floor Team MED L - Tower     Discharge Attending Physician: Verlinda Gloss, MD    Discharge Diagnoses:   Principal Problem:    Benign prostatic hyperplasia with incomplete bladder emptying (POA: Unknown)  Active Problems:    Primary hypertension (POA: Yes)    Hyperlipidemia (POA: Yes)    AKI (acute kidney injury) (POA: Yes)    Asthma-COPD overlap syndrome   (POA: Yes)    Chewing tobacco use (POA: Yes)    GERD without esophagitis (POA: Yes)    History of lung cancer (POA: Not Applicable)    Diabetes mellitus type 2 without retinopathy   (POA: Yes)    Dry eye syndrome of bilateral lacrimal glands (POA: Yes)    Stage 3a chronic kidney disease (CMS-HCC) (POA: Yes)    Acute cystitis with hematuria (POA: Unknown)    Hyperkalemia (POA: Unknown)  Resolved Problems:    Constipation (POA: Unknown)      Hospital Course:   Lee Baxter is a 78 y.o. male with pertinent PMHx of asthma-COPD overlap, HTN, CKD 3a, T2DM, HLD, GERD, BPH, prior LUL adenocarcinoma (s/p partial lobectomy), and RLL NSCLC (s/p radiation c/b radiation pneumonitis) who presented to [facility] due to hyperkalemia and AKI identified on outpatient labs in the setting of 2 weeks of urinary frequency/hesitancy/incomplete voiding, mild dysuria, and mild suprapubic discomfort, also found to have Right renal mass.     Urinary retention (resolved) - BPH  Patient with known BPH (on tamsulosin and finasteride), presented with 2 weeks of urinary hesitancy/urgency/incomplete voiding, found to have PVR of >29mL after voiding in the ED, all of which was concerning for urinary retention. Additionally, reported having infrequent, hard, ball-like stools, so suspect constipation was playing a role. No hydronephrosis seen on CT, but given convincing history, foley was placed with resolution of obstruction. The day following admission, foley was removed and patient passed his trial of void and stated that he no longer had the sensation of incomplete emptying.     AKI on  CKD 3a (improving)  Creatinine on presentation elevated to 2.47 from baseline ~1.3 (though has fluctuated significantly up to ~1.6 in the past). Suspected to be post-renal in etiology due to bladder outlet obstruction as above, though patient presented with poor PO intake so received 1L NS on arrival. His creatinine steadily improved after fluids and placement of foley with relief of obstruction. On discharge, he was making good urine without retention, creatinine was downtrending, and his LUTS were significantly improving.     Complicated cystitis  Patient presenting with suprapubic pain and mild possible dysuria in setting of suspected retention, along with UA with pyuria/large leuk esterase, positive nitrites, consistent with cystitis, though no CVA tenderness, fever, or leukocytosis to suggest pyelonephritis. No reported or documented history of urinary tract infections in the past. Suspect bladder outlet obstruction was precipitant for retrograde bacterial invasion. He received one dose of ceftriaxone in ED, switched to cephalexin 500 mg BID to start on 5/20 for total 7  day course of antibiotics (including ceftriaxone). Received first day's doses inpatient and will complete course as outpatient (5 more days).      Hyperkalemia (improving)  K 7 on outpatient labs, improved to 6 on presentation to the ED, and 5.4 on recheck s/p insulin/glucose. Suspect due to AKI as above, as well as home lisinopril. No chest pain or palpitations. ECG with NSR without stigmata of hyperkalemia (baseline borderline prolonged PR interval), though slight diffuse notching of QRS complex noted- unclear significance. He additionally received a 10g dose of Lokelma with improvement of his potassium to 5.1 prior to discharge. Will follow up with PCP for lab recheck.    Right renal lesion  Patient with 1.1 cm exophytic left upper pole hyperattenuating lesion noted on CT A/P, with differential for radiology including proteinaceous cyst or RCC. Recommended non-urgent MRI abdomen/pelvis for better characterization. Patient also has reported ~3 months of weight loss (though on review of weight trend, appears he has ranged from 122-134 lbs over the past year, currently 123lbs).  Additionally, he has had increasing borderline hypercalcemia, which could go along with RCC. On discharge, advised PCP follow-up for further imaging/work-up. Ambulatory referral to urology also placed.     Constipation (resolved)  Patient presented with worsening constipation, which may have been contributing to his urinary symptoms as above. Received a SMOG enema with immediate resolution. Encouraged continued home Miralax use as directed by his PCP.    Advanced care planning  Confirmed with patient on this admission that he would not want resuscitation or intubation in the setting of cardiopulmonary arrest. His code status was changed to DNR/DNI.     Chronic Problems  HTN: home lisinopril held both during admission and on discharge in setting of AKI/hyperkalemia. Plan for PCP follow-up to discuss antihypertensive regimen.  COPD-Asthma: Continue home inhaler regimen.  T2DM: Metformin held on admission in setting of AKI. Resumed home regimen on discharge.  HLD: Continue home atorvastatin 40mg .  Dry eyes: Continue home Theratears TID.    The patient's hospital stay has been complicated by the following clinically significant conditions requiring additional evaluation and treatment or having a significant effect of this patient's care: - Chronic kidney disease POA requiring further investigation, treatment, or monitoring  - Hyperkalemia POA requiring further investigation, treatment, or monitoring     Outpatient Provider Follow Up Issues:   PCP  Follow-up on urine output and urinary symptoms  Repeat creatinine and electrolytes to ensure continue improvement  Discuss antihypertensive regimen (lisinopril held on discharge). We suspect that it led to his hyperkalemia   Follow-up MRI abdomen/pelvis for left upper pole renal lesion seen on CT scan  Urology  Follow-up on urinary retention symptoms/BPH  Discuss workup for renal mass as above    Touchbase with Outpatient Provider:  Warm Handoff: Not completed secondary to N/A    Procedures:  N/A  ______________________________________________________________________  Discharge Medications:      Your Medication List        ASK your doctor about these medications      albuterol 90 mcg/actuation inhaler  Commonly known as: PROVENTIL HFA;VENTOLIN HFA  TAKE 2 PUFFS BY MOUTH EVERY 6 HOURS AS NEEDED FOR WHEEZE     albuterol 2.5 mg /3 mL (0.083 %) nebulizer solution  Inhale 3 mL (2.5 mg total) by nebulization every four (4) hours as needed for wheezing or shortness of breath.     atorvastatin 40 MG tablet  Commonly known as: LIPITOR  TAKE 1 TABLET BY MOUTH EVERY DAY IN  THE EVENING     blood-glucose meter kit  Disp. blood glucose meter kit preferred by patient's insurance. Dx: Diabetes, E11.9     DUPIXENT PEN 300 mg/2 mL pen injector  Generic drug: dupilumab  Inject the contents of 1 pen (300 mg) under the skin every fourteen (14) days.     empty container Misc  Use as directed to dispose of Dupixent pens     EPINEPHrine 0.3 mg/0.3 mL injection  Commonly known as: EPIPEN  Inject 0.3 mL (0.3 mg total) into the muscle once as needed for anaphylaxis for up to 1 dose.     finasteride 5 mg tablet  Commonly known as: PROSCAR  Take 1 tablet (5 mg total) by mouth daily.     fluticasone propionate 50 mcg/actuation nasal spray  Commonly known as: FLONASE  2 sprays into each nostril daily.     ipratropium-albuterol 0.5-2.5 mg/3 mL nebulizer  Commonly known as: DUO-NEB  Inhale 1 vial ( 3 mL) by nebulization every six (6) hours. Use as directed     lisinopril 10 MG tablet  Commonly known as: PRINIVIL,ZESTRIL  Take 1 tablet (10 mg total) by mouth daily.     MEDICAL SUPPLY ITEM  Pt has DM hx of neuropathy w/ callus. Followed for comprehensive care. For med records, fax release to [phone].     MEDICAL SUPPLY ITEM  1 pair of diabetic shoes and 3 pairs of inserts.  Pt has DM with neuropathy w/ callus. Followed for comprehensive care. For med records, fax release to [phone]     metFORMIN 500 MG tablet  Commonly known as: GLUCOPHAGE  TAKE 1 TABLET (500MG  TOTAL) BY MOUTH IN THE MORNING AND TAKE 1 TABLET IN THE EVENING WITH MEALS     ofloxacin 0.3 % ophthalmic solution  Commonly known as: OCUFLOX  APPLY 2 DROPS (OPHTHALMIC (EYE)) 4 TIMES PER DAY FOR 5 DAYS     oxyCODONE-acetaminophen 5-325 mg per tablet  Commonly known as: PERCOCET  Take 1 tablet by mouth every four (4) hours as needed for pain.  Ask about: Should I take this medication?     oxyCODONE-acetaminophen 5-325 mg per tablet  Commonly known as: PERCOCET  Take 1 tablet by mouth every four (4) hours as needed for pain.  Ask about: Which instructions should I use?     oxyCODONE-acetaminophen 5-325 mg per tablet  Commonly known as: PERCOCET  Take 1 tablet by mouth every four (4) hours as needed for pain.  Start taking on: August 30, 2023  Ask about: Which instructions should I use?     oxyCODONE-acetaminophen 5-325 mg per tablet  Commonly known as: PERCOCET  Take 1 tablet by mouth every four (4) hours as needed for pain.  Start taking on: September 29, 2023  Ask about: Which instructions should I use?     sodium chloride 3 % NEBULIZER solution  Inhale 1 vial ( 4 mL) by nebulization two (2) times a day.     tamsulosin 0.4 mg capsule  Commonly known as: FLOMAX  Take 1 capsule (0.4 mg total) by mouth two (2) times a day.     TRELEGY ELLIPTA 200-62.5-25 mcg inhaler  Generic drug: fluticasone-umeclidin-vilanter  INHALE 1 PUFF IN THE MORNING              Allergies:  Latex  ______________________________________________________________________  Pending Test Results:  Pending Labs       Order Current Status    Blood Culture #1 In process    Blood  Culture #2 In process    Urine Culture In process            Most Recent Labs:  All lab results last 24 hours -   Recent Results (from the past 24 hours)   ECG 12 Lead    Collection Time: 07/30/23  6:16 PM   Result Value Ref Range    EKG Systolic BP  mmHg    EKG Diastolic BP  mmHg    EKG Ventricular Rate 93 BPM    EKG Atrial Rate 93 BPM    EKG P-R Interval 192 ms    EKG QRS Duration 82 ms    EKG Q-T Interval 320 ms    EKG QTC Calculation 397 ms    EKG Calculated P Axis 74 degrees    EKG Calculated R Axis 64 degrees    EKG Calculated T Axis 73 degrees    QTC Fredericia 370 ms   Basic metabolic panel    Collection Time: 07/30/23  6:34 PM   Result Value Ref Range    Sodium 140 135 - 145 mmol/L    Potassium 6.0 (H) 3.4 - 4.8 mmol/L    Chloride 110 (H) 98 - 107 mmol/L    CO2 23.0 20.0 - 31.0 mmol/L    Anion Gap 7 5 - 14 mmol/L    BUN 47 (H) 9 - 23 mg/dL    Creatinine 0.98 (H) 0.73 - 1.18 mg/dL    BUN/Creatinine Ratio 19     eGFR CKD-EPI (2021) Male 26 (L) >=60 mL/min/1.23m2    Glucose 246 (H) 70 - 179 mg/dL    Calcium 11.9 8.7 - 14.7 mg/dL   CBC w/ Differential    Collection Time: 07/30/23  6:34 PM   Result Value Ref Range    WBC 9.9 3.6 - 11.2 10*9/L    RBC 4.14 (L) 4.26 - 5.60 10*12/L    HGB 13.1 12.9 - 16.5 g/dL    HCT 82.9 56.2 - 13.0 %    MCV 94.5 77.6 - 95.7 fL    MCH 31.6 25.9 - 32.4 pg    MCHC 33.5 32.0 - 36.0 g/dL    RDW 86.5 78.4 - 69.6 %    MPV 8.1 6.8 - 10.7 fL    Platelet 296 150 - 450 10*9/L    Neutrophils % 72.0 %    Lymphocytes % 13.6 %    Monocytes % 9.5 %    Eosinophils % 4.4 %    Basophils % 0.5 %    Absolute Neutrophils 7.2 1.8 - 7.8 10*9/L    Absolute Lymphocytes 1.4 1.1 - 3.6 10*9/L    Absolute Monocytes 0.9 (H) 0.3 - 0.8 10*9/L    Absolute Eosinophils 0.4 0.0 - 0.5 10*9/L    Absolute Basophils 0.1 0.0 - 0.1 10*9/L   Magnesium    Collection Time: 07/30/23  6:34 PM   Result Value Ref Range    Magnesium 2.0 1.6 - 2.6 mg/dL   Phosphorus    Collection Time: 07/30/23  6:34 PM   Result Value Ref Range    Phosphorus 3.4 2.4 - 5.1 mg/dL   Uric acid    Collection Time: 07/30/23  6:34 PM   Result Value Ref Range    Uric Acid 9.3 (H) 3.7 - 9.2 mg/dL   POCT Glucose    Collection Time: 07/30/23  6:45 PM   Result Value Ref Range    Glucose, POC 236 (H) 70 - 179 mg/dL   LDH, Lactate  dehydrogenase    Collection Time: 07/30/23  7:11 PM   Result Value Ref Range    LDH 149 120 - 246 U/L   Blood Gas, Venous    Collection Time: 07/30/23  7:11 PM   Result Value Ref Range    Specimen Source Venous     FIO2 Venous Not Specified     pH, Venous 7.37 7.32 - 7.43    pCO2, Ven 41 40 - 60 mm Hg    pO2, Ven 35 30 - 55 mm Hg    HCO3, Ven 24 22 - 27 mmol/L    Base Excess, Ven -1.5 -2.0 - 2.0    O2 Saturation, Venous 63.5 40.0 - 85.0 %   POCT Glucose    Collection Time: 07/30/23  7:12 PM   Result Value Ref Range    Glucose, POC 201 (H) 70 - 179 mg/dL   Urinalysis with Microscopy with Culture Reflex    Collection Time: 07/30/23  8:41 PM   Result Value Ref Range    Color, UA Light Yellow     Clarity, UA Turbid     Specific Gravity, UA 1.014 1.003 - 1.030    pH, UA 5.5 5.0 - 9.0    Leukocyte Esterase, UA Large (A) Negative    Nitrite, UA Negative Negative    Protein, UA Trace (A) Negative    Glucose, UA >1000 mg/dL (A) Negative    Ketones, UA Negative Negative    Urobilinogen, UA <2.0 mg/dL <9.6 mg/dL    Bilirubin, UA Negative Negative    Blood, UA Negative Negative    RBC, UA 16 (H) <=3 /HPF    WBC, UA >182 (H) <=2 /HPF    Squam Epithel, UA <1 0 - 5 /HPF    Bacteria, UA None Seen None Seen /HPF    WBC Clumps Rare (A) None Seen /HPF    Renal Epithel, UA <1 (H) <=0 /HPF    Mucus, UA Rare (A) None Seen /HPF   POCT Glucose Collection Time: 07/30/23  9:37 PM   Result Value Ref Range    Glucose, POC 112 70 - 179 mg/dL   Basic metabolic panel    Collection Time: 07/30/23  9:41 PM   Result Value Ref Range    Sodium 141 135 - 145 mmol/L    Potassium 5.4 (H) 3.4 - 4.8 mmol/L    Chloride 110 (H) 98 - 107 mmol/L    CO2 22.0 20.0 - 31.0 mmol/L    Anion Gap 9 5 - 14 mmol/L    BUN 48 (H) 9 - 23 mg/dL    Creatinine 2.95 (H) 0.73 - 1.18 mg/dL    BUN/Creatinine Ratio 21     eGFR CKD-EPI (2021) Male 28 (L) >=60 mL/min/1.57m2    Glucose 109 70 - 179 mg/dL    Calcium 28.4 8.7 - 13.2 mg/dL   POCT Glucose    Collection Time: 07/30/23 10:38 PM   Result Value Ref Range    Glucose, POC 164 70 - 179 mg/dL   ECG 12 Lead    Collection Time: 07/30/23 10:59 PM   Result Value Ref Range    EKG Systolic BP  mmHg    EKG Diastolic BP  mmHg    EKG Ventricular Rate 82 BPM    EKG Atrial Rate 82 BPM    EKG P-R Interval 202 ms    EKG QRS Duration 78 ms    EKG Q-T Interval 334 ms    EKG QTC Calculation  390 ms    EKG Calculated P Axis 81 degrees    EKG Calculated R Axis 53 degrees    EKG Calculated T Axis 62 degrees    QTC Fredericia 370 ms   Creatinine, Urine    Collection Time: 07/31/23  2:10 AM   Result Value Ref Range    Creat U 79.0 Undefined mg/dL   Sodium, Random Urine    Collection Time: 07/31/23  2:10 AM   Result Value Ref Range    Sodium, Ur 105 Undefined mmol/L   POCT Glucose    Collection Time: 07/31/23  2:53 AM   Result Value Ref Range    Glucose, POC 201 (H) 70 - 179 mg/dL   Basic Metabolic Panel    Collection Time: 07/31/23  5:01 AM   Result Value Ref Range    Sodium 142 135 - 145 mmol/L    Potassium 5.8 (H) 3.4 - 4.8 mmol/L    Chloride 110 (H) 98 - 107 mmol/L    CO2 23.0 20.0 - 31.0 mmol/L    Anion Gap 9 5 - 14 mmol/L    BUN 48 (H) 9 - 23 mg/dL    Creatinine 1.61 (H) 0.73 - 1.18 mg/dL    BUN/Creatinine Ratio 23     eGFR CKD-EPI (2021) Male 31 (L) >=60 mL/min/1.57m2    Glucose 179 70 - 179 mg/dL    Calcium 9.6 8.7 - 09.6 mg/dL   Magnesium Level Collection Time: 07/31/23  5:01 AM   Result Value Ref Range    Magnesium 1.9 1.6 - 2.6 mg/dL   POCT Glucose    Collection Time: 07/31/23  6:40 AM   Result Value Ref Range    Glucose, POC 173 70 - 179 mg/dL   POCT Glucose    Collection Time: 07/31/23  7:29 AM   Result Value Ref Range    Glucose, POC 147 70 - 179 mg/dL   CBC w/ Differential    Collection Time: 07/31/23 10:53 AM   Result Value Ref Range    WBC 9.4 3.6 - 11.2 10*9/L    RBC 3.94 (L) 4.26 - 5.60 10*12/L    HGB 12.5 (L) 12.9 - 16.5 g/dL    HCT 04.5 (L) 40.9 - 48.0 %    MCV 93.2 77.6 - 95.7 fL    MCH 31.7 25.9 - 32.4 pg    MCHC 34.1 32.0 - 36.0 g/dL    RDW 81.1 91.4 - 78.2 %    MPV 8.8 6.8 - 10.7 fL    Platelet 261 150 - 450 10*9/L    Neutrophils % 71.0 %    Lymphocytes % 13.0 %    Monocytes % 10.0 %    Eosinophils % 5.0 %    Basophils % 1.0 %    Absolute Neutrophils 6.6 1.8 - 7.8 10*9/L    Absolute Lymphocytes 1.2 1.1 - 3.6 10*9/L    Absolute Monocytes 0.9 (H) 0.3 - 0.8 10*9/L    Absolute Eosinophils 0.5 0.0 - 0.5 10*9/L    Absolute Basophils 0.1 0.0 - 0.1 10*9/L   POCT Glucose    Collection Time: 07/31/23 11:15 AM   Result Value Ref Range    Glucose, POC 164 70 - 179 mg/dL   Basic Metabolic Panel    Collection Time: 07/31/23  1:42 PM   Result Value Ref Range    Sodium 136 135 - 145 mmol/L    Potassium 5.1 (H) 3.4 - 4.8 mmol/L    Chloride 110 (H) 98 -  107 mmol/L    CO2 24.0 20.0 - 31.0 mmol/L    Anion Gap 2 (L) 5 - 14 mmol/L    BUN 42 (H) 9 - 23 mg/dL    Creatinine 1.61 (H) 0.73 - 1.18 mg/dL    BUN/Creatinine Ratio 22     eGFR CKD-EPI (2021) Male 35 (L) >=60 mL/min/1.93m2    Glucose 149 70 - 179 mg/dL    Calcium 9.6 8.7 - 09.6 mg/dL   POCT Glucose    Collection Time: 07/31/23  4:14 PM   Result Value Ref Range    Glucose, POC 117 70 - 179 mg/dL       Relevant Studies/Radiology:  ECG 12 Lead  Result Date: 07/31/2023  NORMAL SINUS RHYTHM NORMAL ECG WHEN COMPARED WITH ECG OF 30-Jul-2023 18:16, NO SIGNIFICANT CHANGE WAS FOUND Confirmed by Karlis Overland ([id number]) on 07/31/2023 2:20:58 PM    ECG 12 Lead  Result Date: 07/31/2023  NORMAL SINUS RHYTHM NORMAL ECG WHEN COMPARED WITH ECG OF 25-Jun-2022 12:03, PREMATURE VENTRICULAR BEATS ARE NO LONGER PRESENT QUESTIONABLE CHANGE IN QRS AXIS Confirmed by Karlis Overland ([id number]) on 07/31/2023 2:14:37 PM    PVL Venous Duplex Lower Extremity Right  Result Date: 07/31/2023   Peripheral Vascular Lab     [address]   [place], [place] [address]  PVL VENOUS DUPLEX LOWER EXTREMITY RIGHT Patient Demographics Pt. Name: Lee Baxter Location: [facility] Inpatient Lab MRN:      [id number]       Sex:      M DOB:      02-18-1946    Age:      89 years  Study Information Authorizing         [phone] [place] [employer]ings S         Performed Time       07/31/2023 Provider Name       [facility]                                   8:34:39 AM Ordering Physician  Arvil Lauber       Patient Location     [facility] Accession Number    [id number] UN        Technologist         Norm Becker                                                                RVT Diagnosis:                                Assisting            Max Felipe Horton,                                           Technologist         Student Ordered Reason For Exam: Asymmetric R > L LE swelling Indication: Swelling Risk Factors: Cancer (NSCLC). Anticoagulation: (Heparin). Protocol The major deep veins from the inguinal ligament to the ankle are assessed for compressibility and color  and spectral Doppler flow characteristics on the requested limb. The assessed veins include common femoral vein, femoral vein in the thigh, popliteal vein, and intramuscular calf veins. The iliac vein is assessed indirectly using Doppler waveform analysis. The great saphenous vein is assessed for compressibility at the saphenofemoral junction, and the small saphenous vein assessed for compressibility behind the knee. A contralateral PW Doppler waveform is obtained for comparison.  Final Interpretation Right Abnormalities consistent with the sequela of a prior venous obstructive process, with findings that appear chronic/long standing in nature are identified in the calf veins. Left There is no evidence of obstruction proximal to the inguinal ligament or in the common femoral vein.  Electronically signed by [id number] Kerney Pee MD on 07/31/2023 at 10:22:36 AM.  -------------------------------------------------------------------------------- Right Duplex Findings The right peroneal vein (1of2) and posterior tibial vein (1of3) is partially compressible and appears partially recanalized, brightly echogenic and rigid with compression. All other veins visualized appear fully compressible and demonstrate appropriate Doppler characteristics.  Left Duplex Findings CFV Doppler waveform appears appropriately phasic. Right Technical Summary Evidence of chronic obstruction in the right peroneal vein (1of2) and posterior tibial vein (1of3). All other veins visualized appear fully compressible and demonstrate appropriate Doppler characteristics. Left Technical Summary No evidence of iliofemoral obstruction.  Final     CT Abdomen Pelvis Wo Contrast  Result Date: 07/30/2023  EXAM: CT ABDOMEN PELVIS WO CONTRAST ACCESSION: [id number] UN REPORT DATE: 07/30/2023 8:47 PM CLINICAL INDICATION: 78 years old with 75 - year - old male presenting with 2 weeks of UTI with acute on chronic kidney injury.      COMPARISON: Same-day abdominal radiograph, CT abdomen pelvis 10/12/2021     TECHNIQUE: A spiral CT scan was obtained without IV contrast from the lung bases to the pubic symphysis.  Images were reconstructed in the axial plane. Coronal and sagittal reformatted images were also provided for further evaluation.     Evaluation of the solid organs and vasculature is limited in the absence of intravenous contrast.         FINDINGS:     LOWER CHEST: Post radiation changes of the right lower lobe. Emphysema at the lung bases; coronary artery calcification.     LIVER: Unremarkable noncontrast appearance. BILIARY: The gallbladder is surgically absent. No intrahepatic biliary ductal dilatation.     SPLEEN: Normal in size and contour.     PANCREAS: Normal pancreatic contour without signs of inflammation or gross ductal dilatation.     ADRENAL GLANDS: Normal appearance of the adrenal glands.     KIDNEYS/URETERS: Right kidney nonobstructing nephrolithiasis. No ureteral dilatation or collecting system distention. Nonspecific perinephric stranding. Probable bilateral cysts cyst. There is a new exophytic left upper pole hyperattenuating lesion measuring 1.1 cm (2:36).     BLADDER: Unremarkable.     REPRODUCTIVE ORGANS: Prostatic calcifications. The prostate gland is enlarged.     GI TRACT: The stomach appears unremarkable. The loops of small bowel are normal in caliber. No evidence of acute colonic pathology. The appendix appears unremarkable.     PERITONEUM/RETROPERITONEUM AND MESENTERY: No free air. No ascites. No fluid collection.     VASCULATURE: Severe calcified atherosclerotic disease.     LYMPH NODES: No adenopathy.     BONES and SOFT TISSUES: Degenerative changes of the spine. Postsurgical change of the intra-abdominal wall. Dependent edema.         No evidence of acute intra-abdominal/pelvic pathology.     New exophytic left upper pole hyperattenuating lesion measuring 1.1 cm which could represent  the proteinaceous cyst or renal cell carcinoma. Further evaluation with nonemergent/MRI is recommended.         FOLLOW-UP RECOMMENDATION:     Item for Follow Up: 1. Acuity: Non-urgent 2. Modality: MR 3. Anatomy: Abdomen 4. TimeFrame: 3 Months         XR Chest 2 views  Result Date: 07/30/2023  EXAM: XR CHEST 2 VIEWS ACCESSION: [id number] UN REPORT DATE: 07/30/2023 3:12 PM     CLINICAL INDICATION: [place] ; CHRONIC AIRWAY OBSTRUCTION  - J44.89 - Asthma - COPD overlap syndrome      TECHNIQUE: PA and Lateral Chest Radiographs     COMPARISON: XR CHEST 2 VIEWS 06/09/2022     FINDINGS: Minimal right basilar scarring. Elevated left hemidiaphragm with adjacent opacities, likely atelectasis. No definite new focal consolidation or pulmonary edema. No pleural effusion or pneumothorax. Metallic fragments in the right lower lobe.     Cardiac silhouette is normal in size. Thoracic aorta with calcifications. Cervical spine fusion hardware. Nonunited chronic left sixth rib fracture.         No evidence of pneumonia or pulmonary edema.                 XR Abdomen 1 View  Result Date: 07/30/2023  EXAM: XR ABDOMEN 1 VIEW ACCESSION: [id number] UN REPORT DATE: 07/30/2023 1:16 PM     CLINICAL INDICATION: 78 years old with ABDOMINAL PAIN   -  LEFT LOWER QUADRANT  - K59.00 - Constipation, unspecified constipation type      COMPARISON: 06/26/2022, and earlier.     TECHNIQUE: Supine view of the abdomen, 2 image(s)     FINDINGS: Please see separately dictated report of same day chest radiograph for further discussion regarding any additional findings above the level of the diaphragm.     The visualized bowel gas pattern is nonspecific. Multiple mildly dilated air-filled loops of small bowel are noted projecting over the left hemiabdomen, measuring up to 3.2 cm in diameter. Distally some air and stool can be seen within the colon to the level of the splenic flexure. Multilevel degenerative changes are noted throughout the spine. Mild to moderate volume colonic stool burden. Bilateral iliac crest enthesopathy and hip osteoarthrosis.         Mildly dilated loops of air-filled small bowel are noted projecting over the left hemiabdomen, measuring up to 3.2 cm in diameter. This is a nonspecific finding as some air and stool can be seen within the colon to the level of the splenic flexure. Differential considerations might include developing ileus versus early obstruction. Clinical correlation is recommended.     Mild to moderate volume colonic stool burden.         ______________________________________________________________________  Discharge Instructions:   Activity Instructions       Activity as tolerated                       Follow Up instructions and Outpatient Referrals     Call MD for:  difficulty breathing, headache or visual disturbances      Call MD for:  persistent nausea or vomiting      Call MD for:  severe uncontrolled pain      Call MD for:  temperature >38.5 Celsius      Discharge instructions          Appointments which have been scheduled for you      Aug 08, 2023 3:00 PM  (Arrive by 2:45 PM)  RETURN CARE TRANSITIONS [place] with  MEDMED HOSPITAL FOLLOW-UP  [facility] SAME DAY CLINIC [place] [facility] REGION) [address]  [facility] 1 through 4  [place] [address]  [phone]        Aug 31, 2023 9:00 AM  (Arrive by 8:45 AM)  CT CHEST WO CONTRAST with IC CT RM 2  RAD [facility] [facility]) [address]  1st Floor  [place] [address]  [phone]   Let us  know if pt:  Pregnant or nursing  Claustrophobic    (Title:[id number])         Aug 31, 2023 10:30 AM  (Arrive by 10:00 AM)  FOLLOW UP 15 with Fernanda Howell, MD  [facility] RADIATION ONCOLOGY [place] [facility] REGION) [address]  [place] [address]  [phone]        Nov 02, 2023 8:30 AM  (Arrive by 8:15 AM)  RETURN CONTINUITY with Lucy Sack, MD  [facility] INTERNAL MEDICINE [place] [facility] REGION) [address]  [facility] 1 through 4  [place] [address]  [phone]        Nov 21, 2023 10:00 AM  (Arrive by 9:45 AM)  RETURN COPD with Shellia Dial, MD  [facility] PULMONARY SPECIALTY CL [place]  [facility] REGION) [address]  [facility] 1 through 4  [place] [address]  [phone]             ______________________________________________________________________  Discharge Day Services:  BP 153/82  - Pulse 74  - Temp 36.7 ??C (98.1 ??F) (Oral)  - Resp 16  - Ht 165.1 cm (5' 5)  - Wt 56.7 kg (125 lb)  - SpO2 98%  - BMI 20.80 kg/m??     Pt seen on the day of discharge and determined appropriate for discharge.    Condition at Discharge: stable    Length of Discharge: I spent greater than 30 mins in the discharge of this patient.

## 2023-07-31 NOTE — Unmapped (Deleted)
 Internal Medicine (MEDL) Progress Note    Assessment & Plan:   Lee Baxter is a 78 y.o. male whose presentation is complicated by asthma-COPD overlap, HTN, CKD 3a, T2DM, HLD, GERD, BPH, prior LUL adenocarcinoma (s/p partial lobectomy), and RLL NSCLC (s/p radiation c/b radiation pneumonitis) that presented to [facility] with hyperkalemia and AKI identified on outpatient labs in the setting of 2 weeks of  urinary frequency/hesitancy/incomplete voiding, mild dysuria, and mild suprapubic discomfort, also found to have R renal mass.     Principal Problem:    Hyperkalemia  Active Problems:    Primary hypertension    Hyperlipidemia    AKI (acute kidney injury)    Asthma-COPD overlap syndrome      Chewing tobacco use    GERD without esophagitis    History of lung cancer    Diabetes mellitus type 2 without retinopathy      Dry eye syndrome of bilateral lacrimal glands    Stage 3a chronic kidney disease (CMS-HCC)    Acute cystitis with hematuria      Active Problems  Urinary retention - BPH   Patient with known BPH (on tamsulosin and finasteride), presented with 2 weeks of urinary hesitancy/urgency/incomplete voiding, found to have PVR of >2109mL after voiding in the ED, all of which is concerning for urinary retention. Additionally, reports having infrequent, hard, ball-like stools, so suspect constipation is playing a role. Interestingly, no hydronephrosis on CT, but given convincing history, Foley was placed overnight with resolution of obstruction.   - Trial of void today  - Consider urology consult if failed TOV  - Home finasteride 5mg  daily  - Home tamsulosin 0.4mg  nightly    AKI on  CKD 3a (improving)  Creatinine on presentation elevated to 2.47 from baseline ~1.3 (though has fluctuated significantly up to ~1.6 in the past). Suspect post-renal in etiology, though patient also endorses poor appetite for the past 2 weeks, so pre-renal etiology could be playing a role though not grossly volume down on exam. Patient s/p 1L NS on arrival. Creatinine improving after fluids and placement of foley. Repeat Cr 1.9.    - s/p 1L NS  - TOV today    Complicated Cystitis  Patient presenting with suprapubic pain and mild possible dysuria in setting of suspected retention, along with UA with pyuria/large leuk esterase, positive nitrites is consistent with cystitis, though no CVA tenderness, fever, or leukocytosis to suggest pyelonephritis. No reported or documented history of urinary tract infections in the past. S/p one dose ceftriaxone in ED, switched to cephalexin to start on 5/20.  - Continue cephalexin 500mg  q12h for 7 day course (5/20- )    Hyperkalemia (improving)   K 7 on outpatient labs, improved to 6 on presentation to the ED, and 5.4 on recheck s/p insulin/glucose. Suspect due to AKI as above, as well as home lisinopril. No chest pain or palpitations. ECG with NSR without stigmata of hyperkalemia (baseline borderline prolonged PR interval), though slight diffuse notching of QRS complex noted which does not appear to been present on prior ECGs. Potassium still elevated at 5.8 this AM. Repeat K 5.1  - s/p CaGluconate 2g, regular insulin 5u with d50, 10g Lokelma  - Consider changing his lisinopril   - BMP in PM  - Telemetry     Right renal lesion  Patient with 1.1 cm exophytic left upper pole hyperattenuating lesion noted on CT A/P, with differential for radiology including proteinaceous cyst or RCC.  Patient has reported ~3 months of  weight loss (though on review of weight trend, appears he has ranged from 122-134 lbs over the past year, currently 123lbs).  Additionally, he has had increasing borderline hypercalcemia, which could go along with RCC. UA with 16 RBC, though also in the setting of possible cystitis as above.  - Plan for PCP follow-up vs ambulatory urology referral for further workup     Constipation (resolved)  - S/p SMOG enema with significant BM  - Miralax daily     Advanced care planning  Confirmed with patient on admission that he would not want resuscitation or intubation in the setting of cardiopulmonary arrest.  - DNR/DNI     Chronic Problems  HTN: hold home lisinopril iso AKI/hyperkalemia  COPD-Asthma: Breo Ellipta and Incruse Ellipta for home Trelegy, HTS 3% nebs BID, DuoNebs PRN; hold home dupilumab (q2weeks)  T2DM: SSI, hold home metformin iso AKI  HLD: home atorvastatin 40mg   Dry eyes: Theratears TID      The patient's presentation is complicated by the following clinically significant conditions requiring additional evaluation and treatment: - Chronic kidney disease POA requiring further investigation, treatment, or monitoring   - Disorders of electrolytes, volume status, and acid/base status: - Hyperkalemia POA requiring further investigation, treatment, or monitoring    Issues Impacting Complexity of Management:  -The patient is at high risk of complications from urinary tract infection      Medical Decision Making: Reviewed records from primary care provider.      Daily Checklist:  Diet: Regular Diet  DVT PPx: Heparin 5000units q8h  Electrolytes: Replete Potassium to >/= 3.6 and Magnesium to >/= 1.8  Code Status: DNR and DNI  Dispo: Continue obs care    Team Contact Information:   Primary Team: Internal Medicine (MEDL)  Primary Resident: Lee Barker, MD  Resident's Pager: [phone] (Gen MedL Intern - Tower)    Interval History:   No acute events overnight. Reports feeling overall better since placement of the foley. Denies any abdominal pain, fevers, chills. He would like to go home if possible and is willing to trial voiding. Denies any history of prior UTIs.  Patient states that his daughter primarily takes care of him and organizes medication.  He defers to his daughter for further questions about his medications.     All other systems were reviewed and are negative except as noted in the HPI    Objective:   Temp:  [36.4 ??C (97.5 ??F)-36.8 ??C (98.2 ??F)] 36.7 ??C (98.1 ??F)  Pulse:  [72-115] 74  SpO2 Pulse: [68-121] 68  Resp:  [16-20] 16  BP: (93-153)/(55-96) 153/82  SpO2:  [94 %-100 %] 98 %    Gen: NAD, converses   HENT: atraumatic, normocephalic  Heart: RRR, no m/r/g  Lungs: CTAB, no crackles or wheezes  Abdomen: soft, mild suprapubic TTP  Extremities: No edema    [employer] copy, MS4    I attest that I have reviewed the student note and that the components of the history of the present illness, the physical exam, and the assessment and plan documented were performed by me or were performed in my presence by the student where I verified the documentation and performed (or re-performed) the exam and medical decision making.    Aylyn Wenzler, MD PGY-1

## 2023-08-01 DIAGNOSIS — Z09 Encounter for follow-up examination after completed treatment for conditions other than malignant neoplasm: Principal | ICD-10-CM

## 2023-08-01 NOTE — Unmapped (Signed)
 I called Lee Baxter who is doing well after being discharged yesterday from [facility].  He is taking his cephalexin for UTI.  He was able to eat spaghetti today and kept it down now that his constipation was treated at the hospital.  He is also not taking the lisinopril as instructed (due to hyperkalemia and AKI on CKD) and will review his BP readings and address any needed change in BP medication at the next visit.  Asked him to keep drinking more water, check his blood pressure readings and if they are more than 140/90 (will be more liberal in this post hospitalization period), to call us .  Reminded him to follow-up at the office on 08/08/2023 for posthospitalization follow-up.  Also reminded him that he has an appointment with a urologist on 08/13/2023 to address the new left kidney mass.

## 2023-08-02 NOTE — Unmapped (Signed)
 Copied from CRM #[id number]. Topic: Access To Clinicians - Medication Refill  >> Aug 02, 2023  9:36 AM Zakiya C wrote:  The caller reports that they have previously requested refill from pharmacy, but have not received authorization yet. Patient was prescribed a blood glucose monitor but did not receive the accompanying supplies.     The patient  is requesting the following:     Medication(s) for refill: Lancets  & Test strips    Quantity for 3 month supply    Pharmacy name and address: [id number] - [place], [place] - [address] AT [facility]  [address] [place] [address]  Phone: [phone]  Fax: [phone]        Please contact The patient by Cell Phone or MyChart in regards to this request.    Coverage: yes, coverage is accurate on file.    Medication request callback turnaround time: 72 business hours. Programmer, systems Notified)

## 2023-08-02 NOTE — Unmapped (Signed)
 Left a message for pt to return call

## 2023-08-03 DIAGNOSIS — E1142 Type 2 diabetes mellitus with diabetic polyneuropathy: Principal | ICD-10-CM

## 2023-08-03 MED ORDER — BLOOD-GLUCOSE METER KIT WRAPPER
ORAL | 11 refills | 0.00000 days | Status: CP
Start: 2023-08-03 — End: 2024-08-02

## 2023-08-03 NOTE — Unmapped (Signed)
 Re-sent to pharmacy.

## 2023-08-03 NOTE — Unmapped (Signed)
 Copied from CRM [id number]. Topic: Access To Clinicians - Medication Question  >> Aug 03, 2023 10:26 AM Lisette Ridgel wrote:  Medication Question/ Issue from Patient    Name of medication:   blood-glucose meter kit     Caller described issue: patient was told by pharmacy that order didn't come in.    Caller desired outcome (eg, re-send to pharmacy): resend.    Best callback number if any questions (defaults to patient's preferred phone - confirm or change): [phone]  PCP: Lavon Pound ANTONIO  Last encounter in department: Visit date not found  (If more than a year, offer an appointment.)    [place]cano  08/03/23

## 2023-08-07 NOTE — Unmapped (Signed)
 HOSPITAL FOLLOW UP SOCIAL WORK ASSESSMENT:     Licensed Clinical Social Worker (SW) met face to face with patient during their hospital follow-up appointment on 08/08/2023 to assess for care management needs.    Is there someone else in the room? Yes. What is your relationship? Dtr who lives next door . Do you want this person here for the visit? Yes .    Hospital Course- Lee Baxter is a 78 y.o. male with pertinent PMHx of asthma-COPD overlap, HTN, CKD 3a, T2DM, HLD, GERD, BPH, prior LUL adenocarcinoma (s/p partial lobectomy), and RLL NSCLC (s/p radiation c/b radiation pneumonitis) who presented to [facility] due to hyperkalemia and AKI identified on outpatient labs in the setting of 2 weeks of urinary frequency/hesitancy/incomplete voiding, mild dysuria, and mild suprapubic discomfort, also found to have Right renal mas       Is patient able to participate in visit assessment? Yes       How is patients Mobility? Pt is independent with his ADLS   Are they independent with ADLS?  Yes , dtr helps with IADLS       Home Health [facility]) Care :   does not have home health services  Briefly discussed future needs pt has Medicaid that will provide an aide and dtr voiced appreciation of info     Durable Medical Equipment (DME):   Patient denies use of dme .  pt does have a talking BP Monitor    DME Agency:  Denies need for repair on existing equipment    reports need for additional equipment at this time.   New order needed?: yes  If yes, order needed ZOX:WRUE  No preference with agency, will ask Provider to order  As pt lives in [facility] , will need to use outside agency, likely [employer] [facility]         Personal Care Service/Personal Aide (PCS):   does not have personal care services/personal aide support and denies need for services       Specialist Follow-up:  Future Appointments   Date Time Provider Department Center   08/13/2023  8:00 AM Raynor, Randal Bury, MD [facility] [place] [facility]   08/31/2023  9:00 AM IC CT RM 2 [facility] [place] - IC   08/31/2023 10:30 AM Fernanda Howell, MD [facility]   11/02/2023  8:30 AM Lucy Sack, MD [facility]   11/21/2023 10:00 AM Shellia Dial, MD [facility]         Housing and Aftercare Support:    Patient lives alone in a House in [place].   He denies issues with home safety.    The patient receives support from his daughter.       Screening complete, no depression identified / no further action needed today       Social Determinants of Health Screening: dtr and pt deny any SDOH needs at this time. Pt still drives but limits himself. Dtr runs errands for him, picks up groceries for him      Social Drivers of Health     Food Insecurity: No Food Insecurity (07/31/2023)    Hunger Vital Sign     Worried About Running Out of Food in the Last Year: Never true     Ran Out of Food in the Last Year: Never true   Tobacco Use: High Risk (07/31/2023)    Patient History     Smoking Tobacco Use: Former     Smokeless Tobacco  Use: Current     Passive Exposure: Not on file   Transportation Needs: No Transportation Needs (07/31/2023)    PRAPARE - Transportation     Lack of Transportation (Medical): No     Lack of Transportation (Non-Medical): No   Alcohol Use: Not At Risk (07/30/2023)    Alcohol Use     How often do you have a drink containing alcohol?: Never     How many drinks containing alcohol do you have on a typical day when you are drinking?: 1 - 2     How often do you have 5 or more drinks on one occasion?: Never   Housing: Low Risk  (07/31/2023)    Housing     Within the past 12 months, have you ever stayed: outside, in a car, in a tent, in an overnight shelter, or temporarily in someone else's home (i.e. couch-surfing)?: No     Are you worried about losing your housing?: No   Physical Activity: Not on file   Utilities: Low Risk  (07/31/2023)    Utilities     Within the past 12 months, have you been unable to get utilities (heat, electricity) when it was really needed?: No   Stress: Not on file   Interpersonal Safety: Not At Risk (07/30/2023)    Interpersonal Safety     Unsafe Where You Currently Live: No     Physically Hurt by Anyone: No     Abused by Anyone: No   Substance Use: Low Risk  (07/30/2023)    Substance Use     In the past year, how often have you used prescription drugs for non-medical reasons?: Never     In the past year, how often have you used illegal drugs?: Never     In the past year, have you used any substance for non-medical reasons?: No   Intimate Partner Violence: Not At Risk (07/30/2023)    Humiliation, Afraid, Rape, and Kick questionnaire     Fear of Current or Ex-Partner: No     Emotionally Abused: No     Physically Abused: No     Sexually Abused: No   Social Connections: Not on file   Financial Resource Strain: Low Risk  (07/31/2023)    Overall Financial Resource Strain (CARDIA)     Difficulty of Paying Living Expenses: Not hard at all   Health Literacy: Low Risk  (04/30/2023)    Health Literacy     : Never   Internet Connectivity: No Internet connectivity concern identified (04/30/2023)    Internet Connectivity     Do you have access to internet services: Yes     How do you connect to the internet: Personal Device at home     Is your internet connection strong enough for you to watch video on your device without major problems?: Yes     Do you have enough data to get through the month?: Yes     Does at least one of the devices have a camera that you can use for video chat?: Yes       Upcoming appointments:  Future Appointments   Date Time Provider Department Center   08/08/2023  3:00 PM [facility] HOSPITAL FOLLOW-UP [facility]   08/13/2023  8:00 AM Raynor, Randal Bury, MD [facility]   08/31/2023  9:00 AM IC CT RM 2 [facility] [place] - IC   08/31/2023 10:30 AM Fernanda Howell, MD [facility]   11/02/2023  8:30 AM  Lucy Sack, MD [facility]   11/21/2023 10:00 AM Shellia Dial, MD [facility]       Reviewed upcoming clinic appointment(s): Yes.  Plans to keep appointment(s): Yes.  Transportation assistance needed: No    ------------------------------------------------------------------------------------------------------------------------------------------   Visit discussed with Ann Keto, PharmD, CPP and Escher Reva Castleman , MD who will also see patient during their HFU visit.      Recommendations and SDOH related problems were discussed directly with the patient's transitions of care physician, Dr. Reva Castleman, immediately following the Social Work Care Manager visit prior to the physician visit. Questions/concerns were addressed to the patient's satisfaction. I spent a total of 15 minutes with the patient delivering care and providing education/counseling/resources        Family, social and cultural characteristics were assessed during the visit and plan.     Patient was informed they can call the clinic at [phone] with any additional questions or concerns.

## 2023-08-08 ENCOUNTER — Ambulatory Visit: Admit: 2023-08-08 | Discharge: 2023-08-09 | Payer: MEDICAID | Attending: Internal Medicine | Primary: Internal Medicine

## 2023-08-08 DIAGNOSIS — E785 Hyperlipidemia, unspecified: Principal | ICD-10-CM

## 2023-08-08 DIAGNOSIS — N401 Enlarged prostate with lower urinary tract symptoms: Principal | ICD-10-CM

## 2023-08-08 DIAGNOSIS — Z85118 Personal history of other malignant neoplasm of bronchus and lung: Principal | ICD-10-CM

## 2023-08-08 DIAGNOSIS — E119 Type 2 diabetes mellitus without complications: Principal | ICD-10-CM

## 2023-08-08 DIAGNOSIS — N289 Disorder of kidney and ureter, unspecified: Principal | ICD-10-CM

## 2023-08-08 DIAGNOSIS — R2689 Other abnormalities of gait and mobility: Principal | ICD-10-CM

## 2023-08-08 DIAGNOSIS — E875 Hyperkalemia: Principal | ICD-10-CM

## 2023-08-08 DIAGNOSIS — K219 Gastro-esophageal reflux disease without esophagitis: Principal | ICD-10-CM

## 2023-08-08 DIAGNOSIS — J4489 Asthma-COPD overlap syndrome: Principal | ICD-10-CM

## 2023-08-08 DIAGNOSIS — H903 Sensorineural hearing loss, bilateral: Principal | ICD-10-CM

## 2023-08-08 DIAGNOSIS — I10 Essential (primary) hypertension: Principal | ICD-10-CM

## 2023-08-08 DIAGNOSIS — N179 Acute kidney failure, unspecified: Principal | ICD-10-CM

## 2023-08-08 DIAGNOSIS — N1831 Stage 3a chronic kidney disease (CMS-HCC): Principal | ICD-10-CM

## 2023-08-08 DIAGNOSIS — Z720 Tobacco use: Principal | ICD-10-CM

## 2023-08-08 DIAGNOSIS — H04123 Dry eye syndrome of bilateral lacrimal glands: Principal | ICD-10-CM

## 2023-08-08 DIAGNOSIS — Z09 Encounter for follow-up examination after completed treatment for conditions other than malignant neoplasm: Principal | ICD-10-CM

## 2023-08-08 DIAGNOSIS — R3914 Feeling of incomplete bladder emptying: Principal | ICD-10-CM

## 2023-08-08 LAB — BASIC METABOLIC PANEL
ANION GAP: 9 mmol/L (ref 5–14)
BLOOD UREA NITROGEN: 29 mg/dL — ABNORMAL HIGH (ref 9–23)
BUN / CREAT RATIO: 19
CALCIUM: 9.9 mg/dL (ref 8.7–10.4)
CHLORIDE: 105 mmol/L (ref 98–107)
CO2: 24 mmol/L (ref 20.0–31.0)
CREATININE: 1.51 mg/dL — ABNORMAL HIGH (ref 0.73–1.18)
EGFR CKD-EPI (2021) MALE: 47 mL/min/1.73m2 — ABNORMAL LOW (ref >=60)
GLUCOSE RANDOM: 124 mg/dL (ref 70–179)
POTASSIUM: 5.2 mmol/L — ABNORMAL HIGH (ref 3.4–4.8)
SODIUM: 138 mmol/L (ref 135–145)

## 2023-08-08 MED ORDER — ACCU-CHEK GUIDE TEST STRIPS
ORAL_STRIP | ORAL | 3 refills | 0.00000 days | Status: CP
Start: 2023-08-08 — End: ?

## 2023-08-08 MED ORDER — LANCETS
11 refills | 0.00000 days | Status: CP
Start: 2023-08-08 — End: 2024-08-07

## 2023-08-08 NOTE — Unmapped (Signed)
 [facility]     Reason for visit: Hospital Follow up    Questions / Concerns that need to be addressed:       Omron BPs (complete if screening BP has a systolic  > 130 or diastolic > 80)  BP#1 142/84   BP#2 140/79  BP#3 142/80    Average BP 141/81  (please note this as a comment in vitals)     PTHomeBP       Dexcom or Libre CGM in use? If so, pull appropriate reporting through portal (Dexcom) or EPIC order [name]).    HCDM reviewed and updated in Epic:    We are working to make sure all of our patients??? wishes are updated in Epic and part of that is documenting a Environmental health practitioner for each patient  A Health Care Decision Maker is someone you choose who can make health care decisions for you if you are not able - who would you most want to do this for you????  is already up to date.    HCDM (patient stated preference): Lee Baxter - Daughter - [phone]    BPAs completed:      Annual Screenings:     __________________________________________________________________________________________    SCREENINGS COMPLETED IN FLOWSHEETS      AUDIT       PHQ2       PHQ9          GAD7       COPD Assessment       Falls Risk

## 2023-08-08 NOTE — Unmapped (Signed)
 Reason for Visit: Hospital Follow-up Medication Management    Assessment and Plan:     # HTN, dizziness: Patient reports dizziness with positional change and home BP of 133/82. Taking tamsulosin 0.4 mg BID and finasteride 5 mg daily, which can contribute to orthostatic hypotension.   Continue to hold lisinopril 10 mg   BMP today  Bring log of home BP or home monitor to PCP visit  Future consideration: restart lisinopril at 2.5 mg daily       # Medication Management   Encouraged use of medication pill box and reviewed appropriate use. Reviewed the indication, dose, and frequency of each medication with patient.       Recommendations and medication-related problems were discussed directly with the patient's transitions of care physician, Dr. Reva Castleman, immediately following the pharmacist visit prior to the physician visit. Questions/concerns were addressed to the patient's satisfaction. I spent a total of 30 minutes face to face with the patient delivering clinical care and providing education/counseling.      Ann Keto, PharmD CPP  Clinical Pharmacist    ___________________________________________________     History of Present Illness:  Lee Baxter is a 78 y.o. male with a past medical history of BPH, HTN, HLD, asthma-COPD, tobacco use (chew), history of lung cancer, T2DM, CKD stage 3 who was recently hospitalized from 07/30/23 to 07/31/23 for UTI with AKI. Pt presents to the [facility] for follow-up without all of his medication bottles.     At discharge,   Keflex 500 mg BID x 5 days (EOT: 08/08/23)   Hold lisinopril 10 mg daily    Since discharge, he has been feeling ok, with no bladder symtoms. His last BM was today at lunch (a little loose). He is checking BP at home- this morning it was 133/82 mmHg prior to medications. He reports some dizziness with positional change.     Discrepancies noted in medication review: not taking Miralax, senna 2 tabs at night only. Medication Adherence and Access:  Missed doses?: no  Uses pillbox?: yes  Anyone else assist with medication organization? yes- [name] at times, but he can manage on own  Current insurance coverage: [place] Medicaid and [place] Medicare   [facility] [place]   Medications affordable?: yes  Needs refills? no    Allergies:   Allergies   Allergen Reactions    Latex      Added based on information entered during case entry, please review and add reactions, type, and severity as needed       Medications: Medications reviewed in EPIC medication station and updated today by the clinical pharmacist practitioner.  Current Outpatient Medications on File Prior to Visit   Medication Sig Dispense    albuterol HFA 90 mcg/actuation inhaler TAKE 2 PUFFS BY MOUTH EVERY 6 HOURS AS NEEDED FOR WHEEZE PRN    atorvastatin (LIPITOR) 40 MG tablet TAKE 1 TABLET BY MOUTH EVERY DAY IN THE EVENING Taking     blood-glucose meter kit Disp. blood glucose meter kit preferred by patient's insurance. Dx: Diabetes, E11.9 Supplies- needs strips and lancets    [EXPIRED] cephalexin (KEFLEX) 500 MG capsule Take 1 capsule (500 mg total) by mouth two (2) times a day for 5 days. Completed     dupilumab (DUPIXENT) 300 mg/2 mL pen injector Inject the contents of 1 pen (300 mg) under the skin every fourteen (14) days. Last dose- 08/01/23    empty container Misc Use as directed to  dispose of Dupixent pens Supplies     EPINEPHrine (EPIPEN) 0.3 mg/0.3 mL injection Inject 0.3 mL (0.3 mg total) into the muscle once as needed for anaphylaxis for up to 1 dose. (Patient not taking: Reported on 07/30/2023) Not taking    finasteride (PROSCAR) 5 mg tablet Take 1 tablet (5 mg total) by mouth daily. Taking 1 daily    fluticasone propionate (FLONASE) 50 mcg/actuation nasal spray 2 sprays into each nostril daily. (Patient not taking: Reported on 07/30/2023)     ipratropium-albuterol (DUO-NEB) 0.5-2.5 mg/3 mL nebulizer Inhale 1 vial ( 3 mL) by nebulization every six (6) hours. Use as directed PRN      [Paused] lisinopril (PRINIVIL,ZESTRIL) 10 MG tablet Take 1 tablet (10 mg total) by mouth daily. Paused     MEDICAL SUPPLY ITEM Pt has DM hx of neuropathy w/ callus. Followed for comprehensive care. For med records, fax release to [phone]. (Patient not taking: Reported on 07/30/2023) Supplies    MEDICAL SUPPLY ITEM 1 pair of diabetic shoes and 3 pairs of inserts.  Pt has DM with neuropathy w/ callus. Followed for comprehensive care. For med records, fax release to [phone] (Patient not taking: Reported on 07/30/2023) supplies    metFORMIN (GLUCOPHAGE) 500 MG tablet TAKE 1 TABLET (500MG  TOTAL) BY MOUTH IN THE MORNING AND TAKE 1 TABLET IN THE EVENING WITH MEALS 1 BID    ofloxacin (OCUFLOX) 0.3 % ophthalmic solution APPLY 2 DROPS (OPHTHALMIC (EYE)) 4 TIMES PER DAY FOR 5 DAYS ? remove    oxyCODONE-acetaminophen (PERCOCET) 5-325 mg per tablet Take 1 tablet by mouth every four (4) hours as needed for pain.     [START ON 08/30/2023] oxyCODONE-acetaminophen (PERCOCET) 5-325 mg per tablet Take 1 tablet by mouth every four (4) hours as needed for pain. future    [START ON 09/29/2023] oxyCODONE-acetaminophen (PERCOCET) 5-325 mg per tablet Take 1 tablet by mouth every four (4) hours as needed for pain. future    polyethylene glycol (MIRALAX) 17 gram packet Take 17 g by mouth two (2) times a day. Not taking    senna (SENOKOT) 8.6 mg tablet Take 2 tablets by mouth nightly. 2 at night      sodium chloride 3 % NEBULIZER solution Inhale 1 vial ( 4 mL) by nebulization two (2) times a day.     tamsulosin (FLOMAX) 0.4 mg capsule Take 1 capsule (0.4 mg total) by mouth two (2) times a day. 1 BID    TRELEGY ELLIPTA 200-62.5-25 mcg DsDv INHALE 1 PUFF IN THE MORNING 1 puff daily      No current facility-administered medications on file prior to visit.       Future Appointments   Date Time Provider Department Center   08/08/2023  3:00 PM [facility] HOSPITAL FOLLOW-UP [facility]   08/13/2023 8:00 AM Raynor, Randal Bury, MD [facility]   08/31/2023  9:00 AM IC CT RM 2 [facility] [place] [facility]   08/31/2023 10:30 AM Fernanda Howell, MD [facility]   11/02/2023  8:30 AM Lucy Sack, MD [facility]   11/21/2023 10:00 AM Shellia Dial, MD [facility]

## 2023-08-08 NOTE — Unmapped (Addendum)
 VISIT SUMMARY:    You had a follow-up appointment after your recent hospitalization for urinary retention. You are feeling much better now, with no issues related to bladder emptying, fevers, chills, or blood in your urine. We discussed your current medications, upcoming specialist appointments, and overall health management.    YOUR PLAN:    -URINARY RETENTION: Your urinary retention has resolved, and you are not experiencing any bladder emptying issues. Please continue with your follow-up appointments with urology as scheduled.  Benign prostatic hyperplasia is an enlarged prostate gland that can cause urinary problems.     -ORTHOSTATIC HYPOTENSION: Your current medications may worsen dizziness and lightheadedness. Please discuss with your urologist the possibility of reducing the doses of Proscar and/or Flomax.    -LESION ON KIDNEY: A small lesion was found on your kidney during a scan. A follow-up MRI is needed to further investigate this lesion, this was ordered today, you can call and schedule for when is convenient. We will review the MRI results once they are available.    -TYPE 2 DIABETES MELLITUS: We discussed your diabetes management and will [facility].    -CONSTIPATION: You are currently not experiencing constipation and have regular bowel movements with the use of Senna. Please continue taking 2 Senna tablets nightly. If your stools become hard or difficult to pass, you can add MiraLAX.    INSTRUCTIONS:    Please follow up with your urology and kidney specialist appointments as scheduled. Recheck your potassium and kidney function labs today. Based on the lab results, we may consider restarting lisinopril at a lower dose (2.5 mg). We will communicate the lab results and the decision about lisinopril through MyChart or a phone call. The MRI results will be automatically released to you once available.    A Cane order has been placed by your provider and sent to [facility] (P: [phone], F: [phone]). Please expect a call from the supplier to coordinate the delivery of your order. If you do not hear from the supplier within the next 1-2 weeks I would encourage you to contact the supplier directly at the number above.

## 2023-08-08 NOTE — Unmapped (Addendum)
 Internal Medicine Hospital Follow-Up Visit    ASSESSMENT / PLAN:  1. Hospital discharge follow-up    2. Primary hypertension    3. Sensorineural hearing loss (SNHL) of both ears    4. AKI (acute kidney injury)    5. Benign prostatic hyperplasia with incomplete bladder emptying    6. Stage 3a chronic kidney disease (CMS-HCC)    7. Asthma-COPD overlap syndrome      8. Dry eye syndrome of bilateral lacrimal glands    9. History of lung cancer    10. GERD without esophagitis    11. Hyperkalemia    12. Hyperlipidemia, unspecified hyperlipidemia type    13. Tobacco abuse    14. Renal lesion needs MRI    15. Balance disorder    16. Diabetes mellitus type 2 without retinopathy          Lee Baxter is a 78 y.o. male with pertinent PMHx of asthma-COPD overlap, HTN, CKD 3a, T2DM, HLD, GERD, BPH, prior LUL adenocarcinoma (s/p partial lobectomy), and RLL NSCLC (s/p radiation c/b radiation pneumonitis) who presented to [facility] due to hyperkalemia and AKI identified on outpatient labs in the setting of 2 weeks of urinary frequency/hesitancy/incomplete voiding, mild dysuria, and mild suprapubic discomfort, also found to have Right renal mass.      Urinary retention   BPH  PVR of >277mL after voiding on presentation also with constipation  likely contributing. No hydronephrosis but AKI with hyper K iso ACE. The day following admission, foley was removed and patient passed his trial of void. Urine culture with mixed UG flora.  Patient reports symptoms have now completely resolved.  CT shows enlarged prostate.  -Proscar  5  -Flomax  bid  -Given patient's intermittent orthostatic hypotension.  Concerned that the 2 above medications may be contributing.  Would love to at least reduce Flomax  to daily dosing when he follows up with urology.     AKI on  CKD 3a (improving)  Creatinine on presentation elevated to 2.47 from baseline ~1.3 (though has fluctuated significantly up to ~1.6 in the past). Creatinine improved after fluids and placement of foley.  -Chem today -creatinine improved to 1.51 since discharge.  This is close to his baseline.     Complicated cystitis UG Flora on cx  cephalexin  500 mg BID to start on 5/20 for total 7 days patient completed course     Hyperkalemia (improving)  K 7 on outpatient labs, improved to 6 on presentation to the ED, and 5.4 on recheck s/p insulin /glucose. Suspect due to AKI. Potassium 5.1 prior to discharge.   -Chem today -potassium today 5.2.  -ACE held on d/c -patient is blood pressures at home has been in the 130s over 80s range.  Given his elevated potassium I am not inclined to restart his lisinopril  at this time.  Patient advised to continue to monitor blood pressures and report if they are consistently greater than 140/90.     Right renal lesion  Patient with 1.1 cm exophytic left upper pole hyperattenuating lesion noted on CT A/P, with differential for radiology including proteinaceous cyst or RCC.  Discussed this with the family and discussed imaging.  -Ambulatory referral to urology -patient has an appointment coming up shortly  -MRI ordered today - RESULTED 7/9 benign lesion, no further imaging needed     Constipation   Patient presented with worsening constipation, which may have been contributing to his urinary symptoms as above. Received a SMOG enema with immediate resolution.  Discussed in detail  with family that constipation can lead to urinary retention.  So it is very important to stay on top of bowel habits.  Patient currently using senna.  -Miralax    -Senna 2qhs     Advanced care planning  DNR/DN.     HTN with intermittent orthostatic hypotension  HLD   - Do not restart ACE as above  -Atorvastatin  40    COPD-Eosinophilic Asthma  H/o Radiation Pneumonitis  -albuterol   -duo-nebs  -Trelegy  -Dupixent     T2DM:   A1c 7.2 - 07/30/23  -Metformin  500 bid    Dry eyes:   -Theratears       Gait imbalance: Prescribed cane  A four-point cane is medically necessary and will be ordered because the patient has a mobility limitation  - balance - that significantly impairs ability to participate in mobility-related activities of daily living (MRADLs), including transferring, in customary locations the home. Without correction, the patient will be unable to complete one or more MRADLs within a reasonable time frame. The patient is able to safely use a cane, which would sufficiently resolve their functional mobility deficit.     Patient provided with an updated and reconciled medications list.    Follow Up: Return in about 2 months (around 10/08/2023) for Recheck.  Future Appointments   Date Time Provider Department Center   08/13/2023  8:00 AM Raynor, Clorinda Smalls, MD [facility]   08/31/2023  9:00 AM IC CT RM 2 [facility] [place] - [facility]   08/31/2023 10:30 AM Jacklin Rosina Marcus, MD [facility]   10/08/2023  8:30 AM Manuela Sharrell Dotter, MD [facility]   11/21/2023 10:00 AM Phares Pauline Sic, MD [facility]     Medication adherence and barriers to the treatment plan have been addressed. Opportunities to optimize healthy behaviors have been discussed. Patient / caregiver voiced understanding.      CHIEF COMPLAINT/HISTORY OF PRESENT ILLNESS:   Hospital Follow-Up for Hospitalization Follow-up    History obtained from: Family member and Patient  Date of Hospitalization Discharge:  07/31/23     HPI:      Lee Baxter is a 78 year old male who presents for follow-up after hospitalization for urinary retention.    Since discharge, he feels significantly better with resolved urinary retention symptoms. He experiences no issues with bladder emptying, fevers, chills, or hematuria. A follow-up with urology is scheduled.    He takes Senna pills nightly for constipation, resulting in regular bowel movements without difficulty. No constipation since discharge.    During hospitalization, a scan revealed a small kidney lesion, and he has an upcoming appointment with a kidney specialist. He has a history of high potassium levels and kidney issues noted during hospitalization.    Current medications include Proscar , finasteride , and Flomax  for bladder management, with awareness of potential side effects such as lightheadedness and dizziness.        MEDICATIONS AND ALLERGIES:   Reviewed and updated in Epic.  Medication and allergy review was completed by the pharmacist and discussed during this visit. Please see their note within this encounter.    SOCIAL/FAMILY HISTORY:  Social History:  Reviewed in Epic.  Biopsychosocial barriers to care were addressed by the social worker this visit.     REVIEW OF SYSTEMS:    All other systems reviewed are negative except as noted here or in HPI.    PHYSICAL EXAM  Vitals:    Vitals:    08/08/23 1443   BP:  141/81   Pulse: 94   Resp: 16   Temp: 35.7 ??C (96.2 ??F)   SpO2: 99%       Exam: Upper dentures only  General:  NAD  Lungs:  CTA B  Heart:  RRR  Ext:  No c/c/e    Labs:  Reviewed from discharge.  See Epic labs.   Radiology: Reviewed radiology studies from discharge. See in Epic.   Reviewed: Discharge summary, Labs, Procedures, and Imaging (e.g. xrays, CT, etc); See results in Epic  Discussed care with: Nurse, learning disability by phone or other method (Encounter type: Patient Outreach):     Patient Outreach History (Since 07/26/2023)       Transition of Care       Date Method of Outreach Associated Actions User Next Outreach    08/01/2023  8:30 AM Telephone  Thana Frutoso Sera, RN                    Successful contact made or 2 unsuccessful attempts within 2 business days    SUMMARY OF ASSOCIATED HOSPITALIZATION    Lee Baxter is a 78 y.o. male with pertinent PMHx of asthma-COPD overlap, HTN, CKD 3a, T2DM, HLD, GERD, BPH, prior LUL adenocarcinoma (s/p partial lobectomy), and RLL NSCLC (s/p radiation c/b radiation pneumonitis) who presented to [facility] due to hyperkalemia and AKI identified on outpatient labs in the setting of 2 weeks of urinary frequency/hesitancy/incomplete voiding, mild dysuria, and mild suprapubic discomfort, also found to have Right renal mass.      Urinary retention (resolved) - BPH  Patient with known BPH (on tamsulosin  and finasteride ), presented with 2 weeks of urinary hesitancy/urgency/incomplete voiding, found to have PVR of >256mL after voiding in the ED, all of which was concerning for urinary retention. Additionally, reported having infrequent, hard, ball-like stools, so suspect constipation was playing a role. No hydronephrosis seen on CT, but given convincing history, foley was placed with resolution of obstruction. The day following admission, foley was removed and patient passed his trial of void and stated that he no longer had the sensation of incomplete emptying.     AKI on  CKD 3a (improving)  Creatinine on presentation elevated to 2.47 from baseline ~1.3 (though has fluctuated significantly up to ~1.6 in the past). Suspected to be post-renal in etiology due to bladder outlet obstruction as above, though patient presented with poor PO intake so received 1L NS on arrival. His creatinine steadily improved after fluids and placement of foley with relief of obstruction. On discharge, he was making good urine without retention, creatinine was downtrending, and his LUTS were significantly improving.     Complicated cystitis  Patient presenting with suprapubic pain and mild possible dysuria in setting of suspected retention, along with UA with pyuria/large leuk esterase, positive nitrites, consistent with cystitis, though no CVA tenderness, fever, or leukocytosis to suggest pyelonephritis. No reported or documented history of urinary tract infections in the past. Suspect bladder outlet obstruction was precipitant for retrograde bacterial invasion. He received one dose of ceftriaxone  in ED, switched to cephalexin  500 mg BID to start on 5/20 for total 7 day course of antibiotics (including ceftriaxone ). Received first day's doses inpatient and will complete course as outpatient (5 more days).      Hyperkalemia (improving)  K 7 on outpatient labs, improved to 6 on presentation to the ED, and 5.4 on recheck s/p insulin /glucose. Suspect due to AKI as above, as well as home lisinopril .  No chest pain or palpitations. ECG with NSR without stigmata of hyperkalemia (baseline borderline prolonged PR interval), though slight diffuse notching of QRS complex noted- unclear significance. He additionally received a 10g dose of Lokelma  with improvement of his potassium to 5.1 prior to discharge. Will follow up with PCP for lab recheck.     Right renal lesion  Patient with 1.1 cm exophytic left upper pole hyperattenuating lesion noted on CT A/P, with differential for radiology including proteinaceous cyst or RCC. Recommended non-urgent MRI abdomen/pelvis for better characterization. Patient also has reported ~3 months of weight loss (though on review of weight trend, appears he has ranged from 122-134 lbs over the past year, currently 123lbs).  Additionally, he has had increasing borderline hypercalcemia, which could go along with RCC. On discharge, advised PCP follow-up for further imaging/work-up. Ambulatory referral to urology also placed.     Constipation (resolved)  Patient presented with worsening constipation, which may have been contributing to his urinary symptoms as above. Received a SMOG enema with immediate resolution. Encouraged continued home Miralax  use as directed by his PCP.     Advanced care planning  Confirmed with patient on this admission that he would not want resuscitation or intubation in the setting of cardiopulmonary arrest. His code status was changed to DNR/DNI.     Chronic Problems  HTN: home lisinopril  held both during admission and on discharge in setting of AKI/hyperkalemia. Plan for PCP follow-up to discuss antihypertensive regimen.  COPD-Asthma: Continue home inhaler regimen.  T2DM: Metformin  held on admission in setting of AKI. Resumed home regimen on discharge.  HLD: Continue home atorvastatin  40mg .  Dry eyes: Continue home Theratears TID.

## 2023-08-13 ENCOUNTER — Ambulatory Visit: Admit: 2023-08-13 | Discharge: 2023-08-14 | Payer: MEDICAID

## 2023-08-13 DIAGNOSIS — N2889 Other specified disorders of kidney and ureter: Principal | ICD-10-CM

## 2023-08-13 DIAGNOSIS — N400 Enlarged prostate without lower urinary tract symptoms: Principal | ICD-10-CM

## 2023-08-13 MED ORDER — FINASTERIDE 5 MG TABLET
ORAL_TABLET | Freq: Every day | ORAL | 3 refills | 90.00000 days | Status: CP
Start: 2023-08-13 — End: 2024-07-15

## 2023-08-13 MED ORDER — TAMSULOSIN 0.4 MG CAPSULE
ORAL_CAPSULE | Freq: Two times a day (BID) | ORAL | 3 refills | 90.00000 days | Status: CP
Start: 2023-08-13 — End: ?

## 2023-08-13 NOTE — Unmapped (Signed)
 Assessment:    77yM BPH, small renal mass    Plan:    BPH - symptoms back to baseline.   Continue finasteride and tamsulosin.  May decrease tamsulosin back to once daily.  Continue senna for constipation.    Renal mass - consistent with hyperdense cyst.   Recommend no additional imaging surveillance, unless new symptoms.    Referring Physician:   Verlinda Gloss, MD  [address]  [address] [facility]  Medicine  [place],  [place] [address]    PCP:   Lucy Sack, MD    Chief Complaint   Patient presents with    BPH with obstruction/lower urinary tract symptoms       Subjective:    HPI:   78 y.o. male seen in consultation at the request of Verlinda Gloss, MD for evaluation. The patient has had concerns including BPH with obstruction/lower urinary tract symptoms.    Seen for evaluation of renal mass and BPH.  Recently admitted to hospital 07/2023 for worsening LUTS and incomplete emptying.  Had elevated PVR over 200. Foley placed.  Also had constipation at the time.  TOV performed and passed prior to discharge.  On flomax/finasteride for years.  Had stopped finasteride at some point prior to admission, but can't remember when.  Restarted finasteride and increased tamsulosin to bid.  No recent PSA - seen by Patra Bonnet 2019. Elected no PSA screening.  DM - last A1c 07/2023 7.2    Voiding well since discharge. No recurrent symptoms of LUTS.  Started on senna for constipation with improvement.    During recent admission, CT showed small renal mass.  Cr 1.5  CT non con 07/2023 reviewed - right simple renal cyst, 5mm non obstructing stone, left lateral small 1cm exophytic hyperdense mass  CT 2023 reviewed - simple appearing cyst in similar area on left kidney    PVR 182      PMH:  Past Medical History:   Diagnosis Date    Adenomatous colon polyp     Asthma (HHS-HCC)     Cervical disc disorder 04/17/2013    cervical spine MRI from February 2014 discharge DJD and foraminal stenosis at C4- 5, C3- 4 and C6- 7. Cervical stenosis of spinal canal 11/06/2019    COPD (chronic obstructive pulmonary disease)       Diabetes mellitus   Dx 1990    Type II    Diverticulosis     Hemorrhoids, internal     HL (hearing loss)     Hypertension     Lung cancer   1992    Pleural effusion, right 06/25/2022    PVD (posterior vitreous detachment), left eye     Sinusitis     Temporomandibular joint disorders 01/15/2012       PSH:  Past Surgical History:   Procedure Laterality Date    CATARACT EXTRACTION W/ INTRAOCULAR LENS IMPLANT Left 02/13/2006    Hecker MD    CATARACT EXTRACTION W/ INTRAOCULAR LENS IMPLANT Right 03/27/2006    Hecker MD    CHOLECYSTECTOMY      HERNIA REPAIR      KIDNEY STONE SURGERY      LUNG LOBECTOMY Left 1992    PR ALLOGRAFT FOR SPINE SURGERY ONLY MORSELIZED N/A 11/06/2019    Procedure: ALLOGRAFT FOR SPINE SURGERY ONLY; MORSELIZED;  Surgeon: Kristie Pew, MD;  Location[facility] OR [facility];  Service: Orthopedics    PR ARTHRD PST/PSTLAT TQ 1NTRSPC CRV BELW C2 SEGMENT N/A 11/06/2019  Procedure: ARTHRODESIS, POSTERIOR OR POSTEROLATERAL TECHNIQUE, SINGLE LEVEL; CERVICAL BELOW C2 SEGMENT;  Surgeon: Kristie Pew, MD;  Location: [facility] OR [facility];  Service: Orthopedics    PR ARTHRODESIS PST/PSTLAT TQ 1NTRSPC EA ADDL NTRSPC N/A 11/06/2019    Procedure: ARTHRODESIS, POSTERIOR OR POSTEROLATERAL TECHNIQUE, SINGLE LEVEL; EACH ADDITIONAL VERTEBRAL SEGMENT   x4;  Surgeon: Kristie Pew, MD;  Location: [facility] OR [facility];  Service: Orthopedics    PR AUTOGRAFT SPINE SURGERY LOCAL FROM SAME INCISION N/A 11/06/2019    Procedure: AUTOGRAFT/SPINE SURG ONLY (W/HARVEST GRAFT); LOCAL (EG, RIB/SPINOUS PROC, LAM FRGMT) OBTAIN FROM SAME INCIS;  Surgeon: Kristie Pew, MD;  Location: [facility] OR [facility];  Service: Orthopedics    PR BRNSCHSC TNDSC EBUS DX/TX INTERVENTION [facility] LES Right 11/21/2021    Procedure: BRONCH, RIGID OR FLEXIBLE, INCLUDING FLUORO GUIDANCE, WHEN PERFORMED; WITH TRANSENDOSCOPIC EBUS DURING BRONCHOSCOPIC DIAGNOSTIC OR THERAPEUTIC INTERVENTION(S) FOR PERIPHERAL LESION(S);  Surgeon: Vera Gip, MD;  Location: MAIN OR [place];  Service: Pulmonary    PR BRONCHOSCOPY,COMPUTER ASSIST/IMAGE-GUIDED NAVIGATION N/A 11/21/2021    Procedure: ROBOT ION BRONCHOSCOPY,RIGID OR FLEXIBLE,INCLUDE FLUORO WHEN PERFORMED; W/COMPUTER-ASSIST,IMAGE-GUIDED NAVIGATION;  Surgeon: Vera Gip, MD;  Location: MAIN OR [place];  Service: Pulmonary    PR BRONCHOSCOPY,COMPUTER ASSIST/IMAGE-GUIDED NAVIGATION N/A 11/21/2021    Procedure: BRONCHOSCOPY, RIGID OR FLEXIBLE, INCLUDE FLUORO WHEN PERFORMED; W/COMPUTER-ASSIST, IMAGE-GUIDED NAVIGATION;  Surgeon: Vera Gip, MD;  Location: MAIN OR [place];  Service: Pulmonary    PR BRONCHOSCOPY,DIAGNOSTIC W LAVAGE Right 06/27/2022    Procedure: BRONCHOSCOPY, RIGID OR FLEXIBLE, INCLUDE FLUOROSCOPIC GUIDANCE WHEN PERFORMED; W/BRONCHIAL ALVEOLAR LAVAGE;  Surgeon: Filbert Huff, MD;  Location: MAIN OR [place];  Service: Pulmonary    PR BRONCHOSCOPY,PLACEMENT FIDUCIAL MARKERS, 1/MULT N/A 11/21/2021    Procedure: BRONCHOSCOPY, RIGID OR FLEXIBLE, FLOURO WHEN PERFORMED; PLACEMENT OF FIDUCIAL MARKERS, SINGLE OR MULTIPLE;  Surgeon: Vera Gip, MD;  Location: MAIN OR [place];  Service: Pulmonary    PR BRONCHOSCOPY,TRANSBRON ASPIR BX Right 11/21/2021    Procedure: BRONCHOSCOPY, RIGID/FLEX, INCL FLUORO; W/TRANSBRONCH NDL ASPIRAT BX, TRACHEA, MAIN STEM &/OR LOBAR BRONCHUS;  Surgeon: Vera Gip, MD;  Location: MAIN OR [place];  Service: Pulmonary    PR BRONCHOSCOPY,TRANSBRONCH BIOPSY Right 11/21/2021    Procedure: BRONCHOSCOPY, RIGID/FLEXIBLE, INCLUDE FLUORO GUIDANCE WHEN PERFORMED; W/TRANSBRONCHIAL LUNG BX, SINGLE LOBE;  Surgeon: Vera Gip, MD;  Location: MAIN OR [place];  Service: Pulmonary    PR C-LAMINOPLASTY W/GRAFT/PLATE, 2 OR MORE N/A 11/06/2019    Procedure: LAMINOPLASTY, CERVICAL, DECOMPRESS SPINAL CORD, 2+ VERTEBRAL SEGMENTS; W/RECONSTRUCT POSTERIOR BONY ELEMENT;  Surgeon: Kristie Pew, MD; Location: [facility] OR [facility];  Service: Orthopedics    PR COLONOSCOPY W/BIOPSY SINGLE/MULTIPLE N/A 07/05/2017    Procedure: COLONOSCOPY, FLEXIBLE, PROXIMAL TO SPLENIC FLEXURE; WITH BIOPSY, SINGLE OR MULTIPLE;  Surgeon: Marsh Skeans, MD;  [facility];  Service: Gastroenterology    PR IONM 1 ON 1 IN OR W/ATTENDANCE EACH 15 MINUTES N/A 11/06/2019    Procedure: CONTINUOUS INTRAOPERATIVE NEUROPHYSIOLOGY MONITORING IN OR;  Surgeon: Kristie Pew, MD;  Location: [facility] OR [facility];  Service: Orthopedics    PR LAM W/O FACETEC FORAMOT/DSKC 1/2 VRT SEG, CERVICAL N/A 11/06/2019    Procedure: LAMINECT W/EXPLOR WO FACETECT 1-2 VERTEB; CERV;  Surgeon: Kristie Pew, MD;  [facility]: [facility] OR [facility];  Service: Orthopedics    PR POSTERIOR SEGMENTAL INSTRUMENTATION 3-6 VRT SEG N/A 11/06/2019    Procedure: POST SEGMT INSTRUM; 3 TO 6 VERTEB SEGMT CERVICAL;  Surgeon: Kristie Pew, MD;  Location: [facility] OR [facility];  Service: Orthopedics    SINUS SURGERY         Medications:  Current Outpatient Medications   Medication Sig Dispense Refill    albuterol HFA 90 mcg/actuation inhaler TAKE 2 PUFFS BY MOUTH EVERY 6 HOURS AS NEEDED FOR WHEEZE 8 g 5    atorvastatin (LIPITOR) 40 MG tablet TAKE 1 TABLET BY MOUTH EVERY DAY IN THE EVENING 90 tablet 3    blood sugar diagnostic (ACCU-CHEK GUIDE TEST STRIPS) Strp Use to check blood sugar three times daily as instructed by clinic. E11.9 300 strip 3    blood-glucose meter kit Disp. blood glucose meter kit preferred by patient's insurance. Dx: Diabetes, E11.9 1 each 11    dupilumab (DUPIXENT) 300 mg/2 mL pen injector Inject the contents of 1 pen (300 mg) under the skin every fourteen (14) days. 4 mL 11    empty container Misc Use as directed to dispose of Dupixent pens 1 each 2    EPINEPHrine (EPIPEN) 0.3 mg/0.3 mL injection Inject 0.3 mL (0.3 mg total) into the muscle once as needed for anaphylaxis for up to 1 dose. (Patient not taking: Reported on 07/30/2023) 1 each 0 finasteride (PROSCAR) 5 mg tablet Take 1 tablet (5 mg total) by mouth daily. 90 tablet 3    fluticasone propionate (FLONASE) 50 mcg/actuation nasal spray 2 sprays into each nostril daily. (Patient not taking: Reported on 07/30/2023) 48 mL 7    ipratropium-albuterol (DUO-NEB) 0.5-2.5 mg/3 mL nebulizer Inhale 1 vial ( 3 mL) by nebulization every six (6) hours. Use as directed 1080 mL 1    lancets Misc Disp. lancets #100 or amount allowed, Testing BID. Dx: E11.9,Z79.4 (DM Type 2- controlled, insulin dep) 100 each 11    [Paused] lisinopril (PRINIVIL,ZESTRIL) 10 MG tablet Take 1 tablet (10 mg total) by mouth daily. 90 tablet 3    MEDICAL SUPPLY ITEM Pt has DM hx of neuropathy w/ callus. Followed for comprehensive care. For med records, fax release to [phone]. (Patient not taking: Reported on 07/30/2023) 2 Units 0    MEDICAL SUPPLY ITEM 1 pair of diabetic shoes and 3 pairs of inserts.  Pt has DM with neuropathy w/ callus. Followed for comprehensive care. For med records, fax release to [phone] (Patient not taking: Reported on 07/30/2023) 2 each 0    metFORMIN (GLUCOPHAGE) 500 MG tablet TAKE 1 TABLET (500MG  TOTAL) BY MOUTH IN THE MORNING AND TAKE 1 TABLET IN THE EVENING WITH MEALS 180 tablet 3    ofloxacin (OCUFLOX) 0.3 % ophthalmic solution APPLY 2 DROPS (OPHTHALMIC (EYE)) 4 TIMES PER DAY FOR 5 DAYS      oxyCODONE-acetaminophen (PERCOCET) 5-325 mg per tablet Take 1 tablet by mouth every four (4) hours as needed for pain. 150 tablet 0    [START ON 08/30/2023] oxyCODONE-acetaminophen (PERCOCET) 5-325 mg per tablet Take 1 tablet by mouth every four (4) hours as needed for pain. 150 tablet 0    [START ON 09/29/2023] oxyCODONE-acetaminophen (PERCOCET) 5-325 mg per tablet Take 1 tablet by mouth every four (4) hours as needed for pain. 150 tablet 0    polyethylene glycol (MIRALAX) 17 gram packet Take 17 g by mouth two (2) times a day. 60 packet 0    senna (SENOKOT) 8.6 mg tablet Take 2 tablets by mouth nightly. 60 tablet 0 sodium chloride 3 % NEBULIZER solution Inhale 1 vial ( 4 mL) by nebulization two (2) times a day. 720 mL 3    tamsulosin (FLOMAX) 0.4 mg  capsule Take 1 capsule (0.4 mg total) by mouth two (2) times a day. 180 capsule 3    TRELEGY ELLIPTA 200-62.5-25 mcg DsDv INHALE 1 PUFF IN THE MORNING 60 each 5     No current facility-administered medications for this visit.       Allergies:  Latex     Social History:  Patient  reports that he quit smoking about 36 years ago. His smoking use included cigarettes. He started smoking about 70 years ago. He has a 35 pack-year smoking history. His smokeless tobacco use includes chew. He reports that he does not drink alcohol and does not use drugs.     Family History:  The patient's family history includes Cancer in his daughter; Hypertension in his daughter; No Known Problems in his brother, father, maternal aunt, maternal grandfather, maternal grandmother, maternal uncle, mother, paternal aunt, paternal grandfather, paternal grandmother, paternal uncle, and sister; Strabismus in his daughter.     ROS:   A comprehensive 10-system review was negative, except as noted in HPI.    Physical Exam:    General: well developed, well nourished, no acute distress  HEENT: PERLA, EOM intact, normocephalic, atraumatic  Neck: supple and no masses  Chest: symmetrical  Lungs: non-labored breathing  Heart: normal rhythm, no JVD  Abdomen: no tenderness, no masses or hernias, no palpable organomegaly  GU:  Rectal:  Extremities: no deformities, no edema, no cyanosis

## 2023-08-22 NOTE — Unmapped (Signed)
 [facility] Refill Coordination Note    Specialty Medication(s) to be Shipped:   CF/Pulmonary/Asthma: Dupixent    Other medication(s) to be shipped: No additional medications requested for fill at this time     Lee Baxter, DOB: April 20, 1945  Phone: [phone] (home) [phone] (work)      All above HIPAA information was verified with patient's family member, daughter.     Was a Nurse, learning disability used for this call? No    Completed refill call assessment today to schedule patient's medication shipment from the [facility]  [phone]).  All relevant notes have been reviewed.     Specialty medication(s) and dose(s) confirmed: Regimen is correct and unchanged.   Changes to medications: Einar reports no changes at this time.  Changes to insurance: No  New side effects reported not previously addressed with a pharmacist or physician: None reported  Questions for the pharmacist: No    Confirmed patient received a Conservation officer, [name], mining with first shipment. The patient will receive a drug information handout for each medication shipped and additional FDA Medication Guides as required.       DISEASE/MEDICATION-SPECIFIC INFORMATION        For patients on injectable medications: Patient currently has 0 doses left.  Next injection is scheduled for 06/22.    SPECIALTY MEDICATION ADHERENCE     Medication Adherence    Patient reported X missed doses in the last month: 0  Specialty Medication: DUPIXENT PEN 300 mg/2 mL pen injector (dupilumab)  Patient is on additional specialty medications: No              Were doses missed due to medication being on hold? No    DUPIXENT PEN 300 mg/2 mL pen injector (dupilumab) : 0 doses of medicine on hand       REFERRAL TO PHARMACIST     Referral to the pharmacist: Not needed      SHIPPING     Shipping address confirmed in Epic.     Cost and Payment: Patient has a $0 copay, payment information is not required.    Delivery Scheduled: Yes, Expected medication delivery date: 06/17.     Medication will be delivered via UPS to the prescription address in Epic WAM.    Lee Baxter   [name]  Specialty Technician

## 2023-08-27 MED FILL — DUPIXENT 300 MG/2 ML SUBCUTANEOUS PEN INJECTOR: SUBCUTANEOUS | 28 days supply | Qty: 4 | Fill #11

## 2023-08-30 MED ORDER — OXYCODONE-ACETAMINOPHEN 5 MG-325 MG TABLET
ORAL_TABLET | ORAL | 0 refills | 25.00000 days | Status: CP | PRN
Start: 2023-08-30 — End: 2023-09-29

## 2023-08-31 ENCOUNTER — Inpatient Hospital Stay
Admit: 2023-08-31 | Discharge: 2023-09-01 | Payer: Medicare (Managed Care) | Attending: Radiation Oncology | Primary: Radiation Oncology

## 2023-08-31 ENCOUNTER — Inpatient Hospital Stay: Admit: 2023-08-31 | Discharge: 2023-08-31 | Payer: Medicare (Managed Care)

## 2023-08-31 MED ORDER — AMOXICILLIN 875 MG-POTASSIUM CLAVULANATE 125 MG TABLET
ORAL_TABLET | Freq: Two times a day (BID) | ORAL | 0 refills | 7.00000 days | Status: CP
Start: 2023-08-31 — End: 2023-09-07

## 2023-08-31 NOTE — Unmapped (Signed)
 Radiation Oncology Follow Up Visit Note    Patient Name: Lee Baxter  Patient Age: 78 y.o.  Encounter Date: 08/31/2023    Referring Physician:   Manuela Sharrell Dotter, MD  [address]  [address]  [place],  [place] [address]    Primary Care Provider:  Manuela Sharrell Dotter, MD    Diagnoses:  1. Malignant neoplasm of lower lobe of right lung         Treatment Site: TWO RLL lesions, 1000cGy x 5 fractions = 5000cGy, completed 01/23/22    Interval Since Completion of Treatment: 1 year and 6 months      Assessment: 78 y.o. male with radiographic diagnosis of synchronous RLL NSCLC 2.9 and 2.5 cm lesions without pathologic confirmation (nondiagnostic) but in the setting of significant smoking history    CT Chest today was personally reviewed and concerning for lobar pneumonia      Plan:  Course of augmentin   Re-image in 8 weeks       Interval History:  Lee Baxter returns today for a regularly scheduled follow-up, he was last seen in clinic 3 months ago.        He has a history of tobacco use, specifically chewing tobacco, and is concerned about its potential contribution to the inflammation observed on the scan. He quit smoking cigarettes forty years ago. He feels 'run down' and has difficulty tolerating temperature extremes, which affects his ability to assist with outdoor activities.    He uses a Trelegy inhaler for chronic obstructive pulmonary disease (COPD) and occasionally requires supplemental oxygen at night or when necessary. No recent fevers, chills, or cough, and he does not suspect pneumonia.    His appetite is decreased, but he maintains nutritional intake with Boost nutritional drinks, preferring the chocolate flavor. He consumes one to two drinks daily to prevent weight loss.    He experiences chronic back and neck pain, managed with pain medication as needed, especially at night. He has a history of back problems and has hardware in his neck.          Review of Systems: All other systems reviewed are negative. Pertinent positives and negatives are above in interval history.    Past Medical, Surgical, Family and Social Histories reviewed and updated in the electronic medical record.    Hematology/Oncology History   Lung cancer      12/15/2021 -  Cancer Staged    Staging form: Lung, AJCC 8th Edition  - Clinical stage from 12/15/2021: cT1, cN0, cM0 - Signed by Beauty Karilyn SAILOR, PA on 05/01/2023       01/17/2022 Initial Diagnosis    Lung cancer (CMS-HCC)           Physical Exam:  Vital Signs for this encounter:  There were no vitals taken for this visit.  Last weight:    Wt Readings from Last 4 Encounters:   08/13/23 57.1 kg (125 lb 12.8 oz)   08/08/23 57 kg (125 lb 10.6 oz)   07/31/23 56.7 kg (125 lb)   07/30/23 56.7 kg (125 lb)     Karnofsky/Lansky Performance Status: 70, Cares for self; unable to carry on normal activity or to do active work (ECOG equivalent 1)  General:   No acute distress, alert and oriented X 4   HEENT:  Normocephalic and atraumatic.  Neck/lymphatics:  Supple.  Respiratory:  Breathing is non-labored.  Extremities:  No cyanosis or edema.  Neuro:   Alert and oriented x4.  Radiology  1.  Right upper lobe pneumonia. Recommend follow-up CT chest in 4 to 6 weeks following treatment.   2.  Post left upper lobectomy with unchanged treated mass in the right lower lobe. No evidence of local disease recurrence or progressive thoracic metastatic disease.   3.  Trace right pleural effusion.   4.  Smoking-related lung disease with severe centrilobular emphysema.   5.  Extensive coronary artery calcifications.             Electronically signed by:  This encounter was related to my continuous and active collaborative plan of care related to this patient's complex condition.    I personally spent 30 minutes face-to-face and non-face-to-face in the care of this patient, which includes all pre, intra, and post visit time on the date of service.     Rosina A. Jacklin, MD, PhD  Assistant Professor  Department of Radiation Oncology  [facility]  [employer] of [employer]  [address], [address]  [place], [place] [address]  08/31/23 1:18 PM

## 2023-08-31 NOTE — Unmapped (Signed)
 08/31/2023      Subjective/Assessment/Recommendations:    1. Nutrition: No issues and Weight stable  2. Fatigue: No issues  3. Pain: Patient reports pain in back/shoulders, unrelated to treatment  4. Smoking Cessation: No  5. Prescription Needs: None  6. Psychosocial: No issues  7. Surveillance: Chest CT completed earlier today  8. Symptoms: no complaints  9. Other: Patient here for follow-up, completed CT scan earlier today

## 2023-09-19 ENCOUNTER — Inpatient Hospital Stay: Admit: 2023-09-19 | Discharge: 2023-09-19 | Payer: Medicare (Managed Care)

## 2023-09-19 MED ADMIN — gadopiclenol (ELUCIREM,VUEWAY) injection 5.5 mL: 5.5 mL | INTRAVENOUS | @ 14:00:00 | Stop: 2023-09-19

## 2023-09-23 DIAGNOSIS — J4489 Asthma-COPD overlap syndrome: Principal | ICD-10-CM

## 2023-09-23 DIAGNOSIS — J8283 Eosinophilic asthma (HHS-HCC): Principal | ICD-10-CM

## 2023-09-23 DIAGNOSIS — J4550 Severe persistent asthma, uncomplicated: Principal | ICD-10-CM

## 2023-09-23 MED ORDER — DUPILUMAB 300 MG/2 ML SUBCUTANEOUS PEN INJECTOR
SUBCUTANEOUS | 11 refills | 28.00000 days
Start: 2023-09-23 — End: ?

## 2023-09-24 DIAGNOSIS — J4489 Asthma-COPD overlap syndrome: Principal | ICD-10-CM

## 2023-09-24 DIAGNOSIS — J8283 Eosinophilic asthma (HHS-HCC): Principal | ICD-10-CM

## 2023-09-24 DIAGNOSIS — J4550 Severe persistent asthma, uncomplicated: Principal | ICD-10-CM

## 2023-09-24 MED ORDER — DUPILUMAB 300 MG/2 ML SUBCUTANEOUS PEN INJECTOR
SUBCUTANEOUS | 11 refills | 28.00000 days | Status: CP
Start: 2023-09-24 — End: ?
  Filled 2023-10-03: qty 4, 28d supply, fill #0

## 2023-09-24 NOTE — Unmapped (Signed)
 RX returned to local [facility], RX filled with [facility]

## 2023-09-24 NOTE — Unmapped (Signed)
 RX for Dupixent  received from local [facility] pharmacy    Last visit: 06/06/2023  Next visit: 11/21/2023    RX routed to Dr. Phares for review    Requested Prescriptions     Pending Prescriptions Disp Refills    dupilumab  (DUPIXENT ) 300 mg/2 mL pen injector 4 mL 11     Sig: Inject the contents of 1 pen (300 mg) under the skin every fourteen (14) days.

## 2023-09-25 NOTE — Unmapped (Signed)
 [facility] Refill Coordination Note    Lee Baxter, DOB: 12/05/1945  Phone: [phone] (home) [phone] (work)      All above HIPAA information was verified with patient.         09/23/2023     9:01 AM   Specialty Rx Medication Refill Questionnaire   Which Medications would you like refilled and shipped? Dupixent    Please list all current allergies: 0   Have you missed any doses in the last 30 days? No   Have you had any changes to your medication(s) since your last refill? No   How much of each medication do you have remaining at home? (eg. number of tablets, injections, etc.) 0   If receiving an injectable medication, next injection date is 09/23/2023   Have you experienced any side effects in the last 30 days? No   Please enter the full address (street address, city, state, zip code) where you would like your medication(s) to be delivered to. [address] [place] [address]   Please specify on which day you would like your medication(s) to arrive. Note: if you need your medication(s) within 3 days, please call the pharmacy to schedule your order at [phone]  10/04/2023   Has your insurance changed since your last refill? No   Would you like a pharmacist to call you to discuss your medication(s)? No   Do you require a signature for your package? (Note: if we are billing Medicare Part B or your order contains a controlled substance, we will require a signature) No   I have been provided my out of pocket cost for my medication and approve the pharmacy to charge the amount to my credit card on file. Yes         Completed refill call assessment today to schedule patient's medication shipment from the [facility] [phone]).  All relevant notes have been reviewed.       Confirmed patient received a Conservation officer, [name], mining with first shipment. The patient will receive a drug information handout for each medication shipped and additional FDA Medication Guides as required.         REFERRAL TO PHARMACIST     Referral to the pharmacist: Not needed      [facility]     Shipping address confirmed in Epic.     Delivery Scheduled: Yes, Expected medication delivery date: 07/24.     Medication will be delivered via UPS to the prescription address in Epic WAM.    Lee Baxter   [facility] Specialty Technician

## 2023-09-29 MED ORDER — OXYCODONE-ACETAMINOPHEN 5 MG-325 MG TABLET
ORAL_TABLET | ORAL | 0 refills | 25.00000 days | Status: CP | PRN
Start: 2023-09-29 — End: 2023-10-29

## 2023-09-30 ENCOUNTER — Inpatient Hospital Stay: Admit: 2023-09-30 | Discharge: 2023-09-30 | Payer: Medicare (Managed Care)

## 2023-10-01 NOTE — Unmapped (Signed)
 BRIEF PHONE NOTE:    Reviewed chest CT from yesterday which showed interval resolution of RUL pneumonia and overall otherwise stable.  Relayed results to daughter [name].  Will move back his follow-up and repeat scan to 6 months from now (currently scheduled in Sept, can be moved to Jan 2026.    Rosina A. Jacklin, MD, PhD  Assistant Professor  Department of Radiation Oncology  [facility]  [employer] of [employer]  [address]  [place], [place] [address]  10/01/23 2:39 PM

## 2023-10-04 DIAGNOSIS — I10 Essential (primary) hypertension: Principal | ICD-10-CM

## 2023-10-04 MED ORDER — LISINOPRIL 10 MG TABLET
ORAL_TABLET | Freq: Every day | ORAL | 3 refills | 90.00000 days
Start: 2023-10-04 — End: ?

## 2023-10-04 NOTE — Unmapped (Signed)
 Declined lisinopril  refill given persistent hyperkalemia.  Called the patient and family and left VM informing them that I will reassess this at his visit next week and make a decision regarding restarting the lisinopril .

## 2023-10-08 ENCOUNTER — Encounter
Admit: 2023-10-08 | Discharge: 2023-10-08 | Payer: Medicare (Managed Care) | Attending: Internal Medicine | Primary: Internal Medicine

## 2023-10-08 DIAGNOSIS — S46819A Strain of other muscles, fascia and tendons at shoulder and upper arm level, unspecified arm, initial encounter: Principal | ICD-10-CM

## 2023-10-08 DIAGNOSIS — N1831 Stage 3a chronic kidney disease (CMS-HCC): Principal | ICD-10-CM

## 2023-10-08 DIAGNOSIS — S40021D Contusion of right upper arm, subsequent encounter: Principal | ICD-10-CM

## 2023-10-08 DIAGNOSIS — S40022D Contusion of left upper arm, subsequent encounter: Principal | ICD-10-CM

## 2023-10-08 DIAGNOSIS — I10 Essential (primary) hypertension: Principal | ICD-10-CM

## 2023-10-08 DIAGNOSIS — L578 Other skin changes due to chronic exposure to nonionizing radiation: Principal | ICD-10-CM

## 2023-10-08 DIAGNOSIS — E119 Type 2 diabetes mellitus without complications: Principal | ICD-10-CM

## 2023-10-08 DIAGNOSIS — Z79891 Long term (current) use of opiate analgesic: Principal | ICD-10-CM

## 2023-10-08 LAB — BASIC METABOLIC PANEL
ANION GAP: 8 mmol/L (ref 5–14)
BLOOD UREA NITROGEN: 14 mg/dL (ref 9–23)
BUN / CREAT RATIO: 10
CALCIUM: 9.4 mg/dL (ref 8.7–10.4)
CHLORIDE: 107 mmol/L (ref 98–107)
CO2: 24.7 mmol/L (ref 20.0–31.0)
CREATININE: 1.47 mg/dL — ABNORMAL HIGH (ref 0.73–1.18)
EGFR CKD-EPI (2021) MALE: 48 mL/min/1.73m2 — ABNORMAL LOW (ref >=60)
GLUCOSE RANDOM: 135 mg/dL (ref 70–179)
POTASSIUM: 5.3 mmol/L — ABNORMAL HIGH (ref 3.4–4.8)
SODIUM: 140 mmol/L (ref 135–145)

## 2023-10-08 LAB — CBC
HEMATOCRIT: 40.6 % (ref 39.0–48.0)
HEMOGLOBIN: 13.6 g/dL (ref 12.9–16.5)
MEAN CORPUSCULAR HEMOGLOBIN CONC: 33.4 g/dL (ref 32.0–36.0)
MEAN CORPUSCULAR HEMOGLOBIN: 31.4 pg (ref 25.9–32.4)
MEAN CORPUSCULAR VOLUME: 94.1 fL (ref 77.6–95.7)
MEAN PLATELET VOLUME: 8.9 fL (ref 6.8–10.7)
PLATELET COUNT: 211 10*9/L (ref 150–450)
RED BLOOD CELL COUNT: 4.31 10*12/L (ref 4.26–5.60)
RED CELL DISTRIBUTION WIDTH: 14 % (ref 12.2–15.2)
WBC ADJUSTED: 8.1 10*9/L (ref 3.6–11.2)

## 2023-10-08 NOTE — Unmapped (Addendum)
 I will call you about your blood test results and make a decision whether you can start the blood pressure medicine.  You can purchase the lidocaine  4% patches at the pharmacy for the prescription and have it daughter put half or full patch on the back of the neck and upper back muscles once a day, maximum  12 hours as needed and then remove it.  You can use a heat pad to the upper neck muscles as needed.

## 2023-10-08 NOTE — Unmapped (Signed)
 [facility] at [facility]     Reason for visit: Follow up    Questions / Concerns that need to be addressed:     Omron BPs (complete if screening BP has a systolic  > 130 or diastolic > 80)  BP#1 160/86   BP#2 155/88  BP#3 159/85    Average BP 158/86  (please note this as a comment in vitals)     PTHomeBP           Dexcom or Libre CGM in use? If so, pull appropriate reporting through portal (Dexcom) or EPIC order [name]).    HCDM reviewed and updated in Epic:    We are working to make sure all of our patients??? wishes are updated in Epic and part of that is documenting a Environmental health practitioner for each patient  A Health Care Decision Maker is someone you choose who can make health care decisions for you if you are not able - who would you most want to do this for you????  is already up to date.    HCDM (patient stated preference): [name] - Daughter - [phone]    BPAs completed:      Annual Screenings:     __________________________________________________________________________________________    SCREENINGS COMPLETED IN FLOWSHEETS      AUDIT       PHQ2       PHQ9          GAD7       COPD Assessment       Falls Risk  Falls Risk  Have you fallen in the past year?: No  Do you feel unsteady when standing or walking?: No

## 2023-10-08 NOTE — Unmapped (Signed)
 HISTORY: Lee Baxter is a 78 year old gentleman with a history of lung malignancy s/p lobectomy and radiation with resultant radiation pneumonitis, diabetes, hypertension, stage 3a CKD and asthma-COPD overlap who presents for follow-up.     At his last visit on 07/30/2023, he was found to have hyperkalemia with a potassium of 7.0, AKI on CKD and concern for UTI/prostatitis.  He was referred to [facility] ED and was admitted from 07/30/2023 and discharged on 07/31/2023, with urinary retention PVR more than 292 mL, lisinopril  held and treated with ceftriaxone  x 1 dose and discharged on cephalexin  which he has completed.  He has a right renal mass on imaging that was deemed benign on MRI obtained on 09/19/2023.  At the time of discharge, potassium was 5.1, creatinine 1.90, improved but not back to previous level.    On 08/08/2023 he was seen at our hospitalization follow-up clinic where he was clinically stable and his BP was 141/81 off BP medications.  On that day, potassium was 5.2 and the creatinine had continued to improve to 1.51.    Today, he states he remains off lisinopril .  He feels well.  He checked his blood pressure yesterday and it was 150/84 and today is 146/92.  No chest pain, shortness of breath, leg swelling.    Regarding his BPH, he states his urine is okay without dysuria or hematuria.  He takes his tamsulosin  0.4 mg twice a day and finasteride  5 mg a day.      Regarding his lung carcinoma, he was seen by radiation oncology on 08/31/2023 at which time a CT chest was concerning for right upper lobe pneumonia treated with Augmentin .  Follow-up CT chest on 09/30/2023 showed near complete resolution of this pneumonia.  Today, he states he does not have any cough or phlegm production.  He only becomes short of breath if he does a lot of physical activity.    Regarding his chronic pain, he describes the pain in the back of his neck which has had previous cervical spine surgery, radiating to the back of both shoulders.  Sometimes the pain goes down to his tailbone.  It does not limit him from walking or use of his hands.  No falls.  When I asked him to quantify his pain on a scale, he was unable to do so.  He request refills of his oxycodone -acetaminophen  5-3 25 last taken this morning.  He states he has few of these pills left and he usually uses 1 a week.  He is usually prescribed 150 pills a month as he had been taking 5 a day in the past and today this was addressed and this examiner informed him that he may not need as many pills for pain, he then stated that he was actually was taking them four a week.  On 04/30/2023, both his urine toxicology and opiate confirmation test were negative reflecting that he was not taking his narcotic medicines as he reported, see previous notes.. Of note is that his daughter, Lee Baxter, contacted me recently and informed me that the patient is diverting all of his opiate pills to someone else.  When I asked the patient regarding this today, he states he takes his medicines as above.  However, his attitude and demeanor changed when stating this, avoiding looking me in the eye and no longer being the gregarious self he usually is.    He states that his skin on his forearms easily bruises and his dog scratched his arms in the  last few days.  He has a lot of sun damage on his forearms.    Problem List[1]   Current Medications[2]  Allergies[3]     SOCIAL History: as above.    PHYSICAL:  General: Well-developed, well-groomed animated gentleman who is in no distress.  His affect became more guarded after discussion about his pain medication use.  Vitals: Repeat BP 147/89 when I repeated it.  Vitals:    10/08/23 0812 10/08/23 0846   BP: 158/86 147/89   BP Site:  L Arm   BP Position: Sitting Sitting   BP Cuff Size:  Large   Pulse: 84 88   Resp: 16    Temp: 35.9 ??C (96.6 ??F)    TempSrc: Temporal    SpO2: 96%    Weight: 58.5 kg (129 lb)    Height: 165.1 cm (5' 5)       BP Readings from Last 3 Encounters:   10/08/23 147/89   08/31/23 117/60   08/13/23 140/80     Wt Readings from Last 3 Encounters:   10/08/23 58.5 kg (129 lb)   08/31/23 57.6 kg (127 lb)   08/13/23 57.1 kg (125 lb 12.8 oz)       HEENT: Eyeglasses in place.  Atraumatic, conjunctivae normal, anicteric sclerae. Chest: Chronic bibasilar crackles, otherwise clear to auscultation.  CV: Normal S1 and S2, no murmur.  Rate regular.  Abdomen: Nontender to palpation.  MSK: Tender palpation of bilateral trapezius muscles reproducing his symptoms.  No tenderness on palpation of the posterior neck or lumbar spine.  Negative straight leg raise bilaterally.  Skin: Chronic sun damage to forearms, healing abrasions, few ecchymoses.  Ext:  No pedal edema    DIAGNOSTIC TESTS:     See HPI.  Otherwise reviewed in epic.    09/19/2023, MRI abdomen with and without contrast:  Denies  Impression      Exophytic left upper pole benign-appearing Bosniak 2 cyst requiring no additional follow-up.      Scattered bilateral simple renal cysts.      No concerning renal lesions.       07/30/2023: Hemoglobin A1c 7.2%.      ASSESSMENT AND PLANS:    -- 1.  Primary hypertension, slightly elevated BP but would be acceptable given his age, chronic illnesses and DNR/DNI status.  Check BMP today that will help guide consideration whether lisinopril  or any other BP medication (previously used nifedipine ) should be started.    -- 2.  CKD 3, check BMP today.    -- 3.  Diabetes mellitus type 2, few home BG readings available that are acceptable.  Last A1c 7.2%, acceptable.  Continue metformin  500 mg twice a day.    -- 4.  Trapezius muscle strain, educated patient regarding this.  Use heat pad as needed.  Asked him to use lidocaine  4% patch as needed but he states he prefers not to use this.  Consider physical therapy in the future.    --5.  Chronic opiate use, mostly for prior cervical spine OA and underwent subsequent surgery for this years ago.  Clinically today without radiculopathy. There is high concern for diversion of opiates per his daughter's communication by telephone (unbeknownst to the patient whom she does not want to know that she called).  When confronted about this today, patient states he is taking the opiate medicine but changed the amount of the opiate he initially reported using and his affect changed.  Ordered urine toxicology and opiate urine confirmation test but  the patient stated he would not be able to provide enough urine as he had emptied his bladder upon arrival to the office today (despite providing water  at the end of the visit, he could not make sufficient urine).  Discussed that I would at the most provide him with a small supply of his opiate medication and then discontinue it.  Use acetaminophen  500 to 1000 mg every 8 hours as needed.He had failed low-dose duloxetine  before and he had to stop it because it made him feel sick and sleepy.     --6.  Right renal mass, MRI 09/19/2023 shows benign Bosniak cyst not needing further follow-up.  Patient informed about this again.    --7.  Ecchymosis upper extremities, most likely due to thin skin from sun damage to the skin.  No report of other mucosal bleeding making it less likely a platelet disorder but will check CBC.    --8.  Sun damage of skin, recommended wearing sunblock, protect skin from sun.  Consider referral to dermatologist in the future.    --9.  BPH with recent urinary retention, patient states he is urinating well.  Continue tamsulosin  0.8 mg a day and finasteride  5 mg a day.  Check creatinine.  Consider repeating postvoid residual in the future depending on symptomatology.    -10.  He has significant asthma and COPD, well-controlled with Trelegy and doxepin that he will continue.  Follow-up with pulmonologist as scheduled.    -11.  Recent RUL pneumonia, treated with Augmentin , mostly resolved by recent subsequent CT chest.  Currently asymptomatic, will observe.    -12.Right lower lobe non-small cell lung cancer not pathologically confirmed, status post radiation therapy.  Patient's weight is stable.  Continue following with [facility] radiation oncology.    -13.  General health, reminded to obtain Tdap and RSV vaccine using prescriptions previously sent to the pharmacy.  Will recommend flu vaccine and COVID booster in the fall 2025.    Follow-up with me in 3 months or as needed.    This note was dictated using voice recognition software.  Please forgive typos.      Addendum (10/12/2023): His daughter Lee Baxter called me again and this time informed me that the patient is selling his opiates and she is concerned about him his safety and risk of going to jail.  She has asked that we not to inform him that she provided this information.    Given above information, will not provide narcotics to the patient.    Addendum (10/17/2023): Spoke with the patient today who again stated he had resumed taking oxycodone /acetaminophen  5-3 25, 5 tablets as day since the last visit which is an increase from what he revealed before and he does not report worsening pain.  He also stated that he has an extra prescription available but he would like refills of the same.  Informed him that I would not refill his opiate medication and we can consider referral to pain doctors to discuss non-opiate pain medications.  He states he will provide a urine sample for toxicology at the next visit.    Reviewed laboratory test results with him, see separate note.  Will not start lisinopril .  Check BP readings at home send them to me for review in a month.         [1]   Patient Active Problem List  Diagnosis    Primary hypertension    Incisional hernia    Hyperlipidemia    Sensorineural hearing loss  AKI (acute kidney injury)    Back pain    Allergic rhinitis    Adenomatous colon polyp    Asthma-COPD overlap syndrome       Tobacco abuse    Chewing tobacco use    GERD without esophagitis    History of lung cancer    Diabetes mellitus type 2 without retinopathy Dry eye syndrome of bilateral lacrimal glands    Pseudophakia of both eyes    Myopia with astigmatism and presbyopia, bilateral    Stage 3a chronic kidney disease (CMS-HCC)    Tinnitus of both ears    Lung cancer       Radiation pneumonitis (HHS-HCC)    Aspiration pneumonitis       Acute cystitis with hematuria    Hyperkalemia    Benign prostatic hyperplasia with incomplete bladder emptying    Slow transit constipation    Renal lesion needs MRI    Sun-damaged skin    Trapezius strain   [2]   Current Outpatient Medications   Medication Sig Dispense Refill    MEDICAL SUPPLY ITEM Pt has DM hx of neuropathy w/ callus. Followed for comprehensive care. For med records, fax release to [phone]. 2 Units 0    metFORMIN  (GLUCOPHAGE ) 500 MG tablet TAKE 1 TABLET (500MG  TOTAL) BY MOUTH IN THE MORNING AND TAKE 1 TABLET IN THE EVENING WITH MEALS 180 tablet 3    ofloxacin (OCUFLOX) 0.3 % ophthalmic solution APPLY 2 DROPS (OPHTHALMIC (EYE)) 4 TIMES PER DAY FOR 5 DAYS      oxyCODONE -acetaminophen  (PERCOCET) 5-325 mg per tablet Take 1 tablet by mouth every four (4) hours as needed for pain. 150 tablet 0    tamsulosin  (FLOMAX ) 0.4 mg capsule Take 1 capsule (0.4 mg total) by mouth two (2) times a day. 180 capsule 3    TRELEGY ELLIPTA  200-62.5-25 mcg DsDv INHALE 1 PUFF IN THE MORNING 60 each 5    albuterol  HFA 90 mcg/actuation inhaler TAKE 2 PUFFS BY MOUTH EVERY 6 HOURS AS NEEDED FOR WHEEZE 8 g 5    atorvastatin  (LIPITOR ) 40 MG tablet TAKE 1 TABLET BY MOUTH EVERY DAY IN THE EVENING 90 tablet 3    blood sugar diagnostic (ACCU-CHEK GUIDE TEST STRIPS) Strp Use to check blood sugar three times daily as instructed by clinic. E11.9 300 strip 3    blood-glucose meter kit Disp. blood glucose meter kit preferred by patient's insurance. Dx: Diabetes, E11.9 1 each 11    dupilumab  (DUPIXENT ) 300 mg/2 mL pen injector Inject the contents of 1 pen (300 mg) under the skin every fourteen (14) days. 4 mL 11    empty container Misc Use as directed to dispose of Dupixent  pens 1 each 2    EPINEPHrine  (EPIPEN ) 0.3 mg/0.3 mL injection Inject 0.3 mL (0.3 mg total) into the muscle once as needed for anaphylaxis for up to 1 dose. 1 each 0    finasteride  (PROSCAR ) 5 mg tablet Take 1 tablet (5 mg total) by mouth daily. 90 tablet 3    fluticasone  propionate (FLONASE ) 50 mcg/actuation nasal spray 2 sprays into each nostril daily. 48 mL 7    ipratropium-albuterol  (DUO-NEB) 0.5-2.5 mg/3 mL nebulizer Inhale 1 vial ( 3 mL) by nebulization every six (6) hours. Use as directed 1080 mL 1    lancets Misc Disp. lancets #100 or amount allowed, Testing BID. Dx: E11.9,Z79.4 (DM Type 2- controlled, insulin  dep) 100 each 11    [Paused] lisinopril  (PRINIVIL ,ZESTRIL ) 10 MG tablet Take 1 tablet (10 mg  total) by mouth daily. 90 tablet 3    MEDICAL SUPPLY ITEM 1 pair of diabetic shoes and 3 pairs of inserts.  Pt has DM with neuropathy w/ callus. Followed for comprehensive care. For med records, fax release to [phone] 2 each 0    oxyCODONE -acetaminophen  (PERCOCET) 5-325 mg per tablet Take 1 tablet by mouth every four (4) hours as needed for pain. (Patient not taking: Reported on 10/08/2023) 150 tablet 0    sodium chloride  3 % NEBULIZER solution Inhale 1 vial ( 4 mL) by nebulization two (2) times a day. 720 mL 3     No current facility-administered medications for this visit.   [3]   Allergies  Allergen Reactions    Latex      Added based on information entered during case entry, please review and add reactions, type, and severity as needed

## 2023-10-17 NOTE — Unmapped (Signed)
 Spoke with Lee Baxter regarding his results showing stable elevated creatinine consistent with his CKD but also mild hyperkalemia and for now we will not restart the lisinopril  or nifedipine .  CBC shows resolution of anemia.  I advised him to check his blood pressure readings several times a week and send me the readings in a month.

## 2023-10-24 DIAGNOSIS — E1142 Type 2 diabetes mellitus with diabetic polyneuropathy: Principal | ICD-10-CM

## 2023-10-24 MED ORDER — METFORMIN 500 MG TABLET
ORAL_TABLET | 3 refills | 0.00000 days
Start: 2023-10-24 — End: ?

## 2023-10-25 MED ORDER — METFORMIN 500 MG TABLET
ORAL_TABLET | Freq: Two times a day (BID) | ORAL | 2 refills | 90.00000 days | Status: CP
Start: 2023-10-25 — End: 2024-10-07

## 2023-10-31 NOTE — Unmapped (Signed)
 [facility] Refill Coordination Note    Specialty Medication(s) to be Shipped:   CF/Pulmonary/Asthma: Dupixent     Other medication(s) to be shipped: No additional medications requested for fill at this time    Specialty Medications not needed at this time: N/A     Lee Baxter, DOB: 26-May-1945  Phone: [phone] (home) [phone] (work)      All above HIPAA information was verified with patient's family member, daughter.     Was a Nurse, learning disability used for this call? No    Completed refill call assessment today to schedule patient's medication shipment from the [facility]  [phone]).  All relevant notes have been reviewed.     Specialty medication(s) and dose(s) confirmed: Regimen is correct and unchanged.   Changes to medications: [name] reports no changes at this time.  Changes to insurance: No  New side effects reported not previously addressed with a pharmacist or physician: None reported  Questions for the pharmacist: No    Confirmed patient received a Conservation officer, [name], mining with first shipment. The patient will receive a drug information handout for each medication shipped and additional FDA Medication Guides as required.       DISEASE/MEDICATION-SPECIFIC INFORMATION        For patients on injectable medications: Next injection is scheduled for 08/31.    SPECIALTY MEDICATION ADHERENCE     Medication Adherence    Patient reported X missed doses in the last month: 0  Specialty Medication: DUPIXENT  PEN 300 mg/2 mL pen injector (dupilumab )  Patient is on additional specialty medications: No              Were doses missed due to medication being on hold? No    DUPIXENT  PEN 300 mg/2 mL pen injector (dupilumab ) : 0 doses of medicine on hand     REFERRAL TO PHARMACIST     Referral to the pharmacist: Not needed      SHIPPING     Shipping address confirmed in Epic.     Cost and Payment: Patient has a $0 copay, payment information is not required.    Delivery Scheduled: Yes, Expected medication delivery date: 08/27.     Medication will be delivered via UPS to the prescription address in Epic WAM.    Lee Baxter   [facility]  Specialty Technician

## 2023-11-06 MED FILL — DUPIXENT 300 MG/2 ML SUBCUTANEOUS PEN INJECTOR: SUBCUTANEOUS | 28 days supply | Qty: 4 | Fill #1

## 2023-11-14 NOTE — Unmapped (Signed)
 [place] [facility] ([facility]  SUCCESSFUL ENGAGEMENT      Lee Baxter is a 78 y.o. male identified as possibly having a proportion of days covered of less than 80% for a hyperlipidemia medication, based on payer reports and chart review. Contacted the daughter , [name] to review medication use history for the medication(s) described below.    Clinical Medication Adherence Assessment      Medication Assessed #1: ATORVASTATIN  40MG    Chronic Disease State: Hyperlipidemia   Source of Data/Request for Medication Adherence Assessment: Payer Report   Does patient indicate they are taking their medication as prescribed?: Yes   Are prescription instructions in line with the way patient is currently taking this medication?: Yes   Medication Refills Remaining?: Yes   Date of Last Refill: 07/01/23   Do you currently receive a 90-day supply of this medication?: Yes   In general do you have any trouble obtaining or affording your medications?: No   Does the patient have any perceived side effects from this medication?: No         What other reasons can you think of that may have caused missed doses or gaps in medication fill history?: None   Reasons for Non-Adherence: Other   Spoke to patients daughter, Jyl, who verified that her father is still taking Atorvastatin . She stated she will check with him to make sure he is getting it daily and if he is taking it at night will recommend he start taking it in the morning to help reduce the number of missed doses.   Actions taken for this medication: Education Provided, Adherence Aid Recommended      General Medication Adherence: :   Population: Advertising copywriter [facility])   Contact Source: Caregiver   Who helps you with your medications?: Manage Myself   What reminder methods or adherence aids do you currently use to take your medications?: Pillbox   Thinking about all of your medications, do you have any issues or concerns that we can further assist you with?: No      Would you be interested in discussing your medication concerns further through a scheduled virtual visit with a pharmacist?: No   Interventions Taken: None              Jayson Pouch , CPhT  Certified Pharmacy Technician  [place] [facility] ([facility])  Phone: [phone]

## 2023-11-20 NOTE — Unmapped (Incomplete)
 Pulmonary Clinic -follow-up visit    Referring Physician :  Pauline Lani Pane  PCP:     Manuela Sharrell Dotter, MD  Reason for Consult:   Advanced destructive emphysema/COPD    HISTORY:     History of Present Illness:  Mr. Zapien is a 78 y.o. male with a history of COPD, hypertension, prior history of left upper lobe adenocarcinoma status post partial lobectomy followed by right lower lobe non-small cell lung cancer not pathologically confirmed status post radiation in November 2023, complicated by radiation pneumonitis status post receiving steroids in February 2024, history of GERD and CKD as well, whom we are seeing in consultation requested by Abryanna Musolino Kishor Siona Coulston for evaluation of advanced obstructive emphysema/COPD.    Patient comes in here today with his daughter [name], to establish care.    Significant medical history: Does not recollect having asthma as a child.  Smoked up to a pack a day for 35 years, quit 25 years ago.  However thereafter started chewing tobacco and she was a pack over 2 days.  Denies any vaping or any other inhalational products.  He was diagnosed with left upper lobe adenocarcinoma in the 1990s and underwent partial lobectomy.  During follow-up, he was found to have 2 nodules, in the right lower lobe, which were mildly PET avid, underwent a transbronchial biopsy however was not diagnostic.  With this being very concerning for cancer, he underwent radiation in November 2023.  Thereafter he developed complication from radiation and radiation pneumonitis receiving steroids in the month of February, with a weekly taper of 10 mg starting at 40 mg.  The empiric radiation was with 1000 cGy for 5 fractions, completed 09 February 2022.  Radiation pneumonitis was diagnosed in February 2024 with his symptoms being shortness of breath cough and phlegm.  He started requiring oxygen thereafter and now uses 2 L of oxygen at rest on exertion and at nighttime.    He was admitted in April 2024, due to similar symptoms of cough and shortness of breath when pulmonary was consulted.  A pleural effusion was also noted on the right side which was drained, cultures were negative, cytology negative for cancer.  Bronchoscopy with BAL was also performed with negative cultures, negative Fungitell, negative BAL galactomannan and negative cytology for malignancy.    Pulmonary was asked if he required no steroids at that time and due to the fact that he had completed a steroid taper, it was thought not necessary.  He was treated with a COPD exacerbation with a short course of steroids and antibiotics.  He was also given 3% saline during the hospitalization for clearance of his airways as he had developed traction bronchiectasis in his right lower lobe from the radiation pneumonitis.    Currently, he is on Trelegy Ellipta , does 1 puff every day.  Is not on any chronic steroids or azithromycin .    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  Since our last visit, we have done a few things.  A 6-minute walk test was done which showed that patient required 2 to 3 L oxygen on exertion.  Arterial blood gas was done which did not show hypercapnia.  Pulmonary function testing showed moderate obstruction with some reversibility, with gas trapping, and a severely low DLCO. We also had started the patient on Dupixent  since her last visit.  Symptomatically, patient notes that he is doing a lot better than last time, very happy and cheerful, not on the wheelchair anymore and walked himself with a cane with his granddaughter.  He notes that the nebulizer treatment and the shots along with the inhaler have really helped his breathing symptoms and that he has clear mucus production now and not congested all the time.  He does not note any use of steroids or antibiotics in the last 3 months since my last visit.  The shortness of breath is still present however he says he is able to manage his activities better than before.  He also notes completing his pulmonary rehab however as his blood pressure increased he had to stop it.  He is getting his blood pressure managed better by his primary care doctor now.    Interval History 06/06/2023  -Continues to be on Trelegy and Dupixent .    -Patient is doing very well overall, is no longer using his oxygen during the day or nighttime.  Continues to use Trelegy once a day without any problems.  He feels that the Trelegy and the Dupixent  together have really changed his life and he is able to do a lot more and exert a lot more and does not get short of breath as he used to.  -Denies any fevers or chills or cough or shortness of breath.      Interval History 11/21/2023:  [facility] admission in May 2025 for Urinary retention/BPH and AKI and cystitis and HyperK,   - on trelegy   - on dupixent   - advised to restart nightly 2lpm O2  - CT chest in July 2025 -  resolution of RUL pneumonia seen in June 2025 - received augmentin  for this  -  next scan now in Jan 2026  - ***        Past Medical History:  Past Medical History:   Diagnosis Date    Adenomatous colon polyp     Asthma (HHS-HCC)     Cervical disc disorder 04/17/2013    cervical spine MRI from February 2014 discharge DJD and foraminal stenosis at C4- 5, C3- 4 and C6- 7.     Cervical stenosis of spinal canal 11/06/2019    COPD (chronic obstructive pulmonary disease)    (CMS-HCC)     Diabetes mellitus    (CMS-HCC) Dx 1990    Type II    Diverticulosis     Hemorrhoids, internal     HL (hearing loss)     Hypertension     Lung cancer    (CMS-HCC) 1992    Pleural effusion, right 06/25/2022    PVD (posterior vitreous detachment), left eye     Sinusitis     Temporomandibular joint disorders 01/15/2012     Past Surgical History:   Procedure Laterality Date    CATARACT EXTRACTION W/ INTRAOCULAR LENS IMPLANT Left 02/13/2006    Cleatus MD  CATARACT EXTRACTION W/ INTRAOCULAR LENS IMPLANT Right 03/27/2006    Cleatus MD    CHOLECYSTECTOMY      HERNIA REPAIR      KIDNEY STONE SURGERY      LUNG LOBECTOMY Left 1992    PR ALLOGRAFT FOR SPINE SURGERY ONLY MORSELIZED N/A 11/06/2019    Procedure: ALLOGRAFT FOR SPINE SURGERY ONLY; MORSELIZED;  Surgeon: Abram JONELLE Gartner, MD;  Location: [facility] OR [facility];  Service: Orthopedics    PR ARTHRD PST/PSTLAT TQ 1NTRSPC CRV BELW C2 SEGMENT N/A 11/06/2019    Procedure: ARTHRODESIS, POSTERIOR OR POSTEROLATERAL TECHNIQUE, SINGLE LEVEL; CERVICAL BELOW C2 SEGMENT;  Surgeon: Abram JONELLE Gartner, MD;  Location: [facility] OR [facility];  Service: Orthopedics    PR ARTHRODESIS PST/PSTLAT TQ 1NTRSPC EA ADDL NTRSPC N/A 11/06/2019    Procedure: ARTHRODESIS, POSTERIOR OR POSTEROLATERAL TECHNIQUE, SINGLE LEVEL; EACH ADDITIONAL VERTEBRAL SEGMENT   x4;  Surgeon: Abram JONELLE Gartner, MD;  Location: [facility] OR [facility];  Service: Orthopedics    PR AUTOGRAFT SPINE SURGERY LOCAL FROM SAME INCISION N/A 11/06/2019    Procedure: AUTOGRAFT/SPINE SURG ONLY (W/HARVEST GRAFT); LOCAL (EG, RIB/SPINOUS PROC, LAM FRGMT) OBTAIN FROM SAME INCIS;  Surgeon: Abram JONELLE Gartner, MD;  Location[facility];  Service: Orthopedics    PR BRNSCHSC TNDSC EBUS DX/TX INTERVENTION [facility] LES Right 11/21/2021    Procedure: BRONCH, RIGID OR FLEXIBLE, INCLUDING FLUORO GUIDANCE, WHEN PERFORMED; WITH TRANSENDOSCOPIC EBUS DURING BRONCHOSCOPIC DIAGNOSTIC OR THERAPEUTIC INTERVENTION(S) FOR PERIPHERAL LESION(S);  Surgeon: Selinda Janina Sermon, MD;  Location: MAIN OR [place];  Service: Pulmonary    PR BRONCHOSCOPY,COMPUTER ASSIST/IMAGE-GUIDED NAVIGATION N/A 11/21/2021    Procedure: ROBOT ION BRONCHOSCOPY,RIGID OR FLEXIBLE,INCLUDE FLUORO WHEN PERFORMED; W/COMPUTER-ASSIST,IMAGE-GUIDED NAVIGATION;  Surgeon: Selinda Janina Sermon, MD;  Location: MAIN OR [place];  Service: Pulmonary    PR BRONCHOSCOPY,COMPUTER ASSIST/IMAGE-GUIDED NAVIGATION N/A 11/21/2021    Procedure: BRONCHOSCOPY, RIGID OR FLEXIBLE, INCLUDE FLUORO WHEN PERFORMED; W/COMPUTER-ASSIST, IMAGE-GUIDED NAVIGATION;  Surgeon: Selinda Janina Sermon, MD;  Location: MAIN OR [place];  Service: Pulmonary    PR BRONCHOSCOPY,DIAGNOSTIC W LAVAGE Right 06/27/2022    Procedure: BRONCHOSCOPY, RIGID OR FLEXIBLE, INCLUDE FLUOROSCOPIC GUIDANCE WHEN PERFORMED; W/BRONCHIAL ALVEOLAR LAVAGE;  Surgeon: Blondie Ronal Anette Arlean, MD;  Location: MAIN OR [place];  Service: Pulmonary    PR BRONCHOSCOPY,PLACEMENT FIDUCIAL MARKERS, 1/MULT N/A 11/21/2021    Procedure: BRONCHOSCOPY, RIGID OR FLEXIBLE, FLOURO WHEN PERFORMED; PLACEMENT OF FIDUCIAL MARKERS, SINGLE OR MULTIPLE;  Surgeon: Selinda Janina Sermon, MD;  Location: MAIN OR [place];  Service: Pulmonary    PR BRONCHOSCOPY,TRANSBRON ASPIR BX Right 11/21/2021    Procedure: BRONCHOSCOPY, RIGID/FLEX, INCL FLUORO; W/TRANSBRONCH NDL ASPIRAT BX, TRACHEA, MAIN STEM &/OR LOBAR BRONCHUS;  Surgeon: Selinda Janina Sermon, MD;  Location: MAIN OR ;  Service: Pulmonary    PR BRONCHOSCOPY,TRANSBRONCH BIOPSY Right 11/21/2021    Procedure: BRONCHOSCOPY, RIGID/FLEXIBLE, INCLUDE FLUORO GUIDANCE WHEN PERFORMED; W/TRANSBRONCHIAL LUNG BX, SINGLE LOBE;  Surgeon: Selinda Janina Sermon, MD;  Location: MAIN OR ;  Service: Pulmonary    PR C-LAMINOPLASTY W/GRAFT/PLATE, 2 OR MORE N/A 11/06/2019    Procedure: LAMINOPLASTY, CERVICAL, DECOMPRESS SPINAL CORD, 2+ VERTEBRAL SEGMENTS; W/RECONSTRUCT POSTERIOR BONY ELEMENT;  Surgeon: Abram JONELLE Gartner, MD; Location: [facility] OR [facility];  Service: Orthopedics    PR COLONOSCOPY W/BIOPSY SINGLE/MULTIPLE N/A 07/05/2017    Procedure: COLONOSCOPY, FLEXIBLE, PROXIMAL TO SPLENIC FLEXURE; WITH BIOPSY, SINGLE OR MULTIPLE;  Surgeon: Luke Cory Notch, MD;  [facility];  Service: Gastroenterology    PR IONM 1 ON 1 IN OR W/ATTENDANCE EACH 15 MINUTES N/A 11/06/2019  Procedure: CONTINUOUS INTRAOPERATIVE NEUROPHYSIOLOGY MONITORING IN OR;  Surgeon: Abram JONELLE Gartner, MD;  Location: [facility] OR [facility];  Service: Orthopedics    PR LAM W/O FACETEC FORAMOT/DSKC 1/2 VRT SEG, CERVICAL N/A 11/06/2019    Procedure: LAMINECT W/EXPLOR WO FACETECT 1-2 VERTEB; CERV;  Surgeon: Abram JONELLE Gartner, MD;  Location: [facility] OR [facility];  Service: Orthopedics    PR POSTERIOR SEGMENTAL INSTRUMENTATION 3-6 VRT SEG N/A 11/06/2019    Procedure: POST SEGMT INSTRUM; 3 TO 6 VERTEB SEGMT CERVICAL;  Surgeon: Abram JONELLE Gartner, MD;  Location: [facility] OR [facility];  Service: Orthopedics    SINUS SURGERY         Other History:  The social history and family history were personally reviewed and updated in the patient's electronic medical record.    Family History   Problem Relation Age of Onset    Cancer Daughter     Strabismus Daughter     Hypertension Daughter     No Known Problems Mother     No Known Problems Father     No Known Problems Sister     No Known Problems Brother     No Known Problems Maternal Aunt     No Known Problems Maternal Uncle     No Known Problems Paternal Aunt     No Known Problems Paternal Uncle     No Known Problems Maternal Grandmother     No Known Problems Maternal Grandfather     No Known Problems Paternal Grandmother     No Known Problems Paternal Grandfather     Amblyopia Neg Hx     Blindness Neg Hx     Cataracts Neg Hx     Diabetes Neg Hx     Glaucoma Neg Hx     Macular degeneration Neg Hx     Retinal detachment Neg Hx     Stroke Neg Hx     Thyroid disease Neg Hx      Social History     Socioeconomic History    Marital status: Widowed    Number of children: 4    Years of education: 6    Highest education level: 6th grade   Tobacco Use    Smoking status: Former     Current packs/day: 0.00     Average packs/day: 1 pack/day for 35.0 years (35.0 ttl pk-yrs)     Types: Cigarettes     Start date: 02/19/1953     Quit date: 08/12/1987     Years since quitting: 36.2    Smokeless tobacco: Current     Types: Chew   Vaping Use    Vaping status: Never Used   Substance and Sexual Activity    Alcohol use: No     Alcohol/week: 0.0 standard drinks of alcohol    Drug use: No   Social History Narrative    Lives w/ two granddaughters (2 and 8 yrs old). Is a widower. Was married 53yrs. Used to work in Holiday representative. His daughter buys his groceries but he cooks and pays bills. Former smoker, smoked for 35 yrs, quit 20 yrs ago. Has been cheewing tobacco since.     Social Drivers of Psychologist, prison and probation services Strain: Low Risk  (07/31/2023)    Overall Financial Resource Strain (CARDIA)     Difficulty of Paying Living Expenses: Not hard at all   Food Insecurity: No Food Insecurity (07/31/2023)    Hunger Vital Sign     Worried About Running  Out of Food in the Last Year: Never true     Ran Out of Food in the Last Year: Never true   Transportation Needs: No Transportation Needs (07/31/2023)    PRAPARE - Therapist, art (Medical): No     Lack of Transportation (Non-Medical): No   Housing: Low Risk  (07/31/2023)    Housing     Within the past 12 months, have you ever stayed: outside, in a car, in a tent, in an overnight shelter, or temporarily in someone else's home (i.e. couch-surfing)?: No     Are you worried about losing your housing?: No       Home Medications:  Current Outpatient Medications on File Prior to Visit   Medication Sig Dispense Refill    albuterol  HFA 90 mcg/actuation inhaler TAKE 2 PUFFS BY MOUTH EVERY 6 HOURS AS NEEDED FOR WHEEZE 8 g 5    atorvastatin  (LIPITOR ) 40 MG tablet TAKE 1 TABLET BY MOUTH EVERY DAY IN THE EVENING 90 tablet 3    blood sugar diagnostic (ACCU-CHEK GUIDE TEST STRIPS) Strp Use to check blood sugar three times daily as instructed by clinic. E11.9 300 strip 3    blood-glucose meter kit Disp. blood glucose meter kit preferred by patient's insurance. Dx: Diabetes, E11.9 1 each 11    dupilumab  (DUPIXENT ) 300 mg/2 mL pen injector Inject the contents of 1 pen (300 mg) under the skin every fourteen (14) days. 4 mL 11    empty container Misc Use as directed to dispose of Dupixent  pens 1 each 2    EPINEPHrine  (EPIPEN ) 0.3 mg/0.3 mL injection Inject 0.3 mL (0.3 mg total) into the muscle once as needed for anaphylaxis for up to 1 dose. 1 each 0    finasteride  (PROSCAR ) 5 mg tablet Take 1 tablet (5 mg total) by mouth daily. 90 tablet 3    fluticasone  propionate (FLONASE ) 50 mcg/actuation nasal spray 2 sprays into each nostril daily. 48 mL 7    ipratropium-albuterol  (DUO-NEB) 0.5-2.5 mg/3 mL nebulizer Inhale 1 vial ( 3 mL) by nebulization every six (6) hours. Use as directed 1080 mL 1    lancets Misc Disp. lancets #100 or amount allowed, Testing BID. Dx: E11.9,Z79.4 (DM Type 2- controlled, insulin  dep) 100 each 11    MEDICAL SUPPLY ITEM Pt has DM hx of neuropathy w/ callus. Followed for comprehensive care. For med records, fax release to [phone]. 2 Units 0    MEDICAL SUPPLY ITEM 1 pair of diabetic shoes and 3 pairs of inserts.  Pt has DM with neuropathy w/ callus. Followed for comprehensive care. For med records, fax release to [phone] 2 each 0    metFORMIN  (GLUCOPHAGE ) 500 MG tablet Take 1 tablet (500 mg total) by mouth in the morning and 1 tablet (500 mg total) in the evening. Take with meals. 180 tablet 2    ofloxacin (OCUFLOX) 0.3 % ophthalmic solution APPLY 2 DROPS (OPHTHALMIC (EYE)) 4 TIMES PER DAY FOR 5 DAYS      oxyCODONE -acetaminophen  (PERCOCET) 5-325 mg per tablet Take 1 tablet by mouth every four (4) hours as needed for pain. 150 tablet 0    [EXPIRED] oxyCODONE -acetaminophen  (PERCOCET) 5-325 mg per tablet Take 1 tablet by mouth every four (4) hours as needed for pain. (Patient not taking: Reported on 10/08/2023) 150 tablet 0    sodium chloride  3 % NEBULIZER solution Inhale 1 vial ( 4 mL) by nebulization two (2) times a day. 720 mL 3  tamsulosin  (FLOMAX ) 0.4 mg capsule Take 1 capsule (0.4 mg total) by mouth two (2) times a day. 180 capsule 3    TRELEGY ELLIPTA  200-62.5-25 mcg DsDv INHALE 1 PUFF IN THE MORNING 60 each 5     No current facility-administered medications on file prior to visit.       Allergies:  Allergies as of 11/21/2023 - Reviewed 10/08/2023   Allergen Reaction Noted    Latex  06/26/2022       Review of Systems:  A comprehensive review of systems was completed and negative except as noted in HPI.    PHYSICAL EXAM:   There were no vitals taken for this visit.    ***    LABORATORY and RADIOLOGY DATA:     Pulmonary Function Tests/Interpretation:      7/5/024:            Pertinent Laboratory Data:     Latest Reference Range & Units 08/23/22 12:29   Aspergillus fumigatus IgE <0.35 kUA/L 1.26 (H)   Fusarium Proliferatum IgE <0.35 kUA/L 0.37 (H)   German Cockroach IgE <0.35 kUA/L 0.41 (H)   P chrysogenum (P notatum) IgE <0.35 kUA/L 0.53 (H)   Trichophyton rubrum IgE <0.35 kUA/L 2.69 (H)   Total IgE 3 - 48 IU/mL 787 (H)   A. fumigatus IgE <0.35 kU/L 1.22 (H)   (H): Data is abnormally high     Latest Reference Range & Units 08/23/22 12:29   Absolute Eosinophils 0.0 - 0.5 10*9/L 0.3        Latest Reference Range & Units 09/15/22 10:08   FIO2 Arterial  Room Air   pH, Arterial 7.35 - 7.45  7.45   pCO2, Arterial 35.0 - 45.0 mm Hg 37.2   pO2, Arterial 80.0 - 110.0 mm Hg 60.2 (L)   HCO3 Art 22 - 27 mmol/L 26   Base Excess, Arterial -2.0 - 2.0  1.6   O2 Sat, Arterial 94.0 - 100.0 % 92.1 (L)   Specimen Source  Arterial, left radial   (L): Data is abnormally low      Lab Results   Component Value Date    WBC 8.1 10/08/2023    HGB 13.6 10/08/2023    HCT 40.6 10/08/2023    PLT 211 10/08/2023       Lab Results   Component Value Date    NA 140 10/08/2023    K 5.3 (H) 10/08/2023    CL 107 10/08/2023    CO2 24.7 10/08/2023    BUN 14 10/08/2023    CREATININE 1.47 (H) 10/08/2023    GLU 135 10/08/2023    CALCIUM  9.4 10/08/2023    MG 1.9 07/31/2023    PHOS 3.4 07/30/2023       Lab Results   Component Value Date    BILITOT 0.4 07/30/2023    PROT 7.7 07/30/2023    ALBUMIN 3.8 07/30/2023    ALT 18 07/30/2023    AST 20 07/30/2023    ALKPHOS 95 07/30/2023    GGT 19 01/11/2012       Lab Results   Component Value Date    INR 1.07 10/20/2019    APTT 35.1 10/20/2019      Latest Reference Range & Units 06/30/20 20:40 09/30/21 11:57 04/19/22 11:58   Absolute Eosinophils 0.0 - 0.5 10*9/L 0.4 0.3 0.2     AFB smear and culture: 4/16-17/24: negative (pleural and bronchial)  Bronchial and pleural fluid culture: no growth  Fungal: negative  BAL GAL <0.5  Fungitell <31  RPP negative    A. Pleural fluid , cytology:  - Negative for malignant cells  - Reactive mesothelial cells, macrophages and chronic inflammation    BAL, right middle lobe, cytology:  - No cytologically malignant cells seen  - Abundant macrophages and mixed inflammatory cells    Pertinent Imaging Data:    CT chest 09/30/2023:  1.  Near complete resolution of right upper lobe pneumonia.   2.  Post left upper lobectomy with unchanged treated mass in the right lower lobe. No evidence of local disease recurrence or progressive thoracic metastatic disease.   3.  Unchanged 7 mm nodule in the apical right upper lobe. Recommend attention on follow-up per clinical protocol.   4.  Smoking-related lung disease with severe centrilobular emphysema.   5.  Extensive coronary artery calcifications.       CT chest 08/31/2203:  .  Right upper lobe pneumonia. Recommend follow-up CT chest in 4 to 6 weeks following treatment.   2.  Post left upper lobectomy with unchanged treated mass in the right lower lobe. No evidence of local disease recurrence or progressive thoracic metastatic disease.   3.  Trace right pleural effusion.   4.  Smoking-related lung disease with severe centrilobular emphysema.   5.  Extensive coronary artery calcifications.         CT chest 05/15/2023:  1. Anticipated changes associated radiation therapy in the lower lobe of the right lung without findings to suggest locally recurrent or new neoplastic disease within the thorax.      2. Improved, near resolution of a small focus of consolidation in the upper lobe of the right lung evident on the comparison exam from November 2024 (image 49 series 6)      3. Notable chronic findings include:   - Advanced obstructive changes of centrilobular emphysema   - Calcified atherosclerotic disease within the coronary arterial distribution (heavy)     CT chest 02/02/2023:  Post treatment changes of the right lower lobe without evidence of local recurrence.      New clustered nodularity in the anterior right upper lobe which can be infectious or inflammatory, however, follow-up CT chest in 4 to 6 weeks is recommended to assess change and ensure resolution.      Advanced destructive centrilobular emphysema.         CT chest 10/26/2022:  Impression      1. No findings to suggest progression of neoplastic disease involving the thorax      2. No findings of infectious pneumonia or other acute pulmonary process      3. Notable chronic findings include:   - Advanced destructive centrilobular emphysema   - Calcified atherosclerotic disease within the coronary arterial distribution         Echo 06/16/22: Summary    1. The left ventricle is relatively small in size with upper normal wall  thickness.    2. The left ventricular systolic function is normal, LVEF is visually  estimated at > 55%.    3. The right ventricle is normal in size, with normal systolic function.       CT chest: 07/25/22:   Impression      --Persistent but improved right middle and lower lobe airspace disease likely related to radiation therapy with nonvisualization of previously treated lesions. --Increased left lower lobe airspace disease which could be treatment related or secondary to superimposed infection.      --Interval decrease in size of right apical nodule.      --  Small right pleural effusion.         CTA 06/25/22:   Impression      * No acute pulmonary emboli.   * Increased right middle and right lower lobe airspace disease which could be treatment related or secondary to superimposed pneumonia or aspiration given airway debris.   * Scattered new irregular nodular opacities in both lungs are likely infectious/inflammatory in etiology with neoplasm considered less likely. Attention on follow-up imaging is recommended.   * Increased size of small right pleural effusion.   * Advanced destructive emphysema.       CT 04/25/22:   Impression      1. New extensive consolidation in the lower lobe of the right lung like reflects radiation therapy related pneumonitis. Underlying treated lesion cannot be differentiated from the surrounding consolidation.      2. New single irregular nodular lesions in the apices of both lungs likely relate to benign change. However, consider CT imaging surveillance in 2-3 months to ensure resolution.      3. Calcified atherosclerotic disease within the coronary arterial distribution (heavy)       CT 11/10/21: Extensive centrilobular emphysema and mild bronchial wall thickening consistent with COPD. Postsurgical changes of left upper lobectomy. Stable 2.9 cm and 2.5 cm groundglass opacities within the emphysema in the right lower lobe (series 4, image 49, 42). Scattered calcified granuloma in the right upper and right lower lobes. Central airways are patent. No pleural effusion or pneumothorax.         ASSESSMENT and PLAN     ZVI DUPLANTIS is a 78 y.o. male with history of COPD, hypertension, prior history of left upper lobe adenocarcinoma status post partial lobectomy followed by right lower lobe non-small cell lung cancer not pathologically confirmed status post radiation in November 2023, complicated by radiation pneumonitis status post receiving steroids in February 2024, history of GERD and CKD as well Is here for management and optimization of his COPD and radiation pneumonitis related bronchiectatic changes in the right lower lobe.    Overall, patient has advanced destructive emphysema on his CT scan, and clinically he has COPD Gold stage IIE,  He was on 2-3 L oxygen based on his formal 6-minute evaluation for daytime oxygen.  He also used the same amount of oxygen at nighttime.     Since last visit, patient notes that he is doing a lot better overall symptomatically  with Trelegy Ellipta  as well as the Dupixent  shots.    He has tolerated Dupixent  very well and has not noted any side effects from the same.  His daughter gives him the shot at home without any difficulty.    Since then, he does a lot of physical activity than he used to before, has stopped using oxygen during the day and nighttime.  Although he did try pulmonary rehab in the past but due to elevated blood pressures he could not complete it.    I have asked him to start using oxygen at nighttime 2 L given his severe COPD and low DLCO he would be hypoxemic at nighttime.  He wants to avoid doing an overnight oximetry at this stage.    I have also counseled him to start doing pulmonary rehab again.  Referral placed again.       Eosinophilic Asthma and COPD, advanced obstructive emphysema: Gold COPD 2E, along with traction bronchiectasis mostly in the right lower lobe in the setting of radiation pneumonitis and mucous plugging  -Continue Trelegy  Ellipta  - patient stopped doing airway clearance with DuoNeb followed by 3% saline twice a day - ok to hold for now  -Continue Dupixent  bimonthly.  -Genotype for alpha-1 antitrypsin - MM  -Overall, patient doing really well and therefore has had better ability to perform his day-to-day activities.  I will continue the same regimen as above and follow-up in 6 months.      Oxygen Requirement  -Patient is currently not on 2 L oxygen at rest, on exertion and at night.  I have asked him to restart his 2 L oxygen at nighttime and monitor his saturations during daytime on exertion.    Chronic NIV:  -No hypercapnia noted on ABG    Chronic steroid and macrolide requirement:  -Currently the patient does not require either, will reassess at next visit    Health Maintenance:    Most Recent Immunizations   Administered Date(s) Administered    COVID-19 VAC,BIVALENT(4YR UP),PFIZER 12/07/2020    COVID-19 VACC,MRNA,(PFIZER)(PF) 12/10/2019    COVID-19 VACCINE,MRNA(MODERNA)(PF) 07/12/2020    INFLUENZA IIV3 HIGH DOSE 29YRS+(FLUZONE) 01/22/2023    INFLUENZA QUAD ADJUVANTED 43YR UP(FLUAD) 11/26/2020    INFLUENZA QUAD HIGH DOSE 29YRS+(FLUZONE) 11/04/2018    INFLUENZA TIV (TRI) PF (IM)(HISTORICAL) 11/23/2009    Influenza Vaccine Quad(IM)6 MO-Adult(PF) 05/08/2022    Influenza Virus Vaccine, unspecified formulation 11/26/2019    PNEUMOCOCCAL POLYSACCHARIDE 23-VALENT 08/30/2012    Pneumococcal Conjugate 13-Valent 11/20/2013    Td (adult) unspecified formulation 07/17/2003    TdaP 02/23/2012    ZOSTAVAX - ZOSTER VACCINE, LIVE, SQ 02/19/2014       Smoking Cessation:  Patient is currently not smoking cigarettes but he is chewing tobacco for the last 30 years.  I counseled him that if it was only a matter of having something in his mouth as he did not want to do candy chewing because of diabetes, there are sugar-free candies available that he can suck on and try to quit chewing tobacco.  I told him that chewing tobacco is also very high risk for causing oral cancer, lung cancer as well as pancreatic and stomach cancer.    Pulmonary Rehab:  Will retry pulmonary rehab, order placed for [place].  Last time he could not complete due to elevated blood pressures which seem to be a lot better controlled now.     Lung Cancer Screening  The USPSTF recommends annual screening for lung cancer with low-dose computed tomography in adults ages 12 to 27 years who have a 20 pack-year smoking history and currently smoke or have quit within the past 15 years. Screening should be discontinued once a person has not smoked for 15 years or develops a health problem that substantially limits life expectancy or the ability or willingness to have curative lung surgery.    Mr.Bither does not meet criteria for lung cancer screening according to the USPSTF guidelines as he already has lung cancer and is treated for it and will require surveillance CT scans.  Next scan in Jan 2026..  Dr. Rosina Economy continues to follow his scans.        Diagnoses and all orders for this visit:    COPD, severe    (CMS-HCC)    Asthma-COPD overlap syndrome    (CMS-HCC)    Eosinophilic asthma (HHS-HCC)    Severe persistent asthma without complication    (CMS-HCC)            Patient is nebulized bronchodilator and steroids and it is needed for their bronchiectasis/COPD  Plan of care was discussed with the patient who acknowledged understanding and is in agreement.    Patient will return to clinic in 6 months or sooner if needed.    RR:Xlwjo Kishor Chaunce Winkels, Aleman, Sharrell Dotter, MD    ***          I am located on-site and the patient is located on-site for this visit.

## 2023-11-29 NOTE — Unmapped (Signed)
 [facility] Refill Coordination Note    Specialty Medication(s) to be Shipped:   CF/Pulmonary/Asthma: Dupixent     Other medication(s) to be shipped: No additional medications requested for fill at this time    Specialty Medications not needed at this time: N/A     Lee Baxter, DOB: Nov 29, 1945  Phone: [phone] (home) [phone] (work)      All above HIPAA information was verified with patient's family member, daughter.     Was a Nurse, learning disability used for this call? No    Completed refill call assessment today to schedule patient's medication shipment from the [facility] [place] [facility]  [phone]).  All relevant notes have been reviewed.     Specialty medication(s) and dose(s) confirmed: Regimen is correct and unchanged.   Changes to medications: Lee Baxter reports stopping the following medications: Blood pressure not sure name  Changes to insurance: No  New side effects reported not previously addressed with a pharmacist or physician: None reported  Questions for the pharmacist: No    Confirmed patient received a Conservation officer, [name], mining with first shipment. The patient will receive a drug information handout for each medication shipped and additional FDA Medication Guides as required.       DISEASE/MEDICATION-SPECIFIC INFORMATION        N/A    SPECIALTY MEDICATION ADHERENCE     Medication Adherence    Patient reported X missed doses in the last month: 1  Specialty Medication: DUPIXENT  PEN 300 mg/2 mL pen injector (dupilumab )  Patient is on additional specialty medications: No              Were doses missed due to medication being on hold? No    DUPIXENT  PEN 300 mg/2 mL pen injector (dupilumab ) : 0 days of medicine on hand     REFERRAL TO PHARMACIST     Referral to the pharmacist: Not needed      [facility]     Shipping address confirmed in Epic.     Cost and Payment: Patient has a $0 copay, payment information is not required.    Delivery Scheduled: Yes, Expected medication delivery date: 09/23.     Medication will be delivered via UPS to the prescription address in Epic WAM.    Lee Baxter   [place] [facility]  Specialty Technician

## 2023-11-30 MED ORDER — TRELEGY ELLIPTA 200 MCG-62.5 MCG-25 MCG POWDER FOR INHALATION
Freq: Every day | RESPIRATORY_TRACT | 5 refills | 60.00000 days | Status: CP
Start: 2023-11-30 — End: ?

## 2023-11-30 NOTE — Unmapped (Signed)
Please refill if appropriate.    Most recent clinic visit: Visit date not found    Next clinic visit: Visit date not found

## 2023-12-03 MED FILL — DUPIXENT 300 MG/2 ML SUBCUTANEOUS PEN INJECTOR: SUBCUTANEOUS | 28 days supply | Qty: 4 | Fill #2

## 2023-12-04 NOTE — Unmapped (Signed)
 [facility]  NO OUTREACH    Lee Baxter is a 78 y.o. male identified as possibly having proportion of days covered of less than 80%  for a hypertension medication, based on payer reports and chart review.    [place]  [employer]    After chart and pharmacy claims review regarding the medication(s) below, patient outreach was not needed due to the following reason(s):         lisinopril  10 mg: Medication discontinued per chart note dated 10/17/2023 by Sharrell Feinstein, MD              Jayson Pouch , CPhT  Certified Pharmacy Technician  [facility] ([facility])  Phone: [phone]

## 2023-12-28 NOTE — Unmapped (Signed)
 [facility] Refill Coordination Note    Specialty Medication(s) to be Shipped:   CF/Pulmonary/Asthma: Dupixent     Other medication(s) to be shipped: No additional medications requested for fill at this time    Specialty Medications not needed at this time: N/A     Lee Baxter, DOB: 09-06-1945  Phone: [phone] (home) [phone] (work)      All above HIPAA information was verified with patient's family member, daughter.     Was a Nurse, learning disability used for this call? No    Completed refill call assessment today to schedule patient's medication shipment from the [facility]  [phone]).  All relevant notes have been reviewed.     Specialty medication(s) and dose(s) confirmed: Regimen is correct and unchanged.   Changes to medications: Kailan reports no changes at this time.  Changes to insurance: No  New side effects reported not previously addressed with a pharmacist or physician: None reported  Questions for the pharmacist: No    Confirmed patient received a Conservation officer, [name], mining with first shipment. The patient will receive a drug information handout for each medication shipped and additional FDA Medication Guides as required.       DISEASE/MEDICATION-SPECIFIC INFORMATION        For patients on injectable medications: Next injection is scheduled for 10/26.    SPECIALTY MEDICATION ADHERENCE     Medication Adherence    Patient reported X missed doses in the last month: 0  Specialty Medication: DUPIXENT  PEN 300 mg/2 mL pen injector (dupilumab )  Patient is on additional specialty medications: No              Were doses missed due to medication being on hold? No    DUPIXENT  PEN 300 mg/2 mL pen injector (dupilumab ): 0 doses of medicine on hand     REFERRAL TO PHARMACIST     Referral to the pharmacist: Not needed      [facility]     Shipping address confirmed in Epic.     Cost and Payment: Patient has a $0 copay, payment information is not required.    Delivery Scheduled: Yes, Expected medication delivery date: 10/21.     Medication will be delivered via UPS to the prescription address in Epic WAM.    Lee Baxter   [place] [facility]  Specialty Technician

## 2023-12-31 MED FILL — DUPIXENT 300 MG/2 ML SUBCUTANEOUS PEN INJECTOR: SUBCUTANEOUS | 28 days supply | Qty: 4 | Fill #3

## 2024-01-11 ENCOUNTER — Encounter
Admit: 2024-01-11 | Discharge: 2024-01-11 | Payer: Medicare (Managed Care) | Attending: Internal Medicine | Primary: Internal Medicine

## 2024-01-11 DIAGNOSIS — E119 Type 2 diabetes mellitus without complications: Principal | ICD-10-CM

## 2024-01-11 DIAGNOSIS — N1831 Stage 3a chronic kidney disease (CMS-HCC): Principal | ICD-10-CM

## 2024-01-11 DIAGNOSIS — M542 Cervicalgia: Principal | ICD-10-CM

## 2024-01-11 DIAGNOSIS — R4189 Other symptoms and signs involving cognitive functions and awareness: Principal | ICD-10-CM

## 2024-01-11 DIAGNOSIS — I10 Essential (primary) hypertension: Principal | ICD-10-CM

## 2024-01-11 DIAGNOSIS — F439 Reaction to severe stress, unspecified: Principal | ICD-10-CM

## 2024-01-11 DIAGNOSIS — E538 Deficiency of other specified B group vitamins: Principal | ICD-10-CM

## 2024-01-11 LAB — TSH: THYROID STIMULATING HORMONE: 1.911 u[IU]/mL (ref 0.550–4.780)

## 2024-01-11 LAB — BASIC METABOLIC PANEL
ANION GAP: 15 mmol/L — ABNORMAL HIGH (ref 5–14)
BLOOD UREA NITROGEN: 16 mg/dL (ref 9–23)
BUN / CREAT RATIO: 11
CALCIUM: 9.6 mg/dL (ref 8.7–10.4)
CHLORIDE: 99 mmol/L (ref 98–107)
CO2: 26.4 mmol/L (ref 20.0–31.0)
CREATININE: 1.52 mg/dL — ABNORMAL HIGH (ref 0.73–1.18)
EGFR CKD-EPI (2021) MALE: 46 mL/min/1.73m2 — ABNORMAL LOW (ref >=60)
GLUCOSE RANDOM: 221 mg/dL — ABNORMAL HIGH (ref 70–179)
POTASSIUM: 4.8 mmol/L (ref 3.4–4.8)
SODIUM: 140 mmol/L (ref 135–145)

## 2024-01-11 LAB — HEMOGLOBIN A1C
ESTIMATED AVERAGE GLUCOSE: 151 mg/dL
HEMOGLOBIN A1C: 6.9 % — ABNORMAL HIGH (ref 4.8–5.6)

## 2024-01-11 LAB — VITAMIN B12: VITAMIN B-12: 268 pg/mL (ref 211–911)

## 2024-01-11 MED ORDER — VENLAFAXINE ER 37.5 MG CAPSULE,EXTENDED RELEASE 24 HR
ORAL_CAPSULE | Freq: Every day | ORAL | 0 refills | 90.00000 days | Status: CP
Start: 2024-01-11 — End: 2024-04-10

## 2024-01-11 MED ORDER — AMLODIPINE 2.5 MG TABLET
ORAL_TABLET | Freq: Every day | ORAL | 0 refills | 90.00000 days | Status: CP
Start: 2024-01-11 — End: 2024-04-10

## 2024-01-11 NOTE — Progress Notes (Signed)
 [facility]     Reason for visit: Follow up    Questions / Concerns that need to be addressed:       Omron BPs (complete if screening BP has a systolic  > 130 or diastolic > 80)  BP#1 160/86   BP#2 153/84  BP#3 164/82    Average BP 159/84  (please note this as a comment in vitals)     PTHomeBP           Dexcom or Libre CGM in use? If so, pull appropriate reporting through portal (Dexcom) or EPIC order [name]).    HCDM reviewed and updated in Epic:    We are working to make sure all of our patients??? wishes are updated in Epic and part of that is documenting a Environmental Health Practitioner for each patient  A Health Care Decision Maker is someone you choose who can make health care decisions for you if you are not able - who would you most want to do this for you????  is already up to date.    HCDM (patient stated preference): Lee Baxter - Daughter - [phone]    BPAs completed:  Influenza vaccine    Annual Screenings:     __________________________________________________________________________________________    SCREENINGS COMPLETED IN FLOWSHEETS      AUDIT       PHQ2       PHQ9          GAD7       COPD Assessment       Falls Risk

## 2024-01-12 NOTE — Progress Notes (Signed)
 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     His blood pressure has been elevated over the past week, with home BP readings reaching as high as 165/98 mmHg, lowest reading 152/90. He has been off his blood pressure medications for a while, since he had AKI and hyperkalemia, requiring hospitalization in May 2025, as discussed during the last visit.     He experiences headaches, which occur in the back of the neck.  Headaches can sometimes be in the front but no sinus congestion reported.  Does not report radiculopathy symptoms.  Has had previous cervical spine surgery on 11/06/2019.  He has not been taking any prescribed pain medication but uses Tylenol  occasionally, taking one pill at a time when the headache starts. Tylenol  eases the pain but does not completely relieve it.  He previously was on opiates but was misusing it, selling them for profit, according to his daughter who called me.  He voices frustration and was visibly upset that he cannot have the opiates.    No recent falls or difficulty walking. He continues to perform activities around the house.    His blood sugar was 111 mg/dL at home this morning, and it tends to rise when he consumes sweets, which he does frequently.     Today he voices that his memory continues to worsen, something he has noticed before.  He verbalizes he has been my patient for 40 years but perhaps at the most it has been 28 years.  Upon review of the chart, he has a previous diagnosis of B12 deficiency and peripheral neuropathy attributed to B12 deficiency and took injectable B12 for several years followed by oral B12 for several years but has not taking supplementation of B12 for at least 6 years.        Problem List[1]   Current Medications[2]  Allergies[3]     SOCIAL History: as above.  He is a widower and lives alone.  His daughter [name] checks in on him often as does his granddaughter.    PHYSICAL:  General: Well-developed, well-groomed and dressed gentleman who appears upset.  Vitals: Repeat BP 163/90 when retook it.  Vitals:    01/11/24 1013 01/11/24 1116   BP: 159/84 163/90   BP Site:  L Arm   BP Position: Sitting Sitting   BP Cuff Size:  Large   Pulse: 103 97   Resp: 16    Temp: 35.6 ??C (96 ??F)    TempSrc: Temporal    SpO2: 98%    Weight: 59.4 kg (131 lb)    Height: 165.1 cm (5' 5)       BP Readings from Last 3 Encounters:   01/11/24 163/90   10/08/23 147/89   08/31/23 117/60     Wt Readings from Last 3 Encounters:   01/11/24 59.4 kg (131 lb)   10/08/23 58.5 kg (129 lb)   08/31/23 57.6 kg (127 lb)       HEENT: Head without tenderness on palpation.  Conjunctivae normal, anicteric sclerae.  No photophobia elicited.    Chest: Less prominent bibasilar crackles, otherwise clear to auscultation.  No wheezes.  CV: Normal  S1 and S2, no S3 or murmur.  MSK: Tenderness to palpation at the insertion of the right trapezius muscles.  Has kyphotic posture with flexing of neck forward.  Rotation of neck normal.  Neurological exam (limited): Motor 5/5 bilateral upper extremity and lower extremity.  Walks normally.  Ext:  No pedal edema  Psychiatric: Appears upset, a bit irritable.  Insight seems limited.    DIAGNOSTIC TESTS:     10/08/2023  Sodium 140, potassium 5.3, BUN 14, creatinine 1.47, eGFR 48, glucose 135, calcium  9.4.  WBC 8.1 hemoglobin 13.6, hematocrit 40.6%, platelets 211,000.    11/24/2019, cervical spine x-rays  Impression   Interval C2-C7 posterior spinal fixation without acute adverse features.     Interval posterior spinal fixation extending from C2 to C7 with laminoplasty plate on the right at C4. Unchanged anterior cervical discectomy and fusion at C5-C7. No acute fracture. No perihardware lucency. Alignment is unchanged. No prevertebral soft tissue swelling. Visualized lung apices are clear.            ASSESSMENT AND PLANS:    Assessment & Plan  Hypertension  Blood pressure is elevated today at the office as well at home at 165/98 mmHg, with a low of 152 mmHg. Headache present, less likely due to hypertension as BP is not elevated enough to cause this problem.. Off antihypertensive medications due to recent hospitalization due to AKI and hyperkalemia.  -Start amlodipine  2.5 mg a day, prescription sent to pharmacy  -Because of prior hyperkalemia on ACE inhibitor and history of CKD, and despite history of diabetes, will not offer ACE inhibitor or ARB.  - Check blood pressure at home, would tolerate blood pressure up to 140/90 given comorbidities.    Chronic neck and back pain  Intermittent neck and back pain.  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  Has muscle strain/cervicalgia component on exam.  However, given history of lung carcinoma, need to rule out metastases.  Tylenol  provides partial relief. 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  He has not done well with low-dose duloxetine  in the past due to side effects so we will avoid that.    -Ordered cervical spine x-rays to rule out metastases and worsening DJD and patient agrees to this test  - Prescribed venlafaxine XR 37.5 mg daily for pain management.  -Patient was again unable to provide urine for toxicology screen and opiate confirmation as he said he forgot and went to the bathroom just after he checked in at our office and before he was triaged by the nurse.  Advised him that without this test we cannot even consider reinstating the opiates (that I would not prescribe again).  - Pending above results, consider return to PT that he declines today or a referral to spine clinic.    Type 2 diabetes mellitus  Seems to be well-controlled.  Blood sugar level at 111 mg/dL this morning. Blood sugar fluctuates with dietary intake, particularly sweets.  -Check hemoglobin A1c  - Continue metformin  500 mg twice a day, atorvastatin  40 mg a day  - Foot exam due May 2026, urine albumin creatinine ratio due February 2026.    Concern regarding memory  Possibly age-related but need to rule out vitamin B-12 deficiency given prior history of such and not receiving supplementation for several  years and rule out thyroid disease.  -Ordered vitamin B12 level, TSH  -Consider brain imaging in the future    History of opiate misuse  See above.     General Health  -Provided flu vaccination today  -Patient declines COVID booster or PCV 21 that he is due for.  Last PCV 13 in 2015, Pneumovax 2014, Tdap 2013, overdue for Tdap booster that he can obtain at the local pharmacy.  -Also advised that he can obtain Shingrix  vaccine at the local pharmacy.    Follow-up with me in 6 weeks or as needed.    Addendum: Hemoglobin A1c 6.9%,Vitamin B12 268, low normal, TSH 1.911, normal.  Sodium 140, potassium 4.8, chloride 99, CO2 26.4, BUN 16, creatinine 1.52, nonfasting glucose 221 (patient states he drank orange juice just prior to coming to the office), calcium  9.6.    Patient did not obtain cervical spine x-rays and will be asked to obtain them at his convenience.    Diabetes remains well-controlled, added methylmalonic acid to assess B12 storage levels, potassium back to normal, CKD with elevated but stable creatinine.  Other results normal or acceptable.    Addendum 2 (01/12/2024, 12:33 PM): Spoke with the patient regarding the above test results and asked him to obtain the cervical spine x-rays at his convenience and he prefers to do it when he returns to the office.  Asked him to restart the vitamin B-12 1000 mcg pill for now while we await the methylmalonic acid test result.  Also reminded him to pick up his amlodipine  and venlafaxine pills at the pharmacy that he had not remembered to do.    This note was dictated using voice recognition software.  Please forgive typos.          [1]   Patient Active Problem List  Diagnosis    Primary hypertension    Incisional hernia    Hyperlipidemia    Sensorineural hearing loss    AKI (acute kidney injury)    Back pain    Allergic rhinitis    Adenomatous colon polyp    Asthma-COPD overlap syndrome    (CMS-HCC)    Tobacco abuse    Chewing tobacco use    GERD without esophagitis    History of lung cancer    Diabetes mellitus type 2 without retinopathy (CMS-HCC)    Dry eye syndrome of bilateral lacrimal glands    Pseudophakia of both eyes    Myopia with astigmatism and presbyopia, bilateral    Stage 3a chronic kidney disease (CMS-HCC)    Tinnitus of both ears    Lung cancer    (CMS-HCC)    Radiation pneumonitis (HHS-HCC)    Aspiration pneumonitis    (CMS-HCC)    Acute cystitis with hematuria    Hyperkalemia    Benign prostatic hyperplasia with incomplete bladder emptying    Slow transit constipation    Renal lesion needs MRI    Sun-damaged skin    Trapezius strain    Opiate misuse    Cervicalgia   [2]   Current Outpatient Medications   Medication Sig Dispense Refill    albuterol  HFA 90 mcg/actuation inhaler TAKE 2 PUFFS BY MOUTH EVERY 6 HOURS AS NEEDED FOR WHEEZE 8 g 5    atorvastatin  (LIPITOR ) 40 MG tablet TAKE 1 TABLET BY MOUTH EVERY DAY IN THE EVENING 90 tablet 3    blood sugar diagnostic (ACCU-CHEK GUIDE TEST STRIPS) Strp Use to check blood sugar three times daily as instructed by clinic. E11.9  300 strip 3    blood-glucose meter kit Disp. blood glucose meter kit preferred by patient's insurance. Dx: Diabetes, E11.9 1 each 11    dupilumab  (DUPIXENT ) 300 mg/2 mL pen injector Inject the contents of 1 pen (300 mg) under the skin every fourteen (14) days. 4 mL 11    empty container Misc Use as directed to dispose of Dupixent  pens 1 each 2    EPINEPHrine  (EPIPEN ) 0.3 mg/0.3 mL injection Inject 0.3 mL (0.3 mg total) into the muscle once as needed for anaphylaxis for up to 1 dose. 1 each 0    finasteride  (PROSCAR ) 5 mg tablet Take 1 tablet (5 mg total) by mouth daily. 90 tablet 3    fluticasone  propionate (FLONASE ) 50 mcg/actuation nasal spray 2 sprays into each nostril daily. 48 mL 7    ipratropium-albuterol  (DUO-NEB) 0.5-2.5 mg/3 mL nebulizer Inhale 1 vial ( 3 mL) by nebulization every six (6) hours. Use as directed 1080 mL 1    lancets Misc Disp. lancets #100 or amount allowed, Testing BID. Dx: E11.9,Z79.4 (DM Type 2- controlled, insulin  dep) 100 each 11    MEDICAL SUPPLY ITEM Pt has DM hx of neuropathy w/ callus. Followed for comprehensive care. For med records, fax release to [phone]. 2 Units 0    MEDICAL SUPPLY ITEM 1 pair of diabetic shoes and 3 pairs of inserts.  Pt has DM with neuropathy w/ callus. Followed for comprehensive care. For med records, fax release to [phone] 2 each 0    metFORMIN  (GLUCOPHAGE ) 500 MG tablet Take 1 tablet (500 mg total) by mouth in the morning and 1 tablet (500 mg total) in the evening. Take with meals. 180 tablet 2    ofloxacin (OCUFLOX) 0.3 % ophthalmic solution APPLY 2 DROPS (OPHTHALMIC (EYE)) 4 TIMES PER DAY FOR 5 DAYS      sodium chloride  3 % NEBULIZER solution Inhale 1 vial ( 4 mL) by nebulization two (2) times a day. 720 mL 3    tamsulosin  (FLOMAX ) 0.4 mg capsule Take 1 capsule (0.4 mg total) by mouth two (2) times a day. 180 capsule 3    TRELEGY ELLIPTA  200-62.5-25 mcg inhaler INHALE 1 PUFF IN THE MORNING 60 each 5    amlodipine  (NORVASC ) 2.5 MG tablet Take 1 tablet (2.5 mg total) by mouth daily. 90 tablet 0    venlafaxine (EFFEXOR XR) 37.5 MG 24 hr capsule Take 1 capsule (37.5 mg total) by mouth daily. 90 capsule 0     No current facility-administered medications for this visit.   [3]   Allergies  Allergen Reactions    Latex      Added based on information entered during case entry, please review and add reactions, type, and severity as needed

## 2024-01-22 LAB — METHYLMALONIC ACID, SERUM: METHYLMALONIC ACID: 0.62 nmol/mL — ABNORMAL HIGH

## 2024-01-25 NOTE — Progress Notes (Signed)
 [facility] Refill Coordination Note    Specialty Medication(s) to be Shipped:   CF/Pulmonary/Asthma: Dupixent     Other medication(s) to be shipped: No additional medications requested for fill at this time    Specialty Medications not needed at this time: N/A     Lee Baxter, DOB: Jan 16, 1946  Phone: [phone] (home) [phone] (work)      All above HIPAA information was verified with patient's family member, daughter.     Was a nurse, learning disability used for this call? No    Completed refill call assessment today to schedule patient's medication shipment from the [facility]  [phone]).  All relevant notes have been reviewed.     Specialty medication(s) and dose(s) confirmed: Regimen is correct and unchanged.   Changes to medications: Renaldo reports no changes at this time.  Changes to insurance: No  New side effects reported not previously addressed with a pharmacist or physician: None reported  Questions for the pharmacist: No    Confirmed patient received a Conservation Officer, [name], [name] with first shipment. The patient will receive a drug information handout for each medication shipped and additional FDA Medication Guides as required.       DISEASE/MEDICATION-SPECIFIC INFORMATION        For patients on injectable medications: Next injection is scheduled for 11/23.    SPECIALTY MEDICATION ADHERENCE     Medication Adherence    Patient reported X missed doses in the last month: 0  Specialty Medication: DUPIXENT  PEN 300 mg/2 mL pen injector (dupilumab )  Patient is on additional specialty medications: No              Were doses missed due to medication being on hold? No    DUPIXENT  PEN 300 mg/2 mL pen injector (dupilumab ) : 0 doses of medicine on hand     REFERRAL TO PHARMACIST     Referral to the pharmacist: Not needed      SHIPPING     Shipping address confirmed in Epic.     Cost and Payment: Patient has a $0 copay, payment information is not required.    Delivery Scheduled: Yes, Expected medication delivery date: 11/21.     Medication will be delivered via UPS to the prescription address in Epic WAM.    Deloria Brassfield   [facility]  Specialty Technician

## 2024-01-31 MED FILL — DUPIXENT 300 MG/2 ML SUBCUTANEOUS PEN INJECTOR: SUBCUTANEOUS | 28 days supply | Qty: 4 | Fill #4

## 2024-02-26 NOTE — Progress Notes (Unsigned)
 Radiation Oncology Follow Up Visit Note    Patient Name: Lee Baxter  Patient Age: 79 y.o.  Encounter Date: 03/21/2024    Referring Physician:   Manuela Sharrell LABOR, MD  [address]  [address]  [place],  [place] [address]    Primary Care Provider:  Manuela Sharrell LABOR, MD    Diagnoses:  1. Malignant neoplasm of lower lobe of right lung (CMS-HCC)      Treatment Site: TWO RLL lesions, 1000cGy x 5 fractions = 5000cGy, completed 01/23/22    Interval Since Completion of Treatment: 2 years and 2 months      Assessment: 78 y.o. male with radiographic diagnosis of synchronous RLL NSCLC 2.9 and 2.5 cm lesions without pathologic confirmation (nondiagnostic) but in the setting of significant smoking history    CT Chest today was personally reviewed and demonstrates ***    Plan:  ***     Interval History:  Mr. Torre returns today for a regularly scheduled follow-up, he was last seen in clinic 6 months ago.      He has a history of tobacco use, specifically chewing tobacco, and is concerned about its potential contribution to the inflammation observed on the scan. He quit smoking cigarettes forty years ago. He feels 'run down' and has difficulty tolerating temperature extremes, which affects his ability to assist with outdoor activities.    He uses a Trelegy inhaler for chronic obstructive pulmonary disease (COPD) and occasionally requires supplemental oxygen at night or when necessary. No recent fevers, chills, or cough, and he does not suspect pneumonia.    His appetite is decreased, but he maintains nutritional intake with Boost nutritional drinks, preferring the chocolate flavor. He consumes one to two drinks daily to prevent weight loss.    He experiences chronic back and neck pain, managed with pain medication as needed, especially at night. He has a history of back problems and has hardware in his neck.          Review of Systems: All other systems reviewed are negative. Pertinent positives and negatives are above in interval history.    Past Medical, Surgical, Family and Social Histories reviewed and updated in the electronic medical record.    Hematology/Oncology History   Lung cancer    (CMS-HCC)   12/15/2021 -  Cancer Staged    Staging form: Lung, AJCC 8th Edition  - Clinical stage from 12/15/2021: cT1, cN0, cM0 - Signed by Beauty Karilyn SAILOR, PA on 05/01/2023       01/17/2022 Initial Diagnosis    Lung cancer (CMS-HCC)           Physical Exam:  Vital Signs for this encounter:  There were no vitals taken for this visit.  Last weight:    Wt Readings from Last 4 Encounters:   01/11/24 59.4 kg (131 lb)   10/08/23 58.5 kg (129 lb)   08/31/23 57.6 kg (127 lb)   08/13/23 57.1 kg (125 lb 12.8 oz)     Karnofsky/Lansky Performance Status: 70, Cares for self; unable to carry on normal activity or to do active work (ECOG equivalent 1)  General:   No acute distress, alert and oriented X 4   HEENT:  Normocephalic and atraumatic.  Neck/lymphatics:  Supple.  Respiratory:  Breathing is non-labored.  Extremities:  No cyanosis or edema.  Neuro:   Alert and oriented x4.         Radiology  ***    Karilyn Beauty, PA-C  Department of Radiation  Oncology  [facility]  [id number]  [place], [place] [address]  O: [phone]  02/26/24 3:23 PM

## 2024-02-26 NOTE — Progress Notes (Signed)
 [facility] Refill Coordination Note    Specialty Medication(s) to be Shipped:   CF/Pulmonary/Asthma: Dupixent     Other medication(s) to be shipped: No additional medications requested for fill at this time    Specialty Medications not needed at this time: N/A     Lee Baxter, DOB: 12/04/1945  Phone: [phone] (home) [phone] (work)      All above HIPAA information was verified with patient's family member, [name] .     Was a nurse, learning disability used for this call? No    Completed refill call assessment today to schedule patient's medication shipment from the [facility]  [phone]).  All relevant notes have been reviewed.     Specialty medication(s) and dose(s) confirmed: Regimen is correct and unchanged.   Changes to medications: Von reports no changes at this time.  Changes to insurance: No  New side effects reported not previously addressed with a pharmacist or physician: None reported  Questions for the pharmacist: No    Confirmed patient received a Conservation Officer, [name], [name] with first shipment. The patient will receive a drug information handout for each medication shipped and additional FDA Medication Guides as required.       DISEASE/MEDICATION-SPECIFIC INFORMATION        N/A    SPECIALTY MEDICATION ADHERENCE     Medication Adherence    Patient reported X missed doses in the last month: 0  Specialty Medication: DUPIXENT  PEN 300 mg/2 mL pen injector (dupilumab )  Patient is on additional specialty medications: No  Patient is on more than two specialty medications: No  Any gaps in refill history greater than 2 weeks in the last 3 months: no  Demonstrates understanding of importance of adherence: yes              Were doses missed due to medication being on hold? No    dupilumab : DUPIXENT  PEN 300 mg/2 mL pen injector     : 1 doses of medicine on hand        Specialty medication is an injection or given on a cycle: Yes, Next injection is scheduled for 02/26/24 .    REFERRAL TO PHARMACIST     Referral to the pharmacist: Not needed      [facility]     Shipping address confirmed in Epic.     Cost and Payment: Patient has a $0 copay, payment information is not required.    Delivery Scheduled: Yes, Expected medication delivery date: 03/04/24 .     Medication will be delivered via UPS to the prescription address in Epic WAM.    Tawni Daring   [facility]  Specialty Technician

## 2024-03-03 MED FILL — DUPIXENT 300 MG/2 ML SUBCUTANEOUS PEN INJECTOR: SUBCUTANEOUS | 28 days supply | Qty: 4 | Fill #5

## 2024-03-04 NOTE — Telephone Encounter (Signed)
 Daughter to call back with updated insurance

## 2024-03-17 DIAGNOSIS — E538 Deficiency of other specified B group vitamins: Principal | ICD-10-CM

## 2024-03-17 DIAGNOSIS — N1831 Stage 3a chronic kidney disease (CMS-HCC): Principal | ICD-10-CM

## 2024-03-17 DIAGNOSIS — Z23 Encounter for immunization: Principal | ICD-10-CM

## 2024-03-17 DIAGNOSIS — E1142 Type 2 diabetes mellitus with diabetic polyneuropathy: Principal | ICD-10-CM

## 2024-03-17 DIAGNOSIS — I10 Essential (primary) hypertension: Principal | ICD-10-CM

## 2024-03-17 MED ADMIN — cyanocobalamin (vitamin B-12) injection 1,000 mcg: 1000 ug | INTRAMUSCULAR | @ 14:00:00 | Stop: 2024-03-17

## 2024-03-17 NOTE — Progress Notes (Signed)
 [facility] at [facility]     Reason for visit: Follow up          Omron BPs (complete if screening BP has a systolic  > 130 or diastolic > 80)  BP#1 147/81 101    BP#2 147/82 101   BP#3 137/77 99    Average BP 144/80 101  (please note this as a comment in vitals)     PTHomeBP     HCDM reviewed and updated in Epic:    We are working to make sure all of our patients??? wishes are updated in Epic and part of that is documenting a Environmental Health Practitioner for each patient  A Health Care Decision Maker is someone you choose who can make health care decisions for you if you are not able - who would you most want to do this for you????  is already up to date.    HCDM (patient stated preference): Lee Baxter - Daughter - [phone]

## 2024-03-17 NOTE — Patient Instructions (Signed)
 Your blood pressure is well-controlled with the amlodipine  2.5 mg pill that she continue taking.  You can continue taking your vitamin B12 pill 1000 mcg (1 mg) every day to treat your low B12 level.  This can help with your balance.  We provided you with the pneumonia, PCV 21 vaccine today.  You can go to your pharmacy and obtain the shingles vaccine and the RSV vaccine in 2 or more weeks.  I sent in the prescriptions.

## 2024-03-18 NOTE — Progress Notes (Signed)
 HISTORY:Lee Baxter is an 79 year old gentleman with a history of left upper lobe adenocarcinoma lung malignancy s/p partial lobectomy and years later right lower lobe non-small cell lung cancer, and not pathologically confirmed status post radiation in 2023 with resultant radiation pneumonitis, diabetes, hypertension, stage 3a CKD, asthma-COPD overlap and opioid misuse who presents for follow-up.     Mr. Enriquez stated he has been taking the amlodipine  2.5 mg a day that was prescribed at his last visit on 01/11/2024.  He recalls home BP readings that have slightly improved 140s-150s/80s-91, but no records are brought to the office.  Currently without any chest pain, shortness of breath or leg swelling.    At the last visit we prescribed venlafaxine  XR 37.5 for his chronic pain affecting his cervical spine but he states he did not want to start it and also did not obtain the cervical spine x-rays ordered then.  He finds that acetaminophen , dose unclear, helps reduce his pain and he is comfortable with that.    Since last visit he started taking his oral B12 1000 mcg daily due to low normal B12 and his problems with balance.  His methylmalonic acid level was 0.62, elevated, confirming B12 deficiency.  He has not had any change in his chronic swimmy headedness and he drinks a good amount of bottled water  every day.  No report of any falls.    Problem List[1]   Current Medications[2]  Allergies[3]     SOCIAL History: as above.  See previous notes.    PHYSICAL:  General: Well-developed, well-nourished gentleman who is in no acute distress but appears upset.  Vitals: Blood pressure 107/67 when I repeated it  Vitals:    03/17/24 0825 03/17/24 0847   BP: 144/80 107/67   BP Site: L Arm L Arm   BP Position: Sitting Sitting   BP Cuff Size: Medium Large   Pulse: 110 102   Temp: 36.7 ??C (98 ??F)    TempSrc: Temporal    SpO2: 96%    Weight: 58.1 kg (128 lb)    Height: 165.1 cm (5' 5)       BP Readings from Last 3 Encounters: 03/17/24 107/67   01/11/24 163/90   10/08/23 147/89     Wt Readings from Last 3 Encounters:   03/17/24 58.1 kg (128 lb)   01/11/24 59.4 kg (131 lb)   10/08/23 58.5 kg (129 lb)       Neurological (limited): Negative Romberg, walks normally, pivots well.  Stride normal.  Ext:  No pedal edema    DIAGNOSTIC TESTS:     01/11/2024 B12 268,methylmalonic acid 0.62,A1c 6.9%, TSH 1.911, sodium 140, potassium 4.8, BUN 16, creatinine 1.52 glucose nonfasting 221.        ASSESSMENT AND PLANS:    -- 1.  Primary hypertension, today's BP at the office is slightly low but patient asymptomatic with home BP readings that seem higher.  Recent electrolytes normal and creatinine elevated but stable.  Because of prior hyperkalemia on ACE inhibitor and history of CKD, and despite history of diabetes, will not offer ACE inhibitor or ARB.   - Continue amlodipine  2.5 mg a day.    --2.  CKD stage 3a  Most likely from diabetes and hypertension.  Creatinine elevated but stable on latest evaluation no signs of fluid overload..    --3.  Low normal serum B12 with low methylmalonic acid confirming B12 deficiency.  Most likely this is affecting his balance but today it seems  to be improved.  Recommended injectable B12 that he declines.  -- Continue B12 1000 mcg by mouth every day    --4.  Chronic neck and back pain  Patient states acetaminophen  controls his pain which will continue.  He has declined obtaining cervical spine x-rays to address worsening DJD versus any signs of metastases from prior lung cancer.  He has declined starting venlafaxine  prescribed at last visit.  He declined referral to PT or spine clinic at last visit.  Previous short-term use of duloxetine  was discontinued by the patient due to side effects.  See previous notes, we have discontinued opiate prescriptions due to opiate misuse and diversion.  -- If symptoms worsen, we will reevaluate and recommend imaging  -- Consider referral to pain clinic, as needed.    --5.  Type 2 diabetes mellitus with peripheral neuropathy  Well-controlled with last A1c 6.9% 01/11/2024.  - Continue metformin  500 mg twice a day.  - Prescription for diabetic shoes and inserts completed, printed and handed to patient.    --6.  History of RLL small cell lung carcinoma, not pathologically confirmed, status post RT  Did not address this issue today but patient asymptomatic.  - He will obtain his CT chest on 03/21/2024 and follow-up with his radiation oncologist that same day.    --7.  Concern regarding memory  Most likely due to chronic B12 deficiency that has been intermittently treated and treatment has been restarted at last visit.  May have microvascular disease due to hypertension and diabetes.  -- Will monitor clinically, consider brain imaging in the future.    --8.  General health  -- Provided PCV 21 vaccine today.  --Patient has declined COVID booster.  -- Last Tdap 2013, overdue for Tdap that he can obtain at the local pharmacy, as well as the RSV and Shingrix  vaccines.    Follow-up with me in 6 months or as needed.    This note was dictated using voice recognition software.  Please forgive typos.          [1]   Patient Active Problem List  Diagnosis    Primary hypertension    Incisional hernia    Hyperlipidemia    Sensorineural hearing loss    Back pain    Allergic rhinitis    Adenomatous colon polyp    Asthma-COPD overlap syndrome    (CMS-HCC)    Tobacco abuse    Chewing tobacco use    GERD without esophagitis    History of lung cancer    Diabetes mellitus type 2 without retinopathy (CMS-HCC)    Dry eye syndrome of bilateral lacrimal glands    Pseudophakia of both eyes    Myopia with astigmatism and presbyopia, bilateral    Stage 3a chronic kidney disease (CMS-HCC)    Tinnitus of both ears    Lung cancer    (CMS-HCC)    Radiation pneumonitis (HHS-HCC)    Benign prostatic hyperplasia with incomplete bladder emptying    Slow transit constipation    Renal lesion needs MRI    Sun-damaged skin    Trapezius strain    Opiate misuse    Cervicalgia   [2]   Current Outpatient Medications   Medication Sig Dispense Refill    albuterol  HFA 90 mcg/actuation inhaler TAKE 2 PUFFS BY MOUTH EVERY 6 HOURS AS NEEDED FOR WHEEZE 8 g 5    amlodipine  (NORVASC ) 2.5 MG tablet Take 1 tablet (2.5 mg total) by mouth daily. 90 tablet 0  atorvastatin  (LIPITOR ) 40 MG tablet TAKE 1 TABLET BY MOUTH EVERY DAY IN THE EVENING 90 tablet 3    blood sugar diagnostic (ACCU-CHEK GUIDE TEST STRIPS) Strp Use to check blood sugar three times daily as instructed by clinic. E11.9 300 strip 3    blood-glucose meter kit Disp. blood glucose meter kit preferred by patient's insurance. Dx: Diabetes, E11.9 1 each 11    dupilumab  (DUPIXENT ) 300 mg/2 mL pen injector Inject the contents of 1 pen (300 mg) under the skin every fourteen (14) days. 4 mL 11    empty container Misc Use as directed to dispose of Dupixent  pens 1 each 2    EPINEPHrine  (EPIPEN ) 0.3 mg/0.3 mL injection Inject 0.3 mL (0.3 mg total) into the muscle once as needed for anaphylaxis for up to 1 dose. 1 each 0    finasteride  (PROSCAR ) 5 mg tablet Take 1 tablet (5 mg total) by mouth daily. 90 tablet 3    fluticasone  propionate (FLONASE ) 50 mcg/actuation nasal spray 2 sprays into each nostril daily. 48 mL 7    ipratropium-albuterol  (DUO-NEB) 0.5-2.5 mg/3 mL nebulizer Inhale 1 vial ( 3 mL) by nebulization every six (6) hours. Use as directed 1080 mL 1    lancets Misc Disp. lancets #100 or amount allowed, Testing BID. Dx: E11.9,Z79.4 (DM Type 2- controlled, insulin  dep) 100 each 11    MEDICAL SUPPLY ITEM Pt has DM hx of neuropathy w/ callus. Followed for comprehensive care. For med records, fax release to [phone]. 2 Units 0    MEDICAL SUPPLY ITEM 1 pair of diabetic shoes and 3 pairs of inserts.  Pt has DM with neuropathy w/ callus. Followed for comprehensive care. For med records, fax release to [phone] 2 each 0    metFORMIN  (GLUCOPHAGE ) 500 MG tablet Take 1 tablet (500 mg total) by mouth in the morning and 1 tablet (500 mg total) in the evening. Take with meals. 180 tablet 2    ofloxacin (OCUFLOX) 0.3 % ophthalmic solution APPLY 2 DROPS (OPHTHALMIC (EYE)) 4 TIMES PER DAY FOR 5 DAYS      sodium chloride  3 % NEBULIZER solution Inhale 1 vial ( 4 mL) by nebulization two (2) times a day. 720 mL 3    tamsulosin  (FLOMAX ) 0.4 mg capsule Take 1 capsule (0.4 mg total) by mouth two (2) times a day. 180 capsule 3    TRELEGY ELLIPTA  200-62.5-25 mcg inhaler INHALE 1 PUFF IN THE MORNING 60 each 5    venlafaxine  (EFFEXOR  XR) 37.5 MG 24 hr capsule Take 1 capsule (37.5 mg total) by mouth daily. 90 capsule 0     No current facility-administered medications for this visit.   [3]   Allergies  Allergen Reactions    Latex      Added based on information entered during case entry, please review and add reactions, type, and severity as needed

## 2024-03-21 ENCOUNTER — Ambulatory Visit: Admit: 2024-03-21 | Payer: Medicare (Managed Care)

## 2024-03-24 ENCOUNTER — Ambulatory Visit
Admission: EM | Admit: 2024-03-24 | Discharge: 2024-03-27 | Disposition: A | Discharge status: Home Health | Payer: Medicare (Managed Care) | Admitting: Student in an Organized Health Care Education/Training Program

## 2024-03-24 ENCOUNTER — Inpatient Hospital Stay
Admission: EM | Admit: 2024-03-24 | Discharge: 2024-03-27 | Disposition: A | Discharge status: Home Health | Payer: Medicare (Managed Care) | Admitting: Student in an Organized Health Care Education/Training Program

## 2024-03-24 LAB — CBC W/ AUTO DIFF
BASOPHILS ABSOLUTE COUNT: 0.1 10*9/L (ref 0.0–0.1)
BASOPHILS RELATIVE PERCENT: 1.1 %
EOSINOPHILS ABSOLUTE COUNT: 0 10*9/L (ref 0.0–0.5)
EOSINOPHILS RELATIVE PERCENT: 0.1 %
HEMATOCRIT: 43 % (ref 39.0–48.0)
HEMOGLOBIN: 14.8 g/dL (ref 12.9–16.5)
LYMPHOCYTES ABSOLUTE COUNT: 0.4 10*9/L — ABNORMAL LOW (ref 1.1–3.6)
LYMPHOCYTES RELATIVE PERCENT: 3.5 %
MEAN CORPUSCULAR HEMOGLOBIN CONC: 34.4 g/dL (ref 32.0–36.0)
MEAN CORPUSCULAR HEMOGLOBIN: 31.6 pg (ref 25.9–32.4)
MEAN CORPUSCULAR VOLUME: 92 fL (ref 77.6–95.7)
MEAN PLATELET VOLUME: 7.5 fL (ref 6.8–10.7)
MONOCYTES ABSOLUTE COUNT: 1.5 10*9/L — ABNORMAL HIGH (ref 0.3–0.8)
MONOCYTES RELATIVE PERCENT: 11.3 %
NEUTROPHILS ABSOLUTE COUNT: 10.9 10*9/L — ABNORMAL HIGH (ref 1.8–7.8)
NEUTROPHILS RELATIVE PERCENT: 84 %
PLATELET COUNT: 333 10*9/L (ref 150–450)
RED BLOOD CELL COUNT: 4.68 10*12/L (ref 4.26–5.60)
RED CELL DISTRIBUTION WIDTH: 14.7 % (ref 12.2–15.2)
WBC ADJUSTED: 13 10*9/L — ABNORMAL HIGH (ref 3.6–11.2)

## 2024-03-24 LAB — MAGNESIUM: MAGNESIUM: 1.8 mg/dL (ref 1.6–2.6)

## 2024-03-24 LAB — BASIC METABOLIC PANEL
ANION GAP: 14 mmol/L (ref 5–14)
BLOOD UREA NITROGEN: 30 mg/dL — ABNORMAL HIGH (ref 9–23)
BUN / CREAT RATIO: 19
CALCIUM: 9.8 mg/dL (ref 8.7–10.4)
CHLORIDE: 98 mmol/L (ref 98–107)
CO2: 27 mmol/L (ref 20.0–31.0)
CREATININE: 1.59 mg/dL — ABNORMAL HIGH (ref 0.73–1.18)
EGFR CKD-EPI (2021) MALE: 44 mL/min/1.73m2 — ABNORMAL LOW (ref >=60)
GLUCOSE RANDOM: 200 mg/dL — ABNORMAL HIGH (ref 70–179)
SODIUM: 139 mmol/L (ref 135–145)

## 2024-03-24 LAB — HIGH SENSITIVITY TROPONIN I - SINGLE: HIGH SENSITIVITY TROPONIN I: 6 ng/L (ref <=53)

## 2024-03-24 NOTE — ED Provider Notes (Signed)
 [facility]  Emergency Department Provider Note      ED Clinical Impression       Diagnosis ICD-10-CM Associated Orders   1. Influenza A  J10.1       2. Chronic obstructive pulmonary disease with acute exacerbation    (CMS-HCC)  J44.1                Impression, Medical Decision Making, Progress Notes and Critical Care      Impression: 79 year old male with a history of lung cancer, COPD, hypertension, asthma, diabetes, and pleural effusion who presented with shortness of breath, weakness, cough,  fatigue, headache, and decreased appetite.  His symptoms started about 10 days ago and have been progressively worsening.  No fevers, nausea, vomiting, or abdominal pain but he reports feeling extremely weak and having decreased appetite.  He has a cough but no chest pain.  He presented with his daughter, who lives next-door to him.  On presentation he was saturating at 90% on room air.  He reported wearing 2 L oxygen intermittently at baseline.  He was mildly tachycardic in the low 100s.  Blood pressure stable and he was afebrile.  On physical exam he had a wheezing in his lungs bilaterally.  Heart rate tachycardic and regular.  Abdomen soft and nondistended.  He was in no acute distress.    Differential diagnosis considered include but are not limited to: pneumonia, influenza, COVID, COPD exacerbation    He has tested positive for influenza and his chest x-ray has shown pneumonia.  Will treat him for pneumonia with antibiotics as well as steroids and DuoNebs for his COPD exacerbation.  Will also administer fluids and assess for improvement in his heart rate and reassess his breathing after treatments.  He would prefer to go home if possible.  His daughter lives next-door and he stated he would have good follow-up if needed.    ED Course as of 03/24/24 2252   Mon Mar 24, 2024   2211 Influenza A/H3(!): Detected   2213 Creatinine(!): 1.59  At baseline.    2227 IMPRESSION:     Multifocal left-sided consolidations suggestive of infection.   Chest X-ray Impression:     Postsurgical sequela of left upper lobectomy, right apical pulmonary nodule, and postradiation changes of the right lower lobe are better seen on CT chest dated 09/30/2023.        2233 Patient x-ray showed pneumonia.  Started on antibiotics.   2249 ECG sinus tachycardia. Normal axis and intervals.  No ST elevation or depression.   2251 Patient handed off to oncoming provider with disposition pending his response to treatments and shared decision making with him and his daughter for inpatient vs outpatient treatment. His CURB-65 score is a two.        Additional MDM Elements         Discussion with other professionals: None  Independent interpretation: EKG(s) -    X-ray(s) -    I have reviewed recent and relavant previous record, including: Outpatient notes -      Portions of this record have been created using Scientist, clinical (histocompatibility and immunogenetics). Dictation errors have been sought, but may not have been identified and corrected.    See chart and nursing documentation for additional ED course details.    ____________________________________________         History        Reason for Visit  Flu Like Symptoms    History of Present Illness  Lee Baxter  is an 79 year old male with lung cancer who presents with flu and pneumonia symptoms. He is accompanied by his daughter.    Symptoms began on Friday before last, leading to a doctor's visit the following Monday where he was diagnosed with the flu. Despite receiving a flu shot, symptoms persisted without improvement.    He experiences shortness of breath and uses home oxygen, typically set at two liters. He has chills and shakes. His appetite has decreased significantly, and he has not been eating or drinking much, although he did consume a Boost drink this morning.    He has headaches and a cough. He uses a Trelegy inhaler once every morning and has a nebulizer at home, although he has not been using it recently. No history of heart problems such as heart failure.    He reports increased weakness and has not been able to engage in normal activities, spending most of his time sitting in a chair. His daughter notes that he has been wobbly and is concerned about the risk of falling. He lives next door to his daughter, who checks on him regularly.    He is currently not undergoing treatment for lung cancer.       Outside Historian(s): Daughter  (EMS, Significant Other, Family, Parent, Caregiver, Friend, Patent Examiner, etc.)        Past Medical History[1]    Past Surgical History[2]      Current Facility-Administered Medications:     acetaminophen  (TYLENOL ) tablet 650 mg, 650 mg, Oral, Once, Lelon Comer BROCKS, MD    amoxicillin  (AMOXIL ) capsule 1,000 mg, 1,000 mg, Oral, Once, Lelon Comer BROCKS, MD    ipratropium-albuterol  (DUO-NEB) 0.5-2.5 mg/3 mL nebulizer solution 3 mL, 3 mL, Nebulization, Once, Lelon Comer BROCKS, MD    methylPREDNISolone sodium succinate (SOLU-Medrol) injection 60 mg, 60 mg, Intravenous, Once, Lelon Comer BROCKS, MD    sodium chloride  0.9% (NS) bolus 1,000 mL, 1,000 mL, Intravenous, Once, Lelon Comer BROCKS, MD    Current Outpatient Medications:     albuterol  HFA 90 mcg/actuation inhaler, TAKE 2 PUFFS BY MOUTH EVERY 6 HOURS AS NEEDED FOR WHEEZE, Disp: 8 g, Rfl: 5    amlodipine  (NORVASC ) 2.5 MG tablet, Take 1 tablet (2.5 mg total) by mouth daily., Disp: 90 tablet, Rfl: 0    atorvastatin  (LIPITOR ) 40 MG tablet, TAKE 1 TABLET BY MOUTH EVERY DAY IN THE EVENING, Disp: 90 tablet, Rfl: 3    blood sugar diagnostic (ACCU-CHEK GUIDE TEST STRIPS) Strp, Use to check blood sugar three times daily as instructed by clinic. E11.9, Disp: 300 strip, Rfl: 3    blood-glucose meter kit, Disp. blood glucose meter kit preferred by patient's insurance. Dx: Diabetes, E11.9, Disp: 1 each, Rfl: 11    dupilumab  (DUPIXENT ) 300 mg/2 mL pen injector, Inject the contents of 1 pen (300 mg) under the skin every fourteen (14) days., Disp: 4 mL, Rfl: 11    empty container Misc, Use as directed to dispose of Dupixent  pens, Disp: 1 each, Rfl: 2    EPINEPHrine  (EPIPEN ) 0.3 mg/0.3 mL injection, Inject 0.3 mL (0.3 mg total) into the muscle once as needed for anaphylaxis for up to 1 dose., Disp: 1 each, Rfl: 0    finasteride  (PROSCAR ) 5 mg tablet, Take 1 tablet (5 mg total) by mouth daily., Disp: 90 tablet, Rfl: 3    fluticasone  propionate (FLONASE ) 50 mcg/actuation nasal spray, 2 sprays into each nostril daily., Disp: 48 mL, Rfl: 7    ipratropium-albuterol  (DUO-NEB) 0.5-2.5 mg/3  mL nebulizer, Inhale 1 vial ( 3 mL) by nebulization every six (6) hours. Use as directed, Disp: 1080 mL, Rfl: 1    lancets Misc, Disp. lancets #100 or amount allowed, Testing BID. Dx: E11.9,Z79.4 (DM Type 2- controlled, insulin  dep), Disp: 100 each, Rfl: 11    MEDICAL SUPPLY ITEM, Pt has DM hx of neuropathy w/ callus. Followed for comprehensive care. For med records, fax release to [phone]., Disp: 2 Units, Rfl: 0    MEDICAL SUPPLY ITEM, 1 pair of diabetic shoes and 3 pairs of inserts. Pt has DM with neuropathy w/ callus. Followed for comprehensive care. For med records, fax release to [phone], Disp: 2 each, Rfl: 0    metFORMIN  (GLUCOPHAGE ) 500 MG tablet, Take 1 tablet (500 mg total) by mouth in the morning and 1 tablet (500 mg total) in the evening. Take with meals., Disp: 180 tablet, Rfl: 2    ofloxacin (OCUFLOX) 0.3 % ophthalmic solution, APPLY 2 DROPS (OPHTHALMIC (EYE)) 4 TIMES PER DAY FOR 5 DAYS, Disp: , Rfl:     sodium chloride  3 % NEBULIZER solution, Inhale 1 vial ( 4 mL) by nebulization two (2) times a day., Disp: 720 mL, Rfl: 3    tamsulosin  (FLOMAX ) 0.4 mg capsule, Take 1 capsule (0.4 mg total) by mouth two (2) times a day., Disp: 180 capsule, Rfl: 3    TRELEGY ELLIPTA  200-62.5-25 mcg inhaler, INHALE 1 PUFF IN THE MORNING, Disp: 60 each, Rfl: 5    venlafaxine  (EFFEXOR  XR) 37.5 MG 24 hr capsule, Take 1 capsule (37.5 mg total) by mouth daily., Disp: 90 capsule, Rfl: 0    Allergies  Latex    Family History[3]    Short Social History[4]       Physical Exam     ED Triage Vitals   Enc Vitals Group      BP 03/24/24 1812 117/77      Pulse 03/24/24 1736 (!) 125      SpO2 Pulse --       Resp 03/24/24 1812 18      Temp 03/24/24 1812 37.2 ??C (98.9 ??F)      Temp src --       SpO2 03/24/24 1736 97 %      Weight 03/24/24 1812 58.1 kg (128 lb 1.4 oz)      Height --       Head Circumference --       Peak Flow --       Pain Score --       Pain Loc --       Pain Education --       Exclude from Growth Chart --        Constitutional: Alert and oriented. Well appearing and in no distress.  Eyes: Conjunctivae are normal.  ENT       Head: Normocephalic and atraumatic.       Nose: No congestion.       Mouth/Throat: Mucous membranes are moist.       Neck: No stridor.  Hematological/Lymphatic/Immunilogical: No cervical lymphadenopathy.  Cardiovascular: Tachycardic rate, regular rhythm. Normal and symmetric distal pulses are present in all extremities.  Respiratory: No acute respiratory distress. Wheezes auscultated in the lungs bilaterally.   Gastrointestinal: Soft and nontender. There is no CVA tenderness.  Musculoskeletal: Normal range of motion in all extremities.       Right lower leg: No tenderness or edema.       Left lower leg: No tenderness or edema.  Neurologic: Normal speech and language. No gross focal neurologic deficits are appreciated.  Skin: Skin is warm, dry and intact. No rash noted.  Psychiatric: Mood and affect are normal. Speech and behavior are normal.       Radiology     XR Chest 2 views   Final Result      Multifocal left-sided consolidations suggestive of infection.       Postsurgical sequela of left upper lobectomy, right apical pulmonary nodule, and postradiation changes of the right lower lobe are better seen on CT chest dated 09/30/2023.              Procedures including Critical Care          [1]   Past Medical History:  Diagnosis Date    Adenomatous colon polyp Aspiration pneumonitis    (CMS-HCC) 06/29/2022    Asthma (HHS-HCC)     Cervical disc disorder 04/17/2013    cervical spine MRI from February 2014 discharge DJD and foraminal stenosis at C4- 5, C3- 4 and C6- 7.     Cervical stenosis of spinal canal 11/06/2019    COPD (chronic obstructive pulmonary disease) (CMS-HCC)     Diabetes mellitus (CMS-HCC) Dx 1990    Type II    Diverticulosis     Hemorrhoids, internal     HL (hearing loss)     Hyperkalemia 07/31/2023    Hypertension     Lung cancer    (CMS-HCC) 1992    Pleural effusion, right 06/25/2022    PVD (posterior vitreous detachment), left eye     Sinusitis     Temporomandibular joint disorders 01/15/2012   [2]   Past Surgical History:  Procedure Laterality Date    CATARACT EXTRACTION W/ INTRAOCULAR LENS IMPLANT Left 02/13/2006    Hecker MD    CATARACT EXTRACTION W/ INTRAOCULAR LENS IMPLANT Right 03/27/2006    Hecker MD    CHOLECYSTECTOMY      HERNIA REPAIR      KIDNEY STONE SURGERY      LUNG LOBECTOMY Left 1992    PR ALLOGRAFT FOR SPINE SURGERY ONLY MORSELIZED N/A 11/06/2019    Procedure: ALLOGRAFT FOR SPINE SURGERY ONLY; MORSELIZED;  Surgeon: Abram JONELLE Gartner, MD;  Location[facility] OR [facility];  Service: Orthopedics    PR ARTHRD PST/PSTLAT TQ 1NTRSPC CRV BELW C2 SEGMENT N/A 11/06/2019    Procedure: ARTHRODESIS, POSTERIOR OR POSTEROLATERAL TECHNIQUE, SINGLE LEVEL; CERVICAL BELOW C2 SEGMENT;  Surgeon: Abram JONELLE Gartner, MD;  Location: [facility] OR [facility];  Service: Orthopedics    PR ARTHRODESIS PST/PSTLAT TQ 1NTRSPC EA ADDL NTRSPC N/A 11/06/2019    Procedure: ARTHRODESIS, POSTERIOR OR POSTEROLATERAL TECHNIQUE, SINGLE LEVEL; EACH ADDITIONAL VERTEBRAL SEGMENT   x4;  Surgeon: Abram JONELLE Gartner, MD;  Location: [facility] OR [facility];  Service: Orthopedics    PR AUTOGRAFT SPINE SURGERY LOCAL FROM SAME INCISION N/A 11/06/2019    Procedure: AUTOGRAFT/SPINE SURG ONLY (W/HARVEST GRAFT); LOCAL (EG, RIB/SPINOUS PROC, LAM FRGMT) OBTAIN FROM SAME INCIS;  Surgeon: Abram JONELLE Gartner, MD;  Location: [facility] OR [facility];  Service: Orthopedics    PR BRNSCHSC TNDSC EBUS DX/TX INTERVENTION [facility] LES Right 11/21/2021    Procedure: BRONCH, RIGID OR FLEXIBLE, INCLUDING FLUORO GUIDANCE, WHEN PERFORMED; WITH TRANSENDOSCOPIC EBUS DURING BRONCHOSCOPIC DIAGNOSTIC OR THERAPEUTIC INTERVENTION(S) FOR PERIPHERAL LESION(S);  Surgeon: Selinda Janina Sermon, MD;  Location: MAIN OR [place];  Service: Pulmonary    PR BRONCHOSCOPY,COMPUTER ASSIST/IMAGE-GUIDED NAVIGATION N/A 11/21/2021    Procedure: ROBOT ION BRONCHOSCOPY,RIGID OR FLEXIBLE,INCLUDE FLUORO WHEN PERFORMED; W/COMPUTER-ASSIST,IMAGE-GUIDED  NAVIGATION;  Surgeon: Selinda Janina Sermon, MD;  Location: MAIN OR [facility];  Service: Pulmonary    PR BRONCHOSCOPY,COMPUTER ASSIST/IMAGE-GUIDED NAVIGATION N/A 11/21/2021    Procedure: BRONCHOSCOPY, RIGID OR FLEXIBLE, INCLUDE FLUORO WHEN PERFORMED; W/COMPUTER-ASSIST, IMAGE-GUIDED NAVIGATION;  Surgeon: Selinda Janina Sermon, MD;  Location: MAIN OR [facility];  Service: Pulmonary    PR BRONCHOSCOPY,DIAGNOSTIC W LAVAGE Right 06/27/2022    Procedure: BRONCHOSCOPY, RIGID OR FLEXIBLE, INCLUDE FLUOROSCOPIC GUIDANCE WHEN PERFORMED; W/BRONCHIAL ALVEOLAR LAVAGE;  Surgeon: Blondie Ronal Anette Arlean, MD;  Location: MAIN OR [place];  Service: Pulmonary    PR BRONCHOSCOPY,PLACEMENT FIDUCIAL MARKERS, 1/MULT N/A 11/21/2021    Procedure: BRONCHOSCOPY, RIGID OR FLEXIBLE, FLOURO WHEN PERFORMED; PLACEMENT OF FIDUCIAL MARKERS, SINGLE OR MULTIPLE;  Surgeon: Selinda Janina Sermon, MD;  Location: MAIN OR [place];  Service: Pulmonary    PR BRONCHOSCOPY,TRANSBRON ASPIR BX Right 11/21/2021    Procedure: BRONCHOSCOPY, RIGID/FLEX, INCL FLUORO; W/TRANSBRONCH NDL ASPIRAT BX, TRACHEA, MAIN STEM &/OR LOBAR BRONCHUS;  Surgeon: Selinda Janina Sermon, MD;  Location: MAIN OR [place];  Service: Pulmonary    PR BRONCHOSCOPY,TRANSBRONCH BIOPSY Right 11/21/2021    Procedure: BRONCHOSCOPY, RIGID/FLEXIBLE, INCLUDE FLUORO GUIDANCE WHEN PERFORMED; W/TRANSBRONCHIAL LUNG BX, SINGLE LOBE;  Surgeon: Selinda Janina Sermon, MD; Location: MAIN OR [place];  Service: Pulmonary    PR C-LAMINOPLASTY W/GRAFT/PLATE, 2 OR MORE N/A 11/06/2019    Procedure: LAMINOPLASTY, CERVICAL, DECOMPRESS SPINAL CORD, 2+ VERTEBRAL SEGMENTS; W/RECONSTRUCT POSTERIOR BONY ELEMENT;  Surgeon: Abram JONELLE Gartner, MD;  Location: [facility] OR [facility];  Service: Orthopedics    PR COLONOSCOPY W/BIOPSY SINGLE/MULTIPLE N/A 07/05/2017    Procedure: COLONOSCOPY, FLEXIBLE, PROXIMAL TO SPLENIC FLEXURE; WITH BIOPSY, SINGLE OR MULTIPLE;  Surgeon: Luke Cory Notch, MD;  [facility];  Service: Gastroenterology    PR IONM 1 ON 1 IN OR W/ATTENDANCE EACH 15 MINUTES N/A 11/06/2019    Procedure: CONTINUOUS INTRAOPERATIVE NEUROPHYSIOLOGY MONITORING IN OR;  Surgeon: Abram JONELLE Gartner, MD;  Location: [facility] OR [facility];  Service: Orthopedics    PR LAM W/O FACETEC FORAMOT/DSKC 1/2 VRT SEG, CERVICAL N/A 11/06/2019    Procedure: LAMINECT W/EXPLOR WO FACETECT 1-2 VERTEB; CERV;  Surgeon: Abram JONELLE Gartner, MD;  Location: [facility] OR [facility];  Service: Orthopedics    PR POSTERIOR SEGMENTAL INSTRUMENTATION 3-6 VRT SEG N/A 11/06/2019    Procedure: POST SEGMT INSTRUM; 3 TO 6 VERTEB SEGMT CERVICAL;  Surgeon: Abram JONELLE Gartner, MD;  Location: [facility] OR [facility];  Service: Orthopedics    SINUS SURGERY     [3]   Family History  Problem Relation Age of Onset    Cancer Daughter     Strabismus Daughter     Hypertension Daughter     No Known Problems Mother     No Known Problems Father     No Known Problems Sister     No Known Problems Brother     No Known Problems Maternal Aunt     No Known Problems Maternal Uncle     No Known Problems Paternal Aunt     No Known Problems Paternal Uncle     No Known Problems Maternal Grandmother     No Known Problems Maternal Grandfather     No Known Problems Paternal Grandmother     No Known Problems Paternal Grandfather     Amblyopia Neg Hx     Blindness Neg Hx     Cataracts Neg Hx     Diabetes Neg Hx     Glaucoma Neg Hx     Macular degeneration Neg Hx  Retinal detachment Neg Hx Stroke Neg Hx     Thyroid disease Neg Hx    [4]   Social History  Tobacco Use    Smoking status: Former     Current packs/day: 0.00     Average packs/day: 1 pack/day for 35.0 years (35.0 ttl pk-yrs)     Types: Cigarettes     Start date: 02/19/1953     Quit date: 08/12/1987     Years since quitting: 36.6    Smokeless tobacco: Current     Types: Chew   Vaping Use    Vaping status: Never Used   Substance Use Topics    Alcohol use: No     Alcohol/week: 0.0 standard drinks of alcohol    Drug use: No        Lelon Comer BROCKS, MD  Resident  03/24/24 [phone]

## 2024-03-24 NOTE — ED Triage Note (Signed)
 Complaints of productive cough, clear, since 10 days.  Denies shortness or breath.  Chews tobacco.  Known HTN, DM and Hyperlipidemia.

## 2024-03-24 NOTE — ED Triage Note (Signed)
 Pt c/o flu-like symptoms x10 days. Feeling weak

## 2024-03-25 LAB — POTASSIUM: POTASSIUM: 4 mmol/L (ref 3.4–4.8)

## 2024-03-25 MED ADMIN — ipratropium-albuterol (DUO-NEB) 0.5-2.5 mg/3 mL nebulizer solution 3 mL: 3 mL | RESPIRATORY_TRACT | @ 22:00:00

## 2024-03-25 MED ADMIN — insulin lispro (HumaLOG) injection CORRECTIONAL 0-20 Units: 0-20 [IU] | SUBCUTANEOUS | @ 22:00:00

## 2024-03-25 MED ADMIN — amoxicillin (AMOXIL) capsule 1,000 mg: 1000 mg | ORAL | @ 05:00:00 | Stop: 2024-03-24

## 2024-03-25 MED ADMIN — methylPREDNISolone sodium succinate (SOLU-Medrol) injection 60 mg: 60 mg | INTRAVENOUS | @ 05:00:00 | Stop: 2024-03-24

## 2024-03-25 MED ADMIN — heparin (porcine) 5,000 unit/mL injection 5,000 Units: 5000 [IU] | SUBCUTANEOUS | @ 22:00:00

## 2024-03-25 MED ADMIN — amlodipine (NORVASC) tablet 2.5 mg: 2.5 mg | ORAL | @ 22:00:00

## 2024-03-25 MED ADMIN — acetaminophen (TYLENOL) tablet 650 mg: 650 mg | ORAL | @ 05:00:00 | Stop: 2024-03-24

## 2024-03-25 MED ADMIN — cyanocobalamin (vitamin B-12) tablet 1,000 mcg: 1000 ug | ORAL | @ 22:00:00

## 2024-03-25 MED ADMIN — metFORMIN (GLUCOPHAGE) tablet 500 mg: 500 mg | ORAL | @ 22:00:00

## 2024-03-25 MED ADMIN — ipratropium-albuterol (DUO-NEB) 0.5-2.5 mg/3 mL nebulizer solution 3 mL: 3 mL | RESPIRATORY_TRACT | @ 05:00:00 | Stop: 2024-03-24

## 2024-03-25 MED ADMIN — finasteride (PROSCAR) tablet 5 mg: 5 mg | ORAL | @ 22:00:00

## 2024-03-25 MED ADMIN — cefepime (MAXIPIME) 2 g in sodium chloride 0.9 % (NS) 100 mL IVPB-MBP: 2 g | INTRAVENOUS | @ 13:00:00 | Stop: 2024-03-25

## 2024-03-25 MED ADMIN — vancomycin (VANCOCIN) 1250 mg in sodium chloride (NS) 0.9% 250 mL IVPB: 1250 mg | INTRAVENOUS | @ 15:00:00 | Stop: 2024-03-25

## 2024-03-25 MED ADMIN — sodium chloride 0.9% (NS) bolus 1,000 mL: 1000 mL | INTRAVENOUS | @ 05:00:00

## 2024-03-25 MED ADMIN — predniSONE (DELTASONE) tablet 40 mg: 40 mg | ORAL | @ 22:00:00 | Stop: 2024-03-30

## 2024-03-25 NOTE — ED Progress Note (Signed)
 Received sign out from previous provider.    Patient Summary: KANDON HOSKING is a 79 y.o. male  79 year old male with a history of lung cancer, COPD, hypertension, asthma, diabetes, and pleural effusion who presented with shortness of breath, weakness, cough,  fatigue, headache, and decreased appetite.   Action List:   Steroids, DuoNeb  Reassessment    Updates  ED Course as of 03/25/24 2104   Tue Mar 25, 2024   0121 Upon reevaluation patient states that he has not been needing oxygen at home for a 'very long time' although he was unable to quantify this.  He used to be on 2 L.  He is now requiring 2 L oxygen here in the emergency room.  Had shared decision-making with the patient and will admit him for IV antibiotics given his risk factors.  Recommending [facility]   0430 Patient had a desaturation event to the low 80s while sleeping.  Upon waking and increasing his oxygen to 4 L he started satting 95%.  Will continue to monitor.   0700 Signed out to oncoming provider pending admission

## 2024-03-25 NOTE — ED Notes (Signed)
 Bed: G133  Expected date:   Expected time:   Means of arrival:   Comments:

## 2024-03-25 NOTE — H&P (Signed)
 [facility]   History and Physical       Assessment and Plan   Lee Baxter is a 79 y.o. male with PMHx notable for COPD who is presenting to [facility] with Pneumonia of left lung due to infectious organism, following a Flu A infection    LLL PNA, c/f bacterial superinfection   Influenza A infection   COPD exacerbation 2/2 above:  Sick with influenza likely since ~1/3. Felt better about a week in and then progressed, this and imaging is c/w superimposed bacterial PNA. CXR on my read with LLL infiltrate with correlates with exam.   - Continue Cefepime/Vanc (structural lung disease at risk for pseudomonas, post-flu at risk for MRSA)  - F/u MRSA swab and stop vanc if wnl  - Wean O2 as tolerated, goal SpO2 88-92% given COPD  - Start Pred 40mg  x 5 days  - Duonebs q6h + albuterol  PRN additionally  - Hold Trelegy while on above meds, resume at discharge     Hypertension:  - Continue Amlodipine  2.5mg  daily    CKD3a:  Cr stable at his baselien of 1.6  - Encourage PO hydration    B12 deficiency  - Continue PO supplementation 1000mcg daily    Chronic neck/back pain:  Prior C- spine surgery. Has known DJD. Not taking SNRI per PCP note and per patient  - Tylenol  PRN    DM2 c/b peripheral neuropathy  Well controlled outpatient with Metformin  500mg  BID  - Continue Metformin  as taking good PO and Cr at baseline  - Add SSI while on steroids     Prophylaxis  -heparin     Diet  -Nutrition Therapy Regular/House    Code Status / HCDM  -DNR and DNI, Discussed with patient at the time of admission   -  HCDM (patient stated preference): Lee Baxter - Daughter - [phone]    Anticipated Medically Ready for Discharge: Anticipated in 2-4 Days    Issues Impacting Complexity of Management:  -The patient is at high risk from Hospital immobility in an elderly patient given baseline poor functional status with a high risk of causing delirium and further decline in function    Medical Decision Making: Reviewed records from the following unique sources  PCP note 03/17/2024.    I personally spent greater than 75 minutes face-to-face and non-face-to-face in the care of this patient, which includes all pre, intra, and post visit time on the date of service.  All documented time was specific to the E/M visit and does not include any procedures that may have been performed.    HPI      Lee Baxter is an 39M with Pmhx notable for COPD, lung cancer s/p resection and then separately radiation, who does not currently use home O2, who is presenting with 10 days of flu-like symptoms, and worsening weakness and dyspnea over the last couple of days. He felt like he was getting a little bit better, but yesterday started feeling worse, prompting ED visit.     He does not generally use oxygen at home at baseline (notes that in 2024 he was briefly on it after radiation but came off quickly), but still has a portable concentrator, and so has been using 1-2L for the last week or so to help with his dyspnea.     He has cough productive of clear mucous, and just generally feels weak and run down with dyspnea taht is worse than his baseline.     He was on 4L  saturating 98%, I turned him down to 2L and he was maintaining saturations of 92% at rest.     Med Rec Confidence   I reviewed the Medication List. The current list is Accurate    Physical Exam   Temp:  [37.1 ??C (98.8 ??F)-37.2 ??C (98.9 ??F)] 37.1 ??C (98.8 ??F)  Pulse:  [73-125] 74  SpO2 Pulse:  [68-91] 81  Resp:  [18-27] 18  BP: (112-160)/(61-88) 123/62  SpO2:  [90 %-100 %] 98 %  Body mass index is 21.31 kg/m??.  Gen: sitting up in bed, tired appearing, daughter at bedside  HEENT: MMM, [place] in place   CV: RRR S1/S2, no MRG  Pulm: wheezing diffusely, but worst at LLSB, cough with deep breaths   Abd: soft, non-tender, normal BS  Ext: warm and well perfused, no edema  Neuro: Alert, able to discuss plan of care

## 2024-03-26 LAB — BASIC METABOLIC PANEL
ANION GAP: 16 mmol/L — ABNORMAL HIGH (ref 5–14)
BLOOD UREA NITROGEN: 33 mg/dL — ABNORMAL HIGH (ref 9–23)
BUN / CREAT RATIO: 21
CALCIUM: 9.4 mg/dL (ref 8.7–10.4)
CHLORIDE: 103 mmol/L (ref 98–107)
CO2: 22 mmol/L (ref 20.0–31.0)
CREATININE: 1.56 mg/dL — ABNORMAL HIGH (ref 0.73–1.18)
EGFR CKD-EPI (2021) MALE: 45 mL/min/1.73m2 — ABNORMAL LOW (ref >=60)
GLUCOSE RANDOM: 256 mg/dL — ABNORMAL HIGH (ref 70–179)
POTASSIUM: 4.3 mmol/L (ref 3.4–4.8)
SODIUM: 141 mmol/L (ref 135–145)

## 2024-03-26 LAB — MAGNESIUM: MAGNESIUM: 1.7 mg/dL (ref 1.6–2.6)

## 2024-03-26 LAB — PHOSPHORUS: PHOSPHORUS: 3.6 mg/dL (ref 2.4–5.1)

## 2024-03-26 MED ADMIN — ipratropium-albuterol (DUO-NEB) 0.5-2.5 mg/3 mL nebulizer solution 3 mL: 3 mL | RESPIRATORY_TRACT | @ 23:00:00

## 2024-03-26 MED ADMIN — ipratropium-albuterol (DUO-NEB) 0.5-2.5 mg/3 mL nebulizer solution 3 mL: 3 mL | RESPIRATORY_TRACT | @ 15:00:00

## 2024-03-26 MED ADMIN — ipratropium-albuterol (DUO-NEB) 0.5-2.5 mg/3 mL nebulizer solution 3 mL: 3 mL | RESPIRATORY_TRACT | @ 18:00:00

## 2024-03-26 MED ADMIN — ipratropium-albuterol (DUO-NEB) 0.5-2.5 mg/3 mL nebulizer solution 3 mL: 3 mL | RESPIRATORY_TRACT | @ 03:00:00

## 2024-03-26 MED ADMIN — oseltamivir (TAMIFLU) capsule 30 mg: 30 mg | ORAL | @ 14:00:00 | Stop: 2024-03-30

## 2024-03-26 MED ADMIN — oseltamivir (TAMIFLU) capsule 30 mg: 30 mg | ORAL | @ 04:00:00 | Stop: 2024-03-30

## 2024-03-26 MED ADMIN — insulin lispro (HumaLOG) injection CORRECTIONAL 0-20 Units: 0-20 [IU] | SUBCUTANEOUS | @ 23:00:00

## 2024-03-26 MED ADMIN — insulin lispro (HumaLOG) injection CORRECTIONAL 0-20 Units: 0-20 [IU] | SUBCUTANEOUS | @ 04:00:00

## 2024-03-26 MED ADMIN — insulin lispro (HumaLOG) injection CORRECTIONAL 0-20 Units: 0-20 [IU] | SUBCUTANEOUS | @ 18:00:00

## 2024-03-26 MED ADMIN — insulin lispro (HumaLOG) injection CORRECTIONAL 0-20 Units: 0-20 [IU] | SUBCUTANEOUS | @ 14:00:00

## 2024-03-26 MED ADMIN — cefepime (MAXIPIME) 2 g in sodium chloride 0.9 % (NS) 100 mL IVPB-MBP: 2 g | INTRAVENOUS | @ 14:00:00 | Stop: 2024-03-27

## 2024-03-26 MED ADMIN — cefepime (MAXIPIME) 2 g in sodium chloride 0.9 % (NS) 100 mL IVPB-MBP: 2 g | INTRAVENOUS | @ 01:00:00 | Stop: 2024-03-27

## 2024-03-26 MED ADMIN — heparin (porcine) 5,000 unit/mL injection 5,000 Units: 5000 [IU] | SUBCUTANEOUS | @ 11:00:00

## 2024-03-26 MED ADMIN — heparin (porcine) 5,000 unit/mL injection 5,000 Units: 5000 [IU] | SUBCUTANEOUS | @ 03:00:00

## 2024-03-26 MED ADMIN — heparin (porcine) 5,000 unit/mL injection 5,000 Units: 5000 [IU] | SUBCUTANEOUS | @ 20:00:00

## 2024-03-26 MED ADMIN — amlodipine (NORVASC) tablet 2.5 mg: 2.5 mg | ORAL | @ 14:00:00

## 2024-03-26 MED ADMIN — cyanocobalamin (vitamin B-12) tablet 1,000 mcg: 1000 ug | ORAL | @ 14:00:00

## 2024-03-26 MED ADMIN — metFORMIN (GLUCOPHAGE) tablet 500 mg: 500 mg | ORAL | @ 23:00:00

## 2024-03-26 MED ADMIN — metFORMIN (GLUCOPHAGE) tablet 500 mg: 500 mg | ORAL | @ 14:00:00

## 2024-03-26 MED ADMIN — insulin NPH (HumuLIN,NovoLIN) injection 6 Units: .2 [IU]/kg/d | SUBCUTANEOUS | @ 14:00:00

## 2024-03-26 MED ADMIN — nystatin (MYCOSTATIN) oral suspension: 500000 [IU] | ORAL | @ 23:00:00

## 2024-03-26 MED ADMIN — atorvastatin (LIPITOR) tablet 40 mg: 40 mg | ORAL | @ 03:00:00

## 2024-03-26 MED ADMIN — tamsulosin (FLOMAX) 24 hr capsule 0.4 mg: .4 mg | ORAL | @ 14:00:00

## 2024-03-26 MED ADMIN — tamsulosin (FLOMAX) 24 hr capsule 0.4 mg: .4 mg | ORAL | @ 03:00:00

## 2024-03-26 MED ADMIN — finasteride (PROSCAR) tablet 5 mg: 5 mg | ORAL | @ 14:00:00

## 2024-03-26 MED ADMIN — predniSONE (DELTASONE) tablet 40 mg: 40 mg | ORAL | @ 14:00:00 | Stop: 2024-03-30

## 2024-03-26 NOTE — Consults (Signed)
 WOCN Consult Services                                                                 Wound Evaluation     Reason for Consult:   - Initial  - Moisture Associated Skin Damage  - Wound    Problem List:   Principal Problem:    Pneumonia of left lung due to infectious organism  Active Problems:    Primary hypertension    Sensorineural hearing loss    Asthma-COPD overlap syndrome    (CMS-HCC)    GERD without esophagitis    Dry eye syndrome of bilateral lacrimal glands    Benign prostatic hyperplasia with incomplete bladder emptying    Assessment: Per EMR, Lee Baxter is a 79 y.o. male with PMHx notable for COPD who is presenting to [facility] with Pneumonia of left lung due to infectious organism, following a Flu A infection     We were consulted by nursing for a wound to the left upper extremity and for MASD to the inner thighs and groin. Patient assessed on 4 Oncology, alert, oriented, and amenable to assessment. Focused assessment of upper extremities and bilateral groin was performed.    He reports the wound to his left upper arm is an abrasion that has been healing. On assessment today the site is covered by a crust of dried adherent exudate. The patient's preference is to leave it open to air.     The patient also reported having redness that was itchy to his bilateral groin. He noted this began prior to admission when he was experiencing urinary incontinence because he could not get to the bathroom in time which caused his groin to get wet. He notes he is no longer experiencing urinary incontinence and this redness+itching has improved. His bilateral groin are intact, dry, and have erythema consistent with a fungal rash. Recommend cleansing the area and applying antifungal powder daily.     Wound 03/26/24 Traumatic Abrasion Arm Left;Upper;Outer (Active)   Properties   Placement Date 03/26/24   Placement Time 1300   Location Arm   Primary Wound Type Traumatic Secondary Wound Type - Traumatic Abrasion   Wound Location Orientation Left;Upper;Outer      Assessments 03/26/2024 12:45 PM   Wound Image     Wound Length (cm) 2 cm   Wound Width (cm) 1.3 cm   Wound Surface Area (cm^2) 2.04 cm^2   Peri-wound Assessment      Clean;Dry;Intact;Hypopigmentation   Odor None   Site Assessment Other (Comment) (dried adherent exudate (scab))   Dressing Open to air       Wound 03/26/24 Irritant Contact Dermatitis Rash/Dermatitis Groin Anterior;Left;Proximal;Upper (Active)   Properties   Placement Date 03/26/24   Placement Time 1300   Location Groin   Primary Wound Type Irritant Con   Secondary Wound Type - Irritant Contact Dermatitis Rash/Dermati   Wound Location Orientation Anterior;Left;Proximal;Upper      Assessments 03/26/2024 12:45 PM   Wound Image      Peri-wound Assessment      Clean;Dry;Intact   Odor None   Site Assessment Intact;Pink;Dry   Treatments Cleansed/Irrigation;Other (Comment) (antifungal powder)   Dressing Open to air       Continence Status:   Continent  Moisture Associated Skin Damage:   - possible moisture associated skin damage to the groin, though more likely a fungal rash exacerbated by moisture     Lab Results   Component Value Date    WBC 13.0 (H) 03/24/2024    HGB 14.8 03/24/2024    HCT 43.0 03/24/2024    A1C 6.9 (H) 01/11/2024    GLUF 142 11/20/2013    GLU 256 (H) 03/26/2024    POCGLU 371 (H) 03/26/2024    ALBUMIN 3.8 07/30/2023    PROT 7.7 07/30/2023       Support Surface:   - Low Air Loss    Offloading:  Left: Pillow  Right: Pillow    Type Debridement Completed By WOCN:  N/A    Teaching:  - Moisture management  - Wound care    WOCN Recommendations:   - Contact WOCN with questions, concerns, or wound deterioration.  - The patient's abrasion to the left upper extremity may remain open to air per his preference.  - Continue antifungal powder and cleansing of the bilateral groin daily.    Topical Therapy/Interventions:   - Antifungal powder    Recommended Consults:  - Not Applicable    WOCN Follow Up:  - We will sign off at this time    Plan of Care Discussed With:   - Patient  - RN primary    Supplies Ordered: No    Workup Time:   45 minutes    Elsie Batty RN BSN  Wound, Ostomy and Continence Consult Service

## 2024-03-26 NOTE — Plan of Care (Signed)
 Shift Summary  Admitted patient from ED. A&o x4, Ambulatory with standby assist and cane.  Noted MASD on groins and inner thighs. Consult to wound care nurse added.  Correctional insulin  was administered after a high blood glucose reading, with no adverse events documented.    Skin integrity was supported with pressure reduction devices and limited adhesive use, though a wound on the left arm and MASD were present.    Fall prevention strategies were maintained, including bed alarm activation and frequent visual checks, with safe ambulation noted.    Pain remained at 0 throughout the shift, and comfort was maintained with rest.    Overall, the shift was stable with no new hospital-acquired injuries or infection-related symptoms documented.     Absence of Hospital-Acquired Illness or Injury: No new hospital-acquired injuries were documented; fall reduction interventions and bed alarm were maintained throughout the shift. No changes in IV site condition were noted, and skin protection measures such as limited adhesive use and pressure-redistributing mattress were consistently applied.     Optimal Comfort and Wellbeing: Pain was consistently rated at 0, and comfort was maintained with rest. No interventions for pain were required during the shift.     Absence of Infection Signs and Symptoms: Temperature remained stable and within normal range, and MRSA screen was not detected. No new symptoms related to infection were reported.     Absence of Fall and Fall-Related Injury: Bed alarm was activated and hourly visual checks were performed throughout the shift; patient ambulated safely with a cane and no falls occurred. Fall reduction program and mobility aids were maintained.     Skin Health and Integrity: Skin assessment revealed a wound on the left arm and moisture-associated dermatitis in the groin and inner thighs; Braden score decreased slightly but remained above risk threshold. Pressure reduction techniques and devices were used, and adhesive use was limited to protect skin.

## 2024-03-26 NOTE — Plan of Care (Signed)
 Pt A&O x4 and blood sugars were closely monitored on this shift. Pt's blood sugar was 371 this PM and provider was notified. Insulin  was provided as ordered. Pt denied SOB or pain and appeared to be comfortable on this shift. Pt completed the and tolerated it well. Outcomes are posted in a nursing note as requested by provider. Pt has new thrush and provider was notified. Pt maintained safety and no acute events occurred on this shift.     Problem: Adult Inpatient Plan of Care  Goal: Absence of Hospital-Acquired Illness or Injury  Outcome: Shift Focus  Intervention: Prevent Skin Injury  Recent Flowsheet Documentation  Taken 03/26/2024 1236 by Elgie Reagin, RN  Positioning for Skin: Supine/Back  Taken 03/26/2024 1116 by Elgie Reagin, RN  Positioning for Skin: Supine/Back  Taken 03/26/2024 1115 by Elgie Reagin, RN  Positioning for Skin: Sitting in Chair  Taken 03/26/2024 0907 by Elgie Reagin, RN  Positioning for Skin: Supine/Back  Goal: Optimal Comfort and Wellbeing  Outcome: Shift Focus     Problem: Infection  Goal: Absence of Infection Signs and Symptoms  Outcome: Shift Focus     Problem: Fall Injury Risk  Goal: Absence of Fall and Fall-Related Injury  Outcome: Shift Focus

## 2024-03-26 NOTE — Consults (Signed)
 OCCUPATIONAL THERAPY  Evaluation (03/26/24 1005)    Patient Name:  Lee Baxter       Medical Record Number: [id number]     Date of Birth: 1945-09-06  Sex: Male      Post-Discharge Occupational Therapy Recommendations: 3x weekly          Equipment Recommendation  OT DME Recommendations: None       OT Treatment Diagnosis: Generalized muscle weakness         Assessment  Assessment: RAI SEVERNS is a 79 y.o. male with PMHx notable for COPD who is presenting to [facility] with Pneumonia of left lung due to infectious organism, following a Flu A infection.At baseline, pt is modI with all ADLs and functional mobility with use of a quad cane. Presenting today slightly below functional baseline and with decreased endurance. Requires supplemental oyxgen at baseline. Benefits from energy conservation education. Will continue to follow pt at low frequency while inpatient. Consideration of pt's occupational profile, assessment review, level of clinical decision making involved, and intervention plan, indicates pt is currently a low complexity case, presenting to OT with the difficulties as stated above.  Recommend post-acute OT 3x/wk to maximize safety and functional independence.    Problem List: Decreased activity tolerance, Decreased endurance, Decreased mobility, Fall risk, Impaired ADLs, Impaired balance, Impaired hearing  Personal Factors/Comorbidities (Occupational Profile and History Review): Brief (Low)  Assessment of Occupational Performance : Endurance, Mobility  Clinical Decision Making: Low Complexity    Skilled Interventions Performed: Other  Today's Interventions: Provided education on OT role, POC, discharge recommendations, energy conservation techniques and fall prevention. Sup>sit at EOB with independence, STS from EOB with modI and cane, completes functional mobility with modI cane. Doffs/donns socks sitting EOB with modI and use of figure 4 technique. Simulates UB bathing/dressing and LB bathing tasks with supervision-modI. Tolerates session well, VSS throughout.    Activity Tolerance During Today's Session  Tolerated treatment well    Plan  Planned Frequency of Treatment: Plan of Care Initiated: 03/26/24  1-2x per day Weekly Frequency: 2-3 days per week  Planned Treatment Duration: 04/09/24    Planned Interventions:  Education (Patient/Family/Caregiver), Self-Care/Home Training, Therapeutic Exercise, Home Exercise Program, Therapeutic Activity      GOALS:   Patient and Family Goals: none stated    Short Term:   SHORT GOAL #1: Pt will compelte full body dressing with clothing retreival with modI and LRAD.   Time Frame : 2 weeks  SHORT GOAL #2: Pt will complete 10+ min standing ADL with modI and LRAD.   Time Frame : 2 weeks  SHORT GOAL #3: Pt will verbalize 3 energy cosnervation techniques to utilize during ADL routine with >2 verbal cues.   Time Frame : 2 weeks     Long Term Goal #1: Pt will score 24/24 on AMPAC.  Time Frame: 4 weeks    Prognosis:  Good  Positive Indicators:  PLOF and support system  Barriers to Discharge: Endurance deficits    Subjective  Prior Functional Status Pt is independent with ADLs and functional mobility at baseline with use of a SPC. His daughter lives across the street and his grandson is not currently working and is able to stay with the patient during the day. Typically sleeps on the couch or in the recliner. Reports 1 fall where he fell off the couch and was able to get up on his own. Still drives self. Uses supplemental oxygen at baseline.    Living Situation  Living environment:  Trailer/Mobile home  Lives With: Alone  Home Living: One level home, Stairs to enter with rails, Walk-in shower, Hand-held shower hose, Shower chair with back, Standard height toilet  Rail placement (outside): Bilateral rails in reach  Number of Stairs to Enter (outside): 5  Caregiver Identified?: Yes  Caregiver Availability: Days, Nights (pts daughter lives across the street and can assist pt, however she works during the day; pt reports his grandson is not currently working and can be with him during the day)  Caregiver Ability: Limited lifting  Caregiver Identified?: Yes (reports grandson can stay with him and assist as needed)  Equipment available at home: Quad cane, Oxygen, Rolling walker, Hand-held shower hose, Lift-recliner    Medical Tests / Procedures: Reviewed in EPIC.       Patient / Caregiver reports: if you watch where you are going you'll be alright      Past Medical History[1] Social History     Tobacco Use    Smoking status: Former     Current packs/day: 0.00     Average packs/day: 1 pack/day for 35.0 years (35.0 ttl pk-yrs)     Types: Cigarettes     Start date: 02/19/1953     Quit date: 08/12/1987     Years since quitting: 36.6    Smokeless tobacco: Current     Types: Chew   Substance Use Topics    Alcohol use: No     Alcohol/week: 0.0 standard drinks of alcohol      Past Surgical History[2] Family History[3]     Latex     Objective Findings  Precautions / Restrictions  Isolation precautions      Weight Bearing   (Non applicable)    Required Braces or Orthoses  Non-applicable      Communication Preference  Verbal       Pain  denies pain    Equipment / Environment  Vascular access (PIV, TLC, Port-a-cath, PICC), Supplemental oxygen (2L SpO2)    Cognition   Orientation Level:  Oriented x 4   Arousal/Alertness:  Appropriate responses to stimuli   Attention Span:  Appears intact   Memory:  Appears intact   Following Commands:  Follows all commands without difficulty   Safety Judgment:  Decreased awareness of need for safety   Awareness of Errors and Problem Solving:  Patient self-corrected errors    Vision / Hearing   Vision: No acute deficits identified  Vision Comments: denies acute visual changes  Hearing: Hearing impairment, Hearing aids not present   Hearing: wears bilateral hearing aids at baseline     Hand Function:  Right Hand Function: Right hand grip strength, ROM and coordination WNL  Left Hand Function: Left hand grip strength, ROM and coordination WNL  Hand Dominance: Right    Skin Inspection:  Skin Inspection: Abrasion  Skin Inspection comment: abrasion on L tricep, pt reporting it is from his fall    Face/Cervical ROM:  Face ROM: WFL  Cervical ROM: WFL    ROM / Strength:  UE ROM/Strength: Left WFL, Right WFL  LE ROM/Strength: Left WFL, Right WFL    Sensation:  RUE Sensation: RUE intact  LUE Sensation: LUE intact  RLE Sensation: RLE intact  LLE Sensation: LLE intact  Sensory/ Proprioception/ Stereognosis comments: denies numbness and tingling at this time; reports he gets intermittent tingling in B hands however resolves on its own    Balance:  Static Sitting-Level of Assistance: Independent  Dynamic Sitting-Level of Assistance: Independent    Static  Standing-Level of Assistance: Supervision (w/ cane)  Dynamic Standing - Level of Assistance: Stand by assistance  Standing Balance comments: w/ cane    Functional Mobility  Transfers: Modified Independent (w/ cane)  Bed Mobility : Independent (w/ flat bed)    Ambulation  Level of Assistance: Modified independent, requires aide device or extra time  Assistive Device: [employer]    ADLs  Feeding : Independent  Grooming: Set Up Assist, Performed standing  Bathing: Supervision  Toileting: Supervision  UB Dressing: Independent, Performed seated  LB Dressing: Modified Independent, Performed seated    IADLs: NT    Vitals / Orthostatics  Vitals/Orthostatics: Start of session: HR 113, BP 136/76 (94), SpO2 96%. HR as high as 125 with mobility and SpO2 92% after mobility and improving to 96% with seated rest break.    Patient at end of session: All needs in reach, Alarm activated, In chair, Notified Nurse, Lines intact     Occupational Therapy Session Duration  OT Individual [mins]: 8  OT Co-Treatment [mins]: 39 (with Amy Evern, PT)  Reason for Co-treatment: Poor activity tolerance       AM-PAC Daily Activity Assessment  Lower Body Dressing assistance needs: None - Modified Independent/Independent  Bathing assistance needs: A Little - Minimal/Contact Guard Assist/Supervision  Toileting assistance needs: A Little - Minimal/Contact Guard Assist/Supervision  Upper Body Dressing assistance needs: None - Modified Independent/Independent  Personal Grooming assistance needs: None - Modified Independent/Independent  Eating Meals assistance needs: None - Modified Independent/Independent      Daily Activity Score: 22    Score (in points): % of Functional Impairment, Limitation, Restriction  5: 100% impaired, limited, restricted  6-7: At least 80%, but less than 100% impaired, limited restricted  8-11: At least 60%, but less than 80% impaired, limited restricted  12-16: At least 40%, but less than 60% impaired, limited restricted  17-18: At least 20%, but less than 40% impaired, limited restricted  19: At least 1%, but less than 20% impaired, limited restricted  20: 0% impaired, limited restricted    'AM-PAC' forms are Copyright protected by [employer] [facility]       I attest that I have reviewed the above information.  Signed: Hortencia FORBES Budge, OT  Filed 03/26/2024         [1]   Past Medical History:  Diagnosis Date    Adenomatous colon polyp     Aspiration pneumonitis    (CMS-HCC) 06/29/2022    Asthma (HHS-HCC)     Cervical disc disorder 04/17/2013    cervical spine MRI from February 2014 discharge DJD and foraminal stenosis at C4- 5, C3- 4 and C6- 7.     Cervical stenosis of spinal canal 11/06/2019    COPD (chronic obstructive pulmonary disease) (CMS-HCC)     Diabetes mellitus (CMS-HCC) Dx 1990    Type II    Diverticulosis     Hemorrhoids, internal     HL (hearing loss)     Hyperkalemia 07/31/2023    Hypertension     Lung cancer    (CMS-HCC) 1992    Pleural effusion, right 06/25/2022    PVD (posterior vitreous detachment), left eye     Sinusitis     Temporomandibular joint disorders 01/15/2012   [2]   Past Surgical History:  Procedure Laterality Date    CATARACT EXTRACTION W/ INTRAOCULAR LENS IMPLANT Left 02/13/2006    Hecker MD    CATARACT EXTRACTION W/ INTRAOCULAR LENS IMPLANT Right 03/27/2006    Cleatus MD  CHOLECYSTECTOMY      HERNIA REPAIR      KIDNEY STONE SURGERY      LUNG LOBECTOMY Left 1992    PR ALLOGRAFT FOR SPINE SURGERY ONLY MORSELIZED N/A 11/06/2019    Procedure: ALLOGRAFT FOR SPINE SURGERY ONLY; MORSELIZED;  Surgeon: Abram JONELLE Gartner, MD;  [facility]: [facility];  Service: Orthopedics    PR ARTHRD PST/PSTLAT TQ 1NTRSPC CRV BELW C2 SEGMENT N/A 11/06/2019    Procedure: ARTHRODESIS, POSTERIOR OR POSTEROLATERAL TECHNIQUE, SINGLE LEVEL; CERVICAL BELOW C2 SEGMENT;  Surgeon: Abram JONELLE Gartner, MD;  [facility]: [facility] OR [facility];  Service: Orthopedics    PR ARTHRODESIS PST/PSTLAT TQ 1NTRSPC EA ADDL NTRSPC N/A 11/06/2019    Procedure: ARTHRODESIS, POSTERIOR OR POSTEROLATERAL TECHNIQUE, SINGLE LEVEL; EACH ADDITIONAL VERTEBRAL SEGMENT   x4;  Surgeon: Abram JONELLE Gartner, MD;  Location: [facility] OR [facility];  Service: Orthopedics    PR AUTOGRAFT SPINE SURGERY LOCAL FROM SAME INCISION N/A 11/06/2019    Procedure: AUTOGRAFT/SPINE SURG ONLY (W/HARVEST GRAFT); LOCAL (EG, RIB/SPINOUS PROC, LAM FRGMT) OBTAIN FROM SAME INCIS;  Surgeon: Abram JONELLE Gartner, MD;  Location: [facility] OR [facility];  Service: Orthopedics    PR BRNSCHSC TNDSC EBUS DX/TX INTERVENTION [facility] LES Right 11/21/2021    Procedure: BRONCH, RIGID OR FLEXIBLE, INCLUDING FLUORO GUIDANCE, WHEN PERFORMED; WITH TRANSENDOSCOPIC EBUS DURING BRONCHOSCOPIC DIAGNOSTIC OR THERAPEUTIC INTERVENTION(S) FOR PERIPHERAL LESION(S);  Surgeon: Selinda Janina Sermon, MD;  Location: MAIN OR [place];  Service: Pulmonary    PR BRONCHOSCOPY,COMPUTER ASSIST/IMAGE-GUIDED NAVIGATION N/A 11/21/2021    Procedure: ROBOT ION BRONCHOSCOPY,RIGID OR FLEXIBLE,INCLUDE FLUORO WHEN PERFORMED; W/COMPUTER-ASSIST,IMAGE-GUIDED NAVIGATION;  Surgeon: Selinda Janina Sermon, MD;  Location: MAIN OR [place];  Service: Pulmonary    PR BRONCHOSCOPY,COMPUTER ASSIST/IMAGE-GUIDED NAVIGATION N/A 11/21/2021    Procedure: BRONCHOSCOPY, RIGID OR FLEXIBLE, INCLUDE FLUORO WHEN PERFORMED; W/COMPUTER-ASSIST, IMAGE-GUIDED NAVIGATION;  Surgeon: Selinda Janina Sermon, MD;  Location: MAIN OR [place];  Service: Pulmonary    PR BRONCHOSCOPY,DIAGNOSTIC W LAVAGE Right 06/27/2022    Procedure: BRONCHOSCOPY, RIGID OR FLEXIBLE, INCLUDE FLUOROSCOPIC GUIDANCE WHEN PERFORMED; W/BRONCHIAL ALVEOLAR LAVAGE;  Surgeon: Blondie Ronal Anette Arlean, MD;  Location: MAIN OR [place];  Service: Pulmonary    PR BRONCHOSCOPY,PLACEMENT FIDUCIAL MARKERS, 1/MULT N/A 11/21/2021    Procedure: BRONCHOSCOPY, RIGID OR FLEXIBLE, FLOURO WHEN PERFORMED; PLACEMENT OF FIDUCIAL MARKERS, SINGLE OR MULTIPLE;  Surgeon: Selinda Janina Sermon, MD;  Location: MAIN OR [place];  Service: Pulmonary    PR BRONCHOSCOPY,TRANSBRON ASPIR BX Right 11/21/2021    Procedure: BRONCHOSCOPY, RIGID/FLEX, INCL FLUORO; W/TRANSBRONCH NDL ASPIRAT BX, TRACHEA, MAIN STEM &/OR LOBAR BRONCHUS;  Surgeon: Selinda Janina Sermon, MD;  Location: MAIN OR [place];  Service: Pulmonary    PR BRONCHOSCOPY,TRANSBRONCH BIOPSY Right 11/21/2021    Procedure: BRONCHOSCOPY, RIGID/FLEXIBLE, INCLUDE FLUORO GUIDANCE WHEN PERFORMED; W/TRANSBRONCHIAL LUNG BX, SINGLE LOBE;  Surgeon: Selinda Janina Sermon, MD;  Location: MAIN OR ;  Service: Pulmonary    PR C-LAMINOPLASTY W/GRAFT/PLATE, 2 OR MORE N/A 11/06/2019    Procedure: LAMINOPLASTY, CERVICAL, DECOMPRESS SPINAL CORD, 2+ VERTEBRAL SEGMENTS; W/RECONSTRUCT POSTERIOR BONY ELEMENT;  Surgeon: Abram JONELLE Gartner, MD;  Location: [facility] OR [facility];  Service: Orthopedics    PR COLONOSCOPY W/BIOPSY SINGLE/MULTIPLE N/A 07/05/2017    Procedure: COLONOSCOPY, FLEXIBLE, PROXIMAL TO SPLENIC FLEXURE; WITH BIOPSY, SINGLE OR MULTIPLE;  Surgeon: Luke Cory Notch, MD;  [facility];  Service: Gastroenterology    PR IONM 1 ON 1 IN OR W/ATTENDANCE EACH 15 MINUTES N/A 11/06/2019    Procedure: CONTINUOUS INTRAOPERATIVE NEUROPHYSIOLOGY MONITORING IN OR;  Surgeon: Abram JONELLE Gartner, MD;  Location: [facility] OR [facility];  Service: Orthopedics    PR LAM W/O FACETEC FORAMOT/DSKC 1/2 VRT SEG, CERVICAL N/A 11/06/2019    Procedure: LAMINECT W/EXPLOR WO FACETECT 1-2 VERTEB; CERV;  Surgeon: Abram JONELLE Gartner, MD;  [facility] OR [facility];  Service: Orthopedics    PR POSTERIOR SEGMENTAL INSTRUMENTATION 3-6 VRT SEG N/A 11/06/2019    Procedure: POST SEGMT INSTRUM; 3 TO 6 VERTEB SEGMT CERVICAL;  Surgeon: Abram JONELLE Gartner, MD;  [facility]: [facility] OR [facility];  Service: Orthopedics    SINUS SURGERY     [3]   Family History  Problem Relation Age of Onset    Cancer Daughter     Strabismus Daughter     Hypertension Daughter     No Known Problems Mother     No Known Problems Father     No Known Problems Sister     No Known Problems Brother     No Known Problems Maternal Aunt     No Known Problems Maternal Uncle     No Known Problems Paternal Aunt     No Known Problems Paternal Uncle     No Known Problems Maternal Grandmother     No Known Problems Maternal Grandfather     No Known Problems Paternal Grandmother     No Known Problems Paternal Grandfather     Amblyopia Neg Hx     Blindness Neg Hx     Cataracts Neg Hx     Diabetes Neg Hx     Glaucoma Neg Hx     Macular degeneration Neg Hx     Retinal detachment Neg Hx     Stroke Neg Hx     Thyroid disease Neg Hx

## 2024-03-26 NOTE — Progress Notes (Addendum)
 Daily Progress Note    Assessment/Plan:    Principal Problem:    Pneumonia of left lung due to infectious organism  Active Problems:    Primary hypertension    Sensorineural hearing loss    Asthma-COPD overlap syndrome    (CMS-HCC)    GERD without esophagitis    Dry eye syndrome of bilateral lacrimal glands    Benign prostatic hyperplasia with incomplete bladder emptying                 Lee Baxter is a 79 y.o. male with PMHx notable for COPD who is presenting to [facility] with Pneumonia of left lung due to infectious organism, following a Flu A infection     LLL PNA, c/f bacterial superinfection , Influenza A infection, COPD exacerbation 2/2 above:  Sick with influenza likely since ~1/3. Felt better about a week in and then progressed, this and imaging is c/w superimposed bacterial PNA especially with delayed symptom onset.  Is feeling much but improved on antibiotics.  MRSA screen negative.  - Continue Cefepime, stop vancomycin with negative MRSA screen  - Wean O2 as tolerated, goal SpO2 88-92% given COPD  - Start Pred 40mg  x 5 days  - Duonebs q6h + albuterol  PRN additionally  - Hold Trelegy while on above meds, resume at discharge     Thrush-likely caused by steroids  -Nystatin  swish and swallow     Hypertension:  - Continue Amlodipine  2.5mg  daily     CKD3a:Cr stable at his baselien of 1.6  - Encourage PO hydration     B12 deficiency  - Continue PO supplementation 1000mcg daily     Chronic neck/back pain:  Prior C- spine surgery. Has known DJD. Not taking SNRI per PCP note and per patient  - Tylenol  PRN     DM2 c/b peripheral neuropathy0 Last Hb A1C 6.9   Well controlled outpatient with Metformin  500mg  BID  - Continue Metformin  as taking good PO and Cr at baseline  - Will add NPH 6 units twice daily along with sliding scale insulin  due to hyperglycemia while on steroids    Daily Checklist:  Diet: Regular Diet  DVT PPx: Heparin  5000mg  q8h   Code Status: DNR and DNI  Dispo: PT and OT, possible discharge home with home health on 115 pending clinical status    Lee Lee Gu, MD   Pager [phone]  ___________________________________________________________________    Subjective:  No acute events overnight.  He reports he feels almost 100% better compared to yesterday.  Still been a little bit of wheezing and shortness of breath but overall much improved.  Daughter updated via phone.    Labs/Studies:  Labs and Studies from the last 24hrs per EMR and Reviewed    Objective:  Temp:  [36.4 ??C (97.5 ??F)-37.4 ??C (99.3 ??F)] 37.4 ??C (99.3 ??F)  Pulse:  [80-107] 107  SpO2 Pulse:  [98] 98  Resp:  [16-19] 16  BP: (132-155)/(66-90) 138/66  SpO2:  [94 %-98 %] 94 %    GEN: NAD, lying in bed  EYES: EOMI, no conjunctival icterus or injection  ENT: MMM  CV: RRR, no murmurs heard  PULM: Crackles in bilateral lower lung fields, some wheezing heard throughout lung fields, normal work of breathing on nasal cannula  ABD: soft, NT/ND, +BS  EXT: No edema, extremities perfused

## 2024-03-26 NOTE — Consults (Signed)
 PHYSICAL THERAPY  Evaluation (03/26/24 1004)       Patient Name:  Lee Baxter       Medical Record Number: [id number]   Date of Birth: [birth year]  Sex: Male        Post-Discharge Physical Therapy Recommendations:  PT Post Acute Discharge Recommendations: 3x weekly (anticipate progression)     Equipment Recommendation  PT DME Recommendations: None                   ASSESSMENT  Problem List: Decreased endurance, Fall risk      Assessment : Lee Baxter is a 79 y.o. male with PMHx notable for COPD who is presenting to [facility] with Pneumonia of left lung due to infectious organism, following a Flu A infection. Pt reports being mod I at baseline, utilizing quad cane for ambulation. During today's PT evaluation, pt required SBA-mod I will all mobility, including functional transfers and short-distance ambulation. Pt preferred remaining in room for session so longer-distance ambulation was not assessed, however pt demonstrated no instability or LOB with use of quad cane. Pt may benefit from 1-2 PT sessions to assess longer distance ambulation quad cane vs RW (pt owns walker and feels it'll be safer at home) and stair negotiation to mimic home set up. PT recs are 3x/week but anticipate discharge with no skilled PT services needed. Pt reports grandson will be staying with him and provide 24/7 care.      Today's Interventions: Balance activities, Endurance activities, Gait training, Patient/Family/Caregiver Education, Therapeutic activity, Therapeutic exercise  Today's Interventions: PT eval, role and POC. education: AD, supervised activity, fall reduction, d/c recs, rehab aide purpose and role, quad cane vs RW, staff assistance for mobility, activity pacing     Personal Factors/Comorbidities Present: 3+ factors   Examination of Body systems: 4+ elements  Clinical Presentation: Evolving    Eval Complexity : Moderate Complexity     Activity Tolerance: Tolerated treatment well       PLAN  Planned Frequency of Treatment: Plan of Care Initiated: 03/26/24  1-2x per day Weekly Frequency: 2-3 days per week  Planned Treatment Duration: 04/02/24     Planned Interventions: Education (Patient/Family/Caregiver), Gait training, Home exercise program, Self-care / Home Management training, Therapeutic Exercise, Therapeutic Activity     Goals:         SHORT GOAL #1: Pt will ambulate 153ft with LRAD, mod I.               Time Frame : 1 week  SHORT GOAL #2: Pt will negotiate 5STE with LRAD and use of one HR, mod I.              Time Frame : 1 week                                         Long Term Goal #1: Pt will ambulate >349ft with LRAD, mod I.  Time Frame: 4 weeks     Prognosis:  Good     Barriers to Discharge: Endurance deficits     SUBJECTIVE  Communication Preference: Verbal     Patient reports: pt agreeable to PT.  Pain Comments: denies pain        Prior Functional Status: Pt reports being mod I with mobility, utilizing a quad cane for ambulation. Independent with house chores. Daughter lives across the street and assists with groceries,  however pt still drives. Pt reports one fall in the past 6 months, reports losing balance when bending down to pick up something.  Living Situation  Living Environment: Trailer/Mobile home  Lives With: Alone (daughter lives across the street)  Home Living: One level home, Stairs to enter with rails, Walk-in shower, Shower chair with back, Hand-held shower hose, Standard height toilet  Rail placement (outside): Bilateral rails in reach  Number of Stairs to Enter (outside): 5  Caregiver Identified?: Yes (reports grandson can stay with him and assist as needed)  Caregiver Availability: 24 hours  Caregiver Ability: Limited lifting  Caregiver Identified?: Yes (reports grandson can stay with him and assist as needed)   Equipment available at home: Quad cane, Oxygen, Rolling walker        Past Medical History[1]         Social History     Tobacco Use    Smoking status: Former     Current packs/day: 0.00     Average packs/day: 1 pack/day for 35.0 years (35.0 ttl pk-yrs)     Types: Cigarettes     Start date: 02/19/1953     Quit date: 08/12/1987     Years since quitting: 36.6    Smokeless tobacco: Current     Types: Chew   Substance Use Topics    Alcohol use: No     Alcohol/week: 0.0 standard drinks of alcohol       Past Surgical History[2]          Family History[3]     Allergies: Latex                  Objective Findings  Precautions / Restrictions  Precautions: Isolation precautions  Required Braces or Orthoses: Non-applicable        Equipment / Environment: Vascular access (PIV, TLC, Port-a-cath, PICC)     Vitals/Orthostatics : VSS per Epic. Start of session: BP 136/76, HR 115 bpm at rest. Asymptomatic througout session. SpO2 >95% on 2L [place].     Cognition: WFL  Orientation: Oriented x4  Visual/Perception: Wears Glasses/Contacts all the time  Hearing: Hearing aids not present     Skin Inspection: Intact where visualized     Upper Extremities  UE ROM: Right WFL, Left WFL  UE Strength: Right WFL, Left WFL    Lower Extremities  LE ROM: Right WFL, Left WFL  LE Strength: Right WFL, Left WFL          Sensation: Impaired  Posture: WFL  Motor/Sensory/Neuro Comments: intermittent numbness/tingling in B/L UEs with certain positions.    Static Sitting-Level of Assistance: Independent  Dynamic Sitting-Level of Assistance: Archivist Standing-Level of Assistance: Modified independent  Dynamic Standing - Level of Assistance: Stand by assistance, Modified independent  Standing Balance comments: dynamic standing with quad cane,  initially SBA but progressing to mod I. pt able to bend down to pick up item from floor with no LOB.      Bed Mobility        Supine to Sit assistance level: Independent          Transfers  Sit to Stand assistance level: Standby assist, set-up cues, supervision of patient - no hands on, Modified independent, requires aide device or extra time     Transfer comments: performed multiple STS throughout session from EOB and recliner, initially SBA for safety but progressing to mod I.    Ambulation  Level of Assistance: Modified independent, requires aide device or extra  time  Assistive Device: [employer] Ambulated (ft): 15 ft   Ambulation comments: pt ambulated ~63ft within room with quad cane, overall mod I with no instability or LOB noted. pt performed high knees to mimic going up stairs, no LOB or instability noted.       Stairs: not formerly ssessed           Endurance: slightly decreased    Patient at end of session: All needs in reach, In chair, Alarm activated            AM-PAC-6 click  Help currently need turning over In bed?: None - Modified Independent/Independent  Help currently needed sitting down/standing up from chair with arms? : None - Modified Independent/Independent  Help currently needed moving from supine to sitting on edge of bed?: None - Modified Independent/Independent  Help currently needed moving to and from bed from wheelchair?: None - Modified Independent/Independent  Help currently needed walking in a hospital room?: None - Modified Independent/Independent  Help currently needed climbing 3-5 steps with railing?: A Little - Minimal/Contact Guard Assist/Supervision    Basic Mobility Score 6 click: 23    6 click Score (in points): % of Functional Impairment, Limitation, Restriction  6: 100% impaired, limited, restricted  7-8: At least 80%, but less than 100% impaired, limited restricted  9-13: At least 60%, but less than 80% impaired, limited restricted  14-19: At least 40%, but less than 60% impaired, limited restricted  20-22: At least 20%, but less than 40% impaired, limited restricted  23: At least 1%, but less than 20% impaired, limited restricted  24: 0% impaired, limited restricted    'AM-PAC' forms are Copyright protected by [employer]     Physical Therapy Session Duration  PT Individual [mins]: 8  PT Co-Treatment [mins]: 39    I attest that I have reviewed the above information.  Signed: Greig Stallion, PT  Filed 03/26/2024        [1]   Past Medical History:  Diagnosis Date    Adenomatous colon polyp     Aspiration pneumonitis    (CMS-HCC) 06/29/2022    Asthma (HHS-HCC)     Cervical disc disorder 04/17/2013    cervical spine MRI from February 2014 discharge DJD and foraminal stenosis at C4- 5, C3- 4 and C6- 7.     Cervical stenosis of spinal canal 11/06/2019    COPD (chronic obstructive pulmonary disease) (CMS-HCC)     Diabetes mellitus (CMS-HCC) Dx 1990    Type II    Diverticulosis     Hemorrhoids, internal     HL (hearing loss)     Hyperkalemia 07/31/2023    Hypertension     Lung cancer    (CMS-HCC) 1992    Pleural effusion, right 06/25/2022    PVD (posterior vitreous detachment), left eye     Sinusitis     Temporomandibular joint disorders 01/15/2012   [2]   Past Surgical History:  Procedure Laterality Date    CATARACT EXTRACTION W/ INTRAOCULAR LENS IMPLANT Left 02/13/2006    Hecker MD    CATARACT EXTRACTION W/ INTRAOCULAR LENS IMPLANT Right 03/27/2006    Hecker MD    CHOLECYSTECTOMY      HERNIA REPAIR      KIDNEY STONE SURGERY      LUNG LOBECTOMY Left 1992    PR ALLOGRAFT FOR SPINE SURGERY ONLY MORSELIZED N/A 11/06/2019    Procedure: ALLOGRAFT FOR SPINE SURGERY ONLY; MORSELIZED;  Surgeon: Abram  JONELLE Gartner, MD;  Location[facility] OR [facility];  Service: Orthopedics    PR ARTHRD PST/PSTLAT TQ 1NTRSPC CRV BELW C2 SEGMENT N/A 11/06/2019    Procedure: ARTHRODESIS, POSTERIOR OR POSTEROLATERAL TECHNIQUE, SINGLE LEVEL; CERVICAL BELOW C2 SEGMENT;  Surgeon: Abram JONELLE Gartner, MD;  Location: [facility] OR [facility];  Service: Orthopedics    PR ARTHRODESIS PST/PSTLAT TQ 1NTRSPC EA ADDL NTRSPC N/A 11/06/2019    Procedure: ARTHRODESIS, POSTERIOR OR POSTEROLATERAL TECHNIQUE, SINGLE LEVEL; EACH ADDITIONAL VERTEBRAL SEGMENT   x4;  Surgeon: Abram JONELLE Gartner, MD;  Location: [facility] OR [facility];  Service: Orthopedics    PR AUTOGRAFT SPINE SURGERY LOCAL FROM SAME INCISION N/A 11/06/2019    Procedure: AUTOGRAFT/SPINE SURG ONLY (W/HARVEST GRAFT); LOCAL (EG, RIB/SPINOUS PROC, LAM FRGMT) OBTAIN FROM SAME INCIS;  Surgeon: Abram JONELLE Gartner, MD;  Location: [name] OR [facility];  Service: Orthopedics    PR BRNSCHSC TNDSC EBUS DX/TX INTERVENTION [facility] LES Right 11/21/2021    Procedure: BRONCH, RIGID OR FLEXIBLE, INCLUDING FLUORO GUIDANCE, WHEN PERFORMED; WITH TRANSENDOSCOPIC EBUS DURING BRONCHOSCOPIC DIAGNOSTIC OR THERAPEUTIC INTERVENTION(S) FOR PERIPHERAL LESION(S);  Surgeon: Selinda Janina Sermon, MD;  Location: MAIN OR [place];  Service: Pulmonary    PR BRONCHOSCOPY,COMPUTER ASSIST/IMAGE-GUIDED NAVIGATION N/A 11/21/2021    Procedure: ROBOT ION BRONCHOSCOPY,RIGID OR FLEXIBLE,INCLUDE FLUORO WHEN PERFORMED; W/COMPUTER-ASSIST,IMAGE-GUIDED NAVIGATION;  Surgeon: Selinda Janina Sermon, MD;  Location: MAIN OR [place];  Service: Pulmonary    PR BRONCHOSCOPY,COMPUTER ASSIST/IMAGE-GUIDED NAVIGATION N/A 11/21/2021    Procedure: BRONCHOSCOPY, RIGID OR FLEXIBLE, INCLUDE FLUORO WHEN PERFORMED; W/COMPUTER-ASSIST, IMAGE-GUIDED NAVIGATION;  Surgeon: Selinda Janina Sermon, MD;  Location: MAIN OR [place];  Service: Pulmonary    PR BRONCHOSCOPY,DIAGNOSTIC W LAVAGE Right 06/27/2022    Procedure: BRONCHOSCOPY, RIGID OR FLEXIBLE, INCLUDE FLUOROSCOPIC GUIDANCE WHEN PERFORMED; W/BRONCHIAL ALVEOLAR LAVAGE;  Surgeon: Blondie Ronal Anette Arlean, MD;  Location: MAIN OR [place];  Service: Pulmonary    PR BRONCHOSCOPY,PLACEMENT FIDUCIAL MARKERS, 1/MULT N/A 11/21/2021    Procedure: BRONCHOSCOPY, RIGID OR FLEXIBLE, FLOURO WHEN PERFORMED; PLACEMENT OF FIDUCIAL MARKERS, SINGLE OR MULTIPLE;  Surgeon: Selinda Janina Sermon, MD;  Location: MAIN OR [place];  Service: Pulmonary    PR BRONCHOSCOPY,TRANSBRON ASPIR BX Right 11/21/2021    Procedure: BRONCHOSCOPY, RIGID/FLEX, INCL FLUORO; W/TRANSBRONCH NDL ASPIRAT BX, TRACHEA, MAIN STEM &/OR LOBAR BRONCHUS;  Surgeon: Selinda Janina Sermon, MD;  Location: MAIN OR [place];  Service: Pulmonary    PR BRONCHOSCOPY,TRANSBRONCH BIOPSY Right 11/21/2021 Procedure: BRONCHOSCOPY, RIGID/FLEXIBLE, INCLUDE FLUORO GUIDANCE WHEN PERFORMED; W/TRANSBRONCHIAL LUNG BX, SINGLE LOBE;  Surgeon: Selinda Janina Sermon, MD;  Location: MAIN OR [place];  Service: Pulmonary    PR C-LAMINOPLASTY W/GRAFT/PLATE, 2 OR MORE N/A 11/06/2019    Procedure: LAMINOPLASTY, CERVICAL, DECOMPRESS SPINAL CORD, 2+ VERTEBRAL SEGMENTS; W/RECONSTRUCT POSTERIOR BONY ELEMENT;  Surgeon: Abram JONELLE Gartner, MD;  Location: [facility] OR [facility];  Service: Orthopedics    PR COLONOSCOPY W/BIOPSY SINGLE/MULTIPLE N/A 07/05/2017    Procedure: COLONOSCOPY, FLEXIBLE, PROXIMAL TO SPLENIC FLEXURE; WITH BIOPSY, SINGLE OR MULTIPLE;  Surgeon: Luke Cory Notch, MD;  [facility];  Service: Gastroenterology    PR IONM 1 ON 1 IN OR W/ATTENDANCE EACH 15 MINUTES N/A 11/06/2019    Procedure: CONTINUOUS INTRAOPERATIVE NEUROPHYSIOLOGY MONITORING IN OR;  Surgeon: Abram JONELLE Gartner, MD;  Location: [facility] OR [facility];  Service: Orthopedics    PR LAM W/O FACETEC FORAMOT/DSKC 1/2 VRT SEG, CERVICAL N/A 11/06/2019    Procedure: LAMINECT W/EXPLOR WO FACETECT 1-2 VERTEB; CERV;  Surgeon: Abram JONELLE Gartner, MD;  [facility] OR [facility];  Service: Orthopedics    PR  POSTERIOR SEGMENTAL INSTRUMENTATION 3-6 VRT SEG N/A 11/06/2019    Procedure: POST SEGMT INSTRUM; 3 TO 6 VERTEB SEGMT CERVICAL;  Surgeon: Abram JONELLE Gartner, MD;  Location: [facility] OR [facility];  Service: Orthopedics    SINUS SURGERY     [3]   Family History  Problem Relation Age of Onset    Cancer Daughter     Strabismus Daughter     Hypertension Daughter     No Known Problems Mother     No Known Problems Father     No Known Problems Sister     No Known Problems Brother     No Known Problems Maternal Aunt     No Known Problems Maternal Uncle     No Known Problems Paternal Aunt     No Known Problems Paternal Uncle     No Known Problems Maternal Grandmother     No Known Problems Maternal Grandfather     No Known Problems Paternal Grandmother     No Known Problems Paternal Grandfather Amblyopia Neg Hx     Blindness Neg Hx     Cataracts Neg Hx     Diabetes Neg Hx     Glaucoma Neg Hx     Macular degeneration Neg Hx     Retinal detachment Neg Hx     Stroke Neg Hx     Thyroid disease Neg Hx

## 2024-03-26 NOTE — Consults (Signed)
 Care Management  Initial Transition Planning Assessment    Patient lives alone in [facility]) in a 1 level home with 5 steps to enter. At baseline patient is independent with ADLs. Patient does not use home health services. DME is 4-point cane. Daughter, Macario and Darden Bruckner will provide transportation and will assist with basic care at home.              General  Care Manager / Social Worker assessed the patient by : In person interview with patient, Telephone conversation with family (Daughter, Macario)  Orientation Level: Oriented X4  Functional level prior to admission: Independent  Reason for referral: Discharge Planning    Contact/Decision Maker  Extended Emergency Contact Information  Primary Emergency Contact: Debarah Jyl Macario  Address: [address] [facility] [address]           [place], [place] [address] [place]  Mobile Phone: [phone]  Relation: Daughter    Legal Next of Kin / Guardian / POA / Advance Directives     HCDM (patient stated preference): Debarah Jyl Macario - Daughter - [phone]    Advance Directive (Medical Treatment)  Does patient have an advance directive covering medical treatment?: Patient has advance directive covering medical treatment, copy not in chart.  Advance directive covering medical treatment not in Chart:: Copy requested from family    Health Care Decision Maker [HCDM] (Medical & Mental Health Treatment)  Healthcare Decision Maker: HCDM documented in the HCDM/Contact Info section.  Information offered on HCDM, Medical & Mental Health advance directives:: Patient declined information.    Advance Directive (Mental Health Treatment)  Does patient have an advance directive covering mental health treatment?: Patient does not have advance directive covering mental health treatment.  Reason patient does not have an advance directive covering mental health treatment:: Patient does not wish to complete one at this time.    Readmission Information    Have you been hospitalized in the last 30 days?: No     Did the following happen with your discharge?      Patient Information  Lives with: Alone    Type of Residence: Private residence        Location/Detail: 1 Level home with 5 steps into residence.    Support Systems/Concerns: Case Manager/Social Worker    Responsibilities/Dependents at home?: No    Home Care services in place prior to admission?: No       Equipment Currently Used at Home: cane, quad, oxygen  Current HME Agency (Name/Phone #): Use 2L @ home intermittently (Patient and daughter think they use [employer], will follow up to confirm)    Currently receiving outpatient dialysis?: No    Pharmacy: [facility] [address] [place] [address]    PCP: Sharrell Feinstein, MD       Financial Information       Need for financial assistance?: No       Social Drivers of Health  Social Drivers of Health     Food Insecurity: No Food Insecurity (03/26/2024)    Hunger Vital Sign     Worried About Running Out of Food in the Last Year: Never true     Ran Out of Food in the Last Year: Never true   Tobacco Use: High Risk (03/24/2024)    Patient History     Smoking Tobacco Use: Former     Smokeless Tobacco Use: Current     Passive Exposure: Not on file   Transportation Needs: No Transportation Needs (03/26/2024)  PRAPARE - Therapist, Art (Medical): No     Lack of Transportation (Non-Medical): No   Alcohol Use: Not At Risk (07/30/2023)    Alcohol Use     How often do you have a drink containing alcohol?: Never     How many drinks containing alcohol do you have on a typical day when you are drinking?: 1 - 2     How often do you have 5 or more drinks on one occasion?: Never   Housing: Low Risk (03/26/2024)    Housing     Within the past 12 months, have you ever stayed: outside, in a car, in a tent, in an overnight shelter, or temporarily in someone else's home (i.e. couch-surfing)?: No     Are you worried about losing your housing?: No   Physical Activity: Not on file   Utilities: Low Risk (03/26/2024)    Utilities     Within the past 12 months, have you been unable to get utilities (heat, electricity) when it was really needed?: No   Stress: Not on file   Interpersonal Safety: Not At Risk (03/24/2024)    Interpersonal Safety     Unsafe Where You Currently Live: No     Physically Hurt by Anyone: No     Abused by Anyone: No   Substance Use: Low Risk (07/30/2023)    Substance Use     In the past year, how often have you used prescription drugs for non-medical reasons?: Never     In the past year, how often have you used illegal drugs?: Never     In the past year, have you used any substance for non-medical reasons?: No   Intimate Partner Violence: Not At Risk (07/30/2023)    Humiliation, Afraid, Rape, and Kick questionnaire     Fear of Current or Ex-Partner: No     Emotionally Abused: No     Physically Abused: No     Sexually Abused: No   Social Connections: Not on file   Financial Resource Strain: Low Risk (03/26/2024)    Overall Financial Resource Strain (CARDIA)     Difficulty of Paying Living Expenses: Not hard at all   Health Literacy: Low Risk (04/30/2023)    Health Literacy     : Never   Internet Connectivity: No Internet connectivity concern identified (04/30/2023)    Internet Connectivity     Do you have access to internet services: Yes     How do you connect to the internet: Personal Device at home     Is your internet connection strong enough for you to watch video on your device without major problems?: Yes     Do you have enough data to get through the month?: Yes     Does at least one of the devices have a camera that you can use for video chat?: Yes       Complex Discharge Information    Is patient identified as a difficult/complex discharge?: No    Interventions:       Discharge Needs Assessment  Concerns to be Addressed: discharge planning    Clinical Risk Factors: > 65, Multiple Diagnoses (Chronic), Lives Alone or Absence of Caregiver to Assist with Discharge and Home Care    Barriers to taking medications: No    Prior overnight hospital stay or ED visit in last 90 days: No         Patient's Choice of Community Agency(s): No preference  stated.    Anticipated Changes Related to Illness: other (see comments) (Likely will need time to recover to resume prior level of functioning.)    Equipment Needed After Discharge: other (see comments) (CM will follow for DME needs.)    Discharge Facility/Level of Care Needs: other (see comments) (CM will follow for DC needs.)    Readmission  Risk of Unplanned Readmission Score: UNPLANNED READMISSION SCORE: 27.35%  Predictive Model Details          27% (High)  Factor Value    Calculated 03/26/2024 08:05 19% Number of active inpatient medication orders 35    [place] Risk of Unplanned Readmission Model 16% Diagnosis of drug abuse present     8% Diagnosis of cancer present     7% ECG/EKG order present in last 6 months     6% Latest BUN high (33 mg/dL)     5% Imaging order present in last 6 months     5% Age 50     5% Phosphorous result present     5% Charlson Comorbidity Index 5     4% Number of ED visits in last six months 1     4% Number of hospitalizations in last year 1     3% Active anticoagulant inpatient medication order present     3% Active corticosteroid inpatient medication order present     3% Latest creatinine high (1.56 mg/dL)     3% Diagnosis of renal failure present     2% Future appointment scheduled     1% Current length of stay 0.712 days      Readmitted Within the Last 30 Days? (No if blank)   Patient at risk for readmission?: No    Discharge Plan  Screen findings are: Discharge planning needs identified or anticipated (Comment). (CM will follow for DC needs.)    Expected Discharge Date:     Expected Transfer from Critical Care:      Quality data for continuing care services shared with patient and/or representative?: N/A  Patient and/or family were provided with choice of facilities / services that are available and appropriate to meet post hospital care needs?: Other (Comment) (CM will provide choice of facilities/services if deemed medically necessary.)       Initial Assessment complete?: Yes

## 2024-03-27 LAB — BASIC METABOLIC PANEL
ANION GAP: 15 mmol/L — ABNORMAL HIGH (ref 5–14)
BLOOD UREA NITROGEN: 29 mg/dL — ABNORMAL HIGH (ref 9–23)
BUN / CREAT RATIO: 20
CALCIUM: 9.4 mg/dL (ref 8.7–10.4)
CHLORIDE: 105 mmol/L (ref 98–107)
CO2: 22 mmol/L (ref 20.0–31.0)
CREATININE: 1.46 mg/dL — ABNORMAL HIGH (ref 0.73–1.18)
EGFR CKD-EPI (2021) MALE: 48 mL/min/1.73m2 — ABNORMAL LOW (ref >=60)
GLUCOSE RANDOM: 228 mg/dL — ABNORMAL HIGH (ref 70–179)
POTASSIUM: 4.2 mmol/L (ref 3.4–4.8)
SODIUM: 142 mmol/L (ref 135–145)

## 2024-03-27 LAB — CBC
HEMATOCRIT: 36 % — ABNORMAL LOW (ref 39.0–48.0)
HEMOGLOBIN: 12 g/dL — ABNORMAL LOW (ref 12.9–16.5)
MEAN CORPUSCULAR HEMOGLOBIN CONC: 33.4 g/dL (ref 32.0–36.0)
MEAN CORPUSCULAR HEMOGLOBIN: 31 pg (ref 25.9–32.4)
MEAN CORPUSCULAR VOLUME: 92.9 fL (ref 77.6–95.7)
MEAN PLATELET VOLUME: 7.8 fL (ref 6.8–10.7)
PLATELET COUNT: 361 10*9/L (ref 150–450)
RED BLOOD CELL COUNT: 3.87 10*12/L — ABNORMAL LOW (ref 4.26–5.60)
RED CELL DISTRIBUTION WIDTH: 14.6 % (ref 12.2–15.2)
WBC ADJUSTED: 15.5 10*9/L — ABNORMAL HIGH (ref 3.6–11.2)

## 2024-03-27 LAB — MAGNESIUM: MAGNESIUM: 1.7 mg/dL (ref 1.6–2.6)

## 2024-03-27 MED ORDER — NYSTATIN 100,000 UNIT/ML ORAL SUSPENSION
Freq: Four times a day (QID) | ORAL | 0 refills | 3.00000 days | Status: CP
Start: 2024-03-27 — End: 2024-03-30
  Filled 2024-03-27: qty 60, 3d supply, fill #0

## 2024-03-27 MED ADMIN — ipratropium-albuterol (DUO-NEB) 0.5-2.5 mg/3 mL nebulizer solution 3 mL: 3 mL | RESPIRATORY_TRACT | @ 18:00:00 | Stop: 2024-03-27

## 2024-03-27 MED ADMIN — ipratropium-albuterol (DUO-NEB) 0.5-2.5 mg/3 mL nebulizer solution 3 mL: 3 mL | RESPIRATORY_TRACT | @ 14:00:00 | Stop: 2024-03-27

## 2024-03-27 MED ADMIN — ipratropium-albuterol (DUO-NEB) 0.5-2.5 mg/3 mL nebulizer solution 3 mL: 3 mL | RESPIRATORY_TRACT | @ 02:00:00

## 2024-03-27 MED ADMIN — oseltamivir (TAMIFLU) capsule 30 mg: 30 mg | ORAL | @ 02:00:00 | Stop: 2024-03-30

## 2024-03-27 MED ADMIN — oseltamivir (TAMIFLU) capsule 30 mg: 30 mg | ORAL | @ 14:00:00 | Stop: 2024-03-27

## 2024-03-27 MED ADMIN — insulin lispro (HumaLOG) injection CORRECTIONAL 0-20 Units: 0-20 [IU] | SUBCUTANEOUS | @ 12:00:00 | Stop: 2024-03-27

## 2024-03-27 MED ADMIN — insulin lispro (HumaLOG) injection CORRECTIONAL 0-20 Units: 0-20 [IU] | SUBCUTANEOUS | @ 03:00:00

## 2024-03-27 MED ADMIN — insulin lispro (HumaLOG) injection CORRECTIONAL 0-20 Units: 0-20 [IU] | SUBCUTANEOUS | @ 18:00:00 | Stop: 2024-03-27

## 2024-03-27 MED ADMIN — cefepime (MAXIPIME) 2 g in sodium chloride 0.9 % (NS) 100 mL IVPB-MBP: 2 g | INTRAVENOUS | @ 02:00:00 | Stop: 2024-03-27

## 2024-03-27 MED ADMIN — cefepime (MAXIPIME) 2 g in sodium chloride 0.9 % (NS) 100 mL IVPB-MBP: 2 g | INTRAVENOUS | @ 14:00:00 | Stop: 2024-03-27

## 2024-03-27 MED ADMIN — heparin (porcine) 5,000 unit/mL injection 5,000 Units: 5000 [IU] | SUBCUTANEOUS | @ 02:00:00

## 2024-03-27 MED ADMIN — heparin (porcine) 5,000 unit/mL injection 5,000 Units: 5000 [IU] | SUBCUTANEOUS | @ 11:00:00 | Stop: 2024-03-27

## 2024-03-27 MED ADMIN — heparin (porcine) 5,000 unit/mL injection 5,000 Units: 5000 [IU] | SUBCUTANEOUS | @ 20:00:00 | Stop: 2024-03-27

## 2024-03-27 MED ADMIN — amlodipine (NORVASC) tablet 2.5 mg: 2.5 mg | ORAL | @ 14:00:00 | Stop: 2024-03-27

## 2024-03-27 MED ADMIN — cyanocobalamin (vitamin B-12) tablet 1,000 mcg: 1000 ug | ORAL | @ 14:00:00 | Stop: 2024-03-27

## 2024-03-27 MED ADMIN — metFORMIN (GLUCOPHAGE) tablet 500 mg: 500 mg | ORAL | @ 14:00:00 | Stop: 2024-03-27

## 2024-03-27 MED ADMIN — insulin NPH (HumuLIN,NovoLIN) injection 6 Units: .2 [IU]/kg/d | SUBCUTANEOUS | @ 03:00:00

## 2024-03-27 MED ADMIN — insulin NPH (HumuLIN,NovoLIN) injection 6 Units: .2 [IU]/kg/d | SUBCUTANEOUS | @ 14:00:00 | Stop: 2024-03-27

## 2024-03-27 MED ADMIN — nystatin (MYCOSTATIN) powder 1 Application: 1 | TOPICAL | @ 03:00:00

## 2024-03-27 MED ADMIN — nystatin (MYCOSTATIN) powder 1 Application: 1 | TOPICAL | @ 14:00:00 | Stop: 2024-03-27

## 2024-03-27 MED ADMIN — nystatin (MYCOSTATIN) oral suspension: 500000 [IU] | ORAL | @ 18:00:00 | Stop: 2024-03-27

## 2024-03-27 MED ADMIN — nystatin (MYCOSTATIN) oral suspension: 500000 [IU] | ORAL | @ 02:00:00

## 2024-03-27 MED ADMIN — nystatin (MYCOSTATIN) oral suspension: 500000 [IU] | ORAL | @ 11:00:00 | Stop: 2024-03-27

## 2024-03-27 MED ADMIN — atorvastatin (LIPITOR) tablet 40 mg: 40 mg | ORAL | @ 02:00:00

## 2024-03-27 MED ADMIN — tamsulosin (FLOMAX) 24 hr capsule 0.4 mg: .4 mg | ORAL | @ 14:00:00 | Stop: 2024-03-27

## 2024-03-27 MED ADMIN — tamsulosin (FLOMAX) 24 hr capsule 0.4 mg: .4 mg | ORAL | @ 02:00:00

## 2024-03-27 MED ADMIN — doxycycline (VIBRA-TABS) tablet 100 mg: 100 mg | ORAL | @ 22:00:00 | Stop: 2024-03-27

## 2024-03-27 MED ADMIN — finasteride (PROSCAR) tablet 5 mg: 5 mg | ORAL | @ 14:00:00 | Stop: 2024-03-27

## 2024-03-27 MED ADMIN — cefdinir (OMNICEF) capsule 300 mg: 300 mg | ORAL | @ 22:00:00 | Stop: 2024-03-27

## 2024-03-27 MED ADMIN — predniSONE (DELTASONE) tablet 40 mg: 40 mg | ORAL | @ 14:00:00 | Stop: 2024-03-27

## 2024-03-27 NOTE — Plan of Care (Signed)
 Patient discharged this afternoon per order. Patients daughter came to pick him up, elected to not wait for patient transport. Medications and follow up care discussed, no questions at time of discharge.   Problem: Adult Inpatient Plan of Care  Goal: Absence of Hospital-Acquired Illness or Injury  Outcome: Discharged to Home  Intervention: Identify and Manage Fall Risk  Recent Flowsheet Documentation  Taken 03/27/2024 0830 by Milinda Clotilda HERO, RN  Safety Interventions:   low bed   lighting adjusted for tasks/safety   fall reduction program maintained  Intervention: Prevent Skin Injury  Recent Flowsheet Documentation  Taken 03/27/2024 1015 by Milinda Clotilda HERO, RN  Positioning for Skin: Left  Taken 03/27/2024 0830 by Milinda Clotilda HERO, RN  Positioning for Skin: Supine/Back  Device Skin Pressure Protection: adhesive use limited  Skin Protection: adhesive use limited  Goal: Optimal Comfort and Wellbeing  Outcome: Discharged to Home  Goal: Readiness for Transition of Care  Outcome: Discharged to Home  Goal: Rounds/Family Conference  Outcome: Discharged to Home

## 2024-03-27 NOTE — Plan of Care (Signed)
 Patient is here for SOB.      Individualized Goals and Progression:    VSS     Assessment: Pt aox4. Pt has no complaints at this time.      Shift Vitals: Stable                      Vitals Exceptions: None      Patient Safety: Fall Precautions, Arm band on, call light within reach, bed in a low locked position, minimum of 2/4 side rails up, non-skid footwear worn and encouraged. Will give report to oncoming nurse to continue continuity of care.      Problem: Adult Inpatient Plan of Care  Goal: Absence of Hospital-Acquired Illness or Injury  Intervention: Identify and Manage Fall Risk  Recent Flowsheet Documentation  Taken 03/26/2024 2115 by Liberty Lye, RN  Safety Interventions:   fall reduction program maintained   lighting adjusted for tasks/safety   low bed   nonskid shoes/slippers when out of bed  Intervention: Prevent Skin Injury  Recent Flowsheet Documentation  Taken 03/26/2024 2115 by Liberty Lye, RN  Positioning for Skin: Left  Device Skin Pressure Protection: adhesive use limited  Skin Protection: adhesive use limited     Problem: Infection  Goal: Absence of Infection Signs and Symptoms  Intervention: Prevent or Manage Infection  Recent Flowsheet Documentation  Taken 03/26/2024 2115 by Liberty Lye, RN  Isolation Precautions: droplet precautions maintained     Problem: Fall Injury Risk  Goal: Absence of Fall and Fall-Related Injury  Intervention: Promote Injury-Free Environment  Recent Flowsheet Documentation  Taken 03/26/2024 2115 by Liberty Lye, RN  Safety Interventions:   fall reduction program maintained   lighting adjusted for tasks/safety   low bed   nonskid shoes/slippers when out of bed     Problem: Skin Injury Risk Increased  Goal: Skin Health and Integrity  Intervention: Optimize Skin Protection  Recent Flowsheet Documentation  Taken 03/26/2024 2115 by Liberty Lye, RN  Pressure Reduction Techniques: frequent weight shift encouraged  Pressure Reduction Devices: pressure-redistributing mattress utilized  Skin Protection: adhesive use limited     Problem: Wound  Goal: Absence of Infection Signs and Symptoms  Intervention: Prevent or Manage Infection  Recent Flowsheet Documentation  Taken 03/26/2024 2115 by Liberty Lye, RN  Isolation Precautions: droplet precautions maintained  Goal: Skin Health and Integrity  Intervention: Optimize Skin Protection  Recent Flowsheet Documentation  Taken 03/26/2024 2115 by Liberty Lye, RN  Pressure Reduction Techniques: frequent weight shift encouraged  Pressure Reduction Devices: pressure-redistributing mattress utilized  Skin Protection: adhesive use limited

## 2024-03-28 DIAGNOSIS — Z09 Encounter for follow-up examination after completed treatment for conditions other than malignant neoplasm: Principal | ICD-10-CM

## 2024-03-28 MED ORDER — PREDNISONE 20 MG TABLET
ORAL_TABLET | Freq: Every day | ORAL | 0 refills | 2.00000 days | Status: CP
Start: 2024-03-28 — End: 2024-03-30
  Filled 2024-03-27: qty 4, 2d supply, fill #0

## 2024-03-28 MED ORDER — CEFDINIR 300 MG CAPSULE
ORAL_CAPSULE | Freq: Two times a day (BID) | ORAL | 0 refills | 2.00000 days | Status: CP
Start: 2024-03-28 — End: 2024-03-30
  Filled 2024-03-27: qty 4, 2d supply, fill #0

## 2024-03-28 MED ORDER — DOXYCYCLINE HYCLATE 100 MG TABLET
ORAL_TABLET | Freq: Two times a day (BID) | ORAL | 0 refills | 2.00000 days | Status: CP
Start: 2024-03-28 — End: 2024-03-30
  Filled 2024-03-27: qty 4, 2d supply, fill #0

## 2024-03-28 NOTE — Discharge Summary (Signed)
 Physician Discharge Summary [facility]  [facility]  [address]  [place] [address]  Dept: [phone]  Loc: [phone]     Identifying Information:   Lee Baxter  30-Jan-1946  [id number]    Primary Care Physician: Manuela Sharrell LABOR, MD   Code Status: DNR and DNI    Admit Date: 03/24/2024    Discharge Date: 03/27/2024    Discharge To: Home with Home Health and/or PT/OT    Discharge Service: [facility]     Discharge Attending Physician: No att. providers found    Discharge Diagnoses:  Principal Problem:    Pneumonia of left lung due to infectious organism (POA: Yes)  Active Problems:    Primary hypertension (POA: Yes)    Sensorineural hearing loss (POA: Yes)    Asthma-COPD overlap syndrome    (CMS-HCC) (POA: Yes)    GERD without esophagitis (POA: Yes)    Dry eye syndrome of bilateral lacrimal glands (POA: Yes)    Benign prostatic hyperplasia with incomplete bladder emptying (POA: Yes)  Resolved Problems:    * No resolved hospital problems. *      Outpatient Provider Follow Up Issues:   [ ]  Monitor for improvement of symptoms after treatment for COPD and CAP    Hospital Course:   Lee Baxter is an 79 year old male with a history of COPD, asthma, hypertension, type 2 diabetes, lung cancer status post left lobectomy and radiation, and sensorineural hearing loss who was admitted with left lower lobe pneumonia following recent influenza A infection, presenting with worsening dyspnea, weakness, and cough.    Pneumonia of Left Lung (Bacterial Superinfection Post-Influenza) and COPD Exacerbation, history of asthma COPD overlap  He presented with 10 days of flu-like symptoms, followed by worsening dyspnea and weakness. Imaging revealed a left lower lobe infiltrate consistent with pneumonia, and he was started on cefepime  and vancomycin  due to concern pressure. Vancomycin  was discontinued after a negative MRSA screen. He required supplemental oxygen (up to 4L for desaturation events, then weaned to 2L), and was treated with systemic steroids and scheduled nebulized bronchodilators. By discharge, he reported significant improvement in respiratory symptoms and was able to ambulate with a quad cane,  maintaining adequate saturations at rest and with exertion for short distances.  He already has home oxygen from prior instructed to use oxygen as needed for longer ambulation and monitor pulse ox.  He was discharged with home health services arranged.  The patient had no history of Pseudomonas infection, was discharged with cefdinir  and doxycycline  and prednisone  to complete treatment for community-acquired pneumonia and COPD flare.  He has nebulizer treatments at home and was instructed to use as needed for ongoing    Hyperglycemia   Developed hyperglycemia in the setting of steroid use.  Correctional insulin  and NPH were initiated in addition to his home metformin .  In only 2 additional days will be needed, was not sent home with insulin  on discharge and will resume prior regimen.    Skin Integrity: Wounds and Moisture-Associated Skin Damage   He was noted to have bilateral groin consistent with a fungal rash, likely exacerbated by prior urinary incontinence. antifungal powder was applied to the groin. Both wounds were stable at discharge, with no evidence of infection or new skin breakdown.     Discharge Planning   He was discharged home with home health services for continued physical and occupational therapy, wound care, and monitoring of respiratory and functional status. His daughter and grandson will  provide additional support at home. He has an advance directive in place and is DNR/DNI.     Procedures:  No admission procedures for hospital encounter.  ______________________________________________________________________  Discharge Medications:     Your Medication List        START taking these medications      cefdinir  300 MG capsule  Commonly known as: OMNICEF   Take 1 capsule (300 mg total) by mouth every twelve (12) hours for 2 days.     doxycycline  100 MG tablet  Commonly known as: VIBRA -TABS  Take 1 tablet (100 mg total) by mouth two (2) times a day for 2 days.     nystatin  100,000 unit/mL suspension  Commonly known as: MYCOSTATIN   Take 5 mL (500,000 Units total) by mouth four (4) times a day for 3 days.     predniSONE  20 MG tablet  Commonly known as: DELTASONE   Take 2 tablets (40 mg total) by mouth in the morning for 2 days.            CONTINUE taking these medications      ACCU-CHEK GUIDE TEST STRIPS Strp  Generic drug: blood sugar diagnostic  Use to check blood sugar three times daily as instructed by clinic. E11.9     albuterol  90 mcg/actuation inhaler  Commonly known as: PROVENTIL  HFA;VENTOLIN  HFA  TAKE 2 PUFFS BY MOUTH EVERY 6 HOURS AS NEEDED FOR WHEEZE     amlodipine  2.5 MG tablet  Commonly known as: NORVASC   Take 1 tablet (2.5 mg total) by mouth daily.     atorvastatin  40 MG tablet  Commonly known as: LIPITOR   TAKE 1 TABLET BY MOUTH EVERY DAY IN THE EVENING     blood-glucose meter kit  Disp. blood glucose meter kit preferred by patient's insurance. Dx: Diabetes, E11.9     CYANOCOBALAMIN  (VITAMIN B-12) ORAL  Take 1 tablet/capsule by mouth daily.     DUPIXENT  PEN 300 mg/2 mL pen injector  Generic drug: dupilumab   Inject the contents of 1 pen (300 mg) under the skin every fourteen (14) days.     empty container Misc  Use as directed to dispose of Dupixent  pens     EPINEPHrine  0.3 mg/0.3 mL injection  Commonly known as: EPIPEN   Inject 0.3 mL (0.3 mg total) into the muscle once as needed for anaphylaxis for up to 1 dose.     finasteride  5 mg tablet  Commonly known as: PROSCAR   Take 1 tablet (5 mg total) by mouth daily.     ipratropium-albuterol  0.5-2.5 mg/3 mL nebulizer  Commonly known as: DUO-NEB  Inhale 1 vial ( 3 mL) by nebulization every six (6) hours. Use as directed     lancets Misc  Disp. lancets #100 or amount allowed, Testing BID. Dx: E11.9,Z79.4 (DM Type 2- controlled, insulin  dep)     MEDICAL SUPPLY ITEM  Pt has DM hx of neuropathy w/ callus. Followed for comprehensive care. For med records, fax release to [phone].     MEDICAL SUPPLY ITEM  1 pair of diabetic shoes and 3 pairs of inserts.  Pt has DM with neuropathy w/ callus. Followed for comprehensive care. For med records, fax release to [phone]     metFORMIN  500 MG tablet  Commonly known as: GLUCOPHAGE   Take 1 tablet (500 mg total) by mouth in the morning and 1 tablet (500 mg total) in the evening. Take with meals.     sodium chloride  3 % NEBULIZER solution  Inhale 1 vial ( 4 mL) by nebulization  two (2) times a day.     tamsulosin  0.4 mg capsule  Commonly known as: FLOMAX   Take 1 capsule (0.4 mg total) by mouth two (2) times a day.     TRELEGY ELLIPTA  200-62.5-25 mcg inhaler  Generic drug: fluticasone -umeclidin-vilanter  INHALE 1 PUFF IN THE MORNING              Allergies:  Latex  ______________________________________________________________________  Pending Test Results (if blank, then none):      Most Recent Labs:  All lab results last 24 hours -   Recent Results (from the past 24 hours)   POCT Glucose    Collection Time: 03/27/24 12:27 PM   Result Value Ref Range    Glucose, POC 163 70 - 179 mg/dL       Relevant Studies/Radiology (if blank, then none):  ECG 12 Lead  Result Date: 03/25/2024  SINUS TACHYCARDIA OTHERWISE NORMAL ECG WHEN COMPARED WITH ECG OF 30-Jul-2023 22:59, NO SIGNIFICANT CHANGE WAS FOUND Confirmed by Claudene Legions ([id number]) on 03/25/2024 10:41:20 AM    XR Chest 2 views  Result Date: 03/24/2024  EXAM: XR CHEST 2 VIEWS ACCESSION: [id number] UN REPORT DATE: 03/24/2024 8:48 PM CLINICAL INDICATION: CHEST PAIN ; Chest Pain  TECHNIQUE: PA and Lateral Chest Radiographs COMPARISON: Chest radiograph 07/30/2023, CT chest 09/30/2023 FINDINGS: Postsurgical sequela of left upper lobectomy and postradiation changes of the right lower lobe better imaged on CT chest dated 09/30/2023. Right apical pulmonary nodule is also better visualized on CT chest dated 09/30/2023. Background emphysema. Left-sided multifocal consolidations. No pleural effusion or pneumothorax. Normal heart size and mediastinal contours. Partially imaged cervical spinal hardware. Elevation of the left hemidiaphragm. Atherosclerotic changes.     Multifocal left-sided consolidations suggestive of infection. Postsurgical sequela of left upper lobectomy, right apical pulmonary nodule, and postradiation changes of the right lower lobe are better seen on CT chest dated 09/30/2023.    ______________________________________________________________________  Discharge Instructions:           Other Instructions       Discharge instructions      You were hospitalized for pneumonia and flare of COPD after getting the flu.  I am so glad you are feeling better.  These are your discharge instructions.  1.  Take an antibiotic called cefdinir  and doxycycline  starting tomorrow morning for 2 days.  Please stay upright for at least for 30 minutes after having the doxycycline  pill and do not take with iron or calcium .   2.  You will take 2 additional days of prednisone  tablet for your airway.  Sometimes this can cause your blood sugars to be high so I suggest trying to be careful about your sugar intake over the next 2 days.  3.  Please use your nebulizer machine if you are feeling short of breath or having wheezing as this will help.  4.  I have prescribed a swish and swallow called nystatin  that can be used for thrush.  5.  You can use oxygen as needed for low O2 levels less than 90%.     Patient return to her medical care if you develop severe fevers greater than 102 ??F, shaking chills, worsening of your breathing or respiratory status coughing up blood or other concerning symptoms.    I have made you a hospital follow-up clinic at the [facility] internal medicine clinic on January 20 at 950.  Please call and reschedule if this does not work for you.    Thanks for letting me be part of your  care team.  I am wishing you all the best.-Dr. Clem            Follow Up instructions and Outpatient Referrals     Ambulatory Referral to Home Health      Reason for referral: Home Health    Physician to follow patient's care: PCP Comment - Manuela Sharrell LABOR    Disciplines requested:  Physical Therapy  Occupational Therapy       Physical Therapy requested:  Strengthening exercises  Evaluate and treat       Occupational Therapy Requested:  Strengthening exercises  Evaluate and treat       Discharge instructions          Appointments which have been scheduled for you      Apr 01, 2024 9:50 AM  (Arrive by 9:35 AM)  OFFICE VISIT with Escher Tinnie Kawasaki, MD  [facility] SAME DAY CLINIC [place] [facility] REG[facility]) [address]  [facility] [address]  [place] [address]  [phone]        Sep 15, 2024 10:35 AM  (Arrive by 10:20 AM)  RETURN CONTINUITY with Sharrell LABOR Manuela, MD  [facility] INTERNAL MEDICINE [place] [facility] REGION) [address]  [facility] [address]  [place] [address]  [phone]             ______________________________________________________________________  Discharge Day Services:  BP 148/83  - Pulse 102  - Temp 36.7 ??C (98.1 ??F) (Oral)  - Resp 18  - Wt 58.1 kg (128 lb 1.4 oz)  - SpO2 95%  - BMI 21.31 kg/m??   Pt seen on the day of discharge and determined appropriate for discharge.    Condition at Discharge: stable    Length of Discharge: I spent greater than 30 mins in the discharge of this patient.

## 2024-03-28 NOTE — Progress Notes (Signed)
 Reason for Visit: Hospital Follow-up Medication Management    Assessment and Plan:     # Asthma-COPD overlap:   Continue Trelegy 1 puff daily  Continue albuterol , Duoneb PRN    # Medication Management   Reviewed the indication, dose, and frequency of each medication with patient.   Pt has history of tachycardia, HR of 115 bpm today; continue to monitor      Recommendations and medication-related problems were discussed directly with the patient's transitions of care physician, Dr. Kayla Pouch, immediately following the pharmacist visit prior to the physician visit. Questions/concerns were addressed to the patient's satisfaction. I spent a total of 20 minutes face to face with the patient delivering clinical care and providing education/counseling.      Lee Baxter, PharmD CPP  Clinical Pharmacist    ___________________________________________________     History of Present Illness:  Lee Baxter is a 79 y.o. male with a past medical history of COPD, asthma, hypertension, type 2 diabetes, lung cancer status post left lobectomy and radiation, and sensorineural hearing loss  who was recently hospitalized from 03/24/24 to 03/27/24 for LLL PNA 2/2 influenza A., COPD exacerbation. Pt presents to the [facility] for follow-up with all of his medication bottles. He presents with grandson, Lee Baxter.      At discharge,   Start  Cefdinir  300 mg 1 cap q12h x 2 days (EOT: 03/30/24)  Doxycycline  100 mg 1 tab BIC x 2 days (EOT: 03/30/24)  Nystatin  100,00 unit/mL 5 ML QID x 3 days (EOT: 03/30/24)  Prednisone  20 mg 2 tab (40 mg) daily x 2 days (EOT: 03/30/24)       Since discharge, patient feels he is doing well. No constipation, but some decreased appetite. He has not needed to use supplemental oxygen at home as pulse ox has not decr below 88%. Has not needed nebulizer or albuterol . His FBG this morning was 129 mg/dL.     Discrepancies noted in medication review: pt taking venlafaxine  37.5 mg daily, was removed from medication list as patient reported not taking    Medication Adherence and Access:  Missed doses?: no  Uses pillbox?: no  Anyone else assist with medication organization? yes  Current insurance coverage: [employer] and [place] Medicaid dual   Preferred [facility]: [facility]  Medications affordable?: yes  Needs refills? yes    Allergies:   Allergies[1]    Medications: Medications reviewed in EPIC medication station and updated today by the clinical pharmacist practitioner.  Medications Ordered Prior to Encounter[2]    Future Appointments   Date Time Provider Department Center   04/01/2024  9:50 AM Howard-Williams, Jacinta Maxwell, MD [facility]   09/15/2024 10:35 AM Manuela Sharrell LABOR, MD [facility]          [1]   Allergies  Allergen Reactions    Latex      Added based on information entered during case entry, please review and add reactions, type, and severity as needed   [2]   Current Outpatient Medications on File Prior to Visit   Medication Sig Dispense Refill    albuterol  HFA 90 mcg/actuation inhaler TAKE 2 PUFFS BY MOUTH EVERY 6 HOURS AS NEEDED FOR WHEEZE 8 g 5    amlodipine  (NORVASC ) 2.5 MG tablet Take 1 tablet (2.5 mg total) by mouth daily. 90 tablet 0    atorvastatin  (LIPITOR ) 40 MG tablet TAKE 1 TABLET BY MOUTH EVERY DAY IN THE EVENING 90  tablet 3    blood sugar diagnostic (ACCU-CHEK GUIDE TEST STRIPS) Strp Use to check blood sugar three times daily as instructed by clinic. E11.9 300 strip 3    blood-glucose meter kit Disp. blood glucose meter kit preferred by patient's insurance. Dx: Diabetes, E11.9 1 each 11    cefdinir  (OMNICEF ) 300 MG capsule Take 1 capsule (300 mg total) by mouth every twelve (12) hours for 2 days. 4 capsule 0    CYANOCOBALAMIN , VITAMIN B-12, ORAL Take 1 tablet/capsule by mouth daily.      doxycycline  (VIBRA -TABS) 100 MG tablet Take 1 tablet (100 mg total) by mouth two (2) times a day for 2 days. 4 tablet 0    dupilumab  (DUPIXENT ) 300 mg/2 mL pen injector Inject the contents of 1 pen (300 mg) under the skin every fourteen (14) days. 4 mL 11    empty container Misc Use as directed to dispose of Dupixent  pens 1 each 2    EPINEPHrine  (EPIPEN ) 0.3 mg/0.3 mL injection Inject 0.3 mL (0.3 mg total) into the muscle once as needed for anaphylaxis for up to 1 dose. 1 each 0    finasteride  (PROSCAR ) 5 mg tablet Take 1 tablet (5 mg total) by mouth daily. 90 tablet 3    ipratropium-albuterol  (DUO-NEB) 0.5-2.5 mg/3 mL nebulizer Inhale 1 vial ( 3 mL) by nebulization every six (6) hours. Use as directed 1080 mL 1    lancets Misc Disp. lancets #100 or amount allowed, Testing BID. Dx: E11.9,Z79.4 (DM Type 2- controlled, insulin  dep) 100 each 11    MEDICAL SUPPLY ITEM Pt has DM hx of neuropathy w/ callus. Followed for comprehensive care. For med records, fax release to [phone]. 2 Units 0    MEDICAL SUPPLY ITEM 1 pair of diabetic shoes and 3 pairs of inserts.  Pt has DM with neuropathy w/ callus. Followed for comprehensive care. For med records, fax release to [phone] 2 each 0    metFORMIN  (GLUCOPHAGE ) 500 MG tablet Take 1 tablet (500 mg total) by mouth in the morning and 1 tablet (500 mg total) in the evening. Take with meals. 180 tablet 2    nystatin  (MYCOSTATIN ) 100,000 unit/mL suspension Take 5 mL (500,000 Units total) by mouth four (4) times a day for 3 days. 60 mL 0    predniSONE  (DELTASONE ) 20 MG tablet Take 2 tablets (40 mg total) by mouth in the morning for 2 days. 4 tablet 0    sodium chloride  3 % NEBULIZER solution Inhale 1 vial ( 4 mL) by nebulization two (2) times a day. 720 mL 3    tamsulosin  (FLOMAX ) 0.4 mg capsule Take 1 capsule (0.4 mg total) by mouth two (2) times a day. 180 capsule 3    TRELEGY ELLIPTA  200-62.5-25 mcg inhaler INHALE 1 PUFF IN THE MORNING 60 each 5     No current facility-administered medications on file prior to visit.

## 2024-03-31 NOTE — Progress Notes (Signed)
 Internal Medicine Hospital Follow-Up Visit    ASSESSMENT / PLAN:  1. Hospital discharge follow-up    2. Primary hypertension    3. Type 2 diabetes mellitus with diabetic polyneuropathy, without long-term current use of insulin  (CMS-HCC)    4. Benign prostatic hyperplasia with incomplete bladder emptying    5. Stage 3a chronic kidney disease (CMS-HCC)    6. Cervicalgia    7. Asthma-COPD overlap syndrome    (CMS-HCC)    8. Diabetes mellitus type 2 without retinopathy (CMS-HCC)    9. History of lung cancer    10. Hyperlipidemia, unspecified hyperlipidemia type    11. Pneumonia of left lower lobe due to infectious organism        Lee Baxter. Keltner is an 79 year old male with a history of COPD, asthma, hypertension, type 2 diabetes, lung cancer status post left lobectomy and radiation, and sensorineural hearing loss who was admitted with left lower lobe pneumonia following recent influenza A infection. He presents for a hospital follow up.    Pneumonia of Left Lung (Bacterial Superinfection Post-Influenza)   COPD Exacerbation  History of asthma COPD overlap  Negative MRSA screen. He required supplemental oxygen (up to 4L for desaturation events, then weaned to 2L), and was treated with systemic steroids and scheduled nebulized bronchodilators. He was able to ambulate with a quad cane.  Home O2 (already set up from prior hospitalization) for longer ambulation and monitor pulse ox.  He was discharged with home health services arranged.    -cefdinir  and doxycycline  and prednisone  x2 days post d/c - finished  -Duo neb prn - has not needed it in the past few days  -Trelegy  -Dupixent  (last dose yesterday)  -Will have home health call daughter to set up any further needed services as he states he does not answer his phone    Depression  Per notes pt did not want to take venlafaxine , however, it was in his med bag  -Venlafaxine  37.5 qd    Hyperglycemia   Developed hyperglycemia in the setting of steroid use.  This AM 129 fasting  -Metformin  500 bid    Intertrigo  -antifungal powder was applied to the groin. Both wounds were stable at discharge, with no evidence of infection or new skin breakdown.     Persistent Tachycardia  I don't see a formal diagnosis but seems persistent for many years  -Follow up with PCP    CKD stage 3a  2/2 diabetes and hypertension     Discharge Planning   -home health services for continued physical and occupational therapy, wound care, and monitoring of respiratory and functional status.     HLD  HTN  -Atorvastatin  40  -Norvasc  2.5    BPH  -Finasteride  5 mg  -Flomax  0.4 bid - pt does not feel bid dosing helps    B12 deficiency  -B12 1000  mcg qd    History of RLL small cell lung carcinoma  Not pathologically confirmed, status post RT       Patient provided with an updated and reconciled medications list.    Follow Up: No follow-ups on file.  Future Appointments   Date Time Provider Department Center   09/15/2024 10:35 AM Manuela Sharrell LABOR, MD [facility]     Medication adherence and barriers to the treatment plan have been addressed. Opportunities to optimize healthy behaviors have been discussed. Patient / caregiver voiced understanding.      CHIEF COMPLAINT/HISTORY OF PRESENT ILLNESS:   Hospital Follow-Up for Hospitalization  Follow-up    History obtained from: Family member and Patient - grandson  Date of Hospitalization Discharge:  03/27/24     HPI:      Lee Baxter is an 79 year old male who presents for follow-up after recent hospitalization.    He is experiencing improved mobility and stable oxygen levels. He prefers outpatient physical therapy over home health services and is in the process of finding a nearby facility.    He has no labored breathing or nasal congestion. He experiences occasional coughing with clear sputum and no hemoptysis. He has not used his nebulizer or rescue inhaler recently, relying on Trelegy for respiratory needs.    His appetite is not great but he expects improvement since discontinuing certain medications. He is managing a groin rash with powder prescribed during his hospital stay, applying it twice daily, and reports having enough supply.    He takes Proscar  and Flomax  twice daily for urinary symptoms but feels that the Flomax  may not be helping much. He reports urinating well, especially after drinking water .    No recent falls, diarrhea, nausea, or vomiting. He is taking venlafaxine  daily, although he is unsure of its effects.        MEDICATIONS AND ALLERGIES:   Reviewed and updated in Epic.  Medication and allergy review was completed by the pharmacist and discussed during this visit. Please see their note within this encounter.    SOCIAL/FAMILY HISTORY:  Social History:  Reviewed in Epic.  Biopsychosocial barriers to care were addressed by the social worker this visit.     REVIEW OF SYSTEMS:    All other systems reviewed are negative except as noted here or in HPI.    PHYSICAL EXAM  Vitals:    Vitals:    04/01/24 0938   BP: 123/80   Pulse: 115   Resp: 16   SpO2: 95%       Exam:  General:  NAD  Lungs:  Inspiratory wheezes and faint crackles RLL, otherwise largely clear with decreased air movement  Heart:  Tachycardic  Abd: NTTP, NABS  Ext:  No c/c/e    Labs:  Reviewed from discharge.  See Epic labs.   Radiology: Reviewed radiology studies from discharge. See in Epic.   Reviewed: Discharge summary, Labs, and Imaging (e.g. xrays, CT, etc); See results in Epic  Discussed care with: Social worker and Forensic Psychologist by phone or other method (Encounter type: Patient Outreach):     Patient Outreach History (Since 03/18/2024)       Transition of Care       Date Method of Outreach Associated Actions User Next Outreach    03/28/2024 11:21 AM Telephone  Canda, Harlene PARAS, RN                    Successful contact made or 2 unsuccessful attempts within 2 business days    SUMMARY OF ASSOCIATED HOSPITALIZATION    Lee MALVA. Roza is an 79 year old male with a history of COPD, asthma, hypertension, type 2 diabetes, lung cancer status post left lobectomy and radiation, and sensorineural hearing loss who was admitted with left lower lobe pneumonia following recent influenza A infection, presenting with worsening dyspnea, weakness, and cough.    Pneumonia of Left Lung (Bacterial Superinfection Post-Influenza) and COPD Exacerbation, history of asthma COPD overlap  He presented with 10 days of flu-like symptoms, followed by worsening dyspnea and weakness. Imaging revealed a left lower lobe infiltrate consistent with pneumonia,  and he was started on cefepime  and vancomycin  due to concern pressure. Vancomycin  was discontinued after a negative MRSA screen. He required supplemental oxygen (up to 4L for desaturation events, then weaned to 2L), and was treated with systemic steroids and scheduled nebulized bronchodilators. By discharge, he reported significant improvement in respiratory symptoms and was able to ambulate with a quad cane,  maintaining adequate saturations at rest and with exertion for short distances.  He already has home oxygen from prior instructed to use oxygen as needed for longer ambulation and monitor pulse ox.  He was discharged with home health services arranged.  The patient had no history of Pseudomonas infection, was discharged with cefdinir  and doxycycline  and prednisone  to complete treatment for community-acquired pneumonia and COPD flare.  He has nebulizer treatments at home and was instructed to use as needed for ongoing    Hyperglycemia   Developed hyperglycemia in the setting of steroid use.  Correctional insulin  and NPH were initiated in addition to his home metformin .  In only 2 additional days will be needed, was not sent home with insulin  on discharge and will resume prior regimen.    Skin Integrity: Wounds and Moisture-Associated Skin Damage   He was noted to have bilateral groin consistent with a fungal rash, likely exacerbated by prior urinary incontinence. antifungal powder was applied to the groin. Both wounds were stable at discharge, with no evidence of infection or new skin breakdown.      Discharge Planning   He was discharged home with home health services for continued physical and occupational therapy, wound care, and monitoring of respiratory and functional status. His daughter and grandson will provide additional support at home. He has an advance directive in place and is DNR/DNI.

## 2024-04-01 DIAGNOSIS — C3490 Malignant neoplasm of unspecified part of unspecified bronchus or lung: Principal | ICD-10-CM

## 2024-04-01 DIAGNOSIS — E785 Hyperlipidemia, unspecified: Principal | ICD-10-CM

## 2024-04-01 DIAGNOSIS — M542 Cervicalgia: Principal | ICD-10-CM

## 2024-04-01 DIAGNOSIS — E119 Type 2 diabetes mellitus without complications: Principal | ICD-10-CM

## 2024-04-01 DIAGNOSIS — R3914 Feeling of incomplete bladder emptying: Principal | ICD-10-CM

## 2024-04-01 DIAGNOSIS — J4489 Asthma-COPD overlap syndrome    (CMS-HCC): Principal | ICD-10-CM

## 2024-04-01 DIAGNOSIS — Z09 Encounter for follow-up examination after completed treatment for conditions other than malignant neoplasm: Principal | ICD-10-CM

## 2024-04-01 DIAGNOSIS — N1831 Stage 3a chronic kidney disease (CMS-HCC): Principal | ICD-10-CM

## 2024-04-01 DIAGNOSIS — Z85118 Personal history of other malignant neoplasm of bronchus and lung: Principal | ICD-10-CM

## 2024-04-01 DIAGNOSIS — E1142 Type 2 diabetes mellitus with diabetic polyneuropathy: Principal | ICD-10-CM

## 2024-04-01 DIAGNOSIS — N401 Enlarged prostate with lower urinary tract symptoms: Principal | ICD-10-CM

## 2024-04-01 DIAGNOSIS — J189 Pneumonia, unspecified organism: Principal | ICD-10-CM

## 2024-04-01 DIAGNOSIS — I10 Essential (primary) hypertension: Principal | ICD-10-CM

## 2024-04-01 MED ORDER — VENLAFAXINE ER 37.5 MG CAPSULE,EXTENDED RELEASE 24 HR
ORAL_CAPSULE | Freq: Every day | ORAL | 0 refills | 90.00000 days
Start: 2024-04-01 — End: ?

## 2024-04-01 MED ORDER — ATORVASTATIN 40 MG TABLET
ORAL_TABLET | Freq: Every evening | ORAL | 3 refills | 90.00000 days | Status: CP
Start: 2024-04-01 — End: ?

## 2024-04-01 MED ORDER — AMLODIPINE 2.5 MG TABLET
ORAL_TABLET | Freq: Every day | ORAL | 0 refills | 90.00000 days | Status: CP
Start: 2024-04-01 — End: 2024-06-30

## 2024-04-01 NOTE — Patient Instructions (Addendum)
 HOSPITAL DISCHARGE FOLLOW-UP: Your recovery is progressing well with stable oxygen levels and comfortable breathing. You have no recent falls, diarrhea, nausea, vomiting, nasal congestion, or hemoptysis. Your appetite is expected to improve after discontinuing certain medications.  -Continue your current medications and monitor your symptoms.  -Follow up in three months.    ASTHMA-COPD OVERLAP SYNDROME: Your breathing is comfortable, and you have not needed to use your nebulizer or rescue inhaler recently. You are continuing to use the Trelegy inhaler.  -Continue using the Trelegy inhaler as prescribed.    BENIGN PROSTATIC HYPERPLASIA WITH LOWER URINARY TRACT SYMPTOMS: You feel that Flomax  twice daily is not very helpful. We discussed reducing the dose to once daily to see if it is effective. Do not stop taking it abruptly.  -Reduce Flomax  to once daily and monitor your urinary symptoms, if desired.    TYPE 2 DIABETES MELLITUS: Your blood sugar levels are well-controlled, and your appetite is expected to improve after discontinuing certain medications.  -Continue your current diabetes management plan.    INTERTRIGO: Your groin rash has improved with the use of the prescribed powder.  -Continue applying the powder to the affected area twice daily.    PHYSICAL THERAPY  -You have been given a prescription for PT outpatient

## 2024-04-01 NOTE — Progress Notes (Signed)
 [facility] at [facility]     Reason for visit: Hospital Follow up    Questions / Concerns that need to be addressed:     Screening BP- 123/80      PTHomeBP           Dexcom or Libre CGM in use? If so, pull appropriate reporting through portal (Dexcom) or EPIC order [name]).    HCDM reviewed and updated in Epic:    We are working to make sure all of our patients??? wishes are updated in Epic and part of that is documenting a Environmental Health Practitioner for each patient  A Health Care Decision Maker is someone you choose who can make health care decisions for you if you are not able - who would you most want to do this for you????  is already up to date.    HCDM (patient stated preference): Lee Baxter - Daughter - [phone]    BPAs completed:  PHQ2    Annual Screenings:     __________________________________________________________________________________________    SCREENINGS COMPLETED IN FLOWSHEETS      AUDIT       PHQ2  PHQ-2 Total Score : 0    PHQ9          GAD7       COPD Assessment       Falls Risk

## 2024-04-01 NOTE — Progress Notes (Signed)
 HOSPITAL FOLLOW UP SOCIAL WORK ASSESSMENT:     Licensed Clinical Social Worker (SW) met face to face with patient during their hospital follow-up appointment on 04/01/2024 to assess for care management needs.    Is there someone else in the room? Yes. What is your relationship? Grandson . Do you want this person here for the visit? Yes .    Hospital Course- Lee Baxter. Lee Baxter is an 79 year old male with a history of COPD, asthma, hypertension, type 2 diabetes, lung cancer status post left lobectomy and radiation, and sensorineural hearing loss who was admitted with left lower lobe pneumonia following recent influenza A infection, presenting with worsening dyspnea, weakness, and cough.    Pneumonia of Left Lung (Bacterial Superinfection Post-Influenza) and COPD Exacerbation, history of asthma COPD overlap  He presented with 10 days of flu-like symptoms, followed by worsening dyspnea and weakness. Imaging revealed a left lower lobe infiltrate consistent with pneumonia, and he was started on cefepime  and vancomycin  due to concern pressure. Vancomycin  was discontinued after a negative MRSA screen. He required supplemental oxygen (up to 4L for desaturation events, then weaned to 2L), and was treated with systemic steroids and scheduled nebulized bronchodilators. By discharge, he reported significant improvement in respiratory symptoms and was able to ambulate with a quad cane,  maintaining adequate saturations at rest and with exertion for short distances.  He already has home oxygen from prior instructed to use oxygen as needed for longer ambulation and monitor pulse ox.  He was discharged with home health services arranged.  The patient had no history of Pseudomonas infection, was discharged with cefdinir  and doxycycline  and prednisone  to complete treatment for community-acquired pneumonia and COPD flare.  He has nebulizer treatments at home and was instructed to use as needed for ongoing       Is patient able to participate in visit assessment?  Yes       How is patients Mobility? Uses a cane when he walks   Are they independent with ADLS? Family helps with meals, pt is able to dress self and shower       Home Health [facility]) Care :   does  have home health services  Current Home Health Agency:  Current services: PT  New order needed?: pt had a referral to [facility] for PT , voices she has not heard from them but admitted he does not answer the phone. Phone call to [facility] to make sure they call dtr Diane. Per [place], they were able to reach dtr and they declined HH,preferring OP services.  Will ask Provider for hard script for OP PT for pt to take with him. Darden is agreeable.     Durable Medical Equipment (DME):   Patient reports use of cane, oxygen, and walker.    DME Agency:[facility]   Denies need for repair on existing equipment    denies need for additional equipment at this time.   New order needed?: no        Personal Care Service/Personal Aide [facility]):   does not have personal care services/personal aide support and denies need for support.    PCS Agency:         Specialist Follow-up:  Future Appointments   Date Time Provider Department Center   09/15/2024 10:35 AM Manuela Sharrell LABOR, MD [facility]         Housing and Aftercare Support:    Patient lives alone in a House [place].  He denies issues with home safety.    The patient receives support from his daughter.     Depression Screening:  PHQ-2 Score: 0  PHQ-9 Score:              Social Determinants of Health Screening: pt denies any SDOH needs at this time.   Pt has dtr drive him to appts and family gets his groceries for him.         Social Drivers of Health     Food Insecurity: No Food Insecurity (03/26/2024)    Hunger Vital Sign     Worried About Running Out of Food in the Last Year: Never true     Ran Out of Food in the Last Year: Never true   Tobacco Use: High Risk (04/01/2024)    Patient History     Smoking Tobacco Use: Former     Smokeless Tobacco Use: Current     Passive Exposure: Not on file   Transportation Needs: No Transportation Needs (03/26/2024)    PRAPARE - Therapist, Art (Medical): No     Lack of Transportation (Non-Medical): No   Alcohol Use: Not At Risk (07/30/2023)    Alcohol Use     How often do you have a drink containing alcohol?: Never     How many drinks containing alcohol do you have on a typical day when you are drinking?: 1 - 2     How often do you have 5 or more drinks on one occasion?: Never   Housing: Low Risk (03/26/2024)    Housing     Within the past 12 months, have you ever stayed: outside, in a car, in a tent, in an overnight shelter, or temporarily in someone else's home (i.e. couch-surfing)?: No     Are you worried about losing your housing?: No   Physical Activity: Not on file   Utilities: Low Risk (03/26/2024)    Utilities     Within the past 12 months, have you been unable to get utilities (heat, electricity) when it was really needed?: No   Stress: Not on file   Interpersonal Safety: Not At Risk (03/24/2024)    Interpersonal Safety     Unsafe Where You Currently Live: No     Physically Hurt by Anyone: No     Abused by Anyone: No   Substance Use: Low Risk (07/30/2023)    Substance Use     In the past year, how often have you used prescription drugs for non-medical reasons?: Never     In the past year, how often have you used illegal drugs?: Never     In the past year, have you used any substance for non-medical reasons?: No   Intimate Partner Violence: Not At Risk (07/30/2023)    Humiliation, Afraid, Rape, and Kick questionnaire     Fear of Current or Ex-Partner: No     Emotionally Abused: No     Physically Abused: No     Sexually Abused: No   Social Connections: Not on file   Financial Resource Strain: Low Risk (03/26/2024)    Overall Financial Resource Strain (CARDIA)     Difficulty of Paying Living Expenses: Not hard at all   Health Literacy: Low Risk (04/30/2023) Health Literacy     : Never   Internet Connectivity: No Internet connectivity concern identified (04/30/2023)    Internet Connectivity     Do you have access to internet services: Yes  How do you connect to the internet: Personal Device at home     Is your internet connection strong enough for you to watch video on your device without major problems?: Yes     Do you have enough data to get through the month?: Yes     Does at least one of the devices have a camera that you can use for video chat?: Yes         Upcoming appointments:  Future Appointments   Date Time Provider Department Center   09/15/2024 10:35 AM Manuela Sharrell LABOR, MD [facility]       Reviewed upcoming clinic appointment(s): Yes.  Plans to keep appointment(s): Yes.  Transportation assistance needed: No    ------------------------------------------------------------------------------------------------------------------------------------------   Visit discussed with Mliss Null, PharmD, CPP and Escher Kayla Pouch , MD who will also see patient during their HFU visit.      Recommendations and SDOH related problems were discussed directly with the patient's transitions of care physician, Dr. Kayla Pouch, immediately following the Social Work Care Manager visit prior to the physician visit. Questions/concerns were addressed to the patient's satisfaction. I spent a total of 15 minutes with the patient delivering care and providing education/counseling/resources        Family, social and cultural characteristics were assessed during the visit and plan.     Patient was informed they can call the clinic at [phone] with any additional questions or concerns.

## 2024-04-07 NOTE — Progress Notes (Signed)
 [facility] Refill Coordination Note    Specialty Medication(s) to be Shipped:   CF/Pulmonary/Asthma: Dupixent     Other medication(s) to be shipped: No additional medications requested for fill at this time    Specialty Medications not needed at this time: N/A     Lee Baxter, DOB: [birth year]  Phone: [phone] (home) [phone] (work)      All above HIPAA information was verified with patient's family member, [name].     Was a nurse, learning disability used for this call? No    Completed refill call assessment today to schedule patient's medication shipment from the [facility]  [phone]).  All relevant notes have been reviewed.     Specialty medication(s) and dose(s) confirmed: Regimen is correct and unchanged.   Changes to medications: [name] reports no changes at this time.  Changes to insurance: No  New side effects reported not previously addressed with a pharmacist or physician: None reported  Questions for the pharmacist: No    Confirmed patient received a Conservation Officer, [name], [name] with first shipment. The patient will receive a drug information handout for each medication shipped and additional FDA Medication Guides as required.       DISEASE/MEDICATION-SPECIFIC INFORMATION        N/A    SPECIALTY MEDICATION ADHERENCE     Medication Adherence    Patient reported X missed doses in the last month: 0  Specialty Medication: DUPIXENT  PEN 300 mg/2 mL pen injector (dupilumab )  Patient is on additional specialty medications: No  Patient is on more than two specialty medications: No  Any gaps in refill history greater than 2 weeks in the last 3 months: no  Demonstrates understanding of importance of adherence: yes  Informant: child/children              Were doses missed due to medication being on hold? No    Dupixent  300/2 mg/ml: 1 doses of medicine on hand     Specialty medication is an injection or given on a cycle: Yes, Next injection is scheduled for 1/27.    REFERRAL TO PHARMACIST     Referral to the pharmacist: Not needed      [facility]     Shipping address confirmed in Epic.     Cost and Payment: Patient has a copay of $4. They are aware and have authorized the pharmacy to charge the credit card on file.    Delivery Scheduled: Yes, Expected medication delivery date: 04/16/24.     Medication will be delivered via Next Day Courier to the prescription address in Epic WAM.    [name], PharmD   [facility]  Specialty Pharmacist

## 2024-04-08 DIAGNOSIS — M542 Cervicalgia: Principal | ICD-10-CM

## 2024-04-08 MED ORDER — VENLAFAXINE ER 37.5 MG CAPSULE,EXTENDED RELEASE 24 HR
ORAL_CAPSULE | Freq: Every day | ORAL | 0 refills | 90.00000 days
Start: 2024-04-08 — End: ?

## 2024-04-15 DIAGNOSIS — M542 Cervicalgia: Principal | ICD-10-CM

## 2024-04-15 MED ORDER — VENLAFAXINE ER 37.5 MG CAPSULE,EXTENDED RELEASE 24 HR
ORAL_CAPSULE | Freq: Every day | ORAL | 0 refills | 0.00000 days
Start: 2024-04-15 — End: ?

## 2024-04-15 MED FILL — DUPIXENT 300 MG/2 ML SUBCUTANEOUS PEN INJECTOR: SUBCUTANEOUS | 28 days supply | Qty: 4 | Fill #6

## 2024-04-16 MED ORDER — VENLAFAXINE ER 37.5 MG CAPSULE,EXTENDED RELEASE 24 HR
ORAL_CAPSULE | Freq: Every day | ORAL | 0 refills | 90.00000 days | Status: CP
Start: 2024-04-16 — End: ?
